# Patient Record
Sex: Female | Born: 1940 | Race: White | Hispanic: No | Marital: Single
Health system: Southern US, Community
[De-identification: ages and names within clinical notes are randomized; demographics above are authoritative.]

## PROBLEM LIST (undated history)

## (undated) DIAGNOSIS — I4891 Unspecified atrial fibrillation: Secondary | ICD-10-CM

## (undated) DIAGNOSIS — I6529 Occlusion and stenosis of unspecified carotid artery: Secondary | ICD-10-CM

## (undated) DIAGNOSIS — F419 Anxiety disorder, unspecified: Secondary | ICD-10-CM

## (undated) DIAGNOSIS — I8393 Asymptomatic varicose veins of bilateral lower extremities: Secondary | ICD-10-CM

## (undated) DIAGNOSIS — Z7901 Long term (current) use of anticoagulants: Secondary | ICD-10-CM

## (undated) DIAGNOSIS — I639 Cerebral infarction, unspecified: Secondary | ICD-10-CM

## (undated) DIAGNOSIS — Z972 Presence of dental prosthetic device (complete) (partial): Secondary | ICD-10-CM

## (undated) DIAGNOSIS — J449 Chronic obstructive pulmonary disease, unspecified: Secondary | ICD-10-CM

## (undated) DIAGNOSIS — M109 Gout, unspecified: Secondary | ICD-10-CM

## (undated) DIAGNOSIS — E119 Type 2 diabetes mellitus without complications: Secondary | ICD-10-CM

## (undated) DIAGNOSIS — I509 Heart failure, unspecified: Secondary | ICD-10-CM

## (undated) DIAGNOSIS — Z974 Presence of external hearing-aid: Secondary | ICD-10-CM

## (undated) DIAGNOSIS — I10 Essential (primary) hypertension: Secondary | ICD-10-CM

## (undated) DIAGNOSIS — N289 Disorder of kidney and ureter, unspecified: Secondary | ICD-10-CM

## (undated) DIAGNOSIS — J40 Bronchitis, not specified as acute or chronic: Secondary | ICD-10-CM

## (undated) DIAGNOSIS — E785 Hyperlipidemia, unspecified: Secondary | ICD-10-CM

## (undated) DIAGNOSIS — Z9889 Other specified postprocedural states: Secondary | ICD-10-CM

## (undated) DIAGNOSIS — K08109 Complete loss of teeth, unspecified cause, unspecified class: Secondary | ICD-10-CM

## (undated) DIAGNOSIS — M1990 Unspecified osteoarthritis, unspecified site: Secondary | ICD-10-CM

## (undated) DIAGNOSIS — R112 Nausea with vomiting, unspecified: Secondary | ICD-10-CM

## (undated) DIAGNOSIS — I679 Cerebrovascular disease, unspecified: Secondary | ICD-10-CM

## (undated) DIAGNOSIS — I2510 Atherosclerotic heart disease of native coronary artery without angina pectoris: Secondary | ICD-10-CM

## (undated) HISTORY — DX: Type 2 diabetes mellitus without complications: E11.9

## (undated) HISTORY — PX: PARTIAL HYSTERECTOMY: SHX80

## (undated) HISTORY — DX: Essential (primary) hypertension: I10

## (undated) HISTORY — PX: CARDIAC CATHETERIZATION: SHX172

## (undated) HISTORY — PX: TOE SURGERY: SHX1073

## (undated) HISTORY — DX: Atherosclerotic heart disease of native coronary artery without angina pectoris: I25.10

---

## 2005-07-20 HISTORY — PX: TOTAL KNEE ARTHROPLASTY: SHX125

## 2006-02-10 ENCOUNTER — Other Ambulatory Visit: Payer: Self-pay

## 2006-03-23 ENCOUNTER — Inpatient Hospital Stay: Payer: Self-pay | Admitting: Unknown Physician Specialty

## 2007-03-15 ENCOUNTER — Ambulatory Visit: Payer: Self-pay | Admitting: Family Medicine

## 2009-07-20 HISTORY — PX: BREAST BIOPSY: SHX20

## 2010-04-11 ENCOUNTER — Ambulatory Visit: Payer: Self-pay | Admitting: Family Medicine

## 2010-06-11 ENCOUNTER — Ambulatory Visit: Payer: Self-pay | Admitting: Family Medicine

## 2011-01-08 ENCOUNTER — Ambulatory Visit: Payer: Self-pay | Admitting: General Surgery

## 2012-02-24 ENCOUNTER — Ambulatory Visit: Payer: Self-pay | Admitting: Family Medicine

## 2012-06-02 ENCOUNTER — Ambulatory Visit: Payer: Self-pay | Admitting: Physical Medicine and Rehabilitation

## 2013-03-01 ENCOUNTER — Ambulatory Visit: Payer: Self-pay | Admitting: Family Medicine

## 2013-11-10 DIAGNOSIS — E119 Type 2 diabetes mellitus without complications: Secondary | ICD-10-CM | POA: Insufficient documentation

## 2013-11-10 DIAGNOSIS — I4891 Unspecified atrial fibrillation: Secondary | ICD-10-CM | POA: Diagnosis present

## 2013-11-10 DIAGNOSIS — I495 Sick sinus syndrome: Secondary | ICD-10-CM | POA: Insufficient documentation

## 2013-11-10 DIAGNOSIS — I10 Essential (primary) hypertension: Secondary | ICD-10-CM | POA: Diagnosis present

## 2013-11-10 HISTORY — DX: Unspecified atrial fibrillation: I48.91

## 2013-11-10 HISTORY — DX: Sick sinus syndrome: I49.5

## 2013-12-01 DIAGNOSIS — M51369 Other intervertebral disc degeneration, lumbar region without mention of lumbar back pain or lower extremity pain: Secondary | ICD-10-CM | POA: Diagnosis present

## 2013-12-01 DIAGNOSIS — M5136 Other intervertebral disc degeneration, lumbar region: Secondary | ICD-10-CM

## 2013-12-01 DIAGNOSIS — M5416 Radiculopathy, lumbar region: Secondary | ICD-10-CM | POA: Insufficient documentation

## 2013-12-01 HISTORY — DX: Other intervertebral disc degeneration, lumbar region without mention of lumbar back pain or lower extremity pain: M51.369

## 2013-12-01 HISTORY — DX: Other intervertebral disc degeneration, lumbar region: M51.36

## 2014-05-01 ENCOUNTER — Ambulatory Visit: Payer: Self-pay | Admitting: Family Medicine

## 2016-10-23 ENCOUNTER — Other Ambulatory Visit: Payer: Self-pay | Admitting: Family Medicine

## 2016-10-23 DIAGNOSIS — Z1231 Encounter for screening mammogram for malignant neoplasm of breast: Secondary | ICD-10-CM

## 2016-11-16 ENCOUNTER — Ambulatory Visit
Admission: RE | Admit: 2016-11-16 | Discharge: 2016-11-16 | Disposition: A | Discharge status: Home / Self Care | Payer: Medicare Other | Source: Ambulatory Visit | Attending: Family Medicine | Admitting: Family Medicine

## 2016-11-16 DIAGNOSIS — Z1231 Encounter for screening mammogram for malignant neoplasm of breast: Secondary | ICD-10-CM | POA: Diagnosis present

## 2017-12-27 ENCOUNTER — Other Ambulatory Visit: Payer: Self-pay | Admitting: Family Medicine

## 2017-12-27 DIAGNOSIS — Z1231 Encounter for screening mammogram for malignant neoplasm of breast: Secondary | ICD-10-CM

## 2018-01-17 ENCOUNTER — Ambulatory Visit
Admission: RE | Admit: 2018-01-17 | Discharge: 2018-01-17 | Disposition: A | Discharge status: Home / Self Care | Payer: Medicare Other | Source: Ambulatory Visit | Attending: Family Medicine | Admitting: Family Medicine

## 2018-01-17 DIAGNOSIS — Z1231 Encounter for screening mammogram for malignant neoplasm of breast: Secondary | ICD-10-CM

## 2019-01-27 ENCOUNTER — Other Ambulatory Visit: Payer: Self-pay | Admitting: Family Medicine

## 2019-01-27 DIAGNOSIS — Z1231 Encounter for screening mammogram for malignant neoplasm of breast: Secondary | ICD-10-CM

## 2019-03-07 ENCOUNTER — Ambulatory Visit
Admission: RE | Admit: 2019-03-07 | Discharge: 2019-03-07 | Disposition: A | Discharge status: Home / Self Care | Payer: Medicare Other | Source: Ambulatory Visit | Attending: Family Medicine | Admitting: Family Medicine

## 2019-03-07 DIAGNOSIS — Z1231 Encounter for screening mammogram for malignant neoplasm of breast: Secondary | ICD-10-CM | POA: Diagnosis not present

## 2019-07-21 DIAGNOSIS — Z9841 Cataract extraction status, right eye: Secondary | ICD-10-CM

## 2019-07-21 HISTORY — DX: Cataract extraction status, right eye: Z98.41

## 2020-02-06 ENCOUNTER — Other Ambulatory Visit: Payer: Self-pay | Admitting: Family Medicine

## 2020-02-06 DIAGNOSIS — Z1231 Encounter for screening mammogram for malignant neoplasm of breast: Secondary | ICD-10-CM

## 2020-03-07 ENCOUNTER — Ambulatory Visit
Admission: RE | Admit: 2020-03-07 | Discharge: 2020-03-07 | Disposition: A | Discharge status: Home / Self Care | Payer: Medicare Other | Source: Ambulatory Visit | Attending: Family Medicine | Admitting: Family Medicine

## 2020-03-07 ENCOUNTER — Other Ambulatory Visit: Payer: Self-pay

## 2020-03-07 DIAGNOSIS — Z1231 Encounter for screening mammogram for malignant neoplasm of breast: Secondary | ICD-10-CM | POA: Diagnosis present

## 2020-03-20 ENCOUNTER — Encounter: Payer: Self-pay | Admitting: Ophthalmology

## 2020-03-20 ENCOUNTER — Other Ambulatory Visit: Payer: Self-pay

## 2020-03-22 ENCOUNTER — Other Ambulatory Visit: Admission: RE | Admit: 2020-03-22 | Payer: Medicare Other | Source: Ambulatory Visit

## 2020-03-26 ENCOUNTER — Other Ambulatory Visit: Payer: Self-pay

## 2020-03-26 ENCOUNTER — Other Ambulatory Visit
Admission: RE | Admit: 2020-03-26 | Discharge: 2020-03-26 | Disposition: A | Discharge status: Home / Self Care | Payer: Medicare Other | Source: Ambulatory Visit | Attending: Ophthalmology | Admitting: Ophthalmology

## 2020-03-26 DIAGNOSIS — Z01812 Encounter for preprocedural laboratory examination: Secondary | ICD-10-CM | POA: Insufficient documentation

## 2020-03-26 DIAGNOSIS — Z20822 Contact with and (suspected) exposure to covid-19: Secondary | ICD-10-CM | POA: Diagnosis not present

## 2020-03-26 NOTE — Discharge Instructions (Signed)

## 2020-03-27 ENCOUNTER — Encounter: Payer: Self-pay | Admitting: Ophthalmology

## 2020-03-27 ENCOUNTER — Other Ambulatory Visit: Payer: Self-pay

## 2020-03-27 ENCOUNTER — Ambulatory Visit: Payer: Medicare Other | Admitting: Anesthesiology

## 2020-03-27 ENCOUNTER — Ambulatory Visit
Admission: RE | Admit: 2020-03-27 | Discharge: 2020-03-27 | Disposition: A | Discharge status: Home / Self Care | Payer: Medicare Other | Attending: Ophthalmology | Admitting: Ophthalmology

## 2020-03-27 ENCOUNTER — Encounter
Admission: RE | Disposition: A | Discharge status: Home / Self Care | Payer: Self-pay | Source: Home / Self Care | Attending: Ophthalmology

## 2020-03-27 DIAGNOSIS — M1990 Unspecified osteoarthritis, unspecified site: Secondary | ICD-10-CM | POA: Insufficient documentation

## 2020-03-27 DIAGNOSIS — Z79899 Other long term (current) drug therapy: Secondary | ICD-10-CM | POA: Insufficient documentation

## 2020-03-27 DIAGNOSIS — Z96659 Presence of unspecified artificial knee joint: Secondary | ICD-10-CM | POA: Insufficient documentation

## 2020-03-27 DIAGNOSIS — Z7982 Long term (current) use of aspirin: Secondary | ICD-10-CM | POA: Insufficient documentation

## 2020-03-27 DIAGNOSIS — I10 Essential (primary) hypertension: Secondary | ICD-10-CM | POA: Insufficient documentation

## 2020-03-27 DIAGNOSIS — H2512 Age-related nuclear cataract, left eye: Secondary | ICD-10-CM | POA: Diagnosis present

## 2020-03-27 DIAGNOSIS — Z87891 Personal history of nicotine dependence: Secondary | ICD-10-CM | POA: Insufficient documentation

## 2020-03-27 HISTORY — DX: Unspecified atrial fibrillation: I48.91

## 2020-03-27 HISTORY — DX: Unspecified osteoarthritis, unspecified site: M19.90

## 2020-03-27 HISTORY — DX: Presence of external hearing-aid: Z97.4

## 2020-03-27 HISTORY — DX: Gout, unspecified: M10.9

## 2020-03-27 HISTORY — DX: Asymptomatic varicose veins of bilateral lower extremities: I83.93

## 2020-03-27 HISTORY — PX: CATARACT EXTRACTION W/PHACO: SHX586

## 2020-03-27 HISTORY — DX: Bronchitis, not specified as acute or chronic: J40

## 2020-03-27 HISTORY — DX: Presence of dental prosthetic device (complete) (partial): Z97.2

## 2020-03-27 LAB — SARS CORONAVIRUS 2 (TAT 6-24 HRS): SARS Coronavirus 2: NEGATIVE

## 2020-03-27 SURGERY — PHACOEMULSIFICATION, CATARACT, WITH IOL INSERTION
Anesthesia: Monitor Anesthesia Care | Site: Eye | Laterality: Left

## 2020-03-27 MED ORDER — BRIMONIDINE TARTRATE-TIMOLOL 0.2-0.5 % OP SOLN
OPHTHALMIC | Status: DC | PRN
  Administered 2020-03-27: 1 [drp] via OPHTHALMIC

## 2020-03-27 MED ORDER — TETRACAINE HCL 0.5 % OP SOLN
1.0000 [drp] | OPHTHALMIC | Status: DC | PRN
  Administered 2020-03-27 (×3): 1 [drp] via OPHTHALMIC

## 2020-03-27 MED ORDER — LIDOCAINE HCL (PF) 2 % IJ SOLN
INTRAOCULAR | Status: DC | PRN
  Administered 2020-03-27: 1 mL

## 2020-03-27 MED ORDER — NA HYALUR & NA CHOND-NA HYALUR 0.4-0.35 ML IO KIT
PACK | INTRAOCULAR | Status: DC | PRN
  Administered 2020-03-27: 1 mL via INTRAOCULAR

## 2020-03-27 MED ORDER — EPINEPHRINE PF 1 MG/ML IJ SOLN
INTRAOCULAR | Status: DC | PRN
  Administered 2020-03-27: 59 mL via OPHTHALMIC

## 2020-03-27 MED ORDER — MOXIFLOXACIN HCL 0.5 % OP SOLN
1.0000 [drp] | OPHTHALMIC | Status: DC | PRN
  Administered 2020-03-27 (×3): 1 [drp] via OPHTHALMIC

## 2020-03-27 MED ORDER — MOXIFLOXACIN HCL 0.5 % OP SOLN
OPHTHALMIC | Status: DC | PRN
  Administered 2020-03-27: 0.2 mL via OPHTHALMIC

## 2020-03-27 MED ORDER — FENTANYL CITRATE (PF) 100 MCG/2ML IJ SOLN
INTRAMUSCULAR | Status: DC | PRN
Start: 2020-03-27 — End: 2020-03-27
  Administered 2020-03-27: 50 ug via INTRAVENOUS

## 2020-03-27 MED ORDER — ARMC OPHTHALMIC DILATING DROPS
1.0000 "application " | OPHTHALMIC | Status: DC | PRN
  Administered 2020-03-27 (×3): 1 via OPHTHALMIC

## 2020-03-27 MED ORDER — LACTATED RINGERS IV SOLN: INTRAVENOUS | Status: DC

## 2020-03-27 MED ORDER — MIDAZOLAM HCL 2 MG/2ML IJ SOLN
INTRAMUSCULAR | Status: DC | PRN
  Administered 2020-03-27: 1.5 mg via INTRAVENOUS

## 2020-03-27 SURGICAL SUPPLY — 23 items
CANNULA ANT/CHMB 27G (MISCELLANEOUS) ×1 IMPLANT
CANNULA ANT/CHMB 27GA (MISCELLANEOUS) ×3 IMPLANT
GLOVE SURG LX 7.5 STRW (GLOVE) ×2
GLOVE SURG LX STRL 7.5 STRW (GLOVE) ×1 IMPLANT
GLOVE SURG TRIUMPH 8.0 PF LTX (GLOVE) ×3 IMPLANT
GOWN STRL REUS W/ TWL LRG LVL3 (GOWN DISPOSABLE) ×2 IMPLANT
GOWN STRL REUS W/TWL LRG LVL3 (GOWN DISPOSABLE) ×6
LENS IOL DIOP 23.5 (Intraocular Lens) ×3 IMPLANT
LENS IOL TECNIS MONO 23.5 (Intraocular Lens) IMPLANT
MARKER SKIN DUAL TIP RULER LAB (MISCELLANEOUS) ×3 IMPLANT
NDL CAPSULORHEX 25GA (NEEDLE) ×1 IMPLANT
NDL FILTER BLUNT 18X1 1/2 (NEEDLE) ×2 IMPLANT
NEEDLE CAPSULORHEX 25GA (NEEDLE) ×3 IMPLANT
NEEDLE FILTER BLUNT 18X 1/2SAF (NEEDLE) ×4
NEEDLE FILTER BLUNT 18X1 1/2 (NEEDLE) ×2 IMPLANT
PACK CATARACT BRASINGTON (MISCELLANEOUS) ×3 IMPLANT
PACK EYE AFTER SURG (MISCELLANEOUS) ×3 IMPLANT
PACK OPTHALMIC (MISCELLANEOUS) ×3 IMPLANT
SOLUTION OPHTHALMIC SALT (MISCELLANEOUS) ×3 IMPLANT
SYR 3ML LL SCALE MARK (SYRINGE) ×6 IMPLANT
SYR TB 1ML LUER SLIP (SYRINGE) ×3 IMPLANT
WATER STERILE IRR 250ML POUR (IV SOLUTION) ×3 IMPLANT
WIPE NON LINTING 3.25X3.25 (MISCELLANEOUS) ×3 IMPLANT

## 2020-03-27 NOTE — Anesthesia Procedure Notes (Signed)
Procedure Name: MAC Performed by: Delman Goshorn, CRNA Pre-anesthesia Checklist: Patient identified, Emergency Drugs available, Suction available, Timeout performed and Patient being monitored Patient Re-evaluated:Patient Re-evaluated prior to induction Oxygen Delivery Method: Nasal cannula Placement Confirmation: positive ETCO2       

## 2020-03-27 NOTE — H&P (Signed)

## 2020-03-27 NOTE — Op Note (Signed)
OPERATIVE NOTE  VELVA MOLINARI [id number] 03/27/2020   PREOPERATIVE DIAGNOSIS:  Nuclear sclerotic cataract left eye. H25.12   POSTOPERATIVE DIAGNOSIS:    Nuclear sclerotic cataract left eye.     PROCEDURE:  Phacoemusification with posterior chamber intraocular lens placement of the left eye  Ultrasound time: Procedure(s): CATARACT EXTRACTION PHACO AND INTRAOCULAR LENS PLACEMENT (IOC) LEFT 7.49  00:56.4  13.3% (Left)  LENS:   Implant Name Type Inv. Item Serial No. Manufacturer Lot No. LRB No. Used Action  LENS IOL DIOP 23.5 - [id number] Intraocular Lens LENS IOL DIOP 23.5 [id number] [employer]  Left 1 Implanted      SURGEON:  Deirdre Evener, MD   ANESTHESIA:  Topical with tetracaine drops and 2% Xylocaine jelly, augmented with 1% preservative-free intracameral lidocaine.    COMPLICATIONS:  None.   DESCRIPTION OF PROCEDURE:  The patient was identified in the holding room and transported to the operating room and placed in the supine position under the operating microscope.  The left eye was identified as the operative eye and it was prepped and draped in the usual sterile ophthalmic fashion.   A 1 millimeter clear-corneal paracentesis was made at the 1:30 position.  0.5 ml of preservative-free 1% lidocaine was injected into the anterior chamber.  The anterior chamber was filled with Viscoat viscoelastic.  A 2.4 millimeter keratome was used to make a near-clear corneal incision at the 10:30 position.  .  A curvilinear capsulorrhexis was made with a cystotome and capsulorrhexis forceps.  Balanced salt solution was used to hydrodissect and hydrodelineate the nucleus.   Phacoemulsification was then used in stop and chop fashion to remove the lens nucleus and epinucleus.  The remaining cortex was then removed using the irrigation and aspiration handpiece. Provisc was then placed into the capsular bag to distend it for lens placement.  A lens was then injected into the  capsular bag.  The remaining viscoelastic was aspirated.   Wounds were hydrated with balanced salt solution.  The anterior chamber was inflated to a physiologic pressure with balanced salt solution.  No wound leaks were noted. Vigamox 0.2 ml of a 1mg  per ml solution was injected into the anterior chamber for a dose of 0.2 mg of intracameral antibiotic at the completion of the case.   Timolol and Brimonidine drops were applied to the eye.  The patient was taken to the recovery room in stable condition without complications of anesthesia or surgery.  Chalet Kerwin 03/27/2020, 8:59 AM

## 2020-03-27 NOTE — Anesthesia Postprocedure Evaluation (Signed)
Anesthesia Post Note  Patient: Brooke Davis  Procedure(s) Performed: CATARACT EXTRACTION PHACO AND INTRAOCULAR LENS PLACEMENT (IOC) LEFT 7.49  00:56.4  13.3% (Left Eye)     Anesthesia Post Evaluation No complications documented.  Naveyah Iacovelli [employer]

## 2020-03-27 NOTE — Anesthesia Preprocedure Evaluation (Signed)
Anesthesia Evaluation  Patient identified by MRN, date of birth, ID band Patient awake    Reviewed: Allergy & Precautions, NPO status , Patient's Chart, lab work & pertinent test results  Airway Mallampati: II  TM Distance: >3 FB Neck ROM: Full    Dental  (+) Upper Dentures, Lower Dentures   Pulmonary former smoker,    Pulmonary exam normal        Cardiovascular hypertension, Normal cardiovascular exam     Neuro/Psych Cataract negative psych ROS   GI/Hepatic negative GI ROS, Neg liver ROS,   Endo/Other  BMI 36  Renal/GU negative Renal ROS     Musculoskeletal  (+) Arthritis ,   Abdominal (+) + obese,   Peds  Hematology negative hematology ROS (+)   Anesthesia Other Findings   Reproductive/Obstetrics negative OB ROS                             Anesthesia Physical Anesthesia Plan  ASA: II  Anesthesia Plan: MAC   Post-op Pain Management:    Induction: Intravenous  PONV Risk Score and Plan: 2 and TIVA, Midazolam and Treatment may vary due to age or medical condition  Airway Management Planned: Natural Airway and Nasal Cannula  Additional Equipment: None  Intra-op Plan:   Post-operative Plan:   Informed Consent: I have reviewed the patients History and Physical, chart, labs and discussed the procedure including the risks, benefits and alternatives for the proposed anesthesia with the patient or authorized representative who has indicated his/her understanding and acceptance.     Dental advisory given  Plan Discussed with: CRNA  Anesthesia Plan Comments:         Anesthesia Quick Evaluation

## 2020-03-27 NOTE — Transfer of Care (Signed)
Immediate Anesthesia Transfer of Care Note  Patient: Brooke Davis  Procedure(s) Performed: CATARACT EXTRACTION PHACO AND INTRAOCULAR LENS PLACEMENT (IOC) LEFT 7.49  00:56.4  13.3% (Left Eye)  Patient Location: PACU  Anesthesia Type: MAC  Level of Consciousness: awake, alert  and patient cooperative  Airway and Oxygen Therapy: Patient Spontanous Breathing and Patient connected to supplemental oxygen  Post-op Assessment: Post-op Vital signs reviewed, Patient's Cardiovascular Status Stable, Respiratory Function Stable, Patent Airway and No signs of Nausea or vomiting  Post-op Vital Signs: Reviewed and stable  Complications: No complications documented.

## 2020-03-28 ENCOUNTER — Encounter: Payer: Self-pay | Admitting: Ophthalmology

## 2020-04-15 ENCOUNTER — Other Ambulatory Visit: Payer: Self-pay

## 2020-04-15 ENCOUNTER — Encounter: Payer: Self-pay | Admitting: Ophthalmology

## 2020-04-15 NOTE — Anesthesia Preprocedure Evaluation (Addendum)
Anesthesia Evaluation  Patient identified by MRN, date of birth, ID band Patient awake    Reviewed: Allergy & Precautions, NPO status , Patient's Chart, lab work & pertinent test results  History of Anesthesia Complications Negative for: history of anesthetic complications (nausea after last cataract)  Airway Mallampati: IV   Neck ROM: Full    Dental  (+) Lower Dentures, Upper Dentures   Pulmonary former smoker (quit 1980),    Pulmonary exam normal breath sounds clear to auscultation       Cardiovascular hypertension, Normal cardiovascular exam+ dysrhythmias (a fib)  Rhythm:Regular Rate:Normal     Neuro/Psych negative neurological ROS     GI/Hepatic negative GI ROS,   Endo/Other  diabetes, Type 2Obesity   Renal/GU negative Renal ROS     Musculoskeletal  (+) Arthritis ,   Abdominal   Peds  Hematology negative hematology ROS (+)   Anesthesia Other Findings Gout   Reproductive/Obstetrics                            Anesthesia Physical Anesthesia Plan  ASA: III  Anesthesia Plan: MAC   Post-op Pain Management:    Induction: Intravenous  PONV Risk Score and Plan: 2 and TIVA, Midazolam and Treatment may vary due to age or medical condition  Airway Management Planned: Nasal Cannula  Additional Equipment:   Intra-op Plan:   Post-operative Plan:   Informed Consent: I have reviewed the patients History and Physical, chart, labs and discussed the procedure including the risks, benefits and alternatives for the proposed anesthesia with the patient or authorized representative who has indicated his/her understanding and acceptance.       Plan Discussed with: CRNA  Anesthesia Plan Comments:        Anesthesia Quick Evaluation

## 2020-04-22 ENCOUNTER — Other Ambulatory Visit
Admission: RE | Admit: 2020-04-22 | Discharge: 2020-04-22 | Disposition: A | Discharge status: Home / Self Care | Payer: Medicare Other | Source: Ambulatory Visit | Attending: Ophthalmology | Admitting: Ophthalmology

## 2020-04-22 ENCOUNTER — Other Ambulatory Visit: Payer: Self-pay

## 2020-04-22 DIAGNOSIS — Z20822 Contact with and (suspected) exposure to covid-19: Secondary | ICD-10-CM | POA: Insufficient documentation

## 2020-04-22 DIAGNOSIS — Z01812 Encounter for preprocedural laboratory examination: Secondary | ICD-10-CM | POA: Insufficient documentation

## 2020-04-22 LAB — SARS CORONAVIRUS 2 (TAT 6-24 HRS): SARS Coronavirus 2: NEGATIVE

## 2020-04-22 NOTE — Discharge Instructions (Signed)

## 2020-04-24 ENCOUNTER — Encounter
Admission: RE | Disposition: A | Discharge status: Home / Self Care | Payer: Self-pay | Source: Home / Self Care | Attending: Ophthalmology

## 2020-04-24 ENCOUNTER — Other Ambulatory Visit: Payer: Self-pay

## 2020-04-24 ENCOUNTER — Encounter: Payer: Self-pay | Admitting: Ophthalmology

## 2020-04-24 ENCOUNTER — Ambulatory Visit: Payer: Medicare Other | Admitting: Anesthesiology

## 2020-04-24 ENCOUNTER — Ambulatory Visit
Admission: RE | Admit: 2020-04-24 | Discharge: 2020-04-24 | Disposition: A | Discharge status: Home / Self Care | Payer: Medicare Other | Attending: Ophthalmology | Admitting: Ophthalmology

## 2020-04-24 DIAGNOSIS — M1611 Unilateral primary osteoarthritis, right hip: Secondary | ICD-10-CM | POA: Diagnosis not present

## 2020-04-24 DIAGNOSIS — E1136 Type 2 diabetes mellitus with diabetic cataract: Secondary | ICD-10-CM | POA: Insufficient documentation

## 2020-04-24 DIAGNOSIS — Z87891 Personal history of nicotine dependence: Secondary | ICD-10-CM | POA: Diagnosis not present

## 2020-04-24 DIAGNOSIS — H2511 Age-related nuclear cataract, right eye: Secondary | ICD-10-CM | POA: Diagnosis not present

## 2020-04-24 DIAGNOSIS — I4891 Unspecified atrial fibrillation: Secondary | ICD-10-CM | POA: Insufficient documentation

## 2020-04-24 DIAGNOSIS — I10 Essential (primary) hypertension: Secondary | ICD-10-CM | POA: Insufficient documentation

## 2020-04-24 DIAGNOSIS — Z791 Long term (current) use of non-steroidal anti-inflammatories (NSAID): Secondary | ICD-10-CM | POA: Insufficient documentation

## 2020-04-24 DIAGNOSIS — M19049 Primary osteoarthritis, unspecified hand: Secondary | ICD-10-CM | POA: Insufficient documentation

## 2020-04-24 DIAGNOSIS — Z880 Allergy status to penicillin: Secondary | ICD-10-CM | POA: Diagnosis not present

## 2020-04-24 HISTORY — PX: CATARACT EXTRACTION W/PHACO: SHX586

## 2020-04-24 SURGERY — PHACOEMULSIFICATION, CATARACT, WITH IOL INSERTION
Anesthesia: Monitor Anesthesia Care | Site: Eye | Laterality: Right

## 2020-04-24 MED ORDER — TETRACAINE HCL 0.5 % OP SOLN
1.0000 [drp] | OPHTHALMIC | Status: DC | PRN
  Administered 2020-04-24 (×3): 1 [drp] via OPHTHALMIC

## 2020-04-24 MED ORDER — MOXIFLOXACIN HCL 0.5 % OP SOLN
OPHTHALMIC | Status: DC | PRN
  Administered 2020-04-24: 0.2 mL via OPHTHALMIC

## 2020-04-24 MED ORDER — LACTATED RINGERS IV SOLN: INTRAVENOUS | Status: DC

## 2020-04-24 MED ORDER — ONDANSETRON HCL 4 MG/2ML IJ SOLN: 4.0000 mg | Freq: Once | INTRAMUSCULAR | Status: DC | PRN

## 2020-04-24 MED ORDER — FENTANYL CITRATE (PF) 100 MCG/2ML IJ SOLN
INTRAMUSCULAR | Status: DC | PRN
  Administered 2020-04-24: 50 ug via INTRAVENOUS

## 2020-04-24 MED ORDER — BRIMONIDINE TARTRATE-TIMOLOL 0.2-0.5 % OP SOLN
OPHTHALMIC | Status: DC | PRN
  Administered 2020-04-24: 1 [drp] via OPHTHALMIC

## 2020-04-24 MED ORDER — EPINEPHRINE PF 1 MG/ML IJ SOLN
INTRAOCULAR | Status: DC | PRN
  Administered 2020-04-24: 57 mL via OPHTHALMIC

## 2020-04-24 MED ORDER — ARMC OPHTHALMIC DILATING DROPS
1.0000 "application " | OPHTHALMIC | Status: DC | PRN
  Administered 2020-04-24 (×3): 1 via OPHTHALMIC

## 2020-04-24 MED ORDER — LIDOCAINE HCL (PF) 2 % IJ SOLN
INTRAOCULAR | Status: DC | PRN
  Administered 2020-04-24: 1 mL

## 2020-04-24 MED ORDER — MIDAZOLAM HCL 2 MG/2ML IJ SOLN
INTRAMUSCULAR | Status: DC | PRN
  Administered 2020-04-24: 1 mg via INTRAVENOUS

## 2020-04-24 MED ORDER — NA HYALUR & NA CHOND-NA HYALUR 0.4-0.35 ML IO KIT
PACK | INTRAOCULAR | Status: DC | PRN
  Administered 2020-04-24: 1 mL via INTRAOCULAR

## 2020-04-24 MED ORDER — ACETAMINOPHEN 160 MG/5ML PO SOLN: 325.0000 mg | ORAL | Status: DC | PRN

## 2020-04-24 MED ORDER — MOXIFLOXACIN HCL 0.5 % OP SOLN
1.0000 [drp] | OPHTHALMIC | Status: DC | PRN
  Administered 2020-04-24 (×3): 1 [drp] via OPHTHALMIC

## 2020-04-24 MED ORDER — ACETAMINOPHEN 325 MG PO TABS: 650.0000 mg | ORAL_TABLET | Freq: Once | ORAL | Status: DC | PRN

## 2020-04-24 SURGICAL SUPPLY — 29 items
CANNULA ANT/CHMB 27G (MISCELLANEOUS) ×1 IMPLANT
CANNULA ANT/CHMB 27GA (MISCELLANEOUS) ×3 IMPLANT
GLOVE SURG LX 7.5 STRW (GLOVE) ×4
GLOVE SURG LX STRL 7.5 STRW (GLOVE) ×1 IMPLANT
GLOVE SURG TRIUMPH 8.0 PF LTX (GLOVE) ×3 IMPLANT
GOWN STRL REUS W/ TWL LRG LVL3 (GOWN DISPOSABLE) ×2 IMPLANT
GOWN STRL REUS W/TWL LRG LVL3 (GOWN DISPOSABLE) ×6
LENS IOL DIOP 23.5 (Intraocular Lens) ×3 IMPLANT
LENS IOL TECNIS MONO 23.5 (Intraocular Lens) IMPLANT
MARKER SKIN DUAL TIP RULER LAB (MISCELLANEOUS) ×3 IMPLANT
NDL CAPSULORHEX 25GA (NEEDLE) ×1 IMPLANT
NDL FILTER BLUNT 18X1 1/2 (NEEDLE) ×2 IMPLANT
NDL RETROBULBAR .5 NSTRL (NEEDLE) IMPLANT
NEEDLE CAPSULORHEX 25GA (NEEDLE) ×3 IMPLANT
NEEDLE FILTER BLUNT 18X 1/2SAF (NEEDLE) ×4
NEEDLE FILTER BLUNT 18X1 1/2 (NEEDLE) ×2 IMPLANT
PACK CATARACT BRASINGTON (MISCELLANEOUS) ×3 IMPLANT
PACK EYE AFTER SURG (MISCELLANEOUS) ×3 IMPLANT
PACK OPTHALMIC (MISCELLANEOUS) ×3 IMPLANT
RING MALYGIN 7.0 (MISCELLANEOUS) IMPLANT
SOLUTION OPHTHALMIC SALT (MISCELLANEOUS) ×3 IMPLANT
SUT ETHILON 10-0 CS-B-6CS-B-6 (SUTURE)
SUT VICRYL  9 0 (SUTURE)
SUT VICRYL 9 0 (SUTURE) IMPLANT
SUTURE EHLN 10-0 CS-B-6CS-B-6 (SUTURE) IMPLANT
SYR 3ML LL SCALE MARK (SYRINGE) ×6 IMPLANT
SYR TB 1ML LUER SLIP (SYRINGE) ×3 IMPLANT
WATER STERILE IRR 250ML POUR (IV SOLUTION) ×3 IMPLANT
WIPE NON LINTING 3.25X3.25 (MISCELLANEOUS) ×3 IMPLANT

## 2020-04-24 NOTE — H&P (Signed)
Brooke Davis   Primary Care Physician:  Brooke Mina, MD Ophthalmologist: Dr. Lockie Davis  Pre-Procedure History & Physical: HPI:  Brooke Davis is a 79 y.o. female here for ophthalmic surgery.   Past Medical History:  Diagnosis Date  . Arthritis    fingers, right hip  . Atrial fibrillation (HCC)   . Bronchitis   . Gout   . Hypertension   . Varicose veins of legs   . Wears dentures    full upper and lower  . Wears hearing aid in both ears     Past Surgical History:  Procedure Laterality Date  . BREAST BIOPSY Left 2011   NEG  . CARDIAC CATHETERIZATION     over 20 yrs ago.  All OK.  Marland Kitchen CATARACT EXTRACTION W/PHACO Left 03/27/2020   Procedure: CATARACT EXTRACTION PHACO AND INTRAOCULAR LENS PLACEMENT (IOC) LEFT 7.49  00:56.4  13.3%;  Surgeon: Brooke Mola, MD;  Location: [facility];  Service: Ophthalmology;  Laterality: Left;  . PARTIAL HYSTERECTOMY      Prior to Admission medications   Medication Sig Start Date End Date Taking? Authorizing Provider  acetaminophen (TYLENOL) 500 MG tablet Take 500 mg by mouth every 6 (six) hours as needed.   Yes [provider]  amLODipine (NORVASC) 5 MG tablet Take 5 mg by mouth daily.   Yes [provider]  aspirin EC 81 MG tablet Take 81 mg by mouth daily. Swallow whole.   Yes [provider]  meloxicam (MOBIC) 15 MG tablet Take 15 mg by mouth daily.   Yes [provider]  probenecid (BENEMID) 500 MG tablet Take 500 mg by mouth 2 (two) times daily.   Yes [provider]  traMADol (ULTRAM) 50 MG tablet Take by mouth every 6 (six) hours as needed.   Yes [provider]  triamterene-hydrochlorothiazide (DYAZIDE) 37.5-25 MG capsule Take 1 capsule by mouth daily.   Yes [provider]    Allergies as of 03/29/2020 - Review Complete 03/27/2020  Allergen Reaction Noted  . Celebrex [celecoxib] Diarrhea 03/20/2020  . Mobic [meloxicam] Diarrhea  03/20/2020  . Penicillins Itching 03/20/2020  . Relafen [nabumetone]  03/27/2020    Family History  Problem Relation Age of Onset  . Breast cancer Neg Hx     Social History   Socioeconomic History  . Marital status: Married    Spouse name: Not on file  . Number of children: Not on file  . Years of education: Not on file  . Highest education level: Not on file  Occupational History  . Not on file  Tobacco Use  . Smoking status: Former Smoker    Types: Cigarettes    Quit date: 1980    Years since quitting: 41.7  . Smokeless tobacco: Never Used  Vaping Use  . Vaping Use: Never used  Substance and Sexual Activity  . Alcohol use: Never  . Drug use: Not on file  . Sexual activity: Not on file  Other Topics Concern  . Not on file  Social History Narrative  . Not on file   Social Determinants of Health   Financial Resource Strain:   . Difficulty of Paying Living Expenses: Not on file  Food Insecurity:   . Worried About Programme researcher, broadcasting/film/video in the Last Year: Not on file  . Ran Out of Food in the Last Year: Not on file  Transportation Needs:   . Lack of Transportation (Medical): Not on file  . Lack of  Transportation (Non-Medical): Not on file  Physical Activity:   . Days of Exercise per Week: Not on file  . Minutes of Exercise per Session: Not on file  Stress:   . Feeling of Stress : Not on file  Social Connections:   . Frequency of Communication with Friends and Family: Not on file  . Frequency of Social Gatherings with Friends and Family: Not on file  . Attends Religious Services: Not on file  . Active Member of Clubs or Organizations: Not on file  . Attends Banker Meetings: Not on file  . Marital Status: Not on file  Intimate Partner Violence:   . Fear of Current or Ex-Partner: Not on file  . Emotionally Abused: Not on file  . Physically Abused: Not on file  . Sexually Abused: Not on file    Review of Systems: See HPI, otherwise negative  ROS  Physical Exam: BP (!) 156/66   Pulse 82   Temp (!) 97 F (36.1 C) (Temporal)   Resp 18   Ht 5\' 3"  (1.6 m)   Wt 94.8 kg   SpO2 100%   BMI 37.02 kg/m  General:   Alert,  pleasant and cooperative in NAD Head:  Normocephalic and atraumatic. Lungs:  Clear to auscultation.    Heart:  Regular rate and rhythm.   Impression/Plan: is here for ophthalmic surgery.  Risks, benefits, limitations, and alternatives regarding ophthalmic surgery have been reviewed with the patient.  Questions have been answered.  All parties agreeable.   Brooke Cunas, MD  04/24/2020, 7:35 AM

## 2020-04-24 NOTE — Anesthesia Procedure Notes (Signed)
Procedure Name: MAC Performed by: Layani Foronda, CRNA Pre-anesthesia Checklist: Patient identified, Emergency Drugs available, Suction available, Timeout performed and Patient being monitored Patient Re-evaluated:Patient Re-evaluated prior to induction Oxygen Delivery Method: Nasal cannula Placement Confirmation: positive ETCO2       

## 2020-04-24 NOTE — Op Note (Signed)
LOCATION:  [facility]   PREOPERATIVE DIAGNOSIS:    Nuclear sclerotic cataract right eye. H25.11   POSTOPERATIVE DIAGNOSIS:  Nuclear sclerotic cataract right eye.     PROCEDURE:  Phacoemusification with posterior chamber intraocular lens placement of the right eye   ULTRASOUND TIME: Procedure(s) with comments: CATARACT EXTRACTION PHACO AND INTRAOCULAR LENS PLACEMENT (IOC) RIGHT (Right) - 8.34 0:50.2 16.6%  LENS:   Implant Name Type Inv. Item Serial No. Manufacturer Lot No. LRB No. Used Action  LENS IOL DIOP 23.5 - [id number] Intraocular Lens LENS IOL DIOP 23.5 [id number]   Right 1 Implanted         SURGEON:  Deirdre Evener, MD   ANESTHESIA:  Topical with tetracaine drops and 2% Xylocaine jelly, augmented with 1% preservative-free intracameral lidocaine.    COMPLICATIONS:  None.   DESCRIPTION OF PROCEDURE:  The patient was identified in the holding room and transported to the operating room and placed in the supine position under the operating microscope.  The right eye was identified as the operative eye and it was prepped and draped in the usual sterile ophthalmic fashion.   A 1 millimeter clear-corneal paracentesis was made at the 12:00 position.  0.5 ml of preservative-free 1% lidocaine was injected into the anterior chamber. The anterior chamber was filled with Viscoat viscoelastic.  A 2.4 millimeter keratome was used to make a near-clear corneal incision at the 9:00 position.  A curvilinear capsulorrhexis was made with a cystotome and capsulorrhexis forceps.  Balanced salt solution was used to hydrodissect and hydrodelineate the nucleus.   Phacoemulsification was then used in stop and chop fashion to remove the lens nucleus and epinucleus.  The remaining cortex was then removed using the irrigation and aspiration handpiece. Provisc was then placed into the capsular bag to distend it for lens placement.  A lens was then injected into the capsular bag.   The remaining viscoelastic was aspirated.   Wounds were hydrated with balanced salt solution.  The anterior chamber was inflated to a physiologic pressure with balanced salt solution.  No wound leaks were noted. Vigamox 0.2 ml of a 1mg  per ml solution was injected into the anterior chamber for a dose of 0.2 mg of intracameral antibiotic at the completion of the case.   Timolol and Brimonidine drops were applied to the eye.  The patient was taken to the recovery room in stable condition without complications of anesthesia or surgery.   Kadien Lineman 04/24/2020, 8:35 AM

## 2020-04-24 NOTE — Anesthesia Postprocedure Evaluation (Signed)
Anesthesia Post Note  Patient: Brooke Davis  Procedure(s) Performed: CATARACT EXTRACTION PHACO AND INTRAOCULAR LENS PLACEMENT (IOC) RIGHT (Right Eye)     Patient location during evaluation: PACU Anesthesia Type: MAC Level of consciousness: awake and alert, oriented and patient cooperative Pain management: pain level controlled Vital Signs Assessment: post-procedure vital signs reviewed and stable Respiratory status: spontaneous breathing, nonlabored ventilation and respiratory function stable Cardiovascular status: blood pressure returned to baseline and stable Postop Assessment: adequate PO intake Anesthetic complications: no   No complications documented.  Darrin Nipper

## 2020-04-24 NOTE — Transfer of Care (Signed)
Immediate Anesthesia Transfer of Care Note  Patient: Brooke Davis  Procedure(s) Performed: CATARACT EXTRACTION PHACO AND INTRAOCULAR LENS PLACEMENT (IOC) RIGHT (Right Eye)  Patient Location: PACU  Anesthesia Type: MAC  Level of Consciousness: awake, alert  and patient cooperative  Airway and Oxygen Therapy: Patient Spontanous Breathing and Patient connected to supplemental oxygen  Post-op Assessment: Post-op Vital signs reviewed, Patient's Cardiovascular Status Stable, Respiratory Function Stable, Patent Airway and No signs of Nausea or vomiting  Post-op Vital Signs: Reviewed and stable  Complications: No complications documented.

## 2021-01-11 ENCOUNTER — Encounter
Admission: EM | Disposition: A | Discharge status: Home / Self Care | Payer: Self-pay | Source: Home / Self Care | Attending: Internal Medicine

## 2021-01-11 ENCOUNTER — Encounter: Payer: Self-pay | Admitting: Cardiology

## 2021-01-11 ENCOUNTER — Inpatient Hospital Stay
Admission: EM | Admit: 2021-01-11 | Discharge: 2021-01-13 | DRG: 247 | Disposition: A | Discharge status: Home / Self Care | Payer: Medicare Other | Attending: Internal Medicine | Admitting: Internal Medicine

## 2021-01-11 ENCOUNTER — Other Ambulatory Visit: Payer: Self-pay

## 2021-01-11 DIAGNOSIS — M109 Gout, unspecified: Secondary | ICD-10-CM | POA: Diagnosis present

## 2021-01-11 DIAGNOSIS — Z79891 Long term (current) use of opiate analgesic: Secondary | ICD-10-CM

## 2021-01-11 DIAGNOSIS — Z888 Allergy status to other drugs, medicaments and biological substances status: Secondary | ICD-10-CM

## 2021-01-11 DIAGNOSIS — I25110 Atherosclerotic heart disease of native coronary artery with unstable angina pectoris: Secondary | ICD-10-CM | POA: Diagnosis present

## 2021-01-11 DIAGNOSIS — Z79899 Other long term (current) drug therapy: Secondary | ICD-10-CM | POA: Diagnosis not present

## 2021-01-11 DIAGNOSIS — I2119 ST elevation (STEMI) myocardial infarction involving other coronary artery of inferior wall: Secondary | ICD-10-CM | POA: Diagnosis present

## 2021-01-11 DIAGNOSIS — Z7982 Long term (current) use of aspirin: Secondary | ICD-10-CM

## 2021-01-11 DIAGNOSIS — R197 Diarrhea, unspecified: Secondary | ICD-10-CM | POA: Diagnosis present

## 2021-01-11 DIAGNOSIS — I213 ST elevation (STEMI) myocardial infarction of unspecified site: Secondary | ICD-10-CM

## 2021-01-11 DIAGNOSIS — I4891 Unspecified atrial fibrillation: Secondary | ICD-10-CM | POA: Diagnosis not present

## 2021-01-11 DIAGNOSIS — Z974 Presence of external hearing-aid: Secondary | ICD-10-CM | POA: Diagnosis not present

## 2021-01-11 DIAGNOSIS — I129 Hypertensive chronic kidney disease with stage 1 through stage 4 chronic kidney disease, or unspecified chronic kidney disease: Secondary | ICD-10-CM | POA: Diagnosis present

## 2021-01-11 DIAGNOSIS — Z833 Family history of diabetes mellitus: Secondary | ICD-10-CM

## 2021-01-11 DIAGNOSIS — I482 Chronic atrial fibrillation, unspecified: Secondary | ICD-10-CM | POA: Diagnosis present

## 2021-01-11 DIAGNOSIS — N1832 Chronic kidney disease, stage 3b: Secondary | ICD-10-CM | POA: Diagnosis present

## 2021-01-11 DIAGNOSIS — Z90711 Acquired absence of uterus with remaining cervical stump: Secondary | ICD-10-CM

## 2021-01-11 DIAGNOSIS — E1165 Type 2 diabetes mellitus with hyperglycemia: Secondary | ICD-10-CM | POA: Diagnosis present

## 2021-01-11 DIAGNOSIS — I495 Sick sinus syndrome: Secondary | ICD-10-CM | POA: Diagnosis present

## 2021-01-11 DIAGNOSIS — M5136 Other intervertebral disc degeneration, lumbar region: Secondary | ICD-10-CM | POA: Diagnosis present

## 2021-01-11 DIAGNOSIS — M25551 Pain in right hip: Secondary | ICD-10-CM | POA: Diagnosis present

## 2021-01-11 DIAGNOSIS — M1990 Unspecified osteoarthritis, unspecified site: Secondary | ICD-10-CM | POA: Diagnosis present

## 2021-01-11 DIAGNOSIS — N179 Acute kidney failure, unspecified: Secondary | ICD-10-CM | POA: Diagnosis present

## 2021-01-11 DIAGNOSIS — M51369 Other intervertebral disc degeneration, lumbar region without mention of lumbar back pain or lower extremity pain: Secondary | ICD-10-CM | POA: Diagnosis present

## 2021-01-11 DIAGNOSIS — Z880 Allergy status to penicillin: Secondary | ICD-10-CM

## 2021-01-11 DIAGNOSIS — E7800 Pure hypercholesterolemia, unspecified: Secondary | ICD-10-CM | POA: Diagnosis present

## 2021-01-11 DIAGNOSIS — E1122 Type 2 diabetes mellitus with diabetic chronic kidney disease: Secondary | ICD-10-CM | POA: Diagnosis present

## 2021-01-11 DIAGNOSIS — Z96651 Presence of right artificial knee joint: Secondary | ICD-10-CM | POA: Diagnosis present

## 2021-01-11 DIAGNOSIS — I2510 Atherosclerotic heart disease of native coronary artery without angina pectoris: Secondary | ICD-10-CM

## 2021-01-11 DIAGNOSIS — Z20822 Contact with and (suspected) exposure to covid-19: Secondary | ICD-10-CM | POA: Diagnosis present

## 2021-01-11 DIAGNOSIS — Z6837 Body mass index (BMI) 37.0-37.9, adult: Secondary | ICD-10-CM

## 2021-01-11 DIAGNOSIS — Z87891 Personal history of nicotine dependence: Secondary | ICD-10-CM

## 2021-01-11 DIAGNOSIS — Z886 Allergy status to analgesic agent status: Secondary | ICD-10-CM

## 2021-01-11 DIAGNOSIS — E669 Obesity, unspecified: Secondary | ICD-10-CM | POA: Diagnosis present

## 2021-01-11 DIAGNOSIS — I10 Essential (primary) hypertension: Secondary | ICD-10-CM | POA: Diagnosis present

## 2021-01-11 HISTORY — PX: LEFT HEART CATH AND CORONARY ANGIOGRAPHY: CATH118249

## 2021-01-11 HISTORY — PX: CORONARY/GRAFT ACUTE MI REVASCULARIZATION: CATH118305

## 2021-01-11 HISTORY — DX: ST elevation (STEMI) myocardial infarction involving other coronary artery of inferior wall: I21.19

## 2021-01-11 HISTORY — DX: Atherosclerotic heart disease of native coronary artery without angina pectoris: I25.10

## 2021-01-11 LAB — PROTIME-INR
INR: 1 (ref 0.8–1.2)
Prothrombin Time: 12.7 seconds (ref 11.4–15.2)

## 2021-01-11 LAB — CBC WITH DIFFERENTIAL/PLATELET
Abs Immature Granulocytes: 0.03 10*3/uL (ref 0.00–0.07)
Basophils Absolute: 0.1 10*3/uL (ref 0.0–0.1)
Basophils Relative: 1 %
Eosinophils Absolute: 0.3 10*3/uL (ref 0.0–0.5)
Eosinophils Relative: 3 %
HCT: 41 % (ref 36.0–46.0)
Hemoglobin: 13.8 g/dL (ref 12.0–15.0)
Immature Granulocytes: 0 %
Lymphocytes Relative: 21 %
Lymphs Abs: 1.9 10*3/uL (ref 0.7–4.0)
MCH: 33.5 pg (ref 26.0–34.0)
MCHC: 33.7 g/dL (ref 30.0–36.0)
MCV: 99.5 fL (ref 80.0–100.0)
Monocytes Absolute: 0.4 10*3/uL (ref 0.1–1.0)
Monocytes Relative: 4 %
Neutro Abs: 6.5 10*3/uL (ref 1.7–7.7)
Neutrophils Relative %: 71 %
Platelets: 294 10*3/uL (ref 150–400)
RBC: 4.12 MIL/uL (ref 3.87–5.11)
RDW: 14 % (ref 11.5–15.5)
WBC: 9.1 10*3/uL (ref 4.0–10.5)
nRBC: 0 % (ref 0.0–0.2)

## 2021-01-11 LAB — RESP PANEL BY RT-PCR (FLU A&B, COVID) ARPGX2
Influenza A by PCR: NEGATIVE
Influenza B by PCR: NEGATIVE
SARS Coronavirus 2 by RT PCR: NEGATIVE

## 2021-01-11 LAB — POCT ACTIVATED CLOTTING TIME
Activated Clotting Time: 410 seconds
Activated Clotting Time: 323 seconds

## 2021-01-11 LAB — COMPREHENSIVE METABOLIC PANEL
ALT: 17 U/L (ref 0–44)
AST: 28 U/L (ref 15–41)
Albumin: 4 g/dL (ref 3.5–5.0)
Alkaline Phosphatase: 86 U/L (ref 38–126)
Anion gap: 12 (ref 5–15)
BUN: 34 mg/dL — ABNORMAL HIGH (ref 8–23)
CO2: 28 mmol/L (ref 22–32)
Calcium: 11.2 mg/dL — ABNORMAL HIGH (ref 8.9–10.3)
Chloride: 98 mmol/L (ref 98–111)
Creatinine, Ser: 1.39 mg/dL — ABNORMAL HIGH (ref 0.44–1.00)
GFR, Estimated: 39 mL/min — ABNORMAL LOW (ref >60)
Glucose, Bld: 203 mg/dL — ABNORMAL HIGH (ref 70–99)
Potassium: 3.5 mmol/L (ref 3.5–5.1)
Sodium: 138 mmol/L (ref 135–145)
Total Bilirubin: 0.7 mg/dL (ref 0.3–1.2)
Total Protein: 8.3 g/dL — ABNORMAL HIGH (ref 6.5–8.1)

## 2021-01-11 LAB — GLUCOSE, CAPILLARY: Glucose-Capillary: 170 mg/dL — ABNORMAL HIGH (ref 70–99)

## 2021-01-11 LAB — TROPONIN I (HIGH SENSITIVITY): Troponin I (High Sensitivity): 680 ng/L (ref <18)

## 2021-01-11 SURGERY — CORONARY/GRAFT ACUTE MI REVASCULARIZATION
Anesthesia: Moderate Sedation

## 2021-01-11 MED ORDER — SODIUM CHLORIDE 0.9 % IV SOLN: 250.0000 mL | INTRAVENOUS | Status: DC | PRN

## 2021-01-11 MED ORDER — HEPARIN SODIUM (PORCINE) 1000 UNIT/ML IJ SOLN
INTRAMUSCULAR | Status: AC
  Filled 2021-01-11: qty 1

## 2021-01-11 MED ORDER — HYDRALAZINE HCL 20 MG/ML IJ SOLN
10.0000 mg | INTRAMUSCULAR | Status: AC | PRN
  Administered 2021-01-11: 10 mg via INTRAVENOUS
  Filled 2021-01-11: qty 1

## 2021-01-11 MED ORDER — SODIUM CHLORIDE 0.9 % WEIGHT BASED INFUSION
1.0000 mL/kg/h | INTRAVENOUS | Status: AC
  Administered 2021-01-11: 1 mL/kg/h via INTRAVENOUS

## 2021-01-11 MED ORDER — ATORVASTATIN CALCIUM 80 MG PO TABS
80.0000 mg | ORAL_TABLET | Freq: Every day | ORAL | Status: DC
  Administered 2021-01-11 – 2021-01-13 (×3): 80 mg via ORAL
  Filled 2021-01-11: qty 4
  Filled 2021-01-11 (×3): qty 1
  Filled 2021-01-11: qty 4

## 2021-01-11 MED ORDER — TICAGRELOR 90 MG PO TABS
ORAL_TABLET | ORAL | Status: AC
  Filled 2021-01-11: qty 2

## 2021-01-11 MED ORDER — TICAGRELOR 90 MG PO TABS
ORAL_TABLET | ORAL | Status: DC | PRN
  Administered 2021-01-11: 180 mg via ORAL

## 2021-01-11 MED ORDER — LABETALOL HCL 5 MG/ML IV SOLN: 10.0000 mg | INTRAVENOUS | Status: AC | PRN

## 2021-01-11 MED ORDER — HEPARIN (PORCINE) IN NACL 1000-0.9 UT/500ML-% IV SOLN
INTRAVENOUS | Status: AC
  Filled 2021-01-11: qty 1000

## 2021-01-11 MED ORDER — FENTANYL CITRATE (PF) 100 MCG/2ML IJ SOLN
INTRAMUSCULAR | Status: DC | PRN
  Administered 2021-01-11: 25 ug via INTRAVENOUS

## 2021-01-11 MED ORDER — MIDAZOLAM HCL 2 MG/2ML IJ SOLN
INTRAMUSCULAR | Status: AC
  Filled 2021-01-11: qty 2

## 2021-01-11 MED ORDER — ASPIRIN 81 MG PO CHEW
81.0000 mg | CHEWABLE_TABLET | Freq: Every day | ORAL | Status: DC
  Administered 2021-01-12 – 2021-01-13 (×2): 81 mg via ORAL
  Filled 2021-01-11 (×2): qty 1

## 2021-01-11 MED ORDER — VERAPAMIL HCL 2.5 MG/ML IV SOLN
INTRAVENOUS | Status: DC | PRN
  Administered 2021-01-11: 2.5 mg via INTRAVENOUS

## 2021-01-11 MED ORDER — ACETAMINOPHEN 325 MG PO TABS
650.0000 mg | ORAL_TABLET | ORAL | Status: DC | PRN
  Administered 2021-01-11: 650 mg via ORAL
  Filled 2021-01-11 (×2): qty 2

## 2021-01-11 MED ORDER — LIDOCAINE HCL (PF) 1 % IJ SOLN
INTRAMUSCULAR | Status: DC | PRN
  Administered 2021-01-11: 2 mL

## 2021-01-11 MED ORDER — HEPARIN SODIUM (PORCINE) 1000 UNIT/ML IJ SOLN
INTRAMUSCULAR | Status: DC | PRN
Start: 2021-01-11 — End: 2021-01-11
  Administered 2021-01-11: 8000 [IU] via INTRAVENOUS
  Administered 2021-01-11: 4000 [IU] via INTRAVENOUS

## 2021-01-11 MED ORDER — TICAGRELOR 90 MG PO TABS
90.0000 mg | ORAL_TABLET | Freq: Two times a day (BID) | ORAL | Status: DC
  Administered 2021-01-11 – 2021-01-13 (×4): 90 mg via ORAL
  Filled 2021-01-11 (×4): qty 1

## 2021-01-11 MED ORDER — VERAPAMIL HCL 2.5 MG/ML IV SOLN
INTRAVENOUS | Status: AC
  Filled 2021-01-11: qty 2

## 2021-01-11 MED ORDER — MIDAZOLAM HCL 2 MG/2ML IJ SOLN
INTRAMUSCULAR | Status: DC | PRN
Start: 2021-01-11 — End: 2021-01-11
  Administered 2021-01-11 (×2): 1 mg via INTRAVENOUS

## 2021-01-11 MED ORDER — METOPROLOL TARTRATE 25 MG PO TABS
25.0000 mg | ORAL_TABLET | Freq: Two times a day (BID) | ORAL | Status: DC
  Administered 2021-01-11 – 2021-01-13 (×5): 25 mg via ORAL
  Filled 2021-01-11 (×5): qty 1

## 2021-01-11 MED ORDER — FENTANYL CITRATE (PF) 100 MCG/2ML IJ SOLN
INTRAMUSCULAR | Status: AC
  Filled 2021-01-11: qty 2

## 2021-01-11 MED ORDER — ONDANSETRON HCL 4 MG/2ML IJ SOLN: 4.0000 mg | Freq: Four times a day (QID) | INTRAMUSCULAR | Status: DC | PRN

## 2021-01-11 MED ORDER — SODIUM CHLORIDE 0.9% FLUSH
3.0000 mL | Freq: Two times a day (BID) | INTRAVENOUS | Status: DC
  Administered 2021-01-12 – 2021-01-13 (×3): 3 mL via INTRAVENOUS

## 2021-01-11 MED ORDER — HEPARIN (PORCINE) IN NACL 2000-0.9 UNIT/L-% IV SOLN
INTRAVENOUS | Status: DC | PRN
  Administered 2021-01-11: 1000 mL

## 2021-01-11 MED ORDER — LIDOCAINE HCL (PF) 1 % IJ SOLN
INTRAMUSCULAR | Status: AC
  Filled 2021-01-11: qty 30

## 2021-01-11 MED ORDER — IOHEXOL 300 MG/ML  SOLN
INTRAMUSCULAR | Status: DC | PRN
  Administered 2021-01-11: 136 mL

## 2021-01-11 MED ORDER — SODIUM CHLORIDE 0.9% FLUSH: 3.0000 mL | INTRAVENOUS | Status: DC | PRN

## 2021-01-11 SURGICAL SUPPLY — 21 items
BALLN TREK RX 2.5X15 (BALLOONS) ×3
BALLN ~~LOC~~ EUPHORA RX 3.0X20 (BALLOONS) ×3
BALLOON TREK RX 2.5X15 (BALLOONS) ×1 IMPLANT
BALLOON ~~LOC~~ EUPHORA RX 3.0X20 (BALLOONS) ×1 IMPLANT
CATH INFINITI 5 FR JL3.5 (CATHETERS) ×3 IMPLANT
CATH INFINITI 5FR ANG PIGTAIL (CATHETERS) ×3 IMPLANT
CATH VISTA GUIDE 6FR JR4 (CATHETERS) ×3 IMPLANT
DEVICE RAD TR BAND REGULAR (VASCULAR PRODUCTS) ×3 IMPLANT
DRAPE BRACHIAL (DRAPES) ×3 IMPLANT
GLIDESHEATH SLEND SS 6F .021 (SHEATH) ×3 IMPLANT
GUIDEWIRE INQWIRE 1.5J.035X260 (WIRE) ×1 IMPLANT
INQWIRE 1.5J .035X260CM (WIRE) ×3
KIT ENCORE 26 ADVANTAGE (KITS) ×3 IMPLANT
KIT SYRINGE INJ CVI SPIKEX1 (MISCELLANEOUS) ×3 IMPLANT
PACK CARDIAC CATH (CUSTOM PROCEDURE TRAY) ×3 IMPLANT
PROTECTION STATION PRESSURIZED (MISCELLANEOUS) ×3
SET ATX SIMPLICITY (MISCELLANEOUS) ×3 IMPLANT
STATION PROTECTION PRESSURIZED (MISCELLANEOUS) ×1 IMPLANT
STENT RESOLUTE ONYX 2.75X30 (Permanent Stent) ×3 IMPLANT
TUBING CIL FLEX 10 FLL-RA (TUBING) ×3 IMPLANT
WIRE ASAHI PROWATER 180CM (WIRE) ×3 IMPLANT

## 2021-01-11 NOTE — ED Triage Notes (Signed)
Pt coming from home with a chief complaint of CP ongoing for aprox 3-days. Pt describes pain as a discomfort that radiates to her neck . EMS gave ASA, 2 L of oxygen . 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   Cardiologist at bedside

## 2021-01-11 NOTE — ED Provider Notes (Signed)
[facility] Emergency Department Provider Note  ____________________________________________   Event Date/Time   First MD Initiated Contact with Patient 01/11/21 1125     (approximate)  I have reviewed the triage vital signs and the nursing notes.   HISTORY  Chief Complaint Chest Pain   HPI Brooke Davis is a 80 y.o. female with past medical history of A. fib although not anticoagulated or on rate control, gout, HTN, arthritis and obesity who presents via EMS for assessment of chest pain.  Patient states she has had on and off chest pain over the last 3 days is felt it was much worse today.  She states she received arrangement 24 mg of ASA with EMS her pain, since resolved on presentation.  He has not had any recent falls, head injuries, fevers, cough, shortness of breath, Donnell pain, back pain, urinary symptoms does Dors little diarrhea last couple days.  She has not had any focal weakness numbness tingling or rashes.  No prior history of coronary artery disease.  No other concerns at this time.         Past Medical History:  Diagnosis Date   Arthritis    fingers, right hip   Atrial fibrillation (HCC)    Bronchitis    Gout    Hypertension    Varicose veins of legs    Wears dentures    full upper and lower   Wears hearing aid in both ears     There are no problems to display for this patient.   Past Surgical History:  Procedure Laterality Date   BREAST BIOPSY Left 2011   NEG   CARDIAC CATHETERIZATION     over 20 yrs ago.  All OK.   CATARACT EXTRACTION W/PHACO Left 03/27/2020   Procedure: CATARACT EXTRACTION PHACO AND INTRAOCULAR LENS PLACEMENT (IOC) LEFT 7.49  00:56.4  13.3%;  Surgeon: Lockie Mola, MD;  Location: [facility];  Service: Ophthalmology;  Laterality: Left;   CATARACT EXTRACTION W/PHACO Right 04/24/2020   Procedure: CATARACT EXTRACTION PHACO AND INTRAOCULAR LENS PLACEMENT (IOC) RIGHT;  Surgeon: Lockie Mola, MD;  Location: [facility];  Service: Ophthalmology;  Laterality: Right;  8.34 0:50.2 16.6%   PARTIAL HYSTERECTOMY      Prior to Admission medications   Medication Sig Start Date End Date Taking? Authorizing Provider  acetaminophen (TYLENOL) 500 MG tablet Take 500 mg by mouth every 6 (six) hours as needed.    [provider]  amLODipine (NORVASC) 5 MG tablet Take 5 mg by mouth daily.    [provider]  aspirin EC 81 MG tablet Take 81 mg by mouth daily. Swallow whole.    [provider]  meloxicam (MOBIC) 15 MG tablet Take 15 mg by mouth daily.    [provider]  probenecid (BENEMID) 500 MG tablet Take 500 mg by mouth 2 (two) times daily.    [provider]  traMADol (ULTRAM) 50 MG tablet Take by mouth every 6 (six) hours as needed.    [provider]  triamterene-hydrochlorothiazide (DYAZIDE) 37.5-25 MG capsule Take 1 capsule by mouth daily.    [provider]    Allergies Celebrex [celecoxib], Mobic [meloxicam], Penicillins, and Relafen [nabumetone]  Family History  Problem Relation Age of Onset   Breast cancer Neg Hx     Social History Social History   Tobacco Use   Smoking status: Former    Pack years: 0.00    Types: Cigarettes    Quit  date: 24    Years since quitting: 42.5   Smokeless tobacco: Never  Vaping Use   Vaping Use: Never used  Substance Use Topics   Alcohol use: Never    Review of Systems  Review of Systems  Constitutional:  Negative for chills and fever.  HENT:  Negative for sore throat.   Eyes:  Negative for pain.  Respiratory:  Negative for cough and stridor.   Cardiovascular:  Positive for chest pain.  Gastrointestinal:  Positive for diarrhea. Negative for vomiting.  Genitourinary:  Negative for dysuria.  Musculoskeletal:  Negative for myalgias.  Skin:  Negative for rash.  Neurological:  Negative for seizures, loss of consciousness and headaches.   Psychiatric/Behavioral:  Negative for suicidal ideas.   All other systems reviewed and are negative.    ____________________________________________   PHYSICAL EXAM:  VITAL SIGNS: ED Triage Vitals  Enc Vitals Group     BP      Pulse      Resp      Temp      Temp src      SpO2      Weight      Height      Head Circumference      Peak Flow      Pain Score      Pain Loc      Pain Edu?      Excl. in GC?    Vitals:   01/11/21 1133  BP: 140/80  Pulse: 80  Resp: (!) 22  Temp: 98 F (36.7 C)  SpO2: 100%   Physical Exam Vitals and nursing note reviewed.  Constitutional:      General: She is not in acute distress.    Appearance: She is well-developed. She is obese.  HENT:     Head: Normocephalic and atraumatic.     Right Ear: External ear normal.     Left Ear: External ear normal.     Nose: Nose normal.  Eyes:     Conjunctiva/sclera: Conjunctivae normal.  Cardiovascular:     Rate and Rhythm: Normal rate and regular rhythm.     Heart sounds: No murmur heard. Pulmonary:     Effort: Pulmonary effort is normal. No respiratory distress.     Breath sounds: Normal breath sounds.  Abdominal:     Palpations: Abdomen is soft.     Tenderness: There is no abdominal tenderness.  Musculoskeletal:     Cervical back: Neck supple.  Skin:    General: Skin is warm and dry.     Capillary Refill: Capillary refill takes less than 2 seconds.  Neurological:     Mental Status: She is alert and oriented to person, place, and time.  Psychiatric:        Mood and Affect: Mood normal.     ____________________________________________   LABS (all labs ordered are listed, but only abnormal results are displayed)  Labs Reviewed  RESP PANEL BY RT-PCR (FLU A&B, COVID) ARPGX2  CBC WITH DIFFERENTIAL/PLATELET  PROTIME-INR  COMPREHENSIVE METABOLIC PANEL  TROPONIN I (HIGH SENSITIVITY)   ____________________________________________  EKG  A. fib with a rate of 89 with some  nonspecific changes in the inferior leads and lateral leads otherwise normal axis unremarkable intervals.  ECG reviewed myself shows inferior ST elevations higher than ECG obtained in the ED. ____________________________________________  RADIOLOGY  ED MD interpretation:    Official radiology report(s): No results found.  ____________________________________________   PROCEDURES  Procedure(s) performed (including Critical Care):  .Critical Care  Date/Time: 01/11/2021 12:01 PM Performed by: Gilles Chiquito, MD Authorized by: Gilles Chiquito, MD   Critical care provider statement:    Critical care time (minutes):  15   Critical care was necessary to treat or prevent imminent or life-threatening deterioration of the following conditions:  Cardiac failure   Critical care was time spent personally by me on the following activities:  Discussions with consultants, evaluation of patient's response to treatment, examination of patient, ordering and performing treatments and interventions, ordering and review of laboratory studies, ordering and review of radiographic studies, pulse oximetry, re-evaluation of patient's condition, obtaining history from patient or surrogate and review of old charts   ____________________________________________   INITIAL IMPRESSION / ASSESSMENT AND PLAN / ED COURSE      Patient presents via EMS from home as a code STEMI after they were called out for evaluation of chest pain.  Patient had ST elevations in inferior leads on EMS EKG consistent with STEMI.  On arrival patient is afebrile and hemodynamically stable.  Arrival ECG shows nonspecific findings in inferior lateral leads and a rhythm of A. fib.  Patient is pain-free on arrival.  He also notes some diarrhea but no other acute sick symptoms.  Additional differential includes possible PE, pneumonia, metabolic derangements, anemia.  Patient seen at bedside arrival by Dr. Darrold Junker who consented patient  catheterization recommended against administering heparin bolus while in the ED.  Also recommended discussing with hospitalist for admission for plan for stepdown after PCI.  No labs resulted at time of patient leaving ED.      ____________________________________________   FINAL CLINICAL IMPRESSION(S) / ED DIAGNOSES  Final diagnoses:  ST elevation myocardial infarction (STEMI), unspecified artery (HCC)  Diarrhea, unspecified type    Medications  fentaNYL (SUBLIMAZE) injection (25 mcg Intravenous Given 01/11/21 1153)  midazolam (VERSED) injection (1 mg Intravenous Given 01/11/21 1153)  Heparin (Porcine) in NaCl 2000-0.9 UNIT/L-% SOLN (1,000 mLs  Given 01/11/21 1153)  verapamil (ISOPTIN) injection (2.5 mg Intravenous Given 01/11/21 1154)  heparin sodium (porcine) injection (4,000 Units Intravenous Given 01/11/21 1156)  lidocaine (PF) (XYLOCAINE) 1 % injection (2 mLs Infiltration Given 01/11/21 1156)     ED Discharge Orders     None        Note:  This document was prepared using Dragon voice recognition software and may include unintentional dictation errors.    Gilles Chiquito, MD 01/11/21 1201

## 2021-01-11 NOTE — Progress Notes (Signed)
  Chaplain On-Call responded to Code STEMI notification for this patient in the Emergency Department, room-12.  Chaplain arrived on the Unit and observed the medical team preparing the patient to be transported to the Cardiac Cath Lab.  The patient was not available for a visit.  Chaplain will be available for support as needed.  Chaplain Evelena Peat M.Div., [facility]

## 2021-01-11 NOTE — H&P (Signed)
History and Physical    Brooke Davis ZOX:[id number] DOB: 1940-08-28 DOA: 01/11/2021  PCP: Jerl MinaHedrick, James, MD Patient coming from: Home Cardiologist: Years ago saw Dr. Welton FlakesKhan in [place] for a cathterization but has not seen him since then.  I have personally briefly reviewed patient's old [facility].  Chief Complaint: chest pain  HPI: Brooke Davis is a 80 y.o. female with medical history significant for hypertension, diet-controlled diabetes, history of diet controlled hyperlipidemia, transient atrial fibrillation in 2015 resolved, gout, arthritis, who presents to the emergency department on 01/11/2021 with chest pain. Onset of patient's symptoms was 2-3 days prior to admission and duration is intermittent. Pain is characterized as a 9/10 pressure that is substernal and radiated to the neck. Symptoms are alleviated by nothing and exacerbated by walking or exertion. Associated symptoms: Shortness of breath with exertion x months; worsening shortness of breath with the chest pain. On the day of admission patient had profuse diaphoresis with the chest pain and reports she broke out in a cold sweat. She felt like she was going to pass out but did not actually have a syncopal episode. Nausea. Mild diarrhea x 3 days. No abdominal pain, vomiting, or bloody stool. Chronic lower extremity weakness bilaterally. Over the last year she has been having intermittent palpitations. Over several months she noticed swelling in her legs usually in the afternoons, right is always larger. Right hip pain is chronic.  Treatment: tried antacids at home and at first it seemed to help but then it did not help any more. At baseline patient takes aspirin 162 mg daily.    History:  Atrial fibrillation history: A fib was discovered before (not after) her knee surgery in 2015. She was on Relafen at the time and had temporary atrial fibrillation; after she stopped the Relafen she had no further  atrial fibrillation.  Card cath years ago was normal.  No history of stents, ablations, or CHF.  No asthma or GERD.   ED Course: Patient had telemetry enroute indicating STEMI; she was taken to the cardiac catheterization lab immediately.    Review of Systems: As per HPI otherwise all other systems reviewed and are negative.  GENERAL: No Fever, chills, or diaphoresis. Positive for fatigue/malaise.  GENITOURINARY: No dysuria or hematuria.   Past Medical History:  Diagnosis Date   Arthritis    fingers, right hip   Atrial fibrillation (HCC)    [facility] before knee surgery; thought to be due to Relafen; Afib stopped after Relafen was discontinued.   Bronchitis    DDD (degenerative disc disease), lumbar 12/01/2013   Gout    Hypertension    Sinoatrial node dysfunction (HCC) 11/10/2013   Varicose veins of legs    Wears dentures    full upper and lower   Wears hearing aid in both ears      Past Surgical History:  Procedure Laterality Date   BREAST BIOPSY Left 2011   NEG   CARDIAC CATHETERIZATION     over 20 yrs ago.  All OK.   CATARACT EXTRACTION W/PHACO Left 03/27/2020   Procedure: CATARACT EXTRACTION PHACO AND INTRAOCULAR LENS PLACEMENT (IOC) LEFT 7.49  00:56.4  13.3%;  Surgeon: Lockie MolaBrasington, Chadwick, MD;  [facility];  Service: Ophthalmology;  Laterality: Left;   CATARACT EXTRACTION W/PHACO Right 04/24/2020   Procedure: CATARACT EXTRACTION PHACO AND INTRAOCULAR LENS PLACEMENT (IOC) RIGHT;  Surgeon: Lockie MolaBrasington, Chadwick, MD;  [facility];  Service: Ophthalmology;  Laterality:  Right;  8.34 0:50.2 16.6%   PARTIAL HYSTERECTOMY     R knee replacement 1997  Social History  reports that she quit smoking about 42 years ago. Her smoking use included cigarettes. She has never used smokeless tobacco. She reports that she does not drink alcohol and does not use drugs.  Previously smoked 2 ppd; was in her 2s when she  started.  Previously drank alcohol sometimes heavily but none in more than 10 years.  No drugs.    Allergies  Allergen Reactions   Celebrex [Celecoxib] Diarrhea   Mobic [Meloxicam] Diarrhea    Mobic brand only.  OK with generic meloxicam.   Penicillins Itching   Relafen [Nabumetone]     Palpitation     Family History  Problem Relation Age of Onset   Heart attack Mother 32       required open heart surgery. Onset age is unknown.   Stroke Father    Diabetes Father    Alcoholism Brother    Breast cancer Neg Hx      Home Medications  Prior to Admission medications   Medication Sig Start Date End Date Taking? Authorizing Provider  acetaminophen (TYLENOL) 500 MG tablet Take 500 mg by mouth every 6 (six) hours as needed.    [provider]  amLODipine (NORVASC) 5 MG tablet Take 5 mg by mouth daily.    [provider]  aspirin EC 81 MG tablet Take 81 mg by mouth daily. Swallow whole.    [provider]  meloxicam (MOBIC) 15 MG tablet Take 15 mg by mouth daily.    [provider]  probenecid (BENEMID) 500 MG tablet Take 500 mg by mouth 2 (two) times daily.    [provider]  traMADol (ULTRAM) 50 MG tablet Take by mouth every 6 (six) hours as needed.    [provider]  triamterene-hydrochlorothiazide (DYAZIDE) 37.5-25 MG capsule Take 1 capsule by mouth daily.    [provider]    Physical Exam: Vitals:   01/11/21 1133 01/11/21 1301  BP: 140/80 (!) 162/108  Pulse: 80   Resp: (!) 22 18  Temp: 98 F (36.7 C) 98.8 F (37.1 C)  TempSrc: Oral Oral  SpO2: 100% 99%  Weight:  93.4 kg    Constitutional: NAD, calm, comfortable, ill-appearing. Vitals:   01/11/21 1133 01/11/21 1301  BP: 140/80 (!) 162/108  Pulse: 80   Resp: (!) 22 18  Temp: 98 F (36.7 C) 98.8 F (37.1 C)  TempSrc: Oral Oral  SpO2: 100% 99%  Weight:  93.4 kg   Eyes: PERRL, lids and conjunctivae without icterus or erythema. ENMT: Mucous  membranes are dry. Posterior pharynx clear of any exudate or lesions. Nares patent without discharge or bleeding.  Normocephalic, atraumatic.  Normal dentition.  Neck: normal, supple, no masses, trachea midline.  Thyroid nontender, no masses appreciated, no thyromegaly. Respiratory: clear to auscultation bilaterally. Chest wall movements are symmetric. No wheezing, no crackles.  No rhonchi.  Normal respiratory effort. No accessory muscle use.  Cardiovascular: Regular rate and rhythm, No rubs / gallops. Murmur 2/6 systolic. Trace lower extremity edema on left and 1+ on right. Pulses: DP pulses 2+ bilaterally. No carotid bruits.  Capillary refill less than 3 seconds. Edema: None bilaterally. GI: soft, non-distended, normal active bowel sounds. No hepatosplenomegaly. No rigidity, rebound, or guarding. Non-tender. No masses palpated.  Musculoskeletal: no clubbing / cyanosis. No joint deformity upper and lower extremities. Good ROM, no contractures. Normal muscle tone.  No tenderness  or deformity in the back bilaterally. Mild tenderness over right hip (chronic). Integument: no rashes, lesions, ulcers. No induration. Clean, dry, intact. Neurologic: CN 2-12 grossly intact. Sensation grossly intact to light touch. DTR 2+ bilaterally.  Babinski: Toes downgoing bilaterally.  Strength 5/5 in all 4.  Intact rapid alternating movements bilaterally.  No pronator drift. Psychiatric: Normal judgment and insight. Alert and oriented x 3. Normal mood.  Normal and appropriate affect. Lymphatic: No cervical lymphadenopathy. No supraclavicular lymphadenopathy.   Labs on Admission: I have personally reviewed the following labs and imaging studies.  CBC: Recent Labs  Lab 01/11/21 1130  WBC 9.1  NEUTROABS 6.5  HGB 13.8  HCT 41.0  MCV 99.5  PLT 294    Basic Metabolic Panel: Recent Labs  Lab 01/11/21 1130  NA 138  K 3.5  CL 98  CO2 28  GLUCOSE 203*  BUN 34*  CREATININE 1.39*  CALCIUM 11.2*    GFR: CrCl  cannot be calculated (Unknown ideal weight.).  Liver Function Tests: Recent Labs  Lab 01/11/21 1130  AST 28  ALT 17  ALKPHOS 86  BILITOT 0.7  PROT 8.3*  ALBUMIN 4.0    Urine analysis: No results found for: COLORURINE, APPEARANCEUR, LABSPEC, PHURINE, GLUCOSEU, HGBUR, BILIRUBINUR, KETONESUR, PROTEINUR, UROBILINOGEN, NITRITE, LEUKOCYTESUR  Radiological Exams on Admission: CARDIAC CATHETERIZATION  Result Date: 01/11/2021  Suezanne Jacquet LM lesion is 40% stenosed.  Mid Cx lesion is 50% stenosed.  Ost Cx lesion is 30% stenosed.  Prox LAD lesion is 30% stenosed.  Mid LAD lesion is 30% stenosed.  Prox RCA lesion is 99% stenosed.  Prox RCA to Mid RCA lesion is 75% stenosed.  Dist RCA lesion is 60% stenosed.  A drug-eluting stent was successfully placed using a STENT RESOLUTE ONYX 2.75X30.  Post intervention, there is a 0% residual stenosis.  Post intervention, there is a 0% residual stenosis.  1.  Inferior STEMI 2.  Subtotal 99% stenosis proximal RCA 3.  Mildly reduced left ventricular function with inferior hypokinesis 4.  Successful primary PCI with 2.75 x 30 mm resolute Onyx drug-eluting stent mid and proximal RCA Recommendations 1.  Dual antiplatelet therapy uninterrupted for 1 year 2.  Metoprolol tartrate 25 mg twice daily 3.  High intensity atorvastatin 80 mg daily 4.  2D echocardiogram    EKG: Independently reviewed. 89 bpm. Atrial fibrillation. ST elevation in leads 3 and aVF.   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 Severe stenosis of RCA; stent placed. Aspirin, Plavix, Brilinta ordered. Start beta blocker. Start ACE/ARB if patient can tolerate. Discussed case with cardiologist, who will manage anticoagulation/antiplatelet orders. Patient is chest pain free after cath.  Active Problems:   Atrial fibrillation, transient [facility]) May be just temporary and related to acute event. Previous brief a fib years ago was due to Relafen.  Plan: Start beta blocker. Cardiologist will monitor to see if atrial fibrillation continues and if it does then will make further anticoagulation recommendations.  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 Plan: Stent placed. See STEMI above.    AKI (acute kidney injury) Likely due to dehydration. Plan: Avoid nephrotoxins when possible. Patient receiving IVF. Recheck Cr in the am. Further treatment depending on course.    Controlled type 2 diabetes mellitus with hyperglycemia, without long-term current use of insulin    Hypercalcemia Patient took antacids that may have been calcium tablets that could have contributed to her hypercalcemia. Plan: Gentle IVFs and recheck in AM. Avoid calcium supplements. Patient advised to follow up outpatient with primary care.    Primary hypertension Plan: Stop home bp meds and change to medications that provide rate control.    DDD (degenerative disc disease), lumbar Plan: Continue outpatient follow up.    Pure hypercholesterolemia Plan: Atorvastatin. Check lipid panel.    DVT prophylaxis: Lovenox.  Code Status:   Full Code Family Communication:    Job Founds Daughter [phone]        Raynelle Fanning was at bedside)  Reeda, [place] Daughter     [phone]    Disposition Plan:   Patient is from:  Home  Anticipated DC  to:  Home  Anticipated DC date:  01/16/21  Anticipated DC barriers: None  Consults called:  Cardiology: Dr. Ranae Plumber  Admission status:  Inpatient   Severity of Illness: The appropriate patient status for this patient is INPATIENT. Inpatient status is judged to be reasonable and necessary in order to provide the required intensity of service to ensure the patient's safety. The patient's presenting symptoms, physical exam findings, and initial radiographic and laboratory data in the context of their chronic comorbidities is felt to place them at high risk for further clinical deterioration. Furthermore, it is not anticipated that the patient will be medically stable for discharge from the hospital within 2 midnights of admission. The following factors support the patient status of inpatient.   " The patient's presenting symptoms include chest pain.. " The worrisome physical exam findings include murmur. " The initial radiographic and laboratory data are worrisome because of EKG with STEMI.. " The chronic co-morbidities include DM, HTN, hyperlipidemia.   * I certify that at the point of admission it is my clinical judgment that the patient will require inpatient hospital care spanning beyond 2 midnights from the point of admission due to high intensity of service, high risk for further deterioration and high frequency of surveillance required.Marlow Baars MD [facility]  How to contact the [facility] Attending or Consulting provider 7A - 7P or covering provider during after hours 7P -7A, for this patient?   Check the care team in [facility] and look for a) attending/consulting TR[facility] provider listed and b) the [facility] team listed Log into [email or web address] and use [place]'s universal password to access. If you do not have the password, please contact the hospital operator. Locate the [facility] provider you are looking for under [facility] and page to a number that you can be directly reached. If  you still have difficulty reaching the provider, please page the [facility] (Director on Call) for the Hospitalists listed on amion for assistance.  01/11/2021, 1:11 PM

## 2021-01-11 NOTE — Consult Note (Signed)
[facility] Cardiology  CARDIOLOGY CONSULT NOTE  Patient ID: Brooke Davis MRN: [id number] DOB/AGE: Feb 13, 1941 80 y.o.  Admit date: 01/11/2021 Referring Physician Katrinka Blazing Primary Physician [facility] Primary Cardiologist Chasitie Passey Reason for Consultation inferior STEMI  HPI: 80 year old female referred for evaluation of inferior ST elevation myocardial infarction.  The patient has a history of essential hypertension, type 2 diabetes, undocumented atrial fibrillation.  She was brought to to [facility] ED via EMS with 3-day history of intermittent chest pain.  In route, ECG revealed ST elevation in leads III and aVF consistent with inferior STEMI.  The patient was brought to cardiac catheterization laboratory where coronary angiography revealed high-grade subtotal 99% stenosis proximal RCA with 75% stenosis in the midsegment.  Patient underwent primary PCI with 2.75 x 30 mm Resolute Onyx drug-eluting stent with excellent angiographic result.  Left ventriculography revealed mild reduced left ventricular function, with LVEF 45 to 50%.  Review of systems complete and found to be negative unless listed above     Past Medical History:  Diagnosis Date   Arthritis    fingers, right hip   Atrial fibrillation (HCC)    Noted at [facility] 11/10/13 but resolved.   Bronchitis    DDD (degenerative disc disease), lumbar 12/01/2013   Gout    Hypertension    Sinoatrial node dysfunction (HCC) 11/10/2013   Varicose veins of legs    Wears dentures    full upper and lower   Wears hearing aid in both ears     Past Surgical History:  Procedure Laterality Date   BREAST BIOPSY Left 2011   NEG   CARDIAC CATHETERIZATION     over 20 yrs ago.  All OK.   CATARACT EXTRACTION W/PHACO Left 03/27/2020   Procedure: CATARACT EXTRACTION PHACO AND INTRAOCULAR LENS PLACEMENT (IOC) LEFT 7.49  00:56.4  13.3%;  Surgeon: Lockie Mola, MD;  Location: [facility];  Service: Ophthalmology;  Laterality: Left;   CATARACT EXTRACTION  W/PHACO Right 04/24/2020   Procedure: CATARACT EXTRACTION PHACO AND INTRAOCULAR LENS PLACEMENT (IOC) RIGHT;  Surgeon: Lockie Mola, MD;  Location: [facility];  Service: Ophthalmology;  Laterality: Right;  8.34 0:50.2 16.6%   PARTIAL HYSTERECTOMY      Medications Prior to Admission  Medication Sig Dispense Refill Last Dose   acetaminophen (TYLENOL) 500 MG tablet Take 500 mg by mouth every 6 (six) hours as needed.      amLODipine (NORVASC) 5 MG tablet Take 5 mg by mouth daily.      aspirin EC 81 MG tablet Take 81 mg by mouth daily. Swallow whole.      meloxicam (MOBIC) 15 MG tablet Take 15 mg by mouth daily.      probenecid (BENEMID) 500 MG tablet Take 500 mg by mouth 2 (two) times daily.      traMADol (ULTRAM) 50 MG tablet Take by mouth every 6 (six) hours as needed.      triamterene-hydrochlorothiazide (DYAZIDE) 37.5-25 MG capsule Take 1 capsule by mouth daily.      Social History   Socioeconomic History   Marital status: Married    Spouse name: Not on file   Number of children: Not on file   Years of education: Not on file   Highest education level: Not on file  Occupational History   Not on file  Tobacco Use   Smoking status: Former    Pack years: 0.00    Types: Cigarettes    Quit date: 1980    Years since quitting: 54.5  Smokeless tobacco: Never  Vaping Use   Vaping Use: Never used  Substance and Sexual Activity   Alcohol use: Never   Drug use: Never   Sexual activity: Not on file  Other Topics Concern   Not on file  Social History Narrative   Not on file   Social Determinants of Health   Financial Resource Strain: Not on file  Food Insecurity: Not on file  Transportation Needs: Not on file  Physical Activity: Not on file  Stress: Not on file  Social Connections: Not on file  Intimate Partner Violence: Not on file    Family History  Problem Relation Age of Onset   Breast cancer Neg Hx       Review of systems complete and found to be  negative unless listed above      PHYSICAL EXAM  General: Well developed, well nourished, in no acute distress HEENT:  Normocephalic and atramatic Neck:  No JVD.  Lungs: Clear bilaterally to auscultation and percussion. Heart: HRRR . Normal S1 and S2 without gallops or murmurs.  Abdomen: Bowel sounds are positive, abdomen soft and non-tender  Msk:  Back normal, normal gait. Normal strength and tone for age. Extremities: No clubbing, cyanosis or edema.   Neuro: Alert and oriented X 3. Psych:  Good affect, responds appropriately  Labs:   Lab Results  Component Value Date   WBC 9.1 01/11/2021   HGB 13.8 01/11/2021   HCT 41.0 01/11/2021   MCV 99.5 01/11/2021   PLT 294 01/11/2021    Recent Labs  Lab 01/11/21 1130  NA 138  K 3.5  CL 98  CO2 28  BUN 34*  CREATININE 1.39*  CALCIUM 11.2*  PROT 8.3*  BILITOT 0.7  ALKPHOS 86  ALT 17  AST 28  GLUCOSE 203*   No results found for: CKTOTAL, CKMB, CKMBINDEX, TROPONINI No results found for: CHOL No results found for: HDL No results found for: LDLCALC No results found for: TRIG No results found for: CHOLHDL No results found for: LDLDIRECT    Radiology: CARDIAC CATHETERIZATION  Result Date: 01/11/2021  Ost LM lesion is 40% stenosed.  Mid Cx lesion is 50% stenosed.  Ost Cx lesion is 30% stenosed.  Prox LAD lesion is 30% stenosed.  Mid LAD lesion is 30% stenosed.  Prox RCA lesion is 99% stenosed.  Prox RCA to Mid RCA lesion is 75% stenosed.  Dist RCA lesion is 60% stenosed.  A drug-eluting stent was successfully placed using a STENT RESOLUTE ONYX 2.75X30.  Post intervention, there is a 0% residual stenosis.  Post intervention, there is a 0% residual stenosis.  1.  Inferior STEMI 2.  Subtotal 99% stenosis proximal RCA 3.  Mildly reduced left ventricular function with inferior hypokinesis 4.  Successful primary PCI with 2.75 x 30 mm resolute Onyx drug-eluting stent mid and proximal RCA Recommendations 1.  Dual antiplatelet  therapy uninterrupted for 1 year 2.  Metoprolol tartrate 25 mg twice daily 3.  High intensity atorvastatin 80 mg daily 4.  2D echocardiogram    EKG: Sinus rhythm with ST elevation in leads III and aVF  ASSESSMENT AND PLAN:   1.  Inferior ST elevation myocardial infarction, with subtotaled proximal RCA, status post successful primary PCI with 2.75 x 30 mm Resolute Onyx stent 2.  Atrial fibrillation, rate controlled, unknown duration, CHA2DS2-VASc score 5, not on chronic anticoagulation 3.  Essential hypertension, on amlodipine and Maxide at home 4.  Type 2 diabetes  Recommendations  1.  Dual  antiplatelet therapy uninterrupted for 1 year 2.  Start metoprolol tartrate 25 mg twice daily 3.  Start atorvastatin 80 mg daily 4.  We will likely start Eliquis 5 mg twice daily during this hospitalization prior to discharge, continue triple therapy for 1 to 2 weeks 5.  2D echocardiogram  Signed: Marcina Millard MD,PhD, [facility] 01/11/2021, 1:09 PM

## 2021-01-12 ENCOUNTER — Encounter: Payer: Self-pay | Admitting: Cardiology

## 2021-01-12 ENCOUNTER — Inpatient Hospital Stay
Admit: 2021-01-12 | Discharge: 2021-01-12 | Disposition: A | Discharge status: Home / Self Care | Payer: Medicare Other | Attending: Internal Medicine | Admitting: Internal Medicine

## 2021-01-12 DIAGNOSIS — I2119 ST elevation (STEMI) myocardial infarction involving other coronary artery of inferior wall: Principal | ICD-10-CM

## 2021-01-12 DIAGNOSIS — I4891 Unspecified atrial fibrillation: Secondary | ICD-10-CM

## 2021-01-12 DIAGNOSIS — I5189 Other ill-defined heart diseases: Secondary | ICD-10-CM

## 2021-01-12 HISTORY — DX: Other ill-defined heart diseases: I51.89

## 2021-01-12 LAB — BASIC METABOLIC PANEL
Anion gap: 8 (ref 5–15)
BUN: 27 mg/dL — ABNORMAL HIGH (ref 8–23)
CO2: 28 mmol/L (ref 22–32)
Calcium: 9.6 mg/dL (ref 8.9–10.3)
Chloride: 101 mmol/L (ref 98–111)
Creatinine, Ser: 1.22 mg/dL — ABNORMAL HIGH (ref 0.44–1.00)
GFR, Estimated: 46 mL/min — ABNORMAL LOW (ref >60)
Glucose, Bld: 151 mg/dL — ABNORMAL HIGH (ref 70–99)
Potassium: 3.2 mmol/L — ABNORMAL LOW (ref 3.5–5.1)
Sodium: 137 mmol/L (ref 135–145)

## 2021-01-12 LAB — GLUCOSE, CAPILLARY
Glucose-Capillary: 150 mg/dL — ABNORMAL HIGH (ref 70–99)
Glucose-Capillary: 152 mg/dL — ABNORMAL HIGH (ref 70–99)
Glucose-Capillary: 174 mg/dL — ABNORMAL HIGH (ref 70–99)
Glucose-Capillary: 184 mg/dL — ABNORMAL HIGH (ref 70–99)
Glucose-Capillary: 163 mg/dL — ABNORMAL HIGH (ref 70–99)

## 2021-01-12 LAB — CBC
HCT: 34.6 % — ABNORMAL LOW (ref 36.0–46.0)
Hemoglobin: 12 g/dL (ref 12.0–15.0)
MCH: 34.3 pg — ABNORMAL HIGH (ref 26.0–34.0)
MCHC: 34.7 g/dL (ref 30.0–36.0)
MCV: 98.9 fL (ref 80.0–100.0)
Platelets: 259 10*3/uL (ref 150–400)
RBC: 3.5 MIL/uL — ABNORMAL LOW (ref 3.87–5.11)
RDW: 14.3 % (ref 11.5–15.5)
WBC: 8.4 10*3/uL (ref 4.0–10.5)
nRBC: 0 % (ref 0.0–0.2)

## 2021-01-12 LAB — TROPONIN I (HIGH SENSITIVITY): Troponin I (High Sensitivity): 5212 ng/L (ref <18)

## 2021-01-12 MED ORDER — SODIUM CHLORIDE 0.9 % IV SOLN: INTRAVENOUS | Status: DC

## 2021-01-12 MED ORDER — CHLORHEXIDINE GLUCONATE CLOTH 2 % EX PADS: 6.0000 | MEDICATED_PAD | Freq: Every day | CUTANEOUS | Status: DC

## 2021-01-12 MED ORDER — MORPHINE SULFATE (PF) 2 MG/ML IV SOLN
2.0000 mg | INTRAVENOUS | Status: DC | PRN
Start: 2021-01-12 — End: 2021-01-13

## 2021-01-12 MED ORDER — PROBENECID 500 MG PO TABS
500.0000 mg | ORAL_TABLET | Freq: Two times a day (BID) | ORAL | Status: DC
  Administered 2021-01-12 – 2021-01-13 (×4): 500 mg via ORAL
  Filled 2021-01-12 (×6): qty 1

## 2021-01-12 MED ORDER — SODIUM CHLORIDE 0.9% FLUSH
3.0000 mL | Freq: Two times a day (BID) | INTRAVENOUS | Status: DC
  Administered 2021-01-12 – 2021-01-13 (×4): 3 mL via INTRAVENOUS

## 2021-01-12 MED ORDER — INSULIN ASPART 100 UNIT/ML IJ SOLN
0.0000 [IU] | Freq: Three times a day (TID) | INTRAMUSCULAR | Status: DC
  Administered 2021-01-12 (×3): 2 [IU] via SUBCUTANEOUS
  Filled 2021-01-12 (×4): qty 1

## 2021-01-12 MED ORDER — MAGNESIUM CITRATE PO SOLN
1.0000 | Freq: Once | ORAL | Status: DC | PRN
  Filled 2021-01-12: qty 296

## 2021-01-12 MED ORDER — LOSARTAN POTASSIUM 25 MG PO TABS
25.0000 mg | ORAL_TABLET | Freq: Every day | ORAL | Status: DC
  Administered 2021-01-12 – 2021-01-13 (×2): 25 mg via ORAL
  Filled 2021-01-12 (×2): qty 1

## 2021-01-12 MED ORDER — SENNOSIDES-DOCUSATE SODIUM 8.6-50 MG PO TABS: 1.0000 | ORAL_TABLET | Freq: Every evening | ORAL | Status: DC | PRN

## 2021-01-12 MED ORDER — TRAMADOL HCL 50 MG PO TABS: 50.0000 mg | ORAL_TABLET | Freq: Four times a day (QID) | ORAL | Status: DC | PRN

## 2021-01-12 MED ORDER — TRAZODONE HCL 50 MG PO TABS
50.0000 mg | ORAL_TABLET | Freq: Every day | ORAL | Status: DC
  Administered 2021-01-12: 50 mg via ORAL
  Filled 2021-01-12: qty 1

## 2021-01-12 MED ORDER — BISACODYL 5 MG PO TBEC: 5.0000 mg | DELAYED_RELEASE_TABLET | Freq: Every day | ORAL | Status: DC | PRN

## 2021-01-12 NOTE — Progress Notes (Signed)
[facility] Cardiology  SUBJECTIVE: Patient laying in bed, denies chest pain or shortness of breath   Vitals:   01/12/21 0500 01/12/21 0600 01/12/21 0800 01/12/21 0840  BP: 128/76 136/84 133/78 133/78  Pulse: 70 (!) 59 67 69  Resp: 11 (!) 21 12   Temp:   97.9 F (36.6 C)   TempSrc:   Oral   SpO2: 95% 94% 100%   Weight: 93.3 kg     Height:         Intake/Output Summary (Last 24 hours) at 01/12/2021 0931 Last data filed at 01/12/2021 4268 Gross per 24 hour  Intake 1133 ml  Output 1000 ml  Net 133 ml      PHYSICAL EXAM  General: Well developed, well nourished, in no acute distress HEENT:  Normocephalic and atramatic Neck:  No JVD.  Lungs: Clear bilaterally to auscultation and percussion. Heart: HRRR . Normal S1 and S2 without gallops or murmurs.  Abdomen: Bowel sounds are positive, abdomen soft and non-tender  Msk:  Back normal, normal gait. Normal strength and tone for age. Extremities: No clubbing, cyanosis or edema.   Neuro: Alert and oriented X 3. Psych:  Good affect, responds appropriately   LABS: Basic Metabolic Panel: Recent Labs    01/11/21 1130 01/12/21 0543  NA 138 137  K 3.5 3.2*  CL 98 101  CO2 28 28  GLUCOSE 203* 151*  BUN 34* 27*  CREATININE 1.39* 1.22*  CALCIUM 11.2* 9.6   Liver Function Tests: Recent Labs    01/11/21 1130  AST 28  ALT 17  ALKPHOS 86  BILITOT 0.7  PROT 8.3*  ALBUMIN 4.0   No results for input(s): LIPASE, AMYLASE in the last 72 hours. CBC: Recent Labs    01/11/21 1130 01/12/21 0543  WBC 9.1 8.4  NEUTROABS 6.5  --   HGB 13.8 12.0  HCT 41.0 34.6*  MCV 99.5 98.9  PLT 294 259   Cardiac Enzymes: No results for input(s): CKTOTAL, CKMB, CKMBINDEX, TROPONINI in the last 72 hours. BNP: Invalid input(s): POCBNP D-Dimer: No results for input(s): DDIMER in the last 72 hours. Hemoglobin A1C: No results for input(s): HGBA1C in the last 72 hours. Fasting Lipid Panel: No results for input(s): CHOL, HDL, LDLCALC, TRIG,  CHOLHDL, LDLDIRECT in the last 72 hours. Thyroid Function Tests: No results for input(s): TSH, T4TOTAL, T3FREE, THYROIDAB in the last 72 hours.  Invalid input(s): FREET3 Anemia Panel: No results for input(s): VITAMINB12, FOLATE, FERRITIN, TIBC, IRON, RETICCTPCT in the last 72 hours.  CARDIAC CATHETERIZATION  Result Date: 01/11/2021  Ost LM lesion is 40% stenosed.  Mid Cx lesion is 50% stenosed.  Ost Cx lesion is 30% stenosed.  Prox LAD lesion is 30% stenosed.  Mid LAD lesion is 30% stenosed.  Prox RCA lesion is 99% stenosed.  Prox RCA to Mid RCA lesion is 75% stenosed.  Dist RCA lesion is 60% stenosed.  A drug-eluting stent was successfully placed using a STENT RESOLUTE ONYX 2.75X30.  Post intervention, there is a 0% residual stenosis.  Post intervention, there is a 0% residual stenosis.  1.  Inferior STEMI 2.  Subtotal 99% stenosis proximal RCA 3.  Mildly reduced left ventricular function with inferior hypokinesis 4.  Successful primary PCI with 2.75 x 30 mm resolute Onyx drug-eluting stent mid and proximal RCA Recommendations 1.  Dual antiplatelet therapy uninterrupted for 1 year 2.  Metoprolol tartrate 25 mg twice daily 3.  High intensity atorvastatin 80 mg daily 4.  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  Inferior ST elevation myocardial infarction, with subtotaled proximal RCA, status post successful primary PCI with 2.75 x 30 mm Resolute  Onyx stent 2.  Chronic atrial fibrillation, rate controlled, duration likely many years per patient, undocumented in chart, CHA2DS2-VASc score 5, not on chronic anticoagulation 3.  Essential hypertension, on amlodipine and Maxide at home, will change to metoprolol tartrate and losartan following STEMI 4.  Type 2 diabetes   Recommendations   1.  Dual antiplatelet therapy uninterrupted for 1 year 2.  Continue metoprolol tartrate 25 mg twice daily 3.  Continue atorvastatin 80 mg daily 4.  Start losartan 25 mg daily 5   Review 2D echocardiogram 6.  We will start Eliquis 5 mg twice daily as outpatient in 1 to 2 weeks 7.  May transfer to PCU/telemetry 8.  Probable DC home in a.m.     Marcina Millard, MD, PhD, [facility] 01/12/2021 9:31 AM

## 2021-01-12 NOTE — Progress Notes (Signed)
Progress Note    Brooke Cunasvelyn H Michalowski  BJY:[id number] DOB: [birth year]  DOA: 01/11/2021 PCP: Jerl MinaHedrick, James, MD      Brief Narrative:    Medical records reviewed and are as summarized below:  Brooke Davis is a 80 y.o. female with medical history significant for hypertension, type II DM, hyperlipidemia, paroxysmal atrial fibrillation, gout, arthritis, morbid obesity, presented to the hospital with chest pain.  Troponins were elevated.  She was found to have acute inferior STEMI.  She underwent emergent PCI with stent placement to proximal RCA.      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  (Morbid obesity)  Acute inferior STEMI: S/p PCI to proximal RCA.  Continue Lipitor, aspirin and Brilinta.  2D echo is pending.  Follow-up with cardiologist for further recommendations.  Paroxysmal atrial fibrillation: Continue metoprolol.  CHA2DS2-VASc score is 5.  Cardiologist plans to start Eliquis as an outpatient in about 1 to 2 weeks.  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  Creatinine is stable.  Discontinue IV fluids.  Type II DM: NovoLog as needed for hyperglycemia.  Hypertension: Triamterene-HCTZ and amlodipine on hold.  She has been started on losartan.   Diet Order             Diet 2 gram sodium Room service appropriate? Yes; Fluid consistency: Thin  Diet effective now                      Consultants: Cardiologist  Procedures: PCI on 01/11/2021    Medications:    aspirin  81 mg Oral Daily   atorvastatin   80 mg Oral Daily   Chlorhexidine Gluconate Cloth  6 each Topical Daily   insulin aspart  0-9 Units Subcutaneous TID WC   losartan  25 mg Oral Daily   metoprolol tartrate  25 mg Oral BID   probenecid  500 mg Oral BID   sodium chloride flush  3 mL Intravenous Q12H   sodium chloride flush  3 mL Intravenous Q12H   ticagrelor  90 mg Oral BID   traZODone  50 mg Oral QHS   Continuous Infusions:  sodium chloride       Anti-infectives (From admission, onward)    None              Family Communication/Anticipated D/C date and plan/Code Status   DVT prophylaxis: SCDs Start: 01/12/21 0013     Code Status: Full Code  Family Communication: Raynelle FanningJulie, daughter Disposition Plan:    Status is: Inpatient  Remains inpatient appropriate because:Inpatient level of care appropriate due to severity of illness  Dispo: The patient is from: Home              Anticipated d/c is to: Home              Patient currently is not medically stable to d/c.   Difficult to place patient No           Subjective:   C/o nausea and difficulty sleeping at night.  She wants  something to help her sleep.  No palpitations, dizziness, chest pain or shortness of breath.  Her daughter was at the bedside.  Objective:    Vitals:   01/12/21 0900 01/12/21 1000 01/12/21 1100 01/12/21 1200  BP: 121/61 132/67 (!) 102/52 (!) 116/58  Pulse: 78 (!) 53 72 77  Resp: 14 (!) 21 15 20   Temp:      TempSrc:      SpO2: 100% 99% 98% 99%  Weight:      Height:       No data found.   Intake/Output Summary (Last 24 hours) at 01/12/2021 1229 Last data filed at 01/12/2021 1209 Gross per 24 hour  Intake 2092.32 ml  Output 1000 ml  Net 1092.32 ml   Filed Weights   01/11/21 1301 01/12/21 0500  Weight: 93.4 kg 93.3 kg    Exam:  GEN: NAD SKIN: No rash EYES: EOMI ENT: MMM CV: RRR PULM: CTA B ABD: soft, obese, NT, +BS CNS: AAO x 3, non focal EXT: No edema or tenderness        Data Reviewed:    I have personally reviewed following labs and imaging studies:  Labs: Labs show the following:   Basic Metabolic Panel: Recent Labs  Lab 01/11/21 1130 01/12/21 0543  NA 138 137  K 3.5 3.2*  CL 98 101  CO2 28 28  GLUCOSE 203* 151*  BUN 34* 27*  CREATININE 1.39* 1.22*  CALCIUM 11.2* 9.6   GFR Estimated Creatinine Clearance: 41.9 mL/min (A) (by C-G formula based on SCr of 1.22 mg/dL (H)). Liver Function Tests: Recent Labs  Lab 01/11/21 1130  AST 28  ALT 17  ALKPHOS 86  BILITOT 0.7  PROT 8.3*  ALBUMIN 4.0   No results for input(s): LIPASE, AMYLASE in the last 168 hours. No results for input(s): AMMONIA in the last 168 hours. Coagulation profile Recent Labs  Lab 01/11/21 1130  INR 1.0    CBC: Recent Labs  Lab 01/11/21 1130 01/12/21 0543  WBC 9.1 8.4  NEUTROABS 6.5  --   HGB 13.8 12.0  HCT 41.0 34.6*  MCV 99.5 98.9  PLT 294 259   Cardiac Enzymes: No results for input(s): CKTOTAL, CKMB, CKMBINDEX, TROPONINI in the last 168 hours. BNP (last 3 results) No results for input(s): PROBNP in the last 8760 hours. CBG: Recent Labs  Lab 01/11/21 1312 01/12/21 0222 01/12/21 0807 01/12/21 1119  GLUCAP 170* 150* 152* 174*   D-Dimer: No results for input(s): DDIMER in the last 72 hours. Hgb A1c: No results for input(s): HGBA1C in the last 72 hours. Lipid Profile: No results for input(s): CHOL, HDL, LDLCALC, TRIG, CHOLHDL, LDLDIRECT in the last 72 hours. Thyroid function studies: No results for input(s): TSH, T4TOTAL, T3FREE, THYROIDAB in the last 72 hours.  Invalid input(s): FREET3 Anemia work up: No results for input(s): VITAMINB12, FOLATE, FERRITIN, TIBC, IRON, RETICCTPCT in the last 72 hours. Sepsis Labs: Recent Labs  Lab 01/11/21 1130 01/12/21 0543  WBC 9.1 8.4    Microbiology Recent Results (from the past 240 hour(s))  Resp Panel by RT-PCR (Flu A&B, Covid) Nasopharyngeal Swab     Status: None   Collection Time: 01/11/21 11:30 AM    Specimen: Nasopharyngeal Swab; Nasopharyngeal(NP) swabs in vial transport medium  Result Value Ref Range Status   SARS Coronavirus 2 by RT PCR NEGATIVE NEGATIVE Final    Comment: (NOTE) SARS-CoV-2 target nucleic acids are NOT DETECTED.  The SARS-CoV-2 RNA is generally detectable in upper respiratory specimens during the  acute phase of infection. The lowest concentration of SARS-CoV-2 viral copies this assay can detect is 138 copies/mL. A negative result does not preclude SARS-Cov-2 infection and should not be used as the sole basis for treatment or other patient management decisions. A negative result may occur with  improper specimen collection/handling, submission of specimen other than nasopharyngeal swab, presence of viral mutation(s) within the areas targeted by this assay, and inadequate number of viral copies(<138 copies/mL). A negative result must be combined with clinical observations, patient history, and epidemiological information. The expected result is Negative.  Fact Sheet for Patients:  BloggerCourse.com  Fact Sheet for Healthcare Providers:  SeriousBroker.it  This test is no t yet approved or cleared by the [place] FDA and  has been authorized for detection and/or diagnosis of SARS-CoV-2 by FDA under an Emergency Use Authorization (EUA). This EUA will remain  in effect (meaning this test can be used) for the duration of the COVID-19 declaration under Section 564(b)(1) of the Act, 21 U.S.C.section 360bbb-3(b)(1), unless the authorization is terminated  or revoked sooner.       Influenza A by PCR NEGATIVE NEGATIVE Final   Influenza B by PCR NEGATIVE NEGATIVE Final    Comment: (NOTE) The Xpert Xpress SARS-CoV-2/FLU/RSV plus assay is intended as an aid in the diagnosis of influenza from Nasopharyngeal swab specimens and should not be used as a sole basis for treatment. Nasal washings and aspirates are  unacceptable for Xpert Xpress SARS-CoV-2/FLU/RSV testing.  Fact Sheet for Patients: BloggerCourse.com  Fact Sheet for Healthcare Providers: SeriousBroker.it  This test is not yet approved or cleared by the [place] FDA and has been authorized for detection and/or diagnosis of SARS-CoV-2 by FDA under an Emergency Use Authorization (EUA). This EUA will remain in effect (meaning this test can be used) for the duration of the COVID-19 declaration under Section 564(b)(1) of the Act, 21 U.S.C. section 360bbb-3(b)(1), unless the authorization is terminated or revoked.  Performed at [facility], [address], [place], [place] [address]   MRSA culture     Status: None (Preliminary result)   Collection Time: 01/11/21  1:16 PM   Specimen: Body Fluid  Result Value Ref Range Status   Specimen Description   Final    NASAL SWAB Performed at [facility], [address], [place], [place] [address]    Special Requests   Final    NONE Performed at [facility], [address], [place], [place] [address]    Culture   Final    TOO YOUNG TO READ Performed at [facility], [address], [place], [place] [address]    Report Status PENDING  Incomplete    Procedures and diagnostic studies:  CARDIAC CATHETERIZATION  Result Date: 01/11/2021  Ost LM lesion is 40% stenosed.  Mid Cx lesion is 50% stenosed.  Ost Cx lesion is 30% stenosed.  Prox LAD lesion is 30% stenosed.  Mid LAD lesion is 30% stenosed.  Prox RCA lesion is 99% stenosed.  Prox RCA to Mid RCA lesion is 75% stenosed.  Dist RCA lesion is 60% stenosed.  A drug-eluting stent was successfully placed using a STENT RESOLUTE ONYX 2.75X30.  Post intervention, there is a 0% residual stenosis.  Post intervention, there is a 0% residual stenosis.  1.  Inferior STEMI 2.  Subtotal 99% stenosis proximal RCA 3.  Mildly reduced left ventricular  function with inferior hypokinesis 4.  Successful primary PCI with 2.75 x 30 mm resolute Onyx  drug-eluting stent mid and proximal RCA Recommendations 1.  Dual antiplatelet therapy uninterrupted for 1 year 2.  Metoprolol tartrate 25 mg twice daily 3.  High intensity atorvastatin 80 mg daily 4.  2D echocardiogram               LOS: 1 day   Chenoa Luddy  [facility]   Pager on [email or web address]. If 7PM-7AM, please contact night-coverage at [email or web address]     01/12/2021, 12:29 PM

## 2021-01-12 NOTE — Progress Notes (Signed)
Patient is s/p stents placed for RCA, TR band has been removed, transparent dressing in place at this time. Minor bruising noted at the site. Extremity kept elevated above the heart. Patient accepted the atorvastatin after extensive education. Patient c/o back pain from lying on the back for to long. Patient requested PRN tylenol and refused any need for morphine. Patient denied chest pain, or other discomfort. Purewick placed. Patient continues in controlled afib. Patient is awake and alert watching TV at this time. Per patient she usually does not sleep well at night. Safety maintained, assisted with turning and repositioning. Call bell and personal items kept within reach. Endorsed to receiving RN.

## 2021-01-12 NOTE — Progress Notes (Signed)
Pt A&OX4. Symmetrical in all extremities. R radial vascular site has bruising but is otherwise unremarkable. Pt denies pain. Nausea resolved with eating her breakfast. HR drops to 48-55 intermittantly but resolves. Per cardiology pt is asymptomatic and continue to monitor. Pt did receive 25 mg metoprolol this AM when HR 66-70. Pt in no distress at this time. Pt wheeled to rm 235. Report given at bedside to [place] RN.

## 2021-01-12 NOTE — Progress Notes (Signed)
*  PRELIMINARY RESULTS* Echocardiogram 2D Echocardiogram has been performed.  Neita Garnet Hermine Feria 01/12/2021, 1:23 PM

## 2021-01-13 ENCOUNTER — Encounter: Payer: Self-pay | Admitting: Cardiology

## 2021-01-13 LAB — BASIC METABOLIC PANEL
Anion gap: 7 (ref 5–15)
BUN: 29 mg/dL — ABNORMAL HIGH (ref 8–23)
CO2: 25 mmol/L (ref 22–32)
Calcium: 8.9 mg/dL (ref 8.9–10.3)
Chloride: 102 mmol/L (ref 98–111)
Creatinine, Ser: 1.27 mg/dL — ABNORMAL HIGH (ref 0.44–1.00)
GFR, Estimated: 44 mL/min — ABNORMAL LOW (ref >60)
Glucose, Bld: 186 mg/dL — ABNORMAL HIGH (ref 70–99)
Potassium: 3.6 mmol/L (ref 3.5–5.1)
Sodium: 134 mmol/L — ABNORMAL LOW (ref 135–145)

## 2021-01-13 LAB — GLUCOSE, CAPILLARY: Glucose-Capillary: 155 mg/dL — ABNORMAL HIGH (ref 70–99)

## 2021-01-13 MED ORDER — TICAGRELOR 90 MG PO TABS: 90.0000 mg | ORAL_TABLET | Freq: Two times a day (BID) | ORAL | 0 refills | Status: DC

## 2021-01-13 MED ORDER — METOPROLOL TARTRATE 25 MG PO TABS: 25.0000 mg | ORAL_TABLET | Freq: Two times a day (BID) | ORAL | 0 refills | Status: DC

## 2021-01-13 MED ORDER — ATORVASTATIN CALCIUM 80 MG PO TABS: 80.0000 mg | ORAL_TABLET | Freq: Every day | ORAL | 0 refills | Status: DC

## 2021-01-13 MED ORDER — POTASSIUM CHLORIDE CRYS ER 20 MEQ PO TBCR
40.0000 meq | EXTENDED_RELEASE_TABLET | Freq: Once | ORAL | Status: AC
  Administered 2021-01-13: 40 meq via ORAL
  Filled 2021-01-13: qty 2

## 2021-01-13 MED ORDER — LOSARTAN POTASSIUM 25 MG PO TABS: 25.0000 mg | ORAL_TABLET | Freq: Every day | ORAL | 0 refills | Status: DC

## 2021-01-13 NOTE — Progress Notes (Signed)
[facility]    SUBJECTIVE: The patient reports feeling well this morning and is eager to go home. She denies chest pain or shortness of breath.   Vitals:   01/13/21 0454 01/13/21 0500 01/13/21 0737 01/13/21 0744  BP: (!) 149/78  138/75 (!) 116/55  Pulse: 75  74 78  Resp: 15  18 16   Temp: 98.1 F (36.7 C)  98.1 F (36.7 C) 98.3 F (36.8 C)  TempSrc:    Oral  SpO2: 98%  100% 99%  Weight:  96.8 kg    Height:         Intake/Output Summary (Last 24 hours) at 01/13/2021 01/15/2021 Last data filed at 01/13/2021 01/15/2021 Gross per 24 hour  Intake 402.76 ml  Output 0 ml  Net 402.76 ml      PHYSICAL EXAM  General: Well developed, well nourished, sitting up in bed in no acute distress HEENT:  Normocephalic and atramatic Neck:  No JVD.  Lungs: normal effort of breathing on room air Heart: irregularly irregular, without gallops or murmurs.  Abdomen: nondistended Extremities: No clubbing, cyanosis or edema.   Neuro: Alert and oriented X 3. Psych:  Good affect, responds appropriately   LABS: Basic Metabolic Panel: Recent Labs    01/11/21 1130 01/12/21 0543  NA 138 137  K 3.5 3.2*  CL 98 101  CO2 28 28  GLUCOSE 203* 151*  BUN 34* 27*  CREATININE 1.39* 1.22*  CALCIUM 11.2* 9.6   Liver Function Tests: Recent Labs    01/11/21 1130  AST 28  ALT 17  ALKPHOS 86  BILITOT 0.7  PROT 8.3*  ALBUMIN 4.0   No results for input(s): LIPASE, AMYLASE in the last 72 hours. CBC: Recent Labs    01/11/21 1130 01/12/21 0543  WBC 9.1 8.4  NEUTROABS 6.5  --   HGB 13.8 12.0  HCT 41.0 34.6*  MCV 99.5 98.9  PLT 294 259   Cardiac Enzymes: No results for input(s): CKTOTAL, CKMB, CKMBINDEX, TROPONINI in the last 72 hours. BNP: Invalid input(s): POCBNP D-Dimer: No results for input(s): DDIMER in the last 72 hours. Hemoglobin A1C: No results for input(s): HGBA1C in the last 72 hours. Fasting Lipid Panel: No results for input(s): CHOL, HDL, LDLCALC, TRIG, CHOLHDL, LDLDIRECT in the  last 72 hours. Thyroid Function Tests: No results for input(s): TSH, T4TOTAL, T3FREE, THYROIDAB in the last 72 hours.  Invalid input(s): FREET3 Anemia Panel: No results for input(s): VITAMINB12, FOLATE, FERRITIN, TIBC, IRON, RETICCTPCT in the last 72 hours.  CARDIAC CATHETERIZATION  Result Date: 01/11/2021  Ost LM lesion is 40% stenosed.  Mid Cx lesion is 50% stenosed.  Ost Cx lesion is 30% stenosed.  Prox LAD lesion is 30% stenosed.  Mid LAD lesion is 30% stenosed.  Prox RCA lesion is 99% stenosed.  Prox RCA to Mid RCA lesion is 75% stenosed.  Dist RCA lesion is 60% stenosed.  A drug-eluting stent was successfully placed using a STENT RESOLUTE ONYX 2.75X30.  Post intervention, there is a 0% residual stenosis.  Post intervention, there is a 0% residual stenosis.  1.  Inferior STEMI 2.  Subtotal 99% stenosis proximal RCA 3.  Mildly reduced left ventricular function with inferior hypokinesis 4.  Successful primary PCI with 2.75 x 30 mm resolute Onyx drug-eluting stent mid and proximal RCA Recommendations 1.  Dual antiplatelet therapy uninterrupted for 1 year 2.  Metoprolol tartrate 25 mg twice daily 3.  High intensity atorvastatin 80 mg daily 4.  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 Inferior ST elevation myocardial infarction, with subtotaled proximal RCA, status post successful primary PCI with 2.75 x 30 mm Resolute Onyx stent 2.  Chronic atrial fibrillation, rate  controlled, duration likely many years per patient, undocumented in chart, CHA2DS2-VASc score 5, not on chronic anticoagulation 3.  Essential hypertension, on amlodipine and Maxide at home, changed to metoprolol tartrate and losartan following STEMI 4.  Type 2 diabetes  Recommendations: Continue DAPT (aspirin and Brilinta) uninterrupted x 1 year Continue high intensity statin Continue metoprolol tartrate and losartan Follow-up with Dr. Darrold Junker in 1 week at which time chronic anticoagulation will likely be initiated. Recommend discharge today.   Brooke Ivanoff, PA-C 01/13/2021 8:19 AM

## 2021-01-13 NOTE — Progress Notes (Signed)
Patient alert and oriented, vss, no complaints of pain.  D/c home with grandson.    D/c telemetry and piv.  Escorted out of hospital via wheelchair by volunteers.    Given vascular discharge instructions and brillinta coupon

## 2021-01-13 NOTE — Discharge Summary (Signed)
Physician Discharge Summary  Brooke Davis PPI:[id number] DOB: April 10, 1941 DOA: 01/11/2021  PCP: [name], MD  Admit date: 01/11/2021 Discharge date: 01/13/2021  Discharge disposition: Home   Recommendations for Outpatient Follow-Up:   Follow-up with Dr. 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  Diet recommendation:  Diet Order             Diet - low sodium heart healthy           Diet 2 gram sodium Room service appropriate? Yes; Fluid consistency: Thin  Diet effective now                     Code Status: Full Code     Hospital Course:   Brooke Davis is a 80 y.o. female with medical history significant for hypertension, type II DM, hyperlipidemia, paroxysmal atrial fibrillation, gout, arthritis, morbid obesity, presented to the hospital with chest pain.  Troponins were elevated.  She was found to have acute inferior STEMI.  She underwent emergent PCI with stent placement to proximal RCA.  Her condition has improved and she is deemed stable for discharge to home today.  Atorvastatin, metoprolol, losartan and dual antiplatelet therapy with aspirin and Brilinta were recommended at discharge by cardiologist.  Discharge plan and no new medications were discussed with the patient.        Medical Consultants:   Cardiologist   Discharge Exam:    Vitals:   01/13/21 0454 01/13/21 0500 01/13/21 0737 01/13/21 0744  BP: (!) 149/78  138/75 (!) 116/55   Pulse: 75  74 78  Resp: 15  18 16   Temp: 98.1 F (36.7 C)  98.1 F (36.7 C) 98.3 F (36.8 C)  TempSrc:    Oral  SpO2: 98%  100% 99%  Weight:  96.8 kg    Height:         GEN: NAD SKIN: Warm and dry EYES: EOMI ENT: MMM CV: RRR PULM: CTA B ABD: soft, ND, NT, +BS CNS: AAO x 3, non focal EXT: No edema or tenderness   The results of significant diagnostics from this hospitalization (including imaging, microbiology, ancillary and laboratory) are listed below for reference.     Procedures and Diagnostic Studies:   No results found.   Labs:   Basic Metabolic Panel: Recent Labs  Lab 01/11/21 1130 01/12/21 0543 01/13/21 1033  NA 138 137 134*  K 3.5 3.2* 3.6  CL 98 101 102  CO2 28 28 25   GLUCOSE 203* 151* 186*  BUN 34* 27* 29*  CREATININE 1.39* 1.22* 1.27*  CALCIUM 11.2* 9.6 8.9   GFR Estimated Creatinine Clearance: 41.1 mL/min (A) (by C-G formula based on SCr of 1.27 mg/dL (H)). Liver Function Tests: Recent Labs  Lab 01/11/21 1130  AST 28  ALT 17  ALKPHOS 86  BILITOT 0.7  PROT 8.3*  ALBUMIN 4.0   No results for input(s): LIPASE, AMYLASE in the last 168 hours. No results for input(s): AMMONIA in the last 168 hours. Coagulation profile Recent  Labs  Lab 01/11/21 1130  INR 1.0    CBC: Recent Labs  Lab 01/11/21 1130 01/12/21 0543  WBC 9.1 8.4  NEUTROABS 6.5  --   HGB 13.8 12.0  HCT 41.0 34.6*  MCV 99.5 98.9  PLT 294 259   Cardiac Enzymes: No results for input(s): CKTOTAL, CKMB, CKMBINDEX, TROPONINI in the last 168 hours. BNP: Invalid input(s): POCBNP CBG: Recent Labs  Lab 01/12/21 0807 01/12/21 1119 01/12/21 1655 01/12/21 2050 01/13/21 0735  GLUCAP 152* 174* 184* 163* 155*   D-Dimer No results for input(s): DDIMER in the last 72 hours. Hgb A1c No results for input(s): HGBA1C in the last 72 hours. Lipid Profile No results for input(s): CHOL, HDL, LDLCALC, TRIG, CHOLHDL, LDLDIRECT in the last 72 hours. Thyroid function studies No  results for input(s): TSH, T4TOTAL, T3FREE, THYROIDAB in the last 72 hours.  Invalid input(s): FREET3 Anemia work up No results for input(s): VITAMINB12, FOLATE, FERRITIN, TIBC, IRON, RETICCTPCT in the last 72 hours. Microbiology Recent Results (from the past 240 hour(s))  Resp Panel by RT-PCR (Flu A&B, Covid) Nasopharyngeal Swab     Status: None   Collection Time: 01/11/21 11:30 AM   Specimen: Nasopharyngeal Swab; Nasopharyngeal(NP) swabs in vial transport medium  Result Value Ref Range Status   SARS Coronavirus 2 by RT PCR NEGATIVE NEGATIVE Final    Comment: (NOTE) SARS-CoV-2 target nucleic acids are NOT DETECTED.  The SARS-CoV-2 RNA is generally detectable in upper respiratory specimens during the acute phase of infection. The lowest concentration of SARS-CoV-2 viral copies this assay can detect is 138 copies/mL. A negative result does not preclude SARS-Cov-2 infection and should not be used as the sole basis for treatment or other patient management decisions. A negative result may occur with  improper specimen collection/handling, submission of specimen other than nasopharyngeal swab, presence of viral mutation(s) within the areas targeted by this assay, and inadequate number of viral copies(<138 copies/mL). A negative result must be combined with clinical observations, patient history, and epidemiological information. The expected result is Negative.  Fact Sheet for Patients:  BloggerCourse.com  Fact Sheet for Healthcare Providers:  SeriousBroker.it  This test is no t yet approved or cleared by the [place] FDA and  has been authorized for detection and/or diagnosis of SARS-CoV-2 by FDA under an Emergency Use Authorization (EUA). This EUA will remain  in effect (meaning this test can be used) for the duration of the COVID-19 declaration under Section 564(b)(1) of the Act, 21 U.S.C.section 360bbb-3(b)(1), unless the  authorization is terminated  or revoked sooner.       Influenza A by PCR NEGATIVE NEGATIVE Final   Influenza B by PCR NEGATIVE NEGATIVE Final    Comment: (NOTE) The Xpert Xpress SARS-CoV-2/FLU/RSV plus assay is intended as an aid in the diagnosis of influenza from Nasopharyngeal swab specimens and should not be used as a sole basis for treatment. Nasal washings and aspirates are unacceptable for Xpert Xpress SARS-CoV-2/FLU/RSV testing.  Fact Sheet for Patients: BloggerCourse.com  Fact Sheet for Healthcare Providers: SeriousBroker.it  This test is not yet approved or cleared by the [place] FDA and has been authorized for detection and/or diagnosis of SARS-CoV-2 by FDA under an Emergency Use Authorization (EUA). This EUA will remain in effect (meaning this test can be used) for the duration of the COVID-19 declaration under Section 564(b)(1) of the Act, 21 U.S.C. section 360bbb-3(b)(1), unless the authorization is terminated or revoked.  Performed at [facility], [address], [place], [place]  [address]   MRSA culture     Status: None (Preliminary result)   Collection Time: 01/11/21  1:16 PM   Specimen: Body Fluid  Result Value Ref Range Status   Specimen Description   Final    NASAL SWAB Performed at [facility], [address] [place], [place] [address]    Special Requests   Final    NONE Performed at [facility], [address] [place], [place] [address]    Culture   Final    FEW STAPHYLOCOCCUS AUREUS SUSCEPTIBILITIES TO FOLLOW Performed at [facility], [address] [place], [place] [address]    Report Status PENDING  Incomplete     Discharge Instructions:   Discharge Instructions     AMB Referral to Cardiac Rehabilitation - Phase II   Complete by: As directed    Diagnosis:  STEMI Coronary Stents     After initial evaluation and assessments completed:  Virtual Based Care may be provided alone or in conjunction with Phase 2 Cardiac Rehab based on patient barriers.: Yes   Diet - low sodium heart healthy   Complete by: As directed    Increase activity slowly   Complete by: As directed       Allergies as of 01/13/2021       Reactions   Celebrex [celecoxib] Diarrhea   Mobic [meloxicam] Diarrhea   Mobic brand only.  OK with generic meloxicam.   Penicillins Itching   Relafen [nabumetone]    Palpitation        Medication List     STOP taking these medications    amLODipine 5 MG tablet Commonly known as: NORVASC   meloxicam 15 MG tablet Commonly known as: MOBIC   triamterene-hydrochlorothiazide 37.5-25 MG capsule Commonly known as: DYAZIDE       TAKE these medications    acetaminophen 500 MG tablet Commonly known as: TYLENOL Take 500 mg by mouth every 6 (six) hours as needed.   aspirin EC 81 MG tablet Take 81 mg by mouth daily. Swallow whole.   atorvastatin 80 MG tablet Commonly known as: LIPITOR Take 1 tablet (80 mg total) by mouth daily. Start taking on: January 14, 2021   losartan 25 MG tablet Commonly known as: COZAAR Take 1 tablet (25 mg total) by mouth daily. Start taking on: January 14, 2021   metoprolol tartrate 25 MG tablet Commonly known as: LOPRESSOR Take 1 tablet (25 mg total) by mouth 2 (two) times daily.   probenecid 500 MG tablet Commonly known as: BENEMID Take 500 mg by mouth 2 (two) times daily.   ticagrelor 90 MG Tabs tablet Commonly known as: BRILINTA Take 1 tablet (90 mg total) by mouth 2 (two) times daily.   traMADol 50 MG tablet Commonly known as: ULTRAM Take by mouth every 6 (six) hours as needed.        Follow-up Information     Paraschos, Alexander, MD. Go in 1 week(s).   Specialty: Cardiology Contact information: [address] [facility] [place] [address] [phone]                  Time coordinating discharge: 32  minutes  Signed:  Lurene Shadow  [facility] 01/13/2021, 11:09 AM   Pager on [email or web address]. If 7PM-7AM, please contact night-coverage at [email or web address]

## 2021-01-14 LAB — ECHOCARDIOGRAM COMPLETE
AR max vel: 1.17 cm2
AV Area VTI: 1.19 cm2
AV Area mean vel: 1.25 cm2
AV Mean grad: 8.7 mmHg
AV Peak grad: 17.3 mmHg
Ao pk vel: 2.08 m/s
Area-P 1/2: 3.54 cm2
Height: 63 in
S' Lateral: 3 cm
Weight: 3291.03 oz

## 2021-01-14 LAB — MRSA CULTURE

## 2021-01-14 LAB — CALCIUM, IONIZED
Calcium, Ionized, Serum: 5.6 mg/dL (ref 4.5–5.6)
Calcium, Ionized, Serum: 5.6 mg/dL (ref 4.5–5.6)

## 2021-01-27 ENCOUNTER — Other Ambulatory Visit: Payer: Self-pay | Admitting: Family Medicine

## 2021-01-27 DIAGNOSIS — Z1231 Encounter for screening mammogram for malignant neoplasm of breast: Secondary | ICD-10-CM

## 2021-02-18 ENCOUNTER — Encounter (HOSPITAL_COMMUNITY): Payer: Self-pay

## 2021-02-18 ENCOUNTER — Other Ambulatory Visit: Payer: Self-pay

## 2021-02-18 ENCOUNTER — Emergency Department (HOSPITAL_COMMUNITY): Payer: Medicare Other

## 2021-02-18 ENCOUNTER — Emergency Department (HOSPITAL_COMMUNITY)
Admission: EM | Admit: 2021-02-18 | Discharge: 2021-02-18 | Disposition: A | Discharge status: Home / Self Care | Payer: Medicare Other | Attending: Emergency Medicine | Admitting: Emergency Medicine

## 2021-02-18 DIAGNOSIS — R0602 Shortness of breath: Secondary | ICD-10-CM | POA: Diagnosis present

## 2021-02-18 DIAGNOSIS — I25110 Atherosclerotic heart disease of native coronary artery with unstable angina pectoris: Secondary | ICD-10-CM | POA: Diagnosis not present

## 2021-02-18 DIAGNOSIS — Z7901 Long term (current) use of anticoagulants: Secondary | ICD-10-CM | POA: Diagnosis not present

## 2021-02-18 DIAGNOSIS — R0609 Other forms of dyspnea: Secondary | ICD-10-CM | POA: Diagnosis not present

## 2021-02-18 DIAGNOSIS — I4891 Unspecified atrial fibrillation: Secondary | ICD-10-CM | POA: Insufficient documentation

## 2021-02-18 DIAGNOSIS — R102 Pelvic and perineal pain: Secondary | ICD-10-CM | POA: Diagnosis not present

## 2021-02-18 DIAGNOSIS — Z955 Presence of coronary angioplasty implant and graft: Secondary | ICD-10-CM | POA: Insufficient documentation

## 2021-02-18 DIAGNOSIS — E119 Type 2 diabetes mellitus without complications: Secondary | ICD-10-CM | POA: Diagnosis not present

## 2021-02-18 DIAGNOSIS — M7989 Other specified soft tissue disorders: Secondary | ICD-10-CM | POA: Diagnosis not present

## 2021-02-18 DIAGNOSIS — R600 Localized edema: Secondary | ICD-10-CM | POA: Diagnosis not present

## 2021-02-18 DIAGNOSIS — Z87891 Personal history of nicotine dependence: Secondary | ICD-10-CM | POA: Insufficient documentation

## 2021-02-18 DIAGNOSIS — I10 Essential (primary) hypertension: Secondary | ICD-10-CM | POA: Insufficient documentation

## 2021-02-18 DIAGNOSIS — R42 Dizziness and giddiness: Secondary | ICD-10-CM | POA: Insufficient documentation

## 2021-02-18 DIAGNOSIS — Z79899 Other long term (current) drug therapy: Secondary | ICD-10-CM | POA: Insufficient documentation

## 2021-02-18 DIAGNOSIS — Z7982 Long term (current) use of aspirin: Secondary | ICD-10-CM | POA: Insufficient documentation

## 2021-02-18 DIAGNOSIS — R0600 Dyspnea, unspecified: Secondary | ICD-10-CM

## 2021-02-18 LAB — COMPREHENSIVE METABOLIC PANEL
ALT: 22 U/L (ref 0–44)
AST: 26 U/L (ref 15–41)
Albumin: 4.1 g/dL (ref 3.5–5.0)
Alkaline Phosphatase: 118 U/L (ref 38–126)
Anion gap: 11 (ref 5–15)
BUN: 16 mg/dL (ref 8–23)
CO2: 24 mmol/L (ref 22–32)
Calcium: 9.8 mg/dL (ref 8.9–10.3)
Chloride: 104 mmol/L (ref 98–111)
Creatinine, Ser: 1.11 mg/dL — ABNORMAL HIGH (ref 0.44–1.00)
GFR, Estimated: 51 mL/min — ABNORMAL LOW (ref >60)
Glucose, Bld: 192 mg/dL — ABNORMAL HIGH (ref 70–99)
Potassium: 3.6 mmol/L (ref 3.5–5.1)
Sodium: 139 mmol/L (ref 135–145)
Total Bilirubin: 0.8 mg/dL (ref 0.3–1.2)
Total Protein: 8.1 g/dL (ref 6.5–8.1)

## 2021-02-18 LAB — CBC WITH DIFFERENTIAL/PLATELET
Abs Immature Granulocytes: 0.03 10*3/uL (ref 0.00–0.07)
Basophils Absolute: 0.1 10*3/uL (ref 0.0–0.1)
Basophils Relative: 1 %
Eosinophils Absolute: 0.1 10*3/uL (ref 0.0–0.5)
Eosinophils Relative: 2 %
HCT: 35.8 % — ABNORMAL LOW (ref 36.0–46.0)
Hemoglobin: 11.4 g/dL — ABNORMAL LOW (ref 12.0–15.0)
Immature Granulocytes: 0 %
Lymphocytes Relative: 14 %
Lymphs Abs: 1 10*3/uL (ref 0.7–4.0)
MCH: 33.9 pg (ref 26.0–34.0)
MCHC: 31.8 g/dL (ref 30.0–36.0)
MCV: 106.5 fL — ABNORMAL HIGH (ref 80.0–100.0)
Monocytes Absolute: 0.3 10*3/uL (ref 0.1–1.0)
Monocytes Relative: 4 %
Neutro Abs: 5.6 10*3/uL (ref 1.7–7.7)
Neutrophils Relative %: 79 %
Platelets: 276 10*3/uL (ref 150–400)
RBC: 3.36 MIL/uL — ABNORMAL LOW (ref 3.87–5.11)
RDW: 15.9 % — ABNORMAL HIGH (ref 11.5–15.5)
WBC: 7 10*3/uL (ref 4.0–10.5)
nRBC: 0 % (ref 0.0–0.2)

## 2021-02-18 LAB — I-STAT CHEM 8, ED
BUN: 16 mg/dL (ref 8–23)
Calcium, Ion: 1.23 mmol/L (ref 1.15–1.40)
Chloride: 106 mmol/L (ref 98–111)
Creatinine, Ser: 0.9 mg/dL (ref 0.44–1.00)
Glucose, Bld: 170 mg/dL — ABNORMAL HIGH (ref 70–99)
HCT: 34 % — ABNORMAL LOW (ref 36.0–46.0)
Hemoglobin: 11.6 g/dL — ABNORMAL LOW (ref 12.0–15.0)
Potassium: 3.8 mmol/L (ref 3.5–5.1)
Sodium: 143 mmol/L (ref 135–145)
TCO2: 27 mmol/L (ref 22–32)

## 2021-02-18 LAB — BRAIN NATRIURETIC PEPTIDE: B Natriuretic Peptide: 275.5 pg/mL — ABNORMAL HIGH (ref 0.0–100.0)

## 2021-02-18 MED ORDER — ALBUTEROL SULFATE HFA 108 (90 BASE) MCG/ACT IN AERS
2.0000 | INHALATION_SPRAY | RESPIRATORY_TRACT | Status: DC | PRN
  Administered 2021-02-18: 2 via RESPIRATORY_TRACT
  Filled 2021-02-18: qty 6.7

## 2021-02-18 MED ORDER — FUROSEMIDE 20 MG PO TABS: 20.0000 mg | ORAL_TABLET | Freq: Every day | ORAL | 0 refills | Status: DC

## 2021-02-18 MED ORDER — METOPROLOL TARTRATE 25 MG PO TABS
25.0000 mg | ORAL_TABLET | Freq: Once | ORAL | Status: AC
  Administered 2021-02-18: 25 mg via ORAL
  Filled 2021-02-18: qty 1

## 2021-02-18 MED ORDER — FUROSEMIDE 10 MG/ML IJ SOLN
20.0000 mg | Freq: Once | INTRAMUSCULAR | Status: AC
  Administered 2021-02-18: 20 mg via INTRAVENOUS
  Filled 2021-02-18: qty 4

## 2021-02-18 MED ORDER — LOSARTAN POTASSIUM 25 MG PO TABS
25.0000 mg | ORAL_TABLET | Freq: Once | ORAL | Status: AC
  Administered 2021-02-18: 25 mg via ORAL
  Filled 2021-02-18: qty 1

## 2021-02-18 NOTE — ED Provider Notes (Signed)
[facility]-EMERGENCY DEPT Provider Note   CSN: [id number] Arrival date & time: 02/18/21  1126     History Chief Complaint  Patient presents with   Shortness of Breath   Near Syncope    Brooke Davis is a 80 y.o. female.   Shortness of Breath Associated symptoms: no abdominal pain, no chest pain, no cough, no ear pain, no fever, no neck pain, no rash, no sore throat and no vomiting   Near Syncope Associated symptoms include shortness of breath (Exertional). Pertinent negatives include no chest pain and no abdominal pain.  Patient has had dyspnea with exertion over the past month.  She also endorses lower extremity swelling and dizziness and lightheadedness with exertion.  She had an MRI approximately 1 month ago.  Since that time, she has been on DAPT and antihypertensive medications.  Her last follow-up with cardiology was on 7/5.  Her next scheduled follow-up appointment is on 8/16.  She has been compliant with her medications.  Symptoms have persisted but not necessarily worsened over the past month.  She lives at home with her husband.  Her grandson is often there to assist her as well.  She has been walking with a walker or cane.  She is not currently on any diuretic medications.  She did have a post MI echocardiogram.  Per chart review, at this time, EF was 55 to 60%.  She did have mild LV dilation and grade 1 diastolic dysfunction (impaired relaxation).  She denies any areas of pain, including any chest discomfort.    Past Medical History:  Diagnosis Date   Arthritis    fingers, right hip   Atrial fibrillation (HCC)    Noted at [facility] 11/10/13 before knee surgery; thought to be due to Relafen; Afib stopped after Relafen was discontinued.   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  All OK.   CATARACT EXTRACTION W/PHACO Left 03/27/2020   Procedure: CATARACT EXTRACTION PHACO AND INTRAOCULAR LENS PLACEMENT (IOC) LEFT 7.49  00:56.4  13.3%;  Surgeon: Lockie Mola, MD;  Location: [facility];  Service: Ophthalmology;  Laterality: Left;   CATARACT EXTRACTION W/PHACO Right 04/24/2020   Procedure: CATARACT EXTRACTION PHACO AND INTRAOCULAR LENS PLACEMENT (IOC) RIGHT;  Surgeon: Lockie Mola, MD;  Location: [facility];  Service: Ophthalmology;  Laterality: Right;  8.34 0:50.2 16.6%   CORONARY/GRAFT ACUTE MI REVASCULARIZATION N/A 01/11/2021   Procedure: Coronary/Graft Acute MI Revascularization;  Surgeon: Marcina Millard, MD;  Location:  [facility];  Service: Cardiovascular;  Laterality: N/A;   LEFT HEART CATH AND CORONARY ANGIOGRAPHY N/A 01/11/2021   Procedure: LEFT HEART CATH AND CORONARY ANGIOGRAPHY;  Surgeon: Marcina MillardParaschos, Alexander, MD;  Location: [facility];  Service: Cardiovascular;  Laterality: N/A;   PARTIAL HYSTERECTOMY       OB History   No obstetric history on file.     Family History  Problem Relation Age of Onset   Heart attack  Mother [id number]       required open heart surgery. Onset age is unknown.   Stroke Father    Diabetes Father    Alcoholism Brother    Breast cancer Neg Hx     Social History   Tobacco Use   Smoking status: Former    Types: Cigarettes    Quit date: 1980    Years since quitting: 42.6   Smokeless tobacco: Never  Vaping Use   Vaping Use: Never used  Substance Use Topics   Alcohol use: Not Currently   Drug use: Never    Home Medications Prior to Admission medications   Medication Sig Start Date End Date Taking? Authorizing Provider  acetaminophen (TYLENOL) 500 MG tablet Take 1,000 mg by mouth every 6 (six) hours as needed for mild pain.   Yes [provider]  allopurinol (ZYLOPRIM) 300 MG tablet Take 300 mg by mouth daily.   Yes [provider]  apixaban (ELIQUIS) 5 MG TABS tablet Take 5 mg by mouth 2 (two) times daily.   Yes [provider]  aspirin EC 81 MG tablet Take 81 mg by mouth daily. Swallow whole.   Yes [provider]  atorvastatin (LIPITOR) 80 MG tablet Take 1 tablet (80 mg total) by mouth daily. 01/14/21  Yes Lurene ShadowAyiku, Bernard, MD  clopidogrel (PLAVIX) 75 MG tablet Take 75 mg by mouth daily. 01/21/21  Yes [provider]  furosemide (LASIX) 20 MG tablet Take 1 tablet (20 mg total) by mouth daily for 10 days. 02/18/21 02/28/21 Yes Gloris Manchesterixon, Apollo Timothy, MD  losartan (COZAAR) 25 MG tablet Take 1 tablet (25 mg total) by mouth daily. 01/14/21  Yes Lurene ShadowAyiku, Bernard, MD  meloxicam (MOBIC) 15 MG tablet Take 15 mg by mouth daily.   Yes [provider]  metoprolol tartrate (LOPRESSOR) 25 MG tablet Take 1 tablet (25 mg total) by mouth 2 (two) times daily. 01/13/21  Yes Lurene ShadowAyiku, Bernard, MD  pantoprazole (PROTONIX) 20 MG tablet Take 20 mg by mouth daily. 01/21/21  Yes [provider]  traMADol (ULTRAM) 50 MG tablet Take 50 mg by mouth every 6 (six) hours as needed for moderate pain.   Yes [provider]    Allergies    Celebrex [celecoxib],  Mobic [meloxicam], Penicillins, and Relafen [nabumetone]  Review of Systems   Review of Systems  Constitutional:  Positive for appetite change and fatigue. Negative for chills and fever.  HENT:  Negative for ear pain and sore throat.   Eyes:  Negative for pain and visual disturbance.  Respiratory:  Positive for shortness of breath (Exertional). Negative for cough and chest tightness.   Cardiovascular:  Positive for leg swelling and near-syncope. Negative for chest pain and palpitations.  Gastrointestinal:  Negative for abdominal pain, diarrhea and vomiting.  Genitourinary:  Negative for dysuria, flank pain, hematuria and pelvic pain.  Musculoskeletal:  Negative for arthralgias, back pain, joint swelling, myalgias and neck pain.  Skin:  Negative for color change and rash.  Neurological:  Negative for dizziness, seizures,  syncope, weakness, light-headedness and numbness.  Hematological:  Bruises/bleeds easily (On Eliquis and DAPT).  Psychiatric/Behavioral:  Negative for confusion and decreased concentration.   All other systems reviewed and are negative.  Physical Exam Updated Vital Signs BP (!) 152/56   Pulse (!) 58   Temp 98.3 F (36.8 C) (Oral)   Resp 20   Ht  (1.626 m)   Wt 95.3 kg   SpO2 96%   BMI 36.05 kg/m   Physical Exam Vitals and nursing note reviewed.  Constitutional:      General: She is not in acute distress.    Appearance: She is well-developed. She is not ill-appearing, toxic-appearing or diaphoretic.  HENT:     Head: Normocephalic and atraumatic.     Mouth/Throat:     Mouth: Mucous membranes are moist.     Pharynx: Oropharynx is clear.  Eyes:     Conjunctiva/sclera: Conjunctivae normal.  Cardiovascular:     Rate and Rhythm: Normal rate. Rhythm irregular.     Heart sounds: No murmur heard. Pulmonary:     Effort: Pulmonary effort is normal. No tachypnea, accessory muscle usage or respiratory distress.     Breath sounds: Examination of the right-lower  field reveals rales. Examination of the left-lower field reveals rales. Rales present. No decreased breath sounds, wheezing or rhonchi.  Chest:     Chest wall: No tenderness.  Abdominal:     Palpations: Abdomen is soft.     Tenderness: There is no abdominal tenderness.  Musculoskeletal:     Cervical back: Neck supple.     Right lower leg: Edema present.     Left lower leg: Edema present.  Skin:    General: Skin is warm and dry.  Neurological:     General: No focal deficit present.     Mental Status: She is alert and oriented to person, place, and time.     Cranial Nerves: No cranial nerve deficit.     Motor: No weakness.  Psychiatric:        Mood and Affect: Mood normal.        Behavior: Behavior normal.    ED Results / Procedures / Treatments   Labs (all labs ordered are listed, but only abnormal results are displayed) Labs Reviewed  CBC WITH DIFFERENTIAL/PLATELET - Abnormal; Notable for the following components:      Result Value   RBC 3.36 (*)    Hemoglobin 11.4 (*)    HCT 35.8 (*)    MCV 106.5 (*)    RDW 15.9 (*)    All other components within normal limits  BRAIN NATRIURETIC PEPTIDE - Abnormal; Notable for the following components:   B Natriuretic Peptide 275.5 (*)    All other components within normal limits  COMPREHENSIVE METABOLIC PANEL - Abnormal; Notable for the following components:   Glucose, Bld 192 (*)    Creatinine, Ser 1.11 (*)    GFR, Estimated 51 (*)    All other components within normal limits  I-STAT CHEM 8, ED - Abnormal; Notable for the following components:   Glucose, Bld 170 (*)    Hemoglobin 11.6 (*)    HCT 34.0 (*)    All other components within normal limits    EKG EKG Interpretation  Date/Time:  Tuesday February 18 2021 11:36:27 EDT Ventricular Rate:  91 PR Interval:    QRS Duration: 90 QT Interval:  323 QTC Calculation: 398 R Axis:   61 Text Interpretation: Atrial fibrillation Low voltage, precordial leads Anteroseptal infarct,  old  Nonspecific T abnormalities, inferior leads 12 Lead; Mason-Likar Confirmed by Gloris Manchester [phone]) on 02/18/2021 11:49:36 AM  Radiology DG Chest Port 1 View  Result Date: 02/18/2021 CLINICAL DATA:  Shortness of breath and tachycardia EXAM: PORTABLE CHEST 1 VIEW COMPARISON:  Chest x-ray dated February 10, 2006 FINDINGS: Cardiac and mediastinal contours within normal limits. Bilateral interstitial opacities. Small left pleural effusion. No evidence of pneumothorax. IMPRESSION: Mild pulmonary edema and small left pleural effusion. Electronically Signed   By: Allegra Lai MD   On: 02/18/2021 12:15    Procedures Procedures   Medications Ordered in ED Medications  albuterol (VENTOLIN HFA) 108 (90 Base) MCG/ACT inhaler 2 puff (2 puffs Inhalation Given 02/18/21 1344)  furosemide (LASIX) injection 20 mg (20 mg Intravenous Given 02/18/21 1344)  losartan (COZAAR) tablet 25 mg (25 mg Oral Given 02/18/21 1419)  metoprolol tartrate (LOPRESSOR) tablet 25 mg (25 mg Oral Given 02/18/21 1419)    ED Course  I have reviewed the triage vital signs and the nursing notes.  Pertinent labs & imaging results that were available during my care of the patient were reviewed by me and considered in my medical decision making (see chart for details).    MDM Rules/Calculators/A&P                           Patient is a 80 year old female with recent history of MI, s/p single stent placed in late June, who presents for 4 weeks of persistent dyspnea on exertion, fatigue, and intermittent lightheadedness.  Patient's breathing difficulty progressed today to where her grandson became worried about her.  She states that, at home, she had notable wheezing.  She denies any history of chronic lung disease.  On arrival in the ED, vital signs are notable for hypertension.  Patient takes metoprolol and losartan at baseline.  She missed her doses this morning.  Other than this morning, she has been adherent to her medications, including her DAPT.   EKG showed no evidence of ischemia.  On initial assessment, patient is well-appearing.  Breathing is not significantly labored.  On lung auscultation, patient does have crackles in bilateral lung bases.  Lower extremities do not appear markedly swollen, however, patient does state that they are swollen greater than normal.  This is new for her.  Presentation is consistent with fluid overload.  Patient did undergo a echocardiogram following her MI which showed an LVEF of 55 to 60% with mild impaired relaxation of her LV.  She states that she has never been on any diuretic medications in the past.  Laboratory work-up was initiated.  Chest x-ray showed further evidence of fluid overload with pulmonary edema with a small left-sided pleural effusion.  Lab work showed normal electrolytes and creatinine that is slightly lower than baseline at 1.11.  20 mg dose of IV Lasix was given.  Following this dose, patient had approximately 1.5 L of urine output.  Over this time, she reported improved symptoms with her breathing.  Patient stated that her preference would be to be able to go home today.  Given her improvement in breathing, I feel that this is appropriate.  I did reach out to her cardiology team to inform them of her ED course.  They stated that they would schedule her for a follow-up appointment early next week.  They also stated that patient can be started on 20 mg of p.o. Lasix at home.  This was prescribed.  Patient was  encouraged to return the ED if any of her symptoms worsen.  She was discharged in stable condition.  Final Clinical Impression(s) / ED Diagnoses Final diagnoses:  Shortness of breath  Exertional dyspnea    Rx / DC Orders ED Discharge Orders          Ordered    furosemide (LASIX) 20 MG tablet  Daily        02/18/21 1628             Gloris Manchester, MD 02/18/21 2107

## 2021-02-18 NOTE — ED Notes (Signed)
Reviewed discharge papers and rx w patient, assisted pt to get dressed, grandson taking pt home, Rene Kocher brought wheelchair for pt and assisted pt to car.

## 2021-02-18 NOTE — ED Notes (Signed)
EKG obtained and provided to Dr. Durwin Nora.

## 2021-02-18 NOTE — Discharge Instructions (Addendum)
Your cardiology team will contact you to set up close follow-up.  Please return to the ED for any worsening of your symptoms.

## 2021-02-18 NOTE — ED Notes (Signed)
X-ray at bedside

## 2021-02-18 NOTE — ED Triage Notes (Signed)
Patient reports that she had a MI on 01/21/21. Patient has increased SOB with activity. Patient reports that she has near syncopal feelings daily since having MI.

## 2021-02-24 ENCOUNTER — Other Ambulatory Visit
Admission: RE | Admit: 2021-02-24 | Discharge: 2021-02-24 | Disposition: A | Discharge status: Home / Self Care | Payer: Medicare Other | Source: Ambulatory Visit | Attending: Physician Assistant | Admitting: Physician Assistant

## 2021-02-24 DIAGNOSIS — I5032 Chronic diastolic (congestive) heart failure: Secondary | ICD-10-CM | POA: Insufficient documentation

## 2021-02-24 DIAGNOSIS — Z79899 Other long term (current) drug therapy: Secondary | ICD-10-CM | POA: Diagnosis present

## 2021-02-24 LAB — BRAIN NATRIURETIC PEPTIDE: B Natriuretic Peptide: 294.9 pg/mL — ABNORMAL HIGH (ref 0.0–100.0)

## 2021-03-10 ENCOUNTER — Ambulatory Visit
Admission: RE | Admit: 2021-03-10 | Discharge: 2021-03-10 | Disposition: A | Discharge status: Home / Self Care | Payer: Medicare Other | Source: Ambulatory Visit | Attending: Family Medicine | Admitting: Family Medicine

## 2021-03-10 ENCOUNTER — Other Ambulatory Visit: Payer: Self-pay

## 2021-03-10 DIAGNOSIS — Z1231 Encounter for screening mammogram for malignant neoplasm of breast: Secondary | ICD-10-CM | POA: Insufficient documentation

## 2021-07-14 ENCOUNTER — Encounter: Payer: Self-pay | Admitting: Emergency Medicine

## 2021-07-14 ENCOUNTER — Emergency Department: Payer: Medicare Other

## 2021-07-14 ENCOUNTER — Other Ambulatory Visit: Payer: Self-pay

## 2021-07-14 DIAGNOSIS — S0990XA Unspecified injury of head, initial encounter: Secondary | ICD-10-CM | POA: Insufficient documentation

## 2021-07-14 DIAGNOSIS — Z7982 Long term (current) use of aspirin: Secondary | ICD-10-CM | POA: Diagnosis not present

## 2021-07-14 DIAGNOSIS — I10 Essential (primary) hypertension: Secondary | ICD-10-CM | POA: Insufficient documentation

## 2021-07-14 DIAGNOSIS — Z7901 Long term (current) use of anticoagulants: Secondary | ICD-10-CM | POA: Insufficient documentation

## 2021-07-14 DIAGNOSIS — E119 Type 2 diabetes mellitus without complications: Secondary | ICD-10-CM | POA: Diagnosis not present

## 2021-07-14 DIAGNOSIS — Z87891 Personal history of nicotine dependence: Secondary | ICD-10-CM | POA: Insufficient documentation

## 2021-07-14 DIAGNOSIS — Z951 Presence of aortocoronary bypass graft: Secondary | ICD-10-CM | POA: Diagnosis not present

## 2021-07-14 DIAGNOSIS — X58XXXA Exposure to other specified factors, initial encounter: Secondary | ICD-10-CM | POA: Insufficient documentation

## 2021-07-14 DIAGNOSIS — Z7902 Long term (current) use of antithrombotics/antiplatelets: Secondary | ICD-10-CM | POA: Insufficient documentation

## 2021-07-14 DIAGNOSIS — Z79899 Other long term (current) drug therapy: Secondary | ICD-10-CM | POA: Diagnosis not present

## 2021-07-14 DIAGNOSIS — H1132 Conjunctival hemorrhage, left eye: Secondary | ICD-10-CM | POA: Diagnosis not present

## 2021-07-14 DIAGNOSIS — I4891 Unspecified atrial fibrillation: Secondary | ICD-10-CM | POA: Insufficient documentation

## 2021-07-14 LAB — PROTIME-INR
INR: 1.4 — ABNORMAL HIGH (ref 0.8–1.2)
Prothrombin Time: 17.5 seconds — ABNORMAL HIGH (ref 11.4–15.2)

## 2021-07-14 LAB — COMPREHENSIVE METABOLIC PANEL
ALT: 17 U/L (ref 0–44)
AST: 19 U/L (ref 15–41)
Albumin: 3.7 g/dL (ref 3.5–5.0)
Alkaline Phosphatase: 107 U/L (ref 38–126)
Anion gap: 8 (ref 5–15)
BUN: 21 mg/dL (ref 8–23)
CO2: 25 mmol/L (ref 22–32)
Calcium: 9.2 mg/dL (ref 8.9–10.3)
Chloride: 105 mmol/L (ref 98–111)
Creatinine, Ser: 1.31 mg/dL — ABNORMAL HIGH (ref 0.44–1.00)
GFR, Estimated: 42 mL/min — ABNORMAL LOW (ref >60)
Glucose, Bld: 172 mg/dL — ABNORMAL HIGH (ref 70–99)
Potassium: 3.4 mmol/L — ABNORMAL LOW (ref 3.5–5.1)
Sodium: 138 mmol/L (ref 135–145)
Total Bilirubin: 0.8 mg/dL (ref 0.3–1.2)
Total Protein: 7.1 g/dL (ref 6.5–8.1)

## 2021-07-14 LAB — CBC WITH DIFFERENTIAL/PLATELET
Abs Immature Granulocytes: 0.01 10*3/uL (ref 0.00–0.07)
Basophils Absolute: 0 10*3/uL (ref 0.0–0.1)
Basophils Relative: 1 %
Eosinophils Absolute: 0.2 10*3/uL (ref 0.0–0.5)
Eosinophils Relative: 4 %
HCT: 33.5 % — ABNORMAL LOW (ref 36.0–46.0)
Hemoglobin: 11 g/dL — ABNORMAL LOW (ref 12.0–15.0)
Immature Granulocytes: 0 %
Lymphocytes Relative: 17 %
Lymphs Abs: 1.1 10*3/uL (ref 0.7–4.0)
MCH: 34.6 pg — ABNORMAL HIGH (ref 26.0–34.0)
MCHC: 32.8 g/dL (ref 30.0–36.0)
MCV: 105.3 fL — ABNORMAL HIGH (ref 80.0–100.0)
Monocytes Absolute: 0.2 10*3/uL (ref 0.1–1.0)
Monocytes Relative: 4 %
Neutro Abs: 4.9 10*3/uL (ref 1.7–7.7)
Neutrophils Relative %: 74 %
Platelets: 236 10*3/uL (ref 150–400)
RBC: 3.18 MIL/uL — ABNORMAL LOW (ref 3.87–5.11)
RDW: 15.5 % (ref 11.5–15.5)
WBC: 6.6 10*3/uL (ref 4.0–10.5)
nRBC: 0 % (ref 0.0–0.2)

## 2021-07-14 NOTE — ED Provider Notes (Signed)
I was asked to evaluate pt in waiting room. On exam, pt has severe, bullous, circumferential subconjunctival hemorrhage but the globe itself appears intact, visual acuity is not affected, and there appears to be no hyphema at this time. EOM intact. PERRL without signs of asymmetry. Discussed with Dr. Brooke Dare via telephone, who recommends reassurance in addition to eye lubrication in the event that pt cannot fully close her eye due to the swelling. Beyond that, this is unlikely to cause any additional issues as long as the globe is intact. Will order CT and basic labs, as well as recommend staining/evaluation for globe rupture in ED. Pt updated and in agreement.   Shaune Pollack, MD 07/14/21 [phone]

## 2021-07-14 NOTE — ED Triage Notes (Signed)
Pt to ED via POV with c/o left eye trauma. Pt states that her hair was in her eye and she took her hand to get it out of it and cause her eye bruised and eye has what appears to be a bubble of blood in it. She does take blood thinner.

## 2021-07-15 ENCOUNTER — Emergency Department
Admission: EM | Admit: 2021-07-15 | Discharge: 2021-07-15 | Disposition: A | Discharge status: Home / Self Care | Payer: Medicare Other | Attending: Emergency Medicine | Admitting: Emergency Medicine

## 2021-07-15 DIAGNOSIS — H1132 Conjunctival hemorrhage, left eye: Secondary | ICD-10-CM

## 2021-07-15 NOTE — ED Provider Notes (Signed)
[facility] Emergency Department Provider Note  ____________________________________________  Time seen: Approximately 3:59 AM  I have reviewed the triage vital signs and the nursing notes.   HISTORY  Chief Complaint Eye Injury   HPI Brooke Davis is a 80 y.o. female with a history of A. fib on Eliquis who presents for left eye injury.  Patient reports that she felt that her hair was inside her left eye.  She used her finger to remove the hair and accidentally touched the inside of her eye.  She then noticed the eye started to get bruise and swollen which made her concerned.  No changes in vision or blurry vision, no headache.  Past Medical History:  Diagnosis Date   Arthritis    fingers, right hip   Atrial fibrillation (HCC)    Noted at [facility] 11/10/13 before knee surgery; thought to be due to Relafen; Afib stopped after Relafen was discontinued.   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  All  OK.   CATARACT EXTRACTION W/PHACO Left 03/27/2020   Procedure: CATARACT EXTRACTION PHACO AND INTRAOCULAR LENS PLACEMENT (IOC) LEFT 7.49  00:56.4  13.3%;  Surgeon: Lockie Mola, MD;  Location: [facility];  Service: Ophthalmology;  Laterality: Left;   CATARACT EXTRACTION W/PHACO Right 04/24/2020   Procedure: CATARACT EXTRACTION PHACO AND INTRAOCULAR LENS PLACEMENT (IOC) RIGHT;  Surgeon: Lockie Mola, MD;  Location: [facility];  Service: Ophthalmology;  Laterality: Right;  8.34 0:50.2 16.6%   CORONARY/GRAFT ACUTE MI REVASCULARIZATION N/A 01/11/2021   Procedure: Coronary/Graft Acute MI Revascularization;  Surgeon: Marcina Millard, MD;  Location: [facility] INVASIVE CV LAB;  Service: Cardiovascular;  Laterality: N/A;   LEFT HEART CATH AND CORONARY ANGIOGRAPHY N/A 01/11/2021   Procedure: LEFT HEART CATH AND CORONARY ANGIOGRAPHY;  Surgeon: Marcina Millard, MD;  Location: [facility] INVASIVE CV LAB;  Service: Cardiovascular;  Laterality: N/A;   PARTIAL HYSTERECTOMY      Prior to Admission medications   Medication Sig Start Date End Date Taking? Authorizing Provider  acetaminophen (TYLENOL) 500 MG tablet Take 1,000 mg by mouth every 6 (six) hours as needed for mild pain.    [provider]  allopurinol (ZYLOPRIM) 300 MG tablet Take 300 mg by mouth daily.    [provider]  apixaban (ELIQUIS) 5 MG TABS tablet Take 5 mg by mouth 2 (two) times daily.    [provider]  aspirin EC 81 MG tablet Take 81 mg by mouth daily. Swallow whole.  [provider]  atorvastatin (LIPITOR) 80 MG tablet Take 1 tablet (80 mg total) by mouth daily. 01/14/21   Lurene Shadow, MD  clopidogrel (PLAVIX) 75 MG tablet Take 75 mg by mouth daily. 01/21/21   [provider]  furosemide (LASIX) 20 MG tablet Take 1 tablet (20 mg total) by mouth daily for 10 days. 02/18/21 02/28/21  Gloris [place], MD  losartan (COZAAR) 25 MG tablet Take 1 tablet (25 mg total) by mouth  daily. 01/14/21   Lurene Shadow, MD  meloxicam (MOBIC) 15 MG tablet Take 15 mg by mouth daily.    [provider]  metoprolol tartrate (LOPRESSOR) 25 MG tablet Take 1 tablet (25 mg total) by mouth 2 (two) times daily. 01/13/21   Lurene Shadow, MD  pantoprazole (PROTONIX) 20 MG tablet Take 20 mg by mouth daily. 01/21/21   [provider]  traMADol (ULTRAM) 50 MG tablet Take 50 mg by mouth every 6 (six) hours as needed for moderate pain.    [provider]    Allergies Celebrex [celecoxib], Mobic [meloxicam], Penicillins, and Relafen [nabumetone]  Family History  Problem Relation Age of Onset   Heart attack Mother 9       required open heart surgery. Onset age is unknown.   Stroke Father    Diabetes Father    Alcoholism Brother    Breast cancer Neg Hx     Social History Social History   Tobacco Use   Smoking status: Former    Types: Cigarettes    Quit date: 1980    Years since quitting: 43.0   Smokeless tobacco: Never  Vaping Use   Vaping Use: Never used  Substance Use Topics   Alcohol use: Not Currently   Drug use: Never    Review of Systems  Constitutional: Negative for fever. Eyes: Negative for visual changes. + eye swelling and bruising ENT: Negative for sore throat. Neck: No neck pain  Cardiovascular: Negative for chest pain. Respiratory: Negative for shortness of breath. Gastrointestinal: Negative for abdominal pain, vomiting or diarrhea. Genitourinary: Negative for dysuria. Musculoskeletal: Negative for back pain. Skin: Negative for rash. Neurological: Negative for headaches, weakness or numbness. Psych: No SI or HI  ____________________________________________   PHYSICAL EXAM:  VITAL SIGNS: ED Triage Vitals  Enc Vitals Group     BP 07/15/21 0259 (!) 168/85     Pulse Rate 07/14/21 1911 86     Resp 07/14/21 1911 20     Temp 07/14/21 1911 98.8 F (37.1 C)     Temp Source 07/14/21 1911 Oral     SpO2 07/14/21 1911 97 %      Weight 07/14/21 1911 197 lb (89.4 kg)     Height 07/14/21 1911 5\' 4"  (1.626 m)     Head Circumference --      Peak Flow --      Pain Score 07/14/21 1911 6     Pain Loc --      Pain Edu? --      Excl. in GC? --     Constitutional: Alert and oriented. Well appearing and in no apparent distress. HEENT:      Head: Normocephalic and atraumatic.         Eyes: Large some conjunctival hemorrhage involving the entire conjunctiva on the left, pupil is round and symmetric, reactive to light, normal visual acuity, no active bleeding.  There is small amount of bruising to the left cheek right under the eye as well.  Patient is able to  fully close her eye.  Extraocular movements are intact and painless.      Mouth/Throat: Mucous membranes are moist.       Neck: Supple with no signs of meningismus. Cardiovascular: Regular rate and rhythm.  Respiratory: Normal respiratory effort.  Musculoskeletal:  No edema, cyanosis, or erythema of extremities. Neurologic: Normal speech and language. Face is symmetric. Moving all extremities. No gross focal neurologic deficits are appreciated. Skin: Skin is warm, dry and intact. No rash noted. Psychiatric: Mood and affect are normal. Speech and behavior are normal.  ____________________________________________   LABS (all labs ordered are listed, but only abnormal results are displayed)  Labs Reviewed  CBC WITH DIFFERENTIAL/PLATELET - Abnormal; Notable for the following components:      Result Value   RBC 3.18 (*)    Hemoglobin 11.0 (*)    HCT 33.5 (*)    MCV 105.3 (*)    MCH 34.6 (*)    All other components within normal limits  COMPREHENSIVE METABOLIC PANEL - Abnormal; Notable for the following components:   Potassium 3.4 (*)    Glucose, Bld 172 (*)    Creatinine, Ser 1.31 (*)    GFR, Estimated 42 (*)    All other components within normal limits  PROTIME-INR - Abnormal; Notable for the following components:   Prothrombin Time 17.5 (*)    INR 1.4 (*)     All other components within normal limits   ____________________________________________  EKG  none  ____________________________________________  RADIOLOGY  I have personally reviewed the images performed during this visit and I agree with the Radiologist's read.   Interpretation by Radiologist:  CT Head Wo Contrast  Result Date: 07/14/2021 CLINICAL DATA:  Recent facial trauma, initial encounter EXAM: CT HEAD WITHOUT CONTRAST CT MAXILLOFACIAL WITHOUT CONTRAST TECHNIQUE: Multidetector CT imaging of the head and maxillofacial structures were performed using the standard protocol without intravenous contrast. Multiplanar CT image reconstructions of the maxillofacial structures were also generated. COMPARISON:  None. FINDINGS: CT HEAD FINDINGS Brain: Mild atrophic changes and chronic white matter ischemic changes are seen. No findings to suggest acute hemorrhage, acute infarction or space-occupying mass lesion are seen. Vascular: No hyperdense vessel or unexpected calcification. Skull: Normal. Negative for fracture or focal lesion. Other: None CT MAXILLOFACIAL FINDINGS Osseous: No fracture or mandibular dislocation. No destructive process. Orbits: Orbits and their contents are within normal limits. Sinuses: Paranasal sinuses are well aerated without focal abnormality. Soft tissues: Negative. IMPRESSION: CT of the head: Chronic atrophic and ischemic changes without acute abnormality. CT of the maxillofacial bones: No acute abnormality is seen. Electronically Signed   By: Alcide Clever M.D.   On: 07/14/2021 20:08   CT MAXILLOFACIAL WO CONTRAST  Result Date: 07/14/2021 CLINICAL DATA:  Recent facial trauma, initial encounter EXAM: CT HEAD WITHOUT CONTRAST CT MAXILLOFACIAL WITHOUT CONTRAST TECHNIQUE: Multidetector CT imaging of the head and maxillofacial structures were performed using the standard protocol without intravenous contrast. Multiplanar CT image reconstructions of the maxillofacial  structures were also generated. COMPARISON:  None. FINDINGS: CT HEAD FINDINGS Brain: Mild atrophic changes and chronic white matter ischemic changes are seen. No findings to suggest acute hemorrhage, acute infarction or space-occupying mass lesion are seen. Vascular: No hyperdense vessel or unexpected calcification. Skull: Normal. Negative for fracture or focal lesion. Other: None CT MAXILLOFACIAL FINDINGS Osseous: No fracture or mandibular dislocation. No destructive process. Orbits: Orbits and their contents are within normal limits. Sinuses: Paranasal sinuses are well aerated without focal abnormality. Soft tissues: Negative. IMPRESSION: CT of the  head: Chronic atrophic and ischemic changes without acute abnormality. CT of the maxillofacial bones: No acute abnormality is seen. Electronically Signed   By: Alcide Clever M.D.   On: 07/14/2021 20:08     ____________________________________________   PROCEDURES  Procedure(s) performed: None Procedures   Critical Care performed:  None ____________________________________________   INITIAL IMPRESSION / ASSESSMENT AND PLAN / ED COURSE  80 y.o. female with a history of A. fib on Eliquis who presents for left eye injury.  Patient presents with a large subconjunctival hemorrhage of the left eye after accidentally hitting her finger in her eye.  She was seen by physician in triage Dr. Erma Heritage as soon as she arrived at 7:30 PM last night.  At that time a discussion was done with Dr. Brooke Dare from ophthalmology who recommended reassurance and outpatient follow-up with no immediate interventions.  He did recommend imaging studies which were done showing an intact globe.  Patient has round reactive pupil with painless extraocular movements, no proptosis, no drainage from the eye, no active bleeding, no changes in visual acuity.  She is fully able to close her eye.  Recommended sleeping with the head of the bed slightly elevated and close follow-up with ophthalmology.   Did discussed my standard return precautions for pain with extraocular movements, active bleeding, or changes in vision or visual loss.      _____________________________________________ Please note:  Patient was evaluated in Emergency Department today for the symptoms described in the history of present illness. Patient was evaluated in the context of the global COVID-19 pandemic, which necessitated consideration that the patient might be at risk for infection with the SARS-CoV-2 virus that causes COVID-19. Institutional protocols and algorithms that pertain to the evaluation of patients at risk for COVID-19 are in a state of rapid change based on information released by regulatory bodies including the CDC and federal and state organizations. These policies and algorithms were followed during the patient's care in the ED.  Some ED evaluations and interventions may be delayed as a result of limited staffing during the pandemic.   [place] Controlled Substance Database was reviewed by me. ____________________________________________   FINAL CLINICAL IMPRESSION(S) / ED DIAGNOSES   Final diagnoses:  Subconjunctival hemorrhage of left eye      NEW MEDICATIONS STARTED DURING THIS VISIT:  ED Discharge Orders     None        Note:  This document was prepared using Dragon voice recognition software and may include unintentional dictation errors.    Nita Sickle, MD 07/15/21 [phone]

## 2021-07-15 NOTE — Discharge Instructions (Signed)
Return to the ER for severe eye pain, changes in vision or vision loss, severe headache.  Otherwise make sure to follow-up with ophthalmology in 24 to 48 hours.

## 2021-07-15 NOTE — ED Notes (Signed)
Patient with bruising to left eye. Left conjunctiva red and swollen.

## 2021-09-23 DIAGNOSIS — R4781 Slurred speech: Secondary | ICD-10-CM | POA: Insufficient documentation

## 2021-10-03 ENCOUNTER — Other Ambulatory Visit: Payer: Self-pay

## 2021-10-03 ENCOUNTER — Emergency Department
Admission: EM | Admit: 2021-10-03 | Discharge: 2021-10-03 | Disposition: A | Discharge status: Home / Self Care | Payer: Medicare Other | Attending: Emergency Medicine | Admitting: Emergency Medicine

## 2021-10-03 ENCOUNTER — Emergency Department: Payer: Medicare Other

## 2021-10-03 DIAGNOSIS — E119 Type 2 diabetes mellitus without complications: Secondary | ICD-10-CM | POA: Diagnosis not present

## 2021-10-03 DIAGNOSIS — I4891 Unspecified atrial fibrillation: Secondary | ICD-10-CM | POA: Insufficient documentation

## 2021-10-03 DIAGNOSIS — I10 Essential (primary) hypertension: Secondary | ICD-10-CM | POA: Insufficient documentation

## 2021-10-03 DIAGNOSIS — R471 Dysarthria and anarthria: Secondary | ICD-10-CM | POA: Diagnosis present

## 2021-10-03 DIAGNOSIS — R531 Weakness: Secondary | ICD-10-CM | POA: Insufficient documentation

## 2021-10-03 LAB — COMPREHENSIVE METABOLIC PANEL
ALT: 12 U/L (ref 0–44)
AST: 20 U/L (ref 15–41)
Albumin: 4.1 g/dL (ref 3.5–5.0)
Alkaline Phosphatase: 97 U/L (ref 38–126)
Anion gap: 10 (ref 5–15)
BUN: 19 mg/dL (ref 8–23)
CO2: 27 mmol/L (ref 22–32)
Calcium: 9.6 mg/dL (ref 8.9–10.3)
Chloride: 102 mmol/L (ref 98–111)
Creatinine, Ser: 1.2 mg/dL — ABNORMAL HIGH (ref 0.44–1.00)
GFR, Estimated: 46 mL/min — ABNORMAL LOW (ref >60)
Glucose, Bld: 138 mg/dL — ABNORMAL HIGH (ref 70–99)
Potassium: 3.5 mmol/L (ref 3.5–5.1)
Sodium: 139 mmol/L (ref 135–145)
Total Bilirubin: 1 mg/dL (ref 0.3–1.2)
Total Protein: 8 g/dL (ref 6.5–8.1)

## 2021-10-03 LAB — CBC
HCT: 35.2 % — ABNORMAL LOW (ref 36.0–46.0)
Hemoglobin: 11.3 g/dL — ABNORMAL LOW (ref 12.0–15.0)
MCH: 34.1 pg — ABNORMAL HIGH (ref 26.0–34.0)
MCHC: 32.1 g/dL (ref 30.0–36.0)
MCV: 106.3 fL — ABNORMAL HIGH (ref 80.0–100.0)
Platelets: 238 10*3/uL (ref 150–400)
RBC: 3.31 MIL/uL — ABNORMAL LOW (ref 3.87–5.11)
RDW: 14.4 % (ref 11.5–15.5)
WBC: 6.7 10*3/uL (ref 4.0–10.5)
nRBC: 0 % (ref 0.0–0.2)

## 2021-10-03 MED ORDER — LABETALOL HCL 5 MG/ML IV SOLN
10.0000 mg | Freq: Once | INTRAVENOUS | Status: AC
  Administered 2021-10-03: 10 mg via INTRAVENOUS
  Filled 2021-10-03: qty 4

## 2021-10-03 MED ORDER — LOSARTAN POTASSIUM 25 MG PO TABS: 50.0000 mg | ORAL_TABLET | Freq: Every day | ORAL | 0 refills | Status: DC

## 2021-10-03 NOTE — ED Triage Notes (Signed)
Pt here from [facility] with hypertension. Pt bp today was 208/110 at [facility]. Pt takes bp medication, took at 1000 this morning. Pt has also been having slurred speech for the past months but has become more frequent.  Pt denies weakness. Pt states only pain is her left chronic hip pain. Pt stable on arrival to ED. ?

## 2021-10-03 NOTE — ED Provider Notes (Addendum)
? ?[facility] ?Provider Note ? ? ? Event Date/Time  ? First MD Initiated Contact with Patient 10/03/21 [phone]   ?  (approximate) ? ? ?History  ? ?Hypertension ? ? ?HPI ? ?Brooke Davis is a 81 y.o. female with a history of atrial fibrillation, hypertension, diabetes who presents with elevated blood pressure.  Reportedly blood pressure has been elevated for over a month she had been well controlled on blood pressure medications before that.  Family also reports that she had an episode of right-sided weakness as well as difficulty speaking which have only lasted about 5 minutes.  This is not occurred in the last week reportedly.  Had neurology appointment today was referred to the ED for evaluation given elevated blood pressure.  Patient feels her blood pressure was elevated because of stress related to husband's cancer diagnosis ?  ? ? ?Physical Exam  ? ?Triage Vital Signs: ?ED Triage Vitals  ?Enc Vitals Group  ?   BP 10/03/21 0926 (!) 194/112  ?   Pulse Rate 10/03/21 0926 73  ?   Resp 10/03/21 0926 16  ?   Temp 10/03/21 0930 98.2 ?F (36.8 ?C)  ?   Temp Source 10/03/21 0930 Oral  ?   SpO2 10/03/21 0926 99 %  ?   Weight 10/03/21 0925 89.4 kg (197 lb 1.5 oz)  ?   Height 10/03/21 0925 1.626 m (5\' 4" )  ?   Head Circumference --   ?   Peak Flow --   ?   Pain Score 10/03/21 0924 7  ?   Pain Loc --   ?   Pain Edu? --   ?   Excl. in GC? --   ? ? ?Most recent vital signs: ?Vitals:  ? 10/03/21 0930 10/03/21 0934  ?BP:  (!) 173/109  ?Pulse:  65  ?Resp:  15  ?Temp: 98.2 ?F (36.8 ?C)   ?SpO2:  96%  ? ? ? ?General: Awake, no distress.  ?CV:  Good peripheral perfusion.  ?Resp:  Normal effort.  ?Abd:  No distention.  ?Other:  Cranial nerves II to XII are normal, normal neuro exam at this time ? ? ?ED Results / Procedures / Treatments  ? ?Labs ?(all labs ordered are listed, but only abnormal results are displayed) ?Labs Reviewed  ?CBC - Abnormal; Notable for the following components:  ?    Result Value  ? RBC  3.31 (*)   ? Hemoglobin 11.3 (*)   ? HCT 35.2 (*)   ? MCV 106.3 (*)   ? MCH 34.1 (*)   ? All other components within normal limits  ?COMPREHENSIVE METABOLIC PANEL - Abnormal; Notable for the following components:  ? Glucose, Bld 138 (*)   ? Creatinine, Ser 1.20 (*)   ? GFR, Estimated 46 (*)   ? All other components within normal limits  ? ? ? ?EKG ? ?ED ECG REPORT ?I, 10/05/21, the attending physician, personally viewed and interpreted this ECG. ? ?Date: 10/03/2021 ? ?Rhythm: Atrial fibrillation ?QRS Axis: normal ?Intervals: Abnormal ?ST/T Wave abnormalities: normal ?Narrative Interpretation: no evidence of acute ischemia ? ? ? ?RADIOLOGY ?CT head viewed interpret by me, no acute abnormality ? ? ? ?PROCEDURES: ? ?Critical Care performed:  ? ?Procedures ? ? ?MEDICATIONS ORDERED IN ED: ?Medications  ?labetalol (NORMODYNE) injection 10 mg (10 mg Intravenous Given 10/03/21 1015)  ? ? ? ?IMPRESSION / MDM / ASSESSMENT AND PLAN / ED COURSE  ?I reviewed the triage vital signs and  the nursing notes. ? ? ? ?Patient presents with elevated blood pressure, concerning neuro symptoms intermittently over the last several weeks.  She had an episode of right hand weakness, she had an episode of difficulty speaking.  No neurodeficits in the last several weeks.  She reports in the past she was well controlled on her blood pressure medication. ? ?Will obtain labs, give IV labetalol, obtain CT head ?----------------------------------------- ?11:20 AM on 10/03/2021 ?----------------------------------------- ? ?Lab work is reassuring, CT head is unremarkable.  Patient's blood pressure has mildly improved.  She remains asymptomatic.  No neurodeficits.  We discussed MRI however she does not want to do one because she is very claustrophobic. ? ?We discussed admission however she wants to be home because her husband is dealing with a recent diagnosis of cancer.  She feels this is likely why her blood pressure is elevated.  She has a  follow-up with her PCP in 3 days to recheck her blood pressure.  In the meantime we will double her losartan, she knows to call EMS if any neurodeficits and return to the ED immediately ? ? ? ?  ? ? ?FINAL CLINICAL IMPRESSION(S) / ED DIAGNOSES  ? ?Final diagnoses:  ?Primary hypertension  ? ? ? ?Rx / DC Orders  ? ?ED Discharge Orders   ? ?      Ordered  ?  losartan (COZAAR) 25 MG tablet  Daily       ? 10/03/21 1119  ? ?  ?  ? ?  ? ? ? ?Note:  This document was prepared using Dragon voice recognition software and may include unintentional dictation errors. ?  ?Jene Every, MD ?10/03/21 1121 ? ?  ?Jene Every, MD ?10/03/21 1123 ? ?

## 2021-10-03 NOTE — Discharge Instructions (Addendum)
I have doubled your losartan.  Please take 50 mg a day and track your blood pressure for Dr. Burnett Sheng ?

## 2021-10-03 NOTE — ED Notes (Signed)
Patient arrived with high blood pressure. Patient went to [facility] this AM to see neurology about her intermittent slurred speech and was sent here for hypertension ?

## 2022-02-09 IMAGING — MG DIGITAL SCREENING BILAT W/ TOMO W/ CAD
8 series · 8 of 24 positions shown · non-contrast
Comparison: Previous exam(s).

CLINICAL DATA: Screening.

EXAM:
DIGITAL SCREENING BILATERAL MAMMOGRAM WITH TOMO AND CAD

[R MLO synth-2D]
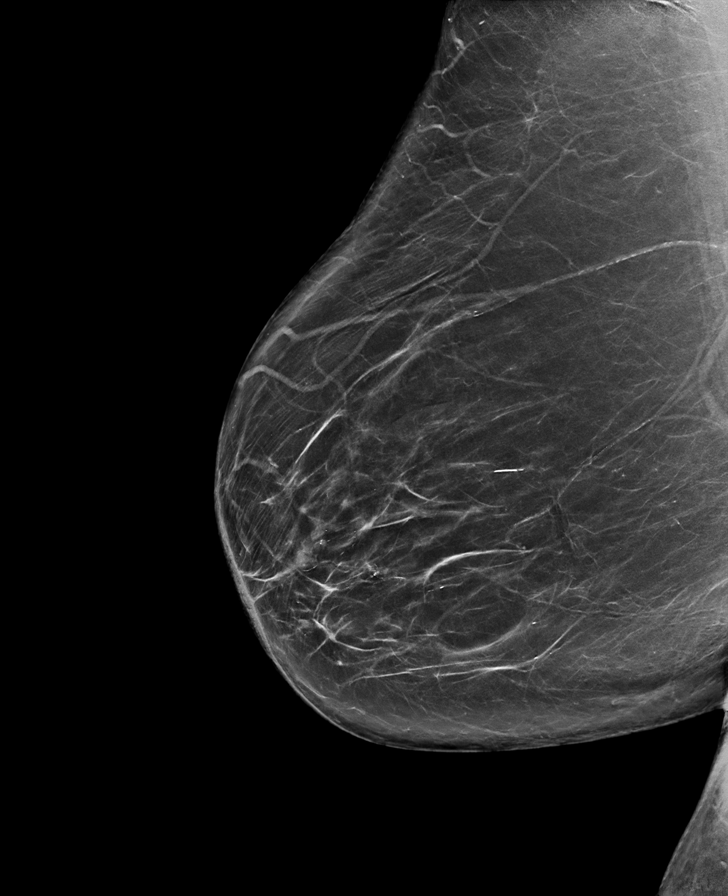

[L CC synth-2D]
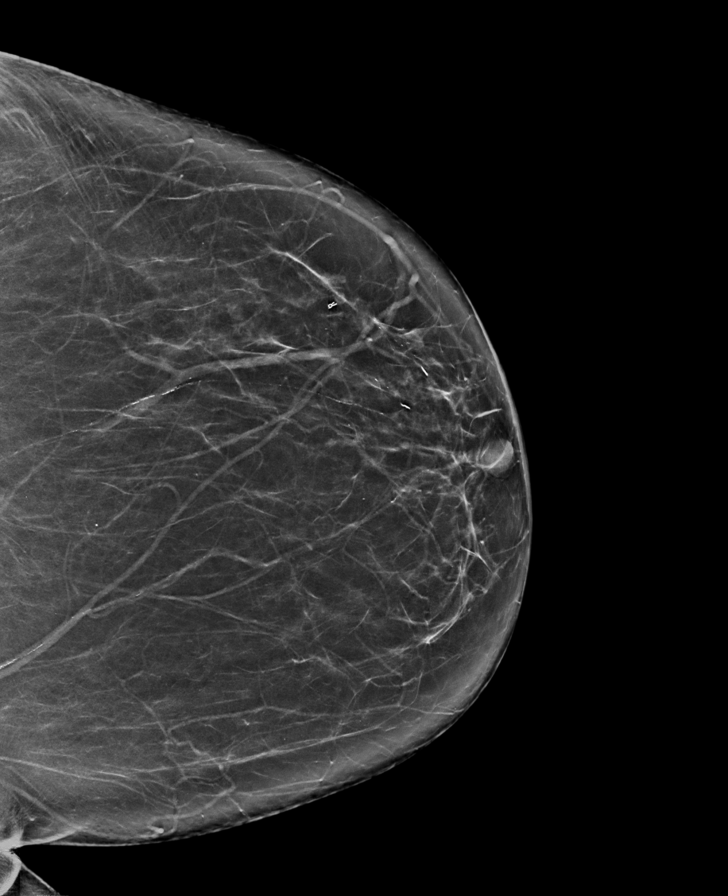

[L MLO synth-2D]
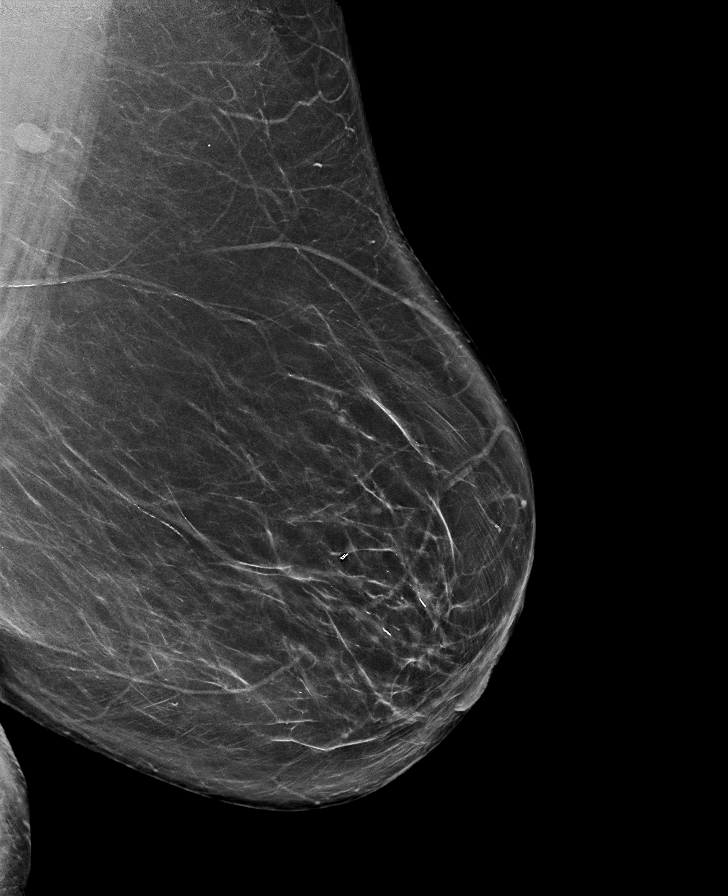

[R CC synth-2D]
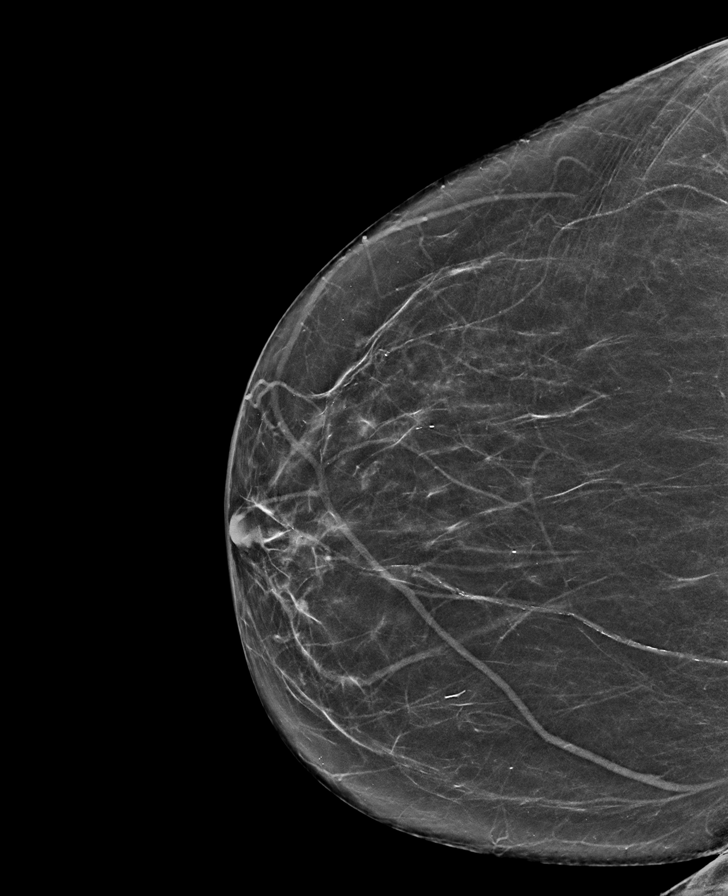

[R CC tomo · tomo slice 33/65.0]
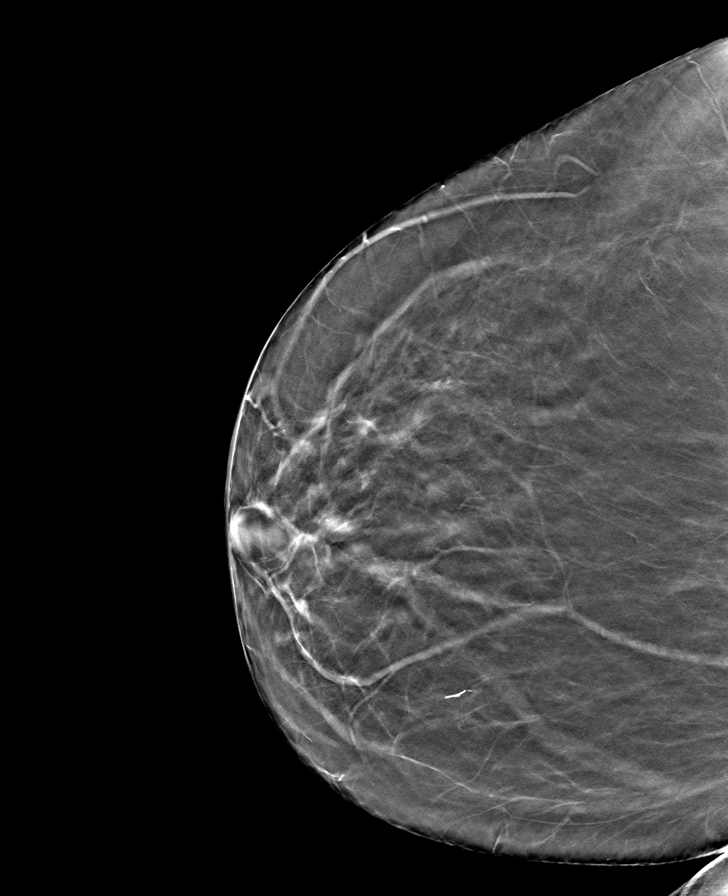

[L MLO tomo · tomo slice 45/88.0]
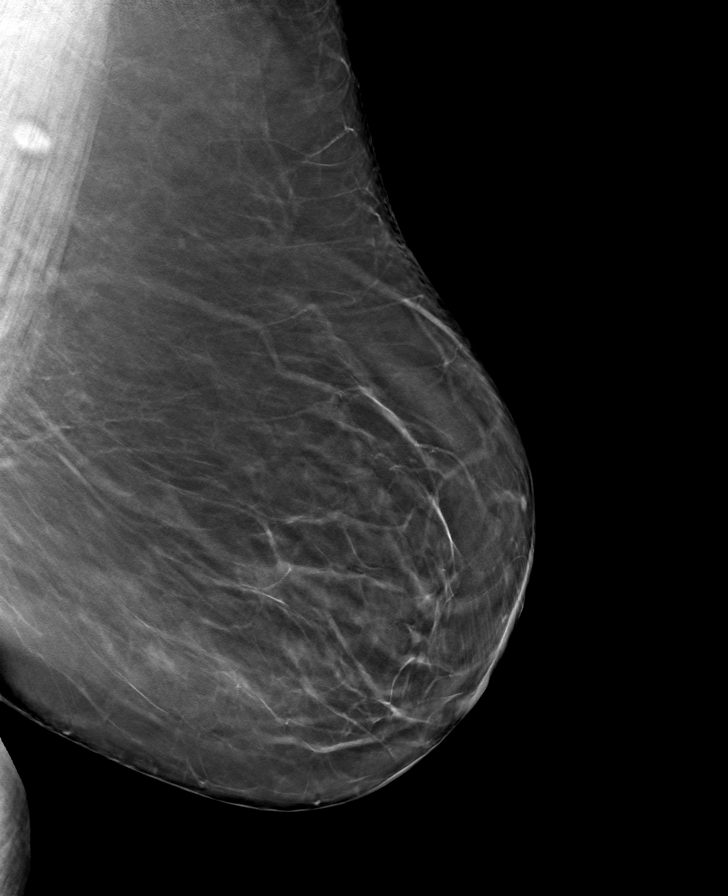

[R MLO tomo · tomo slice 43/86.0]
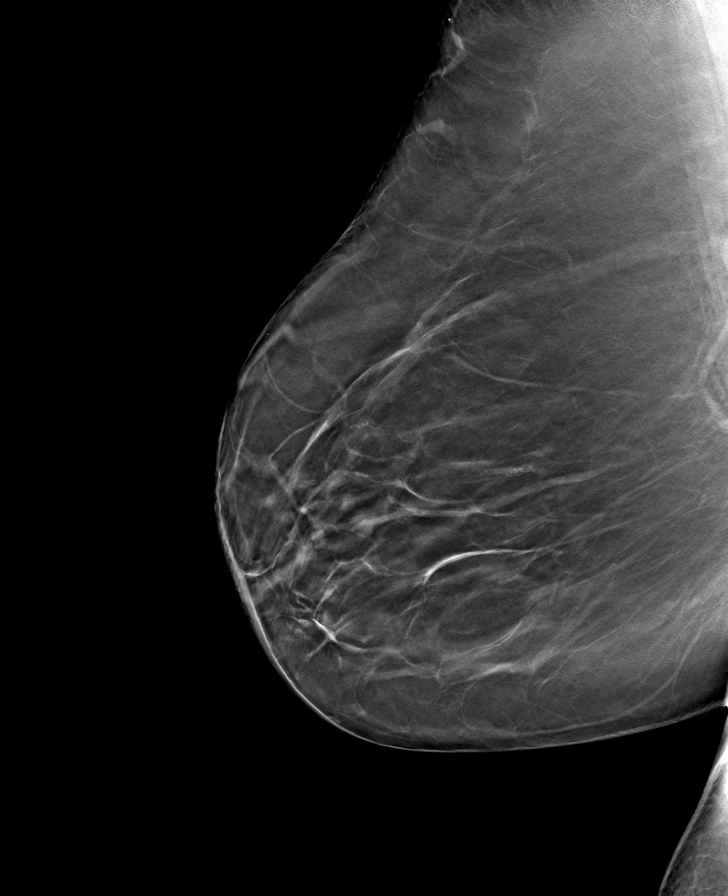

[L CC tomo · tomo slice 38/75.0]
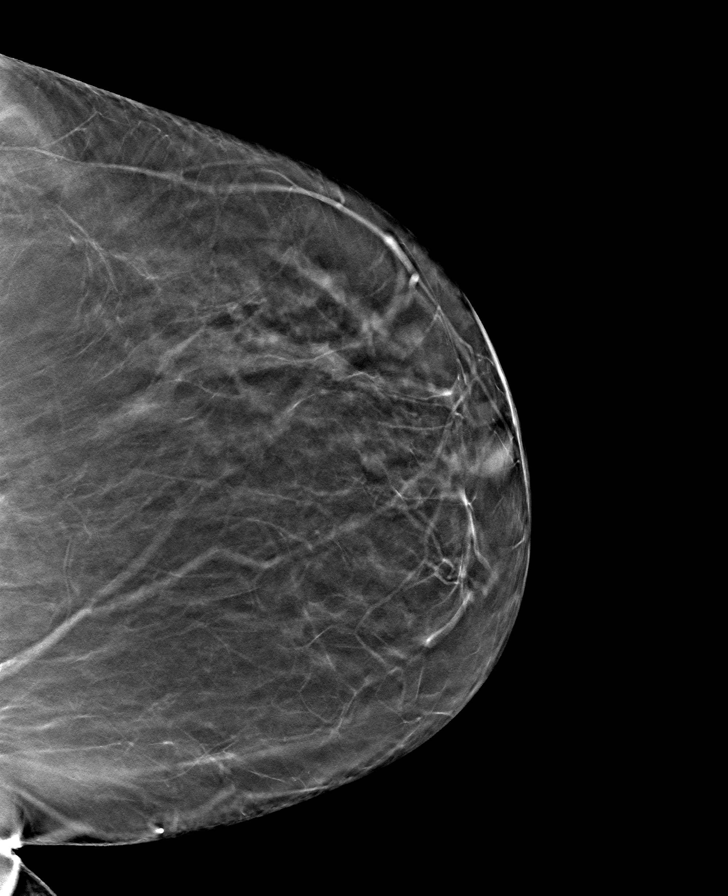

[8 of 24 positions shown; findings below may reference images not displayed]

ACR Breast Density Category b: There are scattered areas of
fibroglandular density.
FINDINGS: There are no findings suspicious for malignancy. Images were
processed with CAD.
IMPRESSION: No mammographic evidence of malignancy. A result letter of this
screening mammogram will be mailed directly to the patient.

RECOMMENDATION:
Screening mammogram in one year. (Code:[id number])

BI-RADS CATEGORY  1: Negative.

## 2022-06-25 ENCOUNTER — Other Ambulatory Visit
Admission: RE | Admit: 2022-06-25 | Discharge: 2022-06-25 | Disposition: A | Discharge status: Home / Self Care | Payer: Medicare Other | Source: Ambulatory Visit | Attending: Physician Assistant | Admitting: Physician Assistant

## 2022-06-25 DIAGNOSIS — R600 Localized edema: Secondary | ICD-10-CM | POA: Insufficient documentation

## 2022-06-25 DIAGNOSIS — R0602 Shortness of breath: Secondary | ICD-10-CM | POA: Insufficient documentation

## 2022-06-25 LAB — BRAIN NATRIURETIC PEPTIDE: B Natriuretic Peptide: 433.9 pg/mL — ABNORMAL HIGH (ref 0.0–100.0)

## 2022-09-07 ENCOUNTER — Other Ambulatory Visit: Payer: Self-pay | Admitting: Family Medicine

## 2022-09-07 DIAGNOSIS — Z1231 Encounter for screening mammogram for malignant neoplasm of breast: Secondary | ICD-10-CM

## 2022-09-14 ENCOUNTER — Ambulatory Visit
Admission: RE | Admit: 2022-09-14 | Discharge: 2022-09-14 | Disposition: A | Discharge status: Home / Self Care | Payer: Medicare Other | Source: Ambulatory Visit | Attending: Family Medicine | Admitting: Family Medicine

## 2022-09-14 DIAGNOSIS — Z1231 Encounter for screening mammogram for malignant neoplasm of breast: Secondary | ICD-10-CM | POA: Diagnosis present

## 2022-11-30 DIAGNOSIS — M1611 Unilateral primary osteoarthritis, right hip: Secondary | ICD-10-CM | POA: Insufficient documentation

## 2022-12-27 NOTE — Discharge Instructions (Addendum)
Instructions after Total Hip Replacement   James P. Angie Fava., M.D.    Dept. of Orthopaedics & Sports Medicine [facility] [address] [place], [place]  [address]  Phone: [phone]   Fax: [phone]        [email or web address]        DIET: Drink plenty of non-alcoholic fluids. Resume your normal diet. Include foods high in fiber.  ACTIVITY:  You may use crutches or a walker with weight-bearing as tolerated, unless instructed otherwise. You may be weaned off of the walker or crutches by your Physical Therapist.  Do NOT reach below the level of your knees or cross your legs until allowed.    Continue doing gentle exercises. Exercising will reduce the pain and swelling, increase motion, and prevent muscle weakness.   Please continue to use the TED compression stockings for 6 weeks. You may remove the stockings at night, but should reapply them in the morning. Do not drive or operate any equipment until instructed.  WOUND CARE:  Continue to use ice packs periodically to reduce pain and swelling. Keep the incision clean and dry. You may bathe or shower after the staples are removed at the first office visit following surgery. Aquacel bandage remains in place for 7 days postoperatively.  This can be changed out to a honeycomb bandage that you will be sent home with.  At home PT can help with this.  MEDICATIONS: You may resume your regular medications. Please take the pain medication as prescribed on the medication. Do not take pain medication on an empty stomach. You have been given a prescription for a blood thinner -continue with at home Eliquis prescription Pain medications and iron supplements can cause constipation. Use a stool softener (Senokot or Colace) on a daily basis and a laxative (dulcolax or miralax) as needed. Do not drive or drink alcoholic beverages when taking pain medications.  CALL THE OFFICE FOR: Temperature above 101 degrees Excessive  bleeding or drainage on the dressing. Excessive swelling, coldness, or paleness of the toes. Persistent nausea and vomiting.  FOLLOW-UP:  You should have an appointment to return to the office in 6 weeks after surgery. Arrangements have been made for continuation of Physical Therapy (either home therapy or outpatient therapy).     [facility] Department Directory         [email or web address]       [email or web address]          Cardiology  Appointments: [place] - [phone]  Endocrinology  Appointments: [place] [phone] [place] - [phone]  Gastroenterology  Appointments: [place] [phone] [place] - [phone]        General Surgery   Appointments: [facility]  Internal Medicine/Family Medicine  Appointments: [facility] - [phone] [place] - [phone]  Metabolic and Weigh Loss Surgery  Appointments: [facility]        Neurology  Appointments: [place] [phone] [place] - [phone]  Neurosurgery  Appointments: [place]  Obstetrics & Gynecology  Appointments: [place] [phone] [place] - [phone]        Pediatrics  Appointments: [place] [phone] [place] - [phone]  Physiatry  Appointments: [place] [phone]  Physical Therapy  Appointments: [place] - [phone]        Podiatry  Appointments: [place] [phone] [place] - [phone]  Pulmonology  Appointments: [place]  Rheumatology  Appointments: [place] [phone]        [place] Location: [facility]  [phone]  [address] [place], [place]  [address]  [place] Location: [facility]. [address] [place], [place]  [address]  [place] Location: [facility] [address] [place], [place]  [address]

## 2023-01-05 ENCOUNTER — Encounter
Admission: RE | Admit: 2023-01-05 | Discharge: 2023-01-05 | Disposition: A | Discharge status: Home / Self Care | Payer: Medicare Other | Source: Ambulatory Visit | Attending: Orthopedic Surgery | Admitting: Orthopedic Surgery

## 2023-01-05 DIAGNOSIS — I25110 Atherosclerotic heart disease of native coronary artery with unstable angina pectoris: Secondary | ICD-10-CM

## 2023-01-05 DIAGNOSIS — E1165 Type 2 diabetes mellitus with hyperglycemia: Secondary | ICD-10-CM | POA: Diagnosis not present

## 2023-01-05 DIAGNOSIS — Z01818 Encounter for other preprocedural examination: Secondary | ICD-10-CM | POA: Insufficient documentation

## 2023-01-05 DIAGNOSIS — I495 Sick sinus syndrome: Secondary | ICD-10-CM

## 2023-01-05 DIAGNOSIS — I4891 Unspecified atrial fibrillation: Secondary | ICD-10-CM | POA: Diagnosis not present

## 2023-01-05 DIAGNOSIS — Z01812 Encounter for preprocedural laboratory examination: Secondary | ICD-10-CM

## 2023-01-05 DIAGNOSIS — Z880 Allergy status to penicillin: Secondary | ICD-10-CM

## 2023-01-05 DIAGNOSIS — I2119 ST elevation (STEMI) myocardial infarction involving other coronary artery of inferior wall: Secondary | ICD-10-CM | POA: Diagnosis not present

## 2023-01-05 DIAGNOSIS — M1611 Unilateral primary osteoarthritis, right hip: Secondary | ICD-10-CM

## 2023-01-05 DIAGNOSIS — Z01810 Encounter for preprocedural cardiovascular examination: Secondary | ICD-10-CM | POA: Diagnosis not present

## 2023-01-05 HISTORY — DX: Essential (primary) hypertension: I10

## 2023-01-05 HISTORY — DX: Nausea with vomiting, unspecified: R11.2

## 2023-01-05 HISTORY — DX: Hyperlipidemia, unspecified: E78.5

## 2023-01-05 HISTORY — DX: Other specified postprocedural states: Z98.890

## 2023-01-05 LAB — CBC
HCT: 31 % — ABNORMAL LOW (ref 36.0–46.0)
Hemoglobin: 9.9 g/dL — ABNORMAL LOW (ref 12.0–15.0)
MCH: 32.8 pg (ref 26.0–34.0)
MCHC: 31.9 g/dL (ref 30.0–36.0)
MCV: 102.6 fL — ABNORMAL HIGH (ref 80.0–100.0)
Platelets: 220 10*3/uL (ref 150–400)
RBC: 3.02 MIL/uL — ABNORMAL LOW (ref 3.87–5.11)
RDW: 14.1 % (ref 11.5–15.5)
WBC: 6.8 10*3/uL (ref 4.0–10.5)
nRBC: 0 % (ref 0.0–0.2)

## 2023-01-05 LAB — SURGICAL PCR SCREEN
MRSA, PCR: NEGATIVE
Staphylococcus aureus: POSITIVE — AB

## 2023-01-05 LAB — COMPREHENSIVE METABOLIC PANEL
ALT: 15 U/L (ref 0–44)
AST: 19 U/L (ref 15–41)
Albumin: 3.5 g/dL (ref 3.5–5.0)
Alkaline Phosphatase: 118 U/L (ref 38–126)
Anion gap: 8 (ref 5–15)
BUN: 29 mg/dL — ABNORMAL HIGH (ref 8–23)
CO2: 30 mmol/L (ref 22–32)
Calcium: 9.2 mg/dL (ref 8.9–10.3)
Chloride: 100 mmol/L (ref 98–111)
Creatinine, Ser: 1.4 mg/dL — ABNORMAL HIGH (ref 0.44–1.00)
GFR, Estimated: 38 mL/min — ABNORMAL LOW (ref >60)
Glucose, Bld: 134 mg/dL — ABNORMAL HIGH (ref 70–99)
Potassium: 4.2 mmol/L (ref 3.5–5.1)
Sodium: 138 mmol/L (ref 135–145)
Total Bilirubin: 0.6 mg/dL (ref 0.3–1.2)
Total Protein: 6.9 g/dL (ref 6.5–8.1)

## 2023-01-05 LAB — TYPE AND SCREEN

## 2023-01-05 LAB — HEMOGLOBIN A1C
Hgb A1c MFr Bld: 6.1 % — ABNORMAL HIGH (ref 4.8–5.6)
Mean Plasma Glucose: 128.37 mg/dL

## 2023-01-05 LAB — URINALYSIS, ROUTINE W REFLEX MICROSCOPIC
Bilirubin Urine: NEGATIVE
Glucose, UA: NEGATIVE mg/dL
Hgb urine dipstick: NEGATIVE
Ketones, ur: NEGATIVE mg/dL
Nitrite: NEGATIVE
Protein, ur: NEGATIVE mg/dL
Specific Gravity, Urine: 1.013 (ref 1.005–1.030)
pH: 7 (ref 5.0–8.0)

## 2023-01-05 LAB — C-REACTIVE PROTEIN: CRP: 0.6 mg/dL (ref <1.0)

## 2023-01-05 LAB — SEDIMENTATION RATE: Sed Rate: 52 mm/hr — ABNORMAL HIGH (ref 0–30)

## 2023-01-05 NOTE — Patient Instructions (Addendum)
Your procedure is scheduled on: Monday, July 1 Report to the Registration Desk on the 1st floor of the [facility] To find out your arrival time, please call [phone] between 1PM - 3PM on: Friday, June 28 If your arrival time is 6:00 am, do not arrive before that time as the [facility] entrance doors do not open until 6:00 am.  REMEMBER: Instructions that are not followed completely may result in serious medical risk, up to and including death; or upon the discretion of your surgeon and anesthesiologist your surgery may need to be rescheduled.  Do not eat food after midnight the night before surgery.  No gum chewing or hard candies.  You may however, drink water up to 2 hours before you are scheduled to arrive for your surgery. Do not drink anything within 2 hours of your scheduled arrival time.  In addition, your doctor has ordered for you to drink the provided:  Gatorade G2 Drinking this carbohydrate drink up to two hours before surgery helps to reduce insulin resistance and improve patient outcomes. Please complete drinking 2 hours before scheduled arrival time.  One week prior to surgery: starting June 24 Stop meloxicam and Anti-inflammatories (NSAIDS) such as Advil, Aleve, Ibuprofen, Motrin, Naproxen, Naprosyn and Aspirin based products such as Excedrin, Goody's Powder, BC Powder. Stop ANY OVER THE COUNTER supplements until after surgery. You may however, continue to take Tylenol if needed for pain up until the day of surgery.  Continue taking all prescribed medications with the exception of the following:  Apixaban (Eliquis) - hold 3 days before surgery. Last day to take is Thursday, June 27. Resume AFTER surgery per surgeon instruction.  TAKE ONLY THESE MEDICATIONS THE MORNING OF SURGERY WITH A SIP OF WATER:  Atorvastatin (Lipitor) Citalopram (Celexa) Metoprolol  No Alcohol for 24 hours before or after surgery.  No Smoking including e-cigarettes for 24 hours before  surgery.  No chewable tobacco products for at least 6 hours before surgery.  No nicotine patches on the day of surgery.  Do not use any "recreational" drugs for at least a week (preferably 2 weeks) before your surgery.  Please be advised that the combination of cocaine and anesthesia may have negative outcomes, up to and including death. If you test positive for cocaine, your surgery will be cancelled.  On the morning of surgery brush your teeth with toothpaste and water, you may rinse your mouth with mouthwash if you wish. Do not swallow any toothpaste or mouthwash.  Use CHG Soap as directed on instruction sheet.  Do not wear jewelry, make-up, hairpins, clips or nail polish.  Do not wear lotions, powders, or perfumes.   Do not shave body hair from the neck down 48 hours before surgery.  Contact lenses, hearing aids and dentures may not be worn into surgery.  Do not bring valuables to the hospital. [facility] is not responsible for any missing/lost belongings or valuables.   Notify your doctor if there is any change in your medical condition (cold, fever, infection).  Wear comfortable clothing (specific to your surgery type) to the hospital.  After surgery, you can help prevent lung complications by doing breathing exercises.  Take deep breaths and cough every 1-2 hours. Your doctor may order a device called an Incentive Spirometer to help you take deep breaths.  If you are being admitted to the hospital overnight, leave your suitcase in the car. After surgery it may be brought to your room.  In case of increased patient  census, it may be necessary for you, the patient, to continue your postoperative care in the Same Day Surgery department.  If you are being discharged the day of surgery, you will not be allowed to drive home. You will need a responsible individual to drive you home and stay with you for 24 hours after surgery.   If you are taking public transportation, you will  need to have a responsible individual with you.  Please call the Pre-admissions Testing Dept. at [phone] if you have any questions about these instructions.  Surgery Visitation Policy:  Patients having surgery or a procedure may have two visitors.  Children under the age of 22 must have an adult with them who is not the patient.  Inpatient Visitation:    Visiting hours are 7 a.m. to 8 p.m. Up to four visitors are allowed at one time in a patient room. The visitors may rotate out with other people during the day.  One visitor age 6 or older may stay with the patient overnight and must be in the room by 8 p.m.    Pre-operative 5 CHG Bath Instructions   You can play a key role in reducing the risk of infection after surgery. Your skin needs to be as free of germs as possible. You can reduce the number of germs on your skin by washing with CHG (chlorhexidine gluconate) soap before surgery. CHG is an antiseptic soap that kills germs and continues to kill germs even after washing.   DO NOT use if you have an allergy to chlorhexidine/CHG or antibacterial soaps. If your skin becomes reddened or irritated, stop using the CHG and notify one of our RNs at [phone].   Please shower with the CHG soap starting 4 days before surgery using the following schedule:     Please keep in mind the following:  DO NOT shave, including legs and underarms, starting the day of your first shower.   You may shave your face at any point before/day of surgery.  Place clean sheets on your bed the day you start using CHG soap. Use a clean washcloth (not used since being washed) for each shower. DO NOT sleep with pets once you start using the CHG.   CHG Shower Instructions:  If you choose to wash your hair and private area, wash first with your normal shampoo/soap.  After you use shampoo/soap, rinse your hair and body thoroughly to remove shampoo/soap residue.  Turn the water OFF and apply about 3  tablespoons (45 ml) of CHG soap to a CLEAN washcloth.  Apply CHG soap ONLY FROM YOUR NECK DOWN TO YOUR TOES (washing for 3-5 minutes)  DO NOT use CHG soap on face, private areas, open wounds, or sores.  Pay special attention to the area where your surgery is being performed.  If you are having back surgery, having someone wash your back for you may be helpful. Wait 2 minutes after CHG soap is applied, then you may rinse off the CHG soap.  Pat dry with a clean towel  Put on clean clothes/pajamas   If you choose to wear lotion, please use ONLY the CHG-compatible lotions on the back of this paper.     Additional instructions for the day of surgery: DO NOT APPLY any lotions, deodorants, cologne, or perfumes.   Put on clean/comfortable clothes.  Brush your teeth.  Ask your nurse before applying any prescription medications to the skin.      CHG Compatible Lotions  Aveeno Moisturizing lotion  Cetaphil Moisturizing Cream  Cetaphil Moisturizing Lotion  Clairol Herbal Essence Moisturizing Lotion, Dry Skin  Clairol Herbal Essence Moisturizing Lotion, Extra Dry Skin  Clairol Herbal Essence Moisturizing Lotion, Normal Skin  Curel Age Defying Therapeutic Moisturizing Lotion with Alpha Hydroxy  Curel Extreme Care Body Lotion  Curel Soothing Hands Moisturizing Hand Lotion  Curel Therapeutic Moisturizing Cream, Fragrance-Free  Curel Therapeutic Moisturizing Lotion, Fragrance-Free  Curel Therapeutic Moisturizing Lotion, Original Formula  Eucerin Daily Replenishing Lotion  Eucerin Dry Skin Therapy Plus Alpha Hydroxy Crme  Eucerin Dry Skin Therapy Plus Alpha Hydroxy Lotion  Eucerin Original Crme  Eucerin Original Lotion  Eucerin Plus Crme Eucerin Plus Lotion  Eucerin TriLipid Replenishing Lotion  Keri Anti-Bacterial Hand Lotion  Keri Deep Conditioning Original Lotion Dry Skin Formula Softly Scented  Keri Deep Conditioning Original Lotion, Fragrance Free Sensitive Skin Formula  Keri  Lotion Fast Absorbing Fragrance Free Sensitive Skin Formula  Keri Lotion Fast Absorbing Softly Scented Dry Skin Formula  Keri Original Lotion  Keri Skin Renewal Lotion Keri Silky Smooth Lotion  Keri Silky Smooth Sensitive Skin Lotion  Nivea Body Creamy Conditioning Oil  Nivea Body Extra Enriched Lotion  Nivea Body Original Lotion  Nivea Body Sheer Moisturizing Lotion Nivea Crme  Nivea Skin Firming Lotion  NutraDerm 30 Skin Lotion  NutraDerm Skin Lotion  NutraDerm Therapeutic Skin Cream  NutraDerm Therapeutic Skin Lotion  ProShield Protective Hand Cream  Provon moisturizing lotion  Preoperative Educational Videos for Total Hip, Knee and Shoulder Replacements  To better prepare for surgery, please view our videos that explain the physical activity and discharge planning required to have the best surgical recovery at [facility].  [email or web address]  Questions? Call [phone] or email [email or web address]

## 2023-01-06 LAB — TYPE AND SCREEN
ABO/RH(D): A NEG
Antibody Screen: NEGATIVE

## 2023-01-08 LAB — IGE: IgE (Immunoglobulin E), Serum: 154 IU/mL (ref 6–495)

## 2023-01-11 ENCOUNTER — Encounter: Payer: Self-pay | Admitting: Orthopedic Surgery

## 2023-01-13 ENCOUNTER — Encounter: Payer: Self-pay | Admitting: Orthopedic Surgery

## 2023-01-13 NOTE — Progress Notes (Signed)
Perioperative / Anesthesia Services  Pre-Admission Testing Clinical Review / Preoperative Anesthesia Consult  Date: 01/14/23  Patient Demographics:  Name: [name] DOB:   05-24-1941 MRN:   [id number]  Planned Surgical Procedure(s):    Case: [id number] Date/Time: 01/18/23 0700   Procedure: TOTAL HIP ARTHROPLASTY (Right: Hip)   Anesthesia type: Choice   Pre-op diagnosis: Primary osteoarthritis of right hip M16.11   Location: [facility] OR ROOM 01 / [facility]   Surgeons: Donato Heinz, MD     NOTE: Available PAT nursing documentation and vital signs have been reviewed. Clinical nursing staff has updated patient's PMH/PSHx, current medication list, and drug allergies/intolerances to ensure comprehensive history available to assist in medical decision making as it pertains to the aforementioned surgical procedure and anticipated anesthetic course. Extensive review of available clinical information personally performed. [place] [facility] and PSHx updated with any diagnoses/procedures that  may have been inadvertently omitted during her intake with the pre-admission testing department's nursing staff.  Clinical Discussion:  Brooke Davis is a 82 y.o. female who is submitted for pre-surgical anesthesia review and clearance prior to her undergoing the above procedure. Patient is a Former Smoker (quit 07/1978). Pertinent PMH includes: CAD, inferior STEMI, atrial fibrillation, diastolic dysfunction, chronic small vessel cerebrovascular disease, HTN, HLD, T2DM, OA, lumbar DDD, anxiety (on BZO).   Patient is followed by cardiology Darrold Junker, MD). She was last seen in the cardiology clinic on 11/10/2022; notes reviewed. At the time of her clinic visit, patient reporting episodes of lower extremity edema, however noted that this symptom was stable and at baseline.  Respiratory status had improved overall. Patient denied any chest pain, shortness of breath, PND, orthopnea,  palpitations, significant peripheral edema, weakness, fatigue, vertiginous symptoms, or presyncope/syncope. Patient with a past medical history significant for cardiovascular diagnoses. Documented physical exam was grossly benign, providing no evidence of acute exacerbation and/or decompensation of the patient's known cardiovascular conditions.  Patient suffered an inferior wall STEMI on 01/11/2021.  Diagnostic LEFT heart catheterization was performed revealing multivessel CAD; 40% ostial LM, 50% mid LCx, 30% ostial LCx, 30% proximal LAD, 30% mid LAD, 99% proximal RCA, 75% proximal to mid RCA, and 60% distal RCA.  Culprit lesion identified as being the 99% lesion within the proximal RCA.  PCI was subsequently performed placing a 2.75 x 30 mm resolute Onyx DES x 1 to the proximal RCA. Procedure yielded excellent angiographic result and TIMI-3 flow.  TTE performed on 01/12/2021 revealed a normal left ventricular systolic function with an EF of 55-60%.  Left ventricle mildly dilated. Left ventricular diastolic Doppler parameters consistent with abnormal relaxation (G1DD).  Right ventricle was noted to be moderately enlarged with low normal systolic function.  Left atrium was mildly to moderately dilated.  Right atrium was mildly dilated.  There was mild mitral and aortic valve, in addition to moderate tricuspid valve regurgitation.  All transvalvular gradients were noted to be normal providing no evidence suggestive of valvular stenosis.  Aorta normal in size with no evidence of aneurysmal dilatation.  Patient with an atrial fibrillation diagnosis; CHA2DS2-VASc Score = 5 (age x 2, sex, HTN, T2DM). Her rate and rhythm are currently being maintained on oral metoprolol tartrate. She is chronically anticoagulated using apixaban; reported to be compliant with therapy with no evidence or reports of GI bleeding.  Blood pressure reasonably controlled at 130/82 mmHg on currently prescribed beta-blocker (metoprolol  tartrate), diuretic (torsemide), and ARB/ARNI (Entresto) therapies. She is on atorvastatin for her HLD diagnosis  and further ASCVD prevention. T2DM well controlled on currently prescribed regimen; last HgbA1c was 6.8% when checked on 10/29/2022.  Functional capacity limited by patient's age, arthritides, and multiple medical comorbidities.  With that being said, patient felt to be able to achieve at least 4 METS of physical activity without experiencing any significant degrees of angina/anginal equivalent symptoms.  No changes were made to her medication regimen.  Patient to follow-up with outpatient cardiology in 4 months or sooner if needed.  Brooke Davis is scheduled for an elective TOTAL HIP ARTHROPLASTY (Right: Hip) on 01/18/2023 with Dr. Francesco Sor, MD.  Given patient's past medical history significant for cardiovascular diagnoses, presurgical cardiac clearance was sought by the PAT team. Per cardiology, "this patient is optimized for surgery and may proceed with the planned procedural course with a ACCEPTABLE risk of significant perioperative cardiovascular complications".  Again, this patient is on daily oral anticoagulation therapy using a DOAC.  She has been instructed on recommendations for holding her apixaban for 3 days prior to her procedure with plans to restart as soon as postoperative bleeding risk felt to be minimized by her attending surgeon. The patient has been instructed that her last dose of her apixaban should be on 01/14/2023.  Patient reports previous perioperative complications with anesthesia in the past. Patient has a PMH (+) for PONV. Symptoms and history of PONV will be discussed with patient by anesthesia team on the day of her procedure. Interventions will be ordered as deemed necessary based on patient's individual care needs as determined by anesthesiologist. In review of the available records, it is noted that patient underwent a MAC anesthetic course at [facility] (ASA III) in 04/2020 without documented complications.      01/05/2023    1:07 PM 10/03/2021   11:00 AM 10/03/2021   10:30 AM  Vitals with BMI  Height 5\' 2"     Weight 184 lbs 10 oz    BMI 33.76    Systolic 174 214 829  Diastolic 70 71 65  Pulse 56 72 66    Providers/Specialists:   NOTE: Primary physician provider listed below. Patient may have been seen by APP or partner within same practice.   PROVIDER ROLE / SPECIALTY LAST OV  Hooten, Illene Labrador, MD Orthopedics (Surgeon) 01/05/2023  [name], MD Primary Care Provider 11/05/2022  Marcina Millard, MD Cardiology 11/10/2022   Allergies:  Celebrex [celecoxib], Penicillins, and Relafen [nabumetone]  Current Home Medications:   No current facility-administered medications for this encounter.    allopurinol (ZYLOPRIM) 300 MG tablet   ALPRAZolam (XANAX) 0.25 MG tablet   apixaban (ELIQUIS) 5 MG TABS tablet   atorvastatin (LIPITOR) 80 MG tablet   citalopram (CELEXA) 10 MG tablet   meloxicam (MOBIC) 15 MG tablet   metoprolol tartrate (LOPRESSOR) 25 MG tablet   potassium chloride (KLOR-CON) 10 MEQ tablet   sacubitril-valsartan (ENTRESTO) 24-26 MG   torsemide (DEMADEX) 20 MG tablet   traMADol (ULTRAM) 50 MG tablet   History:   Past Medical History:  Diagnosis Date   Anxiety    a.) on BZO (alprazolam) PRN   Arthritis    fingers, right hip   Atrial fibrillation (HCC) 11/10/2013   a.) CHA2DS2VASc = 5 (age x2, sex, HTN, T2DM);  b.) rate/rhythm maintained on oral metoprolol tartrate; chronically anticoagulated with apixaban   Bronchitis    Cerebrovascular small vessel disease (chronic)    Coronary artery disease involving native coronary artery of native heart 01/11/2021   a.) LHC 01/11/2021:  40% oLM, 50% mLCx, 30% oLCx, 30% pLAD, 30% mLAD, 99% pRCA (2.75 x 30 mm Resolute Onyx DES), 75% p-mRCA, 60% dRCA   DDD (degenerative disc disease), lumbar 12/01/2013   Diastolic dysfunction 01/12/2021   a.) TTE 01/12/2021  (s/p inf STEMI): EF 55-60%, mild-mod LAE, mild RAE, mod RVE, mild AR/MR, mod TR, G1DD   Diet-controlled type 2 diabetes mellitus (HCC)    Full dentures    Gout    History of bilateral cataract extraction 2021   Hyperlipidemia    Hypertension, essential    Long term current use of anticoagulant    a.) apixaban   PONV (postoperative nausea and vomiting)    Sinoatrial node dysfunction (HCC) 11/10/2013   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  All OK.   CATARACT EXTRACTION W/PHACO Left 03/27/2020   Procedure: CATARACT EXTRACTION PHACO AND INTRAOCULAR LENS PLACEMENT (IOC) LEFT 7.49  00:56.4  13.3%;  Surgeon: Lockie Mola, MD;  Location: [facility];  Service: Ophthalmology;  Laterality: Left;   CATARACT EXTRACTION W/PHACO Right 04/24/2020   Procedure: CATARACT EXTRACTION PHACO AND INTRAOCULAR LENS PLACEMENT (IOC) RIGHT;  Surgeon: Lockie Mola, MD;  Location: [facility];  Service: Ophthalmology;  Laterality: Right;  8.34 0:50.2 16.6%   CORONARY/GRAFT ACUTE MI REVASCULARIZATION N/A 01/11/2021   Procedure: Coronary/Graft Acute MI Revascularization;  Surgeon: Marcina Millard, MD;  Location: [facility] INV[facility];  Service: Cardiovascular;  Laterality: N/A;   LEFT HEART CATH AND CORONARY ANGIOGRAPHY N/A 01/11/2021   Procedure: LEFT HEART CATH AND CORONARY ANGIOGRAPHY;  Surgeon: Marcina Millard, MD;  Location: [facility] INV[facility] CV [facility];  Service: Cardiovascular;  Laterality: N/A;   PARTIAL HYSTERECTOMY     TOE SURGERY Left    great toe; rod in place   TOTAL KNEE ARTHROPLASTY Right 2007   Family History  Problem Relation Age of Onset    Heart attack Mother 41       required open heart surgery. Onset age is unknown.   Stroke Father    Diabetes Father    Alcoholism Brother    Breast cancer Neg Hx    Social History   Tobacco Use   Smoking status: Former    Types: Cigarettes    Quit date: 1980    Years since quitting: 44.5   Smokeless tobacco: Never  Vaping Use   Vaping Use: Never used  Substance Use Topics   Alcohol use: Not Currently   Drug use: Never    Pertinent Clinical Results:  LABS:   No visits with results within 3 Day(s) from this visit.  Latest known visit with results is:  Hospital Outpatient Visit on 01/05/2023  Component Date Value Ref Range Status   WBC 01/05/2023 6.8  4.0 - 10.5 K/uL Final   RBC 01/05/2023 3.02 (L)  3.87 - 5.11 MIL/uL Final   Hemoglobin 01/05/2023 9.9 (L)  12.0 - 15.0 g/dL Final   HCT 16/04/9603 31.0 (L)  36.0 - 46.0 % Final   MCV 01/05/2023 102.6 (H)  80.0 - 100.0 fL Final   MCH 01/05/2023 32.8  26.0 - 34.0 pg Final  MCHC 01/05/2023 31.9  30.0 - 36.0 g/dL Final   RDW 40/98/1191 14.1  11.5 - 15.5 % Final   Platelets 01/05/2023 220  150 - 400 K/uL Final   nRBC 01/05/2023 0.0  0.0 - 0.2 % Final   Performed at [facility], [address] [place], [place] [address]   Sodium 01/05/2023 138  135 - 145 mmol/L Final   Potassium 01/05/2023 4.2  3.5 - 5.1 mmol/L Final   Chloride 01/05/2023 100  98 - 111 mmol/L Final   CO2 01/05/2023 30  22 - 32 mmol/L Final   Glucose, Bld 01/05/2023 134 (H)  70 - 99 mg/dL Final   Glucose reference range applies only to samples taken after fasting for at least 8 hours.   BUN 01/05/2023 29 (H)  8 - 23 mg/dL Final   Creatinine, Ser 01/05/2023 1.40 (H)  0.44 - 1.00 mg/dL Final   Calcium 56/21/3086 9.2  8.9 - 10.3 mg/dL Final   Total Protein 57/84/6962 6.9  6.5 - 8.1 g/dL Final   Albumin 95/28/4132 3.5  3.5 - 5.0 g/dL Final   AST 44/07/270 19  15 - 41 U/L Final   ALT 01/05/2023 15  0 - 44 U/L Final   Alkaline Phosphatase 01/05/2023 118   38 - 126 U/L Final   Total Bilirubin 01/05/2023 0.6  0.3 - 1.2 mg/dL Final   GFR, Estimated 01/05/2023 38 (L)  >60 mL/min Final   Comment: (NOTE) Calculated using the CKD-EPI Creatinine Equation (2021)    Anion gap 01/05/2023 8  5 - 15 Final   Performed at [facility], [address] [place], [place] [address]   Color, Urine 01/05/2023 YELLOW (A)  YELLOW Final   APPearance 01/05/2023 HAZY (A)  CLEAR Final   Specific Gravity, Urine 01/05/2023 1.013  1.005 - 1.030 Final   pH 01/05/2023 7.0  5.0 - 8.0 Final   Glucose, UA 01/05/2023 NEGATIVE  NEGATIVE mg/dL Final   Hgb urine dipstick 01/05/2023 NEGATIVE  NEGATIVE Final   Bilirubin Urine 01/05/2023 NEGATIVE  NEGATIVE Final   Ketones, ur 01/05/2023 NEGATIVE  NEGATIVE mg/dL Final   Protein, ur 40/34/7425 NEGATIVE  NEGATIVE mg/dL Final   Nitrite 95/63/8756 NEGATIVE  NEGATIVE Final   Leukocytes,Ua 01/05/2023 TRACE (A)  NEGATIVE Final   RBC / HPF 01/05/2023 0-5  0 - 5 RBC/hpf Final   WBC, UA 01/05/2023 0-5  0 - 5 WBC/hpf Final   Bacteria, UA 01/05/2023 RARE (A)  NONE SEEN Final   Squamous Epithelial / HPF 01/05/2023 6-10  0 - 5 /HPF Final   Performed at [facility], [address] [place], [place] [address]   CRP 01/05/2023 0.6  <1.0 mg/dL Final   Performed at [facility], [address], [place], [place] [address]   Sed Rate 01/05/2023 52 (H)  0 - 30 mm/hr Final   Performed at [facility], [address] [place], [place] [address]   Hgb A1c MFr Bld 01/05/2023 6.1 (H)  4.8 - 5.6 % Final   Comment: (NOTE) Pre diabetes:          5.7%-6.4%  Diabetes:              >6.4%  Glycemic control for   <7.0% adults with diabetes    Mean Plasma Glucose 01/05/2023 128.37  mg/dL Final   Performed at [facility], [address], [place], [place] [address]   IgE (Immunoglobulin E), Serum 01/05/2023 154  6 -  495 IU/mL Final   Comment: (NOTE) Performed At: [facility] [address]  [place], [place] [id number] Jolene Schimke MD CZ:[id number]    ABO/RH(D) 01/05/2023 A NEG   Final   Antibody Screen 01/05/2023 NEG   Final   Sample Expiration 01/05/2023 01/19/2023,2359   Final   Extend sample reason 01/05/2023    Final                   Value:NO TRANSFUSIONS OR PREGNANCY IN THE PAST 3 MONTHS Performed at [facility], [address] [place], [place] [address]    MRSA, PCR 01/05/2023 NEGATIVE  NEGATIVE Final   Staphylococcus aureus 01/05/2023 POSITIVE (A)  NEGATIVE Final   Comment: (NOTE) The Xpert SA Assay (FDA approved for NASAL specimens in patients 39 years of age and older), is one component of a comprehensive surveillance program. It is not intended to diagnose infection nor to guide or monitor treatment. Performed at [facility], [address] [place], [place] [address]     ECG: Date: 01/05/2023 Time ECG obtained: 1104 AM Rate: 64 bpm Rhythm: atrial fibrillation Axis (leads I and aVF): Normal Intervals: QRS 90 ms. QTc 466 ms. ST segment and T wave changes: No evidence of acute ST segment elevation or depression.  Evidence of an age undetermined septal infarct present Comparison: Similar to previous tracing obtained on 10/03/2021    IMAGING / PROCEDURES: DIAGNOSTIC RADIOGRAPHS OF RIGHT HIP 2 OR 3 VIEWS WITH OR WITHOUT PELVIS performed on 11/26/2022 Significant narrowing of the cartilage space with bone-on-bone articulation and flattening of the superior aspect of the femoral head. Subchondral sclerosis is noted. Osteophyte formation is present  TRANSTHORACIC ECHOCARDIOGRAM performed on 01/12/2021 Left ventricular ejection fraction, by estimation, is 55 to 60%. The left ventricle has normal function. The left ventricle demonstrates regional wall motion abnormalities. The left ventricular internal cavity size was mildly dilated. Left ventricular diastolic parameters are consistent with Grade I diastolic dysfunction (impaired  relaxation).  Right ventricular systolic function is low normal. The right ventricular size is moderately enlarged.  Left atrial size was mild to moderately dilated.  Right atrial size was mildly dilated.  The mitral valve is abnormal. Mild mitral valve regurgitation.  Tricuspid valve regurgitation is moderate.  The aortic valve is normal in structure. Aortic valve regurgitation is mild.   LEFT HEART CATHETERIZATION AND CORONARY ANGIOGRAPHY performed on 01/11/2021 Mildly reduced left ventricular systolic function with inferior hypokinesis; EF 45-50 Multivessel CAD 40% ostial LM 50% mid LCx 30% ostial LCx 30% proximal LAD 30% mid LAD 99% proximal RCA 75% proximal to mid RCA 60% distal RCA Successful PCI 2.75 x 30 mm Resolute Onyx DES x 1 to the mid and proximal RCA yielding excellent angiographic result and TIMI-3 flow Recommendations Dual antiplatelet therapy uninterrupted for 1 year Metoprolol tartrate 25 mg twice daily High intensity atorvastatin 80 mg daily 2D echocardiogram    Impression and Plan:  Brooke Davis has been referred for pre-anesthesia review and clearance prior to her undergoing the planned anesthetic and procedural courses. Available labs, pertinent testing, and imaging results were personally reviewed by me in preparation for upcoming operative/procedural course. [facility] medical record has been updated following extensive record review and patient interview with PAT staff.   This patient has been appropriately cleared by cardiology with an overall ACCEPTABLE risk of experiencing significant perioperative cardiovascular complications. Based on clinical review performed today (01/14/23), barring any significant acute changes in the patient's overall condition, it is anticipated that  she will be able to proceed with the planned surgical intervention. Any acute changes in clinical condition may necessitate her procedure being postponed and/or cancelled. Patient  will meet with anesthesia team (MD and/or CRNA) on the day of her procedure for preoperative evaluation/assessment. Questions regarding anesthetic course will be fielded at that time.   Pre-surgical instructions were reviewed with the patient during her PAT appointment, and questions were fielded to satisfaction by PAT clinical staff. She has been instructed on which medications that she will need to hold prior to surgery, as well as the ones that have been deemed safe/appropriate to take on the day of her procedure. As part of the general education provided by PAT, patient made aware both verbally and in writing, that she would need to abstain from the use of any illegal substances during her perioperative course.  She was advised that failure to follow the provided instructions could necessitate case cancellation or result in serious perioperative complications up to and including death. Patient encouraged to contact PAT and/or her surgeon's office to discuss any questions or concerns that may arise prior to surgery; verbalized understanding.   Quentin Mulling, MSN, APRN, FNP-C, CEN [facility]  Peri-operative Services Nurse Practitioner Phone: [phone] Fax: [phone] 01/14/23 11:39 AM  NOTE: This note has been prepared using Dragon dictation software. Despite my best ability to proofread, there is always the potential that unintentional transcriptional errors may still occur from this process.

## 2023-01-17 ENCOUNTER — Encounter: Payer: Self-pay | Admitting: Orthopedic Surgery

## 2023-01-17 NOTE — H&P (Signed)
ORTHOPAEDIC HISTORY & PHYSICAL Latanya Maudlin, PA - 01/05/2023 10:00 AM EDT Formatting of this note is different from the original. Images from the original note were not included. Chief Complaint Chief Complaint Patient presents with Hip Pain H & P RIGHT HIP  Reason for Visit Brooke Davis is a 82 y.o. who presents today for a history and physical. She is to undergo a right total hip arthroplasty on 01/18/2023. Since her last visit to clinic there have been no change in her condition. Patient first surgery was later to proceed with surgery.  She reports a 3-year history of right hip, buttocks, and groin pain. The pain is worse with weight bearing. She has appreciated progressive decrease in her left hip range of motion. The hip pain limits the patient's ability to ambulate long distances. The patient has not appreciated any significant improvement despite NSAIDs, activity modification, ambulatory aids, and tramadol. She is using a walker for ambulation. The patient states that the hip pain has progressed to the point that it is significantly interfering with her activities of daily living.  Past Medical History Past Medical History: Diagnosis Date Arthritis Diabetes mellitus type 2, uncomplicated (CMS/HHS-HCC) DIET CONTROLLED Gout, joint Hypertension  Past Surgical History Past Surgical History: Procedure Laterality Date HYSTERECTOMY 1984 INTRO CATH TO MAIN PULMONARY ARTERY/RIGHT HEART 5/01  Past Family History Family History Problem Relation Age of Onset Diabetes type II Other Rheum arthritis Maternal Grandmother  Medications Current Outpatient Medications Medication Sig Dispense Refill allopurinoL (ZYLOPRIM) 300 MG tablet TAKE 1 TABLET(300 MG) BY MOUTH EVERY DAY 90 tablet 3 ALPRAZolam (XANAX) 0.25 MG tablet TAKE 1 TABLET(0.25 MG) BY MOUTH TWICE DAILY AS NEEDED FOR SLEEP, STRESS, ANXIETY 180 tablet 1 apixaban (ELIQUIS) 5 mg tablet Take 1 tablet (5 mg total) by mouth  every 12 (twelve) hours 60 tablet 11 atorvastatin (LIPITOR) 80 MG tablet Take 1 tablet (80 mg total) by mouth once daily 90 tablet 2 citalopram (CELEXA) 10 MG tablet Take 1 tablet (10 mg total) by mouth once daily 30 tablet 11 meloxicam (MOBIC) 15 MG tablet TAKE 1 TABLET(15 MG) BY MOUTH EVERY DAY 90 tablet 3 metoprolol tartrate (LOPRESSOR) 25 MG tablet Take 1 tablet (25 mg total) by mouth 2 (two) times daily 60 tablet 11 pantoprazole (PROTONIX) 20 MG DR tablet Take 1 tablet (20 mg total) by mouth once daily 30 tablet 3 potassium chloride (KLOR-CON) 10 MEQ ER tablet Take 1 tablet (10 mEq total) by mouth once daily 30 tablet 11 sacubitriL-valsartan (ENTRESTO) 24-26 mg tablet Take 1 tablet by mouth every 12 (twelve) hours 60 tablet 11 TORsemide (DEMADEX) 20 MG tablet Take 1 tablet (20 mg total) by mouth once daily 30 tablet 11 traMADoL (ULTRAM) 50 mg tablet Take 2 tablets (100 mg total) by mouth every 8 (eight) hours as needed for Pain 90 tablet 0  No current facility-administered medications for this visit.  Allergies Allergies Allergen Reactions Nabumetone Palpitations Celebrex [Celecoxib] Diarrhea SEVERE  Lovastatin Diarrhea Penicillins Itching   Review of Systems A comprehensive 14 point ROS was performed, reviewed, and the pertinent orthopaedic findings are documented in the HPI.  Exam BP 130/82 (BP Location: Left upper arm, Patient Position: Sitting, BP Cuff Size: Large Adult)  Ht 157.5 cm (5\' 2" )  Wt 83.6 kg (184 lb 3.2 oz)  BMI 33.69 kg/m  General: Well-developed well-nourished female seen in no acute distress.  HEENT: Atraumatic,normocephalic. Pupils are equal and reactive to light. Oropharynx is clear with moist mucosa  Lungs: Clear to auscultation bilaterally  Cardiovascular: Irregular rate and rhythm. Normal S1, S2. 2/6 systolic murmur. Patient aware of this. No appreciable gallops or rubs. Peripheral pulses are palpable.  Abdomen: Soft, non-tender,  nondistended. Bowel sounds present  Extremity: Right Hip: Pelvic tilt: Negative Limb lengths: Equal with the patient standing Soft tissue swelling:Negative Erythema: Negative Crepitance: Positive Tenderness: Greater trochanter is nontender to palpation. Moderate pain is elicited by axial compression or extremes of rotation. Atrophy: No atrophy. Fair to good hip flexor and abductor strength. Range of Motion:EXT/FLEX: 0/0/90 ADD/ABD: 20/0/20 IR/ER: 10/0/10  Neurological:  The patient is alert and oriented Sensation to light touch appears to be intact and within normal limits Gross motor strength appeared to be equal to 5/5  Vascular :  Peripheral pulses felt to be palpable. Capillary refill appears to be intact and within normal limits   X-ray  X-rays taken in clinic on 11/26/2022 of the right hip shows significant narrowing of the cartilage space with bone-on-bone articulation and flattening of the superior aspect of the femoral head. Subchondral sclerosis is noted along with osteophyte formation. No acute bony abnormalities noted.  Impression  1. Degenerative arthrosis right hip  Plan  1. Patient is to discontinue her meloxicam 1 week prior to surgery and Eliquis 3 days prior to surgery 2. Past medical history reviewed 3. Postop rehab course there was discussed 4. Return to clinic 6 weeks postop. Sooner if any problems  This note was generated in part with voice recognition software and I apologize for any typographical errors that were not detected and corrected   Tera Partridge PA Electronically signed by Latanya Maudlin, PA at 01/05/2023 12:08 PM EDT

## 2023-01-18 ENCOUNTER — Observation Stay: Payer: Medicare Other

## 2023-01-18 ENCOUNTER — Observation Stay
Admission: RE | Admit: 2023-01-18 | Discharge: 2023-01-19 | Disposition: A | Discharge status: Home Health | Payer: Medicare Other | Source: Ambulatory Visit | Attending: Orthopedic Surgery | Admitting: Orthopedic Surgery

## 2023-01-18 ENCOUNTER — Encounter: Payer: Self-pay | Admitting: Orthopedic Surgery

## 2023-01-18 ENCOUNTER — Encounter
Admission: RE | Disposition: A | Discharge status: Home Health | Payer: Self-pay | Source: Ambulatory Visit | Attending: Orthopedic Surgery

## 2023-01-18 ENCOUNTER — Other Ambulatory Visit: Payer: Self-pay

## 2023-01-18 ENCOUNTER — Ambulatory Visit: Payer: Medicare Other | Admitting: Urgent Care

## 2023-01-18 DIAGNOSIS — I10 Essential (primary) hypertension: Secondary | ICD-10-CM | POA: Insufficient documentation

## 2023-01-18 DIAGNOSIS — E119 Type 2 diabetes mellitus without complications: Secondary | ICD-10-CM | POA: Diagnosis not present

## 2023-01-18 DIAGNOSIS — I4891 Unspecified atrial fibrillation: Secondary | ICD-10-CM | POA: Diagnosis not present

## 2023-01-18 DIAGNOSIS — I2510 Atherosclerotic heart disease of native coronary artery without angina pectoris: Secondary | ICD-10-CM | POA: Insufficient documentation

## 2023-01-18 DIAGNOSIS — Z01812 Encounter for preprocedural laboratory examination: Secondary | ICD-10-CM

## 2023-01-18 DIAGNOSIS — M1611 Unilateral primary osteoarthritis, right hip: Principal | ICD-10-CM | POA: Insufficient documentation

## 2023-01-18 DIAGNOSIS — I495 Sick sinus syndrome: Secondary | ICD-10-CM

## 2023-01-18 DIAGNOSIS — Z96641 Presence of right artificial hip joint: Secondary | ICD-10-CM

## 2023-01-18 DIAGNOSIS — Z7901 Long term (current) use of anticoagulants: Secondary | ICD-10-CM | POA: Diagnosis not present

## 2023-01-18 DIAGNOSIS — I25110 Atherosclerotic heart disease of native coronary artery with unstable angina pectoris: Secondary | ICD-10-CM

## 2023-01-18 DIAGNOSIS — I2119 ST elevation (STEMI) myocardial infarction involving other coronary artery of inferior wall: Secondary | ICD-10-CM

## 2023-01-18 DIAGNOSIS — Z880 Allergy status to penicillin: Secondary | ICD-10-CM

## 2023-01-18 DIAGNOSIS — Z79899 Other long term (current) drug therapy: Secondary | ICD-10-CM | POA: Diagnosis not present

## 2023-01-18 DIAGNOSIS — E1165 Type 2 diabetes mellitus with hyperglycemia: Secondary | ICD-10-CM

## 2023-01-18 HISTORY — DX: Cerebrovascular disease, unspecified: I67.9

## 2023-01-18 HISTORY — DX: Type 2 diabetes mellitus without complications: E11.9

## 2023-01-18 HISTORY — DX: Long term (current) use of anticoagulants: Z79.01

## 2023-01-18 HISTORY — DX: Anxiety disorder, unspecified: F41.9

## 2023-01-18 HISTORY — DX: Complete loss of teeth, unspecified cause, unspecified class: K08.109

## 2023-01-18 HISTORY — PX: TOTAL HIP ARTHROPLASTY: SHX124

## 2023-01-18 HISTORY — DX: Complete loss of teeth, unspecified cause, unspecified class: Z97.2

## 2023-01-18 LAB — GLUCOSE, CAPILLARY
Glucose-Capillary: 113 mg/dL — ABNORMAL HIGH (ref 70–99)
Glucose-Capillary: 166 mg/dL — ABNORMAL HIGH (ref 70–99)

## 2023-01-18 LAB — ABO/RH: ABO/RH(D): A NEG

## 2023-01-18 SURGERY — ARTHROPLASTY, HIP, TOTAL,POSTERIOR APPROACH
Anesthesia: Spinal | Site: Hip | Laterality: Right

## 2023-01-18 MED ORDER — TRAMADOL HCL 50 MG PO TABS: 50.0000 mg | ORAL_TABLET | ORAL | Status: DC | PRN

## 2023-01-18 MED ORDER — TORSEMIDE 20 MG PO TABS
20.0000 mg | ORAL_TABLET | Freq: Every day | ORAL | Status: DC
  Administered 2023-01-19: 20 mg via ORAL
  Filled 2023-01-18 (×2): qty 1

## 2023-01-18 MED ORDER — APIXABAN 2.5 MG PO TABS
5.0000 mg | ORAL_TABLET | Freq: Two times a day (BID) | ORAL | Status: DC
  Administered 2023-01-19: 5 mg via ORAL

## 2023-01-18 MED ORDER — OXYCODONE HCL 5 MG PO TABS: 5.0000 mg | ORAL_TABLET | Freq: Once | ORAL | Status: DC | PRN

## 2023-01-18 MED ORDER — MIDAZOLAM HCL 2 MG/2ML IJ SOLN
INTRAMUSCULAR | Status: AC
  Filled 2023-01-18: qty 2

## 2023-01-18 MED ORDER — ONDANSETRON HCL 4 MG/2ML IJ SOLN
4.0000 mg | Freq: Once | INTRAMUSCULAR | Status: AC | PRN
  Administered 2023-01-18: 4 mg via INTRAVENOUS

## 2023-01-18 MED ORDER — 0.9 % SODIUM CHLORIDE (POUR BTL) OPTIME
TOPICAL | Status: DC | PRN
  Administered 2023-01-18: 500 mL

## 2023-01-18 MED ORDER — PANTOPRAZOLE SODIUM 40 MG PO TBEC
DELAYED_RELEASE_TABLET | ORAL | Status: AC
  Filled 2023-01-18: qty 1

## 2023-01-18 MED ORDER — BISACODYL 10 MG RE SUPP: 10.0000 mg | Freq: Every day | RECTAL | Status: DC | PRN

## 2023-01-18 MED ORDER — ALPRAZOLAM 0.5 MG PO TABS: 0.2500 mg | ORAL_TABLET | Freq: Two times a day (BID) | ORAL | Status: DC | PRN

## 2023-01-18 MED ORDER — METOPROLOL TARTRATE 25 MG PO TABS
ORAL_TABLET | ORAL | Status: AC
  Filled 2023-01-18: qty 1

## 2023-01-18 MED ORDER — ORAL CARE MOUTH RINSE: 15.0000 mL | OROMUCOSAL | Status: DC | PRN

## 2023-01-18 MED ORDER — ATORVASTATIN CALCIUM 20 MG PO TABS
80.0000 mg | ORAL_TABLET | Freq: Every day | ORAL | Status: DC
  Administered 2023-01-19: 80 mg via ORAL

## 2023-01-18 MED ORDER — SACUBITRIL-VALSARTAN 24-26 MG PO TABS
1.0000 | ORAL_TABLET | Freq: Two times a day (BID) | ORAL | Status: DC
  Administered 2023-01-18 – 2023-01-19 (×3): 1 via ORAL
  Filled 2023-01-18 (×3): qty 1

## 2023-01-18 MED ORDER — HYDROMORPHONE HCL 1 MG/ML IJ SOLN: 0.5000 mg | INTRAMUSCULAR | Status: DC | PRN

## 2023-01-18 MED ORDER — PROPOFOL 1000 MG/100ML IV EMUL
INTRAVENOUS | Status: AC
  Filled 2023-01-18: qty 100

## 2023-01-18 MED ORDER — POTASSIUM CHLORIDE CRYS ER 20 MEQ PO TBCR
EXTENDED_RELEASE_TABLET | ORAL | Status: AC
  Filled 2023-01-18: qty 1

## 2023-01-18 MED ORDER — ALUM & MAG HYDROXIDE-SIMETH 200-200-20 MG/5ML PO SUSP: 30.0000 mL | ORAL | Status: DC | PRN

## 2023-01-18 MED ORDER — ACETAMINOPHEN 325 MG PO TABS: 325.0000 mg | ORAL_TABLET | Freq: Four times a day (QID) | ORAL | Status: DC | PRN

## 2023-01-18 MED ORDER — CHLORHEXIDINE GLUCONATE 0.12 % MT SOLN
15.0000 mL | Freq: Once | OROMUCOSAL | Status: AC
  Administered 2023-01-18: 15 mL via OROMUCOSAL

## 2023-01-18 MED ORDER — SENNOSIDES-DOCUSATE SODIUM 8.6-50 MG PO TABS
ORAL_TABLET | ORAL | Status: AC
  Filled 2023-01-18: qty 1

## 2023-01-18 MED ORDER — FLEET ENEMA 7-19 GM/118ML RE ENEM: 1.0000 | ENEMA | Freq: Once | RECTAL | Status: DC | PRN

## 2023-01-18 MED ORDER — MENTHOL 3 MG MT LOZG: 1.0000 | LOZENGE | OROMUCOSAL | Status: DC | PRN

## 2023-01-18 MED ORDER — FENTANYL CITRATE (PF) 100 MCG/2ML IJ SOLN
INTRAMUSCULAR | Status: AC
  Filled 2023-01-18: qty 2

## 2023-01-18 MED ORDER — POTASSIUM CHLORIDE CRYS ER 20 MEQ PO TBCR
10.0000 meq | EXTENDED_RELEASE_TABLET | Freq: Every day | ORAL | Status: DC
  Administered 2023-01-19: 10 meq via ORAL

## 2023-01-18 MED ORDER — METOCLOPRAMIDE HCL 10 MG PO TABS
ORAL_TABLET | ORAL | Status: AC
  Filled 2023-01-18: qty 1

## 2023-01-18 MED ORDER — CHLORHEXIDINE GLUCONATE 0.12 % MT SOLN
OROMUCOSAL | Status: AC
  Filled 2023-01-18: qty 15

## 2023-01-18 MED ORDER — FERROUS SULFATE 325 (65 FE) MG PO TABS
ORAL_TABLET | ORAL | Status: AC
  Filled 2023-01-18: qty 1

## 2023-01-18 MED ORDER — CEFAZOLIN SODIUM-DEXTROSE 2-4 GM/100ML-% IV SOLN
INTRAVENOUS | Status: AC
  Filled 2023-01-18: qty 100

## 2023-01-18 MED ORDER — DEXAMETHASONE SODIUM PHOSPHATE 10 MG/ML IJ SOLN
INTRAMUSCULAR | Status: AC
  Filled 2023-01-18: qty 1

## 2023-01-18 MED ORDER — ACETAMINOPHEN 10 MG/ML IV SOLN
INTRAVENOUS | Status: AC
  Filled 2023-01-18: qty 100

## 2023-01-18 MED ORDER — ONDANSETRON HCL 4 MG/2ML IJ SOLN: 4.0000 mg | Freq: Four times a day (QID) | INTRAMUSCULAR | Status: DC | PRN

## 2023-01-18 MED ORDER — EPHEDRINE SULFATE (PRESSORS) 50 MG/ML IJ SOLN
INTRAMUSCULAR | Status: DC | PRN
  Administered 2023-01-18 (×5): 5 mg via INTRAVENOUS

## 2023-01-18 MED ORDER — CEFAZOLIN SODIUM-DEXTROSE 2-4 GM/100ML-% IV SOLN
2.0000 g | INTRAVENOUS | Status: AC
  Administered 2023-01-18: 2 g via INTRAVENOUS

## 2023-01-18 MED ORDER — ORAL CARE MOUTH RINSE: 15.0000 mL | Freq: Once | OROMUCOSAL | Status: AC

## 2023-01-18 MED ORDER — METOCLOPRAMIDE HCL 10 MG PO TABS
10.0000 mg | ORAL_TABLET | Freq: Three times a day (TID) | ORAL | Status: DC
  Administered 2023-01-18 – 2023-01-19 (×3): 10 mg via ORAL

## 2023-01-18 MED ORDER — PHENYLEPHRINE HCL-NACL 20-0.9 MG/250ML-% IV SOLN
INTRAVENOUS | Status: AC
  Filled 2023-01-18: qty 500

## 2023-01-18 MED ORDER — PHENOL 1.4 % MT LIQD: 1.0000 | OROMUCOSAL | Status: DC | PRN

## 2023-01-18 MED ORDER — TRANEXAMIC ACID-NACL 1000-0.7 MG/100ML-% IV SOLN
INTRAVENOUS | Status: AC
  Filled 2023-01-18: qty 100

## 2023-01-18 MED ORDER — GABAPENTIN 300 MG PO CAPS
300.0000 mg | ORAL_CAPSULE | Freq: Once | ORAL | Status: AC
  Administered 2023-01-18: 300 mg via ORAL

## 2023-01-18 MED ORDER — ALLOPURINOL 300 MG PO TABS
300.0000 mg | ORAL_TABLET | Freq: Every day | ORAL | Status: DC
  Administered 2023-01-18 – 2023-01-19 (×2): 300 mg via ORAL
  Filled 2023-01-18 (×2): qty 1

## 2023-01-18 MED ORDER — MIDAZOLAM HCL 2 MG/2ML IJ SOLN
INTRAMUSCULAR | Status: DC | PRN
  Administered 2023-01-18: .5 mg via INTRAVENOUS

## 2023-01-18 MED ORDER — ONDANSETRON HCL 4 MG PO TABS: 4.0000 mg | ORAL_TABLET | Freq: Four times a day (QID) | ORAL | Status: DC | PRN

## 2023-01-18 MED ORDER — PHENYLEPHRINE HCL-NACL 20-0.9 MG/250ML-% IV SOLN
INTRAVENOUS | Status: DC | PRN
  Administered 2023-01-18: 50 ug/min via INTRAVENOUS

## 2023-01-18 MED ORDER — FAMOTIDINE 20 MG PO TABS
ORAL_TABLET | ORAL | Status: AC
  Filled 2023-01-18: qty 1

## 2023-01-18 MED ORDER — FERROUS SULFATE 325 (65 FE) MG PO TABS
325.0000 mg | ORAL_TABLET | Freq: Two times a day (BID) | ORAL | Status: DC
  Administered 2023-01-18 – 2023-01-19 (×2): 325 mg via ORAL

## 2023-01-18 MED ORDER — ACETAMINOPHEN 10 MG/ML IV SOLN
1000.0000 mg | Freq: Four times a day (QID) | INTRAVENOUS | Status: DC
  Administered 2023-01-18 – 2023-01-19 (×3): 1000 mg via INTRAVENOUS

## 2023-01-18 MED ORDER — MAGNESIUM HYDROXIDE 400 MG/5ML PO SUSP
ORAL | Status: AC
  Filled 2023-01-18: qty 30

## 2023-01-18 MED ORDER — PROPOFOL 500 MG/50ML IV EMUL
INTRAVENOUS | Status: DC | PRN
  Administered 2023-01-18: 75 ug/kg/min via INTRAVENOUS

## 2023-01-18 MED ORDER — OXYCODONE HCL 5 MG PO TABS
5.0000 mg | ORAL_TABLET | ORAL | Status: DC | PRN
  Administered 2023-01-19: 5 mg via ORAL

## 2023-01-18 MED ORDER — FAMOTIDINE 20 MG PO TABS
20.0000 mg | ORAL_TABLET | Freq: Once | ORAL | Status: AC
  Administered 2023-01-18: 20 mg via ORAL

## 2023-01-18 MED ORDER — CHLORHEXIDINE GLUCONATE 4 % EX SOLN
60.0000 mL | Freq: Once | CUTANEOUS | Status: AC
  Administered 2023-01-18: 4 via TOPICAL

## 2023-01-18 MED ORDER — PANTOPRAZOLE SODIUM 40 MG PO TBEC
40.0000 mg | DELAYED_RELEASE_TABLET | Freq: Two times a day (BID) | ORAL | Status: DC
  Administered 2023-01-18 – 2023-01-19 (×3): 40 mg via ORAL

## 2023-01-18 MED ORDER — SURGIPHOR WOUND IRRIGATION SYSTEM - OPTIME
TOPICAL | Status: DC | PRN
  Administered 2023-01-18: 450 mL via TOPICAL

## 2023-01-18 MED ORDER — OXYCODONE HCL 5 MG PO TABS
10.0000 mg | ORAL_TABLET | ORAL | Status: DC | PRN
  Administered 2023-01-18 (×2): 10 mg via ORAL

## 2023-01-18 MED ORDER — PROPOFOL 10 MG/ML IV BOLUS
INTRAVENOUS | Status: DC | PRN
  Administered 2023-01-18: 30 mg via INTRAVENOUS

## 2023-01-18 MED ORDER — MAGNESIUM HYDROXIDE 400 MG/5ML PO SUSP
30.0000 mL | Freq: Every day | ORAL | Status: DC
  Administered 2023-01-19: 30 mL via ORAL

## 2023-01-18 MED ORDER — TRANEXAMIC ACID-NACL 1000-0.7 MG/100ML-% IV SOLN
1000.0000 mg | Freq: Once | INTRAVENOUS | Status: AC
  Administered 2023-01-18: 1000 mg via INTRAVENOUS

## 2023-01-18 MED ORDER — OXYCODONE HCL 5 MG PO TABS
ORAL_TABLET | ORAL | Status: AC
  Filled 2023-01-18: qty 2

## 2023-01-18 MED ORDER — OXYCODONE HCL 5 MG/5ML PO SOLN: 5.0000 mg | Freq: Once | ORAL | Status: DC | PRN

## 2023-01-18 MED ORDER — CEFAZOLIN SODIUM-DEXTROSE 2-4 GM/100ML-% IV SOLN
2.0000 g | Freq: Four times a day (QID) | INTRAVENOUS | Status: AC
  Administered 2023-01-18 (×2): 2 g via INTRAVENOUS

## 2023-01-18 MED ORDER — FENTANYL CITRATE (PF) 100 MCG/2ML IJ SOLN
INTRAMUSCULAR | Status: DC | PRN
  Administered 2023-01-18 (×3): 25 ug via INTRAVENOUS

## 2023-01-18 MED ORDER — DIPHENHYDRAMINE HCL 12.5 MG/5ML PO ELIX: 12.5000 mg | ORAL_SOLUTION | ORAL | Status: DC | PRN

## 2023-01-18 MED ORDER — FENTANYL CITRATE (PF) 100 MCG/2ML IJ SOLN
25.0000 ug | INTRAMUSCULAR | Status: DC | PRN
  Administered 2023-01-18 (×2): 50 ug via INTRAVENOUS

## 2023-01-18 MED ORDER — TRANEXAMIC ACID-NACL 1000-0.7 MG/100ML-% IV SOLN
1000.0000 mg | INTRAVENOUS | Status: AC
  Administered 2023-01-18: 1000 mg via INTRAVENOUS

## 2023-01-18 MED ORDER — GABAPENTIN 300 MG PO CAPS
ORAL_CAPSULE | ORAL | Status: AC
  Filled 2023-01-18: qty 1

## 2023-01-18 MED ORDER — DEXAMETHASONE SODIUM PHOSPHATE 10 MG/ML IJ SOLN
8.0000 mg | Freq: Once | INTRAMUSCULAR | Status: AC
  Administered 2023-01-18: 8 mg via INTRAVENOUS

## 2023-01-18 MED ORDER — SODIUM CHLORIDE 0.9 % IR SOLN
Status: DC | PRN
  Administered 2023-01-18: 3000 mL

## 2023-01-18 MED ORDER — ACETAMINOPHEN 10 MG/ML IV SOLN: 1000.0000 mg | Freq: Once | INTRAVENOUS | Status: DC | PRN

## 2023-01-18 MED ORDER — SENNOSIDES-DOCUSATE SODIUM 8.6-50 MG PO TABS
1.0000 | ORAL_TABLET | Freq: Two times a day (BID) | ORAL | Status: DC
  Administered 2023-01-18 – 2023-01-19 (×3): 1 via ORAL

## 2023-01-18 MED ORDER — SODIUM CHLORIDE 0.9 % IV SOLN: INTRAVENOUS | Status: DC

## 2023-01-18 MED ORDER — PHENYLEPHRINE 80 MCG/ML (10ML) SYRINGE FOR IV PUSH (FOR BLOOD PRESSURE SUPPORT)
PREFILLED_SYRINGE | INTRAVENOUS | Status: DC | PRN
  Administered 2023-01-18: 80 ug via INTRAVENOUS
  Administered 2023-01-18 (×2): 160 ug via INTRAVENOUS
  Administered 2023-01-18 (×5): 80 ug via INTRAVENOUS

## 2023-01-18 MED ORDER — ACETAMINOPHEN 10 MG/ML IV SOLN
INTRAVENOUS | Status: DC | PRN
  Administered 2023-01-18: 1000 mg via INTRAVENOUS

## 2023-01-18 MED ORDER — CITALOPRAM HYDROBROMIDE 10 MG PO TABS
10.0000 mg | ORAL_TABLET | Freq: Every day | ORAL | Status: DC
  Administered 2023-01-19: 10 mg via ORAL
  Filled 2023-01-18: qty 1

## 2023-01-18 MED ORDER — METOPROLOL TARTRATE 25 MG PO TABS
25.0000 mg | ORAL_TABLET | Freq: Two times a day (BID) | ORAL | Status: DC
  Administered 2023-01-18 – 2023-01-19 (×2): 25 mg via ORAL
  Filled 2023-01-18: qty 1

## 2023-01-18 MED ORDER — BUPIVACAINE HCL (PF) 0.5 % IJ SOLN
INTRAMUSCULAR | Status: DC | PRN
  Administered 2023-01-18: 3 mL

## 2023-01-18 SURGICAL SUPPLY — 55 items
ACETAB CUP W/GRIPTION 54 (Plate) ×1 IMPLANT
BLADE SAW 90X25X1.19 OSCILLAT (BLADE) ×1 IMPLANT
BRUSH SCRUB EZ PLAIN DRY (MISCELLANEOUS) ×1 IMPLANT
CUP ACETAB W/GRIPTION 54 (Plate) IMPLANT
DRAPE 3/4 80X56 (DRAPES) ×1 IMPLANT
DRAPE INCISE IOBAN 66X60 STRL (DRAPES) ×1 IMPLANT
DRSG AQUACEL AG ADV 3.5X14 (GAUZE/BANDAGES/DRESSINGS) ×1 IMPLANT
DRSG MEPILEX SACRM 8.7X9.8 (GAUZE/BANDAGES/DRESSINGS) ×1 IMPLANT
DRSG NON-ADHERENT DERMACEA 3X4 (GAUZE/BANDAGES/DRESSINGS) ×1 IMPLANT
DRSG TEGADERM 4X4.75 (GAUZE/BANDAGES/DRESSINGS) ×1 IMPLANT
DURAPREP 26ML APPLICATOR (WOUND CARE) ×2 IMPLANT
ELECT CAUTERY BLADE 6.4 (BLADE) ×1 IMPLANT
ELECT REM PT RETURN 9FT ADLT (ELECTROSURGICAL) ×1
ELECTRODE REM PT RTRN 9FT ADLT (ELECTROSURGICAL) ×1 IMPLANT
GLOVE BIOGEL M STRL SZ7.5 (GLOVE) ×4 IMPLANT
GLOVE SRG 8 PF TXTR STRL LF DI (GLOVE) ×2 IMPLANT
GLOVE SURG UNDER POLY LF SZ8 (GLOVE) ×2
GOWN STRL REUS W/ TWL LRG LVL3 (GOWN DISPOSABLE) ×2 IMPLANT
GOWN STRL REUS W/ TWL XL LVL3 (GOWN DISPOSABLE) ×1 IMPLANT
GOWN STRL REUS W/TWL LRG LVL3 (GOWN DISPOSABLE) ×2
GOWN STRL REUS W/TWL XL LVL3 (GOWN DISPOSABLE) ×1
GOWN TOGA ZIPPER T7+ PEEL AWAY (MISCELLANEOUS) ×1 IMPLANT
HANDLE YANKAUER SUCT OPEN TIP (MISCELLANEOUS) ×1 IMPLANT
HEAD M SROM 36MM PLUS 1.5 (Hips) IMPLANT
HEMOVAC 400CC 10FR (MISCELLANEOUS) ×1 IMPLANT
HOLDER FOLEY CATH W/STRAP (MISCELLANEOUS) ×1 IMPLANT
HOOD PEEL AWAY T7 (MISCELLANEOUS) ×1 IMPLANT
IV NS IRRIG 3000ML ARTHROMATIC (IV SOLUTION) ×1 IMPLANT
KIT PEG BOARD PINK (KITS) ×1 IMPLANT
KIT TURNOVER KIT A (KITS) ×1 IMPLANT
LINER MARATHON 10D 36X54 (Hips) IMPLANT
LINER MARATHON 10DEG 36X54 (Hips) ×1 IMPLANT
MANIFOLD NEPTUNE II (INSTRUMENTS) ×2 IMPLANT
NS IRRIG 500ML POUR BTL (IV SOLUTION) ×1 IMPLANT
PACK HIP PROSTHESIS (MISCELLANEOUS) ×1 IMPLANT
PULSAVAC PLUS IRRIG FAN TIP (DISPOSABLE) ×1
SOL PREP PVP 2OZ (MISCELLANEOUS) ×1
SOLUTION IRRIG SURGIPHOR (IV SOLUTION) ×1 IMPLANT
SOLUTION PREP PVP 2OZ (MISCELLANEOUS) ×1 IMPLANT
SPONGE DRAIN TRACH 4X4 STRL 2S (GAUZE/BANDAGES/DRESSINGS) ×1 IMPLANT
SROM M HEAD 36MM PLUS 1.5 (Hips) ×1 IMPLANT
STAPLER SKIN PROX 35W (STAPLE) ×1 IMPLANT
STEM FEM ACTIS HIGH SZ2 (Stem) IMPLANT
SUT ETHIBOND #5 BRAIDED 30INL (SUTURE) ×1 IMPLANT
SUT VIC AB 0 CT1 36 (SUTURE) ×2 IMPLANT
SUT VIC AB 1 CT1 36 (SUTURE) ×2 IMPLANT
SUT VIC AB 2-0 CT1 27 (SUTURE) ×1
SUT VIC AB 2-0 CT1 TAPERPNT 27 (SUTURE) ×1 IMPLANT
TAPE CLOTH 3X10 WHT NS LF (GAUZE/BANDAGES/DRESSINGS) ×1 IMPLANT
TIP FAN IRRIG PULSAVAC PLUS (DISPOSABLE) ×1 IMPLANT
TOWEL OR 17X26 4PK STRL BLUE (TOWEL DISPOSABLE) IMPLANT
TRAP FLUID SMOKE EVACUATOR (MISCELLANEOUS) ×1 IMPLANT
TRAY FOLEY MTR SLVR 16FR STAT (SET/KITS/TRAYS/PACK) ×1 IMPLANT
TUBING CONNECTING 10 (TUBING) ×2 IMPLANT
WATER STERILE IRR 1000ML POUR (IV SOLUTION) ×1 IMPLANT

## 2023-01-18 NOTE — Transfer of Care (Signed)
Immediate Anesthesia Transfer of Care Note  Patient: Ladaysha Masloski Swaminathan  Procedure(s) Performed: TOTAL HIP ARTHROPLASTY (Right: Hip)  Patient Location: PACU  Anesthesia Type:Spinal  Level of Consciousness: awake and alert   Airway & Oxygen Therapy: Patient Spontanous Breathing and Patient connected to face mask oxygen  Post-op Assessment: Report given to RN and Post -op Vital signs reviewed and stable  Post vital signs: Reviewed and stable  Last Vitals:  Vitals Value Taken Time  BP    Temp    Pulse 72 01/18/23 1130  Resp 16 01/18/23 1130  SpO2 99 % 01/18/23 1130  Vitals shown include unvalidated device data.  Last Pain:  Vitals:   01/18/23 0619  TempSrc: Temporal         Complications: No notable events documented.

## 2023-01-18 NOTE — Evaluation (Addendum)
Physical Therapy Evaluation Patient Details Name: Brooke Davis MRN: [id number] DOB: Mar 19, 1941 Today's Date: 01/18/2023  History of Present Illness  Pt is a 82 y/o F who presents s/p R total hip arthroplasty. PMH significant for DM, HTN, anxiety, gout, CAD, HLD, STEMI 12/2020, a-fib  Clinical Impression  Pt is in bed, family present, pt is A&Ox4. Pt reports 3/10 hip pain at rest, worsens to 4/10 with mobility. PTA pt was modI c/ mobility using rollator, able to complete ADL's by self. Pt was able to perform bed mobility CGA, transfers minA c/ RW, ambulation CGA c/ RW. Pt required minA for initial lift and correcting excessive hip flexion. PT educated pt on hip precautions at beginning of session, 2/3 instant recall, 2/3 recall at end of session, nursing notified. Pt left in chair, needs within reach, family in room. Pt would benefit from acute therapy services to improve mobility and functional activity tolerance.       Assistance Recommended at Discharge Intermittent Supervision/Assistance  If plan is discharge home, recommend the following:  Can travel by private vehicle  A little help with walking and/or transfers;A little help with bathing/dressing/bathroom;Assistance with cooking/housework;Assist for transportation;Help with stairs or ramp for entrance;Direct supervision/assist for medications management        Equipment Recommendations Rolling walker (2 wheels)  Recommendations for Other Services       Functional Status Assessment Patient has had a recent decline in their functional status and demonstrates the ability to make significant improvements in function in a reasonable and predictable amount of time.     Precautions / Restrictions Precautions Precautions: Posterior Hip;Fall Precaution Booklet Issued: Yes (comment) Precaution Comments: THA booklet Restrictions Weight Bearing Restrictions: Yes RLE Weight Bearing: Weight bearing as tolerated      Mobility  Bed  Mobility Overal bed mobility: Needs Assistance Bed Mobility: Supine to Sit     Supine to sit: Min guard, HOB elevated     General bed mobility comments: cueing for sequencing and maintaining precautions    Transfers Overall transfer level: Needs assistance Equipment used: Rolling walker (2 wheels) Transfers: Sit to/from Stand Sit to Stand: Min assist           General transfer comment: minA for initial lift and to prevent excessive hip flexion moment    Ambulation/Gait Ambulation/Gait assistance: Min guard Gait Distance (Feet): 50 Feet Assistive device: Rolling walker (2 wheels) Gait Pattern/deviations: Decreased stride length, Decreased step length - right, Decreased step length - left, Step-through pattern          Stairs            Wheelchair Mobility     Tilt Bed    Modified Rankin (Stroke Patients Only)       Balance Overall balance assessment: Needs assistance Sitting-balance support: Feet supported, Single extremity supported Sitting balance-Leahy Scale: Fair     Standing balance support: Bilateral upper extremity supported Standing balance-Leahy Scale: Fair                               Pertinent Vitals/Pain Pain Assessment Pain Assessment: 0-10 Pain Score: 3  Pain Location: R hip Pain Descriptors / Indicators: Aching, Dull, Discomfort Pain Intervention(s): Limited activity within patient's tolerance, Monitored during session, Repositioned    Home Living Family/patient expects to be discharged to:: Private residence Living Arrangements: Other relatives (nephew and his 3 kids live in her house) Available Help at Discharge: Family;Available 24 hours/day Type of  Home: House Home Access: Ramped entrance       Home Layout: One level Home Equipment: Pensions consultant (4 wheels);Wheelchair - Forensic psychologist (2 wheels);Grab bars - toilet;Grab bars - tub/shower;Shower seat      Prior Function Prior Level  of Function : Needs assist             Mobility Comments: modI, uses rollator at baseline ADLs Comments: able to perform ADL's by self, but occassionally requires some assistance     Hand Dominance        Extremity/Trunk Assessment   Upper Extremity Assessment Upper Extremity Assessment: Overall WFL for tasks assessed    Lower Extremity Assessment Lower Extremity Assessment: Overall WFL for tasks assessed;RLE deficits/detail RLE Deficits / Details: s/p R THA       Communication   Communication: No difficulties  Cognition Arousal/Alertness: Awake/alert Behavior During Therapy: WFL for tasks assessed/performed Overall Cognitive Status: Within Functional Limits for tasks assessed                                          General Comments      Exercises Total Joint Exercises Heel Slides: AROM, Both, 5 reps, Supine Long Arc Quad: AROM, Both, 10 reps, Seated   Assessment/Plan    PT Assessment Patient needs continued PT services  PT Problem List Decreased strength;Decreased knowledge of use of DME;Decreased range of motion;Decreased activity tolerance;Decreased safety awareness;Decreased knowledge of precautions;Decreased balance;Pain;Decreased mobility;Decreased coordination       PT Treatment Interventions DME instruction;Neuromuscular re-education;Gait training;Balance training;Stair training;Functional mobility training;Patient/family education;Therapeutic activities;Therapeutic exercise    PT Goals (Current goals can be found in the Care Plan section)  Acute Rehab PT Goals Patient Stated Goal: to return to home PT Goal Formulation: With patient Time For Goal Achievement: 02/01/23 Potential to Achieve Goals: Good    Frequency BID     Co-evaluation               AM-PAC PT "6 Clicks" Mobility  Outcome Measure Help needed turning from your back to your side while in a flat bed without using bedrails?: A Little Help needed moving from  lying on your back to sitting on the side of a flat bed without using bedrails?: A Little Help needed moving to and from a bed to a chair (including a wheelchair)?: A Little Help needed standing up from a chair using your arms (e.g., wheelchair or bedside chair)?: A Little Help needed to walk in hospital room?: A Little Help needed climbing 3-5 steps with a railing? : A Lot 6 Click Score: 17    End of Session Equipment Utilized During Treatment: Gait belt Activity Tolerance: Patient tolerated treatment well Patient left: in chair;with call bell/phone within reach;with chair alarm set;with family/visitor present;with SCD's reapplied Nurse Communication: Mobility status PT Visit Diagnosis: Other abnormalities of gait and mobility (R26.89);Muscle weakness (generalized) (M62.81);Pain Pain - Right/Left: Right Pain - part of body: Hip    Time: 1610-9604 PT Time Calculation (min) (ACUTE ONLY): 28 min   Charges:   PT Evaluation $PT Eval Low Complexity: 1 Low PT Treatments $Therapeutic Activity: 8-22 mins PT General Charges $$ ACUTE PT VISIT: 1 Visit         Lala Lund, PT, SPT  3:56 PM,01/18/23

## 2023-01-18 NOTE — TOC Progression Note (Signed)
Transition of Care [facility]) - Progression Note    Patient Details  Name: [name] MRN: [id number] Date of Birth: 1940/11/11  [facility]) CM/SW Contact  Marlowe Sax, RN Phone Number: 01/18/2023, 3:08 PM  Clinical Narrative:     The patient is set up with [facility] for [facility] prior to surgery  Expected Discharge Plan: Home w Home Health Services Barriers to Discharge: Continued Medical Work up  Expected Discharge Plan and Services                                               Social Determinants of Health (SDOH) Interventions SDOH Screenings   Food Insecurity: No Food Insecurity (01/18/2023)  Housing: Low Risk  (01/18/2023)  Transportation Needs: No Transportation Needs (01/18/2023)  Utilities: Not At Risk (01/18/2023)  Tobacco Use: Medium Risk (01/18/2023)    Readmission Risk Interventions     No data to display

## 2023-01-18 NOTE — Progress Notes (Signed)
Patient awake/alert x4.  Able to move bil lower ext without event. Pulses intact, x3 pillows in place. Spinal worn off, sensation intact, able to push bil lower ext. Medicated for pain as ordered. Family updated. Right hip dressing c/d/I with 91fr hemovac.

## 2023-01-18 NOTE — Op Note (Signed)
OPERATIVE NOTE  DATE OF SURGERY:  01/18/2023  PATIENT NAME:  Brooke Davis   DOB: Nov 27, 1940  MRN: [id number]  PRE-OPERATIVE DIAGNOSIS: Degenerative arthrosis of the right hip, primary  POST-OPERATIVE DIAGNOSIS:  Same  PROCEDURE:  Right total hip arthroplasty  SURGEON:  Jena Gauss. M.D.  ASSISTANT:  Gean Birchwood, PA-C (present and scrubbed throughout the case, critical for assistance with exposure, retraction, instrumentation, and closure)  ANESTHESIA: spinal  ESTIMATED BLOOD LOSS: 75 mL  FLUIDS REPLACED: 1800 mL of crystalloid  DRAINS: 2 medium Hemovac drains  IMPLANTS UTILIZED: DePuy size 2 high offset Actis femoral stem, 54 mm OD Pinnacle GRIPTION Sector acetabular component, +4 mm 10 degree Pinnacle Marathon polyethylene insert, and a 36 mm M-SPEC +1.5 mm hip ball  INDICATIONS FOR SURGERY: Brooke Davis is a 82 y.o. year old female with a long history of progressive hip and groin  pain. X-rays demonstrated severe degenerative changes. The patient had not seen any significant improvement despite conservative nonsurgical intervention. After discussion of the risks and benefits of surgical intervention, the patient expressed understanding of the risks benefits and agree with plans for total hip arthroplasty.   The risks, benefits, and alternatives were discussed at length including but not limited to the risks of infection, bleeding, nerve injury, stiffness, blood clots, the need for revision surgery, limb length inequality, dislocation, cardiopulmonary complications, among others, and they were willing to proceed.  PROCEDURE IN DETAIL: The patient was brought into the operating room and, after adequate spinal anesthesia was achieved, the patient was placed in a left lateral decubitus position. Axillary roll was placed and all bony prominences were well-padded. The patient's right hip was cleaned and prepped with alcohol and DuraPrep and draped in the usual sterile  fashion. A "timeout" was performed as per usual protocol. A lateral curvilinear incision was made gently curving towards the posterior superior iliac spine. The IT band was incised in line with the skin incision and the fibers of the gluteus maximus were split in line. The piriformis tendon was identified, skeletonized, and incised at its insertion to the proximal femur and reflected posteriorly. A T type posterior capsulotomy was performed. Prior to dislocation of the femoral head, a threaded Steinmann pin was inserted through a separate stab incision into the pelvis superior to the acetabulum and bent in the form of a stylus so as to assess limb length and hip offset throughout the procedure. The femoral head was then dislocated posteriorly. Inspection of the femoral head demonstrated severe degenerative changes with full-thickness loss of articular cartilage. The femoral neck cut was performed using an oscillating saw. The anterior capsule was elevated off of the femoral neck using a periosteal elevator. Attention was then directed to the acetabulum. The remnant of the labrum was excised using electrocautery. Inspection of the acetabulum also demonstrated significant degenerative changes. The acetabulum was reamed in sequential fashion up to a 53 mm diameter. Good punctate bleeding bone was encountered. A 54 mm Pinnacle GRIPTION Sector acetabular component was positioned and impacted into place. Good scratch fit was appreciated. A +4 mm neutral polyethylene trial was inserted.  Attention was then directed to the proximal femur.  Femoral broaches were inserted in a sequential fashion up to a size 2 broach. Calcar region was planed and a trial reduction was performed using a standard offset neck and a 36 mm hip ball with a 1.5 mm neck length.  Fair stability was noted.  It was elected to trial with a +4 mm  10 degree offset with the high side positioned at the 8 o'clock position and exchanged the standard offset  neck for a high offset neck.  Good equalization of limb lengths and hip offset was appreciated and excellent stability was noted both anteriorly and posteriorly. Trial components were removed. The acetabular shell was irrigated with copious amounts of normal saline with antibiotic solution and suctioned dry. A +4 mm 10 degree Pinnacle Marathon polyethylene insert was positioned with the high side at the 8 o'clock position and impacted into place. Next, a size 2 high offset Actis femoral stem was positioned and impacted into place. Excellent scratch fit was appreciated. A trial reduction was again performed with a 36 mm hip ball with a +1.5 mm neck length. Again, good equalization of limb lengths was appreciated and excellent stability appreciated both anteriorly and posteriorly. The hip was then dislocated and the trial hip ball was removed. The Morse taper was cleaned and dried. A 36 mm M-SPEC hip ball with a +1.5 mm neck length was placed on the trunnion and impacted into place. The hip was then reduced and placed through range of motion. Excellent stability was appreciated both anteriorly and posteriorly.  The wound was irrigated with copious amounts of normal saline followed by 450 ml of Surgiphor and suctioned dry. Good hemostasis was appreciated. The posterior capsulotomy was repaired using #5 Ethibond. Piriformis tendon was reapproximated to the undersurface of the gluteus medius tendon using #5 Ethibond. The IT band was reapproximated using interrupted sutures of #1 Vicryl. Subcutaneous tissue was approximated using first #0 Vicryl followed by #2-0 Vicryl. The skin was closed with skin staples.  The patient tolerated the procedure well and was transported to the recovery room in stable condition.   Jena Gauss., M.D.

## 2023-01-18 NOTE — Anesthesia Preprocedure Evaluation (Addendum)
Anesthesia Evaluation  Patient identified by MRN, date of birth, ID band Patient awake  General Assessment Comment:  Had some nausea this morning after taking her pre-surgery electrolyte drink, but has since resolved. Never vomited.  Reviewed: Allergy & Precautions, NPO status , Patient's Chart, lab work & pertinent test results  History of Anesthesia Complications (+) PONV and history of anesthetic complications  Airway Mallampati: II  TM Distance: >3 FB Neck ROM: Full    Dental  (+) Teeth Intact   Pulmonary neg pulmonary ROS, neg sleep apnea, neg COPD, Patient abstained from smoking.Not current smoker, former smoker   Pulmonary exam normal breath sounds clear to auscultation       Cardiovascular Exercise Tolerance: Good METShypertension, Pt. on medications + CAD, + Past MI and + Cardiac Stents  (-) dysrhythmias  Rhythm:Irregular Rate:Normal - Systolic murmurs  Patient suffered an inferior wall STEMI on 01/11/2021.  Diagnostic LEFT heart catheterization was performed revealing multivessel CAD; 40% ostial LM, 50% mid LCx, 30% ostial LCx, 30% proximal LAD, 30% mid LAD, 99% proximal RCA, 75% proximal to mid RCA, and 60% distal RCA.  Culprit lesion identified as being the 99% lesion within the proximal RCA.  PCI was subsequently performed placing a 2.75 x 30 mm resolute Onyx DES x 1 to the proximal RCA. Procedure yielded excellent angiographic result and TIMI-3 flow.    TTE performed on 01/12/2021 revealed a normal left ventricular systolic function with an EF of 55-60%.  Left ventricle mildly dilated. Left ventricular diastolic Doppler parameters consistent with abnormal relaxation (G1DD).  Right ventricle was noted to be moderately enlarged with low normal systolic function.  Left atrium was mildly to moderately dilated.  Right atrium was mildly dilated.  There was mild mitral and aortic valve, in addition to moderate tricuspid valve  regurgitation.  All transvalvular gradients were noted to be normal providing no evidence suggestive of valvular stenosis.  Aorta normal in size with no evidence of aneurysmal dilatation.   Deemed by cardiology as "acceptable" risk for this procedure   Neuro/Psych  PSYCHIATRIC DISORDERS Anxiety     negative neurological ROS     GI/Hepatic ,neg GERD  ,,(+)     (-) substance abuse    Endo/Other  diabetes    Renal/GU negative Renal ROS     Musculoskeletal  (+) Arthritis ,    Abdominal   Peds  Hematology   Anesthesia Other Findings Past Medical History: No date: Anxiety     Comment:  a.) on BZO (alprazolam) PRN No date: Arthritis     Comment:  fingers, right hip 11/10/2013: Atrial fibrillation (HCC)     Comment:  a.) CHA2DS2VASc = 5 (age x2, sex, HTN, T2DM);  b.)               rate/rhythm maintained on oral metoprolol tartrate;               chronically anticoagulated with apixaban No date: Bronchitis No date: Cerebrovascular small vessel disease (chronic) 01/11/2021: Coronary artery disease involving native coronary artery  of native heart     Comment:  a.) LHC 01/11/2021: 40% oLM, 50% mLCx, 30% oLCx, 30%               pLAD, 30% mLAD, 99% pRCA (2.75 x 30 mm Resolute Onyx               DES), 75% p-mRCA, 60% dRCA 12/01/2013: DDD (degenerative disc disease), lumbar 01/12/2021: Diastolic dysfunction     Comment:  a.)  TTE 01/12/2021 (s/p inf STEMI): EF 55-60%, mild-mod               LAE, mild RAE, mod RVE, mild AR/MR, mod TR, G1DD No date: Diet-controlled type 2 diabetes mellitus (HCC) No date: Full dentures No date: Gout 2021: History of bilateral cataract extraction No date: Hyperlipidemia No date: Hypertension, essential No date: Long term current use of anticoagulant     Comment:  a.) apixaban No date: PONV (postoperative nausea and vomiting) 11/10/2013: Sinoatrial node dysfunction (HCC) 01/11/2021: ST elevation myocardial infarction (STEMI) of inferior  wall  (HCC)     Comment:  a.) LHC/PCI 01/11/2021 --> culprit lesion 99% pRCA -->               2.75 x 30 mm Resolute Onyx DES x 1 No date: Varicose veins of legs No date: Wears hearing aid in both ears  Reproductive/Obstetrics                             Anesthesia Physical Anesthesia Plan  ASA: 3  Anesthesia Plan: Spinal   Post-op Pain Management: Ofirmev IV (intra-op)* and Gabapentin PO (pre-op)*   Induction: Intravenous  PONV Risk Score and Plan: 3 and Ondansetron, Dexamethasone, Propofol infusion, TIVA and Treatment may vary due to age or medical condition  Airway Management Planned: Natural Airway  Additional Equipment: None  Intra-op Plan:   Post-operative Plan:   Informed Consent: I have reviewed the patients History and Physical, chart, labs and discussed the procedure including the risks, benefits and alternatives for the proposed anesthesia with the patient or authorized representative who has indicated his/her understanding and acceptance.       Plan Discussed with: CRNA and Surgeon  Anesthesia Plan Comments: (Patient's last dose of eliquis was Thursday (4 days ago). BP 180s/90s now in preop. Asymptomatic.  Discussed R/B/A of neuraxial anesthesia technique with patient: - rare risks of spinal/epidural hematoma, nerve damage, infection - Risk of PDPH - Risk of nausea and vomiting - Risk of conversion to general anesthesia and its associated risks, including sore throat, damage to lips/eyes/teeth/oropharynx, and rare risks such as cardiac and respiratory events. - Risk of allergic reactions  Discussed the role of CRNA in patient's perioperative care.  Patient voiced understanding.)       Anesthesia Quick Evaluation

## 2023-01-18 NOTE — Interval H&P Note (Signed)
History and Physical Interval Note:  01/18/2023 6:12 AM  Brooke Davis  has presented today for surgery, with the diagnosis of Primary osteoarthritis of right hip M16.11.  The various methods of treatment have been discussed with the patient and family. After consideration of risks, benefits and other options for treatment, the patient has consented to  Procedure(s): TOTAL HIP ARTHROPLASTY (Right) as a surgical intervention.  The patient's history has been reviewed, patient examined, no change in status, stable for surgery.  I have reviewed the patient's chart and labs.  Questions were answered to the patient's satisfaction.     Adelfo Diebel P Kariya Lavergne

## 2023-01-18 NOTE — Anesthesia Procedure Notes (Signed)
Spinal  Patient location during procedure: OR Reason for block: surgical anesthesia Staffing Performed: anesthesiologist  Anesthesiologist: Corinda Gubler, MD Performed by: Corinda Gubler, MD Authorized by: Corinda Gubler, MD   Preanesthetic Checklist Completed: patient identified, IV checked, site marked, risks and benefits discussed, surgical consent, monitors and equipment checked, pre-op evaluation and timeout performed Spinal Block Patient position: sitting Prep: ChloraPrep and site prepped and draped Patient monitoring: heart rate, continuous pulse ox, blood pressure and cardiac monitor Approach: midline Location: L3-4 Injection technique: single-shot Needle Needle type: Quincke  Needle gauge: 22 G Needle length: 9 cm Assessment Sensory level: T10 Events: CSF return and second provider Additional Notes Unsuccessful attempts by CRNA, followed by successful attempt by MD. Meticulous sterile technique used throughout (CHG prep, sterile gloves, sterile drape). Negative paresthesia. Negative blood return. Positive free-flowing CSF. Expiration date of kit checked and confirmed. Patient tolerated procedure well, without complications.

## 2023-01-19 ENCOUNTER — Encounter: Payer: Self-pay | Admitting: Orthopedic Surgery

## 2023-01-19 DIAGNOSIS — M1611 Unilateral primary osteoarthritis, right hip: Secondary | ICD-10-CM | POA: Diagnosis not present

## 2023-01-19 MED ORDER — MAGNESIUM HYDROXIDE 400 MG/5ML PO SUSP
ORAL | Status: AC
  Filled 2023-01-19: qty 30

## 2023-01-19 MED ORDER — OXYCODONE HCL 5 MG PO TABS: 5.0000 mg | ORAL_TABLET | ORAL | 0 refills | Status: DC | PRN

## 2023-01-19 MED ORDER — SENNOSIDES-DOCUSATE SODIUM 8.6-50 MG PO TABS
ORAL_TABLET | ORAL | Status: AC
  Filled 2023-01-19: qty 1

## 2023-01-19 MED ORDER — METOCLOPRAMIDE HCL 10 MG PO TABS
ORAL_TABLET | ORAL | Status: AC
  Filled 2023-01-19: qty 1

## 2023-01-19 MED ORDER — TRAMADOL HCL 50 MG PO TABS: 50.0000 mg | ORAL_TABLET | ORAL | 0 refills | Status: DC | PRN

## 2023-01-19 MED ORDER — OXYCODONE HCL 5 MG PO TABS
ORAL_TABLET | ORAL | Status: AC
  Filled 2023-01-19: qty 1

## 2023-01-19 MED ORDER — FERROUS SULFATE 325 (65 FE) MG PO TABS
ORAL_TABLET | ORAL | Status: AC
  Filled 2023-01-19: qty 1

## 2023-01-19 MED ORDER — POTASSIUM CHLORIDE CRYS ER 20 MEQ PO TBCR
EXTENDED_RELEASE_TABLET | ORAL | Status: AC
  Filled 2023-01-19: qty 1

## 2023-01-19 MED ORDER — ACETAMINOPHEN 10 MG/ML IV SOLN
INTRAVENOUS | Status: AC
  Filled 2023-01-19: qty 100

## 2023-01-19 MED ORDER — APIXABAN 2.5 MG PO TABS
ORAL_TABLET | ORAL | Status: AC
  Filled 2023-01-19: qty 2

## 2023-01-19 MED ORDER — PANTOPRAZOLE SODIUM 40 MG PO TBEC
DELAYED_RELEASE_TABLET | ORAL | Status: AC
  Filled 2023-01-19: qty 1

## 2023-01-19 MED ORDER — ATORVASTATIN CALCIUM 20 MG PO TABS
ORAL_TABLET | ORAL | Status: AC
  Filled 2023-01-19: qty 4

## 2023-01-19 NOTE — Plan of Care (Signed)
  Problem: Activity: Goal: Ability to avoid complications of mobility impairment will improve Outcome: Progressing   Problem: Pain Management: Goal: Pain level will decrease with appropriate interventions Outcome: Progressing   Problem: Skin Integrity: Goal: Will show signs of wound healing Outcome: Progressing   

## 2023-01-19 NOTE — Discharge Summary (Signed)
Physician Discharge Summary  Subjective: 1 Day Post-Op Procedure(s) (LRB): TOTAL HIP ARTHROPLASTY (Right) Patient reports pain as mild.   Patient seen in rounds with Dr. Ernest Pine. Patient is well, and has had no acute complaints or problems Patient is ready to go home  Physician Discharge Summary  Patient ID: Brooke Davis MRN: [id number] DOB/AGE: 1940/07/26 82 y.o.  Admit date: 01/18/2023 Discharge date: 01/19/2023  Admission Diagnoses:  Discharge Diagnoses:  Principal Problem:   Hx of total hip arthroplasty, right   Discharged Condition: good  Hospital Course: Patient presented to the hospital on 01/18/2023 for an elective right total hip arthroplasty performed by Dr. Ernest Pine.  Patient was given 1 g of TXA and 2 g of Ancef perioperatively.  The patient tolerated surgery well.  See operative details below.  Postoperatively patient did very well.  She was able to pass her PT protocols without difficulty.  She had mild drainage out of her JP drains, was pulled on day 1 postop.  Patient's vital signs are stable and she has passed her PT protocols.  Patient is stable for discharge.  PROCEDURE:  Right total hip arthroplasty   SURGEON:  Jena Gauss. M.D.   ASSISTANT:  Gean Birchwood, PA-C (present and scrubbed throughout the case, critical for assistance with exposure, retraction, instrumentation, and closure)   ANESTHESIA: spinal   ESTIMATED BLOOD LOSS: 75 mL   FLUIDS REPLACED: 1800 mL of crystalloid   DRAINS: 2 medium Hemovac drains   IMPLANTS UTILIZED: DePuy size 2 high offset Actis femoral stem, 54 mm OD Pinnacle GRIPTION Sector acetabular component, +4 mm 10 degree Pinnacle Marathon polyethylene insert, and a 36 mm M-SPEC +1.5 mm hip ball   Treatments: none  Discharge Exam: Blood pressure (!) 148/59, pulse 60, temperature (!) 97.3 F (36.3 C), resp. rate 18, height 5\' 2"  (1.575 m), weight 83.7 kg, SpO2 96 %.   Disposition: Home   Allergies as of 01/19/2023        Reactions   Celebrex [celecoxib] Diarrhea   Penicillins Itching   IgE = 154 (WNL) on 01/05/2023   Relafen [nabumetone]    Palpitation   Lovastatin Diarrhea        Medication List     STOP taking these medications    meloxicam 15 MG tablet Commonly known as: MOBIC       TAKE these medications    allopurinol 300 MG tablet Commonly known as: ZYLOPRIM Take 300 mg by mouth daily.   ALPRAZolam 0.25 MG tablet Commonly known as: XANAX Take 0.25 mg by mouth 2 (two) times daily as needed for anxiety.   apixaban 5 MG Tabs tablet Commonly known as: ELIQUIS Take 5 mg by mouth 2 (two) times daily.   atorvastatin 80 MG tablet Commonly known as: LIPITOR Take 1 tablet (80 mg total) by mouth daily.   citalopram 10 MG tablet Commonly known as: CELEXA Take 10 mg by mouth daily.   Entresto 24-26 MG Generic drug: sacubitril-valsartan Take 1 tablet by mouth 2 (two) times daily.   metoprolol tartrate 25 MG tablet Commonly known as: LOPRESSOR Take 1 tablet (25 mg total) by mouth 2 (two) times daily.   oxyCODONE 5 MG immediate release tablet Commonly known as: Oxy IR/ROXICODONE Take 1 tablet (5 mg total) by mouth every 4 (four) hours as needed for moderate pain (pain score 4-6).   potassium chloride 10 MEQ tablet Commonly known as: KLOR-CON Take 10 mEq by mouth daily.   torsemide 20 MG tablet Commonly known as:  DEMADEX Take 20 mg by mouth daily.   traMADol 50 MG tablet Commonly known as: ULTRAM Take 1-2 tablets (50-100 mg total) by mouth every 4 (four) hours as needed for moderate pain. What changed:  how much to take when to take this               Durable Medical Equipment  (From admission, onward)           Start     Ordered   01/18/23 1255  DME Walker rolling  Once       Question:  Patient needs a walker to treat with the following condition  Answer:  S/P total hip arthroplasty   01/18/23 1255   01/18/23 1255  DME Bedside commode  Once        Comments: Patient is not able to walk the distance required to go the bathroom, or he/she is unable to safely negotiate stairs required to access the bathroom.  A 3in1 BSC will alleviate this problem  Question:  Patient needs a bedside commode to treat with the following condition  Answer:  S/P total hip arthroplasty   01/18/23 1255            Follow-up Information     Hooten, Illene Labrador, MD Follow up on 03/02/2023.   Specialty: Orthopedic Surgery Why: at 2:30pm Contact information: [address] [facility] [place] [address] [phone]                 Signed: Gean Birchwood 01/19/2023, 8:02 AM   Objective: Vital signs in last 24 hours: Temp:  [97.3 F (36.3 C)-99.5 F (37.5 C)] 97.3 F (36.3 C) (07/02 0344) Pulse Rate:  [60-79] 60 (07/02 0345) Resp:  [12-18] 18 (07/02 0345) BP: (100-149)/(51-95) 148/59 (07/02 0345) SpO2:  [93 %-100 %] 96 % (07/02 0345)  Intake/Output from previous day:  Intake/Output Summary (Last 24 hours) at 01/19/2023 0802 Last data filed at 01/19/2023 0344 Gross per 24 hour  Intake 3090.72 ml  Output 885 ml  Net 2205.72 ml    Intake/Output this shift: No intake/output data recorded.  Labs: No results for input(s): "HGB" in the last 72 hours. No results for input(s): "WBC", "RBC", "HCT", "PLT" in the last 72 hours. No results for input(s): "NA", "K", "CL", "CO2", "BUN", "CREATININE", "GLUCOSE", "CALCIUM" in the last 72 hours. No results for input(s): "LABPT", "INR" in the last 72 hours.  EXAM: General - Patient is Alert, Appropriate, and Oriented Extremity - Neurologically intact ABD soft Neurovascular intact Sensation intact distally Intact pulses distally Dorsiflexion/Plantar flexion intact No cellulitis present Compartment soft Dressing - dressing C/D/I and no drainage Motor Function - intact, moving foot and toes well on exam. Patient is able to plantar and dorsi flex with good ROM and strength. Patient has  some difficulty performing SLR. Neurovascularly intact, posterior tibial pulses 2+ JP drain pulled without difficulty. Intact  Assessment/Plan: 1 Day Post-Op Procedure(s) (LRB): TOTAL HIP ARTHROPLASTY (Right) Procedure(s) (LRB): TOTAL HIP ARTHROPLASTY (Right) Past Medical History:  Diagnosis Date   Anxiety    a.) on BZO (alprazolam) PRN   Arthritis    fingers, right hip   Atrial fibrillation (HCC) 11/10/2013   a.) CHA2DS2VASc = 5 (age x2, sex, HTN, T2DM);  b.) rate/rhythm maintained on oral metoprolol tartrate; chronically anticoagulated with apixaban   Bronchitis    Cerebrovascular small vessel disease (chronic)    Coronary artery disease involving native coronary artery of native heart 01/11/2021   a.) LHC 01/11/2021:  40% oLM, 50% mLCx, 30% oLCx, 30% pLAD, 30% mLAD, 99% pRCA (2.75 x 30 mm Resolute Onyx DES), 75% p-mRCA, 60% dRCA   DDD (degenerative disc disease), lumbar 12/01/2013   Diastolic dysfunction 01/12/2021   a.) TTE 01/12/2021 (s/p inf STEMI): EF 55-60%, mild-mod LAE, mild RAE, mod RVE, mild AR/MR, mod TR, G1DD   Diet-controlled type 2 diabetes mellitus (HCC)    Full dentures    Gout    History of bilateral cataract extraction 2021   Hyperlipidemia    Hypertension, essential    Long term current use of anticoagulant    a.) apixaban   PONV (postoperative nausea and vomiting)    Sinoatrial node dysfunction (HCC) 11/10/2013   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) 01/11/2021   a.) LHC/PCI 01/11/2021 --> culprit lesion 99% pRCA --> 2.75 x 30 mm Resolute Onyx DES x 1   Varicose veins of legs    Wears hearing aid in both ears    Principal Problem:   Hx of total hip arthroplasty, right  Estimated body mass index is 33.76 kg/m as calculated from the following:   Height as of this encounter: 5\' 2"  (1.575 m).   Weight as of this encounter: 83.7 kg. Advance diet Up with therapy -patient will continue to work with physical therapy at home.   Discussed hip precautions. Aquacel bandage remains in place for 7 days postoperatively.  Can be changed out to a honeycomb dressing with physical therapy  Patient has allergy to Celebrex, will not be sent home with any Celebrex to take.  However will be sent home with tramadol and oxycodone for as needed pain management.  Patient is taking a daily regimen of Eliquis, which she will continue for DVT prophylaxis  Continue to wear his TED hose on both legs  Urged to ice around the incision site  Patient will have staples removed at the 2-week mark with physical therapy.  Will be cleared to shower after this point.  Will follow-up with [facility]opedics in 6 weeks. Diet - Regular diet Follow up - in 6 weeks Activity - WBAT Disposition - Home Condition Upon Discharge - Good   Danise Edge, PA-C Orthopaedic Surgery 01/19/2023, 8:02 AM

## 2023-01-19 NOTE — Evaluation (Signed)
Occupational Therapy Evaluation Patient Details Name: Brooke Davis MRN: [id number] DOB: 1940/09/03 Today's Date: 01/19/2023   History of Present Illness Pt is a 82 y/o F who presents s/p R total hip arthroplasty. PMH significant for DM, HTN, anxiety, gout, CAD, HLD, STEMI 12/2020, a-fib   Clinical Impression   Patient received for OT evaluation. See flowsheet below for details of function. Generally, patient requiring MOD (I) for bed mobility, deferred functional mobility 2/2 pt having just worked with PT, and set up-MIN A for ADLs (will need to use reacher for LB dressing, and assist for socks/TED hose; family can help). Patient with no further need for OT in acute care; discharge OT services.       Recommendations for follow up therapy are one component of a multi-disciplinary discharge planning process, led by the attending physician.  Recommendations may be updated based on patient status, additional functional criteria and insurance authorization.   Assistance Recommended at Discharge PRN  Patient can return home with the following A little help with bathing/dressing/bathroom;Assistance with cooking/housework;Direct supervision/assist for medications management;Assist for transportation;Help with stairs or ramp for entrance    Functional Status Assessment  Patient has had a recent decline in their functional status and demonstrates the ability to make significant improvements in function in a reasonable and predictable amount of time.  Equipment Recommendations  None recommended by OT    Recommendations for Other Services       Precautions / Restrictions Precautions Precautions: Posterior Hip;Fall Restrictions Weight Bearing Restrictions: No RLE Weight Bearing: Weight bearing as tolerated      Mobility Bed Mobility Overal bed mobility: Modified Independent Bed Mobility: Supine to Sit     Supine to sit: Modified independent (Device/Increase time) (with extra time)           Transfers Overall transfer level: Modified independent Equipment used: Rolling walker (2 wheels) Transfers: Sit to/from Stand Sit to Stand: Modified independent (Device/Increase time)                  Balance Overall balance assessment: Mild deficits observed, not formally tested (Pt using RW if needed when standing. Did not further test mobility, as just worked with PT without issues while using RW.)                                         ADL either performed or assessed with clinical judgement   ADL Overall ADL's : Needs assistance/impaired                                       General ADL Comments: Patient demonstrated ability to don new gown (from home); pt is wearing incontinence underwear currently (per baseline); describes how she uses reacher at home and OT education provided on use of reacher for LB ADLs. Daughter able to assist if needed; pt will need assist with TED hose. Discussed showering and transfer to bed with stool. Pt states she has been transferring to toilet since surgery without issue; discussed use of BSC next to bed at night for increased safety; daughter acknowledges. Pt with good standing balance once up on feet and able to stand statically without use of RW for balance; anticipate set up standing grooming at sink.     Vision Baseline Vision/History: 1 Wears glasses Patient Visual Report:  No change from baseline       Perception     Praxis      Pertinent Vitals/Pain Pain Assessment Pain Assessment: 0-10 Pain Score:  (unrated; low; pt states that her current pain is much improved from her pre-surgical pain) Pain Location: R hip Pain Descriptors / Indicators: Dull, Discomfort, Sore Pain Intervention(s): Limited activity within patient's tolerance     Hand Dominance     Extremity/Trunk Assessment Upper Extremity Assessment Upper Extremity Assessment: Overall WFL for tasks assessed   Lower Extremity  Assessment Lower Extremity Assessment: Defer to PT evaluation;RLE deficits/detail RLE Deficits / Details: s/p R THA   Cervical / Trunk Assessment Cervical / Trunk Assessment: Normal   Communication Communication Communication: No difficulties   Cognition Arousal/Alertness: Awake/alert Behavior During Therapy: WFL for tasks assessed/performed Overall Cognitive Status: Within Functional Limits for tasks assessed                                       General Comments  On room air. Able to name 3/3 movement precautions at R hip; daughter present for second half of session while OT teaching occurring.    Exercises     Shoulder Instructions      Home Living Family/patient expects to be discharged to:: Private residence Living Arrangements: Other relatives (states grandson and his 3 kids) Available Help at Discharge: Family;Available 24 hours/day (also has several daughters that will assist) Type of Home: House Home Access: Ramped entrance     Home Layout: One level     Bathroom Shower/Tub: Chief Strategy Officer: Standard Bathroom Accessibility: Yes   Home Equipment: Pensions consultant (4 wheels);Wheelchair - Forensic psychologist (2 wheels);Grab bars - toilet;Grab bars - tub/shower;Shower seat;Other (comment) (reacher)          Prior Functioning/Environment Prior Level of Function : Needs assist             Mobility Comments: MOD (I) with rollator ADLs Comments: Usually (I) ADLs; intermittent assist such as t/f into bed (high bed with stool) due to pain. Pt states that she was using reacher for LB dressing at baseline 2/2 pain.        OT Problem List:        OT Treatment/Interventions:      OT Goals(Current goals can be found in the care plan section) Acute Rehab OT Goals Patient Stated Goal: Go home OT Goal Formulation: All assessment and education complete, DC therapy  OT Frequency:      Co-evaluation               AM-PAC OT "6 Clicks" Daily Activity     Outcome Measure Help from another person eating meals?: None Help from another person taking care of personal grooming?: None Help from another person toileting, which includes using toliet, bedpan, or urinal?: None Help from another person bathing (including washing, rinsing, drying)?: A Little Help from another person to put on and taking off regular upper body clothing?: None Help from another person to put on and taking off regular lower body clothing?: None 6 Click Score: 23   End of Session Equipment Utilized During Treatment: Rolling walker (2 wheels) Nurse Communication: Mobility status;Other (comment) (pt asking where prescriptions will be sent and RW will be sent)  Activity Tolerance: Patient tolerated treatment well Patient left: with nursing/sitter in room;with family/visitor present (seated EOB; RN about to enter room with  meds)                   Time: 1610-9604 OT Time Calculation (min): 16 min Charges:  OT General Charges $OT Visit: 1 Visit OT Evaluation $OT Eval Low Complexity: 1 Low  Linward Foster, MS, OTR/L  Alvester Morin 01/19/2023, 10:01 AM

## 2023-01-19 NOTE — Anesthesia Postprocedure Evaluation (Signed)
Anesthesia Post Note  Patient: Brooke Davis  Procedure(s) Performed: TOTAL HIP ARTHROPLASTY (Right: Hip)  Patient location during evaluation: PACU Anesthesia Type: Spinal Level of consciousness: oriented and awake and alert Pain management: pain level controlled Vital Signs Assessment: post-procedure vital signs reviewed and stable Respiratory status: spontaneous breathing, respiratory function stable and patient connected to nasal cannula oxygen Cardiovascular status: blood pressure returned to baseline and stable Postop Assessment: no headache, no backache and no apparent nausea or vomiting Anesthetic complications: no   No notable events documented.   Last Vitals:  Vitals:   01/19/23 0344 01/19/23 0345  BP:  (!) 148/59  Pulse:  60  Resp:  18  Temp: (!) 36.3 C   SpO2:  96%    Last Pain:  Vitals:   01/19/23 0702  TempSrc:   PainSc: Asleep                 Karoline Caldwell

## 2023-01-19 NOTE — TOC Progression Note (Signed)
Transition of Care [facility]) - Progression Note    Patient Details  Name: Brooke Davis MRN: [id number] Date of Birth: May 03, 1941  [facility] CM/SW Contact  Marlowe Sax, RN Phone Number: 01/19/2023, 9:18 AM  Clinical Narrative:    Requested a RW to be delivered to room by [employer]   Expected Discharge Plan: Home w Home Health Services Barriers to Discharge: Continued Medical Work up  Expected Discharge Plan and Services         Expected Discharge Date: 01/19/23                                     Social Determinants of Health (SDOH) Interventions SDOH Screenings   Food Insecurity: No Food Insecurity (01/18/2023)  Housing: Low Risk  (01/18/2023)  Transportation Needs: No Transportation Needs (01/18/2023)  Utilities: Not At Risk (01/18/2023)  Tobacco Use: Medium Risk (01/18/2023)    Readmission Risk Interventions     No data to display

## 2023-01-19 NOTE — Progress Notes (Addendum)
Subjective: 1 Day Post-Op Procedure(s) (LRB): TOTAL HIP ARTHROPLASTY (Right) Patient reports pain as mild.   Patient seen in rounds with Dr. Ernest Pine. Patient is well, and has had no acute complaints or problems We will start therapy today.  Plan is to go Home after hospital stay.  Objective: Vital signs in last 24 hours: Temp:  [97.3 F (36.3 C)-99.5 F (37.5 C)] 97.3 F (36.3 C) (07/02 0344) Pulse Rate:  [60-79] 60 (07/02 0345) Resp:  [12-18] 18 (07/02 0345) BP: (100-149)/(51-95) 148/59 (07/02 0345) SpO2:  [93 %-100 %] 96 % (07/02 0345)  Intake/Output from previous day:  Intake/Output Summary (Last 24 hours) at 01/19/2023 0755 Last data filed at 01/19/2023 0344 Gross per 24 hour  Intake 3090.72 ml  Output 885 ml  Net 2205.72 ml    Intake/Output this shift: No intake/output data recorded.  Labs: No results for input(s): "HGB" in the last 72 hours. No results for input(s): "WBC", "RBC", "HCT", "PLT" in the last 72 hours. No results for input(s): "NA", "K", "CL", "CO2", "BUN", "CREATININE", "GLUCOSE", "CALCIUM" in the last 72 hours. No results for input(s): "LABPT", "INR" in the last 72 hours.  EXAM General - Patient is Alert, Appropriate, and Oriented Extremity - Neurologically intact ABD soft Neurovascular intact Sensation intact distally Intact pulses distally Dorsiflexion/Plantar flexion intact No cellulitis present Compartment soft Dressing - dressing C/D/I and no drainage Motor Function - intact, moving foot and toes well on exam. Patient is able to plantar and dorsi flex with good ROM and strength. Patient has some difficulty performing SLR. Neurovascularly intact, posterior tibial pulses 2+ JP drain pulled without difficulty. Intact  Past Medical History:  Diagnosis Date   Anxiety    a.) on BZO (alprazolam) PRN   Arthritis    fingers, right hip   Atrial fibrillation (HCC) 11/10/2013   a.) CHA2DS2VASc = 5 (age x2, sex, HTN, T2DM);  b.) rate/rhythm maintained  on oral metoprolol tartrate; chronically anticoagulated with apixaban   Bronchitis    Cerebrovascular small vessel disease (chronic)    Coronary artery disease involving native coronary artery of native heart 01/11/2021   a.) LHC 01/11/2021: 40% oLM, 50% mLCx, 30% oLCx, 30% pLAD, 30% mLAD, 99% pRCA (2.75 x 30 mm Resolute Onyx DES), 75% p-mRCA, 60% dRCA   DDD (degenerative disc disease), lumbar 12/01/2013   Diastolic dysfunction 01/12/2021   a.) TTE 01/12/2021 (s/p inf STEMI): EF 55-60%, mild-mod LAE, mild RAE, mod RVE, mild AR/MR, mod TR, G1DD   Diet-controlled type 2 diabetes mellitus (HCC)    Full dentures    Gout    History of bilateral cataract extraction 2021   Hyperlipidemia    Hypertension, essential    Long term current use of anticoagulant    a.) apixaban   PONV (postoperative nausea and vomiting)    Sinoatrial node dysfunction (HCC) 11/10/2013   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) 01/11/2021   a.) LHC/PCI 01/11/2021 --> culprit lesion 99% pRCA --> 2.75 x 30 mm Resolute Onyx DES x 1   Varicose veins of legs    Wears hearing aid in both ears     Assessment/Plan: 1 Day Post-Op Procedure(s) (LRB): TOTAL HIP ARTHROPLASTY (Right) Principal Problem:   Hx of total hip arthroplasty, right  Estimated body mass index is 33.76 kg/m as calculated from the following:   Height as of this encounter: 5\' 2"  (1.575 m).   Weight as of this encounter: 83.7 kg. Advance diet Up with therapy -discussed working with physical therapy  and needing to pass her PT protocols before being discharged for home.  Patient takes daily apixaban, continue for DVT prophylaxis.  Patient will also have tramadol and oxycodone for as needed pain control.  Patient has a celebrex allergy.  Continue to utilize ice  DVT Prophylaxis -  apixaban Weight Bearing As Tolerated right leg JP drain pulled Begin Therapy Hip Preacutions discussed  Rayburn Go, PA-C [facility]  Orthopaedics 01/19/2023, 7:55 AM

## 2023-01-19 NOTE — Progress Notes (Addendum)
Physical Therapy Treatment Patient Details Name: Brooke Davis MRN: [id number] DOB: Jan 10, 1941 Today's Date: 01/19/2023   History of Present Illness Pt is a 82 y/o F who presents s/p R total hip arthroplasty. PMH significant for DM, HTN, anxiety, gout, CAD, HLD, STEMI 12/2020, a-fib    PT Comments  Pt in bed, A&Ox4, reports 2/10 pain, worsened to 6/10 with ambulation. Pt performed bed mobility and transfers c/ supervision and RW, ambulating 240ft c/ RW and CGA progressing to supervision. Pt performed 4 steps up/down c/ CGA. Pt left in bed, needs within reach, nursing notified pt requests pain med. Pt would benefit from skilled PT interventions to improve mobility and functional activity tolerance.      Assistance Recommended at Discharge Intermittent Supervision/Assistance  If plan is discharge home, recommend the following:  Can travel by private vehicle    A little help with walking and/or transfers;A little help with bathing/dressing/bathroom;Assistance with cooking/housework;Assist for transportation;Help with stairs or ramp for entrance;Direct supervision/assist for medications management      Equipment Recommendations  Rolling walker (2 wheels)    Recommendations for Other Services       Precautions / Restrictions Precautions Precautions: Posterior Hip;Fall Restrictions Weight Bearing Restrictions: Yes RLE Weight Bearing: Weight bearing as tolerated     Mobility  Bed Mobility Overal bed mobility: Needs Assistance Bed Mobility: Supine to Sit     Supine to sit: Supervision          Transfers Overall transfer level: Needs assistance Equipment used: Rolling walker (2 wheels) Transfers: Sit to/from Stand Sit to Stand: Supervision                Ambulation/Gait Ambulation/Gait assistance: Min guard, Supervision Gait Distance (Feet): 220 Feet Assistive device: Rolling walker (2 wheels) Gait Pattern/deviations: Decreased stride length, Decreased step  length - right, Decreased step length - left, Step-through pattern           Stairs Stairs: Yes Stairs assistance: Min guard Stair Management: Two rails Number of Stairs: 4 General stair comments: CGA and cueing for sequencing   Wheelchair Mobility     Tilt Bed    Modified Rankin (Stroke Patients Only)       Balance Overall balance assessment: Needs assistance Sitting-balance support: Feet supported, No upper extremity supported Sitting balance-Leahy Scale: Good     Standing balance support: Bilateral upper extremity supported Standing balance-Leahy Scale: Fair                              Cognition Arousal/Alertness: Awake/alert Behavior During Therapy: WFL for tasks assessed/performed Overall Cognitive Status: Within Functional Limits for tasks assessed                                          Exercises      General Comments        Pertinent Vitals/Pain Pain Assessment Pain Assessment: 0-10 Pain Score: 2  Pain Location: R hip Pain Descriptors / Indicators: Dull, Discomfort, Sore Pain Intervention(s): Limited activity within patient's tolerance, Monitored during session, Repositioned, Patient requesting pain meds-RN notified    Home Living                          Prior Function            PT Goals (current goals  can now be found in the care plan section) Progress towards PT goals: Progressing toward goals    Frequency    BID      PT Plan Current plan remains appropriate    Co-evaluation              AM-PAC PT "6 Clicks" Mobility   Outcome Measure  Help needed turning from your back to your side while in a flat bed without using bedrails?: A Little Help needed moving from lying on your back to sitting on the side of a flat bed without using bedrails?: A Little Help needed moving to and from a bed to a chair (including a wheelchair)?: A Little Help needed standing up from a chair using  your arms (e.g., wheelchair or bedside chair)?: A Little Help needed to walk in hospital room?: A Little Help needed climbing 3-5 steps with a railing? : A Lot 6 Click Score: 17    End of Session Equipment Utilized During Treatment: Gait belt Activity Tolerance: Other (comment) (was able to complete session, was not limited d/t increased pain) Patient left: in bed;with SCD's reapplied;with call bell/phone within reach Nurse Communication: Mobility status;Patient requests pain meds PT Visit Diagnosis: Other abnormalities of gait and mobility (R26.89);Muscle weakness (generalized) (M62.81);Pain Pain - Right/Left: Right Pain - part of body: Hip     Time: 8469-6295 PT Time Calculation (min) (ACUTE ONLY): 15 min  Charges:    $Therapeutic Activity: 8-22 mins PT General Charges $$ ACUTE PT VISIT: 1 Visit          Lala Lund, PT, SPT  9:23 AM,01/19/23

## 2023-01-19 NOTE — Progress Notes (Signed)
Patient is not able to walk the distance required to go the bathroom, or he/she is unable to safely negotiate stairs required to access the bathroom.  A 3in1 BSC will alleviate this problem   Brooke Davis, Jr. M.D.  

## 2023-01-19 NOTE — Progress Notes (Signed)
DISCHARGE NOTE:  Pt given discharge instructions and scripts. Walker and 2 honeycomb dressings sent with pt. TED hose on both legs. Pt wheeled to car by staff, daughter providing transportation.

## 2023-01-27 ENCOUNTER — Emergency Department: Payer: Medicare Other

## 2023-01-27 ENCOUNTER — Observation Stay: Payer: Medicare Other

## 2023-01-27 ENCOUNTER — Other Ambulatory Visit: Payer: Self-pay

## 2023-01-27 ENCOUNTER — Inpatient Hospital Stay
Admission: EM | Admit: 2023-01-27 | Discharge: 2023-02-02 | DRG: 065 | Disposition: A | Discharge status: Rehabilitation Facility | Payer: Medicare Other | Attending: Internal Medicine | Admitting: Internal Medicine

## 2023-01-27 DIAGNOSIS — K5900 Constipation, unspecified: Secondary | ICD-10-CM | POA: Diagnosis present

## 2023-01-27 DIAGNOSIS — Z96651 Presence of right artificial knee joint: Secondary | ICD-10-CM | POA: Diagnosis present

## 2023-01-27 DIAGNOSIS — Z811 Family history of alcohol abuse and dependence: Secondary | ICD-10-CM

## 2023-01-27 DIAGNOSIS — Z96641 Presence of right artificial hip joint: Secondary | ICD-10-CM | POA: Diagnosis present

## 2023-01-27 DIAGNOSIS — R29898 Other symptoms and signs involving the musculoskeletal system: Secondary | ICD-10-CM | POA: Diagnosis not present

## 2023-01-27 DIAGNOSIS — R29704 NIHSS score 4: Secondary | ICD-10-CM | POA: Diagnosis present

## 2023-01-27 DIAGNOSIS — Z9842 Cataract extraction status, left eye: Secondary | ICD-10-CM

## 2023-01-27 DIAGNOSIS — I5032 Chronic diastolic (congestive) heart failure: Secondary | ICD-10-CM | POA: Diagnosis not present

## 2023-01-27 DIAGNOSIS — R1310 Dysphagia, unspecified: Secondary | ICD-10-CM | POA: Diagnosis present

## 2023-01-27 DIAGNOSIS — Z823 Family history of stroke: Secondary | ICD-10-CM

## 2023-01-27 DIAGNOSIS — Z8249 Family history of ischemic heart disease and other diseases of the circulatory system: Secondary | ICD-10-CM

## 2023-01-27 DIAGNOSIS — I252 Old myocardial infarction: Secondary | ICD-10-CM

## 2023-01-27 DIAGNOSIS — I6349 Cerebral infarction due to embolism of other cerebral artery: Secondary | ICD-10-CM

## 2023-01-27 DIAGNOSIS — F419 Anxiety disorder, unspecified: Secondary | ICD-10-CM | POA: Insufficient documentation

## 2023-01-27 DIAGNOSIS — Z888 Allergy status to other drugs, medicaments and biological substances status: Secondary | ICD-10-CM

## 2023-01-27 DIAGNOSIS — Z880 Allergy status to penicillin: Secondary | ICD-10-CM

## 2023-01-27 DIAGNOSIS — M19042 Primary osteoarthritis, left hand: Secondary | ICD-10-CM | POA: Diagnosis present

## 2023-01-27 DIAGNOSIS — Z974 Presence of external hearing-aid: Secondary | ICD-10-CM

## 2023-01-27 DIAGNOSIS — Z7901 Long term (current) use of anticoagulants: Secondary | ICD-10-CM

## 2023-01-27 DIAGNOSIS — M19041 Primary osteoarthritis, right hand: Secondary | ICD-10-CM | POA: Diagnosis present

## 2023-01-27 DIAGNOSIS — Z87891 Personal history of nicotine dependence: Secondary | ICD-10-CM

## 2023-01-27 DIAGNOSIS — Z6372 Alcoholism and drug addiction in family: Secondary | ICD-10-CM

## 2023-01-27 DIAGNOSIS — G459 Transient cerebral ischemic attack, unspecified: Secondary | ICD-10-CM | POA: Diagnosis not present

## 2023-01-27 DIAGNOSIS — E119 Type 2 diabetes mellitus without complications: Secondary | ICD-10-CM | POA: Diagnosis present

## 2023-01-27 DIAGNOSIS — I6523 Occlusion and stenosis of bilateral carotid arteries: Secondary | ICD-10-CM

## 2023-01-27 DIAGNOSIS — Z833 Family history of diabetes mellitus: Secondary | ICD-10-CM

## 2023-01-27 DIAGNOSIS — R29810 Facial weakness: Secondary | ICD-10-CM | POA: Diagnosis present

## 2023-01-27 DIAGNOSIS — I63412 Cerebral infarction due to embolism of left middle cerebral artery: Principal | ICD-10-CM | POA: Diagnosis present

## 2023-01-27 DIAGNOSIS — Z79899 Other long term (current) drug therapy: Secondary | ICD-10-CM

## 2023-01-27 DIAGNOSIS — Z6835 Body mass index (BMI) 35.0-35.9, adult: Secondary | ICD-10-CM

## 2023-01-27 DIAGNOSIS — Z90711 Acquired absence of uterus with remaining cervical stump: Secondary | ICD-10-CM

## 2023-01-27 DIAGNOSIS — E785 Hyperlipidemia, unspecified: Secondary | ICD-10-CM | POA: Insufficient documentation

## 2023-01-27 DIAGNOSIS — G8321 Monoplegia of upper limb affecting right dominant side: Secondary | ICD-10-CM | POA: Diagnosis present

## 2023-01-27 DIAGNOSIS — I110 Hypertensive heart disease with heart failure: Secondary | ICD-10-CM | POA: Diagnosis present

## 2023-01-27 DIAGNOSIS — I2510 Atherosclerotic heart disease of native coronary artery without angina pectoris: Secondary | ICD-10-CM | POA: Diagnosis present

## 2023-01-27 DIAGNOSIS — M109 Gout, unspecified: Secondary | ICD-10-CM | POA: Insufficient documentation

## 2023-01-27 DIAGNOSIS — I482 Chronic atrial fibrillation, unspecified: Secondary | ICD-10-CM | POA: Diagnosis not present

## 2023-01-27 DIAGNOSIS — M5136 Other intervertebral disc degeneration, lumbar region: Secondary | ICD-10-CM | POA: Diagnosis present

## 2023-01-27 DIAGNOSIS — Z955 Presence of coronary angioplasty implant and graft: Secondary | ICD-10-CM

## 2023-01-27 DIAGNOSIS — Z7982 Long term (current) use of aspirin: Secondary | ICD-10-CM

## 2023-01-27 DIAGNOSIS — Z9841 Cataract extraction status, right eye: Secondary | ICD-10-CM

## 2023-01-27 DIAGNOSIS — F32A Depression, unspecified: Secondary | ICD-10-CM | POA: Diagnosis present

## 2023-01-27 DIAGNOSIS — R4701 Aphasia: Secondary | ICD-10-CM | POA: Diagnosis present

## 2023-01-27 DIAGNOSIS — I639 Cerebral infarction, unspecified: Secondary | ICD-10-CM

## 2023-01-27 DIAGNOSIS — R471 Dysarthria and anarthria: Secondary | ICD-10-CM | POA: Diagnosis present

## 2023-01-27 LAB — CBC
HCT: 28.1 % — ABNORMAL LOW (ref 36.0–46.0)
Hemoglobin: 8.8 g/dL — ABNORMAL LOW (ref 12.0–15.0)
MCH: 31.7 pg (ref 26.0–34.0)
MCHC: 31.3 g/dL (ref 30.0–36.0)
MCV: 101.1 fL — ABNORMAL HIGH (ref 80.0–100.0)
Platelets: 311 10*3/uL (ref 150–400)
RBC: 2.78 MIL/uL — ABNORMAL LOW (ref 3.87–5.11)
RDW: 14.1 % (ref 11.5–15.5)
WBC: 8.3 10*3/uL (ref 4.0–10.5)
nRBC: 0 % (ref 0.0–0.2)

## 2023-01-27 LAB — COMPREHENSIVE METABOLIC PANEL
ALT: 18 U/L (ref 0–44)
AST: 23 U/L (ref 15–41)
Albumin: 2.7 g/dL — ABNORMAL LOW (ref 3.5–5.0)
Alkaline Phosphatase: 98 U/L (ref 38–126)
Anion gap: 9 (ref 5–15)
BUN: 15 mg/dL (ref 8–23)
CO2: 25 mmol/L (ref 22–32)
Calcium: 8.8 mg/dL — ABNORMAL LOW (ref 8.9–10.3)
Chloride: 100 mmol/L (ref 98–111)
Creatinine, Ser: 0.99 mg/dL (ref 0.44–1.00)
GFR, Estimated: 58 mL/min — ABNORMAL LOW (ref >60)
Glucose, Bld: 123 mg/dL — ABNORMAL HIGH (ref 70–99)
Potassium: 4.5 mmol/L (ref 3.5–5.1)
Sodium: 134 mmol/L — ABNORMAL LOW (ref 135–145)
Total Bilirubin: 0.7 mg/dL (ref 0.3–1.2)
Total Protein: 6.3 g/dL — ABNORMAL LOW (ref 6.5–8.1)

## 2023-01-27 LAB — APTT: aPTT: 48 seconds — ABNORMAL HIGH (ref 24–36)

## 2023-01-27 LAB — LIPASE, BLOOD: Lipase: 34 U/L (ref 11–51)

## 2023-01-27 MED ORDER — CITALOPRAM HYDROBROMIDE 20 MG PO TABS
10.0000 mg | ORAL_TABLET | Freq: Every day | ORAL | Status: DC
  Administered 2023-01-28 – 2023-01-31 (×4): 10 mg via ORAL
  Filled 2023-01-27 (×4): qty 1

## 2023-01-27 MED ORDER — LORAZEPAM 2 MG/ML IJ SOLN
0.5000 mg | Freq: Once | INTRAMUSCULAR | Status: AC
  Administered 2023-01-27: 0.5 mg via INTRAVENOUS
  Filled 2023-01-27: qty 1

## 2023-01-27 MED ORDER — METOPROLOL TARTRATE 25 MG PO TABS
25.0000 mg | ORAL_TABLET | Freq: Two times a day (BID) | ORAL | Status: DC
  Administered 2023-01-27 – 2023-02-02 (×12): 25 mg via ORAL
  Filled 2023-01-27 (×12): qty 1

## 2023-01-27 MED ORDER — ONDANSETRON HCL 4 MG/2ML IJ SOLN
4.0000 mg | Freq: Four times a day (QID) | INTRAMUSCULAR | Status: DC | PRN
  Administered 2023-01-29: 4 mg via INTRAVENOUS
  Filled 2023-01-27: qty 2

## 2023-01-27 MED ORDER — OXYCODONE HCL 5 MG PO TABS
5.0000 mg | ORAL_TABLET | ORAL | Status: DC | PRN
  Administered 2023-01-27: 5 mg via ORAL
  Filled 2023-01-27: qty 1

## 2023-01-27 MED ORDER — ACETAMINOPHEN 325 MG PO TABS: 650.0000 mg | ORAL_TABLET | Freq: Four times a day (QID) | ORAL | Status: DC | PRN

## 2023-01-27 MED ORDER — STROKE: EARLY STAGES OF RECOVERY BOOK: Freq: Once | Status: AC

## 2023-01-27 MED ORDER — ALPRAZOLAM 0.25 MG PO TABS
0.2500 mg | ORAL_TABLET | Freq: Two times a day (BID) | ORAL | Status: DC | PRN
  Filled 2023-01-27 (×2): qty 1

## 2023-01-27 MED ORDER — ALLOPURINOL 100 MG PO TABS
300.0000 mg | ORAL_TABLET | Freq: Every day | ORAL | Status: DC
  Administered 2023-01-28 – 2023-02-02 (×6): 300 mg via ORAL
  Filled 2023-01-27 (×6): qty 3

## 2023-01-27 MED ORDER — APIXABAN 5 MG PO TABS
5.0000 mg | ORAL_TABLET | Freq: Two times a day (BID) | ORAL | Status: DC
  Administered 2023-01-27 – 2023-02-02 (×12): 5 mg via ORAL
  Filled 2023-01-27 (×12): qty 1

## 2023-01-27 MED ORDER — SACUBITRIL-VALSARTAN 24-26 MG PO TABS
1.0000 | ORAL_TABLET | Freq: Two times a day (BID) | ORAL | Status: DC
  Administered 2023-01-27 – 2023-01-28 (×2): 1 via ORAL
  Filled 2023-01-27 (×3): qty 1

## 2023-01-27 MED ORDER — TRAZODONE HCL 50 MG PO TABS
25.0000 mg | ORAL_TABLET | Freq: Every evening | ORAL | Status: DC | PRN
  Administered 2023-01-27 – 2023-02-01 (×4): 25 mg via ORAL
  Filled 2023-01-27 (×4): qty 1

## 2023-01-27 MED ORDER — TRAMADOL HCL 50 MG PO TABS
50.0000 mg | ORAL_TABLET | ORAL | Status: DC | PRN
  Administered 2023-01-28: 100 mg via ORAL
  Administered 2023-01-28: 50 mg via ORAL
  Administered 2023-01-31: 100 mg via ORAL
  Administered 2023-02-01 (×2): 50 mg via ORAL
  Filled 2023-01-27 (×2): qty 1
  Filled 2023-01-27: qty 2
  Filled 2023-01-27: qty 1
  Filled 2023-01-27: qty 2

## 2023-01-27 MED ORDER — IOHEXOL 350 MG/ML SOLN
75.0000 mL | Freq: Once | INTRAVENOUS | Status: AC | PRN
  Administered 2023-01-27: 75 mL via INTRAVENOUS

## 2023-01-27 MED ORDER — SODIUM CHLORIDE 0.9 % IV SOLN
INTRAVENOUS | Status: DC
  Administered 2023-01-28: 1000 mL via INTRAVENOUS

## 2023-01-27 MED ORDER — MAGNESIUM HYDROXIDE 400 MG/5ML PO SUSP: 30.0000 mL | Freq: Every day | ORAL | Status: DC | PRN

## 2023-01-27 MED ORDER — ACETAMINOPHEN 650 MG RE SUPP: 650.0000 mg | Freq: Four times a day (QID) | RECTAL | Status: DC | PRN

## 2023-01-27 MED ORDER — ATORVASTATIN CALCIUM 20 MG PO TABS
80.0000 mg | ORAL_TABLET | Freq: Every day | ORAL | Status: DC
  Administered 2023-01-28 – 2023-02-02 (×6): 80 mg via ORAL
  Filled 2023-01-27 (×6): qty 4

## 2023-01-27 MED ORDER — ONDANSETRON HCL 4 MG PO TABS: 4.0000 mg | ORAL_TABLET | Freq: Four times a day (QID) | ORAL | Status: DC | PRN

## 2023-01-27 MED ORDER — FENTANYL CITRATE PF 50 MCG/ML IJ SOSY
50.0000 ug | PREFILLED_SYRINGE | Freq: Once | INTRAMUSCULAR | Status: AC
  Administered 2023-01-27: 50 ug via INTRAVENOUS
  Filled 2023-01-27: qty 1

## 2023-01-27 MED ORDER — TORSEMIDE 20 MG PO TABS
20.0000 mg | ORAL_TABLET | Freq: Every day | ORAL | Status: DC
  Administered 2023-01-28: 20 mg via ORAL
  Filled 2023-01-27: qty 1

## 2023-01-27 MED ORDER — POTASSIUM CHLORIDE CRYS ER 10 MEQ PO TBCR
10.0000 meq | EXTENDED_RELEASE_TABLET | Freq: Every day | ORAL | Status: DC
  Administered 2023-01-28 – 2023-02-02 (×6): 10 meq via ORAL
  Filled 2023-01-27 (×6): qty 1

## 2023-01-27 NOTE — H&P (Addendum)
Plan     [place]   PATIENT NAME: Brooke Davis    MR#:  [id number]  DATE OF BIRTH:  January 17, 1941  DATE OF ADMISSION:  01/27/2023  PRIMARY CARE PHYSICIAN: [name], MD   Patient is coming from: Home  REQUESTING/REFERRING PHYSICIAN: Phineas Semen, MD  CHIEF COMPLAINT:   Chief Complaint  Patient presents with  . right side neglect    HISTORY OF PRESENT ILLNESS:  Brooke Davis is a 82 y.o. female with medical history significant for type 2 diabetes mellitus, hypertension, dyslipidemia, coronary artery disease status post PCI and stent, diastolic dysfunction, and anxiety, who presented to the emergency room with acute onset of altered mental status with confusion after waking up from a nap at 3 PM.  She started her nap at her baseline around 1 PM.  She was having slurred speech and expressive aphasia as well as right arm weakness and neglect.  She just had a right hip surgery on 7/1.  She denies any paresthesias.  No weakness seizures.  No tinnitus or vertigo.  She has been having constipation and low back pain.  She denies any fever or chills.  No chest pain or palpitations.  No cough or wheezing.  No dysuria, oliguria or hematuria or flank pain.  Her symptoms have remarkably improved in the emergency room per her family to close to her baseline.  ED Course: When she came to the ER, BP was 148/72 with otherwise normal vital signs.  Labs revealed sodium 134 and albumin 2.7 with total protein 6.3 and otherwise unremarkable CMP.  CBC showed anemia with hemoglobin 8.8 and hematocrit 28.1 slightly worse than previous levels with macrocytosis EKG as reviewed by me : EKG showed atrial fibrillation with controlled ventricular response of 91 with old anterior infarction. Imaging: Noncontrasted head CT scan showed mild age-related atrophy and moderate chronic microvascular ischemic changes with no acute intracranial pathology.  CTA of the head and neck revealed the following: 1.  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 This is of indeterminate acuity. 2. 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 3. Mild stenosis in the bilateral cavernous and supraclinoid ICAs. 4. Mild stenosis in the proximal right P2 and mid left P2. 5. Aortic atherosclerosis. 6. Emphysema.  Brain MRI without contrast revealed the following: Evaluation is significantly limited by motion, with the exception of the diffusion-weighted sequence. 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  The patient was given 0.5 mg of IV Ativan and 50 mcg of IV fentanyl.  She will be continued on Eliquis and added aspirin.  She will be admitted to a medical telemetry observation bed for further evaluation and management.  PAST MEDICAL HISTORY:   Past Medical History:  Diagnosis Date  . Anxiety    a.) on BZO (alprazolam) PRN  . Arthritis    fingers, right hip  . Atrial fibrillation (HCC) 11/10/2013   a.) CHA2DS2VASc = 5 (age x2, sex, HTN, T2DM);  b.) rate/rhythm maintained on oral metoprolol tartrate; chronically anticoagulated with apixaban  . Bronchitis   . Cerebrovascular small vessel disease (chronic)   . Coronary artery disease involving native coronary artery of native heart 01/11/2021   a.) LHC 01/11/2021: 40% oLM, 50% mLCx, 30% oLCx, 30% pLAD, 30% mLAD, 99% pRCA (2.75 x 30 mm Resolute Onyx DES), 75% p-mRCA, 60% dRCA  .  DDD (degenerative disc disease), lumbar 12/01/2013  . Diastolic dysfunction 01/12/2021   a.) TTE 01/12/2021 (s/p inf STEMI): EF 55-60%, mild-mod LAE, mild RAE, mod RVE, mild AR/MR, mod TR, G1DD  . Diet-controlled type 2 diabetes mellitus (HCC)   . Full dentures   . Gout   . History of bilateral cataract extraction 2021  . Hyperlipidemia   . Hypertension, essential   . Long term current use of  anticoagulant    a.) apixaban  . PONV (postoperative nausea and vomiting)   . Sinoatrial node dysfunction (HCC) 11/10/2013  . 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) 01/11/2021   a.) LHC/PCI 01/11/2021 --> culprit lesion 99% pRCA --> 2.75 x 30 mm Resolute Onyx DES x 1  . Varicose veins of legs   . Wears hearing aid in both ears     PAST SURGICAL HISTORY:   Past Surgical History:  Procedure Laterality Date  . BREAST BIOPSY Left 2011   NEG  . CARDIAC CATHETERIZATION     over 20 yrs ago.  All OK.  Marland Kitchen CATARACT EXTRACTION W/PHACO Left 03/27/2020   Procedure: CATARACT EXTRACTION PHACO AND INTRAOCULAR LENS PLACEMENT (IOC) LEFT 7.49  00:56.4  13.3%;  Surgeon: Lockie Mola, MD;  Location: [facility];  Service: Ophthalmology;  Laterality: Left;  . CATARACT EXTRACTION W/PHACO Right 04/24/2020   Procedure: CATARACT EXTRACTION PHACO AND INTRAOCULAR LENS PLACEMENT (IOC) RIGHT;  Surgeon: Lockie Mola, MD;  Location: [facility];  Service: Ophthalmology;  Laterality: Right;  8.34 0:50.2 16.6%  . CORONARY/GRAFT ACUTE MI REVASCULARIZATION N/A 01/11/2021   Procedure: Coronary/Graft Acute MI Revascularization;  Surgeon: Marcina Millard, MD;  Location: [facility];  Service: Cardiovascular;  Laterality: N/A;  . LEFT HEART CATH AND CORONARY ANGIOGRAPHY N/A 01/11/2021   Procedure: LEFT HEART CATH AND CORONARY ANGIOGRAPHY;  Surgeon: Marcina Millard, MD;  Location: [facility];  Service: Cardiovascular;  Laterality: N/A;  . PARTIAL HYSTERECTOMY    . TOE SURGERY Left    great toe; rod in place  . TOTAL HIP ARTHROPLASTY Right 01/18/2023   Procedure: TOTAL HIP ARTHROPLASTY;  Surgeon: Donato Heinz, MD;  Location: [facility];  Service: Orthopedics;  Laterality: Right;  . TOTAL KNEE ARTHROPLASTY Right 2007    SOCIAL HISTORY:   Social History   Tobacco Use  . Smoking status: Former    Current packs/day: 0.00    Types:  Cigarettes    Quit date: 1980    Years since quitting: 44.5  . Smokeless tobacco: Never  Substance Use Topics  . Alcohol use: Not Currently    FAMILY HISTORY:   Family History  Problem Relation Age of Onset  . Heart attack Mother 41       required open heart surgery. Onset age is unknown.  . Stroke Father   . Diabetes Father   . Alcoholism Brother   . Breast cancer Neg Hx     DRUG ALLERGIES:   Allergies  Allergen Reactions  . Celebrex [Celecoxib] Diarrhea  . Penicillins Itching    IgE = 154 (WNL) on 01/05/2023  . Relafen [Nabumetone]     Palpitation   . Lovastatin Diarrhea    REVIEW OF SYSTEMS:   ROS As per history of present illness. All pertinent systems were reviewed above. Constitutional, HEENT, cardiovascular, respiratory, GI, GU, musculoskeletal, neuro, psychiatric, endocrine, integumentary and hematologic systems were reviewed and are otherwise negative/unremarkable except for positive findings mentioned above in the HPI.   MEDICATIONS AT HOME:  Prior to Admission medications   Medication Sig Start Date End Date Taking? Authorizing Provider  allopurinol (ZYLOPRIM) 300 MG tablet Take 300 mg by mouth daily.    [provider]  ALPRAZolam Prudy Feeler) 0.25 MG tablet Take 0.25 mg by mouth 2 (two) times daily as needed for anxiety.    [provider]  apixaban (ELIQUIS) 5 MG TABS tablet Take 5 mg by mouth 2 (two) times daily.    [provider]  atorvastatin (LIPITOR) 80 MG tablet Take 1 tablet (80 mg total) by mouth daily. 01/14/21   Lurene Shadow, MD  citalopram (CELEXA) 10 MG tablet Take 10 mg by mouth daily.    [provider]  metoprolol tartrate (LOPRESSOR) 25 MG tablet Take 1 tablet (25 mg total) by mouth 2 (two) times daily. 01/13/21   Lurene Shadow, MD  oxyCODONE (OXY IR/ROXICODONE) 5 MG immediate release tablet Take 1 tablet (5 mg total) by mouth every 4 (four) hours as needed for moderate pain (pain score 4-6). 01/19/23    Rayburn Go, PA-C  potassium chloride (KLOR-CON) 10 MEQ tablet Take 10 mEq by mouth daily.    [provider]  sacubitril-valsartan (ENTRESTO) 24-26 MG Take 1 tablet by mouth 2 (two) times daily.    [provider]  torsemide (DEMADEX) 20 MG tablet Take 20 mg by mouth daily.    [provider]  traMADol (ULTRAM) 50 MG tablet Take 1-2 tablets (50-100 mg total) by mouth every 4 (four) hours as needed for moderate pain. 01/19/23   Rayburn Go, PA-C      VITAL SIGNS:  Blood pressure (!) 147/56, pulse 81, temperature 98.6 F (37 C), temperature source Oral, resp. rate 14, height 5\' 2"  (1.575 m), weight 87.6 kg, SpO2 100%.  PHYSICAL EXAMINATION:  Physical Exam  GENERAL:  82 y.o.-year-old Caucasian female patient lying in the bed with no acute distress.  EYES: Pupils equal, round, reactive to light and accommodation. No scleral icterus. Extraocular muscles intact.  HEENT: Head atraumatic, normocephalic. Oropharynx and nasopharynx clear.  NECK:  Supple, no jugular venous distention. No thyroid enlargement, no tenderness.  LUNGS: Normal breath sounds bilaterally, no wheezing, rales,rhonchi or crepitation. No use of accessory muscles of respiration.  CARDIOVASCULAR: Irregularly irregular rhythm, S1, S2 normal. No murmurs, rubs, or gallops.  ABDOMEN: Soft, nondistended, nontender. Bowel sounds present. No organomegaly or mass.  EXTREMITIES: No pedal edema, cyanosis, or clubbing.  NEUROLOGIC: Cranial nerves II through XII are intact. Muscle strength 5/5 in all extremities except for 4/5 in the right upper extremity. Sensation intact. Gait not checked.  PSYCHIATRIC: The patient is alert and oriented x 3.  Normal affect and good eye contact. SKIN: No obvious rash, lesion, or ulcer.   LABORATORY PANEL:   CBC Recent Labs  Lab 01/27/23 1827  WBC 8.3  HGB 8.8*  HCT 28.1*  PLT 311    ------------------------------------------------------------------------------------------------------------------  Chemistries  Recent Labs  Lab 01/27/23 1827  NA 134*  K 4.5  CL 100  CO2 25  GLUCOSE 123*  BUN 15  CREATININE 0.99  CALCIUM 8.8*  AST 23  ALT 18  ALKPHOS 98  BILITOT 0.7   ------------------------------------------------------------------------------------------------------------------  Cardiac Enzymes No results for input(s): "TROPONINI" in the last 168 hours. ------------------------------------------------------------------------------------------------------------------  RADIOLOGY:  MR BRAIN WO CONTRAST  Result Date: 01/28/2023 CLINICAL DATA: Confusion, right upper extremity weakness EXAM: MRI HEAD WITHOUT CONTRAST TECHNIQUE: Multiplanar, multiecho pulse sequences of the brain and surrounding structures were obtained without intravenous contrast. COMPARISON:  No prior MRI available,  correlation is made with 01/27/2023 CT head and CTA head and neck FINDINGS: Evaluation is significantly limited by motion, with the exception of the diffusion-weighted sequence. Brain: Scattered foci of cortical and white matter restricted diffusion with ADC correlates in the left cerebral hemisphere and left caudate (series 5, images 20-38). Evaluation for T2 hyperintense correlate is limited by motion artifact No definite acute hemorrhage, mass, mass effect, or midline shift. No hydrocephalus or extra-axial collection. Vascular: As seen on the prior CTA, the right V4 flow void is not well seen. Otherwise normal arterial flow voids. Skull and upper cervical spine: Grossly normal marrow signal. Sinuses/Orbits: Clear paranasal sinuses. Status post bilateral lens replacements. Other: The mastoid air cells are well aerated. IMPRESSION: Evaluation is significantly limited by motion, with the exception of the diffusion-weighted sequence. Within this limitation, there are scattered foci of  acute infarct in the left cerebral hemisphere and the left caudate. These results were called by telephone at the time of interpretation on 01/28/2023 at 1:05 am to provider [facility] , who verbally acknowledged these results. Electronically Signed   By: Wiliam Ke M.D.   On: 01/28/2023 01:05   CT ANGIO HEAD NECK W WO CM  Result Date: 01/27/2023 CLINICAL DATA:  Confusion and right upper extremity weakness EXAM: CT ANGIOGRAPHY HEAD AND NECK WITH AND WITHOUT CONTRAST TECHNIQUE: Multidetector CT imaging of the head and neck was performed using the standard protocol during bolus administration of intravenous contrast. Multiplanar CT image reconstructions and MIPs were obtained to evaluate the vascular anatomy. Carotid stenosis measurements (when applicable) are obtained utilizing NASCET criteria, using the distal internal carotid diameter as the denominator. RADIATION DOSE REDUCTION: This exam was performed according to the departmental dose-optimization program which includes automated exposure control, adjustment of the mA and/or kV according to patient size and/or use of iterative reconstruction technique. CONTRAST:  75mL OMNIPAQUE IOHEXOL 350 MG/ML SOLN COMPARISON:  No prior CTA available, correlation is made with CT head 01/27/2023 FINDINGS: CT HEAD FINDINGS For noncontrast findings, please see same day CT head. CTA NECK FINDINGS Aortic arch: Evaluation of the arch is somewhat limited by motion. Within this limitation, Two-vessel arch with a common origin of the brachiocephalic and left common carotid arteries. Imaged portion shows no evidence of aneurysm or dissection. No significant stenosis of the major arch vessel origins. Mild aortic atherosclerosis. Right carotid system: 60% stenosis in the proximal right ICA (series 6, image 194). Additional atherosclerotic in the right common carotid artery is not hemodynamically significant. Left carotid system: 70% stenosis in the proximal left ICA (series 6, image  199). Additional atherosclerotic disease in the common carotid artery is not hemodynamically significant. Vertebral arteries: Left dominant system. The left vertebral artery is patent from its origin to the skull base without significant stenosis or evidence of dissection. The right vertebral artery is quite diminutive, and poorly perfused in the distal V3 segment (series 6, image 159), likely reflecting moderate stenosis. Skeleton: No acute osseous abnormality. Degenerative changes in the cervical spine. Other neck: Hypoenhancing and calcified nodules in the thyroid, the largest of which measures up to 6 mm, for which no follow-up is currently indicated. (Reference: J Am Coll Radiol. 2015 Feb;12(2): 143-50) Upper chest: No focal pulmonary opacity or pleural effusion. Mild centrilobular emphysema. Review of the MIP images confirms the above findings CTA HEAD FINDINGS Anterior circulation: Both internal carotid arteries are patent to the termini, with mild stenosis in the bilateral cavernous and supraclinoid segments. A1 segments patent. Normal anterior communicating artery. Anterior  cerebral arteries are patent to their distal aspects without significant stenosis. No M1 stenosis or occlusion. MCA branches are irregular but perfused to their distal aspects without significant stenosis. Posterior circulation: Nonopacification of the proximal right V4 (series 6, image 146), consistent with occlusion. The more distal right V4 is opacified, likely retrograde. The left vertebral artery is patent to the vertebrobasilar junction with mild stenosis in the proximal V4 (series 6, image 141). Posteroinferior cerebellar arteries are not definitively visualized. Basilar patent to its distal aspect without significant stenosis. Superior cerebellar arteries patent proximally. Patent P1 segments. Mild stenosis in the proximal right P2 (series 6, image 99), and mid left P 2 (series 6, image 105). The remainder of the PCAs are  irregular but do not demonstrate significant stenosis. The right posterior communicating artery is patent. The left is not definitively seen. Venous sinuses: As permitted by contrast timing, patent. Anatomic variants: None significant. Review of the MIP images confirms the above findings IMPRESSION: 1. 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 This is of indeterminate acuity. 2. 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 3. Mild stenosis in the bilateral cavernous and supraclinoid ICAs. 4. Mild stenosis in the proximal right P2 and mid left P2. 5. Aortic atherosclerosis. 6. Emphysema. Aortic Atherosclerosis (ICD10-I70.0) and Emphysema (ICD10-J43.9). These results were called by telephone at the time of interpretation on 01/27/2023 at 10:07 pm to provider [facility] , who verbally acknowledged these results. Electronically Signed   By: Wiliam Ke M.D.   On: 01/27/2023 22:07   CT Head Wo Contrast  Result Date: 01/27/2023 CLINICAL DATA:  Confusion. EXAM: CT HEAD WITHOUT CONTRAST TECHNIQUE: Contiguous axial images were obtained from the base of the skull through the vertex without intravenous contrast. RADIATION DOSE REDUCTION: This exam was performed according to the departmental dose-optimization program which includes automated exposure control, adjustment of the mA and/or kV according to patient size and/or use of iterative reconstruction technique. COMPARISON:  Head CT dated 10/03/2021. FINDINGS: Brain: Mild age-related atrophy and moderate chronic microvascular ischemic changes. There is no acute intracranial hemorrhage. No mass effect or midline shift. No extra-axial fluid collection. Vascular: No hyperdense vessel or unexpected calcification. Skull: Normal. Negative for fracture or focal lesion. Sinuses/Orbits: No acute finding. Other: None IMPRESSION: 1. No acute intracranial pathology. 2. Mild age-related  atrophy and moderate chronic microvascular ischemic changes. Electronically Signed   By: Elgie Collard M.D.   On: 01/27/2023 19:14      IMPRESSION AND PLAN:  Assessment and Plan: * Embolic stroke (HCC) - This is likely embolic due to scattered left cerebral hemispheric and left caudate infarctions in the setting of chronic atrial fibrillation. - The patient has subsequent right upper extremity weakness and expressive dysphagia, improving. - The patient will be admitted to an observation medical telemetry bed. - Will place on enteric-coated baby aspirin in addition to Eliquis. - We will continue high-dose statin therapy. - We will check fasting lipids. - We will obtain a 2D echo with bubble study for further assessment for an embolic source. - Neurology consult to be obtained. - I notified Dr. Amada Jupiter about the patient. - PT/OT and ST consults will be obtained.  Chronic atrial fibrillation with RVR (HCC) - We will continue Eliquis and Lopressor.  Chronic diastolic CHF (congestive heart failure) (HCC) - We will continue Demadex, Entresto and Lopressor.  Anxiety and depression - We will continue Xanax and  Celexa.  Gout - We will continue allopurinol  Dyslipidemia - We will continue statin therapy.  Fasting lipids will be checked.       DVT prophylaxis: Eliquis was Advanced Care Planning:  Code Status: full code. Family Communication:  The plan of care was discussed in details with the patient (and family). I answered all questions. The patient agreed to proceed with the above mentioned plan. Further management will depend upon hospital course. Disposition Plan: Back to previous home environment Consults called: Neurology All the records are reviewed and case discussed with ED provider.  Status is: Observation  I certify that at the time of admission, it is my clinical judgment that the patient will require hospital care extending less than 2 midnights.                             Dispo: The patient is from: Home              Anticipated d/c is to: Home              Patient currently is not medically stable to d/c.              Difficult to place patient: No  Hannah Beat M.D on 01/28/2023 at 4:38 AM  [facility]   From 7 PM-7 AM, contact night-coverage [email or web address]  CC: Primary care physician; [name], MD

## 2023-01-27 NOTE — ED Notes (Signed)
Pt slow to answer questions, seems as though searching for words.

## 2023-01-27 NOTE — ED Triage Notes (Signed)
Pt brought to ED via [facility] from Home. Per EMS pt took a nap and on waking had Right sided neglect. EMS reports LVO of 3 on arrival and LVO of 0 on arrival to the ED. Family telling EMS of poss UTI and has had some confusion since hip surgery on 01/18/23.

## 2023-01-27 NOTE — ED Notes (Signed)
Patient transported to CT 

## 2023-01-27 NOTE — Progress Notes (Signed)
Sent a message via EPIC secure chat that patient was in need of baseline NIH assessment & swallow screen.

## 2023-01-27 NOTE — ED Provider Notes (Signed)
[facility] Provider Note    Event Date/Time   First MD Initiated Contact with Patient 01/27/23 1826     (approximate)   History   right side neglect   HPI  Brooke Davis is a 82 y.o. female who presents to the emergency department today after waking up from a nap with confusion and right upper extremity weakness.  Patient went down for her nap at around 1 PM.  Woke up may be around 3 PM.  One of her daughters was there noticed the confusion of the right arm weakness.  The patient herself has a hard time describing what her confusion was like.  Symptoms did start improving quickly and at the time my exam patient is back to baseline. she does not think she ever had anything like this before. Had a normal morning for her before her nap.    Physical Exam   Triage Vital Signs: ED Triage Vitals  Enc Vitals Group     BP 01/27/23 1818 (!) 140/67     Pulse Rate 01/27/23 1818 68     Resp 01/27/23 1818 18     Temp 01/27/23 1818 98.3 F (36.8 C)     Temp Source 01/27/23 1818 Oral     SpO2 01/27/23 1818 100 %     Weight 01/27/23 1819 175 lb (79.4 kg)     Height 01/27/23 1819 5\' 2"  (1.575 m)     Head Circumference --      Peak Flow --      Pain Score 01/27/23 1819 0     Pain Loc --      Pain Edu? --      Excl. in GC? --     Most recent vital signs: Vitals:   01/27/23 1818  BP: (!) 140/67  Pulse: 68  Resp: 18  Temp: 98.3 F (36.8 C)  SpO2: 100%   General: Awake, alert, oriented. CV:  Good peripheral perfusion. Regular rate and rhythm. Resp:  Normal effort. Lungs clear. Abd:  No distention.  Neuro:  Face symmetric. PERRL. EOMI. Strength 5/5 in upper and lower extremities. Sensation grossly intact.    ED Results / Procedures / Treatments   Labs (all labs ordered are listed, but only abnormal results are displayed) Labs Reviewed  COMPREHENSIVE METABOLIC PANEL - Abnormal; Notable for the following components:      Result Value   Sodium 134  (*)    Glucose, Bld 123 (*)    Calcium 8.8 (*)    Total Protein 6.3 (*)    Albumin 2.7 (*)    GFR, Estimated 58 (*)    All other components within normal limits  CBC - Abnormal; Notable for the following components:   RBC 2.78 (*)    Hemoglobin 8.8 (*)    HCT 28.1 (*)    MCV 101.1 (*)    All other components within normal limits  APTT - Abnormal; Notable for the following components:   aPTT 48 (*)    All other components within normal limits  LIPASE, BLOOD     EKG  I, Phineas Semen, attending physician, personally viewed and interpreted this EKG  EKG Time: 1816 Rate: 67 Rhythm: atrial fibrillation Axis: normal Intervals: qtc 436 QRS: narroq, q waves v1, v2, v3 ST changes: no st elevation Impression: abnormal ekg   RADIOLOGY I independently interpreted and visualized the CT head. My interpretation: No bleed. No mass. Radiology interpretation:  IMPRESSION:  1. No acute intracranial pathology.  2. Mild age-related atrophy and moderate chronic microvascular  ischemic changes.      PROCEDURES:  Critical Care performed:  No   MEDICATIONS ORDERED IN ED: Medications - No data to display   IMPRESSION / MDM / ASSESSMENT AND PLAN / ED COURSE  I reviewed the triage vital signs and the nursing notes.                              Differential diagnosis includes, but is not limited to, TIA, electrolyte abnormality  Patient's presentation is most consistent with acute presentation with potential threat to life or bodily function.   The patient is on the cardiac monitor to evaluate for evidence of arrhythmia and/or significant heart rate changes.  Patient presented to the emergency department today after an episode of confusion and right arm weakness.  At the time my exam patient is back to baseline.  CT scan of the head without any acute abnormality.  Blood work without any obvious etiology.  At this time do have concerns for TIA.  Discussed with Dr. Arville Care with  the hospitalist service who will evaluate patient for admission.      FINAL CLINICAL IMPRESSION(S) / ED DIAGNOSES   Final diagnoses:  Right arm weakness    Note:  This document was prepared using Dragon voice recognition software and may include unintentional dictation errors.    Phineas Semen, MD 01/27/23 [phone]

## 2023-01-28 ENCOUNTER — Encounter: Payer: Self-pay | Admitting: Family Medicine

## 2023-01-28 ENCOUNTER — Observation Stay (HOSPITAL_BASED_OUTPATIENT_CLINIC_OR_DEPARTMENT_OTHER)
Admit: 2023-01-28 | Discharge: 2023-01-28 | Disposition: A | Discharge status: Home / Self Care | Payer: Medicare Other | Attending: Family Medicine | Admitting: Family Medicine

## 2023-01-28 ENCOUNTER — Observation Stay: Payer: Medicare Other

## 2023-01-28 DIAGNOSIS — E785 Hyperlipidemia, unspecified: Secondary | ICD-10-CM | POA: Diagnosis present

## 2023-01-28 DIAGNOSIS — M109 Gout, unspecified: Secondary | ICD-10-CM | POA: Diagnosis present

## 2023-01-28 DIAGNOSIS — I6310 Cerebral infarction due to embolism of unspecified precerebral artery: Secondary | ICD-10-CM

## 2023-01-28 DIAGNOSIS — R4701 Aphasia: Secondary | ICD-10-CM | POA: Diagnosis present

## 2023-01-28 DIAGNOSIS — Z823 Family history of stroke: Secondary | ICD-10-CM | POA: Diagnosis not present

## 2023-01-28 DIAGNOSIS — I5032 Chronic diastolic (congestive) heart failure: Secondary | ICD-10-CM

## 2023-01-28 DIAGNOSIS — I6523 Occlusion and stenosis of bilateral carotid arteries: Secondary | ICD-10-CM | POA: Diagnosis not present

## 2023-01-28 DIAGNOSIS — M5136 Other intervertebral disc degeneration, lumbar region: Secondary | ICD-10-CM | POA: Diagnosis present

## 2023-01-28 DIAGNOSIS — I63412 Cerebral infarction due to embolism of left middle cerebral artery: Secondary | ICD-10-CM | POA: Diagnosis present

## 2023-01-28 DIAGNOSIS — K5900 Constipation, unspecified: Secondary | ICD-10-CM | POA: Diagnosis present

## 2023-01-28 DIAGNOSIS — I639 Cerebral infarction, unspecified: Secondary | ICD-10-CM

## 2023-01-28 DIAGNOSIS — Z7189 Other specified counseling: Secondary | ICD-10-CM | POA: Diagnosis not present

## 2023-01-28 DIAGNOSIS — R1310 Dysphagia, unspecified: Secondary | ICD-10-CM | POA: Diagnosis present

## 2023-01-28 DIAGNOSIS — M19042 Primary osteoarthritis, left hand: Secondary | ICD-10-CM | POA: Diagnosis present

## 2023-01-28 DIAGNOSIS — Z79891 Long term (current) use of opiate analgesic: Secondary | ICD-10-CM

## 2023-01-28 DIAGNOSIS — Z96641 Presence of right artificial hip joint: Secondary | ICD-10-CM | POA: Diagnosis present

## 2023-01-28 DIAGNOSIS — F32A Depression, unspecified: Secondary | ICD-10-CM | POA: Insufficient documentation

## 2023-01-28 DIAGNOSIS — I6389 Other cerebral infarction: Secondary | ICD-10-CM | POA: Diagnosis not present

## 2023-01-28 DIAGNOSIS — F419 Anxiety disorder, unspecified: Secondary | ICD-10-CM | POA: Diagnosis present

## 2023-01-28 DIAGNOSIS — Z01810 Encounter for preprocedural cardiovascular examination: Secondary | ICD-10-CM | POA: Diagnosis not present

## 2023-01-28 DIAGNOSIS — G459 Transient cerebral ischemic attack, unspecified: Secondary | ICD-10-CM | POA: Diagnosis present

## 2023-01-28 DIAGNOSIS — I482 Chronic atrial fibrillation, unspecified: Secondary | ICD-10-CM

## 2023-01-28 DIAGNOSIS — Z7901 Long term (current) use of anticoagulants: Secondary | ICD-10-CM | POA: Diagnosis not present

## 2023-01-28 DIAGNOSIS — I4891 Unspecified atrial fibrillation: Secondary | ICD-10-CM | POA: Diagnosis not present

## 2023-01-28 DIAGNOSIS — R29704 NIHSS score 4: Secondary | ICD-10-CM | POA: Diagnosis present

## 2023-01-28 DIAGNOSIS — Z87891 Personal history of nicotine dependence: Secondary | ICD-10-CM

## 2023-01-28 DIAGNOSIS — I110 Hypertensive heart disease with heart failure: Secondary | ICD-10-CM | POA: Diagnosis present

## 2023-01-28 DIAGNOSIS — E119 Type 2 diabetes mellitus without complications: Secondary | ICD-10-CM | POA: Diagnosis present

## 2023-01-28 DIAGNOSIS — R29810 Facial weakness: Secondary | ICD-10-CM | POA: Diagnosis present

## 2023-01-28 DIAGNOSIS — I2510 Atherosclerotic heart disease of native coronary artery without angina pectoris: Secondary | ICD-10-CM | POA: Diagnosis present

## 2023-01-28 DIAGNOSIS — Z888 Allergy status to other drugs, medicaments and biological substances status: Secondary | ICD-10-CM | POA: Diagnosis not present

## 2023-01-28 DIAGNOSIS — R29898 Other symptoms and signs involving the musculoskeletal system: Principal | ICD-10-CM

## 2023-01-28 DIAGNOSIS — Z6835 Body mass index (BMI) 35.0-35.9, adult: Secondary | ICD-10-CM | POA: Diagnosis not present

## 2023-01-28 DIAGNOSIS — G8321 Monoplegia of upper limb affecting right dominant side: Secondary | ICD-10-CM | POA: Diagnosis present

## 2023-01-28 DIAGNOSIS — Z9889 Other specified postprocedural states: Secondary | ICD-10-CM

## 2023-01-28 DIAGNOSIS — R471 Dysarthria and anarthria: Secondary | ICD-10-CM | POA: Diagnosis present

## 2023-01-28 DIAGNOSIS — M19041 Primary osteoarthritis, right hand: Secondary | ICD-10-CM | POA: Diagnosis present

## 2023-01-28 LAB — LIPID PANEL
Cholesterol: 86 mg/dL (ref 0–200)
HDL: 29 mg/dL — ABNORMAL LOW (ref >40)
LDL Cholesterol: 46 mg/dL (ref 0–99)
Total CHOL/HDL Ratio: 3 RATIO
Triglycerides: 55 mg/dL (ref <150)
VLDL: 11 mg/dL (ref 0–40)

## 2023-01-28 LAB — ECHOCARDIOGRAM COMPLETE BUBBLE STUDY
AR max vel: 1.49 cm2
AV Area VTI: 1.66 cm2
AV Area mean vel: 1.57 cm2
AV Mean grad: 8.3 mmHg
AV Peak grad: 15.6 mmHg
Ao pk vel: 1.97 m/s
Area-P 1/2: 4.26 cm2
S' Lateral: 2.5 cm

## 2023-01-28 LAB — BASIC METABOLIC PANEL
Anion gap: 5 (ref 5–15)
BUN: 14 mg/dL (ref 8–23)
CO2: 27 mmol/L (ref 22–32)
Calcium: 8.7 mg/dL — ABNORMAL LOW (ref 8.9–10.3)
Chloride: 104 mmol/L (ref 98–111)
Creatinine, Ser: 1.03 mg/dL — ABNORMAL HIGH (ref 0.44–1.00)
GFR, Estimated: 55 mL/min — ABNORMAL LOW (ref >60)
Glucose, Bld: 124 mg/dL — ABNORMAL HIGH (ref 70–99)
Potassium: 4.5 mmol/L (ref 3.5–5.1)
Sodium: 136 mmol/L (ref 135–145)

## 2023-01-28 LAB — GLUCOSE, CAPILLARY
Glucose-Capillary: 134 mg/dL — ABNORMAL HIGH (ref 70–99)
Glucose-Capillary: 108 mg/dL — ABNORMAL HIGH (ref 70–99)
Glucose-Capillary: 97 mg/dL (ref 70–99)
Glucose-Capillary: 96 mg/dL (ref 70–99)
Glucose-Capillary: 122 mg/dL — ABNORMAL HIGH (ref 70–99)

## 2023-01-28 LAB — CBC
HCT: 27 % — ABNORMAL LOW (ref 36.0–46.0)
Hemoglobin: 8.6 g/dL — ABNORMAL LOW (ref 12.0–15.0)
MCH: 32.2 pg (ref 26.0–34.0)
MCHC: 31.9 g/dL (ref 30.0–36.0)
MCV: 101.1 fL — ABNORMAL HIGH (ref 80.0–100.0)
Platelets: 295 10*3/uL (ref 150–400)
RBC: 2.67 MIL/uL — ABNORMAL LOW (ref 3.87–5.11)
RDW: 14.1 % (ref 11.5–15.5)
WBC: 7.7 10*3/uL (ref 4.0–10.5)
nRBC: 0 % (ref 0.0–0.2)

## 2023-01-28 MED ORDER — ASPIRIN 81 MG PO TBEC
81.0000 mg | DELAYED_RELEASE_TABLET | Freq: Every day | ORAL | Status: DC
  Administered 2023-01-28 – 2023-01-29 (×2): 81 mg via ORAL
  Filled 2023-01-28 (×2): qty 1

## 2023-01-28 MED ORDER — INSULIN ASPART 100 UNIT/ML IJ SOLN: 0.0000 [IU] | Freq: Three times a day (TID) | INTRAMUSCULAR | Status: DC

## 2023-01-28 MED ORDER — CLOPIDOGREL BISULFATE 75 MG PO TABS
75.0000 mg | ORAL_TABLET | Freq: Every day | ORAL | Status: DC
  Administered 2023-01-28 – 2023-01-29 (×2): 75 mg via ORAL
  Filled 2023-01-28 (×2): qty 1

## 2023-01-28 MED ORDER — INSULIN ASPART 100 UNIT/ML IJ SOLN: 0.0000 [IU] | Freq: Every day | INTRAMUSCULAR | Status: DC

## 2023-01-28 MED ORDER — ENSURE ENLIVE PO LIQD
237.0000 mL | Freq: Two times a day (BID) | ORAL | Status: DC
  Administered 2023-01-28 – 2023-02-02 (×7): 237 mL via ORAL

## 2023-01-28 NOTE — Plan of Care (Signed)
  Problem: Education: Goal: Knowledge of the prescribed therapeutic regimen will improve Outcome: Progressing Goal: Understanding of discharge needs will improve Outcome: Progressing Goal: Individualized Educational Video(s) Outcome: Progressing   Problem: Activity: Goal: Ability to avoid complications of mobility impairment will improve Outcome: Progressing Goal: Ability to tolerate increased activity will improve Outcome: Progressing   Problem: Clinical Measurements: Goal: Postoperative complications will be avoided or minimized Outcome: Progressing   Problem: Pain Management: Goal: Pain level will decrease with appropriate interventions Outcome: Progressing   Problem: Skin Integrity: Goal: Will show signs of wound healing Outcome: Progressing   Problem: Education: Goal: Knowledge of disease or condition will improve Outcome: Progressing Goal: Knowledge of secondary prevention will improve (MUST DOCUMENT ALL) Outcome: Progressing Goal: Knowledge of patient specific risk factors will improve [name] N/A or DELETE if not current risk factor) Outcome: Progressing   Problem: Ischemic Stroke/TIA Tissue Perfusion: Goal: Complications of ischemic stroke/TIA will be minimized Outcome: Progressing   Problem: Coping: Goal: Will verbalize positive feelings about self Outcome: Progressing Goal: Will identify appropriate support needs Outcome: Progressing   Problem: Health Behavior/Discharge Planning: Goal: Ability to manage health-related needs will improve Outcome: Progressing Goal: Goals will be collaboratively established with patient/family Outcome: Progressing   Problem: Self-Care: Goal: Ability to participate in self-care as condition permits will improve Outcome: Progressing Goal: Verbalization of feelings and concerns over difficulty with self-care will improve Outcome: Progressing Goal: Ability to communicate needs accurately will improve Outcome: Progressing    Problem: Nutrition: Goal: Risk of aspiration will decrease Outcome: Progressing Goal: Dietary intake will improve Outcome: Progressing   Problem: Education: Goal: Knowledge of General Education information will improve Description: Including pain rating scale, medication(s)/side effects and non-pharmacologic comfort measures Outcome: Progressing   Problem: Health Behavior/Discharge Planning: Goal: Ability to manage health-related needs will improve Outcome: Progressing   Problem: Clinical Measurements: Goal: Ability to maintain clinical measurements within normal limits will improve Outcome: Progressing Goal: Will remain free from infection Outcome: Progressing Goal: Diagnostic test results will improve Outcome: Progressing Goal: Respiratory complications will improve Outcome: Progressing Goal: Cardiovascular complication will be avoided Outcome: Progressing   Problem: Activity: Goal: Risk for activity intolerance will decrease Outcome: Progressing   Problem: Nutrition: Goal: Adequate nutrition will be maintained Outcome: Progressing   Problem: Coping: Goal: Level of anxiety will decrease Outcome: Progressing   Problem: Elimination: Goal: Will not experience complications related to bowel motility Outcome: Progressing Goal: Will not experience complications related to urinary retention Outcome: Progressing   Problem: Pain Managment: Goal: General experience of comfort will improve Outcome: Progressing   Problem: Safety: Goal: Ability to remain free from injury will improve Outcome: Progressing   Problem: Skin Integrity: Goal: Risk for impaired skin integrity will decrease Outcome: Progressing

## 2023-01-28 NOTE — Progress Notes (Signed)
*  PRELIMINARY RESULTS* Echocardiogram 2D Echocardiogram has been performed.  Cristela Blue 01/28/2023, 8:42 AM

## 2023-01-28 NOTE — Assessment & Plan Note (Addendum)
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 - The patient has subsequent right upper extremity weakness and expressive dysphagia, improving. - The patient will be admitted to an observation medical telemetry bed. - Will place on enteric-coated baby aspirin in addition to Eliquis. - We will continue high-dose statin therapy. - We will check fasting lipids. - We will obtain a 2D echo with bubble study for further assessment for an embolic source. - Neurology consult to be obtained. - I notified Dr. Amada Jupiter about the patient. - PT/OT and ST consults will be obtained.

## 2023-01-28 NOTE — Consult Note (Signed)
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  Requesting Physician:  Dr Lolita Patella MD MRN #:  [id number]  History of Present Illness: This is a 82 y.o. female Brooke Davis is a 82 y.o. female with medical history significant for type 2 diabetes mellitus, hypertension, dyslipidemia, coronary artery disease status post PCI and stent, diastolic dysfunction, and anxiety, who presented to the emergency room with acute onset of altered mental status with confusion after waking up from a nap at 3 PM.  She started her nap at her baseline around 1 PM.  She was having slurred speech and expressive aphasia as well as right arm weakness and neglect.  She just had a right hip surgery on 7/1.   Upon workup patient was noted to have on CTA of the head and neck bilateral carotid stenosis. Left side greater than right. Left with 70% or greater occlusion and right 60% or greater occlusion. Also noted to have chronic microvascular ischemic changes. On MRI of the brain there were scattered Foci of Acute Infarct in the left cerebral hemisphere and left caudate.   On exam patient noted to have right sided facial droop with slurred speech. She remains alert and oriented and attempts to follow commands. Noted right sided weakness with +2 strength of the upper extremity and +3 strength of the lower extremity.     Past Medical History:  Diagnosis Date   Anxiety    a.) on BZO (alprazolam) PRN   Arthritis    fingers, right hip   Atrial fibrillation (HCC) 11/10/2013   a.) CHA2DS2VASc = 5 (age x2, sex, HTN, T2DM);  b.) rate/rhythm maintained on oral metoprolol tartrate; chronically anticoagulated with apixaban   Bronchitis    Cerebrovascular small vessel disease (chronic)    Coronary artery disease involving native coronary artery of native heart 01/11/2021   a.) LHC 01/11/2021: 40% oLM, 50% mLCx, 30% oLCx, 30% pLAD, 30% mLAD, 99% pRCA (2.75 x 30 mm  Resolute Onyx DES), 75% p-mRCA, 60% dRCA   DDD (degenerative disc disease), lumbar 12/01/2013   Diastolic dysfunction 01/12/2021   a.) TTE 01/12/2021 (s/p inf STEMI): EF 55-60%, mild-mod LAE, mild RAE, mod RVE, mild AR/MR, mod TR, G1DD   Diet-controlled type 2 diabetes mellitus (HCC)    Full dentures    Gout    History of bilateral cataract extraction 2021   Hyperlipidemia    Hypertension, essential    Long term current use of anticoagulant    a.) apixaban   PONV (postoperative nausea and vomiting)    Sinoatrial node dysfunction (HCC) 11/10/2013   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) 01/11/2021   a.) LHC/PCI 01/11/2021 --> culprit lesion 99% pRCA --> 2.75 x 30 mm Resolute Onyx DES x 1   Varicose veins of legs    Wears hearing aid in both ears     Past Surgical History:  Procedure Laterality Date   BREAST BIOPSY Left 2011   NEG   CARDIAC CATHETERIZATION     over 20 yrs ago.  All OK.   CATARACT EXTRACTION W/PHACO Left 03/27/2020   Procedure: CATARACT EXTRACTION PHACO AND INTRAOCULAR LENS PLACEMENT (IOC) LEFT 7.49  00:56.4  13.3%;  Surgeon: Lockie Mola, MD;  Location: [facility];  Service: Ophthalmology;  Laterality: Left;   CATARACT EXTRACTION W/PHACO Right 04/24/2020   Procedure: CATARACT EXTRACTION PHACO AND INTRAOCULAR LENS PLACEMENT (IOC) RIGHT;  Surgeon: Lockie Mola, MD;  Location: [facility];  Service: Ophthalmology;  Laterality: Right;  8.34 0:50.2 16.6%   CORONARY/GRAFT ACUTE MI REVASCULARIZATION N/A 01/11/2021   Procedure: Coronary/Graft Acute MI Revascularization;  Surgeon: Marcina Millard, MD;  Location: [facility] IN[facility];  Service: Cardiovascular;  Laterality: N/A;   LEFT HEART CATH AND CORONARY ANGIOGRAPHY N/A 01/11/2021   Procedure: LEFT HEART CATH AND CORONARY ANGIOGRAPHY;  Surgeon: Marcina Millard, MD;  Location: [facility];  Service: Cardiovascular;  Laterality: N/A;   PARTIAL  HYSTERECTOMY     TOE SURGERY Left    great toe; rod in place   TOTAL HIP ARTHROPLASTY Right 01/18/2023   Procedure: TOTAL HIP ARTHROPLASTY;  Surgeon: Donato Heinz, MD;  Location: [facility] OR[facility];  Service: Orthopedics;  Laterality: Right;   TOTAL KNEE ARTHROPLASTY Right 2007    Allergies  Allergen Reactions   Celebrex [Celecoxib] Diarrhea   Penicillins Itching    IgE = 154 (WNL) on 01/05/2023   Relafen [Nabumetone]     Palpitation    Lovastatin Diarrhea    Prior to Admission medications   Medication Sig Start Date End Date Taking? Authorizing Provider  allopurinol (ZYLOPRIM) 300 MG tablet Take 300 mg by mouth daily.   Yes [provider]  ALPRAZolam (XANAX) 0.25 MG tablet Take 0.25 mg by mouth 2 (two) times daily as needed for anxiety.   Yes [provider]  apixaban (ELIQUIS) 5 MG TABS tablet Take 5 mg by mouth 2 (two) times daily.   Yes [provider]  atorvastatin (LIPITOR) 80 MG tablet Take 1 tablet (80 mg total) by mouth daily. 01/14/21  Yes Lurene Shadow, MD  citalopram (CELEXA) 10 MG tablet Take 10 mg by mouth daily.   Yes [provider]  metoprolol tartrate (LOPRESSOR) 25 MG tablet Take 1 tablet (25 mg total) by mouth 2 (two) times daily. 01/13/21  Yes Lurene Shadow, MD  ondansetron (ZOFRAN-ODT) 4 MG disintegrating tablet Take 4 mg by mouth every 8 (eight) hours as needed. 01/20/23  Yes [provider]  oxyCODONE (OXY IR/ROXICODONE) 5 MG immediate release tablet Take 1 tablet (5 mg total) by mouth every 4 (four) hours as needed for moderate pain (pain score 4-6). 01/19/23  Yes Rayburn Go, PA-C  potassium chloride (KLOR-CON) 10 MEQ tablet Take 10 mEq by mouth daily.   Yes [provider]  sacubitril-valsartan (ENTRESTO) 24-26 MG Take 1 tablet by mouth 2 (two) times daily.   Yes [provider]  torsemide (DEMADEX) 20 MG tablet Take 20 mg by mouth daily.   Yes [provider]  traMADol (ULTRAM) 50 MG tablet  Take 1-2 tablets (50-100 mg total) by mouth every 4 (four) hours as needed for moderate pain. 01/19/23  Yes Rayburn Go, PA-C    Social History   Socioeconomic History   Marital status: Widowed    Spouse name: Not on file   Number of children: 4   Years of education: Not on file   Highest education level: Not on file  Occupational History   Not on file  Tobacco Use   Smoking status: Former    Current packs/day: 0.00    Types: Cigarettes    Quit date: 1980    Years since quitting: 44.5   Smokeless tobacco: Never  Vaping Use   Vaping status: Never Used  Substance and Sexual Activity   Alcohol use: Not Currently   Drug use: Never   Sexual activity: Not on file  Other Topics Concern   Not on file  Social  History Narrative   Not on file   Social Determinants of Health   Financial Resource Strain: Patient Declined (11/05/2022)   Received from [facility]   Overall Financial Resource Strain (CARDIA)    Difficulty of Paying Living Expenses: Patient declined  Food Insecurity: No Food Insecurity (01/28/2023)   Hunger Vital Sign    Worried About Running Out of Food in the Last Year: Never true    Ran Out of Food in the Last Year: Never true  Transportation Needs: No Transportation Needs (01/28/2023)   PRAPARE - Administrator, Civil Service (Medical): No    Lack of Transportation (Non-Medical): No  Physical Activity: Not on file  Stress: Not on file  Social Connections: Not on file  Intimate Partner Violence: Not At Risk (01/28/2023)   Humiliation, Afraid, Rape, and Kick questionnaire    Fear of Current or Ex-Partner: No    Emotionally Abused: No    Physically Abused: No    Sexually Abused: No     Family History  Problem Relation Age of Onset   Heart attack Mother 64       required open heart surgery. Onset age is unknown.   Stroke Father    Diabetes Father    Alcoholism Brother    Breast cancer Neg Hx     ROS: Otherwise negative  unless mentioned in HPI  Physical Examination  Vitals:   01/28/23 0855 01/28/23 1113  BP: 122/70 129/67  Pulse: 69 70  Resp:  20  Temp:  98.4 F (36.9 C)  SpO2:  100%   Body mass index is 35.32 kg/m.  General:  WDWN in NAD Gait: Not observed HENT: WNL, normocephalic Pulmonary: normal non-labored breathing, without Rales, rhonchi,  wheezing Cardiac: irregular, Hx of Atrial Fibrillation without  Murmurs, rubs or gallops; with carotid bruits Abdomen: Positive Bowel Sounds, soft, NT/ND, no masses Skin: without rashes Vascular Exam/Pulses: Palpable pulses throughout. Extremities: without ischemic changes, without Gangrene , without cellulitis; without open wounds;  Musculoskeletal: no muscle wasting or atrophy  Neurologic: A&O X 3;  No focal weakness or paresthesias are detected; speech is fluent/normal Psychiatric:  The pt has Normal affect. Lymph:  Unremarkable  CBC    Component Value Date/Time   WBC 7.7 01/28/2023 0400   RBC 2.67 (L) 01/28/2023 0400   HGB 8.6 (L) 01/28/2023 0400   HCT 27.0 (L) 01/28/2023 0400   PLT 295 01/28/2023 0400   MCV 101.1 (H) 01/28/2023 0400   MCH 32.2 01/28/2023 0400   MCHC 31.9 01/28/2023 0400   RDW 14.1 01/28/2023 0400   LYMPHSABS 1.1 07/14/2021 1917   MONOABS 0.2 07/14/2021 1917   EOSABS 0.2 07/14/2021 1917   BASOSABS 0.0 07/14/2021 1917    BMET    Component Value Date/Time   NA 136 01/28/2023 0400   K 4.5 01/28/2023 0400   CL 104 01/28/2023 0400   CO2 27 01/28/2023 0400   GLUCOSE 124 (H) 01/28/2023 0400   BUN 14 01/28/2023 0400   CREATININE 1.03 (H) 01/28/2023 0400   CALCIUM 8.7 (L) 01/28/2023 0400   GFRNONAA 55 (L) 01/28/2023 0400    COAGS: Lab Results  Component Value Date   INR 1.4 (H) 07/14/2021   INR 1.0 01/11/2021     Non-Invasive Vascular Imaging:   EXAM:01/27/23 CT ANGIOGRAPHY HEAD AND NECK WITH AND WITHOUT CONTRAST   TECHNIQUE: Multidetector CT imaging of the head and neck was performed using the standard  protocol during bolus administration of intravenous contrast.  Multiplanar CT image reconstructions and MIPs were obtained to evaluate the vascular anatomy. Carotid stenosis measurements (when applicable) are obtained utilizing NASCET criteria, using the distal internal carotid diameter as the denominator.   RADIATION DOSE REDUCTION: This exam was performed according to the departmental dose-optimization program which includes automated exposure control, adjustment of the mA and/or kV according to patient size and/or use of iterative reconstruction technique.   CONTRAST:  75mL OMNIPAQUE IOHEXOL 350 MG/ML SOLN   COMPARISON:  No prior CTA available, correlation is made with CT head 01/27/2023   FINDINGS: CT HEAD FINDINGS   For noncontrast findings, please see same day CT head.   CTA NECK FINDINGS   Aortic arch: Evaluation of the arch is somewhat limited by motion. Within this limitation, Two-vessel arch with a common origin of the brachiocephalic and left common carotid arteries. Imaged portion shows no evidence of aneurysm or dissection. No significant stenosis of the major arch vessel origins. Mild aortic atherosclerosis.   Right carotid system: 60% stenosis in the proximal right ICA (series 6, image 194). Additional atherosclerotic in the right common carotid artery is not hemodynamically significant.   Left carotid system: 70% stenosis in the proximal left ICA (series 6, image 199). Additional atherosclerotic disease in the common carotid artery is not hemodynamically significant.   Vertebral arteries: Left dominant system. The left vertebral artery is patent from its origin to the skull base without significant stenosis or evidence of dissection. The right vertebral artery is quite diminutive, and poorly perfused in the distal V3 segment (series 6, image 159), likely reflecting moderate stenosis.   Skeleton: No acute osseous abnormality. Degenerative changes in  the cervical spine.   Other neck: Hypoenhancing and calcified nodules in the thyroid, the largest of which measures up to 6 mm, for which no follow-up is currently indicated. (Reference: J Am Coll Radiol. 2015 Feb;12(2): 143-50)   Upper chest: No focal pulmonary opacity or pleural effusion. Mild centrilobular emphysema.   Review of the MIP images confirms the above findings   CTA HEAD FINDINGS   Anterior circulation: Both internal carotid arteries are patent to the termini, with mild stenosis in the bilateral cavernous and supraclinoid segments.   A1 segments patent. Normal anterior communicating artery. Anterior cerebral arteries are patent to their distal aspects without significant stenosis.   No M1 stenosis or occlusion. MCA branches are irregular but perfused to their distal aspects without significant stenosis.   Posterior circulation: Nonopacification of the proximal right V4 (series 6, image 146), consistent with occlusion. The more distal right V4 is opacified, likely retrograde. The left vertebral artery is patent to the vertebrobasilar junction with mild stenosis in the proximal V4 (series 6, image 141). Posteroinferior cerebellar arteries are not definitively visualized.   Basilar patent to its distal aspect without significant stenosis. Superior cerebellar arteries patent proximally.   Patent P1 segments. Mild stenosis in the proximal right P2 (series 6, image 99), and mid left P 2 (series 6, image 105). The remainder of the PCAs are irregular but do not demonstrate significant stenosis. The right posterior communicating artery is patent. The left is not definitively seen.   Venous sinuses: As permitted by contrast timing, patent.   Anatomic variants: None significant.   Review of the MIP images confirms the above findings   IMPRESSION: 1. 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 This is of  indeterminate acuity. 2. 70% stenosis in the proximal left ICA and 60% stenosis  in the proximal right ICA. 3. Mild stenosis in the bilateral cavernous and supraclinoid ICAs. 4. Mild stenosis in the proximal right P2 and mid left P2. 5. Aortic atherosclerosis. 6. Emphysema.  EXAM: CT HEAD WITHOUT CONTRAST   TECHNIQUE: Contiguous axial images were obtained from the base of the skull through the vertex without intravenous contrast.   RADIATION DOSE REDUCTION: This exam was performed according to the departmental dose-optimization program which includes automated exposure control, adjustment of the mA and/or kV according to patient size and/or use of iterative reconstruction technique.   COMPARISON:  Head CT dated 10/03/2021.   FINDINGS: Brain: Mild age-related atrophy and moderate chronic microvascular ischemic changes. There is no acute intracranial hemorrhage. No mass effect or midline shift. No extra-axial fluid collection.   Vascular: No hyperdense vessel or unexpected calcification.   Skull: Normal. Negative for fracture or focal lesion.   Sinuses/Orbits: No acute finding.   Other: None   IMPRESSION: 1. No acute intracranial pathology. 2. Mild age-related atrophy and moderate chronic microvascular ischemic changes.    Statin:  Yes.   Beta Blocker:  Yes.   Aspirin:  No. ACEI:  No. ARB:  Yes.   CCB use:  No Other antiplatelets/anticoagulants:  Yes.   Eliquis 5 mg twice daily   ASSESSMENT/PLAN: This is a 82 y.o. female who presents to [facility] ER with altered mental status and CVA with right sided weakness and speech difficulties. Upon work up  she is noted to have bilateral carotid stenosis left greater than right with evolving CVA and acute symptoms. MRI confirms acute infarct.   PLAN: Vascular Surgery plans on bringing the patient to clinic in 2 weeks to evaluate and discuss surgical/procedural options to fix the patients left carotid stenosis. I did discuss the  options with the patient and family members at the bedside today. The patient and family did not wish to make a decision today regarding a plan. I did inform them I would schedule a follow up in clinic in 2 weeks in clinic. They verbalized their understanding. I did ask Dr Georgeann Oppenheim to proceed with obtaining Cardiac Clearance for a future surgery/procedure. She has been seen by Dr Darrold Junker in the past for prior surgical clearance and new onset Afib. I made the family aware.     -I discussed the plan in detail with Dr Festus Barren MD and he is in agreement with the plan.    Marcie Bal [facility] 01/28/2023 12:16 PM

## 2023-01-28 NOTE — Evaluation (Addendum)
Speech Language Pathology Evaluation Patient Details Name: Brooke Davis MRN: [id number] DOB: 02-11-1941 Today's Date: 01/28/2023 Time: 4098-1191 SLP Time Calculation (min) (ACUTE ONLY): 25 min  Problem List:  Patient Active Problem List   Diagnosis Date Noted   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   Chronic atrial fibrillation with RVR (HCC) 01/28/2023   Dyslipidemia 01/28/2023   Chronic diastolic CHF (congestive heart failure) (HCC) 01/28/2023   Gout 01/28/2023   Anxiety and depression 01/28/2023   Right arm weakness 01/28/2023   Hx of total hip arthroplasty, right 01/18/2023   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   Slurred speech 09/23/2021   STEMI involving oth coronary artery of inferior wall (HCC) 01/11/2021   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) 01/11/2021   Controlled type 2 diabetes mellitus with hyperglycemia, without long-term current use of insulin (HCC) 01/11/2021   Pure hypercholesterolemia 01/11/2021   Hypercalcemia 01/11/2021   DDD (degenerative disc disease), lumbar 12/01/2013   Lumbar radiculitis 12/01/2013   Atrial fibrillation, transient (HCC) 11/10/2013   Sinoatrial node dysfunction (HCC) 11/10/2013   Primary hypertension 11/10/2013   Poorly-controlled hypertension 11/10/2013   Past Medical History:  Past Medical History:  Diagnosis Date   Anxiety    a.) on BZO (alprazolam) PRN   Arthritis    fingers, right hip   Atrial fibrillation (HCC) 11/10/2013   a.) CHA2DS2VASc = 5 (age x2, sex, HTN, T2DM);  b.) rate/rhythm maintained on oral metoprolol tartrate; chronically anticoagulated with apixaban   Bronchitis    Cerebrovascular small vessel disease (chronic)    Coronary artery disease involving native coronary artery of native heart 01/11/2021   a.) LHC 01/11/2021: 40% oLM, 50% mLCx, 30% oLCx, 30% pLAD, 30% mLAD, 99% pRCA (2.75 x 30 mm Resolute Onyx DES), 75% p-mRCA, 60% dRCA   DDD  (degenerative disc disease), lumbar 12/01/2013   Diastolic dysfunction 01/12/2021   a.) TTE 01/12/2021 (s/p inf STEMI): EF 55-60%, mild-mod LAE, mild RAE, mod RVE, mild AR/MR, mod TR, G1DD   Diet-controlled type 2 diabetes mellitus (HCC)    Full dentures    Gout    History of bilateral cataract extraction 2021   Hyperlipidemia    Hypertension, essential    Long term current use of anticoagulant    a.) apixaban   PONV (postoperative nausea and vomiting)    Sinoatrial node dysfunction (HCC) 11/10/2013   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) 01/11/2021   a.) LHC/PCI 01/11/2021 --> culprit lesion 99% pRCA --> 2.75 x 30 mm Resolute Onyx DES x 1   Varicose veins of legs    Wears hearing aid in both ears    Past Surgical History:  Past Surgical History:  Procedure Laterality Date   BREAST BIOPSY Left 2011   NEG   CARDIAC CATHETERIZATION     over 20 yrs ago.  All OK.   CATARACT EXTRACTION W/PHACO Left 03/27/2020   Procedure: CATARACT EXTRACTION PHACO AND INTRAOCULAR LENS PLACEMENT (IOC) LEFT 7.49  00:56.4  13.3%;  Surgeon: Lockie Mola, MD;  Location: [facility];  Service: Ophthalmology;  Laterality: Left;   CATARACT EXTRACTION W/PHACO Right 04/24/2020   Procedure: CATARACT EXTRACTION PHACO AND INTRAOCULAR LENS PLACEMENT (IOC) RIGHT;  Surgeon: Lockie Mola, MD;  Location: [facility];  Service: Ophthalmology;  Laterality: Right;  8.34 0:50.2 16.6%   CORONARY/GRAFT ACUTE MI REVASCULARIZATION N/A 01/11/2021   Procedure: Coronary/Graft Acute MI Revascularization;  Surgeon: Marcina Millard, MD;  Location: [facility];  Service: Cardiovascular;  Laterality: N/A;   LEFT HEART CATH AND CORONARY ANGIOGRAPHY N/A 01/11/2021   Procedure: LEFT HEART CATH AND CORONARY ANGIOGRAPHY;  Surgeon: Marcina Millard, MD;  Location: [facility];  Service: Cardiovascular;  Laterality: N/A;   PARTIAL HYSTERECTOMY     TOE  SURGERY Left    great toe; rod in place   TOTAL HIP ARTHROPLASTY Right 01/18/2023   Procedure: TOTAL HIP ARTHROPLASTY;  Surgeon: Donato Heinz, MD;  Location: [facility];  Service: Orthopedics;  Laterality: Right;   TOTAL KNEE ARTHROPLASTY Right 2007   HPI:  Per MD progress note, "82 y.o. female with medical history significant for type 2 diabetes mellitus, hypertension, dyslipidemia, coronary artery disease status post PCI and stent, diastolic dysfunction, and anxiety, who presented to the emergency room with acute onset of altered mental status with confusion after waking up from a nap at 3 PM.  She started her nap at her baseline around 1 PM.  She was having slurred speech and expressive aphasia as well as right arm weakness and neglect.  She just had a right hip surgery on 7/1... Symptoms had initially improved the emergency room however patient had a deterioration in neurologic function early a.m. on 7/11. Repeat head CT without significant interval change." MRI 01/27/23: "Evaluation is significantly limited by motion, with the exception of the diffusion-weighted sequence. Within this limitation, there are scattered foci of acute infarct in the left cerebral hemisphere and the left caudate." Head CT 01/28/23: "Stable noncontrast CT appearance of the brain since yesterday, the  multiple small infarcts by MRI are largely occult by CT. No new intracranial abnormality."   Assessment / Plan / Recommendation Clinical Impression  Cognitive linguistic assessment completion in the setting of acute CVA. Assessment consisted of dynamic assessment and pt/family interview. Pt presents with expressive and receptive language deficits. Expressive language notable for delayed response and intermittent semantic paraphasia vs neologism. Verbal expression primarily limited to short phrase level, suspect impacted by current level of fatigue vs verbal fluency. 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 Receptive language with relative strength for one step and simple prompts, with increased challenge noted for semi-complex prompts and 2 step commands. Suspect baseline hearing status negatively impacts auditory comprehension. Repetition, visual cues, and increased volume noted to aid comprehension. Cognitive assessment limited, secondary to focus on language and current level of fatigue. Pt oriented to person and place. Visual processing for use of orientation aid bolstered with use visual cues and glasses. Sustained attention grossly intact t/o session. Family present for assessment and reported noted challenge with "getting words out." Further assessment is warranted for cognitive linguistic ability.    SLP Assessment  SLP Recommendation/Assessment: Patient needs continued Speech Lanaguage Pathology Services SLP Visit Diagnosis: Aphasia (R47.01);Cognitive communication deficit (R41.841);Apraxia (R48.2)    Recommendations for follow up therapy are one component of a multi-disciplinary discharge planning process, led by the attending physician.  Recommendations may be updated based on patient status, additional functional criteria and insurance authorization.    Follow Up Recommendations  Follow physician's recommendations for discharge plan and follow up therapies    Assistance Recommended at Discharge  Intermittent Supervision/Assistance  Functional Status Assessment Patient has had a recent decline in their functional status and demonstrates the ability to make significant improvements in function in a reasonable and predictable amount of time.  Frequency and Duration min 2x/week  2 weeks      SLP Evaluation Cognition  Overall Cognitive Status: Impaired/Different  from baseline Arousal/Alertness: Awake/alert Orientation Level: Oriented to person;Oriented to place;Disoriented to time;Disoriented to situation Year: 2024 Month: July Day of Week: Incorrect Attention:  Focused Focused Attention: Appears intact Memory:  (will be further assessed) Awareness: Impaired Awareness Impairment: Intellectual impairment Problem Solving:  (will be further assessed) Executive Function:  (will be further assessed) Behaviors:  (none) Safety/Judgment:  (will be further assessed)       Comprehension  Auditory Comprehension Overall Auditory Comprehension: Impaired Yes/No Questions: Within Functional Limits (yes no (basic, contextual) 7/7) Commands: Impaired (100% acc for one step commands) Two Step Basic Commands: 50-74% accurate Conversation: Simple Other Conversation Comments: suspect increased challenge for complex Interfering Components: Processing speed;Hearing (lethargy) EffectiveTechniques: Extra processing time;Pausing;Repetition;Slowed speech;Stressing words;Increased volume Visual Recognition/Discrimination Discrimination:  (will be further assessed- wears glasses at baseline.) Reading Comprehension Reading Status: Not tested    Expression Expression Primary Mode of Expression: Verbal Verbal Expression Overall Verbal Expression: Impaired Initiation: No impairment Automatic Speech: Counting Level of Generative/Spontaneous Verbalization: Conversation Repetition:  (will be further assessed) Naming: Impairment Responsive: 76-100% accurate Confrontation: Impaired Convergent: 50-74% accurate (Items in room 3/5; family in room 2/4) Divergent: Not tested Verbal Errors: Neologisms;Phonemic paraphasias Pragmatics: Impairment Impairments: Abnormal affect Effective Techniques: Open ended questions;Other (Comment) (binary options) Non-Verbal Means of Communication:  (will be further assessed) Written Expression Dominant Hand: Left Written Expression: Not tested   Oral / Motor  Oral Motor/Sensory Function Overall Oral Motor/Sensory Function: Mild impairment Facial ROM: Within Functional Limits Facial Symmetry: Abnormal symmetry right Lingual ROM:  Within Functional Limits Lingual Symmetry: Within Functional Limits Velum: Within Functional Limits Mandible: Within Functional Limits Motor Speech Overall Motor Speech: Impaired Respiration: Within functional limits Phonation: Normal Resonance: Within functional limits Articulation: Within functional limitis Intelligibility: Intelligible Motor Planning: Impaired Level of Impairment: Phrase Motor Speech Errors: Inconsistent Interfering Components: Hearing loss Effective Techniques: Slow rate;Pause           [place] Devinne Epstein Clapp  MS [facility] SLP   [place] J Clapp 01/28/2023, 2:20 PM

## 2023-01-28 NOTE — TOC Progression Note (Signed)
Transition of Care [facility]) - Progression Note    Patient Details  Name: Brooke Davis MRN: [id number] Date of Birth: [birth year]  Transition of Care [facility]) CM/SW Contact  Allena Katz, LCSW Phone Number: 01/28/2023, 4:12 PM  Clinical Narrative:   Pt triggered level 2 passr. CSW to upload 30 day note and fl2.         Expected Discharge Plan and Services                                               Social Determinants of Health (SDOH) Interventions SDOH Screenings   Food Insecurity: No Food Insecurity (01/28/2023)  Housing: Low Risk  (01/28/2023)  Transportation Needs: No Transportation Needs (01/28/2023)  Utilities: Not At Risk (01/28/2023)  Financial Resource Strain: Patient Declined (11/05/2022)   Received from [facility]  Tobacco Use: Medium Risk (01/28/2023)    Readmission Risk Interventions     No data to display

## 2023-01-28 NOTE — Evaluation (Addendum)
Occupational Therapy Evaluation Patient Details Name: Brooke Davis MRN: [id number] DOB: [birth year] Today's Date: 01/28/2023   History of Present Illness Pt is a 82 year old female presenting to the ED with AMS and confusion, admitted with left cerebral hemispheric and left caudate infarctions in the setting of chronic atrial fibrillation; PMH significant for type 2 diabetes mellitus, hypertension, dyslipidemia, coronary artery disease status post PCI and stent, diastolic dysfunction, and anxiety. Of note, pt had R THA 01/18/23   Clinical Impression   Chart reviewed, pt greeted in bed with daughters present. Pt is lethargic, oriented to self and month/year. She states she is at church when provided 3 options. ?R sided inattention noted throughout evaluation. Pt has no recall of hip precautions. PTA pt was recovering from hip surgey but prior to surgery was generally MOD I in ADL, assit for LB dressing, assist for IADLs. Pt presents with deficits in strength, endurance, activity tolerance, RUE function, balance, cognition all affecting safe and optimal ADL completion. Recommend post acute OT to address deficits and to facilitate return to PLOF.      Recommendations for follow up therapy are one component of a multi-disciplinary discharge planning process, led by the attending physician.  Recommendations may be updated based on patient status, additional functional criteria and insurance authorization.   Assistance Recommended at Discharge Frequent or constant Supervision/Assistance  Patient can return home with the following A lot of help with walking and/or transfers;A lot of help with bathing/dressing/bathroom;Assistance with cooking/housework;Direct supervision/assist for medications management;Direct supervision/assist for financial management;Help with stairs or ramp for entrance    Functional Status Assessment  Patient has had a recent decline in their functional status and demonstrates the  ability to make significant improvements in function in a reasonable and predictable amount of time.  Equipment Recommendations  Other (comment) (per next venue of care)    Recommendations for Other Services       Precautions / Restrictions Precautions Precautions: Posterior Hip;Fall Restrictions Weight Bearing Restrictions: Yes RLE Weight Bearing: Weight bearing as tolerated      Mobility Bed Mobility Overal bed mobility: Needs Assistance Bed Mobility: Supine to Sit, Sit to Supine     Supine to sit: Mod assist, HOB elevated Sit to supine: Mod assist, HOB elevated   General bed mobility comments: step by step vcs    Transfers Overall transfer level: Needs assistance Equipment used: Rolling walker (2 wheels) Transfers: Sit to/from Stand Sit to Stand: Mod assist                  Balance Overall balance assessment: Needs assistance Sitting-balance support: Feet supported Sitting balance-Leahy Scale: Fair     Standing balance support: Bilateral upper extremity supported Standing balance-Leahy Scale: Fair                             ADL either performed or assessed with clinical judgement   ADL Overall ADL's : Needs assistance/impaired     Grooming: Wash/dry face;Sitting;Moderate assistance Grooming Details (indicate cue type and reason): anticipated     Lower Body Bathing: Maximal assistance Lower Body Bathing Details (indicate cue type and reason): anticipated     Lower Body Dressing: Maximal assistance;Cueing for sequencing       Toileting- Clothing Manipulation and Hygiene: Maximal assistance;Bed level               Vision Baseline Vision/History: 1 Wears glasses Patient Visual Report: Other (comment) Vision Assessment?: Yes;Vision  impaired- to be further tested in functional context (pt with poor attention to task for vision assessment despite multiple attempts; Does not track in any plane but will functionally look around the  room to objects; will continue to assess) Additional Comments: impared FNF R hand     Perception     Praxis      Pertinent Vitals/Pain Pain Assessment Pain Assessment: No/denies pain     Hand Dominance Left   Extremity/Trunk Assessment Upper Extremity Assessment Upper Extremity Assessment: RUE deficits/detail (LUE generally weak, grossly 3+-4-/5 throughout) RUE Deficits / Details: AROM shoulder flexion: 1/4 full AROM, elbow: 1/2 full AROM, wrist: 1/2 full AROM; PROM appears WFL RUE Sensation: decreased proprioception RUE Coordination: decreased fine motor;decreased gross motor   Lower Extremity Assessment Lower Extremity Assessment: LLE deficits/detail;RLE deficits/detail;Generalized weakness RLE Deficits / Details: 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   Cervical / Trunk Assessment Cervical / Trunk Assessment: Normal   Communication Communication Communication: Expressive difficulties   Cognition Arousal/Alertness: Lethargic Behavior During Therapy: Flat affect Overall Cognitive Status: Impaired/Different from baseline Area of Impairment: Orientation, Attention, Memory, Following commands, Safety/judgement, Awareness, Problem solving                 Orientation Level: Disoriented to, Place, Situation Current Attention Level: Focused Memory: Decreased short-term memory, Decreased recall of precautions Following Commands: Follows one step commands inconsistently Safety/Judgement: Decreased awareness of safety, Decreased awareness of deficits Awareness: Intellectual Problem Solving: Slow processing, Decreased initiation, Difficulty sequencing, Requires verbal cues, Requires tactile cues       General Comments  spo2 >90% on 2 L via Lynd at completion of evaluation, HR 110    Exercises Other Exercises Other Exercises: edu pt and daugthers re: role of OT, role of rehab, recommendations, R sided attention task/use   Shoulder Instructions      Home Living Family/patient expects to be  discharged to:: Private residence Living Arrangements: Other relatives Available Help at Discharge: Available 24 hours/day Type of Home: House Home Access: Ramped entrance     Home Layout: One level     Bathroom Shower/Tub: Chief Strategy Officer: Standard Bathroom Accessibility: Yes   Home Equipment: Pensions consultant (4 wheels);Wheelchair - Forensic psychologist (2 wheels);Grab bars - toilet;Grab bars - tub/shower;Shower seat;Other (comment)      Lives With: Other (Comment) (relatives)    Prior Functioning/Environment Prior Level of Function : Needs assist             Mobility Comments: MOD I with rollator ADLs Comments: assist for IADLs from family due to hip pain prior to hip surgery; generally MOD I for ADLs, assist for LB dressing after hip surgery        OT Problem List: Decreased strength;Decreased range of motion;Decreased activity tolerance;Decreased coordination;Impaired vision/perception;Impaired balance (sitting and/or standing);Decreased cognition;Decreased knowledge of precautions;Decreased knowledge of use of DME or AE;Impaired UE functional use      OT Treatment/Interventions: Self-care/ADL training;DME and/or AE instruction;Therapeutic activities;Balance training;Therapeutic exercise;Energy conservation;Patient/family education    OT Goals(Current goals can be found in the care plan section) Acute Rehab OT Goals Patient Stated Goal: get stronger OT Goal Formulation: With patient Time For Goal Achievement: 02/11/23 Potential to Achieve Goals: Good ADL Goals Pt Will Perform Grooming: with min assist;sitting Pt Will Perform Lower Body Dressing: with mod assist;with adaptive equipment Pt Will Transfer to Toilet: with min assist Pt Will Perform Toileting - Clothing Manipulation and hygiene: with min assist;sitting/lateral leans  OT Frequency: Min 1X/week    Co-evaluation  AM-PAC OT "6 Clicks" Daily Activity      Outcome Measure Help from another person eating meals?: A Little Help from another person taking care of personal grooming?: A Lot Help from another person toileting, which includes using toliet, bedpan, or urinal?: A Lot Help from another person bathing (including washing, rinsing, drying)?: A Lot Help from another person to put on and taking off regular upper body clothing?: A Lot Help from another person to put on and taking off regular lower body clothing?: A Lot 6 Click Score: 13   End of Session Equipment Utilized During Treatment: Rolling walker (2 wheels);Oxygen Nurse Communication: Mobility status  Activity Tolerance: Patient tolerated treatment well Patient left: in bed;with call bell/phone within reach;with bed alarm set;with family/visitor present  OT Visit Diagnosis: Other abnormalities of gait and mobility (R26.89);Other symptoms and signs involving cognitive function;Other symptoms and signs involving the nervous system (R29.898);Hemiplegia and hemiparesis Hemiplegia - Right/Left: Right Hemiplegia - dominant/non-dominant: Non-Dominant Hemiplegia - caused by: Cerebral infarction                Time: 1422-1450 OT Time Calculation (min): 28 min Charges:  OT General Charges $OT Visit: 1 Visit OT Evaluation $OT Eval High Complexity: 1 High Oleta Mouse, OTD OTR/L  01/28/23, 4:00 PM

## 2023-01-28 NOTE — Assessment & Plan Note (Signed)
-   We will continue statin therapy.  Fasting lipids will be checked.

## 2023-01-28 NOTE — Progress Notes (Signed)
The patient was less responsive this morning and not following commands.  She was noted to be weaker on the right upper and lower extremities with facial droop.  Stat head CT scan was repeated and revealed no significant changes from last night.  I saw the patient and discussed the findings and the plan of care again with the family.  Will defer to the neurologist next management steps regarding her carotid stenosis and potential Eliquis failure.  Will add Plavix for now.  Will continue to monitor her closely.

## 2023-01-28 NOTE — TOC Initial Note (Signed)
Transition of Care [facility]) - Initial/Assessment Note    Patient Details  Name: Brooke Davis MRN: [id number] Date of Birth: [birth year]  Transition of Care [facility]) CM/SW Contact:    Allena Katz, LCSW Phone Number: 01/28/2023, 11:18 AM  Clinical Narrative:  Pt admitted with embolic stroke. Pt not fully oriented. PT/OT has been ordered for patient and the recommendation is for SNF. CSW will reach out to family to discuss.                      Patient Goals and CMS Choice            Expected Discharge Plan and Services                                              Prior Living Arrangements/Services                       Activities of Daily Living Home Assistive Devices/Equipment: Environmental consultant (specify type), Wheelchair, Blood pressure cuff, Hearing aid, Eyeglasses, Bedside commode/3-in-1, Shower chair without back, Dentures (specify type), Other (Comment) (pulse ox) ADL Screening (condition at time of admission) Patient's cognitive ability adequate to safely complete daily activities?: Yes Is the patient deaf or have difficulty hearing?: Yes Does the patient have difficulty seeing, even when wearing glasses/contacts?: No Does the patient have difficulty concentrating, remembering, or making decisions?: No Patient able to express need for assistance with ADLs?: Yes Does the patient have difficulty dressing or bathing?: Yes Independently performs ADLs?: Yes (appropriate for developmental age) Does the patient have difficulty walking or climbing stairs?: Yes Weakness of Legs: Both Weakness of Arms/Hands: None  Permission Sought/Granted                  Emotional Assessment              Admission diagnosis:  TIA (transient ischemic attack) [G45.9] Right arm weakness [R29.898] Patient Active Problem List   Diagnosis Date Noted   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   Chronic atrial fibrillation with RVR (HCC) 01/28/2023   Dyslipidemia 01/28/2023    Chronic diastolic CHF (congestive heart failure) (HCC) 01/28/2023   Gout 01/28/2023   Anxiety and depression 01/28/2023   Right arm weakness 01/28/2023   Hx of total hip arthroplasty, right 01/18/2023   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   Slurred speech 09/23/2021   STEMI involving oth coronary artery of inferior wall (HCC) 01/11/2021   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) 01/11/2021   Controlled type 2 diabetes mellitus with hyperglycemia, without long-term current use of insulin (HCC) 01/11/2021   Pure hypercholesterolemia 01/11/2021   Hypercalcemia 01/11/2021   DDD (degenerative disc disease), lumbar 12/01/2013   Lumbar radiculitis 12/01/2013   Atrial fibrillation, transient (HCC) 11/10/2013   Sinoatrial node dysfunction (HCC) 11/10/2013   Primary hypertension 11/10/2013   Poorly-controlled hypertension 11/10/2013   PCP:  [name], MD Pharmacy:   [id number] [place] Rough, [place] - [address] AT [facility] OF [address] [place] [address] Phone: [phone] Fax: [phone]     Social Determinants of Health (SDOH) Social History: SDOH Screenings   Food Insecurity: No Food Insecurity (01/28/2023)  Housing: Low Risk  (01/28/2023)  Transportation Needs: No Transportation Needs (01/28/2023)  Utilities: Not At Risk (01/28/2023)  Financial Resource Strain: Patient Declined (11/05/2022)   Received from [facility]  Tobacco Use: Medium Risk (01/28/2023)   SDOH Interventions:     Readmission Risk Interventions     No data to display

## 2023-01-28 NOTE — Assessment & Plan Note (Signed)
-   We will continue Demadex, Entresto and Lopressor.

## 2023-01-28 NOTE — Significant Event (Signed)
Rapid Response Event Note   Reason for Call : "code stroke" change in mentation/new deficits since 0345  Initial Focused Assessment:  Upon entering the patient's room the patient was laying in bed in no obvious distress. The bedside nurse described the last known well was at 0345. She was able to move all extremities though weak on the right. Now she presents with increased lethargy, new/increased facial droop, and new weakness throughout.   With this RN's NIH assessment, the patient shook head no to orientation questioning. She had no grip on the right side and very weak grip on the left. She was able to wiggle both feet but when asked to raise extremities or when lifted she was unable to break gravity with all extremities. Visual acuity could not be completed due to patient not opening eyes. Pupils were equal, round and reactive when assessed by opening eyelids for the patient. When requested to identify images on cards patient shook head no. This RN asked the patient, "Are you tired and unwilling to participate?" The patient then nodded head yes in response. This RN educated patient on the importance of her participation in the assessment and that the purpose of the assessment was to help evaluate for a new stroke occurring. The patient opened both eyes, directed both eyes to the right corners (side eyed), looked at this RN, and then closed her eyes. The patient refused to participate further.   Meanwhile, the bedside RN spoke with Dr. Arville Care. Per the bedside RN, he stated he did not want a full code stroke activation. He requested that the bedside RN order a CT head to evaluate for changes. The bedside RN stated that the patient has been having these events and if it is that it would not change the course of treatment. Following the CT, he would speak to the radiologist and decide if a change in course or transfer was needed.   Interventions: CT head, CBG  Plan of Care: CT head was complete, results  showed no new changes, please refer to Dr. Chrys Racer note regarding event.   Event Summary:   MD Notified: Dr. Arville Care Call Time: [phone] Arrival Time: 0558 End Time: 9604  Astrid Drafts, RN

## 2023-01-28 NOTE — Progress Notes (Signed)
Chap responded to code stroke. Pt being reviewed by interdisciplinary team. No spiritual or emotional care need at this time. Page the [place] as need arise.   01/28/23 0800  Spiritual Encounters  Type of Visit Initial  Care provided to: Patient  Referral source Code page  Reason for visit Code  OnCall Visit Yes

## 2023-01-28 NOTE — TOC Progression Note (Signed)
Transition of Care [facility]) - Progression Note    Patient Details  Name: MAEGEN WIGLE MRN: [id number] Date of Birth: 1940/09/06  Transition [facility] Care [facility] CM/SW Contact  Allena Katz, LCSW Phone Number: 01/28/2023, 2:59 PM  Clinical Narrative:   CSW spoke with patients daughters regarding SNF. They report she has never been so they are unsure where they would like her to go. They did say to not send to [facility] and would like the referral sent out to other places in [place].          Expected Discharge Plan and Services                                               Social Determinants of Health (SDOH) Interventions SDOH Screenings   Food Insecurity: No Food Insecurity (01/28/2023)  Housing: Low Risk  (01/28/2023)  Transportation Needs: No Transportation Needs (01/28/2023)  Utilities: Not At Risk (01/28/2023)  Financial Resource Strain: Patient Declined (11/05/2022)   Received from [facility]  Tobacco Use: Medium Risk (01/28/2023)    Readmission Risk Interventions     No data to display

## 2023-01-28 NOTE — Progress Notes (Signed)
PROGRESS NOTE    Brooke Davis  ZOX:[id number] DOB: [birth year] DOA: 01/27/2023 PCP: [name], MD    Brief Narrative:  82 y.o. female with medical history significant for type 2 diabetes mellitus, hypertension, dyslipidemia, coronary artery disease status post PCI and stent, diastolic dysfunction, and anxiety, who presented to the emergency room with acute onset of altered mental status with confusion after waking up from a nap at 3 PM.  She started her nap at her baseline around 1 PM.  She was having slurred speech and expressive aphasia as well as right arm weakness and neglect.  She just had a right hip surgery on 7/1.  She denies any paresthesias.  No weakness seizures.  No tinnitus or vertigo.  She has been having constipation and low back pain.  She denies any fever or chills.  No chest pain or palpitations.  No cough or wheezing.  No dysuria, oliguria or hematuria or flank pain.   Symptoms had initially improved the emergency room however patient had a deterioration in neurologic function early a.m. on 7/11.  Repeat head CT without significant interval change.  Case discussed with neurology.   Assessment & Plan:   Principal Problem:   Embolic stroke [facility]) Active Problems:   Chronic atrial fibrillation with RVR (HCC)   Chronic diastolic CHF (congestive heart failure) (HCC)   Dyslipidemia   Gout   Anxiety and depression   Right arm weakness  Acute CVA Bilateral carotid artery stenosis left greater than right Embolic appearing however distribution of infarct is with in the left carotid artery territory.  Raises suspicion for carotid artery stenosis as primary driving factor.  This is not necessarily being considered an Eliquis treatment failure. Plan: DAPT aspirin Plavix High intensity statin 2D echocardiogram with bubble Check fasting lipids PT OT ST Permissive hypertension Neurology follow-up   Chronic atrial fibrillation with RVR (HCC) PTA Eliquis and Lopressor    Chronic diastolic CHF (congestive heart failure) (HCC) Hold Demadex and Entresto given need for permissive hypertension.  Can continue Lopressor for rate control   Anxiety and depression PTA Xanax and Celexa   Gout PTA allopurinol   Dyslipidemia High intensity statin  Functional decline Palliative care evaluation   DVT prophylaxis: Apixaban Code Status: Full Family Communication: Daughter at bedside 7/11 Disposition Plan: Status is: Observation The patient will require care spanning > 2 midnights and should be moved to inpatient because: Acute CVA   Level of care: Telemetry Medical  Consultants:  Neurology  Procedures:  None  Antimicrobials: None   Subjective: Seen and examined.  History obtained from patient's daughter at bedside.  Patient appears confused.  Not participating in interview  Objective: Vitals:   01/28/23 0613 01/28/23 0735 01/28/23 0855 01/28/23 1113  BP: (!) 150/59 112/61 122/70 129/67  Pulse: 66 65 69 70  Resp: 20 20  20   Temp: 98 F (36.7 C) 98.6 F (37 C)  98.4 F (36.9 C)  TempSrc:  Oral    SpO2: 100% 90%  100%  Weight:      Height:        Intake/Output Summary (Last 24 hours) at 01/28/2023 1331 Last data filed at 01/28/2023 1139 Gross per 24 hour  Intake --  Output 1000 ml  Net -1000 ml   Filed Weights   01/27/23 1819 01/28/23 0100  Weight: 79.4 kg 87.6 kg    Examination:  General exam: Appears confused. Respiratory system: Lungs clear.  Normal work of breathing.  Room air Cardiovascular system: S1-S2, regular  rate, irregular rhythm, 2/6 murmur Gastrointestinal system: Soft, NT/ND, normal bowel sounds Central nervous system: Alert.  Oriented x 0.  Right-sided weakness.  Right facial droop Extremities: RUE and RLE weakness Skin: No rashes, lesions or ulcers Psychiatry: Judgement and insight appear impaired. Mood & affect confused.     Data Reviewed: I have personally reviewed following labs and imaging  studies  CBC: Recent Labs  Lab 01/27/23 1827 01/28/23 0400  WBC 8.3 7.7  HGB 8.8* 8.6*  HCT 28.1* 27.0*  MCV 101.1* 101.1*  PLT 311 295   Basic Metabolic Panel: Recent Labs  Lab 01/27/23 1827 01/28/23 0400  NA 134* 136  K 4.5 4.5  CL 100 104  CO2 25 27  GLUCOSE 123* 124*  BUN 15 14  CREATININE 0.99 1.03*  CALCIUM 8.8* 8.7*   GFR: Estimated Creatinine Clearance: 45.5 mL/min (A) (by C-G formula based on SCr of 1.03 mg/dL (H)). Liver Function Tests: Recent Labs  Lab 01/27/23 1827  AST 23  ALT 18  ALKPHOS 98  BILITOT 0.7  PROT 6.3*  ALBUMIN 2.7*   Recent Labs  Lab 01/27/23 1827  LIPASE 34   No results for input(s): "AMMONIA" in the last 168 hours. Coagulation Profile: No results for input(s): "INR", "PROTIME" in the last 168 hours. Cardiac Enzymes: No results for input(s): "CKTOTAL", "CKMB", "CKMBINDEX", "TROPONINI" in the last 168 hours. BNP (last 3 results) No results for input(s): "PROBNP" in the last 8760 hours. HbA1C: No results for input(s): "HGBA1C" in the last 72 hours. CBG: Recent Labs  Lab 01/28/23 0551 01/28/23 0838 01/28/23 1109  GLUCAP 134* 108* 97   Lipid Profile: Recent Labs    01/28/23 0400  CHOL 86  HDL 29*  LDLCALC 46  TRIG 55  CHOLHDL 3.0   Thyroid Function Tests: No results for input(s): "TSH", "T4TOTAL", "FREET4", "T3FREE", "THYROIDAB" in the last 72 hours. Anemia Panel: No results for input(s): "VITAMINB12", "FOLATE", "FERRITIN", "TIBC", "IRON", "RETICCTPCT" in the last 72 hours. Sepsis Labs: No results for input(s): "PROCALCITON", "LATICACIDVEN" in the last 168 hours.  No results found for this or any previous visit (from the past 240 hour(s)).       Radiology Studies: ECHOCARDIOGRAM COMPLETE BUBBLE STUDY  Result Date: 01/28/2023    ECHOCARDIOGRAM REPORT   Patient Name:   Brooke Davis [facility] Date of Exam: 01/28/2023 Medical Rec #:  [id number]        Height:       62.0 in Accession #:    [id number]       Weight:        193.1 lb Date of Birth:  [birth year]        BSA:          1.883 m Patient Age:    81 years         BP:           112/61 mmHg Patient Gender: F                HR:           65 bpm. Exam Location:  [facility] Procedure: 2D Echo, Cardiac Doppler, Color Doppler and Saline Contrast Bubble            Study Indications:     TIA 435.9 / G45.9  History:         Patient has prior history of Echocardiogram examinations, most                  recent 01/14/2021.  Arrythmias:Atrial Fibrillation; Risk                  Factors:Hypertension.  Sonographer:     Cristela Blue Referring Phys:  [id number] JAN A MANSY Diagnosing Phys: Julien Nordmann MD  Sonographer Comments: Suboptimal apical window. IMPRESSIONS  1. Left ventricular ejection fraction, by estimation, is 60 to 65%. The left ventricle has normal function. The left ventricle has no regional wall motion abnormalities. Left ventricular diastolic parameters are indeterminate.  2. Right ventricular systolic function is normal. The right ventricular size is normal. There is normal pulmonary artery systolic pressure. The estimated right ventricular systolic pressure is 26.3 mmHg.  3. Left atrial size was moderately dilated.  4. Right atrial size was mildly dilated.  5. The mitral valve is normal in structure. Mild mitral valve regurgitation. No evidence of mitral stenosis.  6. Tricuspid valve regurgitation is mild to moderate.  7. The aortic valve is normal in structure. Aortic valve regurgitation is not visualized. Aortic valve sclerosis is present, with no evidence of aortic valve stenosis.  8. The inferior vena cava is normal in size with greater than 50% respiratory variability, suggesting right atrial pressure of 3 mmHg.  9. Agitated saline contrast bubble study was negative, with no evidence of any interatrial shunt. FINDINGS  Left Ventricle: Left ventricular ejection fraction, by estimation, is 60 to 65%. The left ventricle has normal function. The left ventricle has no regional wall  motion abnormalities. The left ventricular internal cavity size was normal in size. There is  no left ventricular hypertrophy. Left ventricular diastolic parameters are indeterminate. Right Ventricle: The right ventricular size is normal. No increase in right ventricular wall thickness. Right ventricular systolic function is normal. There is normal pulmonary artery systolic pressure. The tricuspid regurgitant velocity is 2.31 m/s, and  with an assumed right atrial pressure of 5 mmHg, the estimated right ventricular systolic pressure is 26.3 mmHg. Left Atrium: Left atrial size was moderately dilated. Right Atrium: Right atrial size was mildly dilated. Pericardium: There is no evidence of pericardial effusion. Mitral Valve: The mitral valve is normal in structure. There is mild calcification of the mitral valve leaflet(s). Mild mitral annular calcification. Mild mitral valve regurgitation. No evidence of mitral valve stenosis. Tricuspid Valve: The tricuspid valve is normal in structure. Tricuspid valve regurgitation is mild to moderate. No evidence of tricuspid stenosis. Aortic Valve: The aortic valve is normal in structure. Aortic valve regurgitation is not visualized. Aortic valve sclerosis is present, with no evidence of aortic valve stenosis. Aortic valve mean gradient measures 8.3 mmHg. Aortic valve peak gradient measures 15.6 mmHg. Aortic valve area, by VTI measures 1.66 cm. Pulmonic Valve: The pulmonic valve was normal in structure. Pulmonic valve regurgitation is not visualized. No evidence of pulmonic stenosis. Aorta: The aortic root is normal in size and structure. Venous: The inferior vena cava is normal in size with greater than 50% respiratory variability, suggesting right atrial pressure of 3 mmHg. IAS/Shunts: No atrial level shunt detected by color flow Doppler. Agitated saline contrast was given intravenously to evaluate for intracardiac shunting. Agitated saline contrast bubble study was negative,  with no evidence of any interatrial shunt. There  is no evidence of a patent foramen ovale. There is no evidence of an atrial septal defect.  LEFT VENTRICLE PLAX 2D LVIDd:         3.90 cm   Diastology LVIDs:         2.50 cm   LV e' medial:    8.16  cm/s LV PW:         0.80 cm   LV E/e' medial:  13.7 LV IVS:        1.40 cm   LV e' lateral:   11.10 cm/s LVOT diam:     2.00 cm   LV E/e' lateral: 10.1 LV SV:         67 LV SV Index:   36 LVOT Area:     3.14 cm  RIGHT VENTRICLE RV Basal diam:  3.70 cm RV Mid diam:    3.20 cm RV S prime:     13.10 cm/s TAPSE (M-mode): 1.3 cm LEFT ATRIUM             Index        RIGHT ATRIUM           Index LA diam:        4.10 cm 2.18 cm/m   RA Area:     22.60 cm LA Vol (A2C):   68.3 ml 36.26 ml/m  RA Volume:   74.90 ml  39.77 ml/m LA Vol (A4C):   82.3 ml 43.70 ml/m LA Biplane Vol: 80.7 ml 42.85 ml/m  AORTIC VALVE AV Area (Vmax):    1.49 cm AV Area (Vmean):   1.57 cm AV Area (VTI):     1.66 cm AV Vmax:           197.33 cm/s AV Vmean:          133.667 cm/s AV VTI:            0.406 m AV Peak Grad:      15.6 mmHg AV Mean Grad:      8.3 mmHg LVOT Vmax:         93.40 cm/s LVOT Vmean:        67.000 cm/s LVOT VTI:          0.214 m LVOT/AV VTI ratio: 0.53  AORTA Ao Root diam: 3.30 cm MITRAL VALVE                TRICUSPID VALVE MV Area (PHT): 4.26 cm     TR Peak grad:   21.3 mmHg MV Decel Time: 178 msec     TR Vmax:        231.00 cm/s MV E velocity: 112.00 cm/s                             SHUNTS                             Systemic VTI:  0.21 m                             Systemic Diam: 2.00 cm Julien Nordmann MD Electronically signed by Julien Nordmann MD Signature Date/Time: 01/28/2023/12:52:11 PM    Final    CT HEAD CODE STROKE WO CONTRAST`  Addendum Date: 01/28/2023   ADDENDUM REPORT: 01/28/2023 06:48 ADDENDUM: Study discussed by telephone with Dr. Valente David on 01/28/2023 at 0642 hours. Electronically Signed   By: Odessa Fleming M.D.   On: 01/28/2023 06:48   Result Date:  01/28/2023 CLINICAL DATA:  Code stroke. 82 year old female with weakness and altered mental status, change in stroke symptoms. Numerous scattered small foci of ischemia in the left hemisphere on MRI yesterday. EXAM: CT HEAD WITHOUT CONTRAST TECHNIQUE: Contiguous axial  images were obtained from the base of the skull through the vertex without intravenous contrast. RADIATION DOSE REDUCTION: This exam was performed according to the departmental dose-optimization program which includes automated exposure control, adjustment of the mA and/or kV according to patient size and/or use of iterative reconstruction technique. COMPARISON:  Brain MRI 2317 hours yesterday. Head CT 1859 hours yesterday. FINDINGS: Brain: No acute intracranial hemorrhage identified. No midline shift, mass effect, or evidence of intracranial mass lesion. Stable ventricle size and configuration. Patchy bilateral white matter hypodensity is stable. The multiple small foci of abnormal diffusion scattered in the left hemisphere on MRI last night are largely occult by CT. Stable gray-white matter differentiation throughout the brain. No new cortically based infarct identified. Vascular: No suspicious intracranial vascular hyperdensity. Calcified atherosclerosis at the skull base. Skull: No acute osseous abnormality identified. Mild motion artifact. Sinuses/Orbits: Visualized paranasal sinuses and mastoids are stable and well aerated. Other: No gaze deviation, acute orbit or scalp soft tissue finding. ASPECTS [facility] Stroke Program Early CT Score) Total score (0-10 with 10 being normal): 10 IMPRESSION: Stable noncontrast CT appearance of the brain since yesterday, the multiple small infarcts by MRI are largely occult by CT. No new intracranial abnormality. Electronically Signed: By: Odessa Fleming M.D. On: 01/28/2023 06:37   MR BRAIN WO CONTRAST  Result Date: 01/28/2023 CLINICAL DATA: Confusion, right upper extremity weakness EXAM: MRI HEAD WITHOUT CONTRAST  TECHNIQUE: Multiplanar, multiecho pulse sequences of the brain and surrounding structures were obtained without intravenous contrast. COMPARISON:  No prior MRI available, correlation is made with 01/27/2023 CT head and CTA head and neck FINDINGS: Evaluation is significantly limited by motion, with the exception of the diffusion-weighted sequence. Brain: Scattered foci of cortical and white matter restricted diffusion with ADC correlates in the left cerebral hemisphere and left caudate (series 5, images 20-38). Evaluation for T2 hyperintense correlate is limited by motion artifact No definite acute hemorrhage, mass, mass effect, or midline shift. No hydrocephalus or extra-axial collection. Vascular: As seen on the prior CTA, the right V4 flow void is not well seen. Otherwise normal arterial flow voids. Skull and upper cervical spine: Grossly normal marrow signal. Sinuses/Orbits: Clear paranasal sinuses. Status post bilateral lens replacements. Other: The mastoid air cells are well aerated. IMPRESSION: Evaluation is significantly limited by motion, with the exception of the diffusion-weighted sequence. 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 These results were called by telephone at the time of interpretation on 01/28/2023 at 1:05 am to provider [facility] , who verbally acknowledged these results. Electronically Signed   By: Wiliam Ke M.D.   On: 01/28/2023 01:05   CT ANGIO HEAD NECK W WO CM  Result Date: 01/27/2023 CLINICAL DATA:  Confusion and right upper extremity weakness EXAM: CT ANGIOGRAPHY HEAD AND NECK WITH AND WITHOUT CONTRAST TECHNIQUE: Multidetector CT imaging of the head and neck was performed using the standard protocol during bolus administration of intravenous contrast. Multiplanar CT image reconstructions and MIPs were obtained to evaluate the vascular anatomy. Carotid stenosis measurements (when applicable) are  obtained utilizing NASCET criteria, using the distal internal carotid diameter as the denominator. RADIATION DOSE REDUCTION: This exam was performed according to the departmental dose-optimization program which includes automated exposure control, adjustment of the mA and/or kV according to patient size and/or use of iterative reconstruction technique. CONTRAST:  75mL OMNIPAQUE IOHEXOL 350 MG/ML SOLN COMPARISON:  No prior CTA available, correlation is made with CT head 01/27/2023  FINDINGS: CT HEAD FINDINGS For noncontrast findings, please see same day CT head. CTA NECK FINDINGS Aortic arch: Evaluation of the arch is somewhat limited by motion. Within this limitation, Two-vessel arch with a common origin of the brachiocephalic and left common carotid arteries. Imaged portion shows no evidence of aneurysm or dissection. No significant stenosis of the major arch vessel origins. Mild aortic atherosclerosis. Right carotid system: 60% stenosis in the proximal right ICA (series 6, image 194). Additional atherosclerotic in the right common carotid artery is not hemodynamically significant. Left carotid system: 70% stenosis in the proximal left ICA (series 6, image 199). Additional atherosclerotic disease in the common carotid artery is not hemodynamically significant. Vertebral arteries: Left dominant system. The left vertebral artery is patent from its origin to the skull base without significant stenosis or evidence of dissection. The right vertebral artery is quite diminutive, and poorly perfused in the distal V3 segment (series 6, image 159), likely reflecting moderate stenosis. Skeleton: No acute osseous abnormality. Degenerative changes in the cervical spine. Other neck: Hypoenhancing and calcified nodules in the thyroid, the largest of which measures up to 6 mm, for which no follow-up is currently indicated. (Reference: J Am Coll Radiol. 2015 Feb;12(2): 143-50) Upper chest: No focal pulmonary opacity or pleural  effusion. Mild centrilobular emphysema. Review of the MIP images confirms the above findings CTA HEAD FINDINGS Anterior circulation: Both internal carotid arteries are patent to the termini, with mild stenosis in the bilateral cavernous and supraclinoid segments. A1 segments patent. Normal anterior communicating artery. Anterior cerebral arteries are patent to their distal aspects without significant stenosis. No M1 stenosis or occlusion. MCA branches are irregular but perfused to their distal aspects without significant stenosis. Posterior circulation: Nonopacification of the proximal right V4 (series 6, image 146), consistent with occlusion. The more distal right V4 is opacified, likely retrograde. The left vertebral artery is patent to the vertebrobasilar junction with mild stenosis in the proximal V4 (series 6, image 141). Posteroinferior cerebellar arteries are not definitively visualized. Basilar patent to its distal aspect without significant stenosis. Superior cerebellar arteries patent proximally. Patent P1 segments. Mild stenosis in the proximal right P2 (series 6, image 99), and mid left P 2 (series 6, image 105). The remainder of the PCAs are irregular but do not demonstrate significant stenosis. The right posterior communicating artery is patent. The left is not definitively seen. Venous sinuses: As permitted by contrast timing, patent. Anatomic variants: None significant. Review of the MIP images confirms the above findings IMPRESSION: 1. 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 This is of indeterminate acuity. 2. 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 3. Mild stenosis in the bilateral cavernous and supraclinoid ICAs. 4. Mild stenosis in the proximal right P2 and mid left P2. 5. Aortic atherosclerosis. 6. Emphysema. Aortic Atherosclerosis (ICD10-I70.0) and Emphysema (ICD10-J43.9). These results were  called by telephone at the time of interpretation on 01/27/2023 at 10:07 pm to provider [facility] , who verbally acknowledged these results. Electronically Signed   By: Wiliam Ke M.D.   On: 01/27/2023 22:07   CT Head Wo Contrast  Result Date: 01/27/2023 CLINICAL DATA:  Confusion. EXAM: CT HEAD WITHOUT CONTRAST TECHNIQUE: Contiguous axial images were obtained from the base of the skull through the vertex without intravenous contrast. RADIATION DOSE REDUCTION: This exam was performed according to the departmental dose-optimization program which includes automated exposure control, adjustment of the mA  and/or kV according to patient size and/or use of iterative reconstruction technique. COMPARISON:  Head CT dated 10/03/2021. FINDINGS: Brain: Mild age-related atrophy and moderate chronic microvascular ischemic changes. There is no acute intracranial hemorrhage. No mass effect or midline shift. No extra-axial fluid collection. Vascular: No hyperdense vessel or unexpected calcification. Skull: Normal. Negative for fracture or focal lesion. Sinuses/Orbits: No acute finding. Other: None IMPRESSION: 1. No acute intracranial pathology. 2. Mild age-related atrophy and moderate chronic microvascular ischemic changes. Electronically Signed   By: Elgie Collard M.D.   On: 01/27/2023 19:14        Scheduled Meds:  allopurinol  300 mg Oral Daily   apixaban  5 mg Oral BID   aspirin EC  81 mg Oral Daily   atorvastatin  80 mg Oral Daily   citalopram  10 mg Oral Daily   clopidogrel  75 mg Oral Daily   feeding supplement  237 mL Oral BID BM   insulin aspart  0-5 Units Subcutaneous QHS   insulin aspart  0-9 Units Subcutaneous TID WC   metoprolol tartrate  25 mg Oral BID   potassium chloride  10 mEq Oral Daily   sacubitril-valsartan  1 tablet Oral BID   torsemide  20 mg Oral Daily   Continuous Infusions:  sodium chloride 100 mL/hr at 01/28/23 1313     LOS: 0 days    Tresa Moore, MD [facility]   If 7PM-7AM, please contact night-coverage  01/28/2023, 1:31 PM

## 2023-01-28 NOTE — Consult Note (Signed)
Neurology Consultation Reason for Consult: Stroke Referring Physician: Diona Fanti  CC: Speech difficulty  History is obtained from: Patient, family  HPI: Brooke Davis is a 82 y.o. female with a history of atrial fibrillation on apixaban who recently had a hip surgery on July 1.  She did hold her apixaban for 3 days prior to surgery, and family states that she may have had some mild difficulty with speech prior to this, but not definite.  She was convalescing after surgery and improving, but then was noted to have increased speech difficulty yesterday morning.  This then markedly worsened in the afternoon and she was brought into the emergency department where an MRI was performed demonstrating multifocal ischemia in the left ICA distribution.   LKW: Tuesday evening tnk given?: no, outside of window   Past Medical History:  Diagnosis Date   Anxiety    a.) on BZO (alprazolam) PRN   Arthritis    fingers, right hip   Atrial fibrillation (HCC) 11/10/2013   a.) CHA2DS2VASc = 5 (age x2, sex, HTN, T2DM);  b.) rate/rhythm maintained on oral metoprolol tartrate; chronically anticoagulated with apixaban   Bronchitis    Cerebrovascular small vessel disease (chronic)    Coronary artery disease involving native coronary artery of native heart 01/11/2021   a.) LHC 01/11/2021: 40% oLM, 50% mLCx, 30% oLCx, 30% pLAD, 30% mLAD, 99% pRCA (2.75 x 30 mm Resolute Onyx DES), 75% p-mRCA, 60% dRCA   DDD (degenerative disc disease), lumbar 12/01/2013   Diastolic dysfunction 01/12/2021   a.) TTE 01/12/2021 (s/p inf STEMI): EF 55-60%, mild-mod LAE, mild RAE, mod RVE, mild AR/MR, mod TR, G1DD   Diet-controlled type 2 diabetes mellitus (HCC)    Full dentures    Gout    History of bilateral cataract extraction 2021   Hyperlipidemia    Hypertension, essential    Long term current use of anticoagulant    a.) apixaban   PONV (postoperative nausea and vomiting)    Sinoatrial node dysfunction (HCC) 11/10/2013    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) 01/11/2021   a.) LHC/PCI 01/11/2021 --> culprit lesion 99% pRCA --> 2.75 x 30 mm Resolute Onyx DES x 1   Varicose veins of legs    Wears hearing aid in both ears      Family History  Problem Relation Age of Onset   Heart attack Mother 76       required open heart surgery. Onset age is unknown.   Stroke Father    Diabetes Father    Alcoholism Brother    Breast cancer Neg Hx      Social History:  reports that she quit smoking about 44 years ago. Her smoking use included cigarettes. She has never used smokeless tobacco. She reports that she does not currently use alcohol. She reports that she does not use drugs.   Exam: Current vital signs: BP 129/67 (BP Location: Right Arm)   Pulse 70   Temp 98.4 F (36.9 C)   Resp 20   Ht 5\' 2"  (1.575 m)   Wt 87.6 kg   SpO2 100%   BMI 35.32 kg/m  Vital signs in last 24 hours: Temp:  [98 F (36.7 C)-98.6 F (37 C)] 98.4 F (36.9 C) (07/11 1113) Pulse Rate:  [65-81] 70 (07/11 1113) Resp:  [14-20] 20 (07/11 1113) BP: (112-150)/(52-72) 129/67 (07/11 1113) SpO2:  [90 %-100 %] 100 % (07/11 1113) Weight:  [79.4 kg-87.6 kg] 87.6 kg (07/11 0100)  Physical Exam  Appears well-developed and well-nourished.   Neuro: Mental Status: Patient is awake, alert, she is able to tell me her name, but does have significant difficulty with repetition, and some mild word finding difficulty. Cranial Nerves: II: Visual Fields are full. Pupils are equal, round, and reactive to light.   III,IV, VI: EOMI without ptosis or diploplia.  V: Facial sensation is symmetric to temperature VII: Facial movement is weak on the right VIII: hearing is intact to voice X: Uvula elevates symmetrically XI: Shoulder shrug is symmetric. XII: tongue is midline without atrophy or fasciculations.  Motor: Tone is normal. Bulk is normal. 5/5 strength was present on the left, she has 4/5 weakness of the right  arm and leg Sensory: Sensation is symmetric to light touch and temperature in the arms and legs. Cerebellar: FNF and HKS consistent with weakness in the right      I have reviewed labs in epic and the results pertinent to this consultation are: Creatinine 1.03 LDL 46  Echo without clear embolic source  I have reviewed the images obtained: MRI brain-multifocal infarcts in the left ICA distribution CTA-multifocal stenosis including 70% in the proximal left ICA  Impression: 82 year old female with recent hip surgery with multifocal left ICA distribution ischemia.  She did miss 3 days of her anticoagulation, so there is a possibility that this represents a single embolic event that broke up and went distally.  I think more likely, this represents symptomatic carotid disease and would recommend getting vascular surgery to consider this.  The patient is hesitant to undergo any further general anesthesia.  Individual strokes are all small, and I think that especially given the possibility that this embolic event happened in the setting of holding anticoagulation, I would favor continuing anticoagulation without interruption at this time.  I discussed that there was some risk of doing so, but I think that overall this is the better option.  Recommendations: 1) continue apixaban 5 mg twice daily 2) vascular surgery consultation 3) LDL is at goal, continue home atorvastatin 4) A1c is 6.1, at goal 5) neurology will follow  Ritta Slot, MD [facility] [phone]  If 7pm- 7am, please page neurology on call as listed in AMION.

## 2023-01-28 NOTE — [place] FL2 (Signed)
[place] MEDICAID FL2 LEVEL OF CARE FORM     IDENTIFICATION  Patient Name: Brooke Davis Birthdate: 1941/02/15 Sex: female Admission Date (Current Location): 01/27/2023  [facility] and [place] Number:  Chiropodist and Address:  [facility], [address], [place], [place] [address]      Provider Number: [id number]  Attending Physician Name and Address:  Tresa Moore, MD  Relative Name and Phone Number:  Bjorn Loser (Daughter)  [phone]    Current Level of Care: Hospital Recommended Level of Care: Skilled Nursing Facility Prior Approval Number:    Date Approved/Denied:   PASRR Number: pending  Discharge Plan:      Current Diagnoses: Patient Active Problem List   Diagnosis Date Noted   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   Chronic atrial fibrillation with RVR (HCC) 01/28/2023   Dyslipidemia 01/28/2023   Chronic diastolic CHF (congestive heart failure) (HCC) 01/28/2023   Gout 01/28/2023   Anxiety and depression 01/28/2023   Right arm weakness 01/28/2023   Acute CVA (cerebrovascular accident) (HCC) 01/28/2023   Bilateral carotid artery stenosis 01/28/2023   Hx of total hip arthroplasty, right 01/18/2023   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   Slurred speech 09/23/2021   STEMI involving oth coronary artery of inferior wall (HCC) 01/11/2021   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) 01/11/2021   Controlled type 2 diabetes mellitus with hyperglycemia, without long-term current use of insulin (HCC) 01/11/2021   Pure hypercholesterolemia 01/11/2021   Hypercalcemia 01/11/2021   DDD (degenerative disc disease), lumbar 12/01/2013   Lumbar radiculitis 12/01/2013   Atrial fibrillation, transient (HCC) 11/10/2013   Sinoatrial node dysfunction (HCC) 11/10/2013   Primary hypertension 11/10/2013   Poorly-controlled hypertension 11/10/2013    Orientation RESPIRATION  BLADDER Height & Weight     Self, Place  O2 Continent Weight: 193 lb 2 oz (87.6 kg) Height:  5\' 2"  (157.5 cm)  BEHAVIORAL SYMPTOMS/MOOD NEUROLOGICAL BOWEL NUTRITION STATUS      Continent Diet  AMBULATORY STATUS COMMUNICATION OF NEEDS Skin   Extensive Assist Verbally  (Incision on R thigh)                       Personal Care Assistance Level of Assistance  Feeding, Bathing, Dressing Bathing Assistance: Maximum assistance Feeding assistance: Limited assistance Dressing Assistance: Maximum assistance     Functional Limitations Info  Sight, Hearing, Speech Sight Info: Adequate Hearing Info: Adequate Speech Info: Adequate    SPECIAL CARE FACTORS FREQUENCY  PT (By licensed PT), OT (By licensed OT)     PT Frequency: 5 times a week OT Frequency: 5 times a week            Contractures Contractures Info: Not present    Additional Factors Info  Code Status, Allergies Code Status Info: FULL Allergies Info: Celebrex (Celecoxib)  Penicillins  Relafen (Nabumetone)  Lovastatin           Current Medications (01/28/2023):  This is the current hospital active medication list Current Facility-Administered Medications  Medication Dose Route Frequency Provider Last Rate Last Admin   0.9 %  sodium chloride infusion   Intravenous Continuous Mansy, Jan A, MD 100 mL/hr at 01/28/23 1313 New Bag at 01/28/23 1313   acetaminophen (TYLENOL) tablet 650 mg  650 mg Oral Q6H PRN Mansy, Jan A, MD       Or   acetaminophen (TYLENOL) suppository 650 mg  650 mg Rectal Q6H PRN  Mansy, Jan A, MD       allopurinol (ZYLOPRIM) tablet 300 mg  300 mg Oral Daily Mansy, Jan A, MD   300 mg at 01/28/23 5284   ALPRAZolam Prudy Feeler) tablet 0.25 mg  0.25 mg Oral BID PRN Mansy, Jan A, MD       apixaban Everlene Balls) tablet 5 mg  5 mg Oral BID Mansy, Jan A, MD   5 mg at 01/28/23 1324   aspirin EC tablet 81 mg  81 mg Oral Daily Mansy, Jan A, MD   81 mg at 01/28/23 0909   atorvastatin (LIPITOR) tablet 80 mg  80 mg Oral  Daily Mansy, Jan A, MD   80 mg at 01/28/23 0909   citalopram (CELEXA) tablet 10 mg  10 mg Oral Daily Mansy, Jan A, MD   10 mg at 01/28/23 0910   clopidogrel (PLAVIX) tablet 75 mg  75 mg Oral Daily Mansy, Jan A, MD   75 mg at 01/28/23 0909   feeding supplement (ENSURE ENLIVE / ENSURE PLUS) liquid 237 mL  237 mL Oral BID BM Sreenath, Sudheer B, MD   237 mL at 01/28/23 1320   insulin aspart (novoLOG) injection 0-5 Units  0-5 Units Subcutaneous QHS Mansy, Jan A, MD       insulin aspart (novoLOG) injection 0-9 Units  0-9 Units Subcutaneous TID WC Mansy, Jan A, MD       magnesium hydroxide (MILK OF MAGNESIA) suspension 30 mL  30 mL Oral Daily PRN Mansy, Jan A, MD       metoprolol tartrate (LOPRESSOR) tablet 25 mg  25 mg Oral BID Mansy, Jan A, MD   25 mg at 01/28/23 0909   ondansetron (ZOFRAN) tablet 4 mg  4 mg Oral Q6H PRN Mansy, Jan A, MD       Or   ondansetron [facility]) injection 4 mg  4 mg Intravenous Q6H PRN Mansy, Jan A, MD       oxyCODONE (Oxy IR/ROXICODONE) immediate release tablet 5 mg  5 mg Oral Q4H PRN Mansy, Jan A, MD   5 mg at 01/27/23 2214   potassium chloride (KLOR-CON M) CR tablet 10 mEq  10 mEq Oral Daily Mansy, Jan A, MD   10 mEq at 01/28/23 4010   traMADol (ULTRAM) tablet 50-100 mg  50-100 mg Oral Q4H PRN Mansy, Jan A, MD   50 mg at 01/28/23 0909   traZODone (DESYREL) tablet 25 mg  25 mg Oral QHS PRN Mansy, Jan A, MD   25 mg at 01/27/23 2214     Discharge Medications: Please see discharge summary for a list of discharge medications.  Relevant Imaging Results:  Relevant Lab Results:   Additional Information SS [id number]  Allena Katz, LCSW

## 2023-01-28 NOTE — Assessment & Plan Note (Signed)
-   We will continue Eliquis and Lopressor. 

## 2023-01-28 NOTE — TOC PASRR Note (Signed)
RE:  Brooke Davis  Date of Birth:  [birth year] Date: 01/28/2023     To Whom It May Concern:   Please be advised that the above-named patient will require a short-term nursing home stay - anticipated 30 days or less for rehabilitation and strengthening.  The plan is for return home

## 2023-01-28 NOTE — Progress Notes (Signed)
OT Cancellation Note  Patient Details Name: Brooke Davis MRN: [id number] DOB: September 10, 1940   Cancelled Treatment:    Reason Eval/Treat Not Completed: Other (comment) (pt in room with provider at time of OT eval attempt, OT will reattempt at a later time as approrpiate) Oleta Mouse, OTD OTR/L  01/28/23, 12:32 PM

## 2023-01-28 NOTE — Assessment & Plan Note (Signed)
-   We will continue allopurinol 

## 2023-01-28 NOTE — Assessment & Plan Note (Signed)
-   We will continue Xanax and Celexa. 

## 2023-01-28 NOTE — Evaluation (Signed)
Physical Therapy Evaluation Patient Details Name: Brooke Davis MRN: [id number] DOB: 02-17-1941 Today's Date: 01/28/2023  History of Present Illness  Brooke Davis is a 81yoF who comes to [facility] on 7/10 after acute onset confusion and right upper extremity weakness after napping. Pt returned to baseline after by time of arrival. Pt is s/p THA 01/18/23. PMH: DM2, HTN, CAD s/p PCI, GAD. MRI brain, motion degraded with with "scattered foci of acute infarct in the left cerebral hemisphere and the left caudate."  Clinical Impression  Pt in bed on arival, middle 2 DTRs at bedside who report patient to still appear altered, delayed, lethargic. Pt responds to questioning with delayed reaction time, delayed response, but is limited by malaise and anomia, no somnolence however. Pt agreeable to assessment, requires modA for bed mobility, and minguard for max effort rise to standing, however standing tolerance is limited to less than 20 seconds due to significant pain in hip and global fatigue. After return to supine, pt appears completed fatigued. Pt left in bed with HOB >30 degrees at EOS, DTRs at bedside. Will continue to follow.         Assistance Recommended at Discharge Intermittent Supervision/Assistance  If plan is discharge home, recommend the following:  Can travel by private vehicle  Assistance with cooking/housework;Assist for transportation;Help with stairs or ramp for entrance;A lot of help with walking and/or transfers;Two people to help with bathing/dressing/bathroom;Direct supervision/assist for medications management   No    Equipment Recommendations    Recommendations for Other Services       Functional Status Assessment Patient has had a recent decline in their functional status and demonstrates the ability to make significant improvements in function in a reasonable and predictable amount of time.     Precautions / Restrictions Precautions Precautions: Posterior  Hip;Fall Precaution Booklet Issued: No Restrictions Weight Bearing Restrictions: Yes RLE Weight Bearing: Weight bearing as tolerated      Mobility  Bed Mobility Overal bed mobility: Modified Independent Bed Mobility: Supine to Sit     Supine to sit: HOB elevated, Mod assist     General bed mobility comments: modA to get to EOB    Transfers Overall transfer level: Modified independent Equipment used: Rolling walker (2 wheels) Transfers: Sit to/from Stand Sit to Stand: Min guard           General transfer comment: max effort required, very heavy arms on RW, and clear breaking of flexion range precaution.    Ambulation/Gait Ambulation/Gait assistance:  (able to manage 1 lateral side step. then sits after 20 second sstanding due to complete exhaustion.)                Stairs            Wheelchair Mobility     Tilt Bed    Modified Rankin (Stroke Patients Only)       Balance                                             Pertinent Vitals/Pain Pain Assessment Pain Assessment: Faces Faces Pain Scale: Hurts even more Pain Location: R hip Pain Intervention(s): Limited activity within patient's tolerance, Monitored during session    Home Living Family/patient expects to be discharged to:: Private residence Living Arrangements: Other relatives Available Help at Discharge: Family;Available 24 hours/day Type of Home: House Home Access: Ramped entrance  Home Layout: One level Home Equipment: Toilet riser;BSC/3in1;Rollator (4 wheels);Wheelchair - Forensic psychologist (2 wheels);Grab bars - toilet;Grab bars - tub/shower;Shower seat;Other (comment)      Prior Function Prior Level of Function : Needs assist             Mobility Comments: MOD (I) with rollator ADLs Comments: Usually (I) ADLs; intermittent assist such as t/f into bed (high bed with stool) due to pain. Pt states that she was using reacher for LB dressing at  baseline 2/2 pain. Adjustable bed at home.     Hand Dominance        Extremity/Trunk Assessment                Communication   Communication: No difficulties  Cognition Arousal/Alertness: Lethargic Behavior During Therapy: Flat affect (appears exhausted, delayed reaction time, delayed processing time.)                                            General Comments      Exercises     Assessment/Plan    PT Assessment Patient needs continued PT services  PT Problem List Decreased strength;Decreased range of motion;Decreased activity tolerance;Decreased knowledge of precautions;Decreased balance;Decreased mobility;Decreased coordination;Decreased cognition       PT Treatment Interventions DME instruction;Neuromuscular re-education;Gait training;Balance training;Stair training;Functional mobility training;Patient/family education;Therapeutic activities;Therapeutic exercise    PT Goals (Current goals can be found in the Care Plan section)  Acute Rehab PT Goals PT Goal Formulation: Patient unable to participate in goal setting    Frequency Min 1X/week     Co-evaluation               AM-PAC PT "6 Clicks" Mobility  Outcome Measure Help needed turning from your back to your side while in a flat bed without using bedrails?: A Little Help needed moving from lying on your back to sitting on the side of a flat bed without using bedrails?: A Little Help needed moving to and from a bed to a chair (including a wheelchair)?: A Little Help needed standing up from a chair using your arms (e.g., wheelchair or bedside chair)?: A Little Help needed to walk in hospital room?: A Little Help needed climbing 3-5 steps with a railing? : A Lot 6 Click Score: 17    End of Session Equipment Utilized During Treatment: Oxygen Activity Tolerance: Patient limited by fatigue;Patient limited by lethargy Patient left: in bed;with call bell/phone within reach;with  family/visitor present Nurse Communication: Mobility status;Patient requests pain meds PT Visit Diagnosis: Other symptoms and signs involving the nervous system (R29.898);Difficulty in walking, not elsewhere classified (R26.2)    Time: 1000-1030 PT Time Calculation (min) (ACUTE ONLY): 30 min   Charges:   PT Evaluation $PT Eval Moderate Complexity: 1 Mod PT Treatments $Therapeutic Activity: 8-22 mins PT General Charges $$ ACUTE PT VISIT: 1 Visit       10:47 AM, 01/28/23 Rosamaria Lints, PT, DPT Physical Therapist - [facility]  [phone] (ASCOM)     Noha Milberger C 01/28/2023, 10:44 AM

## 2023-01-29 ENCOUNTER — Inpatient Hospital Stay: Payer: Medicare Other

## 2023-01-29 ENCOUNTER — Encounter: Payer: Self-pay | Admitting: Internal Medicine

## 2023-01-29 DIAGNOSIS — I2510 Atherosclerotic heart disease of native coronary artery without angina pectoris: Secondary | ICD-10-CM | POA: Diagnosis not present

## 2023-01-29 DIAGNOSIS — I6310 Cerebral infarction due to embolism of unspecified precerebral artery: Secondary | ICD-10-CM | POA: Diagnosis not present

## 2023-01-29 DIAGNOSIS — I6389 Other cerebral infarction: Secondary | ICD-10-CM | POA: Diagnosis not present

## 2023-01-29 DIAGNOSIS — Z01810 Encounter for preprocedural cardiovascular examination: Secondary | ICD-10-CM | POA: Diagnosis not present

## 2023-01-29 DIAGNOSIS — Z515 Encounter for palliative care: Secondary | ICD-10-CM

## 2023-01-29 DIAGNOSIS — Z7189 Other specified counseling: Secondary | ICD-10-CM | POA: Diagnosis not present

## 2023-01-29 DIAGNOSIS — I482 Chronic atrial fibrillation, unspecified: Secondary | ICD-10-CM | POA: Diagnosis not present

## 2023-01-29 DIAGNOSIS — I5032 Chronic diastolic (congestive) heart failure: Secondary | ICD-10-CM | POA: Diagnosis not present

## 2023-01-29 LAB — GLUCOSE, CAPILLARY
Glucose-Capillary: 115 mg/dL — ABNORMAL HIGH (ref 70–99)
Glucose-Capillary: 168 mg/dL — ABNORMAL HIGH (ref 70–99)
Glucose-Capillary: 140 mg/dL — ABNORMAL HIGH (ref 70–99)

## 2023-01-29 MED ORDER — ASPIRIN 81 MG PO TBEC
81.0000 mg | DELAYED_RELEASE_TABLET | Freq: Every day | ORAL | Status: DC
  Administered 2023-01-29 – 2023-02-02 (×5): 81 mg via ORAL
  Filled 2023-01-29 (×5): qty 1

## 2023-01-29 MED ORDER — IOHEXOL 350 MG/ML SOLN
75.0000 mL | Freq: Once | INTRAVENOUS | Status: AC | PRN
  Administered 2023-01-29: 75 mL via INTRAVENOUS

## 2023-01-29 NOTE — Progress Notes (Signed)
   01/29/23 1500  Spiritual Encounters  Type of Visit Initial  Care provided to: Family  Referral source Code page (Stroke)  Reason for visit Code  OnCall Visit Yes   Chaplain responded to code stroke. Patient is cared for by the interdisciplinary team and taken to CT.  Chaplain provides compassionate care to family at bedside.

## 2023-01-29 NOTE — Progress Notes (Signed)
PROGRESS NOTE    Brooke Davis  WUJ:[id number] DOB: [birth year] DOA: 01/27/2023 PCP: [name], MD    Brief Narrative:  82 y.o. female with medical history significant for type 2 diabetes mellitus, hypertension, dyslipidemia, coronary artery disease status post PCI and stent, diastolic dysfunction, and anxiety, who presented to the emergency room with acute onset of altered mental status with confusion after waking up from a nap at 3 PM.  She started her nap at her baseline around 1 PM.  She was having slurred speech and expressive aphasia as well as right arm weakness and neglect.  She just had a right hip surgery on 7/1.  She denies any paresthesias.  No weakness seizures.  No tinnitus or vertigo.  She has been having constipation and low back pain.  She denies any fever or chills.  No chest pain or palpitations.  No cough or wheezing.  No dysuria, oliguria or hematuria or flank pain.   Symptoms had initially improved the emergency room however patient had a deterioration in neurologic function early a.m. on 7/11.  Repeat head CT without significant interval change.  Case discussed with neurology.   Assessment & Plan:   Principal Problem:   Embolic stroke [facility]) Active Problems:   Chronic atrial fibrillation with RVR (HCC)   Chronic diastolic CHF (congestive heart failure) (HCC)   Dyslipidemia   Gout   Anxiety and depression   Right arm weakness   Acute CVA (cerebrovascular accident) (HCC)   Bilateral carotid artery stenosis  Acute CVA Bilateral carotid artery stenosis left greater than right Embolic appearing however distribution of infarct is with in the left carotid artery territory.  Raises suspicion for carotid artery stenosis as primary driving factor.  This is not necessarily being considered an Eliquis treatment failure. Echocardiogram reassuring.  Negative bubble Plan: DAPT aspirin Plavix High intensity statin PT OT ST Neurology follow-up Vascular consulted.  Plan  for outpatient evaluation for carotid stent versus endarterectomy   Chronic atrial fibrillation with RVR (HCC) PTA Eliquis and Lopressor   Chronic diastolic CHF (congestive heart failure) (HCC) Continue holding Demadex and Entresto Continue Lopressor Anticipate restarting home medications tomorrow 7/13   Anxiety and depression PTA Xanax and Celexa   Gout PTA allopurinol   Dyslipidemia High intensity statin  Functional decline Palliative care evaluation, pending   DVT prophylaxis: Apixaban Code Status: Full Family Communication: Daughter at bedside 7/11, 7/12 Disposition Plan: Status is: Inpatient Remains inpatient appropriate because: Acute CVA     Level of care: Telemetry Medical  Consultants:  Neurology  Procedures:  None  Antimicrobials: None   Subjective: Seen and examined.  Patient more awake this morning.  2 daughters and granddaughter at bedside.  Objective: Vitals:   01/29/23 0021 01/29/23 0524 01/29/23 0838 01/29/23 1203  BP: (!) 141/67 (!) 144/62 (!) 154/72 (!) 120/51  Pulse: 68 78 66 81  Resp:  20 16 16   Temp: 98.7 F (37.1 C) 99 F (37.2 C) 99.4 F (37.4 C) 99.9 F (37.7 C)  TempSrc:  Oral Oral Oral  SpO2: 98% 97%  100%  Weight:      Height:        Intake/Output Summary (Last 24 hours) at 01/29/2023 1248 Last data filed at 01/29/2023 1101 Gross per 24 hour  Intake 2975.24 ml  Output 1650 ml  Net 1325.24 ml   Filed Weights   01/27/23 1819 01/28/23 0100  Weight: 79.4 kg 87.6 kg    Examination:  General exam: More awake.  Mildly confused  Respiratory system: Lungs clear.  Normal Davis of breathing.  Room air Cardiovascular system: S1-S2, regular rate, irregular rhythm, 2/6 murmur Gastrointestinal system: Soft, NT/ND, normal bowel sounds Central nervous system: Alert.  Oriented x 2.  Right-sided weakness.  Right facial droop Extremities: RUE and RLE weakness Skin: No rashes, lesions or ulcers Psychiatry: Judgement and insight  appear impaired. Mood & affect confused.     Data Reviewed: I have personally reviewed following labs and imaging studies  CBC: Recent Labs  Lab 01/27/23 1827 01/28/23 0400  WBC 8.3 7.7  HGB 8.8* 8.6*  HCT 28.1* 27.0*  MCV 101.1* 101.1*  PLT 311 295   Basic Metabolic Panel: Recent Labs  Lab 01/27/23 1827 01/28/23 0400  NA 134* 136  K 4.5 4.5  CL 100 104  CO2 25 27  GLUCOSE 123* 124*  BUN 15 14  CREATININE 0.99 1.03*  CALCIUM 8.8* 8.7*   GFR: Estimated Creatinine Clearance: 45.5 mL/min (A) (by C-G formula based on SCr of 1.03 mg/dL (H)). Liver Function Tests: Recent Labs  Lab 01/27/23 1827  AST 23  ALT 18  ALKPHOS 98  BILITOT 0.7  PROT 6.3*  ALBUMIN 2.7*   Recent Labs  Lab 01/27/23 1827  LIPASE 34   No results for input(s): "AMMONIA" in the last 168 hours. Coagulation Profile: No results for input(s): "INR", "PROTIME" in the last 168 hours. Cardiac Enzymes: No results for input(s): "CKTOTAL", "CKMB", "CKMBINDEX", "TROPONINI" in the last 168 hours. BNP (last 3 results) No results for input(s): "PROBNP" in the last 8760 hours. HbA1C: No results for input(s): "HGBA1C" in the last 72 hours. CBG: Recent Labs  Lab 01/28/23 1109 01/28/23 1610 01/28/23 2028 01/29/23 0833 01/29/23 1205  GLUCAP 97 96 122* 115* 168*   Lipid Profile: Recent Labs    01/28/23 0400  CHOL 86  HDL 29*  LDLCALC 46  TRIG 55  CHOLHDL 3.0   Thyroid Function Tests: No results for input(s): "TSH", "T4TOTAL", "FREET4", "T3FREE", "THYROIDAB" in the last 72 hours. Anemia Panel: No results for input(s): "VITAMINB12", "FOLATE", "FERRITIN", "TIBC", "IRON", "RETICCTPCT" in the last 72 hours. Sepsis Labs: No results for input(s): "PROCALCITON", "LATICACIDVEN" in the last 168 hours.  No results found for this or any previous visit (from the past 240 hour(s)).       Radiology Studies: ECHOCARDIOGRAM COMPLETE BUBBLE STUDY  Result Date: 01/28/2023    ECHOCARDIOGRAM REPORT    Patient Name:   Brooke Davis [facility] Date of Exam: 01/28/2023 Medical Rec #:  [id number]        Height:       62.0 in Accession #:    [id number]       Weight:       193.1 lb Date of Birth:  1940/12/26        BSA:          1.883 m Patient Age:    81 years         BP:           112/61 mmHg Patient Gender: F                HR:           65 bpm. Exam Location:  [facility] Procedure: 2D Echo, Cardiac Doppler, Color Doppler and Saline Contrast Bubble            Study Indications:     TIA 435.9 / G45.9  History:         Patient has prior history of  Echocardiogram examinations, most                  recent 01/14/2021. Arrythmias:Atrial Fibrillation; Risk                  Factors:Hypertension.  Sonographer:     Cristela Blue Referring Phys:  [id number] JAN A MANSY Diagnosing Phys: Julien Nordmann MD  Sonographer Comments: Suboptimal apical window. IMPRESSIONS  1. Left ventricular ejection fraction, by estimation, is 60 to 65%. The left ventricle has normal function. The left ventricle has no regional wall motion abnormalities. Left ventricular diastolic parameters are indeterminate.  2. Right ventricular systolic function is normal. The right ventricular size is normal. There is normal pulmonary artery systolic pressure. The estimated right ventricular systolic pressure is 26.3 mmHg.  3. Left atrial size was moderately dilated.  4. Right atrial size was mildly dilated.  5. The mitral valve is normal in structure. Mild mitral valve regurgitation. No evidence of mitral stenosis.  6. Tricuspid valve regurgitation is mild to moderate.  7. The aortic valve is normal in structure. Aortic valve regurgitation is not visualized. Aortic valve sclerosis is present, with no evidence of aortic valve stenosis.  8. The inferior vena cava is normal in size with greater than 50% respiratory variability, suggesting right atrial pressure of 3 mmHg.  9. Agitated saline contrast bubble study was negative, with no evidence of any interatrial shunt. FINDINGS  Left  Ventricle: Left ventricular ejection fraction, by estimation, is 60 to 65%. The left ventricle has normal function. The left ventricle has no regional wall motion abnormalities. The left ventricular internal cavity size was normal in size. There is  no left ventricular hypertrophy. Left ventricular diastolic parameters are indeterminate. Right Ventricle: The right ventricular size is normal. No increase in right ventricular wall thickness. Right ventricular systolic function is normal. There is normal pulmonary artery systolic pressure. The tricuspid regurgitant velocity is 2.31 m/s, and  with an assumed right atrial pressure of 5 mmHg, the estimated right ventricular systolic pressure is 26.3 mmHg. Left Atrium: Left atrial size was moderately dilated. Right Atrium: Right atrial size was mildly dilated. Pericardium: There is no evidence of pericardial effusion. Mitral Valve: The mitral valve is normal in structure. There is mild calcification of the mitral valve leaflet(s). Mild mitral annular calcification. Mild mitral valve regurgitation. No evidence of mitral valve stenosis. Tricuspid Valve: The tricuspid valve is normal in structure. Tricuspid valve regurgitation is mild to moderate. No evidence of tricuspid stenosis. Aortic Valve: The aortic valve is normal in structure. Aortic valve regurgitation is not visualized. Aortic valve sclerosis is present, with no evidence of aortic valve stenosis. Aortic valve mean gradient measures 8.3 mmHg. Aortic valve peak gradient measures 15.6 mmHg. Aortic valve area, by VTI measures 1.66 cm. Pulmonic Valve: The pulmonic valve was normal in structure. Pulmonic valve regurgitation is not visualized. No evidence of pulmonic stenosis. Aorta: The aortic root is normal in size and structure. Venous: The inferior vena cava is normal in size with greater than 50% respiratory variability, suggesting right atrial pressure of 3 mmHg. IAS/Shunts: No atrial level shunt detected by color  flow Doppler. Agitated saline contrast was given intravenously to evaluate for intracardiac shunting. Agitated saline contrast bubble study was negative, with no evidence of any interatrial shunt. There  is no evidence of a patent foramen ovale. There is no evidence of an atrial septal defect.  LEFT VENTRICLE PLAX 2D LVIDd:         3.90 cm  Diastology LVIDs:         2.50 cm   LV e' medial:    8.16 cm/s LV PW:         0.80 cm   LV E/e' medial:  13.7 LV IVS:        1.40 cm   LV e' lateral:   11.10 cm/s LVOT diam:     2.00 cm   LV E/e' lateral: 10.1 LV SV:         67 LV SV Index:   36 LVOT Area:     3.14 cm  RIGHT VENTRICLE RV Basal diam:  3.70 cm RV Mid diam:    3.20 cm RV S prime:     13.10 cm/s TAPSE (M-mode): 1.3 cm LEFT ATRIUM             Index        RIGHT ATRIUM           Index LA diam:        4.10 cm 2.18 cm/m   RA Area:     22.60 cm LA Vol (A2C):   68.3 ml 36.26 ml/m  RA Volume:   74.90 ml  39.77 ml/m LA Vol (A4C):   82.3 ml 43.70 ml/m LA Biplane Vol: 80.7 ml 42.85 ml/m  AORTIC VALVE AV Area (Vmax):    1.49 cm AV Area (Vmean):   1.57 cm AV Area (VTI):     1.66 cm AV Vmax:           197.33 cm/s AV Vmean:          133.667 cm/s AV VTI:            0.406 m AV Peak Grad:      15.6 mmHg AV Mean Grad:      8.3 mmHg LVOT Vmax:         93.40 cm/s LVOT Vmean:        67.000 cm/s LVOT VTI:          0.214 m LVOT/AV VTI ratio: 0.53  AORTA Ao Root diam: 3.30 cm MITRAL VALVE                TRICUSPID VALVE MV Area (PHT): 4.26 cm     TR Peak grad:   21.3 mmHg MV Decel Time: 178 msec     TR Vmax:        231.00 cm/s MV E velocity: 112.00 cm/s                             SHUNTS                             Systemic VTI:  0.21 m                             Systemic Diam: 2.00 cm Julien Nordmann MD Electronically signed by Julien Nordmann MD Signature Date/Time: 01/28/2023/12:52:11 PM    Final    CT HEAD CODE STROKE WO CONTRAST`  Addendum Date: 01/28/2023   ADDENDUM REPORT: 01/28/2023 06:48 ADDENDUM: Study discussed by  telephone with Dr. Valente David on 01/28/2023 at 0642 hours. Electronically Signed   By: Odessa Fleming M.D.   On: 01/28/2023 06:48   Result Date: 01/28/2023 CLINICAL DATA:  Code stroke. 82 year old female with weakness and altered mental status, change in stroke symptoms.  Numerous scattered small foci of ischemia in the left hemisphere on MRI yesterday. EXAM: CT HEAD WITHOUT CONTRAST TECHNIQUE: Contiguous axial images were obtained from the base of the skull through the vertex without intravenous contrast. RADIATION DOSE REDUCTION: This exam was performed according to the departmental dose-optimization program which includes automated exposure control, adjustment of the mA and/or kV according to patient size and/or use of iterative reconstruction technique. COMPARISON:  Brain MRI 2317 hours yesterday. Head CT 1859 hours yesterday. FINDINGS: Brain: No acute intracranial hemorrhage identified. No midline shift, mass effect, or evidence of intracranial mass lesion. Stable ventricle size and configuration. Patchy bilateral white matter hypodensity is stable. The multiple small foci of abnormal diffusion scattered in the left hemisphere on MRI last night are largely occult by CT. Stable gray-white matter differentiation throughout the brain. No new cortically based infarct identified. Vascular: No suspicious intracranial vascular hyperdensity. Calcified atherosclerosis at the skull base. Skull: No acute osseous abnormality identified. Mild motion artifact. Sinuses/Orbits: Visualized paranasal sinuses and mastoids are stable and well aerated. Other: No gaze deviation, acute orbit or scalp soft tissue finding. ASPECTS [facility] Stroke Program Early CT Score) Total score (0-10 with 10 being normal): 10 IMPRESSION: Stable noncontrast CT appearance of the brain since yesterday, the multiple small infarcts by MRI are largely occult by CT. No new intracranial abnormality. Electronically Signed: By: Odessa Fleming M.D. On: 01/28/2023 06:37    MR BRAIN WO CONTRAST  Result Date: 01/28/2023 CLINICAL DATA: Confusion, right upper extremity weakness EXAM: MRI HEAD WITHOUT CONTRAST TECHNIQUE: Multiplanar, multiecho pulse sequences of the brain and surrounding structures were obtained without intravenous contrast. COMPARISON:  No prior MRI available, correlation is made with 01/27/2023 CT head and CTA head and neck FINDINGS: Evaluation is significantly limited by motion, with the exception of the diffusion-weighted sequence. Brain: Scattered foci of cortical and white matter restricted diffusion with ADC correlates in the left cerebral hemisphere and left caudate (series 5, images 20-38). Evaluation for T2 hyperintense correlate is limited by motion artifact No definite acute hemorrhage, mass, mass effect, or midline shift. No hydrocephalus or extra-axial collection. Vascular: As seen on the prior CTA, the right V4 flow void is not well seen. Otherwise normal arterial flow voids. Skull and upper cervical spine: Grossly normal marrow signal. Sinuses/Orbits: Clear paranasal sinuses. Status post bilateral lens replacements. Other: The mastoid air cells are well aerated. IMPRESSION: Evaluation is significantly limited by motion, with the exception of the diffusion-weighted sequence. 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 These results were called by telephone at the time of interpretation on 01/28/2023 at 1:05 am to provider [facility] , who verbally acknowledged these results. Electronically Signed   By: Wiliam Ke M.D.   On: 01/28/2023 01:05   CT ANGIO HEAD NECK W WO CM  Result Date: 01/27/2023 CLINICAL DATA:  Confusion and right upper extremity weakness EXAM: CT ANGIOGRAPHY HEAD AND NECK WITH AND WITHOUT CONTRAST TECHNIQUE: Multidetector CT imaging of the head and neck was performed using the standard protocol during bolus administration of intravenous contrast.  Multiplanar CT image reconstructions and MIPs were obtained to evaluate the vascular anatomy. Carotid stenosis measurements (when applicable) are obtained utilizing NASCET criteria, using the distal internal carotid diameter as the denominator. RADIATION DOSE REDUCTION: This exam was performed according to the departmental dose-optimization program which includes automated exposure control, adjustment of the mA and/or kV according to patient size and/or use of iterative reconstruction technique.  CONTRAST:  75mL OMNIPAQUE IOHEXOL 350 MG/ML SOLN COMPARISON:  No prior CTA available, correlation is made with CT head 01/27/2023 FINDINGS: CT HEAD FINDINGS For noncontrast findings, please see same day CT head. CTA NECK FINDINGS Aortic arch: Evaluation of the arch is somewhat limited by motion. Within this limitation, Two-vessel arch with a common origin of the brachiocephalic and left common carotid arteries. Imaged portion shows no evidence of aneurysm or dissection. No significant stenosis of the major arch vessel origins. Mild aortic atherosclerosis. Right carotid system: 60% stenosis in the proximal right ICA (series 6, image 194). Additional atherosclerotic in the right common carotid artery is not hemodynamically significant. Left carotid system: 70% stenosis in the proximal left ICA (series 6, image 199). Additional atherosclerotic disease in the common carotid artery is not hemodynamically significant. Vertebral arteries: Left dominant system. The left vertebral artery is patent from its origin to the skull base without significant stenosis or evidence of dissection. The right vertebral artery is quite diminutive, and poorly perfused in the distal V3 segment (series 6, image 159), likely reflecting moderate stenosis. Skeleton: No acute osseous abnormality. Degenerative changes in the cervical spine. Other neck: Hypoenhancing and calcified nodules in the thyroid, the largest of which measures up to 6 mm, for which  no follow-up is currently indicated. (Reference: J Am Coll Radiol. 2015 Feb;12(2): 143-50) Upper chest: No focal pulmonary opacity or pleural effusion. Mild centrilobular emphysema. Review of the MIP images confirms the above findings CTA HEAD FINDINGS Anterior circulation: Both internal carotid arteries are patent to the termini, with mild stenosis in the bilateral cavernous and supraclinoid segments. A1 segments patent. Normal anterior communicating artery. Anterior cerebral arteries are patent to their distal aspects without significant stenosis. No M1 stenosis or occlusion. MCA branches are irregular but perfused to their distal aspects without significant stenosis. Posterior circulation: Nonopacification of the proximal right V4 (series 6, image 146), consistent with occlusion. The more distal right V4 is opacified, likely retrograde. The left vertebral artery is patent to the vertebrobasilar junction with mild stenosis in the proximal V4 (series 6, image 141). Posteroinferior cerebellar arteries are not definitively visualized. Basilar patent to its distal aspect without significant stenosis. Superior cerebellar arteries patent proximally. Patent P1 segments. Mild stenosis in the proximal right P2 (series 6, image 99), and mid left P 2 (series 6, image 105). The remainder of the PCAs are irregular but do not demonstrate significant stenosis. The right posterior communicating artery is patent. The left is not definitively seen. Venous sinuses: As permitted by contrast timing, patent. Anatomic variants: None significant. Review of the MIP images confirms the above findings IMPRESSION: 1. 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 This is of indeterminate acuity. 2. 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 3. Mild stenosis in the bilateral cavernous and supraclinoid ICAs. 4. Mild stenosis in the proximal right P2  and mid left P2. 5. Aortic atherosclerosis. 6. Emphysema. Aortic Atherosclerosis (ICD10-I70.0) and Emphysema (ICD10-J43.9). These results were called by telephone at the time of interpretation on 01/27/2023 at 10:07 pm to provider [facility] , who verbally acknowledged these results. Electronically Signed   By: Wiliam Ke M.D.   On: 01/27/2023 22:07   CT Head Wo Contrast  Result Date: 01/27/2023 CLINICAL DATA:  Confusion. EXAM: CT HEAD WITHOUT CONTRAST TECHNIQUE: Contiguous axial images were obtained from the base of the skull through the vertex without intravenous contrast. RADIATION  DOSE REDUCTION: This exam was performed according to the departmental dose-optimization program which includes automated exposure control, adjustment of the mA and/or kV according to patient size and/or use of iterative reconstruction technique. COMPARISON:  Head CT dated 10/03/2021. FINDINGS: Brain: Mild age-related atrophy and moderate chronic microvascular ischemic changes. There is no acute intracranial hemorrhage. No mass effect or midline shift. No extra-axial fluid collection. Vascular: No hyperdense vessel or unexpected calcification. Skull: Normal. Negative for fracture or focal lesion. Sinuses/Orbits: No acute finding. Other: None IMPRESSION: 1. No acute intracranial pathology. 2. Mild age-related atrophy and moderate chronic microvascular ischemic changes. Electronically Signed   By: Elgie Collard M.D.   On: 01/27/2023 19:14        Scheduled Meds:  allopurinol  300 mg Oral Daily   apixaban  5 mg Oral BID   aspirin EC  81 mg Oral Daily   atorvastatin  80 mg Oral Daily   citalopram  10 mg Oral Daily   clopidogrel  75 mg Oral Daily   feeding supplement  237 mL Oral BID BM   metoprolol tartrate  25 mg Oral BID   potassium chloride  10 mEq Oral Daily   Continuous Infusions:     LOS: 1 day    Tresa Moore, MD [facility]   If 7PM-7AM, please contact night-coverage  01/29/2023,  12:48 PM

## 2023-01-29 NOTE — Progress Notes (Signed)
? ?  Inpatient Rehab Admissions Coordinator : ? ?Per therapy recommendations, patient was screened for CIR candidacy by Alexxus Sobh RN MSN.  At this time patient appears to be a potential candidate for CIR. I will place a rehab consult per protocol for full assessment. Please call me with any questions. ? ?Marye Eagen RN MSN ?Admissions Coordinator ?[phone] ?  ?

## 2023-01-29 NOTE — Progress Notes (Signed)
Primary RN was called in patient room by family; patient was more slurred than usual and had a right facial droop more pronounced than usual; code stroke was called; patient taken to CT accompanied by RN and responding neurologist.

## 2023-01-29 NOTE — TOC Progression Note (Signed)
Transition of Care [facility]) - Progression Note    Patient Details  Name: Brooke Davis MRN: [id number] Date of Birth: November 10, 1940  [facility]) CM/SW Contact  Allena Katz, LCSW Phone Number: 01/29/2023, 2:45 PM  Clinical Narrative:   CIR evaluating.          Expected Discharge Plan and Services                                               Social Determinants of Health (SDOH) Interventions SDOH Screenings   Food Insecurity: No Food Insecurity (01/28/2023)  Housing: Low Risk  (01/28/2023)  Transportation Needs: No Transportation Needs (01/28/2023)  Utilities: Not At Risk (01/28/2023)  Financial Resource Strain: Patient Declined (11/05/2022)   Received from [facility], [facility]  Tobacco Use: Medium Risk (01/28/2023)    Readmission Risk Interventions     No data to display

## 2023-01-29 NOTE — Progress Notes (Signed)
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 Of note, improved RUE return to function noted on this date. Pt requires MIN A +2 for STS, short amb transfer to bedside chair. Poor recall of hip precautions. Discussion with pt and daughter re: importance of continued rehab and rehab continuum. Pt is left in chair, all needs met. NT alerted of pt status. OT will continue to follow acutely.    Of note, ?drainage noted from L hip dressing, PT notified team.    Recommendations for follow up therapy are one component of a multi-disciplinary discharge planning process, led by the attending physician.  Recommendations may be updated based on patient status, additional functional criteria and insurance authorization.    Assistance Recommended at Discharge Frequent or constant Supervision/Assistance  Patient can return home with the following  A lot of help with walking and/or transfers;A lot of help with bathing/dressing/bathroom;Assistance with cooking/housework;Direct supervision/assist for medications management;Direct supervision/assist for financial management;Help with stairs or ramp for entrance   Equipment Recommendations  Other (comment) (per next venue of care)    Recommendations for Other Services      Precautions / Restrictions Precautions Precautions: Posterior  Hip;Fall Precaution Comments: R hemi Restrictions Weight Bearing Restrictions: Yes RLE Weight Bearing: Weight bearing as tolerated       Mobility Bed Mobility Overal bed mobility: Needs Assistance Bed Mobility: Supine to Sit, Sit to Supine     Supine to sit: Mod assist     General bed mobility comments: step by step vcs    Transfers                         Balance Overall balance assessment: Needs assistance Sitting-balance support: Feet supported Sitting balance-Leahy Scale: Fair     Standing balance support: Bilateral upper extremity supported, Reliant on assistive device for balance, During functional activity Standing balance-Leahy Scale: Fair                             ADL either performed or assessed with clinical judgement   ADL Overall ADL's : Needs assistance/impaired                     Lower Body Dressing: Maximal assistance;Cueing for sequencing   Toilet Transfer: Minimal assistance;+2 for physical assistance;+2 for safety/equipment Toilet Transfer Details (indicate cue type and reason): step pivot Toileting- Clothing Manipulation and Hygiene: Maximal assistance              Extremity/Trunk Assessment Upper Extremity Assessment RUE Deficits / Details: return of elbow flexion AROM approx 3/4 full noted on this date, wrist 3/4 full AROM; shoulder approx            Vision       Perception     Praxis      Cognition Arousal/Alertness: Awake/alert Behavior During Therapy: Flat affect Overall Cognitive Status: Impaired/Different from  baseline Area of Impairment: Orientation, Attention, Memory, Following commands, Safety/judgement, Awareness, Problem solving                 Orientation Level: Disoriented to, Time, Situation Current Attention Level: Focused Memory: Decreased short-term memory, Decreased recall of precautions Following Commands: Follows one step commands with increased  time Safety/Judgement: Decreased awareness of safety, Decreased awareness of deficits Awareness: Emergent Problem Solving: Slow processing, Decreased initiation, Difficulty sequencing, Requires verbal cues, Requires tactile cues General Comments: poor awareness of current level of functioning, safe dc plan        Exercises      Shoulder Instructions       General Comments vss throughout    Pertinent Vitals/ Pain       Pain Assessment Pain Assessment: No/denies pain  Home Living                                          Prior Functioning/Environment              Frequency  Min 1X/week        Progress Toward Goals  OT Goals(current goals can now be found in the care plan section)  Progress towards OT goals: Progressing toward goals     Plan Discharge plan needs to be updated    Co-evaluation    PT/OT/SLP Co-Evaluation/Treatment: Yes Reason for Co-Treatment: Necessary to address cognition/behavior during functional activity;To address functional/ADL transfers;For patient/therapist safety;Complexity of the patient's impairments (multi-system involvement)   OT goals addressed during session: ADL's and self-care      AM-PAC OT "6 Clicks" Daily Activity     Outcome Measure   Help from another person eating meals?: A Little Help from another person taking care of personal grooming?: A Lot Help from another person toileting, which includes using toliet, bedpan, or urinal?: A Lot Help from another person bathing (including washing, rinsing, drying)?: A Lot Help from another person to put on and taking off regular upper body clothing?: A Lot Help from another person to put on and taking off regular lower body clothing?: A Lot 6 Click Score: 13    End of Session Equipment Utilized During Treatment: Rolling walker (2 wheels);Oxygen  OT Visit Diagnosis: Other abnormalities of gait and mobility (R26.89);Other symptoms and signs involving  cognitive function;Other symptoms and signs involving the nervous system (R29.898);Hemiplegia and hemiparesis Hemiplegia - Right/Left: Right Hemiplegia - dominant/non-dominant: Non-Dominant Hemiplegia - caused by: Cerebral infarction   Activity Tolerance Patient tolerated treatment well   Patient Left in chair;with call bell/phone within reach   Nurse Communication Mobility status        Time: 1328-1400 OT Time Calculation (min): 32 min  Charges: OT General Charges $OT Visit: 1 Visit OT Treatments $Therapeutic Activity: 8-22 mins  Oleta Mouse, OTD OTR/L  01/29/23, 2:13 PM

## 2023-01-29 NOTE — Consult Note (Signed)
Consultation Note Date: 01/29/2023   Patient Name: Brooke Davis  DOB: 10/24/1940  MRN: [id number]  Age / Sex: 82 y.o., female  PCP: [name], MD Referring Physician: Tresa Moore, MD  Reason for Consultation: Establishing goals of care   HPI/Brief Hospital Course: 82 y.o. female  with past medical history of T2DM, HTN, HLD, CAD s/p PCI and stent, diastolic dysfunction and anxiety admitted from home on 01/27/2023 with acute onset altered mental status, slurred speech, expressive aphasia and right arm weakness.   Noted elective total right hip arthroplasty 01/18/2023-reportedly recovering well at home after surgery  Palliative medicine was consulted for assisting with goals of care conversations  Subjective:  Extensive chart review has been completed prior to meeting patient including labs, vital signs, imaging, progress notes, orders, and available advanced directive documents from current and previous encounters.  Visited with Ms. Agostino at her bedside. Awake and alert, able to engage in goals of care conversations. Remains slightly confused surrounding current situation some difficulty with word finding remains. 2 daughters at bedside during time of visit.  Introduced myself as a Publishing rights manager as a member of the palliative care team. Explained palliative medicine is specialized medical care for people living with serious illness. It focuses on providing relief from the symptoms and stress of a serious illness. The goal is to improve quality of life for both the patient and the family.   Ms. Cahan and daughters able to share a brief life review. Ms. Llaguno lives in her home with her nephew and his children. Prior to admission, at baseline she is fairly independent. She lost her husband in November after an extended illness, she continues to grieve her husband and is adjusting to life without him. She shares she is frustrated with the  complications since hip surgery, referring to new stroke.  Daughters able to share their understanding of risk of stroke possibly from carotid artery stenosis versus underlying atrial fibrillation. Aware Ms. Starr is being evaluated by vascular surgery for possible intervention for stenosis. Concern expressed for Ms. Turnbough undergoing another procedure/intervention while she continues to recover from hip replacement and stroke.  Attempted to elicit goals of care. Ms. Traughber has not completed Advanced Directive documents in the past. She had 4 daughters, unsure if she would want to appoint a specific HCPOA or if she would want decision making to be joint between her daughters. She is interested in completing Living Will document. We discussed Code Status-Full Code versus Do Not Resuscitate. Encouraged Ms. Dugger to consider DNR/DNI status understanding evidenced based poor outcomes in similar hospitalized patients, as the cause of the arrest is likely associated with chronic/terminal disease rather than a reversible acute cardio-pulmonary event.   She shares she is familiar with this as she had conversations with her husband regarding his wishes. She is clear in stating she wishes for resuscitation at this time. Encouraged ongoing conversations with family regarding her wishes.  Advanced Directive packet left at bedside for review. Will consult Chaplain services to assist with completion.  Daughters express concern of clear drainage that has saturated multiple dressings after hip surgery at home and while in hospital. Concern passed on to primary team.  I discussed importance of continued conversations with family/support persons and all members of their medical team regarding overall plan of care and treatment options ensuring decisions are in alignment with patients goals of care.  All questions/concerns addressed. Emotional support provided to patient/family/support persons. PMT will continue to  follow and support  patient as needed.  Objective: Primary Diagnoses: Present on Admission:  Acute CVA (cerebrovascular accident) (HCC)   Vital Signs: BP (!) 120/51 (BP Location: Left Arm)   Pulse 81   Temp 99.9 F (37.7 C) (Oral)   Resp 16   Ht 5\' 2"  (1.575 m)   Wt 87.6 kg   SpO2 100%   BMI 35.32 kg/m  Pain Scale: 0-10   Pain Score: 0-No pain   IO: Intake/output summary:  Intake/Output Summary (Last 24 hours) at 01/29/2023 1540 Last data filed at 01/29/2023 1101 Gross per 24 hour  Intake 1357.99 ml  Output 1650 ml  Net -292.01 ml    LBM:   Baseline Weight: Weight: 79.4 kg Most recent weight: Weight: 87.6 kg      Assessment and Plan  SUMMARY OF RECOMMENDATIONS   Full Code Recommend completion of AD Ongoing GOC needed PMT to continue to follow for ongoing needs and support  Discussed With: Primary team   Thank you for this consult and allowing Palliative Medicine to participate in the care of Akari H. Torre. Palliative medicine will continue to follow and assist as needed.   Time Total: 75 minutes  Time spent includes: Detailed review of medical records (labs, imaging, vital signs), medically appropriate exam (mental status, respiratory, cardiac, skin), discussed with treatment team, counseling and educating patient, family and staff, documenting clinical information, medication management and coordination of care.   Signed by: Leeanne Deed, DNP, AGNP-C Palliative Medicine    Please contact Palliative Medicine Team phone at [phone] for questions and concerns.  For individual provider: See Loretha Stapler

## 2023-01-29 NOTE — Progress Notes (Signed)
SLP Cancellation Note  Patient Details Name: Brooke Davis MRN: [id number] DOB: 11-11-1940   Cancelled treatment:       Reason Eval/Treat Not Completed: Other (comment)  Pt with therapy. Will re-attempt at later date/time.  Suzi Hernan 01/29/2023, 2:22 PM

## 2023-01-29 NOTE — Progress Notes (Signed)
Subjective: I responded to a code stroke earlier, at the time it was reported that the patient had had worsening of her right-sided facial weakness and aphasia.  Blood pressure was in the 130s, not significantly decreased from previously, and glucose was in the 120s.  Exam: Vitals:   01/29/23 2028 01/29/23 2028  BP:    Pulse: 75 77  Resp:    Temp:    SpO2: 98% 99%   Gen: In bed, NAD Resp: non-labored breathing, no acute distress Abd: soft, nt  Neuro: MS: Awake, alert, she is able to name fingers and watch/strap, but does appear to be slightly more slow to answer questions than she had been previously. CN: She does have a right facial weakness Motor: 4/5 in the right arm and unable to lift the right leg against gravity Sensory: Intact to light touch  Pertinent Labs: Glucose in the 120s  Impression: 82 year old female with recent embolic appearing strokes on the left who had transient worsening of her symptoms.  It is possible that she had recurrent embolus, despite being on dual antiplatelet therapy and apixaban.  It is also possible that this represents fluctuation of her underlying stroke symptoms.  I would favor discontinuing Plavix despite this event and continuing apixaban plus aspirin.  I will repeat the MRI, if it appears she is continue to embolize, we may need to consider moving forward with some type of definitive therapy for her carotid stenosis.  This would require significant discussions given the patient's resistance to any type of procedure.  Recommendations: 1) MRI brain 2) continue apixaban 5 mg twice daily and aspirin 81 mg daily 3) neurology will continue to follow   Ritta Slot, MD [facility] [phone]  If 7pm- 7am, please page neurology on call as listed in [facility].

## 2023-01-29 NOTE — Plan of Care (Signed)
  Problem: Activity: Goal: Ability to avoid complications of mobility impairment will improve Outcome: Progressing Goal: Ability to tolerate increased activity will improve Outcome: Progressing   

## 2023-01-29 NOTE — Progress Notes (Signed)
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 Pt progresses to performing multiple STS with min<>mod assist +2, step pivot bed>recliner with RW & min assist +2. Pt tolerates sitting EOB ~10 minutes without LOB. PT & OT educate pt & daughter on recommendation of post acute rehab with daughter hoping pt can be as independent as possible upon return home (daughter & pt's nephew can provide significant supervision at d/c). Will continue to follow pt acutely to progress bed mobility, transfers & gait as able.     Assistance Recommended at Discharge Frequent or constant Supervision/Assistance  If plan is discharge home, recommend the following:  Can travel by private vehicle    A lot of help with walking and/or transfers;A lot of help with bathing/dressing/bathroom;Assist for transportation;Direct supervision/assist for financial management;Assistance with cooking/housework;Assistance with feeding;Direct supervision/assist for medications management;Help with stairs or ramp for entrance   No  Equipment Recommendations  None recommended by PT (TBD in next venue)    Recommendations for Other Services       Precautions / Restrictions Precautions Precautions: Posterior Hip;Fall Precaution Comments: R hemi Restrictions Weight Bearing Restrictions: Yes RLE Weight Bearing: Weight bearing as tolerated     Mobility  Bed  Mobility Overal bed mobility: Needs Assistance Bed Mobility: Supine to Sit     Supine to sit: Mod assist, HOB elevated (use of bed rails)          Transfers Overall transfer level: Needs assistance Equipment used: Rolling walker (2 wheels) Transfers: Sit to/from Stand, Bed to chair/wheelchair/BSC Sit to Stand: Min assist, +2 physical assistance, Mod assist   Step pivot transfers: Min assist, +2 safety/equipment (Step by Step cuing for sequencing & turning, pt requires some assistance for RW management, cuing to fully turn to seat)       General transfer comment: PT provides cuing for hand placement & sequencing but pt still places BUE on RW during STS. PT assists pt with maintaining R hand on RW 2/2 decreased strength. Pt is able to power up to standing with min assist, increase to mod assist on last attempt 2/2 fatigue.    Ambulation/Gait                   Stairs             Wheelchair Mobility     Tilt Bed    Modified Rankin (Stroke Patients Only)       Balance Overall balance assessment: Needs assistance Sitting-balance support: Feet supported Sitting balance-Leahy Scale: Fair Sitting balance - Comments: supervision static sitting EOB   Standing balance support: Bilateral upper extremity supported, Reliant on assistive device for balance, During functional activity Standing balance-Leahy Scale: Poor                              Cognition Arousal/Alertness: Awake/alert Behavior During Therapy: Flat affect Overall Cognitive Status: Impaired/Different from baseline Area of Impairment: Orientation,  Attention, Memory, Following commands, Safety/judgement, Awareness, Problem solving                 Orientation Level: Disoriented to, Time, Situation Current Attention Level: Focused Memory: Decreased short-term memory, Decreased recall of precautions Following Commands: Follows one step commands consistently, Follows one step  commands with increased time Safety/Judgement: Decreased awareness of safety, Decreased awareness of deficits Awareness: Emergent Problem Solving: Slow processing, Decreased initiation, Difficulty sequencing, Requires verbal cues, Requires tactile cues General Comments: Pt with flat affect, requires encouragement for participation, education.        Exercises      General Comments General comments (skin integrity, edema, etc.): Pt with wet gown & chuck pad, thought to possibly be from R hip dressing; PT messaged ortho MD who performed pt's sx (Dr. Ernest Pine) to inform him of this & pt's family's concern re: drainage.      Pertinent Vitals/Pain Pain Assessment Pain Assessment: Faces Faces Pain Scale: No hurt    Home Living                          Prior Function            PT Goals (current goals can now be found in the care plan section) Acute Rehab PT Goals Patient Stated Goal: to return to home PT Goal Formulation: With patient/family Time For Goal Achievement: 02/12/23 Potential to Achieve Goals: Good Progress towards PT goals: Progressing toward goals    Frequency    Min 1X/week      PT Plan Discharge plan needs to be updated (notified ortho MD of family's concern of drainage from R hip dressing)    Co-evaluation PT/OT/SLP Co-Evaluation/Treatment: Yes Reason for Co-Treatment: Necessary to address cognition/behavior during functional activity;To address functional/ADL transfers;For patient/therapist safety;Complexity of the patient's impairments (multi-system involvement) PT goals addressed during session: Mobility/safety with mobility;Proper use of DME;Balance OT goals addressed during session: ADL's and self-care      AM-PAC PT "6 Clicks" Mobility   Outcome Measure  Help needed turning from your back to your side while in a flat bed without using bedrails?: A Little Help needed moving from lying on your back to sitting on the side of a flat bed  without using bedrails?: A Lot Help needed moving to and from a bed to a chair (including a wheelchair)?: A Lot Help needed standing up from a chair using your arms (e.g., wheelchair or bedside chair)?: A Lot Help needed to walk in hospital room?: A Lot Help needed climbing 3-5 steps with a railing? : Total 6 Click Score: 12    End of Session Equipment Utilized During Treatment: Oxygen Activity Tolerance: Patient limited by fatigue Patient left: in chair;with chair alarm set;with call bell/phone within reach;with family/visitor present Nurse Communication: Mobility status PT Visit Diagnosis: Other symptoms and signs involving the nervous system (R29.898);Difficulty in walking, not elsewhere classified (R26.2);Muscle weakness (generalized) (M62.81);Hemiplegia and hemiparesis;Unsteadiness on feet (R26.81);Other abnormalities of gait and mobility (R26.89) Hemiplegia - Right/Left: Right Hemiplegia - dominant/non-dominant: Dominant Hemiplegia - caused by: Cerebral infarction     Time: 1339-1401 PT Time Calculation (min) (ACUTE ONLY): 22 min  Charges:    $Therapeutic Activity: 8-22 mins PT General Charges $$ ACUTE PT VISIT: 1 Visit                     Aleda Grana, PT, DPT 01/29/23, 2:24 PM   Sandi Mariscal 01/29/2023, 2:22 PM

## 2023-01-29 NOTE — [place] FL2 (Signed)
MEDICAID FL2 LEVEL OF CARE FORM     IDENTIFICATION  Patient Name: ALAJA Davis Birthdate: [birth year] Sex: female Admission Date (Current Location): 01/27/2023  [facility] and [place] Number:  Chiropodist and Address:  [facility], [address] [place], [place] [address]      Provider Number: [id number]  Attending Physician Name and Address:  Tresa Moore, MD  Relative Name and Phone Number:  Bjorn Loser (Daughter)  [phone]    Current Level of Care: Hospital Recommended Level of Care: Skilled Nursing Facility Prior Approval Number:    Date Approved/Denied:   PASRR Number: [id number]  Discharge Plan:      Current Diagnoses: Patient Active Problem List   Diagnosis Date Noted   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   Chronic atrial fibrillation with RVR (HCC) 01/28/2023   Dyslipidemia 01/28/2023   Chronic diastolic CHF (congestive heart failure) (HCC) 01/28/2023   Gout 01/28/2023   Anxiety and depression 01/28/2023   Right arm weakness 01/28/2023   Acute CVA (cerebrovascular accident) (HCC) 01/28/2023   Bilateral carotid artery stenosis 01/28/2023   Hx of total hip arthroplasty, right 01/18/2023   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   Slurred speech 09/23/2021   STEMI involving oth coronary artery of inferior wall (HCC) 01/11/2021   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) 01/11/2021   Controlled type 2 diabetes mellitus with hyperglycemia, without long-term current use of insulin (HCC) 01/11/2021   Pure hypercholesterolemia 01/11/2021   Hypercalcemia 01/11/2021   DDD (degenerative disc disease), lumbar 12/01/2013   Lumbar radiculitis 12/01/2013   Atrial fibrillation, transient (HCC) 11/10/2013   Sinoatrial node dysfunction (HCC) 11/10/2013   Primary hypertension 11/10/2013   Poorly-controlled hypertension 11/10/2013    Orientation RESPIRATION  BLADDER Height & Weight     Self, Place  O2 Continent Weight: 193 lb 2 oz (87.6 kg) Height:  5\' 2"  (157.5 cm)  BEHAVIORAL SYMPTOMS/MOOD NEUROLOGICAL BOWEL NUTRITION STATUS      Continent Diet  AMBULATORY STATUS COMMUNICATION OF NEEDS Skin   Extensive Assist Verbally  (Incision on R thigh)                       Personal Care Assistance Level of Assistance  Feeding, Bathing, Dressing Bathing Assistance: Maximum assistance Feeding assistance: Limited assistance Dressing Assistance: Maximum assistance     Functional Limitations Info  Sight, Hearing, Speech Sight Info: Adequate Hearing Info: Adequate Speech Info: Adequate    SPECIAL CARE FACTORS FREQUENCY  PT (By licensed PT), OT (By licensed OT)     PT Frequency: 5 times a week OT Frequency: 5 times a week            Contractures Contractures Info: Not present    Additional Factors Info  Code Status, Allergies Code Status Info: FULL Allergies Info: Celebrex (Celecoxib)  Penicillins  Relafen (Nabumetone)  Lovastatin           Current Medications (01/29/2023):  This is the current hospital active medication list Current Facility-Administered Medications  Medication Dose Route Frequency Provider Last Rate Last Admin   acetaminophen (TYLENOL) tablet 650 mg  650 mg Oral Q6H PRN Mansy, Jan A, MD       Or   acetaminophen (TYLENOL) suppository 650 mg  650 mg Rectal Q6H PRN Mansy, Jan A, MD       allopurinol (ZYLOPRIM) tablet 300 mg  300 mg Oral Daily Mansy, Vernetta Honey, MD  300 mg at 01/29/23 0848   ALPRAZolam (XANAX) tablet 0.25 mg  0.25 mg Oral BID PRN Mansy, Jan A, MD       apixaban Everlene Balls) tablet 5 mg  5 mg Oral BID Mansy, Jan A, MD   5 mg at 01/29/23 0847   aspirin EC tablet 81 mg  81 mg Oral Daily Mansy, Jan A, MD   81 mg at 01/29/23 0848   atorvastatin (LIPITOR) tablet 80 mg  80 mg Oral Daily Mansy, Jan A, MD   80 mg at 01/29/23 0847   citalopram (CELEXA) tablet 10 mg  10 mg Oral Daily Mansy, Jan A, MD   10 mg at  01/29/23 0847   clopidogrel (PLAVIX) tablet 75 mg  75 mg Oral Daily Mansy, Jan A, MD   75 mg at 01/29/23 0847   feeding supplement (ENSURE ENLIVE / ENSURE PLUS) liquid 237 mL  237 mL Oral BID BM Sreenath, Sudheer B, MD   237 mL at 01/29/23 1322   magnesium hydroxide (MILK OF MAGNESIA) suspension 30 mL  30 mL Oral Daily PRN Mansy, Jan A, MD       metoprolol tartrate (LOPRESSOR) tablet 25 mg  25 mg Oral BID Mansy, Jan A, MD   25 mg at 01/29/23 0847   ondansetron (ZOFRAN) tablet 4 mg  4 mg Oral Q6H PRN Mansy, Jan A, MD       Or   ondansetron [facility]) injection 4 mg  4 mg Intravenous Q6H PRN Mansy, Jan A, MD   4 mg at 01/29/23 1610   oxyCODONE (Oxy IR/ROXICODONE) immediate release tablet 5 mg  5 mg Oral Q4H PRN Mansy, Jan A, MD   5 mg at 01/27/23 2214   potassium chloride (KLOR-CON M) CR tablet 10 mEq  10 mEq Oral Daily Mansy, Jan A, MD   10 mEq at 01/29/23 0848   traMADol (ULTRAM) tablet 50-100 mg  50-100 mg Oral Q4H PRN Mansy, Jan A, MD   50 mg at 01/28/23 0909   traZODone (DESYREL) tablet 25 mg  25 mg Oral QHS PRN Mansy, Jan A, MD   25 mg at 01/27/23 2214     Discharge Medications: Please see discharge summary for a list of discharge medications.  Relevant Imaging Results:  Relevant Lab Results:   Additional Information SS [id number]  Allena Katz, LCSW

## 2023-01-29 NOTE — Consult Note (Addendum)
Brooke Davis is a 82 y.o. female  [id number]  Primary Cardiologist: [facility] cardiology Reason for Consultation: Preoperative clearance for possible vascular surgery versus stenting of the carotid.  HPI: This is a 82 year old white female with past medical history of PCI and stenting and history of MI presented to the hospital increased speech difficulty with MRI showing multifocal ischemia in the left internal carotid artery distribution.  She was found to have stroke possibly due to either atrial fibrillation or carotid disease.  On July 1 she underwent hip surgery and had to hold Eliquis for 3 days prior to surgery and could have been due to atrial fibrillation or could have been due to carotid disease.  Right now she denies any dizziness chest pain shortness of breath and speech is significantly improved with some right-sided residual weakness.  I was asked to evaluate the patient prior to vascular surgery requiring stenting in the carotid or endarterectomy.   Review of Systems: No chest pain or shortness of breath   Past Medical History:  Diagnosis Date   Anxiety    a.) on BZO (alprazolam) PRN   Arthritis    fingers, right hip   Atrial fibrillation (HCC) 11/10/2013   a.) CHA2DS2VASc = 5 (age x2, sex, HTN, T2DM);  b.) rate/rhythm maintained on oral metoprolol tartrate; chronically anticoagulated with apixaban   Bronchitis    Cerebrovascular small vessel disease (chronic)    Coronary artery disease involving native coronary artery of native heart 01/11/2021   a.) LHC 01/11/2021: 40% oLM, 50% mLCx, 30% oLCx, 30% pLAD, 30% mLAD, 99% pRCA (2.75 x 30 mm Resolute Onyx DES), 75% p-mRCA, 60% dRCA   DDD (degenerative disc disease), lumbar 12/01/2013   Diastolic dysfunction 01/12/2021   a.) TTE 01/12/2021 (s/p inf STEMI): EF 55-60%, mild-mod LAE, mild RAE, mod RVE, mild AR/MR, mod TR, G1DD   Diet-controlled type 2 diabetes mellitus (HCC)    Full dentures    Gout    History of bilateral  cataract extraction 2021   Hyperlipidemia    Hypertension, essential    Long term current use of anticoagulant    a.) apixaban   PONV (postoperative nausea and vomiting)    Sinoatrial node dysfunction (HCC) 11/10/2013   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) 01/11/2021   a.) LHC/PCI 01/11/2021 --> culprit lesion 99% pRCA --> 2.75 x 30 mm Resolute Onyx DES x 1   Varicose veins of legs    Wears hearing aid in both ears     Medications Prior to Admission  Medication Sig Dispense Refill   allopurinol (ZYLOPRIM) 300 MG tablet Take 300 mg by mouth daily.     ALPRAZolam (XANAX) 0.25 MG tablet Take 0.25 mg by mouth 2 (two) times daily as needed for anxiety.     apixaban (ELIQUIS) 5 MG TABS tablet Take 5 mg by mouth 2 (two) times daily.     atorvastatin (LIPITOR) 80 MG tablet Take 1 tablet (80 mg total) by mouth daily. 30 tablet 0   citalopram (CELEXA) 10 MG tablet Take 10 mg by mouth daily.     metoprolol tartrate (LOPRESSOR) 25 MG tablet Take 1 tablet (25 mg total) by mouth 2 (two) times daily. 60 tablet 0   ondansetron (ZOFRAN-ODT) 4 MG disintegrating tablet Take 4 mg by mouth every 8 (eight) hours as needed.     oxyCODONE (OXY IR/ROXICODONE) 5 MG immediate release tablet Take 1 tablet (5 mg total) by mouth every 4 (four) hours as needed  for moderate pain (pain score 4-6). 30 tablet 0   potassium chloride (KLOR-CON) 10 MEQ tablet Take 10 mEq by mouth daily.     sacubitril-valsartan (ENTRESTO) 24-26 MG Take 1 tablet by mouth 2 (two) times daily.     torsemide (DEMADEX) 20 MG tablet Take 20 mg by mouth daily.     traMADol (ULTRAM) 50 MG tablet Take 1-2 tablets (50-100 mg total) by mouth every 4 (four) hours as needed for moderate pain. 30 tablet 0      allopurinol  300 mg Oral Daily   apixaban  5 mg Oral BID   aspirin EC  81 mg Oral Daily   atorvastatin  80 mg Oral Daily   citalopram  10 mg Oral Daily   clopidogrel  75 mg Oral Daily   feeding supplement  237 mL  Oral BID BM   metoprolol tartrate  25 mg Oral BID   potassium chloride  10 mEq Oral Daily    Infusions:   Allergies  Allergen Reactions   Celebrex [Celecoxib] Diarrhea   Penicillins Itching    IgE = 154 (WNL) on 01/05/2023   Relafen [Nabumetone]     Palpitation    Lovastatin Diarrhea    Social History   Socioeconomic History   Marital status: Widowed    Spouse name: Not on file   Number of children: 4   Years of education: Not on file   Highest education level: Not on file  Occupational History   Not on file  Tobacco Use   Smoking status: Former    Current packs/day: 0.00    Types: Cigarettes    Quit date: 1980    Years since quitting: 44.5   Smokeless tobacco: Never  Vaping Use   Vaping status: Never Used  Substance and Sexual Activity   Alcohol use: Not Currently   Drug use: Never   Sexual activity: Not on file  Other Topics Concern   Not on file  Social History Narrative   Not on file   Social Determinants of Health   Financial Resource Strain: Patient Declined (11/05/2022)   Received from [facility], [facility]   Overall Financial Resource Strain (CARDIA)    Difficulty of Paying Living Expenses: Patient declined  Food Insecurity: No Food Insecurity (01/28/2023)   Hunger Vital Sign    Worried About Running Out of Food in the Last Year: Never true    Ran Out of Food in the Last Year: Never true  Transportation Needs: No Transportation Needs (01/28/2023)   PRAPARE - Administrator, Civil Service (Medical): No    Lack of Transportation (Non-Medical): No  Physical Activity: Not on file  Stress: Not on file  Social Connections: Not on file  Intimate Partner Violence: Not At Risk (01/28/2023)   Humiliation, Afraid, Rape, and Kick questionnaire    Fear of Current or Ex-Partner: No    Emotionally Abused: No    Physically Abused: No    Sexually Abused: No    Family History  Problem Relation Age of Onset    Heart attack Mother 71       required open heart surgery. Onset age is unknown.   Stroke Father    Diabetes Father    Alcoholism Brother    Breast cancer Neg Hx     PHYSICAL EXAM: Vitals:   01/29/23 0838 01/29/23 1203  BP: (!) 154/72 (!) 120/51  Pulse: 66 81  Resp: 16 16  Temp: 99.4  F (37.4 C) 99.9 F (37.7 C)  SpO2:  100%     Intake/Output Summary (Last 24 hours) at 01/29/2023 1539 Last data filed at 01/29/2023 1101 Gross per 24 hour  Intake 1357.99 ml  Output 1650 ml  Net -292.01 ml    General:  Well appearing. No respiratory difficulty HEENT: normal Neck: supple. no JVD. Carotids 2+ bilat; no bruits. No lymphadenopathy or thryomegaly appreciated. Cor: PMI nondisplaced. Regular rate & rhythm. No rubs, gallops or murmurs. Lungs: clear Abdomen: soft, nontender, nondistended. No hepatosplenomegaly. No bruits or masses. Good bowel sounds. Extremities: no cyanosis, clubbing, rash, edema Neuro: alert & oriented x 3, cranial nerves grossly intact. moves all 4 extremities w/o difficulty. Affect pleasant.  ECG: Atrial fibrillation with controlled ventricular rate and old anteroseptal wall MI  Results for orders placed or performed during the hospital encounter of 01/27/23 (from the past 24 hour(s))  Glucose, capillary     Status: None   Collection Time: 01/28/23  4:10 PM  Result Value Ref Range   Glucose-Capillary 96 70 - 99 mg/dL  Glucose, capillary     Status: Abnormal   Collection Time: 01/28/23  8:28 PM  Result Value Ref Range   Glucose-Capillary 122 (H) 70 - 99 mg/dL  Glucose, capillary     Status: Abnormal   Collection Time: 01/29/23  8:33 AM  Result Value Ref Range   Glucose-Capillary 115 (H) 70 - 99 mg/dL  Glucose, capillary     Status: Abnormal   Collection Time: 01/29/23 12:05 PM  Result Value Ref Range   Glucose-Capillary 168 (H) 70 - 99 mg/dL   ECHOCARDIOGRAM COMPLETE BUBBLE STUDY  Result Date: 01/28/2023    ECHOCARDIOGRAM REPORT   Patient Name:    JAZLY EMPE[place] Date of Exam: 01/28/2023 Medical Rec #:  [id number]        Height:       62.0 in Accession #:    [id number]       Weight:       193.1 lb Date of Birth:  1940-09-25        BSA:          1.883 m Patient Age:    81 years         BP:           112/61 mmHg Patient Gender: F                HR:           65 bpm. Exam Location:  [facility] Procedure: 2D Echo, Cardiac Doppler, Color Doppler and Saline Contrast Bubble            Study Indications:     TIA 435.9 / G45.9  History:         Patient has prior history of Echocardiogram examinations, most                  recent 01/14/2021. Arrythmias:Atrial Fibrillation; Risk                  Factors:Hypertension.  Sonographer:     Cristela Blue Referring Phys:  [id number] JAN A MANSY Diagnosing Phys: Julien Nordmann MD  Sonographer Comments: Suboptimal apical window. IMPRESSIONS  1. Left ventricular ejection fraction, by estimation, is 60 to 65%. The left ventricle has normal function. The left ventricle has no regional wall motion abnormalities. Left ventricular diastolic parameters are indeterminate.  2. Right ventricular systolic function is normal. The right ventricular size is normal. There is normal  pulmonary artery systolic pressure. The estimated right ventricular systolic pressure is 26.3 mmHg.  3. Left atrial size was moderately dilated.  4. Right atrial size was mildly dilated.  5. The mitral valve is normal in structure. Mild mitral valve regurgitation. No evidence of mitral stenosis.  6. Tricuspid valve regurgitation is mild to moderate.  7. The aortic valve is normal in structure. Aortic valve regurgitation is not visualized. Aortic valve sclerosis is present, with no evidence of aortic valve stenosis.  8. The inferior vena cava is normal in size with greater than 50% respiratory variability, suggesting right atrial pressure of 3 mmHg.  9. Agitated saline contrast bubble study was negative, with no evidence of any interatrial shunt. FINDINGS  Left Ventricle: Left  ventricular ejection fraction, by estimation, is 60 to 65%. The left ventricle has normal function. The left ventricle has no regional wall motion abnormalities. The left ventricular internal cavity size was normal in size. There is  no left ventricular hypertrophy. Left ventricular diastolic parameters are indeterminate. Right Ventricle: The right ventricular size is normal. No increase in right ventricular wall thickness. Right ventricular systolic function is normal. There is normal pulmonary artery systolic pressure. The tricuspid regurgitant velocity is 2.31 m/s, and  with an assumed right atrial pressure of 5 mmHg, the estimated right ventricular systolic pressure is 26.3 mmHg. Left Atrium: Left atrial size was moderately dilated. Right Atrium: Right atrial size was mildly dilated. Pericardium: There is no evidence of pericardial effusion. Mitral Valve: The mitral valve is normal in structure. There is mild calcification of the mitral valve leaflet(s). Mild mitral annular calcification. Mild mitral valve regurgitation. No evidence of mitral valve stenosis. Tricuspid Valve: The tricuspid valve is normal in structure. Tricuspid valve regurgitation is mild to moderate. No evidence of tricuspid stenosis. Aortic Valve: The aortic valve is normal in structure. Aortic valve regurgitation is not visualized. Aortic valve sclerosis is present, with no evidence of aortic valve stenosis. Aortic valve mean gradient measures 8.3 mmHg. Aortic valve peak gradient measures 15.6 mmHg. Aortic valve area, by VTI measures 1.66 cm. Pulmonic Valve: The pulmonic valve was normal in structure. Pulmonic valve regurgitation is not visualized. No evidence of pulmonic stenosis. Aorta: The aortic root is normal in size and structure. Venous: The inferior vena cava is normal in size with greater than 50% respiratory variability, suggesting right atrial pressure of 3 mmHg. IAS/Shunts: No atrial level shunt detected by color flow Doppler.  Agitated saline contrast was given intravenously to evaluate for intracardiac shunting. Agitated saline contrast bubble study was negative, with no evidence of any interatrial shunt. There  is no evidence of a patent foramen ovale. There is no evidence of an atrial septal defect.  LEFT VENTRICLE PLAX 2D LVIDd:         3.90 cm   Diastology LVIDs:         2.50 cm   LV e' medial:    8.16 cm/s LV PW:         0.80 cm   LV E/e' medial:  13.7 LV IVS:        1.40 cm   LV e' lateral:   11.10 cm/s LVOT diam:     2.00 cm   LV E/e' lateral: 10.1 LV SV:         67 LV SV Index:   36 LVOT Area:     3.14 cm  RIGHT VENTRICLE RV Basal diam:  3.70 cm RV Mid diam:    3.20 cm RV S  prime:     13.10 cm/s TAPSE (M-mode): 1.3 cm LEFT ATRIUM             Index        RIGHT ATRIUM           Index LA diam:        4.10 cm 2.18 cm/m   RA Area:     22.60 cm LA Vol (A2C):   68.3 ml 36.26 ml/m  RA Volume:   74.90 ml  39.77 ml/m LA Vol (A4C):   82.3 ml 43.70 ml/m LA Biplane Vol: 80.7 ml 42.85 ml/m  AORTIC VALVE AV Area (Vmax):    1.49 cm AV Area (Vmean):   1.57 cm AV Area (VTI):     1.66 cm AV Vmax:           197.33 cm/s AV Vmean:          133.667 cm/s AV VTI:            0.406 m AV Peak Grad:      15.6 mmHg AV Mean Grad:      8.3 mmHg LVOT Vmax:         93.40 cm/s LVOT Vmean:        67.000 cm/s LVOT VTI:          0.214 m LVOT/AV VTI ratio: 0.53  AORTA Ao Root diam: 3.30 cm MITRAL VALVE                TRICUSPID VALVE MV Area (PHT): 4.26 cm     TR Peak grad:   21.3 mmHg MV Decel Time: 178 msec     TR Vmax:        231.00 cm/s MV E velocity: 112.00 cm/s                             SHUNTS                             Systemic VTI:  0.21 m                             Systemic Diam: 2.00 cm Julien Nordmann MD Electronically signed by Julien Nordmann MD Signature Date/Time: 01/28/2023/12:52:11 PM    Final    CT HEAD CODE STROKE WO CONTRAST`  Addendum Date: 01/28/2023   ADDENDUM REPORT: 01/28/2023 06:48 ADDENDUM: Study discussed by telephone with  Dr. Valente David on 01/28/2023 at 0642 hours. Electronically Signed   By: Odessa Fleming M.D.   On: 01/28/2023 06:48   Result Date: 01/28/2023 CLINICAL DATA:  Code stroke. 82 year old female with weakness and altered mental status, change in stroke symptoms. Numerous scattered small foci of ischemia in the left hemisphere on MRI yesterday. EXAM: CT HEAD WITHOUT CONTRAST TECHNIQUE: Contiguous axial images were obtained from the base of the skull through the vertex without intravenous contrast. RADIATION DOSE REDUCTION: This exam was performed according to the departmental dose-optimization program which includes automated exposure control, adjustment of the mA and/or kV according to patient size and/or use of iterative reconstruction technique. COMPARISON:  Brain MRI 2317 hours yesterday. Head CT 1859 hours yesterday. FINDINGS: Brain: No acute intracranial hemorrhage identified. No midline shift, mass effect, or evidence of intracranial mass lesion. Stable ventricle size and configuration. Patchy bilateral white matter hypodensity is stable. The multiple small foci of abnormal diffusion  scattered in the left hemisphere on MRI last night are largely occult by CT. Stable gray-white matter differentiation throughout the brain. No new cortically based infarct identified. Vascular: No suspicious intracranial vascular hyperdensity. Calcified atherosclerosis at the skull base. Skull: No acute osseous abnormality identified. Mild motion artifact. Sinuses/Orbits: Visualized paranasal sinuses and mastoids are stable and well aerated. Other: No gaze deviation, acute orbit or scalp soft tissue finding. ASPECTS [facility] Stroke Program Early CT Score) Total score (0-10 with 10 being normal): 10 IMPRESSION: Stable noncontrast CT appearance of the brain since yesterday, the multiple small infarcts by MRI are largely occult by CT. No new intracranial abnormality. Electronically Signed: By: Odessa Fleming M.D. On: 01/28/2023 06:37   MR BRAIN WO  CONTRAST  Result Date: 01/28/2023 CLINICAL DATA: Confusion, right upper extremity weakness EXAM: MRI HEAD WITHOUT CONTRAST TECHNIQUE: Multiplanar, multiecho pulse sequences of the brain and surrounding structures were obtained without intravenous contrast. COMPARISON:  No prior MRI available, correlation is made with 01/27/2023 CT head and CTA head and neck FINDINGS: Evaluation is significantly limited by motion, with the exception of the diffusion-weighted sequence. Brain: Scattered foci of cortical and white matter restricted diffusion with ADC correlates in the left cerebral hemisphere and left caudate (series 5, images 20-38). Evaluation for T2 hyperintense correlate is limited by motion artifact No definite acute hemorrhage, mass, mass effect, or midline shift. No hydrocephalus or extra-axial collection. Vascular: As seen on the prior CTA, the right V4 flow void is not well seen. Otherwise normal arterial flow voids. Skull and upper cervical spine: Grossly normal marrow signal. Sinuses/Orbits: Clear paranasal sinuses. Status post bilateral lens replacements. Other: The mastoid air cells are well aerated. IMPRESSION: Evaluation is significantly limited by motion, with the exception of the diffusion-weighted sequence. 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 These results were called by telephone at the time of interpretation on 01/28/2023 at 1:05 am to provider [facility] , who verbally acknowledged these results. Electronically Signed   By: Wiliam Ke M.D.   On: 01/28/2023 01:05   CT ANGIO HEAD NECK W WO CM  Result Date: 01/27/2023 CLINICAL DATA:  Confusion and right upper extremity weakness EXAM: CT ANGIOGRAPHY HEAD AND NECK WITH AND WITHOUT CONTRAST TECHNIQUE: Multidetector CT imaging of the head and neck was performed using the standard protocol during bolus administration of intravenous contrast. Multiplanar CT image  reconstructions and MIPs were obtained to evaluate the vascular anatomy. Carotid stenosis measurements (when applicable) are obtained utilizing NASCET criteria, using the distal internal carotid diameter as the denominator. RADIATION DOSE REDUCTION: This exam was performed according to the departmental dose-optimization program which includes automated exposure control, adjustment of the mA and/or kV according to patient size and/or use of iterative reconstruction technique. CONTRAST:  75mL OMNIPAQUE IOHEXOL 350 MG/ML SOLN COMPARISON:  No prior CTA available, correlation is made with CT head 01/27/2023 FINDINGS: CT HEAD FINDINGS For noncontrast findings, please see same day CT head. CTA NECK FINDINGS Aortic arch: Evaluation of the arch is somewhat limited by motion. Within this limitation, Two-vessel arch with a common origin of the brachiocephalic and left common carotid arteries. Imaged portion shows no evidence of aneurysm or dissection. No significant stenosis of the major arch vessel origins. Mild aortic atherosclerosis. Right carotid system: 60% stenosis in the proximal right ICA (series 6, image 194). Additional atherosclerotic in the right common carotid artery is not hemodynamically significant. Left carotid system: 70% stenosis in the  proximal left ICA (series 6, image 199). Additional atherosclerotic disease in the common carotid artery is not hemodynamically significant. Vertebral arteries: Left dominant system. The left vertebral artery is patent from its origin to the skull base without significant stenosis or evidence of dissection. The right vertebral artery is quite diminutive, and poorly perfused in the distal V3 segment (series 6, image 159), likely reflecting moderate stenosis. Skeleton: No acute osseous abnormality. Degenerative changes in the cervical spine. Other neck: Hypoenhancing and calcified nodules in the thyroid, the largest of which measures up to 6 mm, for which no follow-up is  currently indicated. (Reference: J Am Coll Radiol. 2015 Feb;12(2): 143-50) Upper chest: No focal pulmonary opacity or pleural effusion. Mild centrilobular emphysema. Review of the MIP images confirms the above findings CTA HEAD FINDINGS Anterior circulation: Both internal carotid arteries are patent to the termini, with mild stenosis in the bilateral cavernous and supraclinoid segments. A1 segments patent. Normal anterior communicating artery. Anterior cerebral arteries are patent to their distal aspects without significant stenosis. No M1 stenosis or occlusion. MCA branches are irregular but perfused to their distal aspects without significant stenosis. Posterior circulation: Nonopacification of the proximal right V4 (series 6, image 146), consistent with occlusion. The more distal right V4 is opacified, likely retrograde. The left vertebral artery is patent to the vertebrobasilar junction with mild stenosis in the proximal V4 (series 6, image 141). Posteroinferior cerebellar arteries are not definitively visualized. Basilar patent to its distal aspect without significant stenosis. Superior cerebellar arteries patent proximally. Patent P1 segments. Mild stenosis in the proximal right P2 (series 6, image 99), and mid left P 2 (series 6, image 105). The remainder of the PCAs are irregular but do not demonstrate significant stenosis. The right posterior communicating artery is patent. The left is not definitively seen. Venous sinuses: As permitted by contrast timing, patent. Anatomic variants: None significant. Review of the MIP images confirms the above findings IMPRESSION: 1. 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 This is of indeterminate acuity. 2. 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 3. Mild stenosis in the bilateral cavernous and supraclinoid ICAs. 4. Mild stenosis in the proximal right P2 and mid left P2.  5. Aortic atherosclerosis. 6. Emphysema. Aortic Atherosclerosis (ICD10-I70.0) and Emphysema (ICD10-J43.9). These results were called by telephone at the time of interpretation on 01/27/2023 at 10:07 pm to provider [facility] , who verbally acknowledged these results. Electronically Signed   By: Wiliam Ke M.D.   On: 01/27/2023 22:07   CT Head Wo Contrast  Result Date: 01/27/2023 CLINICAL DATA:  Confusion. EXAM: CT HEAD WITHOUT CONTRAST TECHNIQUE: Contiguous axial images were obtained from the base of the skull through the vertex without intravenous contrast. RADIATION DOSE REDUCTION: This exam was performed according to the departmental dose-optimization program which includes automated exposure control, adjustment of the mA and/or kV according to patient size and/or use of iterative reconstruction technique. COMPARISON:  Head CT dated 10/03/2021. FINDINGS: Brain: Mild age-related atrophy and moderate chronic microvascular ischemic changes. There is no acute intracranial hemorrhage. No mass effect or midline shift. No extra-axial fluid collection. Vascular: No hyperdense vessel or unexpected calcification. Skull: Normal. Negative for fracture or focal lesion. Sinuses/Orbits: No acute finding. Other: None IMPRESSION: 1. No acute intracranial pathology. 2. Mild age-related atrophy and moderate chronic microvascular ischemic changes. Electronically Signed   By: Elgie Collard M.D.   On: 01/27/2023 19:14  ASSESSMENT AND PLAN: Risk assessment for vascular surgery such as endarterectomy versus carotid stenting in a patient with  CVA.  Patient denies any chest pain or shortness of breath.  However given patient's history advised surgery after at least 6 to 8 weeks from this admission.  Since patient urgently needs surgery, may have to be mindful that has moderate to high risk.  Thank you very much for follow-up.  Lukis Bunt [place] Flakes

## 2023-01-30 DIAGNOSIS — I63412 Cerebral infarction due to embolism of left middle cerebral artery: Secondary | ICD-10-CM | POA: Diagnosis not present

## 2023-01-30 DIAGNOSIS — Z7189 Other specified counseling: Secondary | ICD-10-CM | POA: Diagnosis not present

## 2023-01-30 DIAGNOSIS — I6389 Other cerebral infarction: Secondary | ICD-10-CM | POA: Diagnosis not present

## 2023-01-30 DIAGNOSIS — I2510 Atherosclerotic heart disease of native coronary artery without angina pectoris: Secondary | ICD-10-CM | POA: Diagnosis not present

## 2023-01-30 DIAGNOSIS — I482 Chronic atrial fibrillation, unspecified: Secondary | ICD-10-CM | POA: Diagnosis not present

## 2023-01-30 DIAGNOSIS — Z01810 Encounter for preprocedural cardiovascular examination: Secondary | ICD-10-CM | POA: Diagnosis not present

## 2023-01-30 DIAGNOSIS — I5032 Chronic diastolic (congestive) heart failure: Secondary | ICD-10-CM | POA: Diagnosis not present

## 2023-01-30 LAB — GLUCOSE, CAPILLARY
Glucose-Capillary: 117 mg/dL — ABNORMAL HIGH (ref 70–99)
Glucose-Capillary: 133 mg/dL — ABNORMAL HIGH (ref 70–99)
Glucose-Capillary: 143 mg/dL — ABNORMAL HIGH (ref 70–99)

## 2023-01-30 MED ORDER — SENNOSIDES-DOCUSATE SODIUM 8.6-50 MG PO TABS
1.0000 | ORAL_TABLET | Freq: Two times a day (BID) | ORAL | Status: DC
  Administered 2023-01-30 – 2023-01-31 (×3): 1 via ORAL
  Filled 2023-01-30 (×3): qty 1

## 2023-01-30 MED ORDER — BISACODYL 10 MG RE SUPP: 10.0000 mg | Freq: Every day | RECTAL | Status: DC | PRN

## 2023-01-30 MED ORDER — HYDROXYZINE HCL 50 MG PO TABS
25.0000 mg | ORAL_TABLET | Freq: Three times a day (TID) | ORAL | Status: DC | PRN
  Administered 2023-01-30: 25 mg via ORAL
  Filled 2023-01-30: qty 1

## 2023-01-30 MED ORDER — POLYETHYLENE GLYCOL 3350 17 G PO PACK
17.0000 g | PACK | Freq: Every day | ORAL | Status: DC
  Administered 2023-01-30 – 2023-01-31 (×2): 17 g via ORAL
  Filled 2023-01-30 (×2): qty 1

## 2023-01-30 MED ORDER — TORSEMIDE 20 MG PO TABS
20.0000 mg | ORAL_TABLET | Freq: Every day | ORAL | Status: DC
  Administered 2023-01-30 – 2023-02-02 (×4): 20 mg via ORAL
  Filled 2023-01-30 (×4): qty 1

## 2023-01-30 MED ORDER — SACUBITRIL-VALSARTAN 24-26 MG PO TABS
1.0000 | ORAL_TABLET | Freq: Two times a day (BID) | ORAL | Status: DC
  Administered 2023-01-30 – 2023-02-02 (×7): 1 via ORAL
  Filled 2023-01-30 (×7): qty 1

## 2023-01-30 MED ORDER — CLONAZEPAM 0.5 MG PO TABS
0.5000 mg | ORAL_TABLET | Freq: Three times a day (TID) | ORAL | Status: DC | PRN
  Administered 2023-01-31 – 2023-02-01 (×2): 0.5 mg via ORAL
  Filled 2023-01-30 (×2): qty 1

## 2023-01-30 NOTE — Progress Notes (Signed)
Daily Progress Note   Patient Name: Brooke Davis       Date: 01/30/2023 DOB: 1940-09-07  Age: 82 y.o. MRN#: [id number] Attending Physician: Tresa Moore, MD Primary Care Physician: [name], MD Admit Date: 01/27/2023  Reason for Consultation/Follow-up: Establishing goals of care  HPI/Brief Hospital Review: 82 y.o. female  with past medical history of T2DM, HTN, HLD, CAD s/p PCI and stent, diastolic dysfunction and anxiety admitted from home on 01/27/2023 with acute onset altered mental status, slurred speech, expressive aphasia and right arm weakness.    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 Otherwise unchanged scattered acute to early subacute infarcts throughout the left cerebral hemisphere. No acute hemorrhage, mass effect, or midline shift.   Subjective: Extensive chart review has been completed prior to meeting patient including labs, vital signs, imaging, progress notes, orders, and available advanced directive documents from current and previous encounters.    Visited with Brooke Davis at her bedside. Awake and alert, less interactive compared to visit yesterday, slow to answer questions. Easily drifts off to sleep without redirection. Daughter at bedside during time of visit, able to share and recall events that happened yesterday afternoon. Aware a new area of stroke found on MRI imaging.   Daughter shares her understanding of current plan, likely proceeding with carotid  intervention at this time more urgently due to new findings. Has been seen by cardiology this AM-clearance for intervention has been given according to daughter.  Again discussed goals of care in light of new findings. Daughter shares she and her mother have had conversations earlier today. Continues to desire Full Code status but would not want long term life preserving measures such as trach/PEG. Encouraged continued conversations regarding Code Status and to consider DNR/DNI status understanding evidenced based poor outcomes in similar hospitalized patients, as the cause of the arrest is likely associated with chronic/terminal disease rather than a reversible acute cardio-pulmonary event.    Answered and addressed all questions and concerns. PMT to continue to follow for ongoing needs and support.  Thank you for allowing the Palliative Medicine Team  to assist in the care of this patient.  Total time:  35 minutes  Time spent includes: Detailed review of medical records (labs, imaging, vital signs), medically appropriate exam (mental status, respiratory, cardiac, skin), discussed with treatment team, counseling and educating patient, family and staff, documenting clinical information, medication management and coordination of care.  Leeanne Deed, DNP, AGNP-C Palliative Medicine   Please contact Palliative Medicine Team phone at [phone] for questions and concerns.

## 2023-01-30 NOTE — Plan of Care (Signed)
  Problem: Activity: Goal: Ability to tolerate increased activity will improve Outcome: Progressing   Problem: Coping: Goal: Will verbalize positive feelings about self Outcome: Progressing Goal: Will identify appropriate support needs Outcome: Progressing   Problem: Coping: Goal: Will identify appropriate support needs Outcome: Progressing

## 2023-01-30 NOTE — Progress Notes (Signed)
Physical Therapy Re-Evaluation Patient Details Name: Brooke Davis MRN: [id number] DOB: 04-Mar-1941 Today's Date: 01/30/2023  History of Present Illness  Pt presents s/p altered mental status and right sided weakness from small left acute infarct in corona radiata. She also recently had right THA on 01/18/23.  Clinical Impression  Pt presents sitting in bedside chair with daughters present and agreeable to PT. She demonstrates no symmetrical weakness with ability to flex BUE above head and lift BLE against gravity. She does feel altered sensation but this is in her right anterior thigh. Pt is progressing towards her goals with decreased assistance to perform sit to stand with min A and take steps in stand step pivot transfer with min A. Pt does require increased time to initiate and execute motor tasks and to process commands, but she is aware of this deficit and she requests additional time. She will continue to benefit from skilled PT in house to further progress towards goals to address aforementioned deficits to return home safely without increased risk of falling.       Assistance Recommended at Discharge Frequent or constant Supervision/Assistance  If plan is discharge home, recommend the following:  Can travel by private vehicle  A lot of help with walking and/or transfers;A lot of help with bathing/dressing/bathroom;Assistance with cooking/housework;Help with stairs or ramp for entrance   No    Equipment Recommendations    Recommendations for Other Services       Functional Status Assessment       Precautions / Restrictions Precautions Precautions: Posterior Hip Precaution Booklet Issued: No Precaution Comments: Verbally reminded pt of posterior hip precautions Restrictions Weight Bearing Restrictions: No RLE Weight Bearing: Weight bearing as tolerated Other Position/Activity Restrictions: RLE WBAT      Mobility  Bed Mobility                     Transfers Overall transfer level: Modified independent Equipment used: Rolling walker (2 wheels) Transfers: Sit to/from Stand, Bed to chair/wheelchair/BSC Sit to Stand: Modified independent (Device/Increase time)   Step pivot transfers: Min guard      Lateral/Scoot Transfers: Supervision General transfer comment: Mod VC and TC to take steps towards side she was turning to    Ambulation/Gait                  Stairs            Wheelchair Mobility     Tilt Bed    Modified Rankin (Stroke Patients Only)       Balance     Sitting balance-Leahy Scale: Good       Standing balance-Leahy Scale: Fair Standing balance comment: Requires BUE support                             Pertinent Vitals/Pain Pain Assessment Pain Assessment: No/denies pain    Home Living   Living Arrangements: Other relatives (grandson and great-grandchildren) Available Help at Discharge: Family;Available 24 hours/day Type of Home: House Home Access: Ramped entrance       Home Layout: One level        Prior Function                       Hand Dominance        Extremity/Trunk Assessment   Upper Extremity Assessment RUE Deficits / Details: AROM: RUE shoulder flexion to approx 100 degrees, elbow approx 3/4 full  AROM, wrist 1/2 full AROM; grip strength moderately impaired RUE Sensation: decreased proprioception RUE Coordination: decreased fine motor;decreased gross motor    Lower Extremity Assessment RLE Deficits / Details: 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    Cervical / Trunk Assessment Cervical / Trunk Assessment: Normal  Communication      Cognition Arousal/Alertness: Awake/alert Behavior During Therapy: Flat affect Overall Cognitive Status: History of cognitive impairments - at baseline Area of Impairment: Following commands                 Orientation Level: Person, Place Current Attention Level: Sustained   Following Commands: Follows one step  commands consistently     Problem Solving: Slow processing General Comments: Takes prolonged amount of time to initiate task, but she is aware of this.        General Comments      Exercises     Assessment/Plan    PT Assessment    PT Problem List         PT Treatment Interventions      PT Goals (Current goals can be found in the Care Plan section)  Acute Rehab PT Goals Patient Stated Goal: To return home safely PT Goal Formulation: With patient/family Time For Goal Achievement: 02/13/23 Potential to Achieve Goals: Good    Frequency Min 5X/week     Co-evaluation               AM-PAC PT "6 Clicks" Mobility  Outcome Measure Help needed turning from your back to your side while in a flat bed without using bedrails?: None Help needed moving from lying on your back to sitting on the side of a flat bed without using bedrails?: None Help needed moving to and from a bed to a chair (including a wheelchair)?: A Little Help needed standing up from a chair using your arms (e.g., wheelchair or bedside chair)?: A Lot Help needed to walk in hospital room?: Total Help needed climbing 3-5 steps with a railing? : Total 6 Click Score: 15    End of Session Equipment Utilized During Treatment: Gait belt Activity Tolerance: Patient tolerated treatment well Patient left: with family/visitor present;with nursing/sitter in room (on bedside commode) Nurse Communication: Mobility status PT Visit Diagnosis: Unsteadiness on feet (R26.81);Other abnormalities of gait and mobility (R26.89);Muscle weakness (generalized) (M62.81);Difficulty in walking, not elsewhere classified (R26.2) Pain - Right/Left: Right Pain - part of body: Hip    Time: 1505-1550 PT Time Calculation (min) (ACUTE ONLY): 45 min   Charges:   PT Evaluation $PT Re-evaluation: 1 Re-eval PT Treatments $Therapeutic Activity: 38-52 mins PT General Charges $$ ACUTE PT VISIT: 1 Visit       Ellin Goodie PT, DPT   01/30/2023, 4:13 PM

## 2023-01-30 NOTE — Progress Notes (Signed)
Subjective: No further events.   Exam: Vitals:   01/30/23 0823 01/30/23 1245  BP: (!) 158/67 (!) 154/71  Pulse: 77 67  Resp: 17   Temp: 98.9 F (37.2 C) 98.9 F (37.2 C)  SpO2: 98% 100%   Gen: In bed, NAD Resp: non-labored breathing, no acute distress Abd: soft, nt  Neuro: MS: Awake, alert, she is able to name fingers and watch/strap, but does appear to be slightly more slow to answer questions than she had been previously. CN: She does have a right facial weakness Motor: 4/5 in the right arm and 4-/5 in the right leg.  Sensory: Intact to light touch  Pertinent Labs: LDL 46  Impression: 82 year old female with recent embolic appearing strokes on the left who had transient worsening of her symptoms.  It is possible that she had recurrent embolus, despite being on dual antiplatelet therapy and apixaban.  It is also possible that this represents fluctuation of her underlying stroke symptoms.  The MRI is suggestive of a new punctate stroke, however this is so small that I think it could very well have been simply between slices on the previous MRI.  There is an area that could be partial volume averaging on the previous MRI, but this is not definite.  I would favor continued aspirin and Eliquis for secondary stroke prevention.  I am not sure how much of her current debilitation is driven by her hip surgery, nor how much that should play into the decision making for timing of carotid endarterectomy.  With mild symptoms, surgery would be preferred in the first 2 weeks, but she may be slightly more severe than would be typical for this, though her ischemic burden is relatively low.  Even with recurrent events, we may need to wait, and the patient is much preferring carotid stenting versus endarterectomy, which is typically done more than 14 days from the index event.   Another consideration may be the need for DAPT with stenting which may necessitate triple therapy with ASA, plavix, and  eliquis.  I would defer to vascular surgery for choice, but would favor avoiding triple therapy in the first couple of weeks if possible.   Recommendations: 1) continue apixaban 5 mg twice daily and aspirin 81 mg daily 2) could discuss with vascular surgery for choice and timing of any intervention. 3) continue home lipitor 80mg  daily 4) Neurology will follow.    Ritta Slot, MD [facility] [phone]  If 7pm- 7am, please page neurology on call as listed in AMION.

## 2023-01-30 NOTE — Progress Notes (Signed)
SUBJECTIVE: Patient had another episode of slurred speech and right arm weakness.  Vitals:   01/30/23 0136 01/30/23 0423 01/30/23 0823 01/30/23 1245  BP: 125/63 (!) 149/62 (!) 158/67 (!) 154/71  Pulse: 74 77 77 67  Resp:  18 17   Temp:  98.4 F (36.9 C) 98.9 F (37.2 C) 98.9 F (37.2 C)  TempSrc:   Oral   SpO2: 97% 100% 98% 100%  Weight:      Height:        Intake/Output Summary (Last 24 hours) at 01/30/2023 1249 Last data filed at 01/30/2023 0915 Gross per 24 hour  Intake --  Output 950 ml  Net -950 ml    LABS: Basic Metabolic Panel: Recent Labs    01/27/23 1827 01/28/23 0400  NA 134* 136  K 4.5 4.5  CL 100 104  CO2 25 27  GLUCOSE 123* 124*  BUN 15 14  CREATININE 0.99 1.03*  CALCIUM 8.8* 8.7*   Liver Function Tests: Recent Labs    01/27/23 1827  AST 23  ALT 18  ALKPHOS 98  BILITOT 0.7  PROT 6.3*  ALBUMIN 2.7*   Recent Labs    01/27/23 1827  LIPASE 34   CBC: Recent Labs    01/27/23 1827 01/28/23 0400  WBC 8.3 7.7  HGB 8.8* 8.6*  HCT 28.1* 27.0*  MCV 101.1* 101.1*  PLT 311 295   Cardiac Enzymes: No results for input(s): "CKTOTAL", "CKMB", "CKMBINDEX", "TROPONINI" in the last 72 hours. BNP: Invalid input(s): "POCBNP" D-Dimer: No results for input(s): "DDIMER" in the last 72 hours. Hemoglobin A1C: No results for input(s): "HGBA1C" in the last 72 hours. Fasting Lipid Panel: Recent Labs    01/28/23 0400  CHOL 86  HDL 29*  LDLCALC 46  TRIG 55  CHOLHDL 3.0   Thyroid Function Tests: No results for input(s): "TSH", "T4TOTAL", "T3FREE", "THYROIDAB" in the last 72 hours.  Invalid input(s): "FREET3" Anemia Panel: No results for input(s): "VITAMINB12", "FOLATE", "FERRITIN", "TIBC", "IRON", "RETICCTPCT" in the last 72 hours.   PHYSICAL EXAM General: Well developed, well nourished, in no acute distress HEENT:  Normocephalic and atramatic Neck:  No JVD.  Lungs: Clear bilaterally to auscultation and percussion. Heart: HRRR . Normal S1 and  S2 without gallops or murmurs.  Abdomen: Bowel sounds are positive, abdomen soft and non-tender  Msk:  Back normal, normal gait. Normal strength and tone for age. Extremities: No clubbing, cyanosis or edema.   Neuro: Alert and oriented X 3. 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  CT angiogram reveals bilateral high-grade lesion and both internal carotid arteries.  Patient needs either endarterectomy or carotid stenting and we were asked to evaluate the patient.  Because of recent stroke patient would be moderate to high risk for endarterectomy but for carotid stenting option would be more tolerable.  Patient has prior history of myocardial infarction 3 years ago and had PCI and stenting but denies any chest pain or shortness of breath.  Advise proceeding with vascular intervention.   ICD-10-CM   1. Right arm weakness  R29.898       Principal Problem:   Embolic stroke [facility]) Active Problems:   Chronic atrial fibrillation with RVR (HCC)   Dyslipidemia   Chronic diastolic CHF (congestive heart failure) (HCC)   Gout   Anxiety and depression   Right arm weakness   Acute CVA (  cerebrovascular accident) [facility])   Bilateral carotid artery stenosis    Adrian Blackwater, MD, [facility] 01/30/2023 12:49 PM

## 2023-01-30 NOTE — Progress Notes (Signed)
PROGRESS NOTE    Brooke Davis  WUJ:[id number] DOB: 1940-11-19 DOA: 01/27/2023 PCP: [name], MD    Brief Narrative:  82 y.o. female with medical history significant for type 2 diabetes mellitus, hypertension, dyslipidemia, coronary artery disease status post PCI and stent, diastolic dysfunction, and anxiety, who presented to the emergency room with acute onset of altered mental status with confusion after waking up from a nap at 3 PM.  She started her nap at her baseline around 1 PM.  She was having slurred speech and expressive aphasia as well as right arm weakness and neglect.  She just had a right hip surgery on 7/1.  She denies any paresthesias.  No weakness seizures.  No tinnitus or vertigo.  She has been having constipation and low back pain.  She denies any fever or chills.  No chest pain or palpitations.  No cough or wheezing.  No dysuria, oliguria or hematuria or flank pain.   Symptoms had initially improved the emergency room however patient had a deterioration in neurologic function early a.m. on 7/11.  Repeat head CT without significant interval change.  Case discussed with neurology.  Patient acute worsening of neurologic status on 7/12.  Taken emergently for CAT scan.  No hemorrhage.  Follow-up MRI in 7/13 demonstrates possible new areas of infarct.   Assessment & Plan:   Principal Problem:   Embolic stroke [facility]) Active Problems:   Chronic atrial fibrillation with RVR (HCC)   Chronic diastolic CHF (congestive heart failure) (HCC)   Dyslipidemia   Gout   Anxiety and depression   Right arm weakness   Acute CVA (cerebrovascular accident) (HCC)   Bilateral carotid artery stenosis  Acute CVA Bilateral carotid artery stenosis left greater than right Embolic appearing however distribution of infarct is with in the left carotid artery territory.  Raises suspicion for carotid artery stenosis as primary driving factor.  This is not necessarily being considered an Eliquis  treatment failure. Echocardiogram reassuring.  Negative bubble Deterioration in neurologic status noted 7/13 Plan: Continue apixaban 5 mg daily Continue aspirin 81 mg daily Continue high intensity statin PT OT ST as tolerated Neurology follow-up Vascular is consulted.  Discussed with vascular service 7/15 to discuss possibly moving up intervention if within family's goals of care.  Chronic atrial fibrillation with RVR (HCC) PTA Eliquis and Lopressor   Chronic diastolic CHF (congestive heart failure) (HCC) Restart demedex and entresto Continue Lopressor   Anxiety and depression PTA Xanax and Celexa   Gout PTA allopurinol   Dyslipidemia High intensity statin  Functional decline Palliative care evaluation, patient remains full scope of care   DVT prophylaxis: Apixaban Code Status: Full Family Communication: Daughter at bedside 7/11, 7/12, 7/13 Disposition Plan: Status is: Inpatient Remains inpatient appropriate because: Acute CVA     Level of care: Telemetry Medical  Consultants:  Neurology  Procedures:  None  Antimicrobials: None   Subjective: Seen and examined.  Patient more sleepy this morning, recently received atarax  Objective: Vitals:   01/30/23 0136 01/30/23 0423 01/30/23 0823 01/30/23 1245  BP: 125/63 (!) 149/62 (!) 158/67 (!) 154/71  Pulse: 74 77 77 67  Resp:  18 17   Temp:  98.4 F (36.9 C) 98.9 F (37.2 C) 98.9 F (37.2 C)  TempSrc:   Oral   SpO2: 97% 100% 98% 100%  Weight:      Height:        Intake/Output Summary (Last 24 hours) at 01/30/2023 1324 Last data filed at 01/30/2023 [phone]  Gross per 24 hour  Intake --  Output 950 ml  Net -950 ml   Filed Weights   01/27/23 1819 01/28/23 0100  Weight: 79.4 kg 87.6 kg    Examination:  General exam: Sleepy today Respiratory system: Lungs clear.  Normal work of breathing.  Room air Cardiovascular system: S1-S2, RRR, 2/6 murmur Gastrointestinal system: Soft, NT/ND, normal bowel  sounds Central nervous system: Alert.  Oriented x 2.  Right-sided weakness.  Right facial droop Extremities: RUE and RLE weakness Skin: No rashes, lesions or ulcers Psychiatry: Judgement and insight appear impaired. Mood & affect confused.     Data Reviewed: I have personally reviewed following labs and imaging studies  CBC: Recent Labs  Lab 01/27/23 1827 01/28/23 0400  WBC 8.3 7.7  HGB 8.8* 8.6*  HCT 28.1* 27.0*  MCV 101.1* 101.1*  PLT 311 295   Basic Metabolic Panel: Recent Labs  Lab 01/27/23 1827 01/28/23 0400  NA 134* 136  K 4.5 4.5  CL 100 104  CO2 25 27  GLUCOSE 123* 124*  BUN 15 14  CREATININE 0.99 1.03*  CALCIUM 8.8* 8.7*   GFR: Estimated Creatinine Clearance: 45.5 mL/min (A) (by C-G formula based on SCr of 1.03 mg/dL (H)). Liver Function Tests: Recent Labs  Lab 01/27/23 1827  AST 23  ALT 18  ALKPHOS 98  BILITOT 0.7  PROT 6.3*  ALBUMIN 2.7*   Recent Labs  Lab 01/27/23 1827  LIPASE 34   No results for input(s): "AMMONIA" in the last 168 hours. Coagulation Profile: No results for input(s): "INR", "PROTIME" in the last 168 hours. Cardiac Enzymes: No results for input(s): "CKTOTAL", "CKMB", "CKMBINDEX", "TROPONINI" in the last 168 hours. BNP (last 3 results) No results for input(s): "PROBNP" in the last 8760 hours. HbA1C: No results for input(s): "HGBA1C" in the last 72 hours. CBG: Recent Labs  Lab 01/29/23 0833 01/29/23 1205 01/29/23 1607 01/30/23 0821 01/30/23 1241  GLUCAP 115* 168* 140* 117* 133*   Lipid Profile: Recent Labs    01/28/23 0400  CHOL 86  HDL 29*  LDLCALC 46  TRIG 55  CHOLHDL 3.0   Thyroid Function Tests: No results for input(s): "TSH", "T4TOTAL", "FREET4", "T3FREE", "THYROIDAB" in the last 72 hours. Anemia Panel: No results for input(s): "VITAMINB12", "FOLATE", "FERRITIN", "TIBC", "IRON", "RETICCTPCT" in the last 72 hours. Sepsis Labs: No results for input(s): "PROCALCITON", "LATICACIDVEN" in the last 168  hours.  No results found for this or any previous visit (from the past 240 hour(s)).       Radiology Studies: MR BRAIN WO CONTRAST  Result Date: 01/29/2023 CLINICAL DATA:  Stroke, follow-up EXAM: MRI HEAD WITHOUT CONTRAST TECHNIQUE: Multiplanar, multiecho pulse sequences of the brain and surrounding structures were obtained without intravenous contrast. COMPARISON:  01/27/2023 MRI head, correlation is made with 01/29/2023 CT head FINDINGS: Brain: Again noted are scattered foci of restricted diffusion with ADC correlates in the left frontal, parietal, temporal, and occipital cortex and white matter and left caudate; several of these have increased in conspicuity since the prior exam in the vein consistent with acute infarcts. Likely new small acute focus of restricted diffusion with ADC correlate in the left corona radiata (series 9, image 27). No acute hemorrhage, mass, mass effect, or midline shift. No hydrocephalus or extra-axial collection. Confluent T2 hyperintense signal in the periventricular white matter and pons, likely the sequela of moderate to severe chronic small vessel ischemic disease. Dilated perivascular spaces in the bilateral basal ganglia. Vascular: No seen on the prior CTA, poor  visualization of the right V4 flow void. Otherwise normal arterial flow voids. Skull and upper cervical spine: Normal marrow signal. Sinuses/Orbits: Clear paranasal sinuses. No acute finding in the orbits. Status post bilateral lens replacements. Other: The mastoid air cells are well aerated. 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 Otherwise unchanged scattered acute to early subacute infarcts throughout the left cerebral hemisphere. No acute hemorrhage, mass effect, or midline shift. These results will be called to the ordering clinician or representative by the Radiologist Assistant, and communication documented in the PACS or [employer]. Electronically Signed   By: Wiliam Ke  M.D.   On: 01/29/2023 23:58   CT ANGIO HEAD NECK W WO CM (CODE STROKE)  Result Date: 01/29/2023 CLINICAL DATA:  Neuro deficit, stroke suspected. Abnormal MRI with left-sided infarcts. EXAM: CT ANGIOGRAPHY HEAD AND NECK WITH AND WITHOUT CONTRAST TECHNIQUE: Multidetector CT imaging of the head and neck was performed using the standard protocol during bolus administration of intravenous contrast. Multiplanar CT image reconstructions and MIPs were obtained to evaluate the vascular anatomy. Carotid stenosis measurements (when applicable) are obtained utilizing NASCET criteria, using the distal internal carotid diameter as the denominator. RADIATION DOSE REDUCTION: This exam was performed according to the departmental dose-optimization program which includes automated exposure control, adjustment of the mA and/or kV according to patient size and/or use of iterative reconstruction technique. CONTRAST:  75mL OMNIPAQUE IOHEXOL 350 MG/ML SOLN COMPARISON:  MR head without contrast 01/27/2023. CT head without contrast 01/28/2023 and CT head without contrast 01/29/2023. FINDINGS: CTA NECK FINDINGS Aortic arch: Atherosclerotic calcifications are again noted at the aortic arch and great vessel origins. No stenosis or aneurysm is present. Right carotid system: The right common carotid artery is within normal limits. Mural calcifications are stable. The high-grade stenosis of greater than 80% is present at the proximal right ICA. Previous study underestimated the degree of stenosis. No significant interval change is present. Left carotid system: The left common carotid artery demonstrates extensive mural calcification without proximal stenosis. A high-grade, near occlusive stenosis is present in the proximal left ICA. More distal left ICA is slightly smaller in caliber than the right. Vertebral arteries: The vertebral arteries both originate from the subclavian arteries. The left vertebral artery is dominant. A 50% stenosis  present at its origin. The right vertebral artery is hypoplastic. It is occluded in the V3 segment is proximal to the dura. No significant stenosis is present on the left. Skeleton: Multilevel degenerative changes are again noted within the cervical spine. No focal osseous lesions are present. Other neck: Soft tissues the neck are otherwise unremarkable. Salivary glands are within normal limits. Thyroid is normal. No significant adenopathy is present. No focal mucosal or submucosal lesions are present. Upper chest: Small bilateral pleural effusions are present. Mild dependent atelectasis is present. No nodule or mass lesion is present. No significant airspace disease is present otherwise. Review of the MIP images confirms the above findings CTA HEAD FINDINGS Anterior circulation: Atherosclerotic calcifications are again noted within the cavernous internal carotid arteries bilaterally. No significant stenosis or interval change is present. The A1 and M1 segments are normal bilaterally. The anterior communicating artery is patent. MCA bifurcations within normal limits bilaterally. The ACA and MCA branch vessels are within normal limits. No aneurysm is present. Posterior circulation: Left vertebral artery is scratched at atherosclerotic changes are present at the dural margin of the left vertebral artery without a significant stenosis. Left PICA origin is visualized and normal. The right V4 segment  fills to the PICA, likely in retrograde fashion. The basilar artery is normal. Both superior cerebellar arteries are patent. The left posterior cerebral artery originates from basilar tip. The right posterior cerebral artery is of fetal type with a small right P1 segment. A proximal right P2 segment stenosis is again noted. In mid left P2 segment stenosis is present. PCA branch vessels are otherwise unremarkable and stable. Venous sinuses: The dural sinuses are patent. The straight sinus and deep cerebral veins are intact.  Cortical veins are within normal limits. No significant vascular malformation is evident. Anatomic variants: Fetal type right posterior cerebral artery. Review of the MIP images confirms the above findings IMPRESSION: 1. 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 2. 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 3. Stable 50% stenosis at the origin of the dominant left vertebral artery. 4. 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 5. Stable proximal right P2 segment stenosis. 6. Stable mid left P2 segment stenosis. 7. Stable atherosclerotic changes within the cavernous internal carotid arteries bilaterally without significant stenosis. 8. No significant proximal stenosis, aneurysm, or branch vessel occlusion within the Circle of Willis. 9.  Aortic Atherosclerosis (ICD10-I70.0). These results were called by telephone at the time of interpretation on 01/29/2023 at 4:31 Pm to provider MCNE[facility] , who verbally acknowledged these results. The final report was delayed by technical issues. Electronically Signed   By: Marin Roberts M.D.   On: 01/29/2023 17:50   CT HEAD CODE STROKE WO CONTRAST  Result Date: 01/29/2023 CLINICAL DATA:  Code stroke.  Neuro deficit, acute, stroke suspected EXAM: CT HEAD WITHOUT CONTRAST TECHNIQUE: Contiguous axial images were obtained from the base of the skull through the vertex without intravenous contrast. RADIATION DOSE REDUCTION: This exam was performed according to the departmental dose-optimization program which includes automated exposure control, adjustment of the mA and/or kV according to patient size and/or use of iterative reconstruction technique. COMPARISON:  None Available. FINDINGS: Brain: No evidence of acute infarction, hemorrhage, hydrocephalus, extra-axial collection or mass lesion/mass effect. Patchy white matter hypodensities, nonspecific but compatible  with chronic microvascular ischemic disease. Vascular: No hyperdense vessel. Skull: No acute fracture. Sinuses/Orbits: Clear sinuses.  No acute orbital findings. Other: No mastoid effusions. ASPECTS [facility] Stroke Program Early CT Score) total score (0-10 with 10 being normal): 10. IMPRESSION: 1. No evidence of acute intracranial abnormality. 2. ASPECTS is 10. Code stroke imaging results were communicated on 01/29/2023 at 3:58 pm to provider Dr. Amada Jupiter via secure text paging. Electronically Signed   By: Feliberto Harts M.D.   On: 01/29/2023 15:58        Scheduled Meds:  allopurinol  300 mg Oral Daily   apixaban  5 mg Oral BID   aspirin EC  81 mg Oral Daily   atorvastatin  80 mg Oral Daily   citalopram  10 mg Oral Daily   feeding supplement  237 mL Oral BID BM   metoprolol tartrate  25 mg Oral BID   polyethylene glycol  17 g Oral Daily   potassium chloride  10 mEq Oral Daily   senna-docusate  1 tablet Oral BID   Continuous Infusions:     LOS: 2 days    Tresa Moore, MD [facility]   If 7PM-7AM, please contact night-coverage  01/30/2023, 1:24 PM

## 2023-01-30 NOTE — Progress Notes (Signed)
Inpatient Rehab Admissions:  Inpatient Rehab Consult received.  Attempted to contact pt regarding CIR but she was asleep. I spoke with patient's daughter [name] on the telephone for rehabilitation assessment and to discuss goals and expectations of an inpatient rehab admission.  Discussed average length of stay, insurance authorization requirement, discharge home after completion of CIR. She acknowledged understanding and is interested in pt pursuing CIR. She confirmed that family will be able to provide 24/7 support for pt after discharge. Will continue to follow for medical readiness and progress with therapies.  Signed: Wolfgang Phoenix, MS, CCC-SLP Admissions Coordinator [phone]

## 2023-01-30 NOTE — Progress Notes (Addendum)
Occupational Therapy Treatment Patient Details Name: Brooke Davis MRN: [id number] DOB: 16-Jun-1941 Today's Date: 01/30/2023   History of present illness Pt is a 82 year old female presenting to the ED with AMS and confusion, admitted with left cerebral hemispheric and left caudate infarctions in the setting of chronic atrial fibrillation; PMH significant for type 2 diabetes mellitus, hypertension, dyslipidemia, coronary artery disease status post PCI and stent, diastolic dysfunction, and anxiety; Code stroke called 01/29/23 with afer worsening of neurologic status on 7/12. New small acute infarct in the left corona radiata.   OT comments  Chart reviewed to date, noted that code stroke occurred yesterday afternoon. Goals reviewed and remain appropriate for patient. Pt is in bed, sleeping however easily woken and agreeable to OT tx session targeting improving functional activity tolerance in the setting of mobility/ADL tasks. Pt required +1 on this date (vs +2 previous tx session) for step pivot to bedside chair with MIN A with RW, step by step vcs. Grooming tasks completed with MIN A due to R hand weakness. Pt is left in chair,a ll needs met. OT will continue to follow acutely.    Recommendations for follow up therapy are one component of a multi-disciplinary discharge planning process, led by the attending physician.  Recommendations may be updated based on patient status, additional functional criteria and insurance authorization.    Assistance Recommended at Discharge Frequent or constant Supervision/Assistance  Patient can return home with the following  A lot of help with walking and/or transfers;A lot of help with bathing/dressing/bathroom;Assistance with cooking/housework;Direct supervision/assist for medications management;Direct supervision/assist for financial management;Help with stairs or ramp for entrance   Equipment Recommendations  Other (comment) (per next venue of care)     Recommendations for Other Services      Precautions / Restrictions Precautions Precautions: Posterior Hip;Fall Restrictions Weight Bearing Restrictions: Yes RLE Weight Bearing: Weight bearing as tolerated       Mobility Bed Mobility Overal bed mobility: Needs Assistance Bed Mobility: Supine to Sit     Supine to sit: Min assist, HOB elevated          Transfers Overall transfer level: Needs assistance Equipment used: Rolling walker (2 wheels) Transfers: Sit to/from Stand Sit to Stand: Min assist           General transfer comment: with RW, frequent vcs for technique     Balance Overall balance assessment: Needs assistance Sitting-balance support: Feet supported Sitting balance-Leahy Scale: Fair     Standing balance support: Bilateral upper extremity supported, Reliant on assistive device for balance, During functional activity Standing balance-Leahy Scale: Poor                             ADL either performed or assessed with clinical judgement   ADL Overall ADL's : Needs assistance/impaired Eating/Feeding: Minimal assistance;Sitting Eating/Feeding Details (indicate cue type and reason): due to moderate R hand weakness; pt able to hold cup and bring to her mouth 1 attempt Grooming: ;Sitting;Set up;Minimal assistance                   Toilet Transfer: Minimal assistance;Rolling walker (2 wheels) Toilet Transfer Details (indicate cue type and reason): step pivot to bedside chair with RW                Extremity/Trunk Assessment Upper Extremity Assessment RUE Deficits / Details: AROM: RUE shoulder flexion to approx 100 degrees, elbow approx 3/4 full AROM, wrist 1/2  full AROM; grip strength moderately impaired RUE Sensation: decreased proprioception RUE Coordination: decreased fine motor;decreased gross motor   Lower Extremity Assessment RLE Deficits / Details: 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   Cervical / Trunk Assessment Cervical / Trunk  Assessment: Normal    Vision Baseline Vision/History: 1 Wears glasses Patient Visual Report:  (will continue to assess, HOH ?impeding funcitonal vision test on this date) Vision Assessment?: Yes Ocular Range of Motion: Within Functional Limits   Perception Perception Perception: Impaired       Cognition Arousal/Alertness: Awake/alert Behavior During Therapy: Flat affect Overall Cognitive Status: Impaired/Different from baseline Area of Impairment: Orientation, Attention, Memory, Following commands, Safety/judgement, Awareness, Problem solving                 Orientation Level: Disoriented to, Time, Situation Current Attention Level: Focused Memory: Decreased short-term memory, Decreased recall of precautions Following Commands: Follows one step commands consistently, Follows one step commands with increased time Safety/Judgement: Decreased awareness of safety, Decreased awareness of deficits Awareness: Emergent Problem Solving: Slow processing, Decreased initiation, Difficulty sequencing, Requires verbal cues, Requires tactile cues                     Pertinent Vitals/ Pain       Pain Assessment Pain Assessment: No/denies pain  Home Living   Living Arrangements: Other relatives (grandson and great-grandchildren) Available Help at Discharge: Family;Available 24 hours/day Type of Home: House Home Access: Ramped entrance     Home Layout: One level     Bathroom Shower/Tub: Chief Strategy Officer: Handicapped height Bathroom Accessibility: Yes How Accessible: Accessible via walker        Lives With: Family (grandson and Art gallery manager)        Frequency  Min 1X/week        Progress Toward Goals  OT Goals(current goals can now be found in the care plan section)  Progress towards OT goals: Progressing toward goals     Plan Discharge plan remains appropriate;Frequency remains appropriate       AM-PAC OT "6 Clicks" Daily  Activity     Outcome Measure   Help from another person eating meals?: A Little Help from another person taking care of personal grooming?: A Little Help from another person toileting, which includes using toliet, bedpan, or urinal?: A Lot Help from another person bathing (including washing, rinsing, drying)?: A Lot Help from another person to put on and taking off regular upper body clothing?: A Lot Help from another person to put on and taking off regular lower body clothing?: A Lot 6 Click Score: 14    End of Session Equipment Utilized During Treatment: Rolling walker (2 wheels)  OT Visit Diagnosis: Other abnormalities of gait and mobility (R26.89);Other symptoms and signs involving cognitive function;Other symptoms and signs involving the nervous system (R29.898);Hemiplegia and hemiparesis Hemiplegia - Right/Left: Right   Activity Tolerance Patient tolerated treatment well   Patient Left in chair;with call bell/phone within reach;with chair alarm set   Nurse Communication Mobility status        Time: 1610-9604 OT Time Calculation (min): 21 min  Charges: OT General Charges $OT Visit: 1 Visit OT Treatments $Self Care/Home Management : 8-22 mins Oleta Mouse, OTD OTR/L  01/30/23, 3:25 PM

## 2023-01-30 NOTE — PMR Pre-admission (Signed)
PMR Admission Coordinator Pre-Admission Assessment  Patient: Brooke Davis is an 82 y.o., female MRN: [id number] DOB: 1940-11-29 Height: 5\' 2"  (157.5 cm) Weight: 87.6 kg  Insurance Information HMO: yes    PPO:      PCP:      IPA:      80/20:      OTHER:  PRIMARY: UHC Medicare      Policy#: [id number]      Subscriber: patient CM Name: Asher Muir      Phone#: [phone] option #3     Fax#: [phone] Pre-Cert#: [id number]   approved 7/16 until 7/22   Employer:  Benefits:  Phone #: [phone]     Name:  Eff. Davis: 07/20/22-07/20/23     Deduct: does not have deductible      Out of Pocket Max: $4,500 ($324.60 met)      Life Max: NA CIR: $325/day co-pay for days 1-5, 100% coverage for days 6+     SNF: 100% coverage for days 1-20, $203/day co-pay for days 21-100 Outpatient: $20/visit co-pay     Co-Pay:  Home Health: 100% coverage      Co-Pay:  DME: 80% coverage     Co-Pay: 20% co-insurance Providers: in-network SECONDARY:       Policy#:      Phone#:   Artist:       Phone#:   The Engineer, materials Information Summary" for patients in Inpatient Rehabilitation Facilities with attached "Privacy Act Statement-Health Care Records" was provided and verbally reviewed with: Patient and Family  Emergency Contact Information Contact Information     Name Relation Home Work Mobile   Rawlings Daughter [phone]  [phone]   [facility] Daughter   [phone]      Other Contacts     Name Relation Home Work O[place] C Daughter [phone]  [phone]   Mila Merry [phone]  [phone]   Riddle,Susan Daughter [phone]      Current Medical History  Patient Admitting Diagnosis: CVA  History of Present Illness:  Brooke Davis is a 83 year old right-handed female with history of type 2 diabetes mellitus, hypertension, hyperlipidemia, atrial fibrillation maintained on Eliquis  followed by cardiology Dr.Shaukat Welton Flakes, 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 Francesco Sor, CAD status post PCI and stenting, diastolic congestive heart failure and anxiety.  Per chart review patient modified independent with rollator prior to admission.   Presented to [facility] 01/27/2023 with speech difficulty.  It was noted patient had been convalescing at home after recent hip surgery she had held her Eliquis for 3 days prior to that surgery.  Cranial CT scan negative for acute changes.  MRI limited by motion showing scattered foci of acute infarct in the left cerebral hemisphere and the left caudate.  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  50% stenosis of the origin of the dominant left vertebral artery.  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  No significant proximal stenosis aneurysm or branch vessel occlusion within the circle of Willis.  Patient did not receive TNK.  Admission chemistries unremarkable except sodium 134 glucose 123, hemoglobin 8.8.  Echo with ejection fraction of 60 to 65% no wall motion abnormalities.  Neurology follow-up patient currently remains on Eliquis as prior to admission with the addition of low-dose aspirin.  She is tolerating a regular consistency diet.  Vascular surgery was consulted in regards to bilateral carotid artery stenosis and plan outpatient follow-up.  Palliative care was consulted to establish goals of care.    Patient continues to be weightbearing right lower extremity after recent right total hip arthroplasty.   Complete NIHSS TOTAL: 1  Patient's medical record from [facility] has been reviewed by the rehabilitation admission coordinator and physician.  Past Medical History  Past Medical History:  Diagnosis Davis   Anxiety    a.) on BZO (alprazolam)  PRN   Arthritis    fingers, right hip   Atrial fibrillation (HCC) 11/10/2013   a.) CHA2DS2VASc = 5 (age x2, sex, HTN, T2DM);  b.) rate/rhythm maintained on oral metoprolol tartrate; chronically anticoagulated with apixaban   Bronchitis    Cerebrovascular small vessel disease (chronic)    Coronary artery disease involving native coronary artery of native heart 01/11/2021   a.) LHC 01/11/2021: 40% oLM, 50% mLCx, 30% oLCx, 30% pLAD, 30% mLAD, 99% pRCA (2.75 x 30 mm Resolute Onyx DES), 75% p-mRCA, 60% dRCA   DDD (degenerative disc disease), lumbar 12/01/2013   Diastolic dysfunction 01/12/2021   a.) TTE 01/12/2021 (s/p inf STEMI): EF 55-60%, mild-mod LAE, mild RAE, mod RVE, mild AR/MR, mod TR, G1DD   Diet-controlled type 2 diabetes mellitus (HCC)    Full dentures    Gout    History of bilateral cataract extraction 2021   Hyperlipidemia    Hypertension, essential    Long term current use of anticoagulant    a.) apixaban   PONV (postoperative nausea and vomiting)    Sinoatrial node dysfunction (HCC) 11/10/2013   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 Yes 7/1 hip surgery  Family History   family history includes Alcoholism in her brother; Diabetes in her father; Heart attack (age of onset: 65) in her mother; Stroke in her father.  Current Medications  Current Facility-Administered Medications:    acetaminophen (TYLENOL) tablet 650 mg, 650 mg, Oral, Q6H PRN **OR** acetaminophen (TYLENOL) suppository 650 mg, 650 mg, Rectal, Q6H PRN, Mansy, Jan A, MD   allopurinol (ZYLOPRIM) tablet 300 mg, 300 mg, Oral, Daily, Mansy, Jan A, MD, 300 mg at 02/02/23 1027   apixaban (ELIQUIS) tablet 5 mg, 5 mg, Oral, BID, Mansy, Jan A, MD, 5 mg at  02/02/23 1027   aspirin EC tablet 81 mg, 81 mg, Oral, Daily, Rejeana Brock, MD, 81 mg at 02/02/23 1027   atorvastatin (LIPITOR) tablet 80 mg, 80 mg, Oral, Daily, Mansy, Jan A, MD, 80 mg at 02/02/23 1027   bisacodyl (DULCOLAX) suppository 10 mg, 10 mg, Rectal, Daily PRN, Georgeann Oppenheim, Sudheer B, MD   citalopram (CELEXA) tablet 20 mg, 20 mg, Oral, Daily, Sreenath, Sudheer B, MD, 20 mg at 02/02/23 1027   clonazePAM (KLONOPIN) tablet 0.5 mg, 0.5 mg, Oral, TID PRN, Georgeann Oppenheim, Sudheer B, MD, 0.5 mg at 02/01/23 2106   feeding supplement (ENSURE ENLIVE / ENSURE PLUS) liquid 237 mL, 237 mL, Oral, BID BM, Sreenath, Sudheer B, MD, 237 mL at 02/02/23 1035   fluticasone (FLONASE) 50 MCG/ACT nasal spray 1 spray, 1 spray, Each Nare, Daily, Sreenath, Sudheer B, MD, 1 spray at 02/02/23 1027   liver oil-zinc  oxide (DESITIN) 40 % ointment, , Topical, TID PRN, Sreenath, Sudheer B, MD   melatonin tablet 5 mg, 5 mg, Oral, QHS, Sreenath, Sudheer B, MD   metoprolol tartrate (LOPRESSOR) tablet 25 mg, 25 mg, Oral, BID, Mansy, Jan A, MD, 25 mg at 02/02/23 1027   ondansetron (ZOFRAN) tablet 4 mg, 4 mg, Oral, Q6H PRN **OR** ondansetron (ZOFRAN) injection 4 mg, 4 mg, Intravenous, Q6H PRN, Mansy, Jan A, MD, 4 mg at 01/29/23 1610   oxyCODONE (Oxy IR/ROXICODONE) immediate release tablet 5 mg, 5 mg, Oral, Q4H PRN, Mansy, Jan A, MD, 5 mg at 01/27/23 2214   polyethylene glycol (MIRALAX / GLYCOLAX) packet 17 g, 17 g, Oral, Daily PRN, Sreenath, Sudheer B, MD   potassium chloride (KLOR-CON M) CR tablet 10 mEq, 10 mEq, Oral, Daily, Mansy, Jan A, MD, 10 mEq at 02/02/23 1027   sacubitril-valsartan (ENTRESTO) 24-26 mg per tablet, 1 tablet, Oral, BID, Georgeann Oppenheim, Sudheer B, MD, 1 tablet at 02/02/23 1027   senna-docusate (Senokot-S) tablet 1 tablet, 1 tablet, Oral, QHS, Sreenath, Sudheer B, MD   torsemide (DEMADEX) tablet 20 mg, 20 mg, Oral, Daily, Sreenath, Sudheer B, MD, 20 mg at 02/02/23 1027   traMADol (ULTRAM) tablet 50-100 mg, 50-100  mg, Oral, Q4H PRN, Mansy, Jan A, MD, 50 mg at 02/01/23 2228   traZODone (DESYREL) tablet 25 mg, 25 mg, Oral, QHS PRN, Mansy, Jan A, MD, 25 mg at 02/01/23 2106  Patients Current Diet:  Diet Order             Diet - low sodium heart healthy           Diet regular Fluid consistency: Thin  Diet effective now                  Precautions / Restrictions Precautions Precautions: Posterior Hip Precaution Booklet Issued: No Precaution Comments: Verbally reminded pt of posterior hip precautions Restrictions Weight Bearing Restrictions: Yes RLE Weight Bearing: Weight bearing as tolerated Other Position/Activity Restrictions: RLE WBAT   Has the patient had 2 or more falls or a fall with injury in the past year? No  Prior Activity Level Limited Community (1-2x/wk): doctor's appointments  Prior Functional Level Self Care: Did the patient need help bathing, dressing, using the toilet or eating? Needed some help  Indoor Mobility: Did the patient need assistance with walking from room to room (with or without device)? Independent  Stairs: Did the patient need assistance with internal or external stairs (with or without device)? Pt unable to utilize stairs after hip surgery  Functional Cognition: Did the patient need help planning regular tasks such as shopping or remembering to take medications? Independent  Patient Information Are you of Hispanic, Latino/a,or Spanish origin?: X. Patient unable to respond, A. No, not of Hispanic, Latino/a, or Spanish origin What is your race?: X. Patient unable to respond, A. White Do you need or want an interpreter to communicate with a doctor or health care staff?: 9. Unable to respond  Patient's Response To:  Health Literacy and Transportation Is the patient able to respond to health literacy and transportation needs?: No Health Literacy - How often do you need to have someone help you when you read instructions, pamphlets, or other written  material from your doctor or pharmacy?: Patient unable to respond (daughter reports pt able to read medical information prior to hospitalization) In the past 12 months, has lack of transportation kept you from medical appointments or from getting medications?:  (daughter reports pt hasn't missed  any medical appointments due to transportation issues) In the past 12 months, has lack of transportation kept you from meetings, work, or from getting things needed for daily living?:  (daughter reports pt hasn't missed any non-medical appointments due to transportation issues)  Journalist, newspaper / Equipment Home Assistive Devices/Equipment: Environmental consultant (specify type), Wheelchair, Blood pressure cuff, Hearing aid, Eyeglasses, Bedside commode/3-in-1, Shower chair without back, Dentures (specify type), Other (Comment) (pulse ox) Home Equipment: Toilet riser, BSC/3in1, Rollator (4 wheels), Wheelchair - manual, Rolling Walker (2 wheels), Grab bars - toilet, Grab bars - tub/shower, Shower seat, Other (comment)  Prior Device Use: Indicate devices/aids used by the patient prior to current illness, exacerbation or injury? Walker (rollator)  Current Functional Level Cognition  Arousal/Alertness: Awake/alert Overall Cognitive Status: History of cognitive impairments - at baseline Current Attention Level: Sustained Orientation Level: Oriented X4 Following Commands: Follows one step commands consistently Safety/Judgement: Decreased awareness of safety, Decreased awareness of deficits General Comments: Pt is very fatigued (family in room states she did not sleep last night), but able to state her needs ("give me just a moment"); quiet during session, although did state that NA did a good job yesterday. Attention: Focused Focused Attention: Appears intact Memory:  (will be further assessed) Awareness: Impaired Awareness Impairment: Intellectual impairment Problem Solving:  (will be further assessed) Executive  Function:  (will be further assessed) Behaviors:  (none) Safety/Judgment:  (will be further assessed)    Extremity Assessment (includes Sensation/Coordination)  Upper Extremity Assessment: Overall WFL for tasks assessed, Generalized weakness RUE Deficits / Details: RUE deficits appear to be improving. BIL UE strength and ROM WNL today. Slight impairment of coordination on finger-nose in RUE today. Per family at bedside, pt is L-handed. RUE Sensation: decreased proprioception RUE Coordination: decreased fine motor, decreased gross motor  Lower Extremity Assessment: Defer to PT evaluation RLE Deficits / Details: Pt appearing to be slightly buckling in RLE today during mobility    ADLs  Overall ADL's : Needs assistance/impaired Eating/Feeding: Minimal assistance, Sitting Eating/Feeding Details (indicate cue type and reason): due to moderate R hand weakness; pt able to hold cup and bring to her mouth 1 attempt Grooming: Set up, Sitting, Wash/dry face, Oral care Grooming Details (indicate cue type and reason): pt seated on BSC next to sink and OT handing her items Lower Body Bathing: Maximal assistance Lower Body Bathing Details (indicate cue type and reason): anticipated Lower Body Dressing: Maximal assistance Lower Body Dressing Details (indicate cue type and reason): OT donned pt socks at bed level Toilet Transfer: Minimal assistance, Rolling walker (2 wheels) Toilet Transfer Details (indicate cue type and reason): BSC brought behind pt when she fatigued from mobility Toileting- Clothing Manipulation and Hygiene: Sitting/lateral lean, Min guard Toileting - Clothing Manipulation Details (indicate cue type and reason): seated on BSC for urine hygiene Functional mobility during ADLs: Rolling walker (2 wheels), Minimal assistance, +2 for physical assistance General ADL Comments: Pt able to walk approx 8 feet from bed to standing near sink, then needing to sit for rest break; sat on [facility] for  grooming today.    Mobility  Overal bed mobility: Needs Assistance Bed Mobility: Supine to Sit Supine to sit: Min guard, HOB elevated Sit to supine: Min assist General bed mobility comments: min assist for LE's back into bed for comfort    Transfers  Overall transfer level: Needs assistance Equipment used: Rolling walker (2 wheels) Transfers: Sit to/from Stand Sit to Stand: Min assist (second person for safety) Bed to/from chair/wheelchair/BSC transfer type::  Step pivot, Lateral/scoot transfer Step pivot transfers: Min guard  Lateral/Scoot Transfers: Supervision General transfer comment: Pt not reaching back for surface before sitting; pt with BIL hands on RW during sit to stand. Will need increased training for safety in transfers and mobility    Ambulation / Gait / Stairs / Wheelchair Mobility  Ambulation/Gait Ambulation/Gait assistance: Min assist, +2 safety/equipment Gait Distance (Feet): 4 Feet Assistive device: Rolling walker (2 wheels) Gait Pattern/deviations: Decreased stride length, Decreased step length - right, Decreased step length - left, Step-through pattern General Gait Details: able to initiate walking towrds sink in room but unable to fully make distance Gait velocity: decreased    Posture / Balance Dynamic Sitting Balance Sitting balance - Comments: supervision static sitting EOB Balance Overall balance assessment: Needs assistance Sitting-balance support: Feet supported Sitting balance-Leahy Scale: Good Sitting balance - Comments: supervision static sitting EOB Standing balance support: Bilateral upper extremity supported, Reliant on assistive device for balance, During functional activity Standing balance-Leahy Scale: Fair Standing balance comment: +2 for safety with walker    Special needs/care consideration Skin Surgical incision: thigh/anterior,right, Bladder Incontinence    Previous Home Environment  Living Arrangements: Other relatives (grandson and  Art gallery manager)  Lives With: Family (grandson and Art gallery manager) Available Help at Discharge: Family, Available 24 hours/day Type of Home: House Home Layout: One level Home Access: Ramped entrance Bathroom Shower/Tub: Engineer, manufacturing systems: Handicapped height Bathroom Accessibility: Yes How Accessible: Accessible via walker Home Care Services: No (was recieving outpatient PT)  Discharge Living Setting Plans for Discharge Living Setting: Patient's home Type of Home at Discharge: House Discharge Home Layout: One level Discharge Home Access: Ramped entrance Discharge Bathroom Shower/Tub: Tub/shower unit Discharge Bathroom Toilet: Handicapped height Discharge Bathroom Accessibility: Yes How Accessible: Accessible via walker Does the patient have any problems obtaining your medications?: Yes (Describe) (expensive)  Social/Family/Support Systems Anticipated Caregiver: [name] (daughter) and other family Anticipated Caregiver's Contact Information: [name]: [phone] Caregiver Availability: 24/7 Discharge Plan Discussed with Primary Caregiver: Yes Is Caregiver In Agreement with Plan?: Yes Does Caregiver/Family have Issues with Lodging/Transportation while Pt is in Rehab?: No  Daughter, Lavonna Rua is with patient during the day and grandson otherwise  Goals Patient/Family Goal for Rehab: Supervision: PT, Supervision-Min A:OT/ST, Expected length of stay: 7-10 days Pt/Family Agrees to Admission and willing to participate: Yes Program Orientation Provided & Reviewed with Pt/Caregiver Including Roles  & Responsibilities: Yes  Decrease burden of Care through IP rehab admission: NA  Possible need for SNF placement upon discharge: Not anticipated  Patient Condition: I have reviewed medical records from [facility], spoken with CSW, and daughter. I discussed via phone for inpatient rehabilitation assessment.  Patient will benefit from ongoing PT, OT,  and SLP, can actively participate in 3 hours of therapy a day 5 days of the week, and can make measurable gains during the admission.  Patient will also benefit from the coordinated team approach during an Inpatient Acute Rehabilitation admission.  The patient will receive intensive therapy as well as Rehabilitation physician, nursing, social worker, and care management interventions.  Due to bladder management, safety, skin/wound care, disease management, medication administration, pain management, and patient education the patient requires 24 hour a day rehabilitation nursing.  The patient is currently min to mod assist overall with mobility and basic ADLs.  Discharge setting and therapy post discharge at home with home health is anticipated.  Patient has agreed to participate in the Acute Inpatient Rehabilitation Program and will admit today.  Preadmission Screen Completed By:  Lauren Luvenia Starch, 02/02/2023 12:37 PM ______________________________________________________________________   Discussed status with Dr. Berline Chough on 02/02/23 at 1237 and received approval for admission today.  Admission Coordinator:  Domingo Pulse, CCC-SLP, time 1237 Davis 02/02/23   Assessment/Plan: Diagnosis: L corona radiatia stroke-  Does the need for close, 24 hr/day Medical supervision in concert with the patient's rehab needs make it unreasonable for this patient to be served in a less intensive setting? 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 Yes Does the patient require coordinated care of a physician, rehab nurse, PT, OT, and SLP to address physical and functional deficits in the context  of the above medical diagnosis(es)? Yes Addressing deficits in the following areas: balance, endurance, locomotion, strength, transferring, bowel/bladder control, bathing, dressing, feeding, grooming, toileting, cognition, speech, language, and swallowing Can the patient actively participate in an intensive therapy program of at least 3 hrs of therapy 5 days a week? Yes The potential for patient to make measurable gains while on inpatient rehab is good Anticipated functional outcomes upon discharge from inpatient rehab: supervision PT, supervision and min assist OT, supervision and min assist SLP Estimated rehab length of stay to reach the above functional goals is: 7-10 days Anticipated discharge destination: Home 10. Overall Rehab/Functional Prognosis: good   MD Signature:

## 2023-01-30 NOTE — Progress Notes (Addendum)
SLP Cancellation Note  Patient Details Name: Brooke Davis MRN: [id number] DOB: 1941-06-03   Cancelled treatment:       Reason Eval/Treat Not Completed: Patient declined, no reason specified. Pt declined as pt's daughter reported pt given medication for anxiety this AM and now started to "calm down." Pt becoming increasingly lethargic while conversing briefly with SLP. Pt and daughter did not waxing/waning wordfinding. During brief conversational exchanges, paucity of speech and anomia noted with inconsistent repair of breakdowns. No s/sx dysarthria appreciated. SLP to continue efforts as appropriate.  Clyde Canterbury, M.S., CCC-SLP Speech-Language Pathologist [facility] [phone] (ASCOM)  Woodroe Chen 01/30/2023, 9:45 AM

## 2023-01-30 NOTE — Progress Notes (Signed)
RR Note: Patient in CT with Boubacar RN, CT tech, Dr. Amada Jupiter. Patient with increased stroke symptoms. CT and patient evaluated by Dr. Amada Jupiter and patient returned to her room with no intervention needed at this time.

## 2023-01-30 NOTE — Plan of Care (Signed)
  Problem: Pain Management: Goal: Pain level will decrease with appropriate interventions Outcome: Progressing   Problem: Skin Integrity: Goal: Will show signs of wound healing Outcome: Progressing   Problem: Clinical Measurements: Goal: Postoperative complications will be avoided or minimized Outcome: Progressing   Problem: Activity: Goal: Ability to avoid complications of mobility impairment will improve Outcome: Progressing Goal: Ability to tolerate increased activity will improve 01/30/2023 0710 by Julieanne Manson, RN Outcome: Progressing 01/30/2023 0334 by Julieanne Manson, RN Outcome: Progressing   Problem: Ischemic Stroke/TIA Tissue Perfusion: Goal: Complications of ischemic stroke/TIA will be minimized Outcome: Progressing   Problem: Coping: Goal: Will verbalize positive feelings about self 01/30/2023 0710 by Julieanne Manson, RN Outcome: Progressing 01/30/2023 0334 by Julieanne Manson, RN Outcome: Progressing Goal: Will identify appropriate support needs 01/30/2023 0710 by Julieanne Manson, RN Outcome: Progressing 01/30/2023 0334 by Julieanne Manson, RN Outcome: Progressing   Problem: Clinical Measurements: Goal: Will remain free from infection Outcome: Progressing   Problem: Clinical Measurements: Goal: Diagnostic test results will improve Outcome: Progressing

## 2023-01-31 DIAGNOSIS — I2510 Atherosclerotic heart disease of native coronary artery without angina pectoris: Secondary | ICD-10-CM | POA: Diagnosis not present

## 2023-01-31 DIAGNOSIS — I482 Chronic atrial fibrillation, unspecified: Secondary | ICD-10-CM | POA: Diagnosis not present

## 2023-01-31 DIAGNOSIS — I639 Cerebral infarction, unspecified: Secondary | ICD-10-CM | POA: Diagnosis not present

## 2023-01-31 DIAGNOSIS — Z7189 Other specified counseling: Secondary | ICD-10-CM | POA: Diagnosis not present

## 2023-01-31 DIAGNOSIS — I6389 Other cerebral infarction: Secondary | ICD-10-CM | POA: Diagnosis not present

## 2023-01-31 DIAGNOSIS — Z01810 Encounter for preprocedural cardiovascular examination: Secondary | ICD-10-CM | POA: Diagnosis not present

## 2023-01-31 DIAGNOSIS — I63412 Cerebral infarction due to embolism of left middle cerebral artery: Secondary | ICD-10-CM | POA: Diagnosis not present

## 2023-01-31 MED ORDER — POLYETHYLENE GLYCOL 3350 17 G PO PACK: 17.0000 g | PACK | Freq: Every day | ORAL | Status: DC | PRN

## 2023-01-31 MED ORDER — CITALOPRAM HYDROBROMIDE 20 MG PO TABS
20.0000 mg | ORAL_TABLET | Freq: Every day | ORAL | Status: DC
  Administered 2023-02-01 – 2023-02-02 (×2): 20 mg via ORAL
  Filled 2023-01-31 (×2): qty 1

## 2023-01-31 MED ORDER — ZINC OXIDE 40 % EX OINT: TOPICAL_OINTMENT | Freq: Three times a day (TID) | CUTANEOUS | Status: DC | PRN

## 2023-01-31 MED ORDER — SENNOSIDES-DOCUSATE SODIUM 8.6-50 MG PO TABS
1.0000 | ORAL_TABLET | Freq: Every day | ORAL | Status: DC
  Filled 2023-01-31: qty 1

## 2023-01-31 NOTE — Progress Notes (Signed)
SUBJECTIVE: Patient is alert and oriented denies any complaints.   Vitals:   01/30/23 2112 01/31/23 0518 01/31/23 0741 01/31/23 0901  BP: (!) 155/64 139/61 (!) 148/60 134/79  Pulse: 80 80 73 75  Resp: 20 17 16    Temp: 99.1 F (37.3 C) 98.3 F (36.8 C) 99 F (37.2 C)   TempSrc: Oral Oral    SpO2: 98% 98% 96%   Weight:      Height:        Intake/Output Summary (Last 24 hours) at 01/31/2023 1158 Last data filed at 01/31/2023 0519 Gross per 24 hour  Intake --  Output 500 ml  Net -500 ml    LABS: Basic Metabolic Panel: No results for input(s): "NA", "K", "CL", "CO2", "GLUCOSE", "BUN", "CREATININE", "CALCIUM", "MG", "PHOS" in the last 72 hours. Liver Function Tests: No results for input(s): "AST", "ALT", "ALKPHOS", "BILITOT", "PROT", "ALBUMIN" in the last 72 hours. No results for input(s): "LIPASE", "AMYLASE" in the last 72 hours. CBC: No results for input(s): "WBC", "NEUTROABS", "HGB", "HCT", "MCV", "PLT" in the last 72 hours. Cardiac Enzymes: No results for input(s): "CKTOTAL", "CKMB", "CKMBINDEX", "TROPONINI" in the last 72 hours. BNP: Invalid input(s): "POCBNP" D-Dimer: No results for input(s): "DDIMER" in the last 72 hours. Hemoglobin A1C: No results for input(s): "HGBA1C" in the last 72 hours. Fasting Lipid Panel: No results for input(s): "CHOL", "HDL", "LDLCALC", "TRIG", "CHOLHDL", "LDLDIRECT" in the last 72 hours. Thyroid Function Tests: No results for input(s): "TSH", "T4TOTAL", "T3FREE", "THYROIDAB" in the last 72 hours.  Invalid input(s): "FREET3" Anemia Panel: No results for input(s): "VITAMINB12", "FOLATE", "FERRITIN", "TIBC", "IRON", "RETICCTPCT" in the last 72 hours.   PHYSICAL EXAM General: Well developed, well nourished, in no acute distress HEENT:  Normocephalic and atramatic Neck:  No JVD.  Lungs: Clear bilaterally to auscultation and percussion. Heart: HRRR . Normal S1 and S2 without gallops or murmurs.  Abdomen: Bowel sounds are positive, abdomen  soft and non-tender  Msk:  Back normal, normal gait. Normal strength and tone for age. Extremities: No clubbing, cyanosis or edema.   Neuro: Alert and oriented X 3. Psych:  Good affect, responds appropriately  TELEMETRY: Not on monitor but EKG shows A-fib  ASSESSMENT AND PLAN: #1 atrial fibrillation, currently on Eliquis and rate is well-controlled.  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  Patient had dysarthria and right-sided weakness and question is due to failure of Eliquis or due to bilateral carotid stenosis.  CTA carotid revealed bilateral high-grade stenosis with left greater than the right either endarterectomy or carotid stenting.  #3 patient has history of non-STEMI 3 years ago requiring PCI and stenting but denies any chest pain or shortness of breath.  #4 patient is high risk for vascular surgery such as endarterectomy but is having recurrent strokes and most likely due to carotid disease.  Ideal situation would be to do carotid stenting instead of endarterectomy.  Advise proceeding with carotid intervention.   ICD-10-CM   1. Right arm weakness  R29.898       Principal Problem:   Embolic stroke [facility]) Active Problems:   Chronic atrial fibrillation with RVR (HCC)   Dyslipidemia   Chronic diastolic CHF (congestive heart failure) (HCC)   Gout   Anxiety and depression   Right arm weakness   Acute CVA (cerebrovascular accident) (HCC)   Bilateral carotid artery stenosis    Adrian Blackwater, MD, [facility] 01/31/2023 11:58 AM

## 2023-01-31 NOTE — Progress Notes (Signed)
PROGRESS NOTE    Brooke Davis  FAO:[id number] DOB: 1941-01-01 DOA: 01/27/2023 PCP: [name], MD    Brief Narrative:  82 y.o. female with medical history significant for type 2 diabetes mellitus, hypertension, dyslipidemia, coronary artery disease status post PCI and stent, diastolic dysfunction, and anxiety, who presented to the emergency room with acute onset of altered mental status with confusion after waking up from a nap at 3 PM.  She started her nap at her baseline around 1 PM.  She was having slurred speech and expressive aphasia as well as right arm weakness and neglect.  She just had a right hip surgery on 7/1.  She denies any paresthesias.  No weakness seizures.  No tinnitus or vertigo.  She has been having constipation and low back pain.  She denies any fever or chills.  No chest pain or palpitations.  No cough or wheezing.  No dysuria, oliguria or hematuria or flank pain.   Symptoms had initially improved the emergency room however patient had a deterioration in neurologic function early a.m. on 7/11.  Repeat head CT without significant interval change.  Case discussed with neurology.  Patient acute worsening of neurologic status on 7/12.  Taken emergently for CAT scan.  No hemorrhage.  Follow-up MRI in 7/13 demonstrates possible new areas of infarct.   Assessment & Plan:   Principal Problem:   Embolic stroke [facility]) Active Problems:   Chronic atrial fibrillation with RVR (HCC)   Chronic diastolic CHF (congestive heart failure) (HCC)   Dyslipidemia   Gout   Anxiety and depression   Right arm weakness   Acute CVA (cerebrovascular accident) (HCC)   Bilateral carotid artery stenosis  Acute CVA Bilateral carotid artery stenosis left greater than right Embolic appearing however distribution of infarct is with in the left carotid artery territory.  Raises suspicion for carotid artery stenosis as primary driving factor.  This is not necessarily being considered an Eliquis  treatment failure. Echocardiogram reassuring.  Negative bubble Deterioration in neurologic status noted 7/13 Improvement noted 7/14 Plan: Continue apixaban 5 mg daily Continue aspirin 81 mg daily Continue high intensity statin PT OT ST as tolerated Neurology follow-up Vascular is consulted.  Will discuss vascular service 7/15 to discuss possibly moving up intervention if within family's goals of care. Carotid endarterectomy carries substantial risks.  I have relayed my concerns to patient and family  Chronic atrial fibrillation with RVR (HCC) PTA Eliquis and Lopressor   Chronic diastolic CHF (congestive heart failure) (HCC) Continue demedex and entresto Continue Lopressor   Anxiety and depression PTA Xanax and Celexa   Gout PTA allopurinol   Dyslipidemia High intensity statin  Functional decline Palliative care evaluation, patient remains full scope of care   DVT prophylaxis: Apixaban Code Status: Full Family Communication: Daughter at bedside 7/11, 7/12, 7/13, 7/14 Disposition Plan: Status is: Inpatient Remains inpatient appropriate because: Acute CVA     Level of care: Telemetry Medical  Consultants:  Neurology  Procedures:  None  Antimicrobials: None   Subjective: Seen and examined.  No acute overnight events  Objective: Vitals:   01/30/23 2112 01/31/23 0518 01/31/23 0741 01/31/23 0901  BP: (!) 155/64 139/61 (!) 148/60 134/79  Pulse: 80 80 73 75  Resp: 20 17 16    Temp: 99.1 F (37.3 C) 98.3 F (36.8 C) 99 F (37.2 C)   TempSrc: Oral Oral    SpO2: 98% 98% 96%   Weight:      Height:  Intake/Output Summary (Last 24 hours) at 01/31/2023 1434 Last data filed at 01/31/2023 1300 Gross per 24 hour  Intake 360 ml  Output 500 ml  Net -140 ml   Filed Weights   01/27/23 1819 01/28/23 0100  Weight: 79.4 kg 87.6 kg    Examination:  General exam: NAD.  Appears frail Respiratory system: Lungs clear.  Normal work of breathing.  Room  air Cardiovascular system: S1-S2, RRR, 2/6 systolic murmur Gastrointestinal system: Soft, NT/ND, normal bowel sounds Central nervous system: Alert.  Oriented x 2.  Right-sided weakness.  Right facial droop Extremities: RUE and RLE weakness Skin: No rashes, lesions or ulcers Psychiatry: Judgement and insight appear impaired. Mood & affect confused.     Data Reviewed: I have personally reviewed following labs and imaging studies  CBC: Recent Labs  Lab 01/27/23 1827 01/28/23 0400  WBC 8.3 7.7  HGB 8.8* 8.6*  HCT 28.1* 27.0*  MCV 101.1* 101.1*  PLT 311 295   Basic Metabolic Panel: Recent Labs  Lab 01/27/23 1827 01/28/23 0400  NA 134* 136  K 4.5 4.5  CL 100 104  CO2 25 27  GLUCOSE 123* 124*  BUN 15 14  CREATININE 0.99 1.03*  CALCIUM 8.8* 8.7*   GFR: Estimated Creatinine Clearance: 45.5 mL/min (A) (by C-G formula based on SCr of 1.03 mg/dL (H)). Liver Function Tests: Recent Labs  Lab 01/27/23 1827  AST 23  ALT 18  ALKPHOS 98  BILITOT 0.7  PROT 6.3*  ALBUMIN 2.7*   Recent Labs  Lab 01/27/23 1827  LIPASE 34   No results for input(s): "AMMONIA" in the last 168 hours. Coagulation Profile: No results for input(s): "INR", "PROTIME" in the last 168 hours. Cardiac Enzymes: No results for input(s): "CKTOTAL", "CKMB", "CKMBINDEX", "TROPONINI" in the last 168 hours. BNP (last 3 results) No results for input(s): "PROBNP" in the last 8760 hours. HbA1C: No results for input(s): "HGBA1C" in the last 72 hours. CBG: Recent Labs  Lab 01/29/23 1205 01/29/23 1607 01/30/23 0821 01/30/23 1241 01/30/23 1620  GLUCAP 168* 140* 117* 133* 143*   Lipid Profile: No results for input(s): "CHOL", "HDL", "LDLCALC", "TRIG", "CHOLHDL", "LDLDIRECT" in the last 72 hours.  Thyroid Function Tests: No results for input(s): "TSH", "T4TOTAL", "FREET4", "T3FREE", "THYROIDAB" in the last 72 hours. Anemia Panel: No results for input(s): "VITAMINB12", "FOLATE", "FERRITIN", "TIBC",  "IRON", "RETICCTPCT" in the last 72 hours. Sepsis Labs: No results for input(s): "PROCALCITON", "LATICACIDVEN" in the last 168 hours.  No results found for this or any previous visit (from the past 240 hour(s)).       Radiology Studies: MR BRAIN WO CONTRAST  Result Date: 01/29/2023 CLINICAL DATA:  Stroke, follow-up EXAM: MRI HEAD WITHOUT CONTRAST TECHNIQUE: Multiplanar, multiecho pulse sequences of the brain and surrounding structures were obtained without intravenous contrast. COMPARISON:  01/27/2023 MRI head, correlation is made with 01/29/2023 CT head FINDINGS: Brain: Again noted are scattered foci of restricted diffusion with ADC correlates in the left frontal, parietal, temporal, and occipital cortex and white matter and left caudate; several of these have increased in conspicuity since the prior exam in the vein consistent with acute infarcts. Likely new small acute focus of restricted diffusion with ADC correlate in the left corona radiata (series 9, image 27). No acute hemorrhage, mass, mass effect, or midline shift. No hydrocephalus or extra-axial collection. Confluent T2 hyperintense signal in the periventricular white matter and pons, likely the sequela of moderate to severe chronic small vessel ischemic disease. Dilated perivascular spaces in the bilateral  basal ganglia. Vascular: No seen on the prior CTA, poor visualization of the right V4 flow void. Otherwise normal arterial flow voids. Skull and upper cervical spine: Normal marrow signal. Sinuses/Orbits: Clear paranasal sinuses. No acute finding in the orbits. Status post bilateral lens replacements. Other: The mastoid air cells are well aerated. 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 Otherwise unchanged scattered acute to early subacute infarcts throughout the left cerebral hemisphere. No acute hemorrhage, mass effect, or midline shift. These results will be called to the ordering clinician or representative by  the Radiologist Assistant, and communication documented in the PACS or [employer]. Electronically Signed   By: Wiliam Ke M.D.   On: 01/29/2023 23:58   CT ANGIO HEAD NECK W WO CM (CODE STROKE)  Result Date: 01/29/2023 CLINICAL DATA:  Neuro deficit, stroke suspected. Abnormal MRI with left-sided infarcts. EXAM: CT ANGIOGRAPHY HEAD AND NECK WITH AND WITHOUT CONTRAST TECHNIQUE: Multidetector CT imaging of the head and neck was performed using the standard protocol during bolus administration of intravenous contrast. Multiplanar CT image reconstructions and MIPs were obtained to evaluate the vascular anatomy. Carotid stenosis measurements (when applicable) are obtained utilizing NASCET criteria, using the distal internal carotid diameter as the denominator. RADIATION DOSE REDUCTION: This exam was performed according to the departmental dose-optimization program which includes automated exposure control, adjustment of the mA and/or kV according to patient size and/or use of iterative reconstruction technique. CONTRAST:  75mL OMNIPAQUE IOHEXOL 350 MG/ML SOLN COMPARISON:  MR head without contrast 01/27/2023. CT head without contrast 01/28/2023 and CT head without contrast 01/29/2023. FINDINGS: CTA NECK FINDINGS Aortic arch: Atherosclerotic calcifications are again noted at the aortic arch and great vessel origins. No stenosis or aneurysm is present. Right carotid system: The right common carotid artery is within normal limits. Mural calcifications are stable. The high-grade stenosis of greater than 80% is present at the proximal right ICA. Previous study underestimated the degree of stenosis. No significant interval change is present. Left carotid system: The left common carotid artery demonstrates extensive mural calcification without proximal stenosis. A high-grade, near occlusive stenosis is present in the proximal left ICA. More distal left ICA is slightly smaller in caliber than the right. Vertebral  arteries: The vertebral arteries both originate from the subclavian arteries. The left vertebral artery is dominant. A 50% stenosis present at its origin. The right vertebral artery is hypoplastic. It is occluded in the V3 segment is proximal to the dura. No significant stenosis is present on the left. Skeleton: Multilevel degenerative changes are again noted within the cervical spine. No focal osseous lesions are present. Other neck: Soft tissues the neck are otherwise unremarkable. Salivary glands are within normal limits. Thyroid is normal. No significant adenopathy is present. No focal mucosal or submucosal lesions are present. Upper chest: Small bilateral pleural effusions are present. Mild dependent atelectasis is present. No nodule or mass lesion is present. No significant airspace disease is present otherwise. Review of the MIP images confirms the above findings CTA HEAD FINDINGS Anterior circulation: Atherosclerotic calcifications are again noted within the cavernous internal carotid arteries bilaterally. No significant stenosis or interval change is present. The A1 and M1 segments are normal bilaterally. The anterior communicating artery is patent. MCA bifurcations within normal limits bilaterally. The ACA and MCA branch vessels are within normal limits. No aneurysm is present. Posterior circulation: Left vertebral artery is scratched at atherosclerotic changes are present at the dural margin of the left vertebral artery without a significant stenosis. Left  PICA origin is visualized and normal. The right V4 segment fills to the PICA, likely in retrograde fashion. The basilar artery is normal. Both superior cerebellar arteries are patent. The left posterior cerebral artery originates from basilar tip. The right posterior cerebral artery is of fetal type with a small right P1 segment. A proximal right P2 segment stenosis is again noted. In mid left P2 segment stenosis is present. PCA branch vessels are  otherwise unremarkable and stable. Venous sinuses: The dural sinuses are patent. The straight sinus and deep cerebral veins are intact. Cortical veins are within normal limits. No significant vascular malformation is evident. Anatomic variants: Fetal type right posterior cerebral artery. Review of the MIP images confirms the above findings IMPRESSION: 1. 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 2. 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 3. Stable 50% stenosis at the origin of the dominant left vertebral artery. 4. 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 5. Stable proximal right P2 segment stenosis. 6. Stable mid left P2 segment stenosis. 7. Stable atherosclerotic changes within the cavernous internal carotid arteries bilaterally without significant stenosis. 8. No significant proximal stenosis, aneurysm, or branch vessel occlusion within the Circle of Willis. 9.  Aortic Atherosclerosis (ICD10-I70.0). These results were called by telephone at the time of interpretation on 01/29/2023 at 4:31 Pm to provider [facility] , who verbally acknowledged these results. The final report was delayed by technical issues. Electronically Signed   By: Marin Roberts M.D.   On: 01/29/2023 17:50   CT HEAD CODE STROKE WO CONTRAST  Result Date: 01/29/2023 CLINICAL DATA:  Code stroke.  Neuro deficit, acute, stroke suspected EXAM: CT HEAD WITHOUT CONTRAST TECHNIQUE: Contiguous axial images were obtained from the base of the skull through the vertex without intravenous contrast. RADIATION DOSE REDUCTION: This exam was performed according to the departmental dose-optimization program which includes automated exposure control, adjustment of the mA and/or kV according to patient size and/or use of iterative reconstruction technique. COMPARISON:  None Available. FINDINGS: Brain: No evidence of acute infarction,  hemorrhage, hydrocephalus, extra-axial collection or mass lesion/mass effect. Patchy white matter hypodensities, nonspecific but compatible with chronic microvascular ischemic disease. Vascular: No hyperdense vessel. Skull: No acute fracture. Sinuses/Orbits: Clear sinuses.  No acute orbital findings. Other: No mastoid effusions. ASPECTS [facility] Stroke Program Early CT Score) total score (0-10 with 10 being normal): 10. IMPRESSION: 1. No evidence of acute intracranial abnormality. 2. ASPECTS is 10. Code stroke imaging results were communicated on 01/29/2023 at 3:58 pm to provider Dr. Amada Jupiter via secure text paging. Electronically Signed   By: Feliberto Harts M.D.   On: 01/29/2023 15:58        Scheduled Meds:  allopurinol  300 mg Oral Daily   apixaban  5 mg Oral BID   aspirin EC  81 mg Oral Daily   atorvastatin  80 mg Oral Daily   [START ON 02/01/2023] citalopram  20 mg Oral Daily   feeding supplement  237 mL Oral BID BM   metoprolol tartrate  25 mg Oral BID   polyethylene glycol  17 g Oral Daily   potassium chloride  10 mEq Oral Daily   sacubitril-valsartan  1 tablet Oral BID   senna-docusate  1 tablet Oral BID   torsemide  20 mg Oral Daily   Continuous Infusions:     LOS: 3 days    Tresa Moore, MD [facility]   If  7PM-7AM, please contact night-coverage  01/31/2023, 2:34 PM

## 2023-01-31 NOTE — Progress Notes (Signed)
Daily Progress Note   Patient Name: Brooke Davis       Date: 01/31/2023 DOB: [birth year]  Age: 82 y.o. MRN#: [id number] Attending Physician: Tresa Moore, MD Primary Care Physician: [name], MD Admit Date: 01/27/2023  Reason for Consultation/Follow-up: Establishing goals of care  HPI/Brief Hospital Review:  82 y.o. female  with past medical history of T2DM, HTN, HLD, CAD s/p PCI and stent, diastolic dysfunction and anxiety admitted from home on 01/27/2023 with acute onset altered mental status, slurred speech, expressive aphasia and right arm weakness.    Noted elective total right hip arthroplasty 01/18/2023-reportedly recovering well at home after surgery   Palliative medicine was consulted for assisting with goals of care conversations   Neurological event 7/12-Code Stroke initiated, repeat imaging MRI brain 7/12 IMPRESSION: New small acute infarct in the left corona radiata. Otherwise unchanged scattered acute to early subacute infarcts throughout the left cerebral hemisphere. No acute hemorrhage, mass effect, or midline shift.  Subjective: Extensive chart review has been completed prior to meeting patient including labs, vital signs, imaging, progress notes, orders, and available advanced directive documents from current and previous encounters.    Visited with Brooke Davis at her bedside. Awake, alert and able to engage in conversations. Complaints of left hand cramping but feels this is related to holding iPAD in that hand, symptoms have relieved since relaxing hand. Daughters at bedside during time of visit.  Plan remains to proceed with carotid intervention. Emphasis placed on having ongoing and realistic conversations with providers moving forward in relation to  risks associated with proceeding with interventions.  Family and Brooke Davis remain hopeful of an ongoing and continued recovery, anticipating positive outcomes from CIR.  Family with complaints related to care-passed on to nursing staff.  Answered and addressed all questions and concerns. PMT to follow-up mid week to address needs, encouraged to call PMT phone if needed.  Care plan was discussed with primary team and nursing staff.  Thank you for allowing the Palliative Medicine Team to assist in the care of this patient.  Total time:  35 minutes  Time spent includes: Detailed review of medical records (labs, imaging, vital signs), medically appropriate exam (mental status, respiratory, cardiac, skin), discussed with treatment team, counseling and educating patient, family and staff, documenting clinical information, medication management and coordination of  care.  Leeanne Deed, DNP, AGNP-C Palliative Medicine   Please contact Palliative Medicine Team phone at [phone] for questions and concerns.

## 2023-01-31 NOTE — Progress Notes (Addendum)
Speech Language Pathology Treatment: Cognitive-Linquistic  Patient Details Name: Brooke Davis MRN: [id number] DOB: [birth year] Today's Date: 01/31/2023 Time: 1040-1110 SLP Time Calculation (min) (ACUTE ONLY): 30 min  Assessment / Plan / Recommendation Clinical Impression  INTERVAL HISTORY: Code stroke called on Friday 01/29/2023 at 1540. MRI revealed Again noted are scattered foci of restricted diffusion with ADC correlates in the left frontal, parietal, temporal, and occipital cortex and white matter and left caudate; several of these have increased in conspicuity since the prior exam in the vein consistent with acute infarcts. Likely new small acute focus of restricted diffusion with ADC correlate in the left corona radiata (series 9, image 27). Cardiac stenting planned.   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 Pt states that years ago she had an audiological exam and bought hearing aids. In the time sense, her left hearing aid broke so she began wearing her right hearing aid in her left ear. She has not been able to financially be reassessed. During this evaluation, pt's hearing interfered with her ability to hear instructions and respond within a timely manner to verbal cues. SLP provided education onf cognitive decline and hearing loss and well as decreased prognosis with language recovery. Encouraged pt to seek audiology appt at discharge and perhaps audiology office might be able to help with financial assistance for new aids.   Pt presents with > 95% speech intelligibility at the unfamiliar sentence level, is mild to moderate distracted by the moderately nosey environment of her daughter's talking. SLP facilitated higher level word finding by providing moderate verbal cues to name 4 items within basic category. At this time, recommend ST follow for treatment/re-evaluation following cardiac procedure.   Pt's daughters with concern regarding  bruising from assist required standing. Nursing made aware and in to check pt during this treatment.    HPI HPI: Per MD progress note, "82 y.o. female with medical history significant for type 2 diabetes mellitus, hypertension, dyslipidemia, coronary artery disease status post PCI and stent, diastolic dysfunction, and anxiety, who presented to the emergency room with acute onset of altered mental status with confusion after waking up from a nap at 3 PM.  She started her nap at her baseline around 1 PM.  She was having slurred speech and expressive aphasia as well as right arm weakness and neglect.  She just had a right hip surgery on 7/1... Symptoms had initially improved the emergency room however patient had a deterioration in neurologic function early a.m. on 7/11. Repeat head CT without significant interval change." MRI 01/27/23: "Evaluation is significantly limited by motion, with the exception of the diffusion-weighted sequence. Within this limitation, there are scattered foci of acute infarct in the left cerebral hemisphere and the left caudate." Head CT 01/28/23: "Stable noncontrast CT appearance of the brain since yesterday, the  multiple small infarcts by MRI are largely occult by CT. No new intracranial abnormality."      SLP Plan  Continue with current plan of care      Recommendations for follow up therapy are one component of a multi-disciplinary discharge planning process, led by the attending physician.  Recommendations may be updated based on patient status, additional functional criteria and insurance authorization.    Recommendations   CIR                  Oral care BID   Intermittent Supervision/Assistance Aphasia (R47.01);Cognitive communication deficit (R41.841);Apraxia (R48.2)     Continue with current  plan of care    Cameryn Chrisley B. Dreama Saa, M.S., CCC-SLP, Tree surgeon Certified Brain Injury Specialist [facility]  [facility] Rehabilitation Services Office [phone] Ascom [phone] Fax [phone]

## 2023-01-31 NOTE — Progress Notes (Signed)
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 She also recently had right THA on 01/18/23.    PT Comments  Pt in bed, awaiting breakfast.  Generally flat affect.  She is able to get to EOB with contact guard/supervision and overall good movement.  Steady in sitting.  Attempts to stand to RW with +1 assist but unable to clear her hip so +2 is called for safety.  While waiting, she often rests her head on walker handle. When asked she denies dizziness but stated she is generally fatigued.  BP 134/79 P 75 in sitting.  She is motivated to get OOB and is able to stand with min a +2 when arrives and take several small steps with walker to recliner at bedside.  Breakfast tray arrived and she is set up as she does state she is hungry and has been waiting for her tray.  Returned later in AM for further mobility but she is with nursing staff for needs so it is deferred at this time.  Encouraged nursing staff to have +2 at this time.    Assistance Recommended at Discharge Frequent or constant Supervision/Assistance  If plan is discharge home, recommend the following:  Can travel by private vehicle    A lot of help with walking and/or transfers;A lot of help with bathing/dressing/bathroom;Assistance with cooking/housework;Help with stairs or ramp for entrance   No  Equipment Recommendations  None recommended by PT    Recommendations for Other Services       Precautions / Restrictions Precautions Precautions: Posterior Hip Precaution Comments: Verbally reminded pt of posterior hip precautions Restrictions Weight Bearing Restrictions: Yes RLE Weight Bearing: Weight bearing as tolerated Other Position/Activity Restrictions: RLE WBAT     Mobility  Bed Mobility Overal bed mobility:  Needs Assistance       Supine to sit: Min guard, HOB elevated       Patient Response: Flat affect, Cooperative  Transfers Overall transfer level: Needs assistance Equipment used: Rolling walker (2 wheels) Transfers: Sit to/from Stand Sit to Stand: Min assist, +2 physical assistance                Ambulation/Gait Ambulation/Gait assistance: Min assist, +2 physical assistance Gait Distance (Feet): 2 Feet Assistive device: Rolling walker (2 wheels) Gait Pattern/deviations: Decreased stride length, Decreased step length - right, Decreased step length - left, Step-through pattern Gait velocity: decreased     General Gait Details: able to step to chair at bedside with slow cautions steps +2 for safety   Stairs             Wheelchair Mobility     Tilt Bed Tilt Bed Patient Response: Flat affect, Cooperative  Modified Rankin (Stroke Patients Only)       Balance Overall balance assessment: Needs assistance Sitting-balance support: Feet supported Sitting balance-Leahy Scale: Good Sitting balance - Comments: supervision static sitting EOB   Standing balance support: Bilateral upper extremity supported, Reliant on assistive device for balance, During functional activity Standing balance-Leahy Scale: Fair Standing balance comment: +2 for safety with walker                            Cognition Arousal/Alertness: Awake/alert Behavior During Therapy: Flat affect Overall Cognitive Status: History of cognitive impairments - at  baseline                         Following Commands: Follows one step commands consistently       General Comments: Takes prolonged amount of time to initiate task, but she is aware of this.        Exercises Other Exercises Other Exercises: extended time sitting EOB prior to transfer.  pt often resting on RW.    General Comments        Pertinent Vitals/Pain Pain Assessment Pain Assessment: No/denies  pain Pain Intervention(s): Monitored during session, Repositioned    Home Living                          Prior Function            PT Goals (current goals can now be found in the care plan section) Progress towards PT goals: Progressing toward goals    Frequency    Min 5X/week      PT Plan Current plan remains appropriate    Co-evaluation              AM-PAC PT "6 Clicks" Mobility   Outcome Measure  Help needed turning from your back to your side while in a flat bed without using bedrails?: None Help needed moving from lying on your back to sitting on the side of a flat bed without using bedrails?: None Help needed moving to and from a bed to a chair (including a wheelchair)?: A Little Help needed standing up from a chair using your arms (e.g., wheelchair or bedside chair)?: A Lot Help needed to walk in hospital room?: A Lot Help needed climbing 3-5 steps with a railing? : Total 6 Click Score: 16    End of Session Equipment Utilized During Treatment: Gait belt Activity Tolerance: Patient tolerated treatment well Patient left: with family/visitor present;in chair;with call bell/phone within reach;with chair alarm set (on bedside commode) Nurse Communication: Mobility status PT Visit Diagnosis: Unsteadiness on feet (R26.81);Other abnormalities of gait and mobility (R26.89);Muscle weakness (generalized) (M62.81);Difficulty in walking, not elsewhere classified (R26.2) Hemiplegia - Right/Left: Right Hemiplegia - dominant/non-dominant: Dominant Hemiplegia - caused by: Cerebral infarction Pain - Right/Left: Right Pain - part of body: Hip     Time: 0842-0901 PT Time Calculation (min) (ACUTE ONLY): 19 min  Charges:    $Therapeutic Activity: 8-22 mins PT General Charges $$ ACUTE PT VISIT: 1 Visit                   Danielle Dess, PTA 01/31/23, 10:42 AM

## 2023-02-01 DIAGNOSIS — I63412 Cerebral infarction due to embolism of left middle cerebral artery: Secondary | ICD-10-CM | POA: Diagnosis not present

## 2023-02-01 MED ORDER — FLUTICASONE PROPIONATE 50 MCG/ACT NA SUSP
1.0000 | Freq: Every day | NASAL | Status: DC
  Administered 2023-02-01 – 2023-02-02 (×2): 1 via NASAL
  Filled 2023-02-01: qty 16

## 2023-02-01 NOTE — Progress Notes (Signed)
Inpatient Rehab Admissions Coordinator:  Spoke with pt's daughter [name] on the phone when called pt's room. Informed her that beginning insurance authorization. Will continue to follow.   Wolfgang Phoenix, MS, CCC-SLP Admissions Coordinator [phone]

## 2023-02-01 NOTE — Progress Notes (Signed)
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 She also recently had right THA on 01/18/23.   OT comments  Pt received supine in bed. Appearing fatigued, but willing to work with OT on grooming and toileting. T/f MIN A. See flowsheet below for further details of session. Left semi-reclined in bed, family member in room, bed alarm on, with all needs in reach.  Patient will benefit from continued OT while in acute care.    Recommendations for follow up therapy are one component of a multi-disciplinary discharge planning process, led by the attending physician.  Recommendations may be updated based on patient status, additional functional criteria and insurance authorization.    Assistance Recommended at Discharge Frequent or constant Supervision/Assistance  Patient can return home with the following  A lot of help with walking and/or transfers;A lot of help with bathing/dressing/bathroom;Assistance with cooking/housework;Direct supervision/assist for medications management;Direct supervision/assist for financial management;Help with stairs or ramp for entrance   Equipment Recommendations  Other (comment) (defer to next venue of care)    Recommendations for Other Services      Precautions / Restrictions Precautions Precautions: Posterior Hip Precaution Comments: Verbally reminded pt of posterior hip precautions Restrictions Weight Bearing Restrictions: Yes RLE Weight Bearing: Weight bearing as tolerated Other Position/Activity Restrictions: RLE WBAT       Mobility Bed Mobility Overal bed mobility: Needs Assistance Bed Mobility: Supine to Sit     Supine to sit: Min guard, HOB elevated Sit to supine: Min assist   General bed mobility comments: min assist for LE's  back into bed for comfort    Transfers Overall transfer level: Needs assistance Equipment used: Rolling walker (2 wheels) Transfers: Sit to/from Stand Sit to Stand: Min assist (second person for safety)           General transfer comment: Pt not reaching back for surface before sitting; pt with BIL hands on RW during sit to stand. Will need increased training for safety in transfers and mobility     Balance Overall balance assessment: Needs assistance Sitting-balance support: Feet supported Sitting balance-Leahy Scale: Good     Standing balance support: Bilateral upper extremity supported, Reliant on assistive device for balance, During functional activity Standing balance-Leahy Scale: Fair Standing balance comment: +2 for safety with walker                           ADL either performed or assessed with clinical judgement   ADL Overall ADL's : Needs assistance/impaired     Grooming: Set up;Sitting;Wash/dry face;Oral care Grooming Details (indicate cue type and reason): pt seated on BSC next to sink and OT handing her items             Lower Body Dressing: Maximal assistance Lower Body Dressing Details (indicate cue type and reason): OT donned pt socks at bed level Toilet Transfer: Minimal assistance;Rolling walker (2 wheels) Toilet Transfer Details (indicate cue type and reason): BSC brought behind pt when she fatigued from mobility Toileting- Clothing Manipulation and Hygiene: Sitting/lateral lean;Min guard Toileting - Clothing Manipulation Details (indicate cue type and reason): seated on BSC for urine hygiene     Functional mobility during ADLs: Rolling walker (2 wheels);Minimal assistance;+2 for physical assistance General ADL Comments: Pt able to walk approx 8 feet from  bed to standing near sink, then needing to sit for rest break; sat on [facility] for grooming today.    Extremity/Trunk Assessment Upper Extremity Assessment Upper Extremity Assessment:  Overall WFL for tasks assessed;Generalized weakness RUE Deficits / Details: RUE deficits appear to be improving. BIL UE strength and ROM WNL today. Slight impairment of coordination on finger-nose in RUE today. Per family at bedside, pt is L-handed.   Lower Extremity Assessment Lower Extremity Assessment: Defer to PT evaluation RLE Deficits / Details: Pt appearing to be slightly buckling in RLE today during mobility        Vision       Perception     Praxis      Cognition Arousal/Alertness: Awake/alert Behavior During Therapy: WFL for tasks assessed/performed, Flat affect Overall Cognitive Status: History of cognitive impairments - at baseline                                 General Comments: Pt is very fatigued (family in room states she did not sleep last night), but able to state her needs ("give me just a moment"); quiet during session, although did state that NA did a good job yesterday.        Exercises      Shoulder Instructions       General Comments On room air. Pt fatigued, but able to tolerate session, despite lack of sleep.    Pertinent Vitals/ Pain       Pain Assessment Pain Assessment: No/denies pain  Home Living                                          Prior Functioning/Environment              Frequency  Min 1X/week        Progress Toward Goals  OT Goals(current goals can now be found in the care plan section)  Progress towards OT goals: Progressing toward goals  Acute Rehab OT Goals Patient Stated Goal: Get better OT Goal Formulation: With patient Time For Goal Achievement: 02/11/23 Potential to Achieve Goals: Good ADL Goals Pt Will Perform Grooming: with min assist;sitting Pt Will Perform Lower Body Dressing: with mod assist;with adaptive equipment Pt Will Transfer to Toilet: with min assist Pt Will Perform Toileting - Clothing Manipulation and hygiene: with min assist;sitting/lateral leans  Plan  Discharge plan remains appropriate;Frequency remains appropriate    Co-evaluation    PT/OT/SLP Co-Evaluation/Treatment: Yes Reason for Co-Treatment: To address functional/ADL transfers;For patient/therapist safety;Complexity of the patient's impairments (multi-system involvement) PT goals addressed during session: Mobility/safety with mobility;Proper use of DME;Balance OT goals addressed during session: ADL's and self-care;Proper use of Adaptive equipment and DME      AM-PAC OT "6 Clicks" Daily Activity     Outcome Measure   Help from another person eating meals?: None Help from another person taking care of personal grooming?: A Little Help from another person toileting, which includes using toliet, bedpan, or urinal?: A Little Help from another person bathing (including washing, rinsing, drying)?: A Lot Help from another person to put on and taking off regular upper body clothing?: A Little Help from another person to put on and taking off regular lower body clothing?: A Lot 6 Click Score: 17    End of Session Equipment Utilized During Treatment: Rolling walker (2  wheels)  OT Visit Diagnosis: Other abnormalities of gait and mobility (R26.89);Other symptoms and signs involving cognitive function;Other symptoms and signs involving the nervous system (R29.898);Hemiplegia and hemiparesis Hemiplegia - dominant/non-dominant: Non-Dominant Hemiplegia - caused by: Cerebral infarction   Activity Tolerance Patient tolerated treatment well   Patient Left in bed;with bed alarm set;with family/visitor present;with call bell/phone within reach   Nurse Communication Mobility status        Time: 8295-6213 OT Time Calculation (min): 23 min  Charges: OT General Charges $OT Visit: 1 Visit OT Treatments $Self Care/Home Management : 8-22 mins  Linward Foster, MS, OTR/L  Alvester Morin 02/01/2023, 10:13 AM

## 2023-02-01 NOTE — Progress Notes (Signed)
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 She also recently had right THA on 01/18/23.    PT Comments  Co-tx with OT for mobility.  She is fatigued from poor sleep last night but agrees to session this AM.  To EOB on R with rail and contact guard.  Stated it is easier for her on R than L side of bed.  Steady in sitting.  She is able to stand from bed with min a x 1 to RW.  She is able to walk about 4' x 2 today with RW and min a x 1 with recliner follow as she did fatigue out and need to sit.  She participated in ADL tasks with OT.  She is able to return to supine with light min assist for RLE onto bed.  Pt with good progression today needing less assist with transfers and able to take a few steps today.  Pt remains appropriate for 3 hours a day therapies at discharge.  Family in and remains supportive.    Assistance Recommended at Discharge Frequent or constant Supervision/Assistance  If plan is discharge home, recommend the following:  Can travel by private vehicle    A lot of help with walking and/or transfers;A lot of help with bathing/dressing/bathroom;Assistance with cooking/housework;Help with stairs or ramp for entrance   No  Equipment Recommendations  None recommended by PT    Recommendations for Other Services       Precautions / Restrictions Precautions Precautions: Posterior Hip Precaution Comments: Verbally reminded pt of posterior hip precautions Restrictions RLE Weight Bearing: Weight bearing as tolerated Other Position/Activity Restrictions: RLE WBAT     Mobility  Bed Mobility Overal bed mobility: Needs Assistance Bed Mobility: Supine to Sit     Supine to sit: Min guard, HOB elevated Sit to supine: Min assist   General bed mobility  comments: min assist for LE's back into bed for comfort Patient Response: Cooperative  Transfers Overall transfer level: Needs assistance Equipment used: Rolling walker (2 wheels) Transfers: Sit to/from Stand Sit to Stand: Min assist                Ambulation/Gait Ambulation/Gait assistance: Min assist, +2 safety/equipment Gait Distance (Feet): 4 Feet Assistive device: Rolling walker (2 wheels) Gait Pattern/deviations: Decreased stride length, Decreased step length - right, Decreased step length - left, Step-through pattern Gait velocity: decreased     General Gait Details: able to initiate walking towrds sink in room but unable to fully make distance   Stairs             Wheelchair Mobility     Tilt Bed Tilt Bed Patient Response: Cooperative  Modified Rankin (Stroke Patients Only)       Balance Overall balance assessment: Needs assistance Sitting-balance support: Feet supported Sitting balance-Leahy Scale: Good Sitting balance - Comments: supervision static sitting EOB   Standing balance support: Bilateral upper extremity supported, Reliant on assistive device for balance, During functional activity Standing balance-Leahy Scale: Fair Standing balance comment: +2 for safety with walker                            Cognition Arousal/Alertness: Awake/alert Behavior During Therapy: WFL for tasks assessed/performed, Flat affect Overall Cognitive Status: History of cognitive impairments - at  baseline                                 General Comments: generally fatigued from no sleep last night but perks up at end of session        Exercises      General Comments        Pertinent Vitals/Pain Pain Assessment Pain Assessment: No/denies pain Pain Location: minimal pain R hip with bed mobility Pain Descriptors / Indicators: Dull, Discomfort, Sore Pain Intervention(s): Limited activity within patient's tolerance, Monitored  during session, Repositioned    Home Living                          Prior Function            PT Goals (current goals can now be found in the care plan section) Progress towards PT goals: Progressing toward goals    Frequency    Min 5X/week      PT Plan Current plan remains appropriate    Co-evaluation PT/OT/SLP Co-Evaluation/Treatment: Yes Reason for Co-Treatment: Necessary to address cognition/behavior during functional activity;To address functional/ADL transfers;For patient/therapist safety;Complexity of the patient's impairments (multi-system involvement) PT goals addressed during session: Mobility/safety with mobility;Proper use of DME;Balance OT goals addressed during session: ADL's and self-care      AM-PAC PT "6 Clicks" Mobility   Outcome Measure  Help needed turning from your back to your side while in a flat bed without using bedrails?: None Help needed moving from lying on your back to sitting on the side of a flat bed without using bedrails?: None Help needed moving to and from a bed to a chair (including a wheelchair)?: A Little Help needed standing up from a chair using your arms (e.g., wheelchair or bedside chair)?: A Lot Help needed to walk in hospital room?: A Lot Help needed climbing 3-5 steps with a railing? : Total 6 Click Score: 16    End of Session Equipment Utilized During Treatment: Gait belt Activity Tolerance: Patient tolerated treatment well Patient left: with family/visitor present;in chair;with call bell/phone within reach;with chair alarm set (on bedside commode) Nurse Communication: Mobility status PT Visit Diagnosis: Unsteadiness on feet (R26.81);Other abnormalities of gait and mobility (R26.89);Muscle weakness (generalized) (M62.81);Difficulty in walking, not elsewhere classified (R26.2) Hemiplegia - Right/Left: Right Hemiplegia - dominant/non-dominant: Dominant Hemiplegia - caused by: Cerebral infarction Pain -  Right/Left: Right Pain - part of body: Hip     Time: 1610-9604 PT Time Calculation (min) (ACUTE ONLY): 23 min  Charges:    $Gait Training: 8-22 mins PT General Charges $$ ACUTE PT VISIT: 1 Visit                   Danielle Dess, PTA 02/01/23, 9:35 AM

## 2023-02-01 NOTE — TOC Progression Note (Signed)
Transition of Care [facility]) - Progression Note    Patient Details  Name: Brooke Davis MRN: [id number] Date of Birth: 29-Jul-1940  Transition of Care [facility]) CM/SW Contact  Allena Katz, LCSW Phone Number: 02/01/2023, 8:46 AM  Clinical Narrative:   CIR following patient and will start insurance auth when medically ready. CSW follow for care plan updates and needs.          Expected Discharge Plan and Services                                               Social Determinants of Health (SDOH) Interventions SDOH Screenings   Food Insecurity: No Food Insecurity (01/28/2023)  Housing: Low Risk  (01/28/2023)  Transportation Needs: No Transportation Needs (01/28/2023)  Utilities: Not At Risk (01/28/2023)  Financial Resource Strain: Patient Declined (11/05/2022)   Received from [facility], [facility]  Tobacco Use: Medium Risk (01/28/2023)    Readmission Risk Interventions     No data to display

## 2023-02-01 NOTE — Progress Notes (Signed)
Subjective: Patient is status post right total hip arthroplasty performed on 01/18/2023 by Dr. Ernest Pine.  She was admitted into the hospital after experiencing strokelike symptoms at home on 01/27/2023.  She was found to have a infarct in the left corona radiata.  Today she reports pain as mild along the right hip.  Reports that she is having some mild tenderness over the incision site.  States that she was having some difficulty working with physical therapy today, states that it was harder to get around and just felt fatigued.  Denies any fevers, chills, N/V, SOB, chest pain, weakness or difficulty with speech or sensation.. Patient seen in rounds with Dr. Ernest Pine. Urged patient to continue to work with therapy as she can tolerate. Patient is currently being evaluated for recurrent stroke symptoms and infarcts, found to have severe carotid stenosis present.  Will work with inpatient medicine and specialties for further evaluation of this before any planning on discharge.  Objective: Vital signs in last 24 hours: Temp:  [98.1 F (36.7 C)-99.8 F (37.7 C)] 98.1 F (36.7 C) (07/15 0838) Pulse Rate:  [66-76] 76 (07/15 0838) Resp:  [16-18] 16 (07/15 0838) BP: (106-133)/(61-85) 133/61 (07/15 0838) SpO2:  [96 %-100 %] 98 % (07/15 0838)  Intake/Output from previous day:  Intake/Output Summary (Last 24 hours) at 02/01/2023 1202 Last data filed at 02/01/2023 1051 Gross per 24 hour  Intake 480 ml  Output 1500 ml  Net -1020 ml    Intake/Output this shift: Total I/O In: 120 [P.O.:120] Out: -   Labs: No results for input(s): "HGB" in the last 72 hours. No results for input(s): "WBC", "RBC", "HCT", "PLT" in the last 72 hours. No results for input(s): "NA", "K", "CL", "CO2", "BUN", "CREATININE", "GLUCOSE", "CALCIUM" in the last 72 hours. No results for input(s): "LABPT", "INR" in the last 72 hours.  EXAM General - Patient is Alert, Appropriate, and Oriented, admits to having some difficulty with word  finding but otherwise able to have a clear coherent conversation with provider and daughter Extremity - Neurologically intact ABD soft Neurovascular intact Sensation intact distally Intact pulses distally Dorsiflexion/Plantar flexion intact Incision: no drainage and mild redness appreciated along the staples.  There is no large erythema or swelling appreciated. No cellulitis present Compartment soft  Dressing -see description above.  A new honeycomb dressing was applied over the patient's incision site.  Was found without any covering or dressing with provider at bedside. Motor Function - intact, moving foot and toes well on exam.  Patient able to plantar and dorsiflex feet bilaterally with good ROM and strength.  Patient able to perform SLR with both the right and left legs, noted to be slightly weaker on her right side.  Patient is neurovascularly intact down her legs bilaterally.  Posterior tibial pulses appreciated 2+  Mini neurological exam was performed, patient is able to smile and raise her eyebrows without any drooping noted, there is no slurring of speech.  Patient has good grip strength bilaterally with her hands without any deficit appreciated between her right or left extremity.  Patient is neurovascularly intact down her arms and legs bilaterally.  Able to straight leg raise and plantar and dorsiflex as depicted above.  Sensation intact.   Past Medical History:  Diagnosis Date   Anxiety    a.) on BZO (alprazolam) PRN   Arthritis    fingers, right hip   Atrial fibrillation (HCC) 11/10/2013   a.) CHA2DS2VASc = 5 (age x2, sex, HTN, T2DM);  b.) rate/rhythm  maintained on oral metoprolol tartrate; chronically anticoagulated with apixaban   Bronchitis    Cerebrovascular small vessel disease (chronic)    Coronary artery disease involving native coronary artery of native heart 01/11/2021   a.) LHC 01/11/2021: 40% oLM, 50% mLCx, 30% oLCx, 30% pLAD, 30% mLAD, 99% pRCA (2.75 x 30 mm  Resolute Onyx DES), 75% p-mRCA, 60% dRCA   DDD (degenerative disc disease), lumbar 12/01/2013   Diastolic dysfunction 01/12/2021   a.) TTE 01/12/2021 (s/p inf STEMI): EF 55-60%, mild-mod LAE, mild RAE, mod RVE, mild AR/MR, mod TR, G1DD   Diet-controlled type 2 diabetes mellitus (HCC)    Full dentures    Gout    History of bilateral cataract extraction 2021   Hyperlipidemia    Hypertension, essential    Long term current use of anticoagulant    a.) apixaban   PONV (postoperative nausea and vomiting)    Sinoatrial node dysfunction (HCC) 11/10/2013   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) 01/11/2021   a.) LHC/PCI 01/11/2021 --> culprit lesion 99% pRCA --> 2.75 x 30 mm Resolute Onyx DES x 1   Varicose veins of legs    Wears hearing aid in both ears     Assessment/Plan:    Principal Problem:   Embolic stroke (HCC) Active Problems:   Chronic atrial fibrillation with RVR (HCC)   Dyslipidemia   Chronic diastolic CHF (congestive heart failure) (HCC)   Gout   Anxiety and depression   Right arm weakness   Acute CVA (cerebrovascular accident) (HCC)   Bilateral carotid artery stenosis  Estimated body mass index is 35.32 kg/m as calculated from the following:   Height as of this encounter: 5\' 2"  (1.575 m).   Weight as of this encounter: 87.6 kg.  A new honeycomb dressing was applied over the patient's incision, there was no drainage appreciated at the incision site.  Discussed with daughter who states that it has gotten dramatically better over the past few days  Continue to work with PT and OT as tolerated -working on strength, ROM and ambulation.  Patient is WBAT to her right side  Continue patient on blood thinning regimen to help prevent further embolic events.  Will look to remove staples in coming days, as patient is 2 weeks postop from her right total hip arthroplasty.  May leave in for a few more days past 2-week benchmark given patient's recent  history of drainage  Patient will remain in the hospital for further evaluation of her carotid stenosis, will continue to work with inpatient and specialized medicine (vascular) for continued workup and evaluation.  Rayburn Go, PA-C [facility] 02/01/2023, 12:02 PM

## 2023-02-01 NOTE — Progress Notes (Signed)
PROGRESS NOTE    Brooke Davis  MWU:[id number] DOB: 08/10/1940 DOA: 01/27/2023 PCP: [name], MD    Brief Narrative:  82 y.o. female with medical history significant for type 2 diabetes mellitus, hypertension, dyslipidemia, coronary artery disease status post PCI and stent, diastolic dysfunction, and anxiety, who presented to the emergency room with acute onset of altered mental status with confusion after waking up from a nap at 3 PM.  She started her nap at her baseline around 1 PM.  She was having slurred speech and expressive aphasia as well as right arm weakness and neglect.  She just had a right hip surgery on 7/1.  She denies any paresthesias.  No weakness seizures.  No tinnitus or vertigo.  She has been having constipation and low back pain.  She denies any fever or chills.  No chest pain or palpitations.  No cough or wheezing.  No dysuria, oliguria or hematuria or flank pain.   Symptoms had initially improved the emergency room however patient had a deterioration in neurologic function early a.m. on 7/11.  Repeat head CT without significant interval change.  Case discussed with neurology.  Patient acute worsening of neurologic status on 7/12.  Taken emergently for CAT scan.  No hemorrhage.  Follow-up MRI in 7/13 demonstrates possible new areas of infarct.   Assessment & Plan:   Principal Problem:   Embolic stroke [facility]) Active Problems:   Chronic atrial fibrillation with RVR (HCC)   Chronic diastolic CHF (congestive heart failure) (HCC)   Dyslipidemia   Gout   Anxiety and depression   Right arm weakness   Acute CVA (cerebrovascular accident) (HCC)   Bilateral carotid artery stenosis  Acute CVA Bilateral carotid artery stenosis left greater than right Embolic appearing however distribution of infarct is with in the left carotid artery territory.  Raises suspicion for carotid artery stenosis as primary driving factor.  This is not necessarily being considered an Eliquis  treatment failure. Echocardiogram reassuring.  Negative bubble Deterioration in neurologic status noted 7/13 Improvement noted 7/14 Plan: Continue apixaban 5 mg daily Continue aspirin 81 mg daily Continue high intensity statin PT OT ST as tolerated Neurology follow-up I discussed case with vascular surgery on 7/15.  I also relayed some of my concerns and recommendations to the family.  At this time the patient is moderate to severe risk for perioperative complications with carotid endarterectomy.  Current plan of care would be to discharge patient to CIR once we have insurance authorization.  Initiate rehabilitation/recovery process and have the patient follow-up with vascular surgery in the clinic to discuss endovascular options for carotid stenosis.  Chronic atrial fibrillation with RVR (HCC) PTA Eliquis and Lopressor   Chronic diastolic CHF (congestive heart failure) (HCC) Continue demedex and entresto Continue Lopressor   Anxiety and depression PTA Xanax and Celexa   Gout PTA allopurinol   Dyslipidemia High intensity statin  Functional decline Palliative care evaluation, patient remains full scope of care   DVT prophylaxis: Apixaban Code Status: Full Family Communication: Daughter at bedside 7/11, 7/12, 7/13, 7/14, 7/15 Disposition Plan: Status is: Inpatient Remains inpatient appropriate because: Acute CVA     Level of care: Telemetry Medical  Consultants:  Neurology  Procedures:  None  Antimicrobials: None   Subjective: Seen and examined.  No acute overnight events.  Family remains at bedside  Objective: Vitals:   01/31/23 1552 01/31/23 2224 02/01/23 0405 02/01/23 0838  BP: 106/64 111/85 117/62 133/61  Pulse: 66 72 72 76  Resp: 18  16  16  Temp: 99.8 F (37.7 C) 98.8 F (37.1 C) 99.4 F (37.4 C) 98.1 F (36.7 C)  TempSrc:  Oral Oral   SpO2: 99% 96% 100% 98%  Weight:      Height:        Intake/Output Summary (Last 24 hours) at 02/01/2023  1107 Last data filed at 02/01/2023 1051 Gross per 24 hour  Intake 480 ml  Output 1500 ml  Net -1020 ml   Filed Weights   01/27/23 1819 01/28/23 0100  Weight: 79.4 kg 87.6 kg    Examination:  General exam: NAD Respiratory system: Respiratory effort normal.  Clear lungs.  Room air Cardiovascular system: S1-S2, RRR, 2/6 systolic murmur Gastrointestinal system: Soft, NT/ND, normal bowel sounds Central nervous system: Alert.  Oriented x 2.  Right-sided weakness.  Right facial droop Extremities: RUE and RLE weakness Skin: No rashes, lesions or ulcers Psychiatry: Judgement and insight appear impaired. Mood & affect confused.     Data Reviewed: I have personally reviewed following labs and imaging studies  CBC: Recent Labs  Lab 01/27/23 1827 01/28/23 0400  WBC 8.3 7.7  HGB 8.8* 8.6*  HCT 28.1* 27.0*  MCV 101.1* 101.1*  PLT 311 295   Basic Metabolic Panel: Recent Labs  Lab 01/27/23 1827 01/28/23 0400  NA 134* 136  K 4.5 4.5  CL 100 104  CO2 25 27  GLUCOSE 123* 124*  BUN 15 14  CREATININE 0.99 1.03*  CALCIUM 8.8* 8.7*   GFR: Estimated Creatinine Clearance: 45.5 mL/min (A) (by C-G formula based on SCr of 1.03 mg/dL (H)). Liver Function Tests: Recent Labs  Lab 01/27/23 1827  AST 23  ALT 18  ALKPHOS 98  BILITOT 0.7  PROT 6.3*  ALBUMIN 2.7*   Recent Labs  Lab 01/27/23 1827  LIPASE 34   No results for input(s): "AMMONIA" in the last 168 hours. Coagulation Profile: No results for input(s): "INR", "PROTIME" in the last 168 hours. Cardiac Enzymes: No results for input(s): "CKTOTAL", "CKMB", "CKMBINDEX", "TROPONINI" in the last 168 hours. BNP (last 3 results) No results for input(s): "PROBNP" in the last 8760 hours. HbA1C: No results for input(s): "HGBA1C" in the last 72 hours. CBG: Recent Labs  Lab 01/29/23 1205 01/29/23 1607 01/30/23 0821 01/30/23 1241 01/30/23 1620  GLUCAP 168* 140* 117* 133* 143*   Lipid Profile: No results for input(s):  "CHOL", "HDL", "LDLCALC", "TRIG", "CHOLHDL", "LDLDIRECT" in the last 72 hours.  Thyroid Function Tests: No results for input(s): "TSH", "T4TOTAL", "FREET4", "T3FREE", "THYROIDAB" in the last 72 hours. Anemia Panel: No results for input(s): "VITAMINB12", "FOLATE", "FERRITIN", "TIBC", "IRON", "RETICCTPCT" in the last 72 hours. Sepsis Labs: No results for input(s): "PROCALCITON", "LATICACIDVEN" in the last 168 hours.  No results found for this or any previous visit (from the past 240 hour(s)).       Radiology Studies: No results found.      Scheduled Meds:  allopurinol  300 mg Oral Daily   apixaban  5 mg Oral BID   aspirin EC  81 mg Oral Daily   atorvastatin  80 mg Oral Daily   citalopram  20 mg Oral Daily   feeding supplement  237 mL Oral BID BM   metoprolol tartrate  25 mg Oral BID   potassium chloride  10 mEq Oral Daily   sacubitril-valsartan  1 tablet Oral BID   senna-docusate  1 tablet Oral QHS   torsemide  20 mg Oral Daily   Continuous Infusions:     LOS:  4 days    Tresa Moore, MD [facility]   If 7PM-7AM, please contact night-coverage  02/01/2023, 11:07 AM

## 2023-02-01 NOTE — Progress Notes (Signed)
Speech Language Pathology Treatment: Cognitive-Linquistic  Patient Details Name: Brooke Davis MRN: [id number] DOB: 05/18/1941 Today's Date: 02/01/2023 Time: 1430-1530 SLP Time Calculation (min) (ACUTE ONLY): 60 min  Assessment / Plan / Recommendation Clinical Impression  Pt seen today for ongoing assessment of language skills; cognitive-linguistic skills in setting of acute infarct(s) in the left corona radiata and the left cerebral hemisphere; also a recent R THA on 01/18/2023 -- now, overall lengthy illness and deconditioning. Pt is HOH at Baseline only having 1 HA to wear for hearing. When utilizing supportive strategies, pt was able to hear this therapist one-on-one in the room at bedside.   Pt continues to present w/ expressive language deficits notable for hesitations, delayed response, and intermittent word finding deficits -- pt often stated "wait a minute". No overt semantic nor phonemic paraphasias noted; verbal expression primarily limited to short phrase-sentence level, suspect impacted by word finding and verbal fluency. No overt apraxia noted this session. Pt exhibited paucity of speech and decreased mental flexibility during language tasks restricting her accuracy and detail of answers but could increase verbal output/detail w/ verbal cue and Time given. Picture description tasks revealed limited verbal description of details w/in the picture but w/ verbal cues, pt was able to increase detailed description; this was also noted w/ object function and description tasks and problem id/solving of situations. Pt's category fluency was limited to ~5-6 items but w/ verbal cue, further items were recalled. Convergent/divergent tasks similar. Pt appeared limited to more concrete items and description during expressive speech tasks decreasing her fluency. During receptive language tasks, pt's accuracy in following instructions at 1-2 steps was 100% during basic tasks in front of her. Pt was able to  id object/object function and answer mod complex Y/N questions appropriately. Pt was able to independently id/verbalize several personal items she would like to have at Inpatient Rehab when discussing what a person would need if staying outside of the home, ie. at a hotel, out of town, overnight at [employer] (book, pillow, Depends, pjs, shoes - unsure about the type of clothes she would need). Of Note, pt exhibited deficits in memory recall and mental flexibility w/ tasks of abstraction and calculation; again, accuracy increased w/ verbal cue and/or strategy use. Pt's overall Attention during the session today was appropriate.   Education given on supportive strategies and identifying need to implement such, especially when not hearing a verbal task/command completely (d/t HOH). To support her Hearing, educated on and encouraged pt to use: asking for repetition, reducing noise/distraction in immediate area, reducing distance b/t speaker/listener, speaker to look at the pt when talking. Education given on cueing strategies during tasks to include asking to write it down, asking for a cue/description, giving Time.   Pt presents with expressive Aphasia w/ overall grossly functional communication abilities in general, known tasks; increased impairment noted in unfamiliar verbal language challenges, descriptions, and tasks. Also noted are deficits in area of mental flexibility w/ higher cognitive-linguistic tasks -- more assessment is indicated. Any deficits could impact ability to independently function in communication ADLs and maintain safety in the home environment. Recommend pt have f/u at her next venue of care to continue to address language challenges for safe return to home environment and reduce caregiver burden w/in the home. Daughter stated they are seeking Inpatient Rehab prior to returning home, then hope to have home health f/u in the home. Encouraged f/u w/ TOC for further questions.          HPI HPI:  Per MD progress note, "82 y.o. female with medical history significant for type 2 diabetes mellitus, HOH w/ HA x1, hypertension, dyslipidemia, coronary artery disease status post PCI and stent, diastolic dysfunction, and anxiety, who presented to the emergency room with acute onset of altered mental status with confusion after waking up from a nap at 3 PM.  She started her nap at her baseline around 1 PM.  She was having slurred speech and expressive aphasia as well as right arm weakness and neglect.  She just had a right hip surgery on 7/1... Symptoms had initially improved the emergency room however patient had a deterioration in neurologic function early a.m. on 7/11. Repeat head CT without significant interval change." MRI 01/27/23: "Evaluation is significantly limited by motion, with the exception of the diffusion-weighted sequence. Within this limitation, there are scattered foci of acute infarct in the left cerebral hemisphere and the left caudate." Head CT 01/28/23: "Stable noncontrast CT appearance of the brain since yesterday, the  multiple small infarcts by MRI are largely occult by CT. No new intracranial abnormality.".  Pt had new neurological change since admit on 01/29/23 - see Imaging and MD notes.      SLP Plan  Continue with current plan of care      Recommendations for follow up therapy are one component of a multi-disciplinary discharge planning process, led by the attending physician.  Recommendations may be updated based on patient status, additional functional criteria and insurance authorization.    Recommendations   F/u at next venue of care -- family/pt seeking Inpatient Rehab                 (f/u Rehab at next venue of care) Oral care BID;Patient independent with oral care (setup support w/ Denture care)   Intermittent Supervision/Assistance Aphasia (R47.01);Cognitive communication deficit (R41.841)     Continue with current plan of care         Jerilynn Som, MS, CCC-SLP Speech Language Pathologist Rehab Services; [facility] [phone] (ascom) Mckynna Vanloan  02/01/2023, 3:40 PM

## 2023-02-01 NOTE — Care Management Important Message (Signed)
Important Message  Patient Details  Name: Brooke Davis MRN: [id number] Date of Birth: [birth year]   Medicare Important Message Given:  N/A - LOS <3 / Initial given by admissions     Brooke Davis 02/01/2023, 8:42 AM

## 2023-02-01 NOTE — Progress Notes (Signed)
   02/01/23 0900  Spiritual Encounters  Type of Visit Follow up  Care provided to: Pt and family  Referral source Patient request  Reason for visit Advance directives  OnCall Visit No   Chaplain presented with compassionate presence to complete requested advanced directive.  After meeting with patient and family the advanced directive was declined. Chaplain spiritual support services remain available as the need arises.

## 2023-02-02 ENCOUNTER — Inpatient Hospital Stay (HOSPITAL_COMMUNITY): Payer: Medicare Other

## 2023-02-02 ENCOUNTER — Inpatient Hospital Stay (HOSPITAL_COMMUNITY)
Admission: RE | Admit: 2023-02-02 | Discharge: 2023-02-10 | DRG: 057 | Disposition: A | Discharge status: Home Health | Payer: Medicare Other | Source: Other Acute Inpatient Hospital | Attending: Physical Medicine & Rehabilitation | Admitting: Physical Medicine & Rehabilitation

## 2023-02-02 ENCOUNTER — Other Ambulatory Visit: Payer: Self-pay

## 2023-02-02 ENCOUNTER — Encounter (HOSPITAL_COMMUNITY): Payer: Self-pay | Admitting: Physical Medicine & Rehabilitation

## 2023-02-02 DIAGNOSIS — Z96651 Presence of right artificial knee joint: Secondary | ICD-10-CM | POA: Diagnosis present

## 2023-02-02 DIAGNOSIS — I252 Old myocardial infarction: Secondary | ICD-10-CM

## 2023-02-02 DIAGNOSIS — Z823 Family history of stroke: Secondary | ICD-10-CM

## 2023-02-02 DIAGNOSIS — D62 Acute posthemorrhagic anemia: Secondary | ICD-10-CM | POA: Diagnosis present

## 2023-02-02 DIAGNOSIS — Z9841 Cataract extraction status, right eye: Secondary | ICD-10-CM

## 2023-02-02 DIAGNOSIS — I6340 Cerebral infarction due to embolism of unspecified cerebral artery: Secondary | ICD-10-CM

## 2023-02-02 DIAGNOSIS — I63412 Cerebral infarction due to embolism of left middle cerebral artery: Secondary | ICD-10-CM | POA: Diagnosis not present

## 2023-02-02 DIAGNOSIS — Z87891 Personal history of nicotine dependence: Secondary | ICD-10-CM

## 2023-02-02 DIAGNOSIS — E785 Hyperlipidemia, unspecified: Secondary | ICD-10-CM | POA: Diagnosis present

## 2023-02-02 DIAGNOSIS — Z833 Family history of diabetes mellitus: Secondary | ICD-10-CM

## 2023-02-02 DIAGNOSIS — M549 Dorsalgia, unspecified: Secondary | ICD-10-CM | POA: Diagnosis not present

## 2023-02-02 DIAGNOSIS — Z79899 Other long term (current) drug therapy: Secondary | ICD-10-CM

## 2023-02-02 DIAGNOSIS — Z7901 Long term (current) use of anticoagulants: Secondary | ICD-10-CM | POA: Diagnosis not present

## 2023-02-02 DIAGNOSIS — Z96641 Presence of right artificial hip joint: Secondary | ICD-10-CM | POA: Diagnosis not present

## 2023-02-02 DIAGNOSIS — M25551 Pain in right hip: Secondary | ICD-10-CM | POA: Diagnosis not present

## 2023-02-02 DIAGNOSIS — R197 Diarrhea, unspecified: Secondary | ICD-10-CM | POA: Diagnosis not present

## 2023-02-02 DIAGNOSIS — I69351 Hemiplegia and hemiparesis following cerebral infarction affecting right dominant side: Secondary | ICD-10-CM

## 2023-02-02 DIAGNOSIS — I63111 Cerebral infarction due to embolism of right vertebral artery: Secondary | ICD-10-CM

## 2023-02-02 DIAGNOSIS — Z7982 Long term (current) use of aspirin: Secondary | ICD-10-CM | POA: Diagnosis not present

## 2023-02-02 DIAGNOSIS — I959 Hypotension, unspecified: Secondary | ICD-10-CM | POA: Diagnosis not present

## 2023-02-02 DIAGNOSIS — Z4802 Encounter for removal of sutures: Secondary | ICD-10-CM

## 2023-02-02 DIAGNOSIS — E119 Type 2 diabetes mellitus without complications: Secondary | ICD-10-CM | POA: Diagnosis present

## 2023-02-02 DIAGNOSIS — I5032 Chronic diastolic (congestive) heart failure: Secondary | ICD-10-CM | POA: Diagnosis present

## 2023-02-02 DIAGNOSIS — I6310 Cerebral infarction due to embolism of unspecified precerebral artery: Secondary | ICD-10-CM

## 2023-02-02 DIAGNOSIS — M109 Gout, unspecified: Secondary | ICD-10-CM | POA: Diagnosis present

## 2023-02-02 DIAGNOSIS — I639 Cerebral infarction, unspecified: Secondary | ICD-10-CM | POA: Diagnosis not present

## 2023-02-02 DIAGNOSIS — I6930 Unspecified sequelae of cerebral infarction: Secondary | ICD-10-CM | POA: Diagnosis not present

## 2023-02-02 DIAGNOSIS — I2510 Atherosclerotic heart disease of native coronary artery without angina pectoris: Secondary | ICD-10-CM | POA: Diagnosis present

## 2023-02-02 DIAGNOSIS — Z8249 Family history of ischemic heart disease and other diseases of the circulatory system: Secondary | ICD-10-CM

## 2023-02-02 DIAGNOSIS — I69320 Aphasia following cerebral infarction: Secondary | ICD-10-CM | POA: Diagnosis not present

## 2023-02-02 DIAGNOSIS — I4891 Unspecified atrial fibrillation: Secondary | ICD-10-CM | POA: Diagnosis present

## 2023-02-02 DIAGNOSIS — I6523 Occlusion and stenosis of bilateral carotid arteries: Secondary | ICD-10-CM | POA: Diagnosis present

## 2023-02-02 DIAGNOSIS — I110 Hypertensive heart disease with heart failure: Secondary | ICD-10-CM | POA: Diagnosis present

## 2023-02-02 DIAGNOSIS — Z974 Presence of external hearing-aid: Secondary | ICD-10-CM | POA: Diagnosis not present

## 2023-02-02 DIAGNOSIS — I69312 Visuospatial deficit and spatial neglect following cerebral infarction: Principal | ICD-10-CM

## 2023-02-02 DIAGNOSIS — Z955 Presence of coronary angioplasty implant and graft: Secondary | ICD-10-CM

## 2023-02-02 DIAGNOSIS — Z9842 Cataract extraction status, left eye: Secondary | ICD-10-CM

## 2023-02-02 DIAGNOSIS — Z90711 Acquired absence of uterus with remaining cervical stump: Secondary | ICD-10-CM | POA: Diagnosis not present

## 2023-02-02 LAB — URINALYSIS, W/ REFLEX TO CULTURE (INFECTION SUSPECTED)
Bilirubin Urine: NEGATIVE
Glucose, UA: NEGATIVE mg/dL
Hgb urine dipstick: NEGATIVE
Ketones, ur: NEGATIVE mg/dL
Nitrite: NEGATIVE
Protein, ur: NEGATIVE mg/dL
Specific Gravity, Urine: 1.005 (ref 1.005–1.030)
pH: 6 (ref 5.0–8.0)

## 2023-02-02 MED ORDER — ASPIRIN 81 MG PO TBEC: 81.0000 mg | DELAYED_RELEASE_TABLET | Freq: Every day | ORAL | Status: DC

## 2023-02-02 MED ORDER — ENSURE ENLIVE PO LIQD: 237.0000 mL | Freq: Two times a day (BID) | ORAL | Status: DC

## 2023-02-02 MED ORDER — ZINC OXIDE 40 % EX OINT: TOPICAL_OINTMENT | Freq: Three times a day (TID) | CUTANEOUS | Status: DC | PRN

## 2023-02-02 MED ORDER — MELATONIN 5 MG PO TABS: 5.0000 mg | ORAL_TABLET | Freq: Every day | ORAL | Status: DC

## 2023-02-02 MED ORDER — POLYETHYLENE GLYCOL 3350 17 G PO PACK: 17.0000 g | PACK | Freq: Every day | ORAL | Status: DC | PRN

## 2023-02-02 MED ORDER — ACETAMINOPHEN 325 MG PO TABS
650.0000 mg | ORAL_TABLET | Freq: Four times a day (QID) | ORAL | Status: DC | PRN
  Administered 2023-02-02 – 2023-02-09 (×4): 650 mg via ORAL
  Filled 2023-02-02 (×4): qty 2

## 2023-02-02 MED ORDER — POTASSIUM CHLORIDE CRYS ER 10 MEQ PO TBCR
10.0000 meq | EXTENDED_RELEASE_TABLET | Freq: Every day | ORAL | Status: DC
  Administered 2023-02-03 – 2023-02-10 (×8): 10 meq via ORAL
  Filled 2023-02-02 (×8): qty 1

## 2023-02-02 MED ORDER — TORSEMIDE 20 MG PO TABS
20.0000 mg | ORAL_TABLET | Freq: Every day | ORAL | Status: DC
  Administered 2023-02-03 – 2023-02-10 (×8): 20 mg via ORAL
  Filled 2023-02-02 (×8): qty 1

## 2023-02-02 MED ORDER — SENNOSIDES-DOCUSATE SODIUM 8.6-50 MG PO TABS: 1.0000 | ORAL_TABLET | Freq: Every day | ORAL | Status: DC

## 2023-02-02 MED ORDER — CLONAZEPAM 0.5 MG PO TABS: 0.5000 mg | ORAL_TABLET | Freq: Three times a day (TID) | ORAL | Status: DC | PRN

## 2023-02-02 MED ORDER — OXYCODONE HCL 5 MG PO TABS
5.0000 mg | ORAL_TABLET | ORAL | Status: DC | PRN
  Administered 2023-02-02 – 2023-02-09 (×13): 5 mg via ORAL
  Filled 2023-02-02 (×13): qty 1

## 2023-02-02 MED ORDER — SACUBITRIL-VALSARTAN 24-26 MG PO TABS
1.0000 | ORAL_TABLET | Freq: Two times a day (BID) | ORAL | Status: DC
  Administered 2023-02-02 – 2023-02-10 (×16): 1 via ORAL
  Filled 2023-02-02 (×16): qty 1

## 2023-02-02 MED ORDER — APIXABAN 5 MG PO TABS
5.0000 mg | ORAL_TABLET | Freq: Two times a day (BID) | ORAL | Status: DC
  Administered 2023-02-02 – 2023-02-10 (×16): 5 mg via ORAL
  Filled 2023-02-02 (×16): qty 1

## 2023-02-02 MED ORDER — ACETAMINOPHEN 650 MG RE SUPP: 650.0000 mg | Freq: Four times a day (QID) | RECTAL | Status: DC | PRN

## 2023-02-02 MED ORDER — SENNOSIDES-DOCUSATE SODIUM 8.6-50 MG PO TABS
1.0000 | ORAL_TABLET | Freq: Every day | ORAL | Status: DC
  Administered 2023-02-02 – 2023-02-07 (×6): 1 via ORAL
  Filled 2023-02-02 (×6): qty 1

## 2023-02-02 MED ORDER — TRAZODONE HCL 50 MG PO TABS
25.0000 mg | ORAL_TABLET | Freq: Every evening | ORAL | Status: DC | PRN
  Administered 2023-02-04 – 2023-02-09 (×4): 25 mg via ORAL
  Filled 2023-02-02 (×4): qty 1

## 2023-02-02 MED ORDER — BISACODYL 10 MG RE SUPP: 10.0000 mg | Freq: Every day | RECTAL | Status: DC | PRN

## 2023-02-02 MED ORDER — ALLOPURINOL 300 MG PO TABS
300.0000 mg | ORAL_TABLET | Freq: Every day | ORAL | Status: DC
  Administered 2023-02-03 – 2023-02-10 (×8): 300 mg via ORAL
  Filled 2023-02-02 (×8): qty 1

## 2023-02-02 MED ORDER — ASPIRIN 81 MG PO TBEC
81.0000 mg | DELAYED_RELEASE_TABLET | Freq: Every day | ORAL | Status: DC
  Administered 2023-02-03 – 2023-02-10 (×8): 81 mg via ORAL
  Filled 2023-02-02 (×8): qty 1

## 2023-02-02 MED ORDER — FLUTICASONE PROPIONATE 50 MCG/ACT NA SUSP
1.0000 | Freq: Every day | NASAL | Status: DC
  Administered 2023-02-03 – 2023-02-10 (×8): 1 via NASAL
  Filled 2023-02-02: qty 16

## 2023-02-02 MED ORDER — CITALOPRAM HYDROBROMIDE 20 MG PO TABS: 20.0000 mg | ORAL_TABLET | Freq: Every day | ORAL | Status: DC

## 2023-02-02 MED ORDER — FLUTICASONE PROPIONATE 50 MCG/ACT NA SUSP: 1.0000 | Freq: Every day | NASAL | Status: DC

## 2023-02-02 MED ORDER — CITALOPRAM HYDROBROMIDE 20 MG PO TABS
20.0000 mg | ORAL_TABLET | Freq: Every day | ORAL | Status: DC
  Administered 2023-02-03 – 2023-02-10 (×8): 20 mg via ORAL
  Filled 2023-02-02 (×8): qty 1

## 2023-02-02 MED ORDER — METOPROLOL TARTRATE 25 MG PO TABS
25.0000 mg | ORAL_TABLET | Freq: Two times a day (BID) | ORAL | Status: DC
  Administered 2023-02-02 – 2023-02-10 (×16): 25 mg via ORAL
  Filled 2023-02-02 (×16): qty 1

## 2023-02-02 MED ORDER — ONDANSETRON HCL 4 MG/2ML IJ SOLN: 4.0000 mg | Freq: Four times a day (QID) | INTRAMUSCULAR | Status: DC | PRN

## 2023-02-02 MED ORDER — ONDANSETRON HCL 4 MG PO TABS: 4.0000 mg | ORAL_TABLET | Freq: Four times a day (QID) | ORAL | Status: DC | PRN

## 2023-02-02 MED ORDER — ENSURE ENLIVE PO LIQD
237.0000 mL | Freq: Two times a day (BID) | ORAL | Status: DC
  Administered 2023-02-03 – 2023-02-08 (×9): 237 mL via ORAL

## 2023-02-02 MED ORDER — ATORVASTATIN CALCIUM 80 MG PO TABS
80.0000 mg | ORAL_TABLET | Freq: Every day | ORAL | Status: DC
  Administered 2023-02-03 – 2023-02-10 (×8): 80 mg via ORAL
  Filled 2023-02-02 (×8): qty 1

## 2023-02-02 MED ORDER — MELATONIN 5 MG PO TABS
5.0000 mg | ORAL_TABLET | Freq: Every day | ORAL | Status: DC
  Administered 2023-02-02 – 2023-02-09 (×8): 5 mg via ORAL
  Filled 2023-02-02 (×8): qty 1

## 2023-02-02 MED ORDER — CLONAZEPAM 0.5 MG PO TABS
0.5000 mg | ORAL_TABLET | Freq: Three times a day (TID) | ORAL | Status: DC | PRN
  Administered 2023-02-05: 0.5 mg via ORAL
  Filled 2023-02-02: qty 1

## 2023-02-02 NOTE — Discharge Instructions (Addendum)
STROKE/TIA DISCHARGE INSTRUCTIONS SMOKING Cigarette smoking nearly doubles your risk of having a stroke & is the single most alterable risk factor  If you smoke or have smoked in the last 12 months, you are advised to quit smoking for your health. Most of the excess cardiovascular risk related to smoking disappears within a year of stopping. Ask you doctor about anti-smoking medications [place] Quit Line: 1-800-QUIT NOW Free Smoking Cessation Classes [phone]  CHOLESTEROL Know your levels; limit fat & cholesterol in your diet  Lipid Panel     Component Value Date/Time   CHOL 86 01/28/2023 0400   TRIG 55 01/28/2023 0400   HDL 29 (L) 01/28/2023 0400   CHOLHDL 3.0 01/28/2023 0400   VLDL 11 01/28/2023 0400   LDLCALC 46 01/28/2023 0400     Many patients benefit from treatment even if their cholesterol is at goal. Goal: Total Cholesterol (CHOL) less than 160 Goal:  Triglycerides (TRIG) less than 150 Goal:  HDL greater than 40 Goal:  LDL (LDLCALC) less than 100   BLOOD PRESSURE American Stroke Association blood pressure target is less that 120/80 mm/Hg  Your discharge blood pressure is:  BP: (!) 122/55 Monitor your blood pressure Limit your salt and alcohol intake Many individuals will require more than one medication for high blood pressure  DIABETES (A1c is a blood sugar average for last 3 months) Goal HGBA1c is under 7% (HBGA1c is blood sugar average for last 3 months)  Diabetes:    Lab Results  Component Value Date   HGBA1C 6.1 (H) 01/05/2023    Your HGBA1c can be lowered with medications, healthy diet, and exercise. Check your blood sugar as directed by your physician Call your physician if you experience unexplained or low blood sugars.  PHYSICAL ACTIVITY/REHABILITATION Goal is 30 minutes at least 4 days per week  Activity: Increase activity slowly, Therapies: Physical Therapy: Home Health Return to work:  Activity decreases your risk of heart attack and stroke and makes your  heart stronger.  It helps control your weight and blood pressure; helps you relax and can improve your mood. Participate in a regular exercise program. Talk with your doctor about the best form of exercise for you (dancing, walking, swimming, cycling).  DIET/WEIGHT Goal is to maintain a healthy weight  Your discharge diet is:  Diet Order             Diet heart healthy/carb modified Room service appropriate? Yes; Fluid consistency: Thin  Diet effective now                   liquids Your height is:  Height: 5\' 2"  (157.5 cm) Your current weight is: Weight: 83.6 kg Your Body Mass Index (BMI) is:  BMI (Calculated): 33.7 Following the type of diet specifically designed for you will help prevent another stroke. Your goal weight range is:   Your goal Body Mass Index (BMI) is 19-24. Healthy food habits can help reduce 3 risk factors for stroke:  High cholesterol, hypertension, and excess weight.  RESOURCES Stroke/Support Group:  Call [phone]   STROKE EDUCATION PROVIDED/REVIEWED AND GIVEN TO PATIENT Stroke warning signs and symptoms How to activate emergency medical system (call 911). Medications prescribed at discharge. Need for follow-up after discharge. Personal risk factors for stroke. Pneumonia vaccine given: No Flu vaccine given: No My questions have been answered, the writing is legible, and I understand these instructions.  I will adhere to these goals & educational materials that have been provided to me after  my discharge from the hospital.   Inpatient Rehab Discharge Instructions  [facility] Discharge date and time: No discharge date for patient encounter.   Activities/Precautions/ Functional Status: Activity: As tolerated Diet: Diabetic diet Wound Care: Routine skin checks Functional status:  ___ No restrictions     ___ Walk up steps independently ___ 24/7 supervision/assistance   ___ Walk up steps with assistance ___ Intermittent supervision/assistance  ___  Bathe/dress independently ___ Walk with walker     __x_ Bathe/dress with assistance ___ Walk Independently    ___ Shower independently ___ Walk with assistance    ___ Shower with assistance ___ No alcohol     ___ Return to work/school ________  COMMUNITY REFERRALS UPON DISCHARGE:    Home Health:   PT     OT                       Agency: [facility]  Phone: [phone]   Special Instructions: No driving smoking or alcohol   My questions have been answered and I understand these instructions. I will adhere to these goals and the provided educational materials after my discharge from the hospital.  Patient/Caregiver Signature _______________________________ Date __________  Clinician Signature _______________________________________ Date __________  Please bring this form and your medication list with you to all your follow-up doctor's appointments.

## 2023-02-02 NOTE — Progress Notes (Signed)
Inpatient Rehabilitation Admission Medication Review by a Pharmacist  A complete drug regimen review was completed for this patient to identify any potential clinically significant medication issues.  High Risk Drug Classes Is patient taking? Indication by Medication  Antipsychotic No   Anticoagulant Yes Apixban - AFib, CVA  Antibiotic No   Opioid Yes Oxycodone prn pain   Antiplatelet No   Hypoglycemics/insulin No   Vasoactive Medication Yes Metoprolol, torsemide, Entresto - CHF  Chemotherapy No   Other Yes Zofran prn N/V Trazodone prn sleep Allopurinol - gout Atorvastatin - HLD Citalopram - mood Clonazepam prn anxiety KCl - supplement     Type of Medication Issue Identified Description of Issue Recommendation(s)  Drug Interaction(s) (clinically significant)     Duplicate Therapy     Allergy     No Medication Administration End Date     Incorrect Dose     Additional Drug Therapy Needed     Significant med changes from prior encounter (inform family/care partners about these prior to discharge).    Other       Clinically significant medication issues were identified that warrant physician communication and completion of prescribed/recommended actions by midnight of the next day:  No  Name of provider notified for urgent issues identified:   Provider Method of Notification:     Pharmacist comments: None  Time spent performing this drug regimen review (minutes):  20 minutes Okey Regal, PharmD

## 2023-02-02 NOTE — Progress Notes (Signed)
Genice Rouge, MD  Physician Physical Medicine and Rehabilitation   PMR Pre-admission    Signed   Date of Service: 01/30/2023  2:54 PM  Related encounter: ED to Hosp-Admission (Discharged) from 01/27/2023 in [facility] 1C MEDICAL TELEMETRY   Signed     Expand All Collapse All  Show:Clear all [x] Written[x] Templated[x] Copied  Added by: [x] Brooke Brooking, RN[x] Theodoro Kos, Brooke Davis, CCC-SLP[x] Genice Rouge, MD  [] Hover for details PMR Admission Coordinator Pre-Admission Assessment   Patient: Brooke Davis is an 82 y.o., female MRN: [id number] DOB: [birth year] Height: 5\' 2"  (157.5 cm) Weight: 87.6 kg   Insurance Information HMO: yes    PPO:      PCP:      IPA:      80/20:      OTHER:  PRIMARY: U[employer] Medicare      Policy#: [id number]      Subscriber: patient CM Name: Brooke Davis      Phone#: [phone] option #3     Fax#: [phone] Pre-Cert#: [id number]   approved 7/16 until 7/22   Employer:  Benefits:  Phone #: online-[phone]     Name:  Eff. Date: 07/20/22-07/20/23     Deduct: does not have deductible      Out of Pocket Max: $4,500 ($324.60 met)      Life Max: NA CIR: $325/day co-pay for days 80-5, 100% coverage for days 6+     SNF: 100% coverage for days 1-20, $203/day co-pay for days 21-100 Outpatient: $20/visit co-pay     Co-Pay:  Home Health: 100% coverage      Co-Pay:  DME: 80% coverage     Co-Pay: 20% co-insurance Providers: in-network SECONDARY:       Policy#:      Phone#:    Artist:       Phone#:    The Engineer, materials Information Summary" for patients in Inpatient Rehabilitation Facilities with attached "Privacy Act Statement-Health Care Records" was provided and verbally reviewed with: Patient and Family   Emergency Contact Information Contact Information       Name Relation Home Work Mobile    [place] Daughter [phone]   [phone]    [facility] Daughter     [phone]          Other Contacts       Name Relation Home Work [place] C Daughter [phone]   [phone]    Mila Merry [phone]   [phone]    [place],Susan Daughter [phone]         Current Medical History  Patient Admitting Diagnosis: CVA   History of Present Illness:  Brooke Davis is a 82 year old right-handed female with history of type 2 diabetes mellitus, hypertension, hyperlipidemia, atrial fibrillation maintained on Eliquis followed by cardiology Dr.Shaukat Welton Flakes, 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 Francesco Sor, CAD status post PCI and stenting, diastolic congestive heart failure and anxiety.  Per chart review patient modified independent with rollator prior to admission.    Presented to [facility]/27/2023 with speech difficulty.  It was noted patient had been convalescing at home after recent hip surgery she had held her Eliquis for 3 days prior to that surgery.  Cranial CT scan negative for acute changes.  MRI limited by motion showing scattered foci of acute infarct in the left cerebral hemisphere and the left caudate.  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  Stable high-grade stenosis of the  proximal right ICA with greater than 80% stenosis.  50% stenosis of the origin of the dominant left vertebral artery.  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  No significant proximal stenosis aneurysm or branch vessel occlusion within the circle of Willis.  Patient did not receive TNK.  Admission chemistries unremarkable except sodium 134 glucose 123, hemoglobin 8.8.  Echo with ejection fraction of 60 to 65% no wall motion abnormalities.  Neurology follow-up patient currently remains on Eliquis as prior to admission with the addition of low-dose aspirin.  She is tolerating a regular consistency diet.  Vascular surgery was consulted in regards  to bilateral carotid artery stenosis and plan outpatient follow-up.  Palliative care was consulted to establish goals of care.    Patient continues to be weightbearing right lower extremity after recent right total hip arthroplasty.   Complete NIHSS TOTAL: 1   Patient's medical record [name] has been reviewed by the rehabilitation admission coordinator and physician.   Past Medical History      Past Medical History:  Diagnosis Date   Anxiety      a.) on BZO (alprazolam) PRN   Arthritis      fingers, right hip   Atrial fibrillation (HCC) 11/10/2013    a.) CHA2DS2VASc = 5 (age x2, sex, HTN, T2DM);  b.) rate/rhythm maintained on oral metoprolol tartrate; chronically anticoagulated with apixaban   Bronchitis     Cerebrovascular small vessel disease (chronic)     Coronary artery disease involving native coronary artery of native heart 01/11/2021    a.) LHC 01/11/2021: 40% oLM, 50% mLCx, 30% oLCx, 30% pLAD, 30% mLAD, 99% pRCA (2.75 x 30 mm Resolute Onyx DES), 75% p-mRCA, 60% dRCA   DDD (degenerative disc disease), lumbar 12/01/2013   Diastolic dysfunction 01/12/2021    a.) TTE 01/12/2021 (s/p inf STEMI): EF 55-60%, mild-mod LAE, mild RAE, mod RVE, mild AR/MR, mod TR, G1DD   Diet-controlled type 2 diabetes mellitus (HCC)     Full dentures     Gout     History of bilateral cataract extraction 2021   Hyperlipidemia     Hypertension, essential     Long term current use of anticoagulant      a.) apixaban   PONV (postoperative nausea and vomiting)     Sinoatrial node dysfunction (HCC) 11/10/2013   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) 01/11/2021    a.) LHC/PCI 01/11/2021 --> culprit lesion 99% pRCA --> 2.75 x 30 mm Resolute Onyx DES x 1   Varicose veins of legs     Wears hearing aid in both ears          Has the patient had major surgery during 100 days prior to admission? Yes 7/1 hip surgery   Family History   family history  includes Alcoholism in her brother; Diabetes in her father; Heart attack (age of onset: 18) in her mother; Stroke in her father.   Current Medications  Current Medications    Current Facility-Administered Medications:    acetaminophen (TYLENOL) tablet 650 mg, 650 mg, Oral, Q6H PRN **OR** acetaminophen (TYLENOL) suppository 650 mg, 650 mg, Rectal, Q6H PRN, Mansy, Jan A, MD   allopurinol (ZYLOPRIM) tablet 300 mg, 300 mg, Oral, Daily, Mansy, Jan A, MD, 300 mg at 02/02/23 1027   apixaban (ELIQUIS) tablet 5 mg, 5 mg, Oral, BID, Mansy, Jan A, MD, 5 mg at 02/02/23 1027   aspirin EC tablet 81 mg, 81  mg, Oral, Daily, Rejeana Brock, MD, 81 mg at 02/02/23 1027   atorvastatin (LIPITOR) tablet 80 mg, 80 mg, Oral, Daily, Mansy, Jan A, MD, 80 mg at 02/02/23 1027   bisacodyl (DULCOLAX) suppository 10 mg, 10 mg, Rectal, Daily PRN, Georgeann Oppenheim, Sudheer B, MD   citalopram (CELEXA) tablet 20 mg, 20 mg, Oral, Daily, Sreenath, Sudheer B, MD, 20 mg at 02/02/23 1027   clonazePAM (KLONOPIN) tablet 0.5 mg, 0.5 mg, Oral, TID PRN, Georgeann Oppenheim, Sudheer B, MD, 0.5 mg at 02/01/23 2106   feeding supplement (ENSURE ENLIVE / ENSURE PLUS) liquid 237 mL, 237 mL, Oral, BID BM, Sreenath, Sudheer B, MD, 237 mL at 02/02/23 1035   fluticasone (FLONASE) 50 MCG/ACT nasal spray 1 spray, 1 spray, Each Nare, Daily, Sreenath, Sudheer B, MD, 1 spray at 02/02/23 1027   liver oil-zinc oxide (DESITIN) 40 % ointment, , Topical, TID PRN, Georgeann Oppenheim, Sudheer B, MD   melatonin tablet 5 mg, 5 mg, Oral, QHS, Sreenath, Sudheer B, MD   metoprolol tartrate (LOPRESSOR) tablet 25 mg, 25 mg, Oral, BID, Mansy, Jan A, MD, 25 mg at 02/02/23 1027   ondansetron (ZOFRAN) tablet 4 mg, 4 mg, Oral, Q6H PRN **OR** ondansetron (ZOFRAN) injection 4 mg, 4 mg, Intravenous, Q6H PRN, Mansy, Jan A, MD, 4 mg at 01/29/23 5366   oxyCODONE (Oxy IR/ROXICODONE) immediate release tablet 5 mg, 5 mg, Oral, Q4H PRN, Mansy, Jan A, MD, 5 mg at 01/27/23 2214   polyethylene glycol  (MIRALAX / GLYCOLAX) packet 17 g, 17 g, Oral, Daily PRN, Sreenath, Sudheer B, MD   potassium chloride (KLOR-CON M) CR tablet 10 mEq, 10 mEq, Oral, Daily, Mansy, Jan A, MD, 10 mEq at 02/02/23 1027   sacubitril-valsartan (ENTRESTO) 24-26 mg per tablet, 1 tablet, Oral, BID, Georgeann Oppenheim, Sudheer B, MD, 1 tablet at 02/02/23 1027   senna-docusate (Senokot-S) tablet 1 tablet, 1 tablet, Oral, QHS, Sreenath, Sudheer B, MD   torsemide (DEMADEX) tablet 20 mg, 20 mg, Oral, Daily, Sreenath, Sudheer B, MD, 20 mg at 02/02/23 1027   traMADol (ULTRAM) tablet 50-100 mg, 50-100 mg, Oral, Q4H PRN, Mansy, Jan A, MD, 50 mg at 02/01/23 2228   traZODone (DESYREL) tablet 25 mg, 25 mg, Oral, QHS PRN, Mansy, Jan A, MD, 25 mg at 02/01/23 2106     Patients Current Diet:  Diet Order                  Diet - low sodium heart healthy             Diet regular Fluid consistency: Thin  Diet effective now                       Precautions / Restrictions Precautions Precautions: Posterior Hip Precaution Booklet Issued: No Precaution Comments: Verbally reminded pt of posterior hip precautions Restrictions Weight Bearing Restrictions: Yes RLE Weight Bearing: Weight bearing as tolerated Other Position/Activity Restrictions: RLE WBAT    Has the patient had 2 or more falls or a fall with injury in the past year? No   Prior Activity Level Limited Community (1-2x/wk): doctor's appointments   Prior Functional Level Self Care: Did the patient need help bathing, dressing, using the toilet or eating? Needed some help   Indoor Mobility: Did the patient need assistance with walking from room to room (with or without device)? Independent   Stairs: Did the patient need assistance with internal or external stairs (with or without device)? Pt unable to utilize stairs after hip surgery  Functional Cognition: Did the patient need help planning regular tasks such as shopping or remembering to take medications? Independent    Patient Information Are you of Hispanic, Latino/a,or Spanish origin?: X. Patient unable to respond, A. No, not of Hispanic, Latino/a, or Spanish origin What is your race?: X. Patient unable to respond, A. White Do you need or want an interpreter to communicate with a doctor or health care staff?: 9. Unable to respond   Patient's Response To:  Health Literacy and Transportation Is the patient able to respond to health literacy and transportation needs?: No Health Literacy - How often do you need to have someone help you when you read instructions, pamphlets, or other written material from your doctor or pharmacy?: Patient unable to respond (daughter reports pt able to read medical information prior to hospitalization) In the past 12 months, has lack of transportation kept you from medical appointments or from getting medications?:  (daughter reports pt hasn't missed any medical appointments due to transportation issues) In the past 12 months, has lack of transportation kept you from meetings, work, or from getting things needed for daily living?:  (daughter reports pt hasn't missed any non-medical appointments due to transportation issues)   Journalist, newspaper / Equipment Home Assistive Devices/Equipment: Environmental consultant (specify type), Wheelchair, Blood pressure cuff, Hearing aid, Eyeglasses, Bedside commode/3-in-1, Shower chair without back, Dentures (specify type), Other (Comment) (pulse ox) Home Equipment: Toilet riser, BSC/3in1, Rollator (4 wheels), Wheelchair - manual, Rolling Walker (2 wheels), Grab bars - toilet, Grab bars - tub/shower, Shower seat, Other (comment)   Prior Device Use: Indicate devices/aids used by the patient prior to current illness, exacerbation or injury? Walker (rollator)   Current Functional Level Cognition   Arousal/Alertness: Awake/alert Overall Cognitive Status: History of cognitive impairments - at baseline Current Attention Level: Sustained Orientation Level:  Oriented X4 Following Commands: Follows one step commands consistently Safety/Judgement: Decreased awareness of safety, Decreased awareness of deficits General Comments: Pt is very fatigued (family in room states she did not sleep last night), but able to state her needs ("give me just a moment"); quiet during session, although did state that NA did a good job yesterday. Attention: Focused Focused Attention: Appears intact Memory:  (will be further assessed) Awareness: Impaired Awareness Impairment: Intellectual impairment Problem Solving:  (will be further assessed) Executive Function:  (will be further assessed) Behaviors:  (none) Safety/Judgment:  (will be further assessed)    Extremity Assessment (includes Sensation/Coordination)   Upper Extremity Assessment: Overall WFL for tasks assessed, Generalized weakness RUE Deficits / Details: RUE deficits appear to be improving. BIL UE strength and ROM WNL today. Slight impairment of coordination on finger-nose in RUE today. Per family at bedside, pt is L-handed. RUE Sensation: decreased proprioception RUE Coordination: decreased fine motor, decreased gross motor  Lower Extremity Assessment: Defer to PT evaluation RLE Deficits / Details: Pt appearing to be slightly buckling in RLE today during mobility     ADLs   Overall ADL's : Needs assistance/impaired Eating/Feeding: Minimal assistance, Sitting Eating/Feeding Details (indicate cue type and reason): due to moderate R hand weakness; pt able to hold cup and bring to her mouth 1 attempt Grooming: Set up, Sitting, Wash/dry face, Oral care Grooming Details (indicate cue type and reason): pt seated on BSC next to sink and OT handing her items Lower Body Bathing: Maximal assistance Lower Body Bathing Details (indicate cue type and reason): anticipated Lower Body Dressing: Maximal assistance Lower Body Dressing Details (indicate cue type and reason): OT donned pt  socks at bed level Toilet  Transfer: Minimal assistance, Rolling walker (2 wheels) Toilet Transfer Details (indicate cue type and reason): BSC brought behind pt when she fatigued from mobility Toileting- Clothing Manipulation and Hygiene: Sitting/lateral lean, Min guard Toileting - Clothing Manipulation Details (indicate cue type and reason): seated on BSC for urine hygiene Functional mobility during ADLs: Rolling walker (2 wheels), Minimal assistance, +2 for physical assistance General ADL Comments: Pt able to walk approx 8 feet from bed to standing near sink, then needing to sit for rest break; sat on [facility] for grooming today.     Mobility   Overal bed mobility: Needs Assistance Bed Mobility: Supine to Sit Supine to sit: Min guard, HOB elevated Sit to supine: Min assist General bed mobility comments: min assist for LE's back into bed for comfort     Transfers   Overall transfer level: Needs assistance Equipment used: Rolling walker (2 wheels) Transfers: Sit to/from Stand Sit to Stand: Min assist (second person for safety) Bed to/from chair/wheelchair/BSC transfer type:: Step pivot, Lateral/scoot transfer Step pivot transfers: Min guard  Lateral/Scoot Transfers: Supervision General transfer comment: Pt not reaching back for surface before sitting; pt with BIL hands on RW during sit to stand. Will need increased training for safety in transfers and mobility     Ambulation / Gait / Stairs / Wheelchair Mobility   Ambulation/Gait Ambulation/Gait assistance: Min assist, +2 safety/equipment Gait Distance (Feet): 4 Feet Assistive device: Rolling walker (2 wheels) Gait Pattern/deviations: Decreased stride length, Decreased step length - right, Decreased step length - left, Step-through pattern General Gait Details: able to initiate walking towrds sink in room but unable to fully make distance Gait velocity: decreased     Posture / Balance Dynamic Sitting Balance Sitting balance - Comments: supervision static sitting  EOB Balance Overall balance assessment: Needs assistance Sitting-balance support: Feet supported Sitting balance-Leahy Scale: Good Sitting balance - Comments: supervision static sitting EOB Standing balance support: Bilateral upper extremity supported, Reliant on assistive device for balance, During functional activity Standing balance-Leahy Scale: Fair Standing balance comment: +2 for safety with walker     Special needs/care consideration Skin Surgical incision: thigh/anterior,right, Bladder Incontinence     Previous Home Environment  Living Arrangements: Other relatives (grandson and Art gallery manager)  Lives With: Family (grandson and Art gallery manager) Available Help at Discharge: Family, Available 24 hours/day Type of Home: House Home Layout: One level Home Access: Ramped entrance Bathroom Shower/Tub: Engineer, manufacturing systems: Handicapped height Bathroom Accessibility: Yes How Accessible: Accessible via walker Home Care Services: No (was recieving outpatient PT)   Discharge Living Setting Plans for Discharge Living Setting: Patient's home Type of Home at Discharge: House Discharge Home Layout: One level Discharge Home Access: Ramped entrance Discharge Bathroom Shower/Tub: Tub/shower unit Discharge Bathroom Toilet: Handicapped height Discharge Bathroom Accessibility: Yes How Accessible: Accessible via walker Does the patient have any problems obtaining your medications?: Yes (Describe) (expensive)   Social/Family/Support Systems Anticipated Caregiver: [name] (daughter) and other family Anticipated Caregiver's Contact Information: [name]: [phone] Caregiver Availability: 24/7 Discharge Plan Discussed with Primary Caregiver: Yes Is Caregiver In Agreement with Plan?: Yes Does Caregiver/Family have Issues with Lodging/Transportation while Pt is in Rehab?: No   Daughter, Lavonna Rua is with patient during the day and grandson otherwise   Goals Patient/Family  Goal for Rehab: Supervision: PT, Supervision-Min A:OT/ST, Expected length of stay: 7-10 days Pt/Family Agrees to Admission and willing to participate: Yes Program Orientation Provided & Reviewed with Pt/Caregiver Including Roles  & Responsibilities: Yes   Decrease burden of  Care through IP rehab admission: NA   Possible need for SNF placement upon discharge: Not anticipated   Patient Condition: I have reviewed medical records from [facility], spoken with CSW, and daughter. I discussed via phone for inpatient rehabilitation assessment.  Patient will benefit from ongoing PT, OT, and SLP, can actively participate in 3 hours of therapy a day 5 days of the week, and can make measurable gains during the admission.  Patient will also benefit from the coordinated team approach during an Inpatient Acute Rehabilitation admission.  The patient will receive intensive therapy as well as Rehabilitation physician, nursing, social worker, and care management interventions.  Due to bladder management, safety, skin/wound care, disease management, medication administration, pain management, and patient education the patient requires 24 hour a day rehabilitation nursing.  The patient is currently min to mod assist overall with mobility and basic ADLs.  Discharge setting and therapy post discharge at home with home health is anticipated.  Patient has agreed to participate in the Acute Inpatient Rehabilitation Program and will admit today.   Preadmission Screen Completed By:  Domingo Pulse, 02/02/2023 12:37 PM ______________________________________________________________________   Discussed status with Dr. Berline Chough on 02/02/23 at 1237 and received approval for admission today.   Admission Coordinator:  Domingo Pulse, CCC-SLP, time 1237 Date 02/02/23    Assessment/Plan: Diagnosis: L corona radiatia stroke-  Does the need for close, 24 hr/day Medical supervision in concert with the  patient's rehab needs make it unreasonable for this patient to be served in a less intensive setting? 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 Yes Does the patient require coordinated care of a physician, rehab nurse, PT, OT, and SLP to address physical and functional deficits in the context of the above medical diagnosis(es)? Yes Addressing deficits in the following areas: balance, endurance, locomotion, strength, transferring, bowel/bladder control, bathing, dressing, feeding, grooming, toileting, cognition, speech, language, and swallowing Can the patient actively participate in an intensive therapy program of at least 3 hrs of therapy 5 days a week? Yes The potential for patient to make measurable gains while on inpatient rehab is good Anticipated functional outcomes upon discharge from inpatient rehab: supervision PT, supervision and min assist OT, supervision and min assist SLP Estimated rehab length of stay to reach the above functional goals is: 7-10 days Anticipated discharge destination: Home 10. Overall Rehab/Functional Prognosis: good     MD Signature:           Revision History  Routing History

## 2023-02-02 NOTE — Progress Notes (Addendum)
Subjective: Patient is status post right total hip arthroplasty performed on 01/18/2023 by Dr. Ernest Pine.  She was admitted into the hospital after experiencing strokelike symptoms at home on 01/27/2023.  She was found to have a infarct in the left corona radiata.  Today she reports pain minimal to no pain in her right hip, does maintain that she is having some mild tenderness over the incision site.  States that she was having some difficulty working with physical therapy today, states that it was harder to get around and just felt fatigued, states that she has not been able to sleep well since being admitted to the hospital.  Denies any fevers, chills, N/V, SOB, chest pain, weakness or difficulty with speech or sensation. Patient seen in rounds with Dr. Ernest Pine. Urged patient to continue to work with therapy as she can tolerate. Daughter was on the phone with rehab facility during visit.  Insurance was approved for the patient, pending status for transition to rehab either today or tomorrow.  Objective: Vital signs in last 24 hours: Temp:  [98 F (36.7 C)-98.9 F (37.2 C)] 98 F (36.7 C) (07/16 0746) Pulse Rate:  [67-79] 74 (07/16 0746) Resp:  [16-20] 16 (07/16 0746) BP: (111-146)/(47-58) 146/58 (07/16 0746) SpO2:  [96 %-100 %] 100 % (07/16 0746)  Intake/Output from previous day:  Intake/Output Summary (Last 24 hours) at 02/02/2023 1225 Last data filed at 02/02/2023 1110 Gross per 24 hour  Intake 360 ml  Output 1100 ml  Net -740 ml    Intake/Output this shift: Total I/O In: 120 [P.O.:120] Out: 400 [Urine:400]  Labs: No results for input(s): "HGB" in the last 72 hours. No results for input(s): "WBC", "RBC", "HCT", "PLT" in the last 72 hours. No results for input(s): "NA", "K", "CL", "CO2", "BUN", "CREATININE", "GLUCOSE", "CALCIUM" in the last 72 hours. No results for input(s): "LABPT", "INR" in the last 72 hours.  EXAM General - Patient is Alert, Appropriate, and Oriented, admits to  having some difficulty with word finding but otherwise able to have a clear coherent conversation with provider and daughter Extremity - Neurologically intact ABD soft Neurovascular intact Sensation intact distally Intact pulses distally Dorsiflexion/Plantar flexion intact Incision: no drainage and mild redness appreciated along the staples.  Patient had just excellently had a urinary spell, there was some mild yellow urine appreciated on the honeycomb bandage.  There is no large erythema or swelling appreciated. No cellulitis present Compartment soft  Dressing -see description above.  The incision was dried and cleaned.  A new honeycomb dressing was applied over the patient's incision site given her urinary smell and.  Motor Function - intact, moving foot and toes well on exam.  Patient able to plantar and dorsiflex feet bilaterally with good ROM and strength.  Patient able to perform SLR with both the right and left legs, noted to be slightly weaker on her right side.  Patient is neurovascularly intact down her legs bilaterally.  Posterior tibial pulses appreciated 2+   Past Medical History:  Diagnosis Date   Anxiety    a.) on BZO (alprazolam) PRN   Arthritis    fingers, right hip   Atrial fibrillation (HCC) 11/10/2013   a.) CHA2DS2VASc = 5 (age x2, sex, HTN, T2DM);  b.) rate/rhythm maintained on oral metoprolol tartrate; chronically anticoagulated with apixaban   Bronchitis    Cerebrovascular small vessel disease (chronic)    Coronary artery disease involving native coronary artery of native heart 01/11/2021   a.) LHC 01/11/2021: 40% oLM, 50%  mLCx, 30% oLCx, 30% pLAD, 30% mLAD, 99% pRCA (2.75 x 30 mm Resolute Onyx DES), 75% p-mRCA, 60% dRCA   DDD (degenerative disc disease), lumbar 12/01/2013   Diastolic dysfunction 01/12/2021   a.) TTE 01/12/2021 (s/p inf STEMI): EF 55-60%, mild-mod LAE, mild RAE, mod RVE, mild AR/MR, mod TR, G1DD   Diet-controlled type 2 diabetes mellitus (HCC)     Full dentures    Gout    History of bilateral cataract extraction 2021   Hyperlipidemia    Hypertension, essential    Long term current use of anticoagulant    a.) apixaban   PONV (postoperative nausea and vomiting)    Sinoatrial node dysfunction (HCC) 11/10/2013   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) 01/11/2021   a.) LHC/PCI 01/11/2021 --> culprit lesion 99% pRCA --> 2.75 x 30 mm Resolute Onyx DES x 1   Varicose veins of legs    Wears hearing aid in both ears     Assessment/Plan:    Principal Problem:   Embolic stroke (HCC) Active Problems:   Chronic atrial fibrillation with RVR (HCC)   Dyslipidemia   Chronic diastolic CHF (congestive heart failure) (HCC)   Gout   Anxiety and depression   Right arm weakness   Acute CVA (cerebrovascular accident) (HCC)   Bilateral carotid artery stenosis  Estimated body mass index is 35.32 kg/m as calculated from the following:   Height as of this encounter: 5\' 2"  (1.575 m).   Weight as of this encounter: 87.6 kg.  The incision site was dried and cleaned and a new honeycomb dressing was applied over the patient's incision, there was no drainage appreciated at the incision site.  Discussed with daughter who states that she has not noticed any other drainage over the last day or so.  Continue to work with PT and OT as tolerated -working on strength, ROM and ambulation.  Patient is WBAT to her right side  Continue patient on blood thinning regimen to help prevent further DVT or embolic events.  Staples are still in place, will reevaluate drainage and pulling staples prior to patient leaving for rehabilitation  Plan is to discharge to a rehab facility.  She has gotten Therapist, occupational and is establishing a time that will work best for transition for the patient.  Rayburn Go, PA-C [facility] 02/02/2023, 12:25 PM

## 2023-02-02 NOTE — Progress Notes (Signed)
PROGRESS NOTE    Brooke Davis  UEA:[id number] DOB: 07/24/1940 DOA: 01/27/2023 PCP: [name], MD    Brief Narrative:  82 y.o. female with medical history significant for type 2 diabetes mellitus, hypertension, dyslipidemia, coronary artery disease status post PCI and stent, diastolic dysfunction, and anxiety, who presented to the emergency room with acute onset of altered mental status with confusion after waking up from a nap at 3 PM.  She started her nap at her baseline around 1 PM.  She was having slurred speech and expressive aphasia as well as right arm weakness and neglect.  She just had a right hip surgery on 7/1.  She denies any paresthesias.  No weakness seizures.  No tinnitus or vertigo.  She has been having constipation and low back pain.  She denies any fever or chills.  No chest pain or palpitations.  No cough or wheezing.  No dysuria, oliguria or hematuria or flank pain.   Symptoms had initially improved the emergency room however patient had a deterioration in neurologic function early a.m. on 7/11.  Repeat head CT without significant interval change.  Case discussed with neurology.  Patient acute worsening of neurologic status on 7/12.  Taken emergently for CAT scan.  No hemorrhage.  Follow-up MRI in 7/13 demonstrates possible new areas of infarct.  7/16: Lengthy discussions with patient, family, vascular surgery, neurology.  Plan at this moment is to secure a bed at [facility] inpatient rehab and have patient follow-up with vascular surgery 2 weeks post DC to discuss further intervention for carotid disease.   Assessment & Plan:   Principal Problem:   Embolic stroke [facility] Active Problems:   Chronic atrial fibrillation with RVR (HCC)   Chronic diastolic CHF (congestive heart failure) (HCC)   Dyslipidemia   Gout   Anxiety and depression   Right arm weakness   Acute CVA (cerebrovascular accident) (HCC)   Bilateral carotid artery stenosis  Acute CVA Bilateral carotid  artery stenosis left greater than right Embolic appearing however distribution of infarct is with in the left carotid artery territory.  Raises suspicion for carotid artery stenosis as primary driving factor.  This is not necessarily being considered an Eliquis treatment failure. Echocardiogram reassuring.  Negative bubble Deterioration in neurologic status noted 7/13 Improvement noted 7/14 Plan: Continue apixaban 5 mg daily Continue aspirin 81 mg daily Continue high intensity statin PT OT ST as tolerated  I discussed case with vascular surgery on 7/15.  I also relayed some of my concerns and recommendations to the family.  At this time the patient is moderate to severe risk for perioperative complications with carotid endarterectomy.    Current plan of care would be to discharge patient to CIR once we have insurance authorization.  Initiate rehabilitation/recovery process and have the patient follow-up with vascular surgery in the clinic to discuss endovascular options for carotid stenosis.  Chronic atrial fibrillation with RVR (HCC) PTA Eliquis and Lopressor   Chronic diastolic CHF (congestive heart failure) (HCC) Continue torsemide and Entresto Continue Lopressor   Anxiety and depression PTA Xanax and Celexa   Gout PTA allopurinol   Dyslipidemia High intensity statin  Functional decline Palliative care evaluation, patient remains full scope of care May benefit from outpatient palliative care referral   DVT prophylaxis: Apixaban Code Status: Full Family Communication: Daughter at bedside 7/11, 7/12, 7/13, 7/14, 7/15, 7/16 Disposition Plan: Status is: Inpatient Remains inpatient appropriate because: Acute CVA     Level of care: Telemetry Medical  Consultants:  Neurology  Vascular surgery  Procedures:  None  Antimicrobials: None   Subjective: Seen and examined.  Neurologic status unchanged.  Daughter at bedside  Objective: Vitals:   02/01/23 2051 02/02/23  0206 02/02/23 0517 02/02/23 0746  BP: (!) 132/57 (!) 114/52 (!) 142/56 (!) 146/58  Pulse: 71 79 67 74  Resp: 20  16 16   Temp: 98.9 F (37.2 C) 98.6 F (37 C) 98 F (36.7 C) 98 F (36.7 C)  TempSrc: Oral Oral    SpO2: 98% 96% 98% 100%  Weight:      Height:        Intake/Output Summary (Last 24 hours) at 02/02/2023 1029 Last data filed at 02/02/2023 1000 Gross per 24 hour  Intake 360 ml  Output 1100 ml  Net -740 ml   Filed Weights   01/27/23 1819 01/28/23 0100  Weight: 79.4 kg 87.6 kg    Examination:  General exam: No acute distress Respiratory system: Lungs clear normal work of breathing.  Room air Cardiovascular system: S1-S2, RRR, 2/6 systolic murmur Gastrointestinal system: Soft, NT/ND, normal bowel sounds Central nervous system: Alert.  Oriented x 2.  Right-sided weakness.  Right facial droop Extremities: RUE and RLE weakness Skin: No rashes, lesions or ulcers Psychiatry: Judgement and insight appear impaired. Mood & affect confused.     Data Reviewed: I have personally reviewed following labs and imaging studies  CBC: Recent Labs  Lab 01/27/23 1827 01/28/23 0400  WBC 8.3 7.7  HGB 8.8* 8.6*  HCT 28.1* 27.0*  MCV 101.1* 101.1*  PLT 311 295   Basic Metabolic Panel: Recent Labs  Lab 01/27/23 1827 01/28/23 0400  NA 134* 136  K 4.5 4.5  CL 100 104  CO2 25 27  GLUCOSE 123* 124*  BUN 15 14  CREATININE 0.99 1.03*  CALCIUM 8.8* 8.7*   GFR: Estimated Creatinine Clearance: 45.5 mL/min (A) (by C-G formula based on SCr of 1.03 mg/dL (H)). Liver Function Tests: Recent Labs  Lab 01/27/23 1827  AST 23  ALT 18  ALKPHOS 98  BILITOT 0.7  PROT 6.3*  ALBUMIN 2.7*   Recent Labs  Lab 01/27/23 1827  LIPASE 34   No results for input(s): "AMMONIA" in the last 168 hours. Coagulation Profile: No results for input(s): "INR", "PROTIME" in the last 168 hours. Cardiac Enzymes: No results for input(s): "CKTOTAL", "CKMB", "CKMBINDEX", "TROPONINI" in the last  168 hours. BNP (last 3 results) No results for input(s): "PROBNP" in the last 8760 hours. HbA1C: No results for input(s): "HGBA1C" in the last 72 hours. CBG: Recent Labs  Lab 01/29/23 1205 01/29/23 1607 01/30/23 0821 01/30/23 1241 01/30/23 1620  GLUCAP 168* 140* 117* 133* 143*   Lipid Profile: No results for input(s): "CHOL", "HDL", "LDLCALC", "TRIG", "CHOLHDL", "LDLDIRECT" in the last 72 hours.  Thyroid Function Tests: No results for input(s): "TSH", "T4TOTAL", "FREET4", "T3FREE", "THYROIDAB" in the last 72 hours. Anemia Panel: No results for input(s): "VITAMINB12", "FOLATE", "FERRITIN", "TIBC", "IRON", "RETICCTPCT" in the last 72 hours. Sepsis Labs: No results for input(s): "PROCALCITON", "LATICACIDVEN" in the last 168 hours.  No results found for this or any previous visit (from the past 240 hour(s)).       Radiology Studies: No results found.      Scheduled Meds:  allopurinol  300 mg Oral Daily   apixaban  5 mg Oral BID   aspirin EC  81 mg Oral Daily   atorvastatin  80 mg Oral Daily   citalopram  20 mg Oral Daily   feeding supplement  237 mL Oral BID BM   fluticasone  1 spray Each Nare Daily   melatonin  5 mg Oral QHS   metoprolol tartrate  25 mg Oral BID   potassium chloride  10 mEq Oral Daily   sacubitril-valsartan  1 tablet Oral BID   senna-docusate  1 tablet Oral QHS   torsemide  20 mg Oral Daily   Continuous Infusions:     LOS: 5 days    Tresa Moore, MD [facility]   If 7PM-7AM, please contact night-coverage  02/02/2023, 10:29 AM

## 2023-02-02 NOTE — H&P (Signed)
Physical Medicine and Rehabilitation Admission H&P    Chief Complaint  Patient presents with   right side neglect  : HPI: Brooke Davis. 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 Francesco Sor, CAD status post PCI and stenting, diastolic congestive heart failure and anxiety.  Per chart review patient modified independent with rollator prior to admission.  1 level home with ramped entrance.  She lives with family and assistance as needed.  Presented to [facility] 01/27/2023 with speech difficulty.  It was noted patient had been convalescing at home after recent hip surgery she had held her Eliquis for 3 days prior to that surgery.  Cranial CT scan negative for acute changes.  MRI limited by motion showing scattered foci of acute infarct in the left cerebral hemisphere and the left caudate.  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  50% stenosis of the origin of the dominant left vertebral artery.  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  No significant proximal stenosis aneurysm or branch vessel occlusion within the circle of Willis.  Patient did not receive TNK.  Admission chemistries unremarkable except sodium 134 glucose 123, hemoglobin 8.8.  Echo with ejection fraction of 60 to 65% no wall motion abnormalities.  Neurology follow-up patient currently remains on Eliquis as prior to admission with the addition of low-dose aspirin.  She is tolerating a regular consistency diet.  Vascular surgery was consulted in regards to bilateral carotid artery stenosis and plan outpatient follow-up.  Palliative care was consulted to establish goals of  care.  Therapies were initiated.  Patient continues to be weightbearing right lower extremity after recent right total hip arthroplasty.  Therapy evaluations completed due to patient decreased functional mobility was admitted for a comprehensive rehab program.   Pt reports bottom is sore from ride and having BM's.  Attempted to have BM on BSC- was not able to- LBM yesterday  Voiding ok with BSC Pain- not really any today- taking tramadol intermittently.   Denies N/V- dizziness, or visual changes.  Said that surgeon didn't want to remove her hip staples "quite yet".    Review of Systems  Constitutional:  Negative for chills and fever.  HENT:  Negative for hearing loss.   Eyes:  Negative for blurred vision and double vision.  Respiratory:  Negative for cough, shortness of breath and wheezing.   Cardiovascular:  Positive for palpitations.  Gastrointestinal:  Positive for constipation. Negative for heartburn, nausea and vomiting.  Genitourinary:  Negative for dysuria, flank pain and hematuria.  Musculoskeletal:  Positive for joint pain and myalgias.  Skin:  Negative for rash.  Neurological:  Positive for speech change.  Psychiatric/Behavioral:         Anxiety  All other systems reviewed and are negative.  Past Medical History:  Diagnosis Date   Anxiety    a.) on BZO (alprazolam) PRN   Arthritis    fingers, right hip   Atrial fibrillation (HCC) 11/10/2013   a.) CHA2DS2VASc = 5 (age x2, sex, HTN, T2DM);  b.) rate/rhythm maintained on oral metoprolol tartrate; chronically anticoagulated with apixaban   Bronchitis    Cerebrovascular small vessel disease (chronic)    Coronary artery disease involving native  coronary artery of native heart 01/11/2021   a.) LHC 01/11/2021: 40% oLM, 50% mLCx, 30% oLCx, 30% pLAD, 30% mLAD, 99% pRCA (2.75 x 30 mm Resolute Onyx DES), 75% p-mRCA, 60% dRCA   DDD (degenerative disc disease), lumbar 12/01/2013   Diastolic dysfunction 01/12/2021   a.) TTE  01/12/2021 (s/p inf STEMI): EF 55-60%, mild-mod LAE, mild RAE, mod RVE, mild AR/MR, mod TR, G1DD   Diet-controlled type 2 diabetes mellitus (HCC)    Full dentures    Gout    History of bilateral cataract extraction 2021   Hyperlipidemia    Hypertension, essential    Long term current use of anticoagulant    a.) apixaban   PONV (postoperative nausea and vomiting)    Sinoatrial node dysfunction (HCC) 11/10/2013   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  All OK.   CATARACT EXTRACTION W/PHACO Left 03/27/2020   Procedure: CATARACT EXTRACTION PHACO AND INTRAOCULAR LENS PLACEMENT (IOC) LEFT 7.49  00:56.4  13.3%;  Surgeon: Lockie Mola, MD;  Location: [facility];  Service: Ophthalmology;  Laterality: Left;   CATARACT EXTRACTION W/PHACO Right 04/24/2020   Procedure: CATARACT EXTRACTION PHACO AND INTRAOCULAR LENS PLACEMENT (IOC) RIGHT;  Surgeon: Lockie Mola, MD;  Location: [facility];  Service: Ophthalmology;  Laterality: Right;  8.34 0:50.2 16.6%   CORONARY/GRAFT ACUTE MI REVASCULARIZATION N/A 01/11/2021   Procedure: Coronary/Graft Acute MI Revascularization;  Surgeon: Marcina Millard, MD;  Location: [facility] INVASIVE CV LAB;  Service: Cardiovascular;  Laterality: N/A;   LEFT HEART CATH AND CORONARY ANGIOGRAPHY N/A 01/11/2021   Procedure: LEFT HEART CATH AND CORONARY ANGIOGRAPHY;  Surgeon: Marcina Millard, MD;  Location: [facility] INVASIVE CV [facility];  Service: Cardiovascular;  Laterality: N/A;   PARTIAL HYSTERECTOMY     TOE SURGERY Left    great toe; rod in place   TOTAL HIP ARTHROPLASTY Right 01/18/2023   Procedure: TOTAL HIP ARTHROPLASTY;   Surgeon: Donato Heinz, MD;  Location: [facility] ORS;  Service: Orthopedics;  Laterality: Right;   TOTAL KNEE ARTHROPLASTY Right 2007   Family History  Problem Relation Age of Onset   Heart attack Mother 8       required open heart surgery. Onset age is unknown.   Stroke Father    Diabetes Father    Alcoholism Brother    Breast cancer Neg Hx    Social History:  reports that she quit smoking about 44 years ago. Her smoking use included cigarettes. She has never used smokeless tobacco. She reports that she does not currently use alcohol. She reports that she does not use drugs. Allergies:  Allergies  Allergen Reactions   Celebrex [Celecoxib] Diarrhea   Penicillins Itching    IgE = 154 (WNL) on 01/05/2023   Relafen [Nabumetone]     Palpitation    Lovastatin Diarrhea   Medications Prior to Admission  Medication Sig Dispense Refill   allopurinol (ZYLOPRIM) 300 MG tablet Take 300 mg by mouth daily.     ALPRAZolam (XANAX) 0.25 MG tablet Take 0.25 mg by mouth 2 (two) times daily as needed for anxiety.     apixaban (ELIQUIS)  5 MG TABS tablet Take 5 mg by mouth 2 (two) times daily.     atorvastatin (LIPITOR) 80 MG tablet Take 1 tablet (80 mg total) by mouth daily. 30 tablet 0   citalopram (CELEXA) 10 MG tablet Take 10 mg by mouth daily.     metoprolol tartrate (LOPRESSOR) 25 MG tablet Take 1 tablet (25 mg total) by mouth 2 (two) times daily. 60 tablet 0   ondansetron (ZOFRAN-ODT) 4 MG disintegrating tablet Take 4 mg by mouth every 8 (eight) hours as needed.     oxyCODONE (OXY IR/ROXICODONE) 5 MG immediate release tablet Take 1 tablet (5 mg total) by mouth every 4 (four) hours as needed for moderate pain (pain score 4-6). 30 tablet 0   potassium chloride (KLOR-CON) 10 MEQ tablet Take 10 mEq by mouth daily.     sacubitril-valsartan (ENTRESTO) 24-26 MG Take 1 tablet by mouth 2 (two) times daily.     torsemide (DEMADEX) 20 MG tablet Take 20 mg by mouth daily.     traMADol (ULTRAM) 50 MG tablet  Take 1-2 tablets (50-100 mg total) by mouth every 4 (four) hours as needed for moderate pain. 30 tablet 0      Home: Home Living Family/patient expects to be discharged to:: Private residence Living Arrangements: Other relatives (grandson and Art gallery manager) Available Help at Discharge: Family, Available 24 hours/day Type of Home: House Home Access: Ramped entrance Home Layout: One level Bathroom Shower/Tub: Engineer, manufacturing systems: Handicapped height Bathroom Accessibility: Yes Home Equipment: Toilet riser, BSC/3in1, Rollator (4 wheels), Wheelchair - manual, Agricultural consultant (2 wheels), Grab bars - toilet, Grab bars - tub/shower, Information systems manager, Other (comment)  Lives With: Family (grandson and Art gallery manager)   Functional History: Prior Function Prior Level of Function : Needs assist Mobility Comments: MOD I with rollator ADLs Comments: assist for IADLs from family due to hip pain prior to hip surgery; generally MOD I for ADLs, assist for LB dressing after hip surgery  Functional Status:  Mobility: Bed Mobility Overal bed mobility: Needs Assistance Bed Mobility: Supine to Sit Supine to sit: Min guard, HOB elevated Sit to supine: Min assist General bed mobility comments: min assist for LE's back into bed for comfort Transfers Overall transfer level: Needs assistance Equipment used: Rolling walker (2 wheels) Transfers: Sit to/from Stand Sit to Stand: Min assist (second person for safety) Bed to/from chair/wheelchair/BSC transfer type:: Step pivot, Lateral/scoot transfer Step pivot transfers: Min guard  Lateral/Scoot Transfers: Supervision General transfer comment: Pt not reaching back for surface before sitting; pt with BIL hands on RW during sit to stand. Will need increased training for safety in transfers and mobility Ambulation/Gait Ambulation/Gait assistance: Min assist, +2 safety/equipment Gait Distance (Feet): 4 Feet Assistive device: Rolling walker (2  wheels) Gait Pattern/deviations: Decreased stride length, Decreased step length - right, Decreased step length - left, Step-through pattern General Gait Details: able to initiate walking towrds sink in room but unable to fully make distance Gait velocity: decreased    ADL: ADL Overall ADL's : Needs assistance/impaired Eating/Feeding: Minimal assistance, Sitting Eating/Feeding Details (indicate cue type and reason): due to moderate R hand weakness; pt able to hold cup and bring to her mouth 1 attempt Grooming: Set up, Sitting, Wash/dry face, Oral care Grooming Details (indicate cue type and reason): pt seated on BSC next to sink and OT handing her items Lower Body Bathing: Maximal assistance Lower Body Bathing Details (indicate cue type and reason): anticipated Lower Body Dressing: Maximal assistance Lower Body Dressing Details (indicate cue  type and reason): OT donned pt socks at bed level Toilet Transfer: Minimal assistance, Rolling walker (2 wheels) Toilet Transfer Details (indicate cue type and reason): BSC brought behind pt when she fatigued from mobility Toileting- Clothing Manipulation and Hygiene: Sitting/lateral lean, Min guard Toileting - Clothing Manipulation Details (indicate cue type and reason): seated on BSC for urine hygiene Functional mobility during ADLs: Rolling walker (2 wheels), Minimal assistance, +2 for physical assistance General ADL Comments: Pt able to walk approx 8 feet from bed to standing near sink, then needing to sit for rest break; sat on [facility] for grooming today.  Cognition: Cognition Overall Cognitive Status: History of cognitive impairments - at baseline Arousal/Alertness: Awake/alert Orientation Level: Oriented to person, Oriented to place, Oriented to time, Disoriented to situation Year: 2024 Month: July Day of Week: Incorrect Attention: Focused Focused Attention: Appears intact Memory:  (will be further assessed) Awareness: Impaired Awareness  Impairment: Intellectual impairment Problem Solving:  (will be further assessed) Executive Function:  (will be further assessed) Behaviors:  (none) Safety/Judgment:  (will be further assessed) Cognition Arousal/Alertness: Awake/alert Behavior During Therapy: WFL for tasks assessed/performed, Flat affect Overall Cognitive Status: History of cognitive impairments - at baseline Area of Impairment: Following commands Orientation Level: Person, Place Current Attention Level: Sustained Memory: Decreased short-term memory, Decreased recall of precautions Following Commands: Follows one step commands consistently Safety/Judgement: Decreased awareness of safety, Decreased awareness of deficits Awareness: Emergent Problem Solving: Slow processing General Comments: Pt is very fatigued (family in room states she did not sleep last night), but able to state her needs ("give me just a moment"); quiet during session, although did state that NA did a good job yesterday.  Physical Exam: Blood pressure (!) 114/52, pulse 79, temperature 98.6 F (37 C), temperature source Oral, resp. rate 20, height 5\' 2"  (1.575 m), weight 87.6 kg, SpO2 96%. Physical Exam Vitals and nursing note reviewed.  Constitutional:      Appearance: She is obese.     Comments: Small female- appears younger than stated age- awake, alert, transferred from [facility] to bed with min-mod A with therapy; sitting up in bed, NAD  HENT:     Head: Normocephalic and atraumatic.     Comments: Smile equal Tongue midline     Right Ear: External ear normal.     Left Ear: External ear normal.     Nose: Nose normal. No congestion.     Mouth/Throat:     Mouth: Mucous membranes are moist.     Pharynx: Oropharynx is clear. No oropharyngeal exudate.  Eyes:     General:        Right eye: No discharge.        Left eye: No discharge.     Extraocular Movements: Extraocular movements intact.     Comments: No nystagmus  Cardiovascular:     Rate and  Rhythm: Normal rate. Rhythm irregular.     Heart sounds: Normal heart sounds. No murmur heard.    No gallop.  Pulmonary:     Effort: Pulmonary effort is normal. No respiratory distress.     Breath sounds: Normal breath sounds. No wheezing, rhonchi or rales.  Abdominal:     General: There is no distension.     Palpations: Abdomen is soft.     Tenderness: There is no abdominal tenderness.     Comments: Hypoactive BS  Musculoskeletal:     Cervical back: Neck supple. No tenderness.     Comments: LUE 5/5 in biceps, triceps, WE< grip and  FA RUE just slightly less- so 5-/5 in same muscles LLE- 5/5 in HF, KE, KF< DF and PF RLE- HF 4-/5- limited by pain; KE/KF 4+/5; DF/PF 4+/5  Skin:    General: Skin is warm and dry.     Comments: IV in forearm- looks OK- otherwise, has honeycomb on R hip- looks ok - appears staples still intact?  Neurological:     Mental Status: She is alert.     Comments: Patient is alert.  Makes eye contact with examiner.  Provides name and place with some delay in processing.  She could name 2 out of 2 objects again with some delay. Pt has midl R sided neglect Decreased, mildly, LT on R side of body and equivocal on face  Psychiatric:     Comments: Slightly flat     No results found for this or any previous visit (from the past 48 hour(s)). No results found.    Blood pressure (!) 114/52, pulse 79, temperature 98.6 F (37 C), temperature source Oral, resp. rate 20, height 5\' 2"  (1.575 m), weight 87.6 kg, SpO2 96%.  Medical Problem List and Plan: 1. 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  Antithrombotics: -DVT/anticoagulation: Eliquis  -antiplatelet therapy: Aspirin 81 mg daily 3. Pain Management: Oxycodone/tramadol as needed- mainly taking tramadol prn- for R THP done 01/18/23 4.  Mood/Behavior/Sleep: Celexa 20 mg daily, Klonopin 0.5 mg 3 times daily as needed  -antipsychotic agents: N/A 5. Neuropsych/cognition: This patient is capable of making decisions on her own behalf. 6. Skin/Wound Care: Routine skin checks 7. Fluids/Electrolytes/Nutrition: Routine in and outs with follow-up chemistries 8.  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 Francesco Sor.  Weightbearing as tolerated- honeycomb still in place- per pt, wanted to leave staples "a few more days".  9.  Acute blood loss anemia after THA.  Follow-up CBC 10.  CAD/PCI and stenting.  Continue aspirin therapy 11.  Atrial fibrillation.  Maintained on Eliquis as well as Lopressor 25 mg twice daily.  Cardiac rate controlled.  Follow-up Dr.Shaukat Delbert [place].  Diastolic congestive heart failure.  Demadex 20 mg daily, Entresto 24-26 mg twice daily.  Monitor for any signs of fluid overload 13.  Hyperlipidemia.  Lipitor 14.  History of gout.  Zyloprim 300 mg daily.  Monitor for any gout flareups 15.  Type 2 diabetes mellitus.  Hemoglobin A1c 6.1.  Patient on no medication prior to admission.  CBGs discontinued. 16.  Bilateral carotid stenosis.  Follow-up outpatient vascular surgery Dr. Franklyn Lor, PA-C 02/02/2023  I have personally performed a face to face diagnostic evaluation of this patient and formulated the key components of the plan.  Additionally, I have personally reviewed laboratory data, imaging studies, as well as relevant notes and concur with the physician assistant's documentation above.   The patient's status has not changed from the original H&P.  Any changes in documentation from the acute care chart have been noted above.

## 2023-02-02 NOTE — Progress Notes (Addendum)
  Inpatient Rehabilitation Admissions Coordinator   We have received insurance approval for CIR after peer to peer with Dr Georgeann Oppenheim. I spoke with daughter, Lavonna Rua by phone. We reviewed estimated cost of care and she and patient are in agreement to admit to CIR today. I will verify room number to admit and arrange [facility] today. Dr Berline Chough to be admitting MD to Rehab.  Ottie Glazier, RN, MSN Rehab Admissions Coordinator [phone] 02/02/2023 12:28 PM  Patient to be admitted to [address]. I have contacted [facility] for pickup as soon as truck available.  Ottie Glazier, RN, MSN Rehab Admissions Coordinator [phone] 02/02/2023 12:53 PM

## 2023-02-02 NOTE — Care Management Important Message (Signed)
Important Message  Patient Details  Name: Brooke Davis MRN: [id number] Date of Birth: [birth year]   Medicare Important Message Given:  N/A - LOS <3 / Initial given by admissions     Olegario Messier A [place] Cloe 02/02/2023, 9:24 AM

## 2023-02-02 NOTE — Progress Notes (Signed)
Pt called out reporting chest pain. VS WNL except for temp 100.9. [place], [place] and received orders for UA/CX, chest xray, and give tylenol. Upon entering room to administer Tylenol pt states that the chest pain has resolved. Pt resting comfortably in bed. No other complaints voiced.

## 2023-02-02 NOTE — Progress Notes (Signed)
OT Cancellation Note  Patient Details Name: Brooke Davis MRN: [id number] DOB: 29-Nov-1940   Cancelled Treatment:    Reason Eval/Treat Not Completed: Patient declined, no reason specified. OT continues to follow pt for acute rehab services. Upon arrival to pt room, pt supine in bed. She politely declines OT session this AM stating she had just gotten back to bed and would like to "rest a little" between sessions. Will hold at this time and re-attempt at a later date/time as available and pt medically appropriate.   Rockney Ghee, M.S., OTR/L 02/02/23, 12:22 PM

## 2023-02-02 NOTE — Plan of Care (Signed)
Problem: Education: Goal: Knowledge of the prescribed therapeutic regimen will improve Outcome: Progressing Goal: Understanding of discharge needs will improve Outcome: Progressing Goal: Individualized Educational Video(s) Outcome: Progressing   Problem: Activity: Goal: Ability to avoid complications of mobility impairment will improve Outcome: Progressing Goal: Ability to tolerate increased activity will improve Outcome: Progressing   Problem: Clinical Measurements: Goal: Postoperative complications will be avoided or minimized Outcome: Progressing   Problem: Pain Management: Goal: Pain level will decrease with appropriate interventions Outcome: Progressing   Problem: Skin Integrity: Goal: Will show signs of wound healing Outcome: Progressing   Problem: Education: Goal: Knowledge of disease or condition will improve Outcome: Progressing Goal: Knowledge of secondary prevention will improve (MUST DOCUMENT ALL) Outcome: Progressing Goal: Knowledge of patient specific risk factors will improve Brooke Davis N/A or DELETE if not current risk factor) Outcome: Progressing   Problem: Ischemic Stroke/TIA Tissue Perfusion: Goal: Complications of ischemic stroke/TIA will be minimized Outcome: Progressing   Problem: Coping: Goal: Will verbalize positive feelings about self Outcome: Progressing Goal: Will identify appropriate support needs Outcome: Progressing   Problem: Health Behavior/Discharge Planning: Goal: Ability to manage health-related needs will improve Outcome: Progressing Goal: Goals will be collaboratively established with patient/family Outcome: Progressing   Problem: Self-Care: Goal: Ability to participate in self-care as condition permits will improve Outcome: Progressing Goal: Verbalization of feelings and concerns over difficulty with self-care will improve Outcome: Progressing Goal: Ability to communicate needs accurately will improve Outcome: Progressing    Problem: Nutrition: Goal: Risk of aspiration will decrease Outcome: Progressing Goal: Dietary intake will improve Outcome: Progressing   Problem: Education: Goal: Knowledge of General Education information will improve Description: Including pain rating scale, medication(s)/side effects and non-pharmacologic comfort measures Outcome: Progressing   Problem: Health Behavior/Discharge Planning: Goal: Ability to manage health-related needs will improve Outcome: Progressing   Problem: Clinical Measurements: Goal: Ability to maintain clinical measurements within normal limits will improve Outcome: Progressing Goal: Will remain free from infection Outcome: Progressing Goal: Diagnostic test results will improve Outcome: Progressing Goal: Respiratory complications will improve Outcome: Progressing Goal: Cardiovascular complication will be avoided Outcome: Progressing   Problem: Activity: Goal: Risk for activity intolerance will decrease Outcome: Progressing   Problem: Nutrition: Goal: Adequate nutrition will be maintained Outcome: Progressing   Problem: Coping: Goal: Level of anxiety will decrease Outcome: Progressing   Problem: Elimination: Goal: Will not experience complications related to bowel motility Outcome: Progressing Goal: Will not experience complications related to urinary retention Outcome: Progressing   Problem: Pain Managment: Goal: General experience of comfort will improve Outcome: Progressing   Problem: Safety: Goal: Ability to remain free from injury will improve Outcome: Progressing   Problem: Skin Integrity: Goal: Risk for impaired skin integrity will decrease Outcome: Progressing   Problem: Education: Goal: Ability to describe self-care measures that may prevent or decrease complications (Diabetes Survival Skills Education) will improve Outcome: Progressing Goal: Individualized Educational Video(s) Outcome: Progressing   Problem:  Coping: Goal: Ability to adjust to condition or change in health will improve Outcome: Progressing   Problem: Fluid Volume: Goal: Ability to maintain a balanced intake and output will improve Outcome: Progressing   Problem: Health Behavior/Discharge Planning: Goal: Ability to identify and utilize available resources and services will improve Outcome: Progressing Goal: Ability to manage health-related needs will improve Outcome: Progressing   Problem: Metabolic: Goal: Ability to maintain appropriate glucose levels will improve Outcome: Progressing   Problem: Nutritional: Goal: Maintenance of adequate nutrition will improve Outcome: Progressing Goal: Progress toward achieving an optimal weight will improve  Outcome: Progressing   Problem: Skin Integrity: Goal: Risk for impaired skin integrity will decrease Outcome: Progressing   Problem: Tissue Perfusion: Goal: Adequacy of tissue perfusion will improve Outcome: Progressing

## 2023-02-02 NOTE — Plan of Care (Signed)
PMT consulted for GOC. In to see patient, however patient is not in room. Will follow up at a later time if still admitted.

## 2023-02-02 NOTE — H&P (Signed)
Physical Medicine and Rehabilitation Admission H&P        Chief Complaint  Patient presents with   right side neglect  : HPI: Brooke Bicknell. Davis is a 82 year old right-handed female with history of type 2 diabetes mellitus, hypertension, hyperlipidemia, atrial fibrillation maintained on Eliquis followed by cardiology Dr.Shaukat Welton Flakes, 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 Francesco Sor, CAD status post PCI and stenting, diastolic congestive heart failure and anxiety.  Per chart review patient modified independent with rollator prior to admission.  1 level home with ramped entrance.  She lives with family and assistance as needed.  Presented to [facility]/27/2023 with speech difficulty.  It was noted patient had been convalescing at home after recent hip surgery she had held her Eliquis for 3 days prior to that surgery.  Cranial CT scan negative for acute changes.  MRI limited by motion showing scattered foci of acute infarct in the left cerebral hemisphere and the left caudate.  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  50% stenosis of the origin of the dominant left vertebral artery.  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  No significant proximal stenosis aneurysm or branch vessel occlusion within the circle of Willis.  Patient did not receive TNK.  Admission chemistries unremarkable except sodium 134 glucose 123, hemoglobin 8.8.  Echo with ejection fraction of 60 to 65% no wall motion abnormalities.  Neurology follow-up patient currently remains on Eliquis as prior to admission with the addition of low-dose aspirin.  She is tolerating a regular consistency diet.  Vascular surgery was consulted in regards to bilateral carotid artery stenosis and plan outpatient follow-up.  Palliative care was consulted to establish  goals of care.  Therapies were initiated.  Patient continues to be weightbearing right lower extremity after recent right total hip arthroplasty.  Therapy evaluations completed due to patient decreased functional mobility was admitted for a comprehensive rehab program.     Pt reports bottom is sore from ride and having BM's.  Attempted to have BM on BSC- was not able to- LBM yesterday   Voiding ok with BSC Pain- not really any today- taking tramadol intermittently.    Denies N/V- dizziness, or visual changes.  Said that surgeon didn't want to remove her hip staples "quite yet".      Review of Systems  Constitutional:  Negative for chills and fever.  HENT:  Negative for hearing loss.   Eyes:  Negative for blurred vision and double vision.  Respiratory:  Negative for cough, shortness of breath and wheezing.   Cardiovascular:  Positive for palpitations.  Gastrointestinal:  Positive for constipation. Negative for heartburn, nausea and vomiting.  Genitourinary:  Negative for dysuria, flank pain and hematuria.  Musculoskeletal:  Positive for joint pain and myalgias.  Skin:  Negative for rash.  Neurological:  Positive for speech change.  Psychiatric/Behavioral:         Anxiety  All other systems reviewed and are negative.       Past Medical History:  Diagnosis Date   Anxiety      a.) on BZO (alprazolam) PRN   Arthritis      fingers, right hip   Atrial fibrillation (HCC) 11/10/2013    a.) CHA2DS2VASc = 5 (age x2, sex, HTN, T2DM);  b.) rate/rhythm maintained on oral metoprolol tartrate; chronically  anticoagulated with apixaban   Bronchitis     Cerebrovascular small vessel disease (chronic)     Coronary artery disease involving native coronary artery of native heart 01/11/2021    a.) LHC 01/11/2021: 40% oLM, 50% mLCx, 30% oLCx, 30% pLAD, 30% mLAD, 99% pRCA (2.75 x 30 mm Resolute Onyx DES), 75% p-mRCA, 60% dRCA   DDD (degenerative disc disease), lumbar 12/01/2013   Diastolic  dysfunction 01/12/2021    a.) TTE 01/12/2021 (s/p inf STEMI): EF 55-60%, mild-mod LAE, mild RAE, mod RVE, mild AR/MR, mod TR, G1DD   Diet-controlled type 2 diabetes mellitus (HCC)     Full dentures     Gout     History of bilateral cataract extraction 2021   Hyperlipidemia     Hypertension, essential     Long term current use of anticoagulant      a.) apixaban   PONV (postoperative nausea and vomiting)     Sinoatrial node dysfunction (HCC) 11/10/2013   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  All OK.   CATARACT EXTRACTION W/PHACO Left 03/27/2020    Procedure: CATARACT EXTRACTION PHACO AND INTRAOCULAR LENS PLACEMENT (IOC) LEFT 7.49  00:56.4  13.3%;  Surgeon: Lockie Mola, MD;  Location: [facility];  Service: Ophthalmology;  Laterality: Left;   CATARACT EXTRACTION W/PHACO Right 04/24/2020    Procedure: CATARACT EXTRACTION PHACO AND INTRAOCULAR LENS PLACEMENT (IOC) RIGHT;  Surgeon: Lockie Mola, MD;  Location: [facility];  Service: Ophthalmology;  Laterality: Right;  8.34 0:50.2 16.6%   CORONARY/GRAFT ACUTE MI REVASCULARIZATION N/A 01/11/2021    Procedure: Coronary/Graft Acute MI Revascularization;  Surgeon: Marcina Millard, MD;  Location: [facility] INVASIVE CV LAB;  Service: Cardiovascular;  Laterality: N/A;   LEFT HEART CATH AND CORONARY ANGIOGRAPHY N/A 01/11/2021    Procedure: LEFT HEART CATH AND CORONARY ANGIOGRAPHY;  Surgeon: Marcina Millard, MD;  Location: [facility] INVASIVE CV LAB;  Service: Cardiovascular;  Laterality: N/A;   PARTIAL HYSTERECTOMY       TOE SURGERY Left      great toe; rod in place   TOTAL HIP  ARTHROPLASTY Right 01/18/2023    Procedure: TOTAL HIP ARTHROPLASTY;  Surgeon: Donato Heinz, MD;  Location: [facility] ORS;  Service: Orthopedics;  Laterality: Right;   TOTAL KNEE ARTHROPLASTY Right 2007             Family History  Problem Relation Age of Onset   Heart attack Mother 60        required open heart surgery. Onset age is unknown.   Stroke Father     Diabetes Father     Alcoholism Brother     Breast cancer Neg Hx          Social History:  reports that she quit smoking about 44 years ago. Her smoking use included cigarettes. She has never used smokeless tobacco. She reports that she does not currently use alcohol. She reports that she does not use drugs. Allergies:  Allergies       Allergies  Allergen Reactions   Celebrex [Celecoxib] Diarrhea   Penicillins Itching      IgE = 154 (WNL) on 01/05/2023   Relafen [Nabumetone]        Palpitation     Lovastatin Diarrhea            Medications Prior to Admission  Medication Sig Dispense Refill   allopurinol (ZYLOPRIM) 300 MG tablet Take 300 mg by mouth daily.       ALPRAZolam (XANAX) 0.25 MG tablet Take 0.25 mg by mouth 2 (two) times daily as needed for anxiety.       apixaban (ELIQUIS) 5 MG TABS tablet Take 5 mg by mouth 2 (two) times daily.       atorvastatin (LIPITOR) 80 MG tablet Take 1 tablet (80 mg total) by mouth daily. 30 tablet 0   citalopram (CELEXA) 10 MG tablet Take 10 mg by mouth daily.       metoprolol tartrate (LOPRESSOR) 25 MG tablet Take 1 tablet (25 mg total) by mouth 2 (two) times daily. 60 tablet 0   ondansetron (ZOFRAN-ODT) 4 MG disintegrating tablet Take 4 mg by mouth every 8 (eight) hours as needed.       oxyCODONE (OXY IR/ROXICODONE) 5 MG immediate release tablet Take 1 tablet (5 mg total) by mouth every 4 (four) hours as needed for moderate pain (pain score 4-6). 30 tablet 0   potassium chloride (KLOR-CON) 10 MEQ tablet Take 10 mEq by mouth daily.       sacubitril-valsartan (ENTRESTO) 24-26 MG Take 1  tablet by mouth 2 (two) times daily.       torsemide (DEMADEX) 20 MG tablet Take 20 mg by mouth daily.       traMADol (ULTRAM) 50 MG tablet Take 1-2 tablets (50-100 mg total) by mouth every 4 (four) hours as needed for moderate pain. 30 tablet 0              Home: Home Living Family/patient expects to be discharged to:: Private residence Living Arrangements: Other relatives (grandson and Art gallery manager) Available Help at Discharge: Family, Available 24 hours/day Type of Home: House Home Access: Ramped entrance Home Layout: One level Bathroom Shower/Tub: Engineer, manufacturing systems: Handicapped height Bathroom Accessibility: Yes Home Equipment: Toilet riser, BSC/3in1, Rollator (4 wheels), Wheelchair - manual, Agricultural consultant (2 wheels), Grab bars - toilet, Grab bars - tub/shower, Information systems manager, Other (comment)  Lives With: Family (grandson and Art gallery manager)   Functional History: Prior Function Prior Level of Function : Needs assist Mobility Comments: MOD I with rollator ADLs Comments: assist for IADLs from family due to hip pain prior to hip surgery; generally MOD I for ADLs, assist for LB dressing after hip surgery   Functional Status:  Mobility: Bed Mobility Overal bed mobility: Needs Assistance Bed Mobility: Supine to Sit Supine to sit: Min guard, HOB elevated Sit to supine: Min assist General bed mobility comments: min assist for LE's back into bed for comfort Transfers Overall transfer level: Needs assistance Equipment used: Rolling walker (2 wheels) Transfers: Sit to/from Stand Sit to Stand: Min assist (second person for safety) Bed to/from chair/wheelchair/BSC transfer type:: Step pivot, Lateral/scoot transfer Step pivot transfers: Min guard  Lateral/Scoot Transfers: Supervision General transfer comment: Pt not reaching back for surface before sitting; pt with BIL hands on RW during sit to stand. Will need increased training for safety in transfers and  mobility Ambulation/Gait Ambulation/Gait assistance: Min assist, +2 safety/equipment Gait Distance (Feet): 4 Feet Assistive device: Rolling walker (2 wheels) Gait Pattern/deviations: Decreased  stride length, Decreased step length - right, Decreased step length - left, Step-through pattern General Gait Details: able to initiate walking towrds sink in room but unable to fully make distance Gait velocity: decreased   ADL: ADL Overall ADL's : Needs assistance/impaired Eating/Feeding: Minimal assistance, Sitting Eating/Feeding Details (indicate cue type and reason): due to moderate R hand weakness; pt able to hold cup and bring to her mouth 1 attempt Grooming: Set up, Sitting, Wash/dry face, Oral care Grooming Details (indicate cue type and reason): pt seated on BSC next to sink and OT handing her items Lower Body Bathing: Maximal assistance Lower Body Bathing Details (indicate cue type and reason): anticipated Lower Body Dressing: Maximal assistance Lower Body Dressing Details (indicate cue type and reason): OT donned pt socks at bed level Toilet Transfer: Minimal assistance, Rolling walker (2 wheels) Toilet Transfer Details (indicate cue type and reason): BSC brought behind pt when she fatigued from mobility Toileting- Clothing Manipulation and Hygiene: Sitting/lateral lean, Min guard Toileting - Clothing Manipulation Details (indicate cue type and reason): seated on BSC for urine hygiene Functional mobility during ADLs: Rolling walker (2 wheels), Minimal assistance, +2 for physical assistance General ADL Comments: Pt able to walk approx 8 feet from bed to standing near sink, then needing to sit for rest break; sat on [facility] for grooming today.   Cognition: Cognition Overall Cognitive Status: History of cognitive impairments - at baseline Arousal/Alertness: Awake/alert Orientation Level: Oriented to person, Oriented to place, Oriented to time, Disoriented to situation Year: 2024 Month:  July Day of Week: Incorrect Attention: Focused Focused Attention: Appears intact Memory:  (will be further assessed) Awareness: Impaired Awareness Impairment: Intellectual impairment Problem Solving:  (will be further assessed) Executive Function:  (will be further assessed) Behaviors:  (none) Safety/Judgment:  (will be further assessed) Cognition Arousal/Alertness: Awake/alert Behavior During Therapy: WFL for tasks assessed/performed, Flat affect Overall Cognitive Status: History of cognitive impairments - at baseline Area of Impairment: Following commands Orientation Level: Person, Place Current Attention Level: Sustained Memory: Decreased short-term memory, Decreased recall of precautions Following Commands: Follows one step commands consistently Safety/Judgement: Decreased awareness of safety, Decreased awareness of deficits Awareness: Emergent Problem Solving: Slow processing General Comments: Pt is very fatigued (family in room states she did not sleep last night), but able to state her needs ("give me just a moment"); quiet during session, although did state that NA did a good job yesterday.   Physical Exam: Blood pressure (!) 114/52, pulse 79, temperature 98.6 F (37 C), temperature source Oral, resp. rate 20, height 5\' 2"  (1.575 m), weight 87.6 kg, SpO2 96%. Physical Exam Vitals and nursing note reviewed.  Constitutional:      Appearance: She is obese.     Comments: Small female- appears younger than stated age- awake, alert, transferred from [facility] to bed with min-mod A with therapy; sitting up in bed, NAD  HENT:     Head: Normocephalic and atraumatic.     Comments: Smile equal Tongue midline      Right Ear: External ear normal.     Left Ear: External ear normal.     Nose: Nose normal. No congestion.     Mouth/Throat:     Mouth: Mucous membranes are moist.     Pharynx: Oropharynx is clear. No oropharyngeal exudate.  Eyes:     General:        Right eye: No  discharge.        Left eye: No discharge.     Extraocular Movements: Extraocular  movements intact.     Comments: No nystagmus  Cardiovascular:     Rate and Rhythm: Normal rate. Rhythm irregular.     Heart sounds: Normal heart sounds. No murmur heard.    No gallop.  Pulmonary:     Effort: Pulmonary effort is normal. No respiratory distress.     Breath sounds: Normal breath sounds. No wheezing, rhonchi or rales.  Abdominal:     General: There is no distension.     Palpations: Abdomen is soft.     Tenderness: There is no abdominal tenderness.     Comments: Hypoactive BS  Musculoskeletal:     Cervical back: Neck supple. No tenderness.     Comments: LUE 5/5 in biceps, triceps, WE< grip and FA RUE just slightly less- so 5-/5 in same muscles LLE- 5/5 in HF, KE, KF< DF and PF RLE- HF 4-/5- limited by pain; KE/KF 4+/5; DF/PF 4+/5  Skin:    General: Skin is warm and dry.     Comments: IV in forearm- looks OK- otherwise, has honeycomb on R hip- looks ok - appears staples still intact?  Neurological:     Mental Status: She is alert.     Comments: Patient is alert.  Makes eye contact with examiner.  Provides name and place with some delay in processing.  She could name 2 out of 2 objects again with some delay. Pt has midl R sided neglect Decreased, mildly, LT on R side of body and equivocal on face  Psychiatric:     Comments: Slightly flat        Lab Results Last 48 Hours  No results found for this or any previous visit (from the past 48 hour(s)).   Imaging Results (Last 48 hours)  No results found.         Blood pressure (!) 114/52, pulse 79, temperature 98.6 F (37 C), temperature source Oral, resp. rate 20, height 5\' 2"  (1.575 m), weight 87.6 kg, SpO2 96%.   Medical Problem List and Plan: 1. 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  Antithrombotics: -DVT/anticoagulation: Eliquis             -antiplatelet therapy: Aspirin 81 mg daily 3. Pain Management: Oxycodone/tramadol as needed- mainly taking tramadol prn- for R THP done 01/18/23 4. Mood/Behavior/Sleep: Celexa 20 mg daily, Klonopin 0.5 mg 3 times daily as needed             -antipsychotic agents: N/A 5. Neuropsych/cognition: This patient is capable of making decisions on her own behalf. 6. Skin/Wound Care: Routine skin checks 7. Fluids/Electrolytes/Nutrition: Routine in and outs with follow-up chemistries 8.  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 Francesco Sor.  Weightbearing as tolerated- honeycomb still in place- per pt, wanted to leave staples "a few more days".  9.  Acute blood loss anemia after THA.  Follow-up CBC 10.  CAD/PCI and stenting.  Continue aspirin therapy 11.  Atrial fibrillation.  Maintained on Eliquis as well as Lopressor 25 mg twice daily.  Cardiac rate controlled.  Follow-up Dr.Shaukat Delbert [place].  Diastolic congestive heart failure.  Demadex 20 mg daily, Entresto 24-26 mg twice  daily.  Monitor for any signs of fluid overload 13.  Hyperlipidemia.  Lipitor 14.  History of gout.  Zyloprim 300 mg daily.  Monitor for any gout flareups 15.  Type 2 diabetes mellitus.  Hemoglobin A1c 6.1.  Patient on no medication prior to admission.  CBGs discontinued. 16.  Bilateral carotid stenosis.  Follow-up outpatient vascular surgery Dr. Franklyn Lor, PA-C 02/02/2023   I have personally performed a face to face diagnostic evaluation of this patient and formulated the key components of the plan.  Additionally, I have personally reviewed laboratory data, imaging studies, as well as relevant notes and concur with the physician assistant's documentation above.   The patient's status has not changed from the original H&P.  Any changes in documentation from the acute care chart have  been noted above.

## 2023-02-02 NOTE — TOC Progression Note (Signed)
Transition of Care [facility]) - Progression Note    Patient Details  Name: Brooke Davis MRN: [id number] Date of Birth: March 18, 1941  Transition of Care [facility]) CM/SW Contact  Allena Katz, LCSW Phone Number: 02/02/2023, 11:53 AM  Clinical Narrative:   insurance called wanting to do a  peer to peer for CIR [phone] option 5, deadline is today by 4pm. MD notified.         Expected Discharge Plan and Services                                               Social Determinants of Health (SDOH) Interventions SDOH Screenings   Food Insecurity: No Food Insecurity (01/28/2023)  Housing: Low Risk  (01/28/2023)  Transportation Needs: No Transportation Needs (01/28/2023)  Utilities: Not At Risk (01/28/2023)  Financial Resource Strain: Patient Declined (11/05/2022)   Received [facility], [facility]  Tobacco Use: Medium Risk (01/28/2023)    Readmission Risk Interventions     No data to display

## 2023-02-02 NOTE — Progress Notes (Signed)
  Inpatient Rehabilitation Admissions Coordinator   I await insurance determination for a possible Cir admit.  Ottie Glazier, RN, MSN Rehab Admissions Coordinator [phone] 02/02/2023 11:34 AM'

## 2023-02-02 NOTE — Discharge Summary (Signed)
Physician Discharge Summary  Brooke Davis WUJ:[id number] DOB: 1940-07-24 DOA: 01/27/2023  PCP: [name], MD  Admit date: 01/27/2023 Discharge date: 02/02/2023  Admitted From: Home Disposition:  CIR  Recommendations for Outpatient Follow-up:  Follow up with PCP in 1-2 weeks Follow up with vascular surgery 2 weeks Follow up with neurology 6-8 weeks  Home Health:No Equipment/Devices:None   Discharge Condition:Stable  CODE STATUS:FULL  Diet recommendation: Reg  Brief/Interim Summary:  82 y.o. female with medical history significant for type 2 diabetes mellitus, hypertension, dyslipidemia, coronary artery disease status post PCI and stent, diastolic dysfunction, and anxiety, who presented to the emergency room with acute onset of altered mental status with confusion after waking up from a nap at 3 PM.  She started her nap at her baseline around 1 PM.  She was having slurred speech and expressive aphasia as well as right arm weakness and neglect.  She just had a right hip surgery on 7/1.  She denies any paresthesias.  No weakness seizures.  No tinnitus or vertigo.  She has been having constipation and low back pain.  She denies any fever or chills.  No chest pain or palpitations.  No cough or wheezing.  No dysuria, oliguria or hematuria or flank pain.    Symptoms had initially improved the emergency room however patient had a deterioration in neurologic function early a.m. on 7/11.  Repeat head CT without significant interval change.  Case discussed with neurology.   Patient acute worsening of neurologic status on 7/12.  Taken emergently for CAT scan.  No hemorrhage.  Follow-up MRI in 7/13 demonstrates possible new areas of infarct.   7/16: Lengthy discussions with patient, family, vascular surgery, neurology.  Plan at this moment is to secure a bed at [facility] inpatient rehab and have patient follow-up with vascular surgery 2 weeks post DC to discuss further intervention for carotid  disease.   Discharge Diagnoses:  Principal Problem:   Embolic stroke [facility]) Active Problems:   Chronic atrial fibrillation with RVR (HCC)   Chronic diastolic CHF (congestive heart failure) (HCC)   Dyslipidemia   Gout   Anxiety and depression   Right arm weakness   Acute CVA (cerebrovascular accident) (HCC)   Bilateral carotid artery stenosis  Acute CVA Bilateral carotid artery stenosis left greater than right Embolic appearing however distribution of infarct is with in the left carotid artery territory.  Raises suspicion for carotid artery stenosis as primary driving factor.  This is not necessarily being considered an Eliquis treatment failure. Echocardiogram reassuring.  Negative bubble Deterioration in neurologic status noted 7/13 Improvement noted 7/14 Plan: Continue apixaban 5 mg daily Continue aspirin 81 mg daily Continue high intensity statin PT OT ST as tolerated   I discussed case with vascular surgery on 7/15.  I also relayed some of my concerns and recommendations to the family.  At this time the patient is moderate to severe risk for perioperative complications with carotid endarterectomy.     Current plan of care would be to discharge patient to CIR once we have insurance authorization.  Initiate rehabilitation/recovery process and have the patient follow-up with vascular surgery in the clinic to discuss endovascular options for carotid stenosis.  Insurance auth obtained.  DC to CIR.  Notified vascular surgery of patient dc   Chronic atrial fibrillation with RVR (HCC) PTA Eliquis and Lopressor   Chronic diastolic CHF (congestive heart failure) (HCC) Continue torsemide and Entresto Continue Lopressor   Anxiety and depression PTA Xanax and Celexa  Gout PTA allopurinol   Dyslipidemia High intensity statin   Functional decline Palliative care evaluation, patient remains full scope of care May benefit from outpatient palliative care referral  Discharge  Instructions  Discharge Instructions     Diet - low sodium heart healthy   Complete by: As directed    Increase activity slowly   Complete by: As directed       Allergies as of 02/02/2023       Reactions   Celebrex [celecoxib] Diarrhea   Penicillins Itching   IgE = 154 (WNL) on 01/05/2023   Relafen [nabumetone]    Palpitation   Lovastatin Diarrhea        Medication List     STOP taking these medications    ALPRAZolam 0.25 MG tablet Commonly known as: XANAX   traMADol 50 MG tablet Commonly known as: ULTRAM       TAKE these medications    allopurinol 300 MG tablet Commonly known as: ZYLOPRIM Take 300 mg by mouth daily.   apixaban 5 MG Tabs tablet Commonly known as: ELIQUIS Take 5 mg by mouth 2 (two) times daily.   aspirin EC 81 MG tablet Take 1 tablet (81 mg total) by mouth daily. Swallow whole. Start taking on: February 03, 2023   atorvastatin 80 MG tablet Commonly known as: LIPITOR Take 1 tablet (80 mg total) by mouth daily.   citalopram 20 MG tablet Commonly known as: CELEXA Take 1 tablet (20 mg total) by mouth daily. Start taking on: February 03, 2023 What changed:  medication strength how much to take   clonazePAM 0.5 MG tablet Commonly known as: KLONOPIN Take 1 tablet (0.5 mg total) by mouth 3 (three) times daily as needed (anxiety).   Entresto 24-26 MG Generic drug: sacubitril-valsartan Take 1 tablet by mouth 2 (two) times daily.   feeding supplement Liqd Take 237 mLs by mouth 2 (two) times daily between meals.   fluticasone 50 MCG/ACT nasal spray Commonly known as: FLONASE Place 1 spray into both nostrils daily. Start taking on: February 03, 2023   melatonin 5 MG Tabs Take 1 tablet (5 mg total) by mouth at bedtime.   metoprolol tartrate 25 MG tablet Commonly known as: LOPRESSOR Take 1 tablet (25 mg total) by mouth 2 (two) times daily.   ondansetron 4 MG disintegrating tablet Commonly known as: ZOFRAN-ODT Take 4 mg by mouth every 8  (eight) hours as needed.   oxyCODONE 5 MG immediate release tablet Commonly known as: Oxy IR/ROXICODONE Take 1 tablet (5 mg total) by mouth every 4 (four) hours as needed for moderate pain (pain score 4-6).   polyethylene glycol 17 g packet Commonly known as: MIRALAX / GLYCOLAX Take 17 g by mouth daily as needed for mild constipation or moderate constipation.   potassium chloride 10 MEQ tablet Commonly known as: KLOR-CON Take 10 mEq by mouth daily.   senna-docusate 8.6-50 MG tablet Commonly known as: Senokot-S Take 1 tablet by mouth at bedtime.   torsemide 20 MG tablet Commonly known as: DEMADEX Take 20 mg by mouth daily.        Allergies  Allergen Reactions   Celebrex [Celecoxib] Diarrhea   Penicillins Itching    IgE = 154 (WNL) on 01/05/2023   Relafen [Nabumetone]     Palpitation    Lovastatin Diarrhea    Consultations: Neurology Palliative care Vascular surgery   Procedures/Studies: MR BRAIN WO CONTRAST  Result Date: 01/29/2023 CLINICAL DATA:  Stroke, follow-up EXAM: MRI HEAD WITHOUT CONTRAST TECHNIQUE: Multiplanar,  multiecho pulse sequences of the brain and surrounding structures were obtained without intravenous contrast. COMPARISON:  01/27/2023 MRI head, correlation is made with 01/29/2023 CT head FINDINGS: Brain: Again noted are scattered foci of restricted diffusion with ADC correlates in the left frontal, parietal, temporal, and occipital cortex and white matter and left caudate; several of these have increased in conspicuity since the prior exam in the vein consistent with acute infarcts. Likely new small acute focus of restricted diffusion with ADC correlate in the left corona radiata (series 9, image 27). No acute hemorrhage, mass, mass effect, or midline shift. No hydrocephalus or extra-axial collection. Confluent T2 hyperintense signal in the periventricular white matter and pons, likely the sequela of moderate to severe chronic small vessel ischemic  disease. Dilated perivascular spaces in the bilateral basal ganglia. Vascular: No seen on the prior CTA, poor visualization of the right V4 flow void. Otherwise normal arterial flow voids. Skull and upper cervical spine: Normal marrow signal. Sinuses/Orbits: Clear paranasal sinuses. No acute finding in the orbits. Status post bilateral lens replacements. Other: The mastoid air cells are well aerated. 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 Otherwise unchanged scattered acute to early subacute infarcts throughout the left cerebral hemisphere. No acute hemorrhage, mass effect, or midline shift. These results will be called to the ordering clinician or representative by the Radiologist Assistant, and communication documented in the PACS or [employer]. Electronically Signed   By: Wiliam Ke M.D.   On: 01/29/2023 23:58   CT ANGIO HEAD NECK W WO CM (CODE STROKE)  Result Date: 01/29/2023 CLINICAL DATA:  Neuro deficit, stroke suspected. Abnormal MRI with left-sided infarcts. EXAM: CT ANGIOGRAPHY HEAD AND NECK WITH AND WITHOUT CONTRAST TECHNIQUE: Multidetector CT imaging of the head and neck was performed using the standard protocol during bolus administration of intravenous contrast. Multiplanar CT image reconstructions and MIPs were obtained to evaluate the vascular anatomy. Carotid stenosis measurements (when applicable) are obtained utilizing NASCET criteria, using the distal internal carotid diameter as the denominator. RADIATION DOSE REDUCTION: This exam was performed according to the departmental dose-optimization program which includes automated exposure control, adjustment of the mA and/or kV according to patient size and/or use of iterative reconstruction technique. CONTRAST:  75mL OMNIPAQUE IOHEXOL 350 MG/ML SOLN COMPARISON:  MR head without contrast 01/27/2023. CT head without contrast 01/28/2023 and CT head without contrast 01/29/2023. FINDINGS: CTA NECK FINDINGS Aortic arch:  Atherosclerotic calcifications are again noted at the aortic arch and great vessel origins. No stenosis or aneurysm is present. Right carotid system: The right common carotid artery is within normal limits. Mural calcifications are stable. The high-grade stenosis of greater than 80% is present at the proximal right ICA. Previous study underestimated the degree of stenosis. No significant interval change is present. Left carotid system: The left common carotid artery demonstrates extensive mural calcification without proximal stenosis. A high-grade, near occlusive stenosis is present in the proximal left ICA. More distal left ICA is slightly smaller in caliber than the right. Vertebral arteries: The vertebral arteries both originate from the subclavian arteries. The left vertebral artery is dominant. A 50% stenosis present at its origin. The right vertebral artery is hypoplastic. It is occluded in the V3 segment is proximal to the dura. No significant stenosis is present on the left. Skeleton: Multilevel degenerative changes are again noted within the cervical spine. No focal osseous lesions are present. Other neck: Soft tissues the neck are otherwise unremarkable. Salivary glands are within normal limits. Thyroid is  normal. No significant adenopathy is present. No focal mucosal or submucosal lesions are present. Upper chest: Small bilateral pleural effusions are present. Mild dependent atelectasis is present. No nodule or mass lesion is present. No significant airspace disease is present otherwise. Review of the MIP images confirms the above findings CTA HEAD FINDINGS Anterior circulation: Atherosclerotic calcifications are again noted within the cavernous internal carotid arteries bilaterally. No significant stenosis or interval change is present. The A1 and M1 segments are normal bilaterally. The anterior communicating artery is patent. MCA bifurcations within normal limits bilaterally. The ACA and MCA branch  vessels are within normal limits. No aneurysm is present. Posterior circulation: Left vertebral artery is scratched at atherosclerotic changes are present at the dural margin of the left vertebral artery without a significant stenosis. Left PICA origin is visualized and normal. The right V4 segment fills to the PICA, likely in retrograde fashion. The basilar artery is normal. Both superior cerebellar arteries are patent. The left posterior cerebral artery originates from basilar tip. The right posterior cerebral artery is of fetal type with a small right P1 segment. A proximal right P2 segment stenosis is again noted. In mid left P2 segment stenosis is present. PCA branch vessels are otherwise unremarkable and stable. Venous sinuses: The dural sinuses are patent. The straight sinus and deep cerebral veins are intact. Cortical veins are within normal limits. No significant vascular malformation is evident. Anatomic variants: Fetal type right posterior cerebral artery. Review of the MIP images confirms the above findings IMPRESSION: 1. 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 2. 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 3. Stable 50% stenosis at the origin of the dominant left vertebral artery. 4. 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 5. Stable proximal right P2 segment stenosis. 6. Stable mid left P2 segment stenosis. 7. Stable atherosclerotic changes within the cavernous internal carotid arteries bilaterally without significant stenosis. 8. No significant proximal stenosis, aneurysm, or branch vessel occlusion within the Circle of Willis. 9.  Aortic Atherosclerosis (ICD10-I70.0). These results were called by telephone at the time of interpretation on 01/29/2023 at 4:31 Pm to provider MCNEILL [facility] , who verbally acknowledged these results. The final report was delayed by technical issues.  Electronically Signed   By: Marin Roberts M.D.   On: 01/29/2023 17:50   CT HEAD CODE STROKE WO CONTRAST  Result Date: 01/29/2023 CLINICAL DATA:  Code stroke.  Neuro deficit, acute, stroke suspected EXAM: CT HEAD WITHOUT CONTRAST TECHNIQUE: Contiguous axial images were obtained from the base of the skull through the vertex without intravenous contrast. RADIATION DOSE REDUCTION: This exam was performed according to the departmental dose-optimization program which includes automated exposure control, adjustment of the mA and/or kV according to patient size and/or use of iterative reconstruction technique. COMPARISON:  None Available. FINDINGS: Brain: No evidence of acute infarction, hemorrhage, hydrocephalus, extra-axial collection or mass lesion/mass effect. Patchy white matter hypodensities, nonspecific but compatible with chronic microvascular ischemic disease. Vascular: No hyperdense vessel. Skull: No acute fracture. Sinuses/Orbits: Clear sinuses.  No acute orbital findings. Other: No mastoid effusions. ASPECTS [facility] Stroke Program Early CT Score) total score (0-10 with 10 being normal): 10. IMPRESSION: 1. No evidence of acute intracranial abnormality. 2. ASPECTS is 10. Code stroke imaging results were communicated on 01/29/2023 at 3:58 pm to provider Dr. Amada Jupiter via secure text paging. Electronically Signed   By: Feliberto Harts M.D.   On: 01/29/2023 15:58  ECHOCARDIOGRAM COMPLETE BUBBLE STUDY  Result Date: 01/28/2023    ECHOCARDIOGRAM REPORT   Patient Name:   Brooke Davis [facility] Date of Exam: 01/28/2023 Medical Rec #:  [id number]        Height:       62.0 in Accession #:    [id number]       Weight:       193.1 lb Date of Birth:  [birth year]        BSA:          1.883 m Patient Age:    81 years         BP:           112/61 mmHg Patient Gender: F                HR:           65 bpm. Exam Location:  [facility] Procedure: 2D Echo, Cardiac Doppler, Color Doppler and Saline Contrast Bubble            Study  Indications:     TIA 435.9 / G45.9  History:         Patient has prior history of Echocardiogram examinations, most                  recent 01/14/2021. Arrythmias:Atrial Fibrillation; Risk                  Factors:Hypertension.  Sonographer:     Cristela Blue Referring Phys:  [id number] JAN A MANSY Diagnosing Phys: Julien Nordmann MD  Sonographer Comments: Suboptimal apical window. IMPRESSIONS  1. Left ventricular ejection fraction, by estimation, is 60 to 65%. The left ventricle has normal function. The left ventricle has no regional wall motion abnormalities. Left ventricular diastolic parameters are indeterminate.  2. Right ventricular systolic function is normal. The right ventricular size is normal. There is normal pulmonary artery systolic pressure. The estimated right ventricular systolic pressure is 26.3 mmHg.  3. Left atrial size was moderately dilated.  4. Right atrial size was mildly dilated.  5. The mitral valve is normal in structure. Mild mitral valve regurgitation. No evidence of mitral stenosis.  6. Tricuspid valve regurgitation is mild to moderate.  7. The aortic valve is normal in structure. Aortic valve regurgitation is not visualized. Aortic valve sclerosis is present, with no evidence of aortic valve stenosis.  8. The inferior vena cava is normal in size with greater than 50% respiratory variability, suggesting right atrial pressure of 3 mmHg.  9. Agitated saline contrast bubble study was negative, with no evidence of any interatrial shunt. FINDINGS  Left Ventricle: Left ventricular ejection fraction, by estimation, is 60 to 65%. The left ventricle has normal function. The left ventricle has no regional wall motion abnormalities. The left ventricular internal cavity size was normal in size. There is  no left ventricular hypertrophy. Left ventricular diastolic parameters are indeterminate. Right Ventricle: The right ventricular size is normal. No increase in right ventricular wall thickness. Right  ventricular systolic function is normal. There is normal pulmonary artery systolic pressure. The tricuspid regurgitant velocity is 2.31 m/s, and  with an assumed right atrial pressure of 5 mmHg, the estimated right ventricular systolic pressure is 26.3 mmHg. Left Atrium: Left atrial size was moderately dilated. Right Atrium: Right atrial size was mildly dilated. Pericardium: There is no evidence of pericardial effusion. Mitral Valve: The mitral valve is normal in structure. There is mild calcification of the mitral valve leaflet(s). Mild mitral annular calcification. Mild  mitral valve regurgitation. No evidence of mitral valve stenosis. Tricuspid Valve: The tricuspid valve is normal in structure. Tricuspid valve regurgitation is mild to moderate. No evidence of tricuspid stenosis. Aortic Valve: The aortic valve is normal in structure. Aortic valve regurgitation is not visualized. Aortic valve sclerosis is present, with no evidence of aortic valve stenosis. Aortic valve mean gradient measures 8.3 mmHg. Aortic valve peak gradient measures 15.6 mmHg. Aortic valve area, by VTI measures 1.66 cm. Pulmonic Valve: The pulmonic valve was normal in structure. Pulmonic valve regurgitation is not visualized. No evidence of pulmonic stenosis. Aorta: The aortic root is normal in size and structure. Venous: The inferior vena cava is normal in size with greater than 50% respiratory variability, suggesting right atrial pressure of 3 mmHg. IAS/Shunts: No atrial level shunt detected by color flow Doppler. Agitated saline contrast was given intravenously to evaluate for intracardiac shunting. Agitated saline contrast bubble study was negative, with no evidence of any interatrial shunt. There  is no evidence of a patent foramen ovale. There is no evidence of an atrial septal defect.  LEFT VENTRICLE PLAX 2D LVIDd:         3.90 cm   Diastology LVIDs:         2.50 cm   LV e' medial:    8.16 cm/s LV PW:         0.80 cm   LV E/e' medial:   13.7 LV IVS:        1.40 cm   LV e' lateral:   11.10 cm/s LVOT diam:     2.00 cm   LV E/e' lateral: 10.1 LV SV:         67 LV SV Index:   36 LVOT Area:     3.14 cm  RIGHT VENTRICLE RV Basal diam:  3.70 cm RV Mid diam:    3.20 cm RV S prime:     13.10 cm/s TAPSE (M-mode): 1.3 cm LEFT ATRIUM             Index        RIGHT ATRIUM           Index LA diam:        4.10 cm 2.18 cm/m   RA Area:     22.60 cm LA Vol (A2C):   68.3 ml 36.26 ml/m  RA Volume:   74.90 ml  39.77 ml/m LA Vol (A4C):   82.3 ml 43.70 ml/m LA Biplane Vol: 80.7 ml 42.85 ml/m  AORTIC VALVE AV Area (Vmax):    1.49 cm AV Area (Vmean):   1.57 cm AV Area (VTI):     1.66 cm AV Vmax:           197.33 cm/s AV Vmean:          133.667 cm/s AV VTI:            0.406 m AV Peak Grad:      15.6 mmHg AV Mean Grad:      8.3 mmHg LVOT Vmax:         93.40 cm/s LVOT Vmean:        67.000 cm/s LVOT VTI:          0.214 m LVOT/AV VTI ratio: 0.53  AORTA Ao Root diam: 3.30 cm MITRAL VALVE                TRICUSPID VALVE MV Area (PHT): 4.26 cm     TR Peak grad:   21.3 mmHg MV Decel  Time: 178 msec     TR Vmax:        231.00 cm/s MV E velocity: 112.00 cm/s                             SHUNTS                             Systemic VTI:  0.21 m                             Systemic Diam: 2.00 cm Julien Nordmann MD Electronically signed by Julien Nordmann MD Signature Date/Time: 01/28/2023/12:52:11 PM    Final    CT HEAD CODE STROKE WO CONTRAST`  Addendum Date: 01/28/2023   ADDENDUM REPORT: 01/28/2023 06:48 ADDENDUM: Study discussed by telephone with Dr. Valente David on 01/28/2023 at 0642 hours. Electronically Signed   By: Odessa Fleming M.D.   On: 01/28/2023 06:48   Result Date: 01/28/2023 CLINICAL DATA:  Code stroke. 82 year old female with weakness and altered mental status, change in stroke symptoms. Numerous scattered small foci of ischemia in the left hemisphere on MRI yesterday. EXAM: CT HEAD WITHOUT CONTRAST TECHNIQUE: Contiguous axial images were obtained from the base of the  skull through the vertex without intravenous contrast. RADIATION DOSE REDUCTION: This exam was performed according to the departmental dose-optimization program which includes automated exposure control, adjustment of the mA and/or kV according to patient size and/or use of iterative reconstruction technique. COMPARISON:  Brain MRI 2317 hours yesterday. Head CT 1859 hours yesterday. FINDINGS: Brain: No acute intracranial hemorrhage identified. No midline shift, mass effect, or evidence of intracranial mass lesion. Stable ventricle size and configuration. Patchy bilateral white matter hypodensity is stable. The multiple small foci of abnormal diffusion scattered in the left hemisphere on MRI last night are largely occult by CT. Stable gray-white matter differentiation throughout the brain. No new cortically based infarct identified. Vascular: No suspicious intracranial vascular hyperdensity. Calcified atherosclerosis at the skull base. Skull: No acute osseous abnormality identified. Mild motion artifact. Sinuses/Orbits: Visualized paranasal sinuses and mastoids are stable and well aerated. Other: No gaze deviation, acute orbit or scalp soft tissue finding. ASPECTS [facility] Stroke Program Early CT Score) Total score (0-10 with 10 being normal): 10 IMPRESSION: Stable noncontrast CT appearance of the brain since yesterday, the multiple small infarcts by MRI are largely occult by CT. No new intracranial abnormality. Electronically Signed: By: Odessa Fleming M.D. On: 01/28/2023 06:37   MR BRAIN WO CONTRAST  Result Date: 01/28/2023 CLINICAL DATA: Confusion, right upper extremity weakness EXAM: MRI HEAD WITHOUT CONTRAST TECHNIQUE: Multiplanar, multiecho pulse sequences of the brain and surrounding structures were obtained without intravenous contrast. COMPARISON:  No prior MRI available, correlation is made with 01/27/2023 CT head and CTA head and neck FINDINGS: Evaluation is significantly limited by motion, with the exception  of the diffusion-weighted sequence. Brain: Scattered foci of cortical and white matter restricted diffusion with ADC correlates in the left cerebral hemisphere and left caudate (series 5, images 20-38). Evaluation for T2 hyperintense correlate is limited by motion artifact No definite acute hemorrhage, mass, mass effect, or midline shift. No hydrocephalus or extra-axial collection. Vascular: As seen on the prior CTA, the right V4 flow void is not well seen. Otherwise normal arterial flow voids. Skull and upper cervical spine: Grossly normal marrow signal. Sinuses/Orbits: Clear paranasal sinuses. Status post  bilateral lens replacements. Other: The mastoid air cells are well aerated. IMPRESSION: Evaluation is significantly limited by motion, with the exception of the diffusion-weighted sequence. 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 These results were called by telephone at the time of interpretation on 01/28/2023 at 1:05 am to provider [facility] , who verbally acknowledged these results. Electronically Signed   By: Wiliam Ke M.D.   On: 01/28/2023 01:05   CT ANGIO HEAD NECK W WO CM  Result Date: 01/27/2023 CLINICAL DATA:  Confusion and right upper extremity weakness EXAM: CT ANGIOGRAPHY HEAD AND NECK WITH AND WITHOUT CONTRAST TECHNIQUE: Multidetector CT imaging of the head and neck was performed using the standard protocol during bolus administration of intravenous contrast. Multiplanar CT image reconstructions and MIPs were obtained to evaluate the vascular anatomy. Carotid stenosis measurements (when applicable) are obtained utilizing NASCET criteria, using the distal internal carotid diameter as the denominator. RADIATION DOSE REDUCTION: This exam was performed according to the departmental dose-optimization program which includes automated exposure control, adjustment of the mA and/or kV according to patient size and/or use of  iterative reconstruction technique. CONTRAST:  75mL OMNIPAQUE IOHEXOL 350 MG/ML SOLN COMPARISON:  No prior CTA available, correlation is made with CT head 01/27/2023 FINDINGS: CT HEAD FINDINGS For noncontrast findings, please see same day CT head. CTA NECK FINDINGS Aortic arch: Evaluation of the arch is somewhat limited by motion. Within this limitation, Two-vessel arch with a common origin of the brachiocephalic and left common carotid arteries. Imaged portion shows no evidence of aneurysm or dissection. No significant stenosis of the major arch vessel origins. Mild aortic atherosclerosis. Right carotid system: 60% stenosis in the proximal right ICA (series 6, image 194). Additional atherosclerotic in the right common carotid artery is not hemodynamically significant. Left carotid system: 70% stenosis in the proximal left ICA (series 6, image 199). Additional atherosclerotic disease in the common carotid artery is not hemodynamically significant. Vertebral arteries: Left dominant system. The left vertebral artery is patent from its origin to the skull base without significant stenosis or evidence of dissection. The right vertebral artery is quite diminutive, and poorly perfused in the distal V3 segment (series 6, image 159), likely reflecting moderate stenosis. Skeleton: No acute osseous abnormality. Degenerative changes in the cervical spine. Other neck: Hypoenhancing and calcified nodules in the thyroid, the largest of which measures up to 6 mm, for which no follow-up is currently indicated. (Reference: J Am Coll Radiol. 2015 Feb;12(2): 143-50) Upper chest: No focal pulmonary opacity or pleural effusion. Mild centrilobular emphysema. Review of the MIP images confirms the above findings CTA HEAD FINDINGS Anterior circulation: Both internal carotid arteries are patent to the termini, with mild stenosis in the bilateral cavernous and supraclinoid segments. A1 segments patent. Normal anterior communicating artery.  Anterior cerebral arteries are patent to their distal aspects without significant stenosis. No M1 stenosis or occlusion. MCA branches are irregular but perfused to their distal aspects without significant stenosis. Posterior circulation: Nonopacification of the proximal right V4 (series 6, image 146), consistent with occlusion. The more distal right V4 is opacified, likely retrograde. The left vertebral artery is patent to the vertebrobasilar junction with mild stenosis in the proximal V4 (series 6, image 141). Posteroinferior cerebellar arteries are not definitively visualized. Basilar patent to its distal aspect without significant stenosis. Superior cerebellar arteries patent proximally. Patent P1 segments. Mild stenosis in the proximal right P2 (series 6, image 99), and mid left  P 2 (series 6, image 105). The remainder of the PCAs are irregular but do not demonstrate significant stenosis. The right posterior communicating artery is patent. The left is not definitively seen. Venous sinuses: As permitted by contrast timing, patent. Anatomic variants: None significant. Review of the MIP images confirms the above findings IMPRESSION: 1. 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 This is of indeterminate acuity. 2. 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 3. Mild stenosis in the bilateral cavernous and supraclinoid ICAs. 4. Mild stenosis in the proximal right P2 and mid left P2. 5. Aortic atherosclerosis. 6. Emphysema. Aortic Atherosclerosis (ICD10-I70.0) and Emphysema (ICD10-J43.9). These results were called by telephone at the time of interpretation on 01/27/2023 at 10:07 pm to provider [facility] , who verbally acknowledged these results. Electronically Signed   By: Wiliam Ke M.D.   On: 01/27/2023 22:07   CT Head Wo Contrast  Result Date: 01/27/2023 CLINICAL DATA:  Confusion. EXAM: CT HEAD WITHOUT CONTRAST  TECHNIQUE: Contiguous axial images were obtained from the base of the skull through the vertex without intravenous contrast. RADIATION DOSE REDUCTION: This exam was performed according to the departmental dose-optimization program which includes automated exposure control, adjustment of the mA and/or kV according to patient size and/or use of iterative reconstruction technique. COMPARISON:  Head CT dated 10/03/2021. FINDINGS: Brain: Mild age-related atrophy and moderate chronic microvascular ischemic changes. There is no acute intracranial hemorrhage. No mass effect or midline shift. No extra-axial fluid collection. Vascular: No hyperdense vessel or unexpected calcification. Skull: Normal. Negative for fracture or focal lesion. Sinuses/Orbits: No acute finding. Other: None IMPRESSION: 1. No acute intracranial pathology. 2. Mild age-related atrophy and moderate chronic microvascular ischemic changes. Electronically Signed   By: Elgie Collard M.D.   On: 01/27/2023 19:14   DG HIP UNILAT W OR W/O PELVIS 2-3 VIEWS RIGHT  Result Date: 01/18/2023 CLINICAL DATA:  Status post right hip arthroplasty EXAM: DG HIP (WITH OR WITHOUT PELVIS) 2-3V RIGHT COMPARISON:  None Available. FINDINGS: There is recent right hip arthroplasty. No fracture is seen. Surgical drain is noted in place. There are pockets of air and skin staples from recent surgery. IMPRESSION: Status post right hip arthroplasty. Electronically Signed   By: Ernie Avena M.D.   On: 01/18/2023 12:46      Subjective: Seen and examined on day of dc.  Stable, appropriate for dc to CIR  Discharge Exam: Vitals:   02/02/23 0517 02/02/23 0746  BP: (!) 142/56 (!) 146/58  Pulse: 67 74  Resp: 16 16  Temp: 98 F (36.7 C) 98 F (36.7 C)  SpO2: 98% 100%   Vitals:   02/01/23 2051 02/02/23 0206 02/02/23 0517 02/02/23 0746  BP: (!) 132/57 (!) 114/52 (!) 142/56 (!) 146/58  Pulse: 71 79 67 74  Resp: 20  16 16   Temp: 98.9 F (37.2 C) 98.6 F (37 C) 98  F (36.7 C) 98 F (36.7 C)  TempSrc: Oral Oral    SpO2: 98% 96% 98% 100%  Weight:      Height:        General: Pt is alert, awake, not in acute distress Cardiovascular: RRR, S1/S2 +, no rubs, no gallops Respiratory: CTA bilaterally, no wheezing, no rhonchi Abdominal: Soft, NT, ND, bowel sounds + Extremities: no edema, no cyanosis    The results of significant diagnostics from this hospitalization (including imaging, microbiology, ancillary and laboratory) are listed  below for reference.     Microbiology: No results found for this or any previous visit (from the past 240 hour(s)).   Labs: BNP (last 3 results) Recent Labs    06/25/22 1455  BNP 433.9*   Basic Metabolic Panel: Recent Labs  Lab 01/27/23 1827 01/28/23 0400  NA 134* 136  K 4.5 4.5  CL 100 104  CO2 25 27  GLUCOSE 123* 124*  BUN 15 14  CREATININE 0.99 1.03*  CALCIUM 8.8* 8.7*   Liver Function Tests: Recent Labs  Lab 01/27/23 1827  AST 23  ALT 18  ALKPHOS 98  BILITOT 0.7  PROT 6.3*  ALBUMIN 2.7*   Recent Labs  Lab 01/27/23 1827  LIPASE 34   No results for input(s): "AMMONIA" in the last 168 hours. CBC: Recent Labs  Lab 01/27/23 1827 01/28/23 0400  WBC 8.3 7.7  HGB 8.8* 8.6*  HCT 28.1* 27.0*  MCV 101.1* 101.1*  PLT 311 295   Cardiac Enzymes: No results for input(s): "CKTOTAL", "CKMB", "CKMBINDEX", "TROPONINI" in the last 168 hours. BNP: Invalid input(s): "POCBNP" CBG: Recent Labs  Lab 01/29/23 1205 01/29/23 1607 01/30/23 0821 01/30/23 1241 01/30/23 1620  GLUCAP 168* 140* 117* 133* 143*   D-Dimer No results for input(s): "DDIMER" in the last 72 hours. Hgb A1c No results for input(s): "HGBA1C" in the last 72 hours. Lipid Profile No results for input(s): "CHOL", "HDL", "LDLCALC", "TRIG", "CHOLHDL", "LDLDIRECT" in the last 72 hours. Thyroid function studies No results for input(s): "TSH", "T4TOTAL", "T3FREE", "THYROIDAB" in the last 72 hours.  Invalid input(s):  "FREET3" Anemia work up No results for input(s): "VITAMINB12", "FOLATE", "FERRITIN", "TIBC", "IRON", "RETICCTPCT" in the last 72 hours. Urinalysis    Component Value Date/Time   COLORURINE YELLOW (A) 01/05/2023 1049   APPEARANCEUR HAZY (A) 01/05/2023 1049   LABSPEC 1.013 01/05/2023 1049   PHURINE 7.0 01/05/2023 1049   GLUCOSEU NEGATIVE 01/05/2023 1049   HGBUR NEGATIVE 01/05/2023 1049   BILIRUBINUR NEGATIVE 01/05/2023 1049   KETONESUR NEGATIVE 01/05/2023 1049   PROTEINUR NEGATIVE 01/05/2023 1049   NITRITE NEGATIVE 01/05/2023 1049   LEUKOCYTESUR TRACE (A) 01/05/2023 1049   Sepsis Labs Recent Labs  Lab 01/27/23 1827 01/28/23 0400  WBC 8.3 7.7   Microbiology No results found for this or any previous visit (from the past 240 hour(s)).   Time coordinating discharge: Over 30 minutes  SIGNED:   Tresa Moore, MD  [facility] 02/02/2023, 12:30 PM Pager   If 7PM-7AM, please contact night-coverage

## 2023-02-02 NOTE — Progress Notes (Signed)
Inpatient Rehabilitation Center Individual Statement of Services  Patient Name:  Brooke Davis  Date:  02/02/2023  Welcome to the Inpatient Rehabilitation Center.  Our goal is to provide you with an individualized program based on your diagnosis and situation, designed to meet your specific needs.  With this comprehensive rehabilitation program, you will be expected to participate in at least 3 hours of rehabilitation therapies Monday-Friday, with modified therapy programming on the weekends.  Your rehabilitation program will include the following services:  Physical Therapy (PT), Occupational Therapy (OT), Speech Therapy (ST), 24 hour per day rehabilitation nursing, Therapeutic Recreaction (TR), Neuropsychology, Care Coordinator, Rehabilitation Medicine, Nutrition Services, Pharmacy Services, and Other  Weekly team conferences will be held on Wednesdays to discuss your progress.  Your Inpatient Rehabilitation Care Coordinator will talk with you frequently to get your input and to update you on team discussions.  Team conferences with you and your family in attendance may also be held.  Expected length of stay: 7-10 Days  Overall anticipated outcome: Supervision-Min A   Depending on your progress and recovery, your program may change. Your Inpatient Rehabilitation Care Coordinator will coordinate services and will keep you informed of any changes. Your Inpatient Rehabilitation Care Coordinator's name and contact numbers are listed  below.  The following services may also be recommended but are not provided by the Inpatient Rehabilitation Center:   Home Health Rehabiltiation Services Outpatient Rehabilitation Services    Arrangements will be made to provide these services after discharge if needed.  Arrangements include referral to agencies that provide these services.  Your insurance has been verified to be:   [facility] Medicare Your primary doctor is:  [name], MD  Pertinent  information will be shared with your doctor and your insurance company.  Inpatient Rehabilitation Care Coordinator:  Lavera Guise, [place] [phone] or [phone]  Information discussed with and copy given to patient by: Andria Rhein, 02/02/2023, 2:01 PM

## 2023-02-02 NOTE — TOC Transition Note (Signed)
Transition of Care [facility]) - CM/SW Discharge Note   Patient Details  Name: Brooke Davis MRN: [id number] Date of Birth: April 30, 1941  [facility] CM/SW Contact:  Allena Katz, LCSW Phone Number: 02/02/2023, 1:05 PM   Clinical Narrative:   Pt discharging to [facility] inpatient rehab, medical necessity printed to unit. CSW signing off.     Final next level of care: IP Rehab Facility Barriers to Discharge: Barriers Resolved   Patient Goals and CMS Choice CMS Medicare.gov Compare Post Acute Care list provided to:: Patient Choice offered to / list presented to : Patient  Discharge Placement                [facility] Patient to be transferred to facility by: [facility]   Patient and family notified of of transfer: 02/02/23  Discharge Plan and Services Additional resources added to the After Visit Summary for                                       Social Determinants of Health (SDOH) Interventions SDOH Screenings   Food Insecurity: No Food Insecurity (01/28/2023)  Housing: Low Risk  (01/28/2023)  Transportation Needs: No Transportation Needs (01/28/2023)  Utilities: Not At Risk (01/28/2023)  Financial Resource Strain: Patient Declined (11/05/2022)   Received from [facility]  Tobacco Use: Medium Risk (01/28/2023)     Readmission Risk Interventions     No data to display

## 2023-02-03 DIAGNOSIS — I639 Cerebral infarction, unspecified: Secondary | ICD-10-CM | POA: Diagnosis not present

## 2023-02-03 LAB — COMPREHENSIVE METABOLIC PANEL
ALT: 25 U/L (ref 0–44)
AST: 21 U/L (ref 15–41)
Albumin: 2.2 g/dL — ABNORMAL LOW (ref 3.5–5.0)
Alkaline Phosphatase: 93 U/L (ref 38–126)
Anion gap: 7 (ref 5–15)
BUN: 22 mg/dL (ref 8–23)
CO2: 28 mmol/L (ref 22–32)
Calcium: 8.6 mg/dL — ABNORMAL LOW (ref 8.9–10.3)
Chloride: 99 mmol/L (ref 98–111)
Creatinine, Ser: 1.23 mg/dL — ABNORMAL HIGH (ref 0.44–1.00)
GFR, Estimated: 45 mL/min — ABNORMAL LOW (ref >60)
Glucose, Bld: 142 mg/dL — ABNORMAL HIGH (ref 70–99)
Potassium: 3.6 mmol/L (ref 3.5–5.1)
Sodium: 134 mmol/L — ABNORMAL LOW (ref 135–145)
Total Bilirubin: 0.6 mg/dL (ref 0.3–1.2)
Total Protein: 5.8 g/dL — ABNORMAL LOW (ref 6.5–8.1)

## 2023-02-03 LAB — URINE CULTURE: Culture: <10000 — AB

## 2023-02-03 LAB — CBC WITH DIFFERENTIAL/PLATELET
Abs Immature Granulocytes: 0.05 10*3/uL (ref 0.00–0.07)
Basophils Absolute: 0 10*3/uL (ref 0.0–0.1)
Basophils Relative: 0 %
Eosinophils Absolute: 0.1 10*3/uL (ref 0.0–0.5)
Eosinophils Relative: 1 %
HCT: 25.3 % — ABNORMAL LOW (ref 36.0–46.0)
Hemoglobin: 8.2 g/dL — ABNORMAL LOW (ref 12.0–15.0)
Immature Granulocytes: 1 %
Lymphocytes Relative: 12 %
Lymphs Abs: 1.2 10*3/uL (ref 0.7–4.0)
MCH: 31.2 pg (ref 26.0–34.0)
MCHC: 32.4 g/dL (ref 30.0–36.0)
MCV: 96.2 fL (ref 80.0–100.0)
Monocytes Absolute: 0.6 10*3/uL (ref 0.1–1.0)
Monocytes Relative: 6 %
Neutro Abs: 7.9 10*3/uL — ABNORMAL HIGH (ref 1.7–7.7)
Neutrophils Relative %: 80 %
Platelets: 366 10*3/uL (ref 150–400)
RBC: 2.63 MIL/uL — ABNORMAL LOW (ref 3.87–5.11)
RDW: 14.2 % (ref 11.5–15.5)
WBC: 9.9 10*3/uL (ref 4.0–10.5)
nRBC: 0 % (ref 0.0–0.2)

## 2023-02-03 NOTE — Progress Notes (Signed)
 Inpatient Rehabilitation  Patient information reviewed and entered into eRehab system by Melissa M. Bowie, M.A., CCC/SLP, PPS Coordinator.  Information including medical coding, functional ability and quality indicators will be reviewed and updated through discharge.    

## 2023-02-03 NOTE — Evaluation (Addendum)
Occupational Therapy Assessment and Plan  Patient Details  Name: Brooke Davis MRN: [id number] Date of Birth: [birth year]  OT Diagnosis: acute pain, muscle weakness (generalized), pain in joint, and right sided hemiparesis Rehab Potential: Rehab Potential (ACUTE ONLY): Good ELOS: 7-10 days   Today's Date: 02/03/2023 OT Individual Time: 4259-5638 OT Individual Time Calculation (min): 57 min     Hospital Problem: Principal Problem:   Embolic cerebral infarction [facility])   Past Medical History:  Past Medical History:  Diagnosis Date   Anxiety    a.) on BZO (alprazolam) PRN   Arthritis    fingers, right hip   Atrial fibrillation (HCC) 11/10/2013   a.) CHA2DS2VASc = 5 (age x2, sex, HTN, T2DM);  b.) rate/rhythm maintained on oral metoprolol tartrate; chronically anticoagulated with apixaban   Bronchitis    Cerebrovascular small vessel disease (chronic)    Coronary artery disease involving native coronary artery of native heart 01/11/2021   a.) LHC 01/11/2021: 40% oLM, 50% mLCx, 30% oLCx, 30% pLAD, 30% mLAD, 99% pRCA (2.75 x 30 mm Resolute Onyx DES), 75% p-mRCA, 60% dRCA   DDD (degenerative disc disease), lumbar 12/01/2013   Diastolic dysfunction 01/12/2021   a.) TTE 01/12/2021 (s/p inf STEMI): EF 55-60%, mild-mod LAE, mild RAE, mod RVE, mild AR/MR, mod TR, G1DD   Diet-controlled type 2 diabetes mellitus (HCC)    Full dentures    Gout    History of bilateral cataract extraction 2021   Hyperlipidemia    Hypertension, essential    Long term current use of anticoagulant    a.) apixaban   PONV (postoperative nausea and vomiting)    Sinoatrial node dysfunction (HCC) 11/10/2013   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) 01/11/2021   a.) LHC/PCI 01/11/2021 --> culprit lesion 99% pRCA --> 2.75 x 30 mm Resolute Onyx DES x 1   Varicose veins of legs    Wears hearing aid in both ears    Past Surgical History:  Past Surgical History:  Procedure Laterality Date    BREAST BIOPSY Left 2011   NEG   CARDIAC CATHETERIZATION     over 20 yrs ago.  All OK.   CATARACT EXTRACTION W/PHACO Left 03/27/2020   Procedure: CATARACT EXTRACTION PHACO AND INTRAOCULAR LENS PLACEMENT (IOC) LEFT 7.49  00:56.4  13.3%;  Surgeon: Lockie Mola, MD;  Location: [facility];  Service: Ophthalmology;  Laterality: Left;   CATARACT EXTRACTION W/PHACO Right 04/24/2020   Procedure: CATARACT EXTRACTION PHACO AND INTRAOCULAR LENS PLACEMENT (IOC) RIGHT;  Surgeon: Lockie Mola, MD;  Location: [facility];  Service: Ophthalmology;  Laterality: Right;  8.34 0:50.2 16.6%   CORONARY/GRAFT ACUTE MI REVASCULARIZATION N/A 01/11/2021   Procedure: Coronary/Graft Acute MI Revascularization;  Surgeon: Marcina Millard, MD;  Location: [facility] INVASIVE CV [facility];  Service: Cardiovascular;  Laterality: N/A;   LEFT HEART CATH AND CORONARY ANGIOGRAPHY N/A 01/11/2021   Procedure: LEFT HEART CATH AND CORONARY ANGIOGRAPHY;  Surgeon: Marcina Millard, MD;  Location: [facility] INVASIVE CV LAB;  Service: Cardiovascular;  Laterality: N/A;   PARTIAL HYSTERECTOMY     TOE SURGERY Left    great toe; rod in place   TOTAL HIP ARTHROPLASTY Right 01/18/2023   Procedure: TOTAL HIP ARTHROPLASTY;  Surgeon: Donato Heinz, MD;  Location: [facility] OR[facility];  Service: Orthopedics;  Laterality: Right;   TOTAL KNEE ARTHROPLASTY Right 2007    Assessment & Plan Clinical Impression: Patient is a 82 year old right-handed female with history of type 2 diabetes mellitus, hypertension, hyperlipidemia, atrial fibrillation maintained on  Eliquis followed by cardiology Dr.Shaukat Welton Flakes, 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 Francesco Sor, CAD status post PCI and stenting, diastolic congestive heart failure and anxiety. Per chart review patient modified independent with rollator prior to admission. 1 level home with ramped entrance. She lives with family and assistance as needed. [facility] with  speech difficulty. It was noted patient had been convalescing at home after recent hip surgery she had held her Eliquis for 3 days prior to that surgery. Cranial CT scan negative for acute changes. MRI limited by motion showing scattered foci of acute infarct in the left cerebral hemisphere and the left caudate. 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 50% stenosis of the origin of the dominant left vertebral artery. 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 No significant proximal stenosis aneurysm or branch vessel occlusion within the circle of Willis. Patient did not receive TNK. Admission chemistries unremarkable except sodium 134 glucose 123, hemoglobin 8.8. Echo with ejection fraction of 60 to 65% no wall motion abnormalities. Neurology follow-up patient currently remains on Eliquis as prior to admission with the addition of low-dose aspirin. She is tolerating a regular consistency diet. Vascular surgery was consulted in regards to bilateral carotid artery stenosis and plan outpatient follow-up. Palliative care was consulted to establish goals of care. Therapies were initiated. Patient continues to be weightbearing right lower extremity after recent right total hip arthroplasty.  Patient transferred to CIR on 02/02/2023 .    Patient currently requires Min-Mod A with basic self-care skills secondary to muscle weakness, decreased cardiorespiratoy endurance, decreased coordination, and decreased standing balance and decreased balance strategies.  Prior to hospitalization, patient could complete BADLs with closer to Mod A for BADLs due to posterior hip precautions.  Patient will benefit from skilled intervention to increase independence with basic self-care skills prior to discharge home with care partner.  Anticipate patient will  require 24 hour supervision and follow up outpatient.  OT - End of Session Activity Tolerance: Tolerates 30+ min activity with multiple rests Endurance Deficit: Yes OT Assessment Rehab Potential (ACUTE ONLY): Good OT Barriers to Discharge: Weight bearing restrictions OT Patient demonstrates impairments in the following area(s): Balance;Cognition;Endurance;Motor;Pain;Perception;Safety OT Basic ADL's Functional Problem(s): Grooming;Bathing;Dressing;Toileting OT Transfers Functional Problem(s): Toilet;Tub/Shower OT Additional Impairment(s): Fuctional Use of Upper Extremity OT Plan OT Intensity: Minimum of 1-2 x/day, 45 to 90 minutes OT Frequency: 5 out of 7 days OT Duration/Estimated Length of Stay: 7-10 days OT Treatment/Interventions: Balance/vestibular training;Cognitive remediation/compensation;Community reintegration;Discharge planning;Disease mangement/prevention;DME/adaptive equipment instruction;Functional electrical stimulation;Functional mobility training;Neuromuscular re-education;Pain management;Patient/family education;Self Care/advanced ADL retraining;Splinting/orthotics;Therapeutic Activities;Therapeutic Exercise;UE/LE Strength taining/ROM;UE/LE Coordination activities OT Basic Self-Care Anticipated Outcome(s): Mod I OT Toileting Anticipated Outcome(s): Mod I OT Bathroom Transfers Anticipated Outcome(s): Mod I OT Recommendation Patient destination: Home Follow Up Recommendations: Outpatient OT Equipment Recommended: To be determined   OT Evaluation Precautions/Restrictions  Precautions Precautions: Posterior Hip Precaution Comments: mild R hemipareisis Restrictions Weight Bearing Restrictions: Yes RLE Weight Bearing: Weight bearing as tolerated General Chart Reviewed: Yes Family/Caregiver Present: No Home Living/Prior Functioning Home Living Family/patient expects to be discharged to:: Private residence Living Arrangements: Other relatives Available Help at  Discharge: Family, Available 24 hours/day Type of Home: House Home Access: Ramped entrance Home Layout: One level Bathroom Shower/Tub: Tub/shower unit (Stool, one grab bar at entrance, and long-handled shower head) Bathroom Toilet: Handicapped height (Has BSC over  toilet.) Bathroom Accessibility: Yes  Lives With: Family, Other (Comment) IADL History Homemaking Responsibilities: Yes Bill Paying/Finance Responsibility: Primary Current License: No Occupation: Retired Leisure and Hobbies: Spend time with grandchildren and read. Prior Function Level of Independence: Requires assistive device for independence, Needs assistance with homemaking Meal Prep: Maximal Light Housekeeping: Maximal Shopping: Total Driving: No Vocation: Retired Administrator, sports Baseline Vision/History: 1 Wears glasses Ability to See in Adequate Light: 0 Adequate Patient Visual Report: Eye fatigue/eye pain/headache Vision Assessment?: No apparent visual deficits Perception  Perception: Within Functional Limits Praxis Praxis: Intact Cognition Cognition Overall Cognitive Status: Within Functional Limits for tasks assessed Arousal/Alertness: Awake/alert Brief Interview for Mental Status (BIMS) Repetition of Three Words (First Attempt): 3 Temporal Orientation: Year: Correct Temporal Orientation: Month: Accurate within 5 days Temporal Orientation: Day: Correct Recall: "Sock": No, could not recall Recall: "Blue": Yes, no cue required Recall: "Bed": No, could not recall BIMS Summary Score: 11 Sensation Sensation Light Touch: Appears Intact Hot/Cold: Appears Intact Coordination Gross Motor Movements are Fluid and Coordinated: No Fine Motor Movements are Fluid and Coordinated: No Motor  Motor Motor: Other (comment) Motor - Skilled Clinical Observations: mainly limited by post surgical pain and weakness in RLE  Trunk/Postural Assessment  Cervical Assessment Cervical Assessment: Within Functional Limits Thoracic  Assessment Thoracic Assessment: Within Functional Limits Lumbar Assessment Lumbar Assessment: Within Functional Limits Postural Control Postural Control: Within Functional Limits  Balance Balance Balance Assessed: Yes Static Sitting Balance Static Sitting - Balance Support: Feet supported Static Sitting - Level of Assistance: 5: Stand by assistance Static Standing Balance Static Standing - Balance Support: During functional activity;Bilateral upper extremity supported Static Standing - Level of Assistance: 5: Stand by assistance (CGA) Extremity/Trunk Assessment RUE Assessment RUE Assessment: Exceptions to [facility] (Decreased coordination) General Strength Comments: 3+/5; Grip strength= 30# LUE Assessment LUE Assessment: Exceptions to [facility] General Strength Comments: 4/5; Grip strength=35#  Care Tool Care Tool Self Care Eating   Eating Assist Level: Set up assist    Oral Care    Oral Care Assist Level: Set up assist    Bathing   Body parts bathed by patient: Right arm;Left arm;Chest;Abdomen;Front perineal area;Right upper leg;Left upper leg;Face Body parts bathed by helper: Buttocks;Right lower leg;Left lower leg   Assist Level: Moderate Assistance - Patient 50 - 74%    Upper Body Dressing(including orthotics)   What is the patient wearing?: Pull over shirt   Assist Level: Set up assist    Lower Body Dressing (excluding footwear)   What is the patient wearing?: Underwear/pull up;Pants Assist for lower body dressing: Moderate Assistance - Patient 50 - 74%    Putting on/Taking off footwear   What is the patient wearing?: Non-skid slipper socks Assist for footwear: Maximal Assistance - Patient 25 - 49%       Care Tool Toileting Toileting activity Toileting Activity did not occur (Clothing management and hygiene only): N/A (no void or bm)       Care Tool Bed Mobility Roll left and right activity    Defer to PT Eval     Sit to lying activity    Defer to PT Eval      Lying to sitting on side of bed activity    Defer to PT Eval      Care Tool Transfers Sit to stand transfer   Sit to stand assist level: Minimal Assistance - Patient > 75%    Chair/bed transfer    Defer to PT Eval      Toilet transfer  Assist Level: Minimal Assistance - Patient > 75%     Care Tool Cognition  Expression of Ideas and Wants Expression of Ideas and Wants: 3. Some difficulty - exhibits some difficulty with expressing needs and ideas (e.g, some words or finishing thoughts) or speech is not clear  Understanding Verbal and Non-Verbal Content Understanding Verbal and Non-Verbal Content: 3. Usually understands - understands most conversations, but misses some part/intent of message. Requires cues at times to understand   Memory/Recall Ability Memory/Recall Ability : That he or she is in a hospital/hospital unit;Current season   Refer to Care Plan for Long Term Goals  SHORT TERM GOAL WEEK 1 OT Short Term Goal 1 (Week 1): STGs=LTGs due to patient's length of stay.  Recommendations for other services: None    Skilled Therapeutic Intervention Pt received resting in bed for skilled OT session with focus on comprehensive OT evaluation. Pt agreeable to interventions, demonstrating overall pleasant mood. Pt with no reports of pain during session. OT offering intermediate rest breaks and positioning suggestions throughout session to address potential pain/fatigue and maximize participation/safety in session.   Session began with introduction to OT role, OT POC, and general orientation to rehab unit/schedule. Pt declines ADL participation this session, levels below based on simulated movements and clinical judgement. Pt ambulates into/out of bathroom with CGA + RW + cuing for safe hand placement, perform toilet transfer with same level of assistance. Pt dependent transported to main therapy gym for time management, completing bilateral grip strength and 9-hole peg test, results  below: R-hand=~30 # L-hand=~35 9-hole= 41 secs  Pt remained sitting in WC with all immediate needs met at end of session. Pt continues to be appropriate for skilled OT intervention to promote further functional independence.   ADL ADL Eating: Set up Where Assessed-Eating: Wheelchair Grooming: Setup Where Assessed-Grooming: Wheelchair Upper Body Bathing: Setup Where Assessed-Upper Body Bathing: Wheelchair Lower Body Bathing: Minimal assistance Where Assessed-Lower Body Bathing: Wheelchair Upper Body Dressing: Setup Where Assessed-Upper Body Dressing: Wheelchair Lower Body Dressing: Minimal assistance Where Assessed-Lower Body Dressing: Wheelchair Toileting: Minimal assistance Where Assessed-Toileting: Toilet;Bedside Commode Toilet Transfer: Furniture conservator/restorer Method: Proofreader: Bedside commode;Grab bars Film/video editor: Unable to assess Film/video editor Method: Unable to assess Mobility  Bed Mobility Bed Mobility: Left Sidelying to Sit;Sit to Supine Left Sidelying to Sit: Supervision/Verbal cueing Sit to Supine: Minimal Assistance - Patient > 75% Transfers Sit to Stand: Contact Guard/Touching assist Stand to Sit: Contact Guard/Touching assist   Discharge Criteria: Patient will be discharged from OT if patient refuses treatment 3 consecutive times without medical reason, if treatment goals not met, if there is a change in medical status, if patient makes no progress towards goals or if patient is discharged from hospital.  The above assessment, treatment plan, treatment alternatives and goals were discussed and mutually agreed upon: by patient  Lou Cal, OTR/L, MSOT  02/03/2023, 9:00 AM

## 2023-02-03 NOTE — Evaluation (Signed)
Speech Language Pathology Assessment and Plan  Patient Details  Name: Brooke Davis MRN: [id number] Date of Birth: 04/09/1941  SLP Diagnosis: Aphasia  Rehab Potential: Good ELOS: 7-10 days    Today's Date: 02/03/2023 SLP Individual Time: 1415-1520 SLP Individual Time Calculation (min): 65 min   Hospital Problem: Principal Problem:   Embolic cerebral infarction [facility])  Past Medical History:  Past Medical History:  Diagnosis Date   Anxiety    a.) on BZO (alprazolam) PRN   Arthritis    fingers, right hip   Atrial fibrillation (HCC) 11/10/2013   a.) CHA2DS2VASc = 5 (age x2, sex, HTN, T2DM);  b.) rate/rhythm maintained on oral metoprolol tartrate; chronically anticoagulated with apixaban   Bronchitis    Cerebrovascular small vessel disease (chronic)    Coronary artery disease involving native coronary artery of native heart 01/11/2021   a.) LHC 01/11/2021: 40% oLM, 50% mLCx, 30% oLCx, 30% pLAD, 30% mLAD, 99% pRCA (2.75 x 30 mm Resolute Onyx DES), 75% p-mRCA, 60% dRCA   DDD (degenerative disc disease), lumbar 12/01/2013   Diastolic dysfunction 01/12/2021   a.) TTE 01/12/2021 (s/p inf STEMI): EF 55-60%, mild-mod LAE, mild RAE, mod RVE, mild AR/MR, mod TR, G1DD   Diet-controlled type 2 diabetes mellitus (HCC)    Full dentures    Gout    History of bilateral cataract extraction 2021   Hyperlipidemia    Hypertension, essential    Long term current use of anticoagulant    a.) apixaban   PONV (postoperative nausea and vomiting)    Sinoatrial node dysfunction (HCC) 11/10/2013   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) 01/11/2021   a.) LHC/PCI 01/11/2021 --> culprit lesion 99% pRCA --> 2.75 x 30 mm Resolute Onyx DES x 1   Varicose veins of legs    Wears hearing aid in both ears    Past Surgical History:  Past Surgical History:  Procedure Laterality Date   BREAST BIOPSY Left 2011   NEG   CARDIAC CATHETERIZATION     over 20 yrs ago.  All OK.    CATARACT EXTRACTION W/PHACO Left 03/27/2020   Procedure: CATARACT EXTRACTION PHACO AND INTRAOCULAR LENS PLACEMENT (IOC) LEFT 7.49  00:56.4  13.3%;  Surgeon: Lockie Mola, MD;  Location: [facility];  Service: Ophthalmology;  Laterality: Left;   CATARACT EXTRACTION W/PHACO Right 04/24/2020   Procedure: CATARACT EXTRACTION PHACO AND INTRAOCULAR LENS PLACEMENT (IOC) RIGHT;  Surgeon: Lockie Mola, MD;  Location: [facility];  Service: Ophthalmology;  Laterality: Right;  8.34 0:50.2 16.6%   CORONARY/GRAFT ACUTE MI REVASCULARIZATION N/A 01/11/2021   Procedure: Coronary/Graft Acute MI Revascularization;  Surgeon: Marcina Millard, MD;  Location: [facility];  Service: Cardiovascular;  Laterality: N/A;   LEFT HEART CATH AND CORONARY ANGIOGRAPHY N/A 01/11/2021   Procedure: LEFT HEART CATH AND CORONARY ANGIOGRAPHY;  Surgeon: Marcina Millard, MD;  Location: [facility];  Service: Cardiovascular;  Laterality: N/A;   PARTIAL HYSTERECTOMY     TOE SURGERY Left    great toe; rod in place   TOTAL HIP ARTHROPLASTY Right 01/18/2023   Procedure: TOTAL HIP ARTHROPLASTY;  Surgeon: Donato Heinz, MD;  Location: [facility];  Service: Orthopedics;  Laterality: Right;   TOTAL KNEE ARTHROPLASTY Right 2007    Assessment / Plan / Recommendation Clinical Impression  Pt is a 82 year old right-handed female with history of type 2 diabetes mellitus, hypertension, hyperlipidemia, atrial fibrillation maintained on Eliquis followed by cardiology Dr.Shaukat Welton Flakes, right total hip arthroplasty 01/18/2023 per  Dr. Francesco Sor, CAD status post PCI and stenting, diastolic congestive heart failure and anxiety. Per chart review patient modified independent with rollator prior to admission. 1 level home with ramped entrance. She lives with family and assistance as needed. Presented to [facility] 01/27/2023 with speech difficulty. It was noted patient had been convalescing at home after recent  hip surgery she had held her Eliquis for 3 days prior to that surgery. Cranial CT scan negative for acute changes. MRI limited by motion showing scattered foci of acute infarct in the left cerebral hemisphere and the left caudate. 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 50% stenosis of the origin of the dominant left vertebral artery. 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 No significant proximal stenosis aneurysm or branch vessel occlusion within the circle of Willis. Patient did not receive TNK. Admission chemistries unremarkable except sodium 134 glucose 123, hemoglobin 8.8. Echo with ejection fraction of 60 to 65% no wall motion abnormalities. Neurology follow-up patient currently remains on Eliquis as prior to admission with the addition of low-dose aspirin. She is tolerating a regular consistency diet. Vascular surgery was consulted in regards to bilateral carotid artery stenosis and plan outpatient follow-up. Palliative care was consulted to establish goals of care. Therapies were initiated. Patient continues to be weightbearing right lower extremity after recent right total hip arthroplasty. Therapy evaluations completed due to patient decreased functional mobility was admitted for a comprehensive rehab program.   Overall, pt presented w/ mild aphasia: expressive > receptive. Adequate word finding and thought formulation noted in simple conversation/structured speech tasks, though mild deficits evident w/ specific word finding in conversation, generative naming tasks, and in complex sentence formulation. Adequate auditory comprehension also noted in simple directions, Y/N questions, and in simple conversation. Mild deficits only noted as task complexity increased. Minimal errors noted during written expression of biographical  information and single words. Reading comprehension at word and sentence level [facility], though errors noted at sentences and short paragraph level. Suspect R visual inattention negatively impacted success, as she omitted the final 1-3 words during reading comprehension task and bumped her R hand during toilet transfer. Speech judged to be clear and intelligible throughout eval. She was able to ID errors, though unable to self correct INDly. At the end of structured eval tasks, pt was assisted to the bathroom upon request. She ambulated with her rolling walker and was continent of bladder. She demonstrated adequate sequencing, safety awareness, and problem solving throughout toileting/transfer back to bed. Formal memory tasks not completed 2* word finding deficits, however, she demonstrated adequate recall of recent events/medical history. Cognition appears WFL at this time.   Bedside swallow screen also completed w/ regular textures (cracker) and thin liquids via straw. No overt s/s of airway invasion noted and she demonstrated a safe and efficient oropharyngeal swallow. Recommend cont diet of regular textures/thin liquids. Meds whole with thin liquids. Dysphagia intervention not warranted.   Recommend skilled ST services to target mild aphasia and facilitate improved communication of complex thoughts/ideas and wants/needs.    Skilled Therapeutic Interventions          SLP facilitated cognitive-linguistic evaluation and bedside swallow screen. See above for more details.   SLP Assessment  Patient will need skilled Speech Lanaguage Pathology Services during CIR admission    Recommendations  SLP Diet Recommendations: Age appropriate regular solids;Thin  Liquid Administration via: Straw;Cup Medication Administration: Whole meds with liquid Supervision: Patient able to self feed Postural Changes and/or Swallow Maneuvers: Seated upright 90 degrees;Upright 30-60 min after meal Oral Care Recommendations: Oral  care BID;Patient independent with oral care Recommendations for Other Services: Therapeutic Recreation consult Therapeutic Recreation Interventions: Pet therapy Patient destination: Home Follow up Recommendations: Home Health SLP Equipment Recommended: None recommended by SLP    SLP Frequency 1 to 3 out of 7 days   SLP Duration  SLP Intensity  SLP Treatment/Interventions 7-10 days  Minumum of 1-2 x/day, 30 to 90 minutes  Cueing hierarchy;Cognitive remediation/compensation;Therapeutic Activities;Patient/family education;Internal/external aids;Speech/Language facilitation    Pain Pain Assessment Pain Scale: 0-10 Pain Score: 1  Pain Type: Surgical pain Pain Location: Hip Pain Orientation: Right Pain Descriptors / Indicators: Aching;Discomfort Pain Onset: On-going Patients Stated Pain Goal: 0 Pain Intervention(s): Medication (See eMAR)  Prior Functioning  IND  SLP Evaluation Cognition Overall Cognitive Status: Within Functional Limits for tasks assessed Arousal/Alertness: Awake/alert Orientation Level: Oriented X4 Year: 2024 Month: July Day of Week: Incorrect Attention: Focused;Sustained;Selective Focused Attention: Appears intact Sustained Attention: Appears intact Selective Attention: Impaired Selective Attention Impairment: Verbal complex;Verbal basic Memory: Appears intact (functional recall intact) Awareness: Appears intact Awareness Impairment: Intellectual impairment Problem Solving: Appears intact Executive Function: Sequencing;Initiating;Self Monitoring Sequencing: Appears intact Initiating: Appears intact Self Monitoring: Impaired Self Monitoring Impairment: Verbal complex Safety/Judgment: Appears intact  Comprehension Auditory Comprehension Overall Auditory Comprehension: Impaired Yes/No Questions: Impaired Basic Immediate Environment Questions: 75-100% accurate Complex Questions: 75-100% accurate Commands: Impaired One Step Basic Commands:  75-100% accurate Two Step Basic Commands: 75-100% accurate Multistep Basic Commands: 75-100% accurate Complex Commands: 75-100% accurate Conversation: Complex Interfering Components: Hearing;Processing speed EffectiveTechniques: Extra processing time;Repetition;Increased volume;Slowed speech Reading Comprehension Reading Status: Impaired Word level: Within functional limits Sentence Level: Within functional limits Paragraph Level: Impaired Functional Environmental (signs, name badge): Within functional limits Interfering Components: Visual scanning Effective Techniques: Eye glasses Expression Expression Primary Mode of Expression: Verbal Verbal Expression Overall Verbal Expression: Impaired Initiation: No impairment Automatic Speech: Social Response;Name;Counting Level of Generative/Spontaneous Verbalization: Conversation Repetition: No impairment Naming: Impairment Responsive: 76-100% accurate Confrontation: Impaired Convergent: 75-100% accurate Divergent: 75-100% accurate Verbal Errors: Phonemic paraphasias;Semantic paraphasias;Aware of errors Pragmatics: Impairment Effective Techniques: Semantic cues;Phonemic cues Written Expression Dominant Hand: Left Written Expression: Exceptions to [facility] Dictation Ability: Word Oral Motor Oral Motor/Sensory Function Overall Oral Motor/Sensory Function: Within functional limits Facial ROM: Within Functional Limits Facial Symmetry: Within Functional Limits Lingual ROM: Within Functional Limits Lingual Symmetry: Within Functional Limits Velum: Within Functional Limits Mandible: Within Functional Limits Motor Speech Overall Motor Speech: Appears within functional limits for tasks assessed Respiration: Within functional limits Phonation: Normal Resonance: Within functional limits Articulation: Within functional limitis Intelligibility: Intelligible Motor Planning: Witnin functional limits Motor Speech Errors: Not  applicable  Care Tool Care Tool Cognition Ability to hear (with hearing aid or hearing appliances if normally used Ability to hear (with hearing aid or hearing appliances if normally used): 2. Moderate difficulty - speaker has to increase volume and speak distinctly   Expression of Ideas and Wants Expression of Ideas and Wants: 3. Some difficulty - exhibits some difficulty with expressing needs and ideas (e.g, some words or finishing thoughts) or speech is not clear   Understanding Verbal and Non-Verbal Content Understanding Verbal and Non-Verbal Content: 3. Usually understands - understands most conversations, but misses some part/intent of message. Requires cues at times to understand  Memory/Recall Ability Memory/Recall Ability : That he or she is in a hospital/hospital unit;Current season  Motor Speech Assessment   Intelligibility: Intelligible  Bedside Swallowing Assessment General Date of Onset: 02/03/23 Diet Prior to this Study: Regular;Thin liquids (Level 0) Respiratory Status: Room air Behavior/Cognition: Alert;Cooperative;Pleasant mood Oral Cavity - Dentition: Dentures, top;Dentures, bottom Self-Feeding Abilities: Able to feed self Patient Positioning: Upright in bed Baseline Vocal Quality: Normal Volitional Cough: Strong Volitional Swallow: Able to elicit   Short Term Goals: Week 1: SLP Short Term Goal 1 (Week 1): STGs = LTGs 2* ELOS  Refer to Care Plan for Long Term Goals  Recommendations for other services: Therapeutic Recreation  Pet therapy  Discharge Criteria: Patient will be discharged from SLP if patient refuses treatment 3 consecutive times without medical reason, if treatment goals not met, if there is a change in medical status, if patient makes no progress towards goals or if patient is discharged from hospital.  The above assessment, treatment plan, treatment alternatives and goals were discussed and mutually agreed upon: by patient  Pati Gallo 02/03/2023, 4:11 PM

## 2023-02-03 NOTE — Plan of Care (Signed)
  Problem: RH Balance Goal: LTG Patient will maintain dynamic standing with ADLs (OT) Description: LTG:  Patient will maintain dynamic standing balance with assist during activities of daily living (OT)  Flowsheets (Taken 02/03/2023 1636) LTG: Pt will maintain dynamic standing balance during ADLs with: Independent with assistive device   Problem: RH Bathing Goal: LTG Patient will bathe all body parts with assist levels (OT) Description: LTG: Patient will bathe all body parts with assist levels (OT) Flowsheets (Taken 02/03/2023 1636) LTG: Pt will perform bathing with assistance level/cueing: Independent with assistive device    Problem: RH Dressing Goal: LTG Patient will perform lower body dressing w/assist (OT) Description: LTG: Patient will perform lower body dressing with assist, with/without cues in positioning using equipment (OT) Flowsheets (Taken 02/03/2023 1636) LTG: Pt will perform lower body dressing with assistance level of: Independent with assistive device   Problem: RH Toileting Goal: LTG Patient will perform toileting task (3/3 steps) with assistance level (OT) Description: LTG: Patient will perform toileting task (3/3 steps) with assistance level (OT)  Flowsheets (Taken 02/03/2023 1636) LTG: Pt will perform toileting task (3/3 steps) with assistance level: Independent with assistive device   Problem: RH Functional Use of Upper Extremity Goal: LTG Patient will use RT/LT upper extremity as a (OT) Description: LTG: Patient will use right/left upper extremity as a stabilizer/gross assist/diminished/nondominant/dominant level with assist, with/without cues during functional activity (OT) Flowsheets (Taken 02/03/2023 1636) LTG: Use of upper extremity in functional activities: RUE as nondominant level LTG: Pt will use upper extremity in functional activity with assistance level of: Independent with assistive device   Problem: RH Toilet Transfers Goal: LTG Patient will perform  toilet transfers w/assist (OT) Description: LTG: Patient will perform toilet transfers with assist, with/without cues using equipment (OT) Flowsheets (Taken 02/03/2023 1636) LTG: Pt will perform toilet transfers with assistance level of: Independent with assistive device   Problem: RH Tub/Shower Transfers Goal: LTG Patient will perform tub/shower transfers w/assist (OT) Description: LTG: Patient will perform tub/shower transfers with assist, with/without cues using equipment (OT) Flowsheets (Taken 02/03/2023 1636) LTG: Pt will perform tub/shower stall transfers with assistance level of: Independent with assistive device

## 2023-02-03 NOTE — Evaluation (Signed)
Physical Therapy Assessment and Plan  Patient Details  Name: Brooke Davis MRN: [id number] Date of Birth: 1941-06-13  PT Diagnosis: Difficulty walking, Hemiparesis dominant, Impaired cognition, Muscle spasms, Muscle weakness, and Pain in posterior hip surgical site Rehab Potential: Good ELOS: 7 days   Today's Date: 02/03/2023 PT Individual Time: 1107-1205 PT Individual Time Calculation (min): 58 min   Hospital Problem: Principal Problem:   Embolic cerebral infarction [facility])   Past Medical History:  Past Medical History:  Diagnosis Date   Anxiety    a.) on BZO (alprazolam) PRN   Arthritis    fingers, right hip   Atrial fibrillation (HCC) 11/10/2013   a.) CHA2DS2VASc = 5 (age x2, sex, HTN, T2DM);  b.) rate/rhythm maintained on oral metoprolol tartrate; chronically anticoagulated with apixaban   Bronchitis    Cerebrovascular small vessel disease (chronic)    Coronary artery disease involving native coronary artery of native heart 01/11/2021   a.) LHC 01/11/2021: 40% oLM, 50% mLCx, 30% oLCx, 30% pLAD, 30% mLAD, 99% pRCA (2.75 x 30 mm Resolute Onyx DES), 75% p-mRCA, 60% dRCA   DDD (degenerative disc disease), lumbar 12/01/2013   Diastolic dysfunction 01/12/2021   a.) TTE 01/12/2021 (s/p inf STEMI): EF 55-60%, mild-mod LAE, mild RAE, mod RVE, mild AR/MR, mod TR, G1DD   Diet-controlled type 2 diabetes mellitus (HCC)    Full dentures    Gout    History of bilateral cataract extraction 2021   Hyperlipidemia    Hypertension, essential    Long term current use of anticoagulant    a.) apixaban   PONV (postoperative nausea and vomiting)    Sinoatrial node dysfunction (HCC) 11/10/2013   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) 01/11/2021   a.) LHC/PCI 01/11/2021 --> culprit lesion 99% pRCA --> 2.75 x 30 mm Resolute Onyx DES x 1   Varicose veins of legs    Wears hearing aid in both ears    Past Surgical History:  Past Surgical History:  Procedure  Laterality Date   BREAST BIOPSY Left 2011   NEG   CARDIAC CATHETERIZATION     over 20 yrs ago.  All OK.   CATARACT EXTRACTION W/PHACO Left 03/27/2020   Procedure: CATARACT EXTRACTION PHACO AND INTRAOCULAR LENS PLACEMENT (IOC) LEFT 7.49  00:56.4  13.3%;  Surgeon: Lockie Mola, MD;  Location: [facility];  Service: Ophthalmology;  Laterality: Left;   CATARACT EXTRACTION W/PHACO Right 04/24/2020   Procedure: CATARACT EXTRACTION PHACO AND INTRAOCULAR LENS PLACEMENT (IOC) RIGHT;  Surgeon: Lockie Mola, MD;  Location: [facility];  Service: Ophthalmology;  Laterality: Right;  8.34 0:50.2 16.6%   CORONARY/GRAFT ACUTE MI REVASCULARIZATION N/A 01/11/2021   Procedure: Coronary/Graft Acute MI Revascularization;  Surgeon: Marcina Millard, MD;  Location: [facility] INVASIVE CV LAB;  Service: Cardiovascular;  Laterality: N/A;   LEFT HEART CATH AND CORONARY ANGIOGRAPHY N/A 01/11/2021   Procedure: LEFT HEART CATH AND CORONARY ANGIOGRAPHY;  Surgeon: Marcina Millard, MD;  Location: [facility] INVASIVE CV LAB;  Service: Cardiovascular;  Laterality: N/A;   PARTIAL HYSTERECTOMY     TOE SURGERY Left    great toe; rod in place   TOTAL HIP ARTHROPLASTY Right 01/18/2023   Procedure: TOTAL HIP ARTHROPLASTY;  Surgeon: Donato Heinz, MD;  Location: [facility] ORS;  Service: Orthopedics;  Laterality: Right;   TOTAL KNEE ARTHROPLASTY Right 2007    Assessment & Plan Clinical Impression: Patient is a 82 y.o. right-handed female with history of type 2 diabetes mellitus, hypertension, hyperlipidemia, atrial fibrillation maintained on  Eliquis followed by cardiology Dr.Shaukat Welton Flakes, 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 Francesco Sor, CAD status post PCI and stenting, diastolic congestive heart failure and anxiety. Per chart review patient modified independent with rollator prior to admission. 1 level home with ramped entrance. She lives with family and assistance as needed. Presented to [facility]  01/27/2023 with speech difficulty. It was noted patient had been convalescing at home after recent hip surgery she had held her Eliquis for 3 days prior to that surgery. Cranial CT scan negative for acute changes. MRI limited by motion showing scattered foci of acute infarct in the left cerebral hemisphere and the left caudate. 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 50% stenosis of the origin of the dominant left vertebral artery. 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 No significant proximal stenosis aneurysm or branch vessel occlusion within the circle of Willis. Patient did not receive TNK. Admission chemistries unremarkable except sodium 134 glucose 123, hemoglobin 8.8. Echo with ejection fraction of 60 to 65% no wall motion abnormalities. Neurology follow-up patient currently remains on Eliquis as prior to admission with the addition of low-dose aspirin. She is tolerating a regular consistency diet. Vascular surgery was consulted in regards to bilateral carotid artery stenosis and plan outpatient follow-up. Palliative care was consulted to establish goals of care. Therapies were initiated. Patient continues to be weightbearing right lower extremity after recent right total hip arthroplasty. Therapy evaluations completed due to patient decreased functional mobility was admitted for a comprehensive rehab program. Patient transferred to CIR on 02/02/2023 .   Patient currently requires  overal CGA  with mobility secondary to muscle weakness, decreased cardiorespiratoy endurance, unbalanced muscle activation and decreased coordination, decreased problem solving and decreased safety awareness, and decreased standing balance and decreased balance strategies.  Prior to hospitalization, patient was modified independent  with mobility and  lived with Family, Other (Comment) (Has 4 daughters that stop by and can take turns to check in) in a House home.  Home access is  Ramped entrance.  Patient will benefit from skilled PT intervention to maximize safe functional mobility, minimize fall risk, and decrease caregiver burden for planned discharge home with 24 hour supervision.  Anticipate patient will benefit from follow up HH at discharge.  PT - End of Session Activity Tolerance: Tolerates 30+ min activity with multiple rests Endurance Deficit: Yes PT Assessment Rehab Potential (ACUTE/IP ONLY): Good PT Barriers to Discharge: Decreased caregiver support;Wound Care;Insurance for SNF coverage;Weight bearing restrictions PT Patient demonstrates impairments in the following area(s): Balance;Edema;Endurance;Motor;Pain;Perception;Safety;Sensory;Skin Integrity PT Transfers Functional Problem(s): Bed Mobility;Bed to Chair;Car;Furniture PT Locomotion Functional Problem(s): Ambulation;Stairs;Wheelchair Mobility PT Plan PT Intensity: Minimum of 1-2 x/day ,45 to 90 minutes PT Frequency: 5 out of 7 days PT Duration Estimated Length of Stay: 7 days PT Treatment/Interventions: Ambulation/gait training;Community reintegration;DME/adaptive equipment instruction;Neuromuscular re-education;Psychosocial support;Stair training;UE/LE Strength taining/ROM;Wheelchair propulsion/positioning;Balance/vestibular training;Discharge planning;Pain management;Skin care/wound management;Therapeutic Activities;UE/LE Coordination activities;Cognitive remediation/compensation;Disease management/prevention;Functional mobility training;Patient/family education;Splinting/orthotics;Therapeutic Exercise;Visual/perceptual remediation/compensation PT Transfers Anticipated Outcome(s): Mod I PT Locomotion Anticipated Outcome(s): supervision PT Recommendation Follow Up Recommendations: Outpatient PT;24 hour supervision/assistance Patient destination: Home Equipment Recommended:  To be determined   PT Evaluation Precautions/Restrictions Precautions Precautions: Posterior Hip Precaution Booklet Issued: No Precaution Comments: mild R hemipareisis vs pain limiting Restrictions Weight Bearing Restrictions: Yes RLE Weight Bearing: Weight bearing as tolerated General   Vital Signs  Pain Pain Assessment  Pain Scale: 0-10 Pain Score: 1  Pain Type: Surgical pain Pain Location: Hip Pain Orientation: Right Pain Descriptors / Indicators: Aching;Discomfort Pain Onset: On-going Patients Stated Pain Goal: 0 Pain Intervention(s): Medication (See eMAR) Multiple Pain Sites: No Pain Interference Pain Interference Pain Effect on Sleep: 2. Occasionally Pain Interference with Therapy Activities: 2. Occasionally Pain Interference with Day-to-Day Activities: 2. Occasionally Home Living/Prior Functioning Home Living Living Arrangements: Other relatives (Grandson and [place]) Available Help at Discharge: Family;Available 24 hours/day Type of Home: House Home Access: Ramped entrance Home Layout: One level Bathroom Shower/Tub: Tub/shower unit (Stool, one grab bar at entrance, and long-handled shower head) Bathroom Toilet: Handicapped height (Has BSC over toilet.) Bathroom Accessibility: Yes  Lives With: Family;Other (Comment) (Has 4 daughters that stop by and can take turns to check in) Prior Function Level of Independence: Requires assistive device for independence;Needs assistance with homemaking;Needs assistance with ADLs  Able to Take Stairs?: Yes Driving: No Vocation: Retired Optometrist - History Ability to See in Adequate Light: 0 Adequate Vision - Assessment Eye Alignment: Within Functional Limits Ocular Range of Motion: Within Functional Limits Alignment/Gaze Preference: Within Defined Limits Tracking/Visual Pursuits: Able to track stimulus in all quads without difficulty Convergence: Within functional  limits Perception Perception: Within Functional Limits Praxis Praxis: Intact  Cognition Overall Cognitive Status: Within Functional Limits for tasks assessed Arousal/Alertness: Awake/alert Orientation Level: Oriented X4 Year: 2024 Month: July Day of Week: Incorrect Attention: Focused;Sustained Focused Attention: Appears intact Sustained Attention: Appears intact Selective Attention: Impaired Selective Attention Impairment: Verbal complex;Verbal basic Memory:  (further assessment needed) Awareness: Appears intact Awareness Impairment: Intellectual impairment Problem Solving:  (further assessment needed) Executive Function:  (some word finding difficulties) Sequencing: Appears intact Initiating: Appears intact Self Monitoring: Impaired Self Monitoring Impairment: Verbal complex Safety/Judgment: Other (comment) (can only remember 1/3 movement precautions) Sensation Sensation Light Touch: Appears Intact Coordination Gross Motor Movements are Fluid and Coordinated: No Fine Motor Movements are Fluid and Coordinated: No Coordination and Movement Description: mainly limited by post surgical pain and weakness in RLE Heel Shin Test: unable with RLE d/t weakness and precautions; WFL for LLE Motor  Motor Motor: Other (comment) Motor - Skilled Clinical Observations: mainly limited by post surgical pain and weakness in RLE   Trunk/Postural Assessment  Cervical Assessment Cervical Assessment: Within Functional Limits Thoracic Assessment Thoracic Assessment: Exceptions to [facility] (rounded shoulders) Lumbar Assessment Lumbar Assessment: Within Functional Limits Postural Control Postural Control: Within Functional Limits  Balance Balance Balance Assessed: Yes Static Sitting Balance Static Sitting - Balance Support: Feet supported Static Sitting - Level of Assistance: 5: Stand by assistance Dynamic Sitting Balance Dynamic Sitting - Balance Support: Feet supported;During functional  activity Dynamic Sitting - Level of Assistance: Other (comment) (CGA) Dynamic Sitting Balance - Compensations: CGA provided especially for maintaining precautions Static Standing Balance Static Standing - Balance Support: During functional activity;Bilateral upper extremity supported Static Standing - Level of Assistance: 5: Stand by assistance Dynamic Standing Balance Dynamic Standing - Balance Support: During functional activity;Bilateral upper extremity supported Dynamic Standing - Level of Assistance: Other (comment);4: Min assist (CGA/ MINA) Extremity Assessment  RUE Assessment RUE Assessment: Exceptions to [facility] (Decreased coordination) General Strength Comments: 3+/5; Grip strength= 30# LUE Assessment LUE Assessment: Exceptions to [facility] General Strength Comments: 4/5; Grip strength=35# RLE Assessment RLE Assessment: Exceptions to [facility] RLE Strength RLE Overall Strength: Deficits Right Hip Flexion: 3-/5 Right Hip Extension: 3/5 Right Hip ABduction: 3-/5 Right Hip ADduction: 3+/5 Right Knee Flexion: 4-/5 Right Knee Extension: 4-/5 Right Ankle Dorsiflexion:  4/5 Right Ankle Plantar Flexion: 4-/5 LLE Assessment LLE Assessment: Exceptions to [facility] LLE Strength LLE Overall Strength: Deficits Left Hip Flexion: 4/5 Left Hip Extension: 4/5 Left Hip ABduction: 4/5 Left Hip ADduction: 4/5 Left Knee Flexion: 4+/5 Left Knee Extension: 4+/5 Left Ankle Dorsiflexion: 5/5 Left Ankle Plantar Flexion: 4+/5  Care Tool Care Tool Bed Mobility Roll left and right activity   Roll left and right assist level: Supervision/Verbal cueing    Sit to lying activity   Sit to lying assist level: Minimal Assistance - Patient > 75%    Lying to sitting on side of bed activity   Lying to sitting on side of bed assist level: the ability to move from lying on the back to sitting on the side of the bed with no back support.: Supervision/Verbal cueing     Care Tool Transfers Sit to stand transfer   Sit  to stand assist level: Contact Guard/Touching assist    Chair/bed transfer   Chair/bed transfer assist level: Contact Guard/Touching assist     Toilet transfer   Assist Level: Contact Guard/Touching assist    Careers adviser transfer assist level: Minimal Assistance - Patient > 75%      Care Tool Locomotion Ambulation   Assist level: Contact Guard/Touching assist Assistive device: Walker-rolling Max distance: 20 ft  Walk 10 feet activity   Assist level: Contact Guard/Touching assist Assistive device: Walker-rolling   Walk 50 feet with 2 turns activity Walk 50 feet with 2 turns activity did not occur: Safety/medical concerns      Walk 150 feet activity Walk 150 feet activity did not occur: Safety/medical concerns      Walk 10 feet on uneven surfaces activity Walk 10 feet on uneven surfaces activity did not occur: Safety/medical concerns      Stairs   Assist level: Minimal Assistance - Patient > 75% Stairs assistive device: 2 hand rails Max number of stairs: 3  Walk up/down 1 step activity   Walk up/down 1 step (curb) assist level: Minimal Assistance - Patient > 75% Walk up/down 1 step or curb assistive device: 2 hand rails  Walk up/down 4 steps activity Walk up/down 4 steps activity did not occur: Safety/medical concerns      Walk up/down 12 steps activity Walk up/down 12 steps activity did not occur: Safety/medical concerns      Pick up small objects from floor Pick up small object from the floor (from standing position) activity did not occur:  (unable d/t movement precautions and pain) Pick up small object from the floor assist level: Dependent - Patient 0%    Wheelchair Is the patient using a wheelchair?: Yes Type of Wheelchair: Manual   Wheelchair assist level: Dependent - Patient 0%    Wheel 50 feet with 2 turns activity   Assist Level: Dependent - Patient 0%  Wheel 150 feet activity   Assist Level: Dependent - Patient 0%    Refer to Care Plan for Long  Term Goals  SHORT TERM GOAL WEEK 1 PT Short Term Goal 1 (Week 1): STG = LTG d/t ELOS  Recommendations for other services: None   Skilled Therapeutic Intervention Mobility Bed Mobility Bed Mobility: Left Sidelying to Sit;Sit to Supine Left Sidelying to Sit: Supervision/Verbal cueing Sit to Supine: Minimal Assistance - Patient > 75% Transfers Transfers: Sit to Stand;Stand to Sit;Stand Pivot Transfers Sit to Stand: Contact Guard/Touching assist Stand to Sit: Contact Guard/Touching assist Stand Pivot Transfers: Contact Guard/Touching assist Transfer (Assistive device): Rolling walker  Locomotion  Gait Ambulation: Yes Gait Assistance: Contact Guard/Touching assist Assistive device: Rolling walker Gait Gait: Yes Gait Pattern: Antalgic;Step-to pattern;Decreased weight shift to right Gait velocity: decreased Stairs / Additional Locomotion Stairs: Yes Stairs Assistance: Contact Guard/Touching assist;Minimal Assistance - Patient > 75% Stair Management Technique: Forwards;Backwards (Forward to ascend; backward to descend) Number of Stairs: 3 Height of Stairs: 6 Wheelchair Mobility Wheelchair Mobility: No   Discharge Criteria: Patient will be discharged from PT if patient refuses treatment 3 consecutive times without medical reason, if treatment goals not met, if there is a change in medical status, if patient makes no progress towards goals or if patient is discharged from hospital.  The above assessment, treatment plan, treatment alternatives and goals were discussed and mutually agreed upon: by patient  Skilled Intervention: PT Evaluation completed; see above for results. PT educated patient in roles of PT vs OT, PT POC, rehab potential, rehab goals, and discharge recommendations along with recommendation for follow-up rehabilitation services. Individual treatment initiated:  Patient supine in bed upon PT arrival. Patient alert and agreeable to PT session. Pt has been pre-medicated  and relates 1/10 pain complaint at start of session.  Therapeutic Activity: Bed Mobility: Patient performed supine to sit with extra time and supervision. Good technique with no cues required. Transfers: Patient performed sit <> stand throughout session improving in pain and performance from MinA to supervision. Provided vc/tc for pushing from seated surface as pt tends to push with BUE on RW.   Simulated car transfer performed with minA for bringing RLE into/ out of footwell of car. Feels confident re: car transfer.   Gait Training:  Patient ambulated 20 feet using RW with CGA. Ambulated with very slow pace, step-to pattern leading with RLE and increased BUE pressure into RW. Discussed potential decreased safety using rollator and potential need for RW initially on return home. Will practice with rollator prior to d/c to assess.  Pt guided in stair training with physical demonstration and verbal instructions provided prior to performance. Pt does not remember which LE to lead with and cued prior to start. Pt is able to complete three 6" steps using BHR with CGA to ascend leading with LLE and MinA to descend backward leading with RLE. VC provided at initiation of each direction for leading LE.    Patient supine  in bed at end of session with brakes locked, bed alarm set, and all needs within reach. Oriented to time and usual time of lunch arrival.   Loel Dubonnet PT, DPT, CSRS 02/03/2023, 6:19 PM

## 2023-02-03 NOTE — Patient Care Conference (Signed)
Inpatient RehabilitationTeam Conference and Plan of Care Update Date: 02/03/2023   Time: 10:07 AM    Patient Name: Brooke Davis      Medical Record Number: [id number]  Date of Birth: 02/08/1941 Sex: Female         Room/Bed: 4W08C/4W08C-01 Payor Info: Payor: Advertising copywriter MEDICARE / Plan: [facility] MEDICARE / Product Type: *No Product type* /    Admit Date/Time:  02/02/2023  3:08 PM  Primary Diagnosis:  Embolic cerebral infarction [facility])  Hospital Problems: Principal Problem:   Embolic cerebral infarction [facility])    Expected Discharge Date:    Team Members Present: Physician leading conference: (P) Dr. Claudette Laws Social Worker Present: (P) Lavera Guise, BSW Nurse Present: (P) Chana Bode, RN PT Present: Demetrius Charity) Wynelle Link, PT;Dominic [place], PTA OT Present: Demetrius Charity) Bonnell Public, OT     Current Status/Progress Goal Weekly Team Focus  Bowel/Bladder   Pt is contient of B/B LBM: 02/01/23   remain continent of B/B   toileting qshift and PRN    Swallow/Nutrition/ Hydration   Eval Pending           ADL's   eval pending   eval pending   eval pending    Mobility   PT EVAL PENDING           Communication   Eval Pending            Safety/Cognition/ Behavioral Observations  Eval Pending            Pain   Patient states pain 7/10 r hip   pt states pain 3/10   pain assessment qshift and PRN    Skin   Pt has incision R hip, otherwise skin intact   skin integrity remain intact  skin assessment qshift and PRN      Discharge Planning:  New admission, assesment pending   Team Discussion: Patient post THR off Eliquis for 3 days prior; presenting with embolic stroke. Staples to right hip; posterior hip precautions/WBAT.  Patient on target to meet rehab goals: Evals pending  *See Care Plan and progress notes for long and short-term goals.   Revisions to Treatment Plan:  Donato Heinz, MD Follow up on 03/02/2023.  Specialty: Orthopedic  Surgery   Teaching Needs: Safety, medications, skin care, dietary modification, transfers, toileting, etc.   Current Barriers to Discharge: Decreased caregiver support  Possible Resolutions to Barriers: Family education HH follow up services     Medical Summary Current Status: CAD status post PCI and stenting, diastolic congestive heart failure and anxiety, hx of Afib, embolic infarcts  Barriers to Discharge: Medical stability   Possible Resolutions to Barriers/Weekly Focus: cont BP management, initiate rehab program   Continued Need for Acute Rehabilitation Level of Care: The patient requires daily medical management by a physician with specialized training in physical medicine and rehabilitation for the following reasons: Direction of a multidisciplinary physical rehabilitation program to maximize functional independence : Yes Medical management of patient stability for increased activity during participation in an intensive rehabilitation regime.: Yes Analysis of laboratory values and/or radiology reports with any subsequent need for medication adjustment and/or medical intervention. : Yes   I attest that I was present, lead the team conference, and concur with the assessment and plan of the team.   Chana Bode B 02/03/2023, 3:01 PM

## 2023-02-03 NOTE — Plan of Care (Signed)
  Problem: RH Comprehension Communication Goal: LTG Patient will comprehend basic/complex auditory (SLP) Description: LTG: Patient will comprehend basic/complex auditory information with cues (SLP). Flowsheets (Taken 02/03/2023 1632) LTG: Patient will comprehend: Complex auditory information LTG: Patient will comprehend auditory information with cueing (SLP): Supervision   Problem: RH Expression Communication Goal: LTG Patient will verbally express basic/complex needs(SLP) Description: LTG:  Patient will verbally express basic/complex needs, wants or ideas with cues  (SLP) Flowsheets (Taken 02/03/2023 1632) LTG: Patient will verbally express basic/complex needs, wants or ideas (SLP): Modified Independent Note: Complex thoughts/ideas and wants/needs    Problem: RH Pre-functional/Other (Specify) Goal: RH LTG SLP (Specify) 1 Description: RH LTG SLP (Specify) 1 02/03/2023 1634 by Pati Gallo, CCC-SLP Flowsheets (Taken 02/03/2023 1632) LTG: Other SLP (Specify) 1: Utilize compensatory strategies such as a head turn to assist with R visual attention during functional and structured tasks w/ supervision cues 02/03/2023 1632 by Pati Gallo, CCC-SLP Flowsheets (Taken 02/03/2023 1632) LTG: Other SLP (Specify) 1: Utilize compensatory strategies such as a head turn to assist with R visual attention during functional and structured tasks w/ supervision cues

## 2023-02-03 NOTE — Progress Notes (Signed)
PROGRESS NOTE   Subjective/Complaints: Seen by OT this am overall minA  No pain or breathing issues , pt states hip incision is over Right buttocks area Slept ok Labs reviewed  ROS- No CP, SOB, N/V/D  Objective:   DG Chest 2 View  Result Date: 02/02/2023 CLINICAL DATA:  Chest pain EXAM: CHEST - 2 VIEW COMPARISON:  02/18/2021 FINDINGS: The lungs are symmetrically well expanded. Small bilateral pleural effusions. Lungs are clear. No pneumothorax. Cardiac size is within normal limits. Pulmonary vascularity is normal. No acute bone abnormality. IMPRESSION: 1. Small bilateral pleural effusions. Electronically Signed   By: Helyn Numbers M.D.   On: 02/02/2023 22:05   Recent Labs    02/03/23 0612  WBC 9.9  HGB 8.2*  HCT 25.3*  PLT 366   Recent Labs    02/03/23 0612  NA 134*  K 3.6  CL 99  CO2 28  GLUCOSE 142*  BUN 22  CREATININE 1.23*  CALCIUM 8.6*    Intake/Output Summary (Last 24 hours) at 02/03/2023 0914 Last data filed at 02/03/2023 0811 Gross per 24 hour  Intake 560 ml  Output 350 ml  Net 210 ml        Physical Exam: Vital Signs Blood pressure (!) 112/54, pulse 79, temperature 98.4 F (36.9 C), resp. rate 17, height 5\' 2"  (1.575 m), weight 83.6 kg, SpO2 100%.   General: No acute distress Mood and affect are appropriate Heart: Regular rate and rhythm no rubs murmurs or extra sounds Lungs: Clear to auscultation, breathing unlabored, no rales or wheezes Abdomen: Positive bowel sounds, soft nontender to palpation, nondistended Extremities: No clubbing, cyanosis, or edema Skin: No evidence of breakdown, no evidence of rash Neurologic: Cranial nerves II through XII intact, motor strength is 4/5 in bilateral deltoid, bicep, tricep, grip, hip flexor, knee extensors, ankle dorsiflexor and plantar flexor Sensory exam normal sensation to light touch  in bilateral upper and lower extremities Cerebellar exam normal  finger to nose to finger Musculoskeletal: Full range of motion in all 4 extremities. No joint swelling   Assessment/Plan: 1. Functional deficits which require 3+ hours per day of interdisciplinary therapy in a comprehensive inpatient rehab setting. Physiatrist is providing close team supervision and 24 hour management of active medical problems listed below. Physiatrist and rehab team continue to assess barriers to discharge/monitor patient progress toward functional and medical goals  Care Tool:  Bathing    Body parts bathed by patient: Right arm, Left arm, Chest, Abdomen, Front perineal area, Right upper leg, Left upper leg, Face   Body parts bathed by helper: Buttocks, Right lower leg, Left lower leg     Bathing assist Assist Level: Moderate Assistance - Patient 50 - 74%     Upper Body Dressing/Undressing Upper body dressing   What is the patient wearing?: Pull over shirt    Upper body assist Assist Level: Set up assist    Lower Body Dressing/Undressing Lower body dressing      What is the patient wearing?: Underwear/pull up, Pants     Lower body assist Assist for lower body dressing: Moderate Assistance - Patient 50 - 74%     Toileting Toileting Toileting Activity did  not occur Press photographer and hygiene only): N/A (no void or bm)  Toileting assist       Transfers Chair/bed transfer  Transfers assist           Locomotion Ambulation   Ambulation assist              Walk 10 feet activity   Assist           Walk 50 feet activity   Assist           Walk 150 feet activity   Assist           Walk 10 feet on uneven surface  activity   Assist           Wheelchair     Assist               Wheelchair 50 feet with 2 turns activity    Assist            Wheelchair 150 feet activity     Assist          Blood pressure (!) 112/54, pulse 79, temperature 98.4 F (36.9 C), resp. rate 17,  height 5\' 2"  (1.575 m), weight 83.6 kg, SpO2 100%. Medical Problem List and Plan: 1. 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  Antithrombotics: -DVT/anticoagulation: Eliquis             -antiplatelet therapy: Aspirin 81 mg daily 3. Pain Management: Oxycodone/tramadol as needed- mainly taking tramadol prn- for R THP done 01/18/23 4. Mood/Behavior/Sleep: Celexa 20 mg daily, Klonopin 0.5 mg 3 times daily as needed             -antipsychotic agents: N/A 5. Neuropsych/cognition: This patient is capable of making decisions on her own behalf. 6. Skin/Wound Care: Routine skin checks 7. Fluids/Electrolytes/Nutrition: Routine in and outs with follow-up chemistries 8.  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 Francesco Sor.  Weightbearing as tolerated- honeycomb still in place- per pt, wanted to leave staples "a few more days". Posterior hip precautions  9.  Acute blood loss anemia after THA.  Follow-up CBC 10.  CAD/PCI and stenting.  Continue aspirin therapy 11.  Atrial fibrillation.  Maintained on Eliquis as well as Lopressor 25 mg twice daily.  Cardiac rate controlled.  Follow-up Dr.Shaukat Delbert [place].  Diastolic congestive heart failure.  Demadex 20 mg daily, Entresto 24-26 mg twice daily.  Monitor for any signs of fluid overload 13.  Hyperlipidemia.  Lipitor 14.  History of gout.  Zyloprim 300 mg daily.  Monitor for any gout flareups 15.  Type 2 diabetes mellitus.  Hemoglobin A1c 6.1.  Patient on no medication prior to admission.  CBGs discontinued. 16.  Bilateral carotid stenosis.  Follow-up outpatient vascular surgery Dr. Wyn Quaker     LOS: 1 days A FACE TO FACE EVALUATION WAS PERFORMED  Erick Colace 02/03/2023, 9:14 AM

## 2023-02-03 NOTE — Progress Notes (Signed)
Inpatient Rehabilitation Care Coordinator Assessment and Plan Patient Details  Name: Brooke Davis MRN: [id number] Date of Birth: July 10, 1941  Today's Date: 02/03/2023  Hospital Problems: Principal Problem:   Embolic cerebral infarction [facility])  Past Medical History:  Past Medical History:  Diagnosis Date   Anxiety    a.) on BZO (alprazolam) PRN   Arthritis    fingers, right hip   Atrial fibrillation (HCC) 11/10/2013   a.) CHA2DS2VASc = 5 (age x2, sex, HTN, T2DM);  b.) rate/rhythm maintained on oral metoprolol tartrate; chronically anticoagulated with apixaban   Bronchitis    Cerebrovascular small vessel disease (chronic)    Coronary artery disease involving native coronary artery of native heart 01/11/2021   a.) LHC 01/11/2021: 40% oLM, 50% mLCx, 30% oLCx, 30% pLAD, 30% mLAD, 99% pRCA (2.75 x 30 mm Resolute Onyx DES), 75% p-mRCA, 60% dRCA   DDD (degenerative disc disease), lumbar 12/01/2013   Diastolic dysfunction 01/12/2021   a.) TTE 01/12/2021 (s/p inf STEMI): EF 55-60%, mild-mod LAE, mild RAE, mod RVE, mild AR/MR, mod TR, G1DD   Diet-controlled type 2 diabetes mellitus (HCC)    Full dentures    Gout    History of bilateral cataract extraction 2021   Hyperlipidemia    Hypertension, essential    Long term current use of anticoagulant    a.) apixaban   PONV (postoperative nausea and vomiting)    Sinoatrial node dysfunction (HCC) 11/10/2013   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) 01/11/2021   a.) LHC/PCI 01/11/2021 --> culprit lesion 99% pRCA --> 2.75 x 30 mm Resolute Onyx DES x 1   Varicose veins of legs    Wears hearing aid in both ears    Past Surgical History:  Past Surgical History:  Procedure Laterality Date   BREAST BIOPSY Left 2011   NEG   CARDIAC CATHETERIZATION     over 20 yrs ago.  All OK.   CATARACT EXTRACTION W/PHACO Left 03/27/2020   Procedure: CATARACT EXTRACTION PHACO AND INTRAOCULAR LENS PLACEMENT (IOC) LEFT 7.49  00:56.4   13.3%;  Surgeon: Lockie Mola, MD;  Location: [facility];  Service: Ophthalmology;  Laterality: Left;   CATARACT EXTRACTION W/PHACO Right 04/24/2020   Procedure: CATARACT EXTRACTION PHACO AND INTRAOCULAR LENS PLACEMENT (IOC) RIGHT;  Surgeon: Lockie Mola, MD;  Location: [facility];  Service: Ophthalmology;  Laterality: Right;  8.34 0:50.2 16.6%   CORONARY/GRAFT ACUTE MI REVASCULARIZATION N/A 01/11/2021   Procedure: Coronary/Graft Acute MI Revascularization;  Surgeon: Marcina Millard, MD;  Location: [facility] INVASIVE CV LAB;  Service: Cardiovascular;  Laterality: N/A;   LEFT HEART CATH AND CORONARY ANGIOGRAPHY N/A 01/11/2021   Procedure: LEFT HEART CATH AND CORONARY ANGIOGRAPHY;  Surgeon: Marcina Millard, MD;  Location: [facility] INVASIVE CV LAB;  Service: Cardiovascular;  Laterality: N/A;   PARTIAL HYSTERECTOMY     TOE SURGERY Left    great toe; rod in place   TOTAL HIP ARTHROPLASTY Right 01/18/2023   Procedure: TOTAL HIP ARTHROPLASTY;  Surgeon: Donato Heinz, MD;  Location: [facility] ORS;  Service: Orthopedics;  Laterality: Right;   TOTAL KNEE ARTHROPLASTY Right 2007   Social History:  reports that she quit smoking about 44 years ago. Her smoking use included cigarettes. She has never used smokeless tobacco. She reports that she does not currently use alcohol. She reports that she does not use drugs.  Family / Support Systems Marital Status: Widow/Widower How Long?: n/a Patient Roles: Parent Spouse/Significant Other: n/a Children: [name] (dtr), Angela (dtr), Raynelle Fanning (dte), Darl Pikes (  dtr) Other Supports: Scientist, product/process development (grandson) Anticipated Caregiver: Daughter, [name] and other family memebers Ability/Limitations of Caregiver: none Caregiver Availability: 24/7 Family Dynamics: supportive family.  Social History Preferred language: English Religion: None Cultural Background: Independent in home with grandchildren and great grandchildren Education: [employer]  Literacy - How often do you need to have someone help you when you read instructions, pamphlets, or other written material from your doctor or pharmacy?: Sometimes Writes: Yes Legal History/Current Legal Issues: n/a Guardian/Conservator: [name]   Abuse/Neglect Abuse/Neglect Assessment Can Be Completed: Yes Physical Abuse: Denies Verbal Abuse: Denies Sexual Abuse: Denies Exploitation of patient/patient's resources: Denies Self-Neglect: Denies  Patient response to: Social Isolation - How often do you feel lonely or isolated from those around you?: Never  Emotional Status Psychiatric History: hx of anxiety Substance Abuse History: n/a  Patient / Family Perceptions, Expectations & Goals Pt/Family understanding of illness & functional limitations: yes Premorbid pt/family roles/activities: Limited prior only going out for appointments. Assist with ADLS Anticipated changes in roles/activities/participation: Daughter and family plans to assist patient a supervision-MinA Pt/family expectations/goals: [employer]: None Premorbid Home Care/DME Agencies: Other (Comment) (RW, WC, BSC, Shower Chair, Rollator, toilet riser) Transportation available at discharge: Family able to transport Is the patient able to respond to transportation needs?: Yes (Per daughter, not appointments missed.) In the past 12 months, has lack of transportation kept you from medical appointments or from getting medications?: No In the past 12 months, has lack of transportation kept you from meetings, work, or from getting things needed for daily living?: No Resource referrals recommended: Neuropsychology  Discharge Planning Living Arrangements: Other relatives (Grandson and Doctor, hospital) Support Systems: Children, Other relatives (Daughter and other family to assist.) Type of Residence: Private residence (1 level home, ramp entrance) Civil engineer, contracting:  Media planner (specify) ([employer] Medicare) Surveyor, quantity Resources: Tree surgeon, Family Support Financial Screen Referred: No Living Expenses: Own Money Management: Patient, Family Does the patient have any problems obtaining your medications?: No Home Management: Independent Patient/Family Preliminary Plans: Family members able to assist with cogntive tasks if needed. Care Coordinator Barriers to Discharge: Insurance for SNF coverage, Decreased caregiver support, Lack of/limited family support Care Coordinator Anticipated Follow Up Needs: HH/OP Expected length of stay: 7-10 days  Clinical Impression SW met with patient to discuss pending evaluations. Sw will follow up with patient and daughter will updates and determination of d/c date.   Andria Rhein 02/03/2023, 3:29 PM

## 2023-02-03 NOTE — Progress Notes (Signed)
Patient ID: Brooke Davis, female   DOB: 11/30/1940, 81 y.o.   MRN: [id number]  Team Conference Report to Patient/Family  Team Conference discussion was reviewed with the patient and caregiver, including goals, any changes in plan of care and target discharge date.  Patient and caregiver express understanding and are in agreement.  The patient has a target discharge date of  .   SW met with patient to discuss pending evaluations. Sw will follow up with patient and daughter will updates and determination of d/c date.  Andria Rhein 02/03/2023, 1:01 PM

## 2023-02-03 NOTE — Progress Notes (Signed)
Occupational Therapy Session Note  Patient Details  Name: Brooke Davis MRN: [id number] Date of Birth: 1941/04/22  Today's Date: 02/03/2023 OT Individual Time: 0926-1006 OT Individual Time Calculation (min): 40 min    Short Term Goals: Week 1:  OT Short Term Goal 1 (Week 1): STGs=LTGs due to patient's length of stay.  Skilled Therapeutic Interventions/Progress Updates:  Verbally communicated with OTR prior to session concerning OT goals. Pt greeted seated in w/c, pt agreeable to OT intervention. Clarified that pts THA was a posterior approach, reviewed posterior hip precautions with pt only initially able to recall 1/3. Transported pt to gym with total A for time mgmt. Attempted short distance functional mobility obstacle course however when pt stood pt reported increased pain in R hip needing to sit down.   Alerted RN for pain meds. D/t pain opted to work on seated [facility] task to further assess higher level cog.   Assessed using the Pill Box Test. Pt failed the assessment, demonstrating poor planning, mental flexibility, suboptimal search strategies, concrete thinking and the inability to multitask. Pt had > than 3 errors where more than 3 errors is considered a fail.   Transported pt back to room with total A with pt able to pivot back to bed with RW and MINA, pt required MOD A to return to supine needing assist to elevate BLEs back to bed. RN entered at end of session to provide pain meds. Ended session with pt supine in bed, bed alarm activated and all needs within reach.      Therapy Documentation Precautions:  Precautions Precautions: Posterior Hip Precaution Comments: mild R hemipareisis Restrictions Weight Bearing Restrictions: Yes RLE Weight Bearing: Weight bearing as tolerated  Pain:  Unrated pain reported in RLE, rest breaks provided as needed.    Therapy/Group: Individual Therapy  Pollyann Glen [facility] 02/03/2023, 12:14 PM

## 2023-02-04 DIAGNOSIS — I639 Cerebral infarction, unspecified: Secondary | ICD-10-CM | POA: Diagnosis not present

## 2023-02-04 NOTE — Progress Notes (Signed)
Physical Therapy Session Note  Patient Details  Name: Brooke Davis MRN: [id number] Date of Birth: [birth year]  Today's Date: 02/04/2023 PT Individual Time: 4132-4401; 1110 - 1205 PT Individual Time Calculation (min): 47 min; 55 min   Short Term Goals: Week 1:  PT Short Term Goal 1 (Week 1): STG = LTG d/t ELOS  SESSION 1 Skilled Therapeutic Interventions/Progress Updates: Patient handed off from NT assisting with personal care in bathroom on entrance to room. Patient alert and agreeable to PT session.   Patient reported no pain at beginning of session, but endorses slight tingling on R hip around surgical site. Pt able to recall 1/3 posterior hip precautions (no crossing B LE's) but stated "I would've remembered them if you hadn't asked me."  Therapeutic Activity: Transfers: Pt performed sit<>stand transfers throughout session with RW and with supervision for safety.   - TUG performed with pt performing with RW and finished outcome measure in 52 seconds - 5 x STS performed with pt finishing measure in 23 seconds  Gait Training:  Pt ambulated 36'x 2 in day room gym using RW with WC follow and CGA/supervision for safety. Pt demos decreased cadence, step length/clearance and elevated shoulders per excessive WB in B UE's per report of trying to avoid increasing low back and R hip pain. Pt cued to depress shoulders (until pain starts to increase) as to avoid straining upper traps. Pt reported SOB the first trial with SpO2 and HR WNL (cued to perform pursed lip breathing). Pt reported 5/10 pain after 2nd gait and requested pain medication at end of session. RN notified to provide. Pt also noted that pain went down when back in room to 3/10 but still wanted some pain medication as a precaution for the rest of morning therapy  Patient in [facility] at end of session with brakes locked, belt alarm set, and all needs within reach.  SESSION 2 Skilled Therapeutic Interventions/Progress Updates: Patient  sitting EOB on entrance to room. Patient alert and agreeable to PT session.   Patient reported 5/10 pain in R hip around surgical site. RN provided medication earlier in morning.   Therapeutic Activity: Bed Mobility: Pt performed sit to supine from EOB with modA (pt reported increased tightness in R LE quads). VC required for pt to lay on L side to elevate B LE's Transfers: Pt performed sit<>stand transfers throughout session with supervision and to RW/rollator. Provided VC for pt to ensure brakes are locked on rollator  Gait Training:  Pt ambulated 34' x 2 using rollator with CGA/supervision for safety. Pt instructed to avoid increasing WB on B UE's for safety, to depress B scapula's and to stay in close proximity to rollator Pt with increased fatigue reports and SOB (SpO2 WNL). Further ambulation deferred this session to avoid further straining patient's R LE's musculature in 2nd day of inpatient stay. PTA asked if pt would like a hot pack to place over R quads, pt agreed to give it a try at end of session when lying in bed.  Therapeutic Exercise: Pt performed the following exercises with therapist providing the described cuing and facilitation for improvement. - LAQ x 10 R LE, 2 x 10 L LE with 2.2lb ankle weight. Pt cued to control eccentric portion. Patient reported increase in R LE pain (unrated) and was instructed to only perform 2nd set on L LE.   Patient supine in bed at end of session with brakes locked, bed alarm set, hot pack on R quads (pt instructed to  avoid having pack near surgical site in R posterior hip) and all needs within reach.       Therapy Documentation Precautions:  Precautions Precautions: Posterior Hip Precaution Booklet Issued: No Precaution Comments: mild R hemipareisis vs pain limiting Restrictions Weight Bearing Restrictions: Yes RLE Weight Bearing: Weight bearing as tolerated  Therapy/Group: Individual Therapy  Brooke Davis PTA 02/04/2023, 9:02 AM

## 2023-02-04 NOTE — Progress Notes (Signed)
Occupational Therapy Session Note  Patient Details  Name: Brooke Davis MRN: [id number] Date of Birth: 03-15-1941  Today's Date: 02/04/2023 OT Individual Time: 6578-4696 OT Individual Time Calculation (min): 30 min    Short Term Goals: Week 1:  OT Short Term Goal 1 (Week 1): STGs=LTGs due to patient's length of stay.  Skilled Therapeutic Interventions/Progress Updates:  Pt greeted supine in bed, pt agreeable to OT intervention. Session focus on BADL reeducation, functional mobility, dynamic standing balance, AE training and decreasing overall caregiver burden.        Pt completed supine>sit with CGA using appropriate compensatory methods d/t posterior hip precautions. Reviewed hip precautions with pt able to state 2/3 today, provided functional examples to try to increase carryover of precautions. Of note, pt also asking for pain meds although nurse reports pt told SLP the same thing and pain meds had already been given.    Pt completed sit>stand with RW at [facility], ambulatory transfer to bathroom with RW and CGA. Lowered BSC over toilet for safety, pt completed 3/3 toileting tasks with MIN A needed A to pull pants to waist line. Discussed keeping reacher in bathroom to assist with pulling pants to waist line d/t hip precautions.   Pt exited bathroom with same level of assist with pt able to stand at sink for hand hygiene with supervision but noted to lean on sink. Practiced simulated LB dressing task with theraband placed at pts feet and pt given reacher to try to don theraband on BLEs to simulate pulling up pants at toilet. Pt needed + time and effort but able to complete task with CGA and MIN verbal cues for technique.                Ended session with pt seated EOB, bed alarm activated and all needs within reach.      Therapy Documentation Precautions:  Precautions Precautions: Posterior Hip Precaution Booklet Issued: No Precaution Comments: mild R hemipareisis vs pain  limiting Restrictions Weight Bearing Restrictions: Yes RLE Weight Bearing: Weight bearing as tolerated   Pain: 5/10 pain reported in R hips, RN provided pain meds during session, provided rest breaks as needed.    Therapy/Group: Individual Therapy  Pollyann Glen [facility] 02/04/2023, 12:24 PM

## 2023-02-04 NOTE — Progress Notes (Signed)
Patient ID: Brooke Davis, female   DOB: 06/09/1941, 82 y.o.   MRN: [id number] Met with the patient to review current situation, rehab process, team conference and plan of care. Discussed recent THR; staples to right hip, patient noted surgeon wants them to be left in for a little longer., continue WBAT.  Reviewed secondary risks including HF, A-fib on Eliquis, DM (A1C 6.1), HTN, HLD (LDL 46/TRig 55) on Lipitor along with HH diet. Anxious to go home; notes grand children will keep her active and notes preference for [facility] rather than OP follow up services. Forward preference to SW. Continue to follow along to address educational needs to facilitate preparation for discharge. Pamelia Hoit

## 2023-02-04 NOTE — Progress Notes (Signed)
Speech Language Pathology Daily Session Note  Patient Details  Name: Brooke Davis MRN: [id number] Date of Birth: 1941-05-14  Today's Date: 02/04/2023 SLP Individual Time: 0901-1001 SLP Individual Time Calculation (min): 60 min  Short Term Goals: Week 1: SLP Short Term Goal 1 (Week 1): STGs = LTGs 2* ELOS  Skilled Therapeutic Interventions: Skilled therapy session focused on cognitive re-training. SLP facilitated session by educating patient on WRAP (write, repeat, associate, picture) memory and word finding strategies. SLP provided min verbal A to review and utilize word finding strategies during word description tasks. Patient with improvement requiring supervision A by the end of the session. Patient able to independently recall 3/4 memory strategies after a ~20 minute delay. Patient reports pain in back and was transferred with walker to bed. RN notified. Patient left in bed with call bell and alarm set. Continue POC.   Pain Reports pain in back. Transferred to bed and notified RN.  Therapy/Group: Individual Therapy  Charlotta Lapaglia M.A., CF-SLP 02/04/2023, 12:44 PM

## 2023-02-04 NOTE — Plan of Care (Signed)
  Problem: RH Balance Goal: LTG Patient will maintain dynamic standing balance (PT) Description: LTG:  Patient will maintain dynamic standing balance with assistance during mobility activities (PT) Flowsheets (Taken 02/03/2023 1749) LTG: Pt will maintain dynamic standing balance during mobility activities with:: Independent with assistive device    Problem: Sit to Stand Goal: LTG:  Patient will perform sit to stand with assistance level (PT) Description: LTG:  Patient will perform sit to stand with assistance level (PT) Flowsheets (Taken 02/03/2023 1749) LTG: PT will perform sit to stand in preparation for functional mobility with assistance level: Independent with assistive device   Problem: RH Bed Mobility Goal: LTG Patient will perform bed mobility with assist (PT) Description: LTG: Patient will perform bed mobility with assistance, with/without cues (PT). Flowsheets (Taken 02/03/2023 1749) LTG: Pt will perform bed mobility with assistance level of: Independent with assistive device    Problem: RH Bed to Chair Transfers Goal: LTG Patient will perform bed/chair transfers w/assist (PT) Description: LTG: Patient will perform bed to chair transfers with assistance (PT). Flowsheets (Taken 02/03/2023 1749) LTG: Pt will perform Bed to Chair Transfers with assistance level: Independent with assistive device    Problem: RH Car Transfers Goal: LTG Patient will perform car transfers with assist (PT) Description: LTG: Patient will perform car transfers with assistance (PT). Flowsheets (Taken 02/03/2023 1749) LTG: Pt will perform car transfers with assist:: Contact Guard/Touching assist   Problem: RH Furniture Transfers Goal: LTG Patient will perform furniture transfers w/assist (OT/PT) Description: LTG: Patient will perform furniture transfers  with assistance (OT/PT). Flowsheets (Taken 02/03/2023 1749) LTG: Pt will perform furniture transfers with assist:: Supervision/Verbal cueing   Problem:  RH Ambulation Goal: LTG Patient will ambulate in controlled environment (PT) Description: LTG: Patient will ambulate in a controlled environment, # of feet with assistance (PT). Flowsheets (Taken 02/03/2023 1749) LTG: Pt will ambulate in controlled environ  assist needed:: Supervision/Verbal cueing LTG: Ambulation distance in controlled environment: 75 ft using LRAD Goal: LTG Patient will ambulate in home environment (PT) Description: LTG: Patient will ambulate in home environment, # of feet with assistance (PT). Flowsheets (Taken 02/03/2023 1749) LTG: Pt will ambulate in home environ  assist needed:: Supervision/Verbal cueing LTG: Ambulation distance in home environment: 50 ft using LRAD   Problem: RH Wheelchair Mobility Goal: LTG Patient will propel w/c in controlled environment (PT) Description: LTG: Patient will propel wheelchair in controlled environment, # of feet with assist (PT) Flowsheets (Taken 02/03/2023 1749) LTG: Pt will propel w/c in controlled environ  assist needed:: Supervision/Verbal cueing LTG: Propel w/c distance in controlled environment: at least 50 ft   Problem: RH Stairs Goal: LTG Patient will ambulate up and down stairs w/assist (PT) Description: LTG: Patient will ambulate up and down # of stairs with assistance (PT) Flowsheets (Taken 02/03/2023 1749) LTG: Pt will ambulate up/down stairs assist needed:: Contact Guard/Touching assist LTG: Pt will  ambulate up and down number of stairs: at least one step using LRAD in order to mobilize in community

## 2023-02-04 NOTE — Plan of Care (Signed)
  Problem: Consults Goal: RH STROKE PATIENT EDUCATION Description: See Patient Education module for education specifics  Outcome: Progressing   Problem: RH BOWEL ELIMINATION Goal: RH STG MANAGE BOWEL WITH ASSISTANCE Description: STG Manage Bowel with toileting Assistance. Outcome: Progressing   Problem: RH BLADDER ELIMINATION Goal: RH STG MANAGE BLADDER WITH ASSISTANCE Description: STG Manage Bladder With toileting Assistance Outcome: Progressing   Problem: RH SAFETY Goal: RH STG ADHERE TO SAFETY PRECAUTIONS W/ASSISTANCE/DEVICE Description: STG Adhere to Safety Precautions With cues Assistance/Device. Outcome: Progressing   Problem: RH PAIN MANAGEMENT Goal: RH STG PAIN MANAGED AT OR BELOW PT'S PAIN GOAL Description: < 4 with prns Outcome: Progressing   Problem: RH KNOWLEDGE DEFICIT Goal: RH STG INCREASE KNOWLEDGE OF DIABETES Description: Patient and family will be able to manage DM with medications and dietary modifications using educational resources independently Outcome: Progressing Goal: RH STG INCREASE KNOWLEDGE OF HYPERTENSION Description: Patient and family will be able to manage HTN with medications and dietary modifications using educational resources independently Outcome: Progressing Goal: RH STG INCREASE KNOWLEGDE OF HYPERLIPIDEMIA Description: Patient and family will be able to manage HLD with medications and dietary modifications using educational resources independently Outcome: Progressing Goal: RH STG INCREASE KNOWLEDGE OF STROKE PROPHYLAXIS Description: Patient and family will be able to manage secondary risks with medications and dietary modifications using educational resources independently Outcome: Progressing   Problem: Consults Goal: RH STROKE PATIENT EDUCATION Description: See Patient Education module for education specifics  Outcome: Progressing   Problem: Education: Goal: Knowledge of the prescribed therapeutic regimen will improve Outcome:  Progressing Goal: Understanding of discharge needs will improve Outcome: Progressing Goal: Individualized Educational Video(s) Outcome: Progressing   Problem: Activity: Goal: Ability to avoid complications of mobility impairment will improve Outcome: Progressing Goal: Ability to tolerate increased activity will improve Outcome: Progressing   Problem: Clinical Measurements: Goal: Postoperative complications will be avoided or minimized Outcome: Progressing   Problem: Pain Management: Goal: Pain level will decrease with appropriate interventions Outcome: Progressing   Problem: Skin Integrity: Goal: Will show signs of wound healing Outcome: Progressing   Problem: Education: Goal: Knowledge of disease or condition will improve Outcome: Progressing Goal: Knowledge of secondary prevention will improve (MUST DOCUMENT ALL) Outcome: Progressing Goal: Knowledge of patient specific risk factors will improve [name] N/A or DELETE if not current risk factor) Outcome: Progressing   Problem: Ischemic Stroke/TIA Tissue Perfusion: Goal: Complications of ischemic stroke/TIA will be minimized Outcome: Progressing   Problem: Coping: Goal: Will verbalize positive feelings about self Outcome: Progressing Goal: Will identify appropriate support needs Outcome: Progressing   Problem: Health Behavior/Discharge Planning: Goal: Ability to manage health-related needs will improve Outcome: Progressing Goal: Goals will be collaboratively established with patient/family Outcome: Progressing   Problem: Self-Care: Goal: Ability to participate in self-care as condition permits will improve Outcome: Progressing Goal: Verbalization of feelings and concerns over difficulty with self-care will improve Outcome: Progressing Goal: Ability to communicate needs accurately will improve Outcome: Progressing   Problem: Nutrition: Goal: Risk of aspiration will decrease Outcome: Progressing Goal: Dietary  intake will improve Outcome: Progressing

## 2023-02-04 NOTE — Progress Notes (Signed)
PROGRESS NOTE   Subjective/Complaints:  Walked ~30-40' with PT .  Mild DOE per PT but sats 99%  Per SLP eval no dysphagia, + mild aphasia  ROS- No CP, SOB, N/V/D  Objective:   DG Chest 2 View  Result Date: 02/02/2023 CLINICAL DATA:  Chest pain EXAM: CHEST - 2 VIEW COMPARISON:  02/18/2021 FINDINGS: The lungs are symmetrically well expanded. Small bilateral pleural effusions. Lungs are clear. No pneumothorax. Cardiac size is within normal limits. Pulmonary vascularity is normal. No acute bone abnormality. IMPRESSION: 1. Small bilateral pleural effusions. Electronically Signed   By: Helyn Numbers M.D.   On: 02/02/2023 22:05   Recent Labs    02/03/23 0612  WBC 9.9  HGB 8.2*  HCT 25.3*  PLT 366   Recent Labs    02/03/23 0612  NA 134*  K 3.6  CL 99  CO2 28  GLUCOSE 142*  BUN 22  CREATININE 1.23*  CALCIUM 8.6*    Intake/Output Summary (Last 24 hours) at 02/04/2023 0841 Last data filed at 02/03/2023 1738 Gross per 24 hour  Intake 720 ml  Output --  Net 720 ml        Physical Exam: Vital Signs Blood pressure 138/73, pulse 69, temperature 98.2 F (36.8 C), resp. rate 18, height 5\' 2"  (1.575 m), weight 83.6 kg, SpO2 97%.   General: No acute distress Mood and affect are appropriate Heart: Regular rate and rhythm no rubs murmurs or extra sounds Lungs: Clear to auscultation, breathing unlabored, no rales or wheezes Abdomen: Positive bowel sounds, soft nontender to palpation, nondistended Extremities: No clubbing, cyanosis, or edema Skin: No evidence of breakdown, no evidence of rash Neurologic: Cranial nerves II through XII intact, motor strength is 4/5 in bilateral deltoid, bicep, tricep, grip, hip flexor, knee extensors, ankle dorsiflexor and plantar flexor Sensory exam normal sensation to light touch  in bilateral upper and lower extremities Cerebellar exam normal finger to nose to finger Musculoskeletal: Full  range of motion in all 4 extremities. No joint swelling   Assessment/Plan: 1. Functional deficits which require 3+ hours per day of interdisciplinary therapy in a comprehensive inpatient rehab setting. Physiatrist is providing close team supervision and 24 hour management of active medical problems listed below. Physiatrist and rehab team continue to assess barriers to discharge/monitor patient progress toward functional and medical goals  Care Tool:  Bathing    Body parts bathed by patient: Right arm, Left arm, Chest, Abdomen, Front perineal area, Right upper leg, Left upper leg, Face   Body parts bathed by helper: Buttocks, Right lower leg, Left lower leg     Bathing assist Assist Level: Moderate Assistance - Patient 50 - 74%     Upper Body Dressing/Undressing Upper body dressing   What is the patient wearing?: Pull over shirt    Upper body assist Assist Level: Set up assist    Lower Body Dressing/Undressing Lower body dressing      What is the patient wearing?: Underwear/pull up, Pants     Lower body assist Assist for lower body dressing: Moderate Assistance - Patient 50 - 74%     Toileting Toileting Toileting Activity did not occur Press photographer and hygiene  only): N/A (no void or bm)  Toileting assist       Transfers Chair/bed transfer  Transfers assist     Chair/bed transfer assist level: Contact Guard/Touching assist     Locomotion Ambulation   Ambulation assist      Assist level: Contact Guard/Touching assist Assistive device: Walker-rolling Max distance: 20 ft   Walk 10 feet activity   Assist     Assist level: Contact Guard/Touching assist Assistive device: Walker-rolling   Walk 50 feet activity   Assist Walk 50 feet with 2 turns activity did not occur: Safety/medical concerns         Walk 150 feet activity   Assist Walk 150 feet activity did not occur: Safety/medical concerns         Walk 10 feet on uneven  surface  activity   Assist Walk 10 feet on uneven surfaces activity did not occur: Safety/medical concerns         Wheelchair     Assist Is the patient using a wheelchair?: Yes Type of Wheelchair: Manual    Wheelchair assist level: Dependent - Patient 0%      Wheelchair 50 feet with 2 turns activity    Assist        Assist Level: Dependent - Patient 0%   Wheelchair 150 feet activity     Assist      Assist Level: Dependent - Patient 0%   Blood pressure 138/73, pulse 69, temperature 98.2 F (36.8 C), resp. rate 18, height 5\' 2"  (1.575 m), weight 83.6 kg, SpO2 97%. Medical Problem List and Plan: 1. 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  Antithrombotics: -DVT/anticoagulation: Eliquis             -antiplatelet therapy: Aspirin 81 mg daily 3. Pain Management: Oxycodone/tramadol as needed- mainly taking tramadol prn- for R THP done 01/18/23 4. Mood/Behavior/Sleep: Celexa 20 mg daily, Klonopin 0.5 mg 3 times daily as needed             -antipsychotic agents: N/A 5. Neuropsych/cognition: This patient is capable of making decisions on her own behalf. 6. Skin/Wound Care: Routine skin checks 7. Fluids/Electrolytes/Nutrition: Routine in and outs with follow-up chemistries 8.  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 Francesco Sor.  Weightbearing as tolerated- honeycomb still in place- per pt, wanted to leave staples "a few more days". Posterior hip precautions  9.  Acute blood loss anemia after THA.  Follow-up CBC 10.  CAD/PCI and stenting.  Continue aspirin therapy 11.  Atrial fibrillation.  Maintained on Eliquis as well as Lopressor 25 mg twice daily.  Cardiac rate controlled.  Follow-up Dr.Shaukat Delbert [place].  Diastolic congestive heart failure.  Demadex 20  mg daily, Entresto 24-26 mg twice daily.  Monitor for any signs of fluid overload 13.  Hyperlipidemia.  Lipitor 14.  History of gout.  Zyloprim 300 mg daily.  Monitor for any gout flareups 15.  Type 2 diabetes mellitus.  Hemoglobin A1c 6.1.  Patient on no medication prior to admission.  CBGs discontinued. 16.  Bilateral carotid stenosis.  Follow-up outpatient vascular surgery Dr. Wyn Quaker     LOS: 2 days A FACE TO FACE EVALUATION WAS PERFORMED  Brooke Davis  Brooke Davis 02/04/2023, 8:41 AM

## 2023-02-04 NOTE — Plan of Care (Signed)
Patient progressing towards goals.

## 2023-02-05 DIAGNOSIS — I69320 Aphasia following cerebral infarction: Secondary | ICD-10-CM

## 2023-02-05 DIAGNOSIS — Z96641 Presence of right artificial hip joint: Secondary | ICD-10-CM

## 2023-02-05 NOTE — Progress Notes (Signed)
Per Dr. Wynn Banker remove every other staple and place steri strips.  Saturday 7/20 remove the rest of the staples and place steri strips. Patient tolerated well. No bleeding noted at this time

## 2023-02-05 NOTE — Plan of Care (Signed)
Progressing towards goals

## 2023-02-05 NOTE — Progress Notes (Signed)
Speech Language Pathology Daily Session Note  Patient Details  Name: Brooke Davis MRN: [id number] Date of Birth: [birth year]  Today's Date: 02/05/2023 SLP Individual Time: 1002-1102 SLP Individual Time Calculation (min): 60 min  Short Term Goals: Week 1: SLP Short Term Goal 1 (Week 1): STGs = LTGs 2* ELOS  Skilled Therapeutic Interventions: Skilled therapy session focused on cognitive goals. SLP targeted goals by reviewing memory and word finding strategies at the beginning of the session. SLP addressed complex wording finding through word description  and phrase completion tasks. Patient required supervision A to complete these activities. During categorical naming, patient able to name ~5 words per category in one minute before demonstrating frustration stating "my mind just goes blank." SLP provided encouragement and recommendations to utilize prior life experiences to recall more words in each category. SLP provided examples such as "when naming vegetables, think of ingredients necessary for vegetable soup." These recommendations increased patients responses in further naming tasks. At the end of the session, patient requested to use the bathroom. Patient continent of urine. Patient was left in bed with alarm set and call bell in reach. Continue with POC.   Pain Denies Pain  Therapy/Group: Individual Therapy  Zienna Ahlin M.A., CF-SLP 02/05/2023, 12:29 PM

## 2023-02-05 NOTE — Progress Notes (Addendum)
Physical Therapy Session Note  Patient Details  Name: Brooke Davis MRN: [id number] Date of Birth: [birth year]  Today's Date: 02/05/2023 PT Individual Time: 2957-4734 PT Individual Time Calculation (min): 71 min   Short Term Goals: Week 1:  PT Short Term Goal 1 (Week 1): STG = LTG d/t ELOS  Skilled Therapeutic Interventions/Progress Updates: Patient supine in bed on entrance to room. Patient alert and agreeable to PT session.   Patient reported light pain in R hip around surgical site, but that it wasn't bad (unrated). Pt also reported having tightness in R hip flexors (therex performed to decrease tightness this session).  Therapeutic Activity: Bed Mobility: Pt performed supine<>sit on EOB with supervision for safety. No VC required.  Transfers: Pt performed sit<>stand transfers throughout session with rollator with supervision for safety. Provided VC for use of one UE to push and the other on rollator. Pt also required max cues to ensure locking system is engaged in rollator when standing or sitting.  Gait Training:  Pt ambulated from room and shortly past nursing station and required 2 rest breaks (roughly 40' x 1, and 35' x 1 with extended rest breaks required) using rollator with supervisor/CGA for safety. Pt with decreased cadence/length/and clearance. Pt cued to increase B step clearance per presentation of sliding B feet (R>L). Pt also cued to depress B shoulders and to decrease WB on B UE on rollators (mod-max cues) and to keep rollator in safe proximity.   Therapeutic Exercise: Pt performed the following exercises with therapist providing the described cuing and facilitation for improvement. - Rip flexor stretch standing in // bars with cues to perform slight lunge forward with L LE (cues to bring knees over toes). Pt with CGA for safety and tactile cuing to posteriorly tilt pelvis and stand upright. Pt provided with demonstration of sequence, and performed several rounds of  stretching with rest break at end.  - L sidelying on hi/low mat with PTA passively stretching R LE targeting hip flexors and quads. 4 rounds of 3 second holds.  Patient supine in bed at end of session with brakes locked, bed alarm set, and all needs within reach.      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

## 2023-02-05 NOTE — Progress Notes (Signed)
Physical Therapy Session Note  Patient Details  Name: Brooke Davis MRN: [id number] Date of Birth: 03/07/1941  Today's Date: 02/05/2023 PT Individual Time: 1447-1530 PT Individual Time Calculation (min): 43 min   Short Term Goals: Week 1:  PT Short Term Goal 1 (Week 1): STG = LTG d/t ELOS  Skilled Therapeutic Interventions/Progress Updates: Pt presents supine in bed and agreeable to therapy.  Pt transfers sup to sit w/ supervision, but pulls up from siderail and R LE IR.  Pt educated on THR precautions.  Pt transfers sit to stand w/ close supervision to rollator.  Pt amb x 100' before fatiguing.  Pt requires verbal cues for safe positioning of rollator against wall, brakes and turning to sit.  Pt amb x 45' into dayroom and again cueing for safe positioning at mat table and applying brakes before sitting.  Pt performed standing cornhole toss alternating UES as well as w/ step R foot to throw.  Pt states fatigue.  Pt transferred SPT to w/c and wheeled to sink.  Pt stood to brush teeth.  Pt amb to bed w/ RW and close supervision.  Pt returned to bed w/ min A for RLE.  Pt pulls R LE into bed but again does not follow THR precautions, moving too quickly.  Bed alarm on and all needs in reach.     Therapy Documentation Precautions:  Precautions Precautions: Posterior Hip Precaution Booklet Issued: No Precaution Comments: mild R hemipareisis vs pain limiting Restrictions Weight Bearing Restrictions: Yes RLE Weight Bearing: Weight bearing as tolerated General:   Vital Signs: Therapy Vitals Temp: 98.1 F (36.7 C) Temp Source: Oral Pulse Rate: 68 Resp: 16 BP: (!) 120/47 Patient Position (if appropriate): Lying Oxygen Therapy SpO2: 99 % O2 Device: Room Air Pain: c/o soreness R hip, but unable to quantify.       Therapy/Group: Individual Therapy  Lucio Edward 02/05/2023, 4:04 PM

## 2023-02-05 NOTE — Progress Notes (Addendum)
PROGRESS NOTE   Subjective/Complaints:  No hip pain , discussed staple removal with pt and Nsg, Right THR 01/18/23  Per SLP eval no dysphagia, + mild aphasia  ROS- No CP, SOB, N/V/D  Objective:   No results found. Recent Labs    02/03/23 0612  WBC 9.9  HGB 8.2*  HCT 25.3*  PLT 366   Recent Labs    02/03/23 0612  NA 134*  K 3.6  CL 99  CO2 28  GLUCOSE 142*  BUN 22  CREATININE 1.23*  CALCIUM 8.6*    Intake/Output Summary (Last 24 hours) at 02/05/2023 0905 Last data filed at 02/05/2023 0811 Gross per 24 hour  Intake 932 ml  Output --  Net 932 ml        Physical Exam: Vital Signs Blood pressure (!) 153/76, pulse 80, temperature 97.9 F (36.6 C), resp. rate 18, height 5\' 2"  (1.575 m), weight 83.6 kg, SpO2 98%.   General: No acute distress Mood and affect are appropriate Heart: Regular rate and rhythm no rubs murmurs or extra sounds Lungs: Clear to auscultation, breathing unlabored, no rales or wheezes Abdomen: Positive bowel sounds, soft nontender to palpation, nondistended Extremities: No clubbing, cyanosis, or edema Skin: No evidence of breakdown, no evidence of rash erythema around staples but incision intact without drainage or blood Neurologic: Cranial nerves II through XII intact, motor strength is 4/5 in bilateral deltoid, bicep, tricep, grip, hip flexor, knee extensors, ankle dorsiflexor and plantar flexor Sensory exam normal sensation to light touch  in bilateral upper and lower extremities Cerebellar exam normal finger to nose to finger Musculoskeletal: Full range of motion in all 4 extremities. No joint swelling   Assessment/Plan: 1. Functional deficits which require 3+ hours per day of interdisciplinary therapy in a comprehensive inpatient rehab setting. Physiatrist is providing close team supervision and 24 hour management of active medical problems listed below. Physiatrist and rehab team  continue to assess barriers to discharge/monitor patient progress toward functional and medical goals  Care Tool:  Bathing    Body parts bathed by patient: Right arm, Left arm, Chest, Abdomen, Front perineal area, Right upper leg, Left upper leg, Face   Body parts bathed by helper: Buttocks, Right lower leg, Left lower leg     Bathing assist Assist Level: Moderate Assistance - Patient 50 - 74%     Upper Body Dressing/Undressing Upper body dressing   What is the patient wearing?: Pull over shirt    Upper body assist Assist Level: Set up assist    Lower Body Dressing/Undressing Lower body dressing      What is the patient wearing?: Underwear/pull up, Pants     Lower body assist Assist for lower body dressing: Moderate Assistance - Patient 50 - 74%     Toileting Toileting Toileting Activity did not occur Press photographer and hygiene only): N/A (no void or bm)  Toileting assist Assist for toileting: Minimal Assistance - Patient > 75%     Transfers Chair/bed transfer  Transfers assist     Chair/bed transfer assist level: Contact Guard/Touching assist     Locomotion Ambulation   Ambulation assist      Assist level: Contact Guard/Touching  assist Assistive device: Walker-rolling Max distance: 20 ft   Walk 10 feet activity   Assist     Assist level: Contact Guard/Touching assist Assistive device: Walker-rolling   Walk 50 feet activity   Assist Walk 50 feet with 2 turns activity did not occur: Safety/medical concerns         Walk 150 feet activity   Assist Walk 150 feet activity did not occur: Safety/medical concerns         Walk 10 feet on uneven surface  activity   Assist Walk 10 feet on uneven surfaces activity did not occur: Safety/medical concerns         Wheelchair     Assist Is the patient using a wheelchair?: Yes Type of Wheelchair: Manual    Wheelchair assist level: Dependent - Patient 0%      Wheelchair 50  feet with 2 turns activity    Assist        Assist Level: Dependent - Patient 0%   Wheelchair 150 feet activity     Assist      Assist Level: Dependent - Patient 0%   Blood pressure (!) 153/76, pulse 80, temperature 97.9 F (36.6 C), resp. rate 18, height 5\' 2"  (1.575 m), weight 83.6 kg, SpO2 98%. Medical Problem List and Plan: 1. 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  Antithrombotics: -DVT/anticoagulation: Eliquis             -antiplatelet therapy: Aspirin 81 mg daily 3. Pain Management: Oxycodone/tramadol as needed- mainly taking tramadol prn- for R THP done 01/18/23 4. Mood/Behavior/Sleep: Celexa 20 mg daily, Klonopin 0.5 mg 3 times daily as needed             -antipsychotic agents: N/A 5. Neuropsych/cognition: This patient is capable of making decisions on her own behalf. 6. Skin/Wound Care: Routine skin checks remove 1/2 R hip staples today and 1/2 in am , steristrip in between  7. Fluids/Electrolytes/Nutrition: Routine in and outs with follow-up chemistries 8.  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 Francesco Sor.  Weightbearing as tolerated- honeycomb still in place- per pt, wanted to leave staples "a few more days". Posterior hip precautions  9.  Acute blood loss anemia after THA.  Follow-up CBC 10.  CAD/PCI and stenting.  Continue aspirin therapy 11.  Atrial fibrillation.  Maintained on Eliquis as well as Lopressor 25 mg twice daily.  Cardiac rate controlled.  Follow-up Dr.Shaukat Delbert [place].  Diastolic congestive heart failure.  Demadex 20 mg daily, Entresto 24-26 mg twice daily.  Monitor for any signs of fluid overload 13.  Hyperlipidemia.  Lipitor 14.  History of gout.  Zyloprim 300 mg daily.  Monitor for any gout flareups 15.  Type 2 diabetes mellitus.   Hemoglobin A1c 6.1.  Patient on no medication prior to admission.  CBGs discontinued. 16.  Bilateral carotid stenosis.  Follow-up outpatient vascular surgery Dr. Wyn Quaker     LOS: 3 days A FACE TO FACE EVALUATION WAS PERFORMED  Erick Colace 02/05/2023, 9:05 AM

## 2023-02-05 NOTE — IPOC Note (Signed)
Overall Plan of Care [facility]) Patient Details Name: Brooke Davis MRN: [id number] DOB: 01-Apr-1941  Admitting Diagnosis: Embolic cerebral infarction [facility])  Hospital Problems: Principal Problem:   Embolic cerebral infarction [facility])     Functional Problem List: Nursing Endurance, Medication Management, Safety, Pain, Bowel, Bladder  PT Balance, Edema, Endurance, Motor, Pain, Perception, Safety, Sensory, Skin Integrity  OT Balance, Cognition, Endurance, Motor, Pain, Perception, Safety  SLP Linguistic  TR         Basic ADL's: OT Grooming, Bathing, Dressing, Toileting     Advanced  ADL's: OT       Transfers: PT Bed Mobility, Bed to Chair, Car, Occupational psychologist, Research scientist (life sciences): PT Ambulation, Educational psychologist, Psychologist, prison and probation services     Additional Impairments: OT Fuctional Use of Upper Extremity  SLP Communication comprehension, expression    TR      Anticipated Outcomes Item Anticipated Outcome  Self Feeding    Swallowing      Basic self-care  Mod I  Toileting  Mod I   Bathroom Transfers Mod I  Bowel/Bladder  manage bowel w mod I and bladder w toileting  Transfers  Mod I  Locomotion  supervision  Communication  modI  Cognition  modI  Pain  < 4 with prns  Safety/Judgment  manage w cues   Therapy Plan: PT Intensity: Minimum of 1-2 x/day ,45 to 90 minutes PT Frequency: 5 out of 7 days PT Duration Estimated Length of Stay: 7 days OT Intensity: Minimum of 1-2 x/day, 45 to 90 minutes OT Frequency: 5 out of 7 days OT Duration/Estimated Length of Stay: 7-10 days SLP Intensity: Minumum of 1-2 x/day, 30 to 90 minutes SLP Frequency: 1 to 3 out of 7 days SLP Duration/Estimated Length of Stay: 7-10 days   Team Interventions: Nursing Interventions Disease Management/Prevention, Bladder Management, Medication Management, Discharge Planning, Bowel Management, Pain Management, Patient/Family Education  PT interventions Ambulation/gait training, Community  reintegration, DME/adaptive equipment instruction, Neuromuscular re-education, Psychosocial support, Stair training, UE/LE Strength taining/ROM, Wheelchair propulsion/positioning, Warden/ranger, Discharge planning, Pain management, Skin care/wound management, Therapeutic Activities, UE/LE Coordination activities, Cognitive remediation/compensation, Disease management/prevention, Functional mobility training, Patient/family education, Splinting/orthotics, Therapeutic Exercise, Visual/perceptual remediation/compensation  OT Interventions Warden/ranger, Cognitive remediation/compensation, Community reintegration, Discharge planning, Disease mangement/prevention, DME/adaptive equipment instruction, Functional electrical stimulation, Functional mobility training, Neuromuscular re-education, Pain management, Patient/family education, Self Care/advanced ADL retraining, Splinting/orthotics, Therapeutic Activities, Therapeutic Exercise, UE/LE Strength taining/ROM, UE/LE Coordination activities  SLP Interventions Cueing hierarchy, Cognitive remediation/compensation, Therapeutic Activities, Patient/family education, Internal/external aids, Speech/Language facilitation  TR Interventions    SW/CM Interventions Discharge Planning, Psychosocial Support, Patient/Family Education, Disease Management/Prevention   Barriers to Discharge MD  Medical stability  Nursing Decreased caregiver support, Weight bearing restrictions 1 level ramped entry w grand children; assist for IADLs from family due to hip pain prior to hip surgery; generally MOD I for ADLs, assist for LB dressing after hip surgery, has AOZ:HYQMVH riser, BSC/3in1, Rollator (4 wheels), Wheelchair - manual, Rolling Walker (2 wheels), Grab bars - toilet, Grab bars - tub/shower, Information systems manager, Other (comment)   Lives With: Family (grandson and Art gallery manager)  PT Decreased caregiver support, Wound Care, Insurance for SNF coverage, Weight  bearing restrictions    OT Weight bearing restrictions    SLP      SW Insurance for SNF coverage, Decreased caregiver support, Lack of/limited family support     Team Discharge Planning: Destination: PT-Home ,OT- Home , SLP-Home Projected Follow-up: PT-Outpatient PT, 24 hour supervision/assistance, OT-  Outpatient OT,  SLP-Home Health SLP Projected Equipment Needs: PT-To be determined, OT- To be determined, SLP-None recommended by SLP Equipment Details: PT- , OT-  Patient/family involved in discharge planning: PT- Patient,  OT-Patient, SLP-Patient  MD ELOS: 7-10d Medical Rehab Prognosis:  Good Assessment: The patient has been admitted for CIR therapies with the diagnosis of embolic cerebral infarction. The team will be addressing functional mobility, strength, stamina, balance, safety, adaptive techniques and equipment, self-care, bowel and bladder mgt, patient and caregiver education, wound care and hip prec after THA 01/18/23. Goals have been set at mod I /Sup. Anticipated discharge destination is Home.        See Team Conference Notes for weekly updates to the plan of care

## 2023-02-05 NOTE — Progress Notes (Signed)
Occupational Therapy Session Note  Patient Details  Name: Brooke Davis MRN: [id number] Date of Birth: 17-Jul-1941  Today's Date: 02/05/2023 OT Individual Time: 1403-1430 OT Individual Time Calculation (min): 27 min    Short Term Goals: Week 1:  OT Short Term Goal 1 (Week 1): STGs=LTGs due to patient's length of stay.  Skilled Therapeutic Interventions/Progress Updates:  Pt greeted asleep in supine, pt agreeable to OT intervention. Session focus on BADL reeducation, functional mobility, dynamic standing balance, AE education and decreasing overall caregiver burden.                     Pt completed supine>sit with use of bed features with supervision, stand pivot transfer to w/c with rollator and supervision. Pt completed wash up at sink with CGA for sit>stand to wash LB. Pt reports tub shower at home and already has TTB.   Pt donned [place] shirt with set- up assist, MIN A for LB dressing as reacher unavailable at this time. Education provided on use of sock aid to don socks, pt reports familiarity with AE.   Pt able to ambulate to bathroom with rollator and CGA. Pt completed 3/3 toileting tasks with CGA.  Ended session with pt supine in bed with all needs within reach and bed alarm activated.                    Therapy Documentation Precautions:  Precautions Precautions: Posterior Hip Precaution Booklet Issued: No Precaution Comments: mild R hemipareisis vs pain limiting Restrictions Weight Bearing Restrictions: Yes RLE Weight Bearing: Weight bearing as tolerated   Pain: unrated pain reported in R hip, rest breaks provided as needed.     Therapy/Group: Individual Therapy  Pollyann Glen [facility] 02/05/2023, 3:58 PM

## 2023-02-06 DIAGNOSIS — M25551 Pain in right hip: Secondary | ICD-10-CM

## 2023-02-06 DIAGNOSIS — I6930 Unspecified sequelae of cerebral infarction: Secondary | ICD-10-CM

## 2023-02-06 NOTE — Progress Notes (Signed)
Speech Language Pathology Daily Session Note  Patient Details  Name: Brooke Davis MRN: [id number] Date of Birth: 1941-01-30  Today's Date: 02/06/2023 SLP Individual Time: 0830-0930 SLP Individual Time Calculation (min): 60 min  Short Term Goals: Week 1: SLP Short Term Goal 1 (Week 1): STGs = LTGs 2* ELOS  Skilled Therapeutic Interventions:  Patient was seen for skilled ST services targeting language.  Patient was sitting in bed upon SLP arrival and was agreeable to therapy.  Patient was unable to recall what she was working on in ST. SLP reviewed goals with pt regarding reduced understanding and word finding. SLP edu on asking for repetition/clarification from others if she had difficulty understanding re: "Here is what I heard ______, is that would you said?" SLP wrote this information down and placed it on bedside table.   SLP edu on "describe" word finding strategy, as SLP suspects this would be most likely used by patient as she already demonstrates some circumlocution. We completed SFA with basic, personally-relevant objects (ginger ale, ranch chips). SLP asked pt to identify most salient strategy re: "Which is the most OBVIOUS feature?" Pt required minA to identify most salient. SLP encouraged pt to select an item in the room and describe it. Pt chose clock and Coke. She was able to provide 3 relevant features with prompting questions, but would benefit from continued work on most salient features.   Pt was left in room, all immediate needs within reach, and all safety measures activated. Continue with current ST POC.    Pain Pain Assessment Pain Score: 0-No pain  Therapy/Group: Individual Therapy  Dorena Bodo 02/06/2023, 12:05 PM

## 2023-02-06 NOTE — Progress Notes (Signed)
Physical Therapy Session Note  Patient Details  Name: Brooke Davis MRN: [id number] Date of Birth: [birth year]  Today's Date: 02/06/2023 PT Individual Time: 1053-1204 PT Individual Time Calculation (min): 71 min   Short Term Goals: Week 1:  PT Short Term Goal 1 (Week 1): STG = LTG d/t ELOS  Skilled Therapeutic Interventions/Progress Updates: Patient with NT ambulating to the bathroom for personal assistance on entrance to room. Patient alert and agreeable to PT session.   Patient reported no pain, but endorses soreness in R hip flexors after stretching yesterday (not as bad this morning), and would like to continue stretching. Ambulation performed outside to boost pt morale.   Therapeutic Activity: Transfers: Pt performed sit<>stand transfers throughout session with no AD (HHA) and with minA for safety (pt with step to pattern and notable antalgive steps on R LE).  Gait Training:  Pt ambulated roughly 80' x 2 on non-compliant surfaces outside using rollator with CGA for safety. pt required a rest break and was cued to depress shoulders and to increase step clearance on R LE. Pt   Neuromuscular Re-ed: NMR facilitated during session with focus on increasing WB. - Attempted pt to perform anterior steps with L LE and with HHA, but pt unable to step 2/2 reports of fear from not having adequate stability with WB on R LE. Pt noted that they will likely become WC dependent because of being 82 years old, and because of R LE. Pt consoled and educated that fearing putting WB on R LE can increase risks of falling, and was encouraged to attempt in future sessions as to increase B LE stability.  - nustep @ level 7 at first for 3 minutes with instructions to only use B LE's, and to keep steps per minute around 20. Resistance then decreased to lvl 3 for 5 minutes. Pt required a rest break at the 5 minute mark (no reports of SOB, just fatigued in B LE's). Pt also cued to keep R knee from buckling into  internal rotation.  NMR performed for improvements in motor control and coordination, balance, sequencing, judgement, and self confidence/ efficacy in performing all aspects of mobility at highest level of independence.   Patient in [facility] at end of session with brakes locked, belt alarm set, and all needs within reach.      Therapy Documentation Precautions:  Precautions Precautions: Posterior Hip Precaution Booklet Issued: No Precaution Comments: mild R hemipareisis vs pain limiting Restrictions Weight Bearing Restrictions: Yes RLE Weight Bearing: Weight bearing as tolerated  Therapy/Group: Individual Therapy  Fareeda Downard PTA 02/06/2023, 12:26 PM

## 2023-02-06 NOTE — Progress Notes (Signed)
Staples removed per order, steri stirps applied. Patient tolerated well. Denies pain or discomfort at this time

## 2023-02-06 NOTE — Progress Notes (Signed)
PROGRESS NOTE   Subjective/Complaints:  No events overnight.  Some mild pain from right hip, but tolerable.  Staples were partially removed yesterday, patient is anxious regarding some discharge from the hip, but denies any infectious symptoms. Vitals stable - mild diastolic hypotension 40s; asymptomatic Last BM 7/17   ROS: Denies fevers, chills, N/V, abdominal pain, constipation, diarrhea, SOB, cough, chest pain, new weakness or paraesthesias.    Objective:   No results found. No results for input(s): "WBC", "HGB", "HCT", "PLT" in the last 72 hours.  No results for input(s): "NA", "K", "CL", "CO2", "GLUCOSE", "BUN", "CREATININE", "CALCIUM" in the last 72 hours.   Intake/Output Summary (Last 24 hours) at 02/06/2023 0945 Last data filed at 02/06/2023 0824 Gross per 24 hour  Intake 1090 ml  Output --  Net 1090 ml        Physical Exam: Vital Signs Blood pressure (!) 120/46, pulse 68, temperature 98.2 F (36.8 C), temperature source Oral, resp. rate 16, height 5\' 2"  (1.575 m), weight 83.6 kg, SpO2 99%.   General: No acute distress Mood and affect are appropriate Heart: Regular rate and rhythm no rubs murmurs or extra sounds Lungs: Clear to auscultation, breathing unlabored, no rales or wheezes Abdomen: Positive bowel sounds, soft nontender to palpation, nondistended Extremities: No clubbing, cyanosis, or edema Skin:  + Right hip incision with mild serous drainage on bandaging, mid incision some very small areas where appears wound edges have not completely come together, otherwise wound clean/dry/intact with combination of Steri-Strips and staples.  Neurologic: Cranial nerves II through XII intact, Strength 5 out of 5 throughout bilateral upper and left lower extremity, 3-5 left hip flexion, 4 out of 5 knee extension limited by pain. Sensory exam normal sensation to light touch  in bilateral upper and lower  extremities Cerebellar exam normal finger to nose to finger Musculoskeletal: Full range of motion in all 4 extremities. No joint swelling   Assessment/Plan: 1. Functional deficits which require 3+ hours per day of interdisciplinary therapy in a comprehensive inpatient rehab setting. Physiatrist is providing close team supervision and 24 hour management of active medical problems listed below. Physiatrist and rehab team continue to assess barriers to discharge/monitor patient progress toward functional and medical goals  Care Tool:  Bathing    Body parts bathed by patient: Right arm, Left arm, Chest, Abdomen, Front perineal area, Right upper leg, Left upper leg, Face   Body parts bathed by helper: Buttocks, Right lower leg, Left lower leg     Bathing assist Assist Level: Moderate Assistance - Patient 50 - 74%     Upper Body Dressing/Undressing Upper body dressing   What is the patient wearing?: Pull over shirt    Upper body assist Assist Level: Set up assist    Lower Body Dressing/Undressing Lower body dressing      What is the patient wearing?: Underwear/pull up, Pants     Lower body assist Assist for lower body dressing: Moderate Assistance - Patient 50 - 74%     Toileting Toileting Toileting Activity did not occur (Clothing management and hygiene only): N/A (no void or bm)  Toileting assist Assist for toileting: Minimal Assistance - Patient > 75%  Transfers Chair/bed transfer  Transfers assist     Chair/bed transfer assist level: Contact Guard/Touching assist     Locomotion Ambulation   Ambulation assist      Assist level: Supervision/Verbal cueing Assistive device: Rollator Max distance: 100   Walk 10 feet activity   Assist     Assist level: Supervision/Verbal cueing Assistive device: Rollator   Walk 50 feet activity   Assist Walk 50 feet with 2 turns activity did not occur: Safety/medical concerns  Assist level: Supervision/Verbal  cueing Assistive device: Rollator    Walk 150 feet activity   Assist Walk 150 feet activity did not occur: Safety/medical concerns         Walk 10 feet on uneven surface  activity   Assist Walk 10 feet on uneven surfaces activity did not occur: Safety/medical concerns         Wheelchair     Assist Is the patient using a wheelchair?: Yes Type of Wheelchair: Manual    Wheelchair assist level: Dependent - Patient 0%      Wheelchair 50 feet with 2 turns activity    Assist        Assist Level: Dependent - Patient 0%   Wheelchair 150 feet activity     Assist      Assist Level: Dependent - Patient 0%   Blood pressure (!) 120/46, pulse 68, temperature 98.2 F (36.8 C), temperature source Oral, resp. rate 16, height 5\' 2"  (1.575 m), weight 83.6 kg, SpO2 99%. Medical Problem List and Plan: 1. 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  Antithrombotics: -DVT/anticoagulation: Eliquis             -antiplatelet therapy: Aspirin 81 mg daily 3. Pain Management: Oxycodone/tramadol as needed- mainly taking tramadol prn- for R THP done 01/18/23 4. Mood/Behavior/Sleep: Celexa 20 mg daily, Klonopin 0.5 mg 3 times daily as needed             -antipsychotic agents: N/A 5. Neuropsych/cognition: This patient is capable of making decisions on her own behalf. 6. Skin/Wound Care: Routine skin checks remove 1/2 R hip staples today and 1/2 in am , steristrip in between    - 7-20: Ordered to nursing to remove remainder staples, request Steri-Strips approximately every 1/8 inch to support areas of mild drainage for wound edges have not yet come together.  7. Fluids/Electrolytes/Nutrition: Routine in and outs with follow-up chemistries 8.  Right total hip arthroplasty  01/18/2023 per Dr. Francesco Sor.  Weightbearing as tolerated- honeycomb still in place- per pt, wanted to leave staples "a few more days". Posterior hip precautions   9.  Acute blood loss anemia after THA.  Follow-up CBC 10.  CAD/PCI and stenting.  Continue aspirin therapy 11.  Atrial fibrillation.  Maintained on Eliquis as well as Lopressor 25 mg twice daily.  Cardiac rate controlled.  Follow-up Dr.Shaukat Delbert [place].  Diastolic congestive heart failure.  Demadex 20 mg daily, Entresto 24-26 mg twice daily.  Monitor for any signs of fluid overload Filed Weights   02/02/23 1624  Weight: 83.6 kg   -No signs of fluid overload, mild diastolic hypotension today, monitor.  13.  Hyperlipidemia.  Lipitor 14.  History of gout.  Zyloprim 300 mg daily.  Monitor for any gout flareups 15.  Type 2 diabetes mellitus.  Hemoglobin A1c 6.1.  Patient on no medication prior to admission.  CBGs discontinued. 16.  Bilateral carotid stenosis.  Follow-up outpatient vascular surgery Dr. Wyn Quaker     LOS: 4 days A FACE TO FACE EVALUATION WAS PERFORMED  Brooke Davis 02/06/2023, 9:45 AM

## 2023-02-06 NOTE — Progress Notes (Signed)
Occupational Therapy Session Note  Patient Details  Name: Brooke Davis MRN: [id number] Date of Birth: [birth year]  Today's Date: 02/06/2023 OT Individual Time: 1245-1330 OT Individual Time Calculation (min): 45 min    Short Term Goals: Week 1:  OT Short Term Goal 1 (Week 1): STGs=LTGs due to patient's length of stay.  Skilled Therapeutic Interventions/Progress Updates:  Pt received resting in bed for skilled OT session with focus on BADL retraining. Pt agreeable to interventions, demonstrating overall pleasant mood. Pt with no reports of pain, but endorsing increased level of fatigue. OT offering intermediate rest breaks and positioning suggestions throughout session to address pain/fatigue and maximize participation/safety in session.   Pt performs bed mobility with increased time for RLE management. Pt ambulates from EOB<>bathroom with use of RW + supervision, pt deferring use of rollator this session, stating "It's not steady. . . .". Pt performs TTB transfer with same level of assistance, re-education provided on use of reacher and sock-aid for LB dressing. Pt requires increased levels of cuing for posterior hip precaution adherence, during bathing, would benefit from long-handled sponge. Pt issued handout of sock-aid and leg lifter, to be purchased at patient's discretion.   Pt remained resting in bed with all immediate needs met at end of session. Pt misses ~15 minutes of skilled intervention due to fatigue.   Pt continues to be appropriate for skilled OT intervention to promote further functional independence.   Therapy Documentation Precautions:  Precautions Precautions: Posterior Hip Precaution Booklet Issued: No Precaution Comments: mild R hemipareisis vs pain limiting Restrictions Weight Bearing Restrictions: Yes RLE Weight Bearing: Weight bearing as tolerated   Therapy/Group: Individual Therapy  Lou Cal, OTR/L, MSOT  02/06/2023, 5:40 AM

## 2023-02-06 NOTE — Plan of Care (Signed)
Progressing towards goals

## 2023-02-07 NOTE — Plan of Care (Signed)
  Problem: Consults Goal: RH STROKE PATIENT EDUCATION Description: See Patient Education module for education specifics  Outcome: Progressing   Problem: RH PAIN MANAGEMENT Goal: RH STG PAIN MANAGED AT OR BELOW PT'S PAIN GOAL Description: < 4 with prns Outcome: Progressing   Problem: RH KNOWLEDGE DEFICIT Goal: RH STG INCREASE KNOWLEDGE OF DIABETES Description: Patient and family will be able to manage DM with medications and dietary modifications using educational resources independently Outcome: Progressing

## 2023-02-07 NOTE — Progress Notes (Signed)
PROGRESS NOTE   Subjective/Complaints:  No events overnight.  No acute complaints.  Discussed dispo planning; patient has grandchildren at home who "get lost without me", asking for early discharge. Told her to discuss with primary team in AM, she is agreeable.    ROS: Denies fevers, chills, N/V, abdominal pain, constipation, diarrhea, SOB, cough, chest pain, new weakness or paraesthesias.    Objective:   No results found. No results for input(s): "WBC", "HGB", "HCT", "PLT" in the last 72 hours.  No results for input(s): "NA", "K", "CL", "CO2", "GLUCOSE", "BUN", "CREATININE", "CALCIUM" in the last 72 hours.   Intake/Output Summary (Last 24 hours) at 02/07/2023 2053 Last data filed at 02/06/2023 2129 Gross per 24 hour  Intake 120 ml  Output --  Net 120 ml        Physical Exam: Vital Signs Blood pressure (!) 143/61, pulse 69, temperature 98.7 F (37.1 C), temperature source Oral, resp. rate 18, height 5\' 2"  (1.575 m), weight 83.6 kg, SpO2 98%.   General: No acute distress. Laying in bed.  Mood and affect are appropriate Heart: Regular rate and rhythm no rubs murmurs or extra sounds Lungs: Clear to auscultation, breathing unlabored, no rales or wheezes Abdomen: Positive bowel sounds, soft nontender to palpation, nondistended Extremities: No clubbing, cyanosis, or edema Skin:  + Right hip incision with Steri-Strips - well approximated, mild serous drainage   Neurologic: Cranial nerves II through XII intact, Strength 5 out of 5 throughout bilateral upper and left lower extremity, 3-5 left hip flexion, 4 out of 5 knee extension limited by pain - unchanged Sensory exam normal sensation to light touch  in bilateral upper and lower extremities Cerebellar exam normal finger to nose to finger Musculoskeletal: Full range of motion in all 4 extremities. No joint swelling   Assessment/Plan: 1. Functional deficits which  require 3+ hours per day of interdisciplinary therapy in a comprehensive inpatient rehab setting. Physiatrist is providing close team supervision and 24 hour management of active medical problems listed below. Physiatrist and rehab team continue to assess barriers to discharge/monitor patient progress toward functional and medical goals  Care Tool:  Bathing    Body parts bathed by patient: Right arm, Left arm, Chest, Abdomen, Front perineal area, Right upper leg, Left upper leg, Face   Body parts bathed by helper: Buttocks, Right lower leg, Left lower leg     Bathing assist Assist Level: Moderate Assistance - Patient 50 - 74%     Upper Body Dressing/Undressing Upper body dressing   What is the patient wearing?: Pull over shirt    Upper body assist Assist Level: Set up assist    Lower Body Dressing/Undressing Lower body dressing      What is the patient wearing?: Underwear/pull up, Pants     Lower body assist Assist for lower body dressing: Moderate Assistance - Patient 50 - 74%     Toileting Toileting Toileting Activity did not occur (Clothing management and hygiene only): N/A (no void or bm)  Toileting assist Assist for toileting: Minimal Assistance - Patient > 75%     Transfers Chair/bed transfer  Transfers assist     Chair/bed transfer assist level: Contact Guard/Touching  assist     Locomotion Ambulation   Ambulation assist      Assist level: Supervision/Verbal cueing Assistive device: Rollator Max distance: 100   Walk 10 feet activity   Assist     Assist level: Supervision/Verbal cueing Assistive device: Rollator   Walk 50 feet activity   Assist Walk 50 feet with 2 turns activity did not occur: Safety/medical concerns  Assist level: Supervision/Verbal cueing Assistive device: Rollator    Walk 150 feet activity   Assist Walk 150 feet activity did not occur: Safety/medical concerns         Walk 10 feet on uneven surface   activity   Assist Walk 10 feet on uneven surfaces activity did not occur: Safety/medical concerns         Wheelchair     Assist Is the patient using a wheelchair?: Yes Type of Wheelchair: Manual    Wheelchair assist level: Dependent - Patient 0%      Wheelchair 50 feet with 2 turns activity    Assist        Assist Level: Dependent - Patient 0%   Wheelchair 150 feet activity     Assist      Assist Level: Dependent - Patient 0%   Blood pressure (!) 143/61, pulse 69, temperature 98.7 F (37.1 C), temperature source Oral, resp. rate 18, height 5\' 2"  (1.575 m), weight 83.6 kg, SpO2 98%. Medical Problem List and Plan: 1. 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  Antithrombotics: -DVT/anticoagulation: Eliquis             -antiplatelet therapy: Aspirin 81 mg daily 3. Pain Management: Oxycodone/tramadol as needed- mainly taking tramadol prn- for R THP done 01/18/23 4. Mood/Behavior/Sleep: Celexa 20 mg daily, Klonopin 0.5 mg 3 times daily as needed             -antipsychotic agents: N/A 5. Neuropsych/cognition: This patient is capable of making decisions on her own behalf. 6. Skin/Wound Care: Routine skin checks remove 1/2 R hip staples today and 1/2 in am , steristrip in between    - 7-20: Ordered to nursing to remove remainder staples, request Steri-Strips approximately every 1/8 inch to support areas of mild drainage for wound edges have not yet come together. - done  7. Fluids/Electrolytes/Nutrition: Routine in and outs with follow-up chemistries 8.  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 Francesco Sor.  Weightbearing as tolerated- honeycomb still in place- per pt,  wanted to leave staples "a few more days". Posterior hip precautions   9.  Acute blood loss anemia after THA.  Follow-up CBC 10.  CAD/PCI and stenting.  Continue aspirin therapy 11.  Atrial fibrillation.  Maintained on Eliquis as well as Lopressor 25 mg twice daily.  Cardiac rate controlled.  Follow-up Dr.Shaukat Delbert [place].  Diastolic congestive heart failure.  Demadex 20 mg daily, Entresto 24-26 mg twice daily.  Monitor for any signs of fluid overload Filed Weights   02/02/23 1624  Weight: 83.6 kg   -No signs of fluid overload, mild diastolic hypotension today, monitor.  13.  Hyperlipidemia.  Lipitor 14.  History  of gout.  Zyloprim 300 mg daily.  Monitor for any gout flareups 15.  Type 2 diabetes mellitus.  Hemoglobin A1c 6.1.  Patient on no medication prior to admission.  CBGs discontinued. 16.  Bilateral carotid stenosis.  Follow-up outpatient vascular surgery Dr. Wyn Quaker     LOS: 5 days A FACE TO FACE EVALUATION WAS PERFORMED  Angelina Sheriff 02/07/2023, 8:53 PM

## 2023-02-07 NOTE — Plan of Care (Signed)
  Problem: RH SAFETY Goal: RH STG ADHERE TO SAFETY PRECAUTIONS W/ASSISTANCE/DEVICE Description: STG Adhere to Safety Precautions With cues Assistance/Device. Outcome: Progressing   Problem: RH PAIN MANAGEMENT Goal: RH STG PAIN MANAGED AT OR BELOW PT'S PAIN GOAL Description: < 4 with prns Outcome: Progressing

## 2023-02-08 DIAGNOSIS — I5032 Chronic diastolic (congestive) heart failure: Secondary | ICD-10-CM

## 2023-02-08 DIAGNOSIS — I63412 Cerebral infarction due to embolism of left middle cerebral artery: Secondary | ICD-10-CM

## 2023-02-08 DIAGNOSIS — D62 Acute posthemorrhagic anemia: Secondary | ICD-10-CM

## 2023-02-08 LAB — GLUCOSE, CAPILLARY: Glucose-Capillary: 150 mg/dL — ABNORMAL HIGH (ref 70–99)

## 2023-02-08 NOTE — Progress Notes (Signed)
Physical Therapy Session Note  Patient Details  Name: Brooke Davis MRN: [id number] Date of Birth: 1940-08-12  Today's Date: 02/08/2023 PT Individual Time: 2440-1027 PT Individual Time Calculation (min): 73 min   Short Term Goals: Week 1:  PT Short Term Goal 1 (Week 1): STG = LTG d/t ELOS   Skilled Therapeutic Interventions/Progress Updates:  Patient supine in bed on entrance to room. Patient alert and agreeable to PT session.   Patient with no pain complaint at start of session.  Messaging with therapy team earlier in day re: pt's CLOF, desire to go home, and potential for d/c soon.   Therapeutic Activity: Bed Mobility: Pt performed supine <> sit with Mod I. Requires extra time for focus to RLE and slow movement to prevent spike in pain level. No cueing required. On return to supine, provided CGA for RLE but pt is able to bring LE to bed surface with use of hands.  Transfers: Pt performed sit<>stand and stand pivot transfers throughout session with supervision/ Mod I to RW and supervision to rollator with cueing for safe brake application. Pt relates hospital rollator moves more easily than hers at home so she doesn't use the brakes on rollator at home. Ambulatory toilet transfer performed with supervision and pt is able to manage clothing at toilet without physical assist and distant supervision/ Mod I. Pericare with IND. Pt is possible candidate for mod I in room and will address this with therapy team for tomorrow.   Pt relates that dtr with sedan will be picking her up from hospital to d/c home. Simulated car transfer performed with 6" partition from mat table and pt performs with good technique to sit, then pivot on seat to bring LLE then use BUE to assist RLE into "footwell" of car. Pt feels that she will be able to perform with supervision at actual car. Returns out of car with supervision and therapist providing RW to exit.   Gait Training:  Pt ambulated 130' x1/ 68' x1 using  rollator with CGA d/t pt's explanation of "squirrelly" movements. Requires seated rest break with rollator pushed into wall and vc to lock brakes. During ambulation, demonstrated slow pace with improved time on RLE and partial step-through with LLE. Provided vc/ tc for increasing time on RLE. Pt complains that L knee has pain d/t chronic OA and need for knee replacement surgery. On return trip to room, pt uses RW with improved depressed shoulder positioning to put pressure into BUE. Reaches 68' x1 using RW with close supervision.   Pt may not be able to complete step and reach LTG for one curb step in community. Will attempt tomorrow.   Neuromuscular Re-ed: NMR facilitated during session with focus on standing balance. Pt guided in toe touches to 2" step with RLE. Demos difficulty and quick lift of foot initially, then progresses to more purposeful movement pattern to place foot on step. Completes 2x10 reps. No 3 or 4" step available in order to progress.   Sit<>stand blocked practice of rise to stand and slow descent using no AD. Good steady balance on stance throughout.   Following blocked practice, pt guided in timed 5xSTS test and completes 5 in 22.96 sec to no AD.   NMR performed for improvements in motor control and coordination, balance, sequencing, judgement, and self confidence/ efficacy in performing all aspects of mobility at highest level of independence.   Patient supine in bed at end of session with brakes locked, bed alarm set, and all needs  within reach.   Therapy Documentation Precautions:  Precautions Precautions: Posterior Hip Precaution Booklet Issued: No Precaution Comments: mild R hemipareisis vs pain limiting Restrictions Weight Bearing Restrictions: Yes RLE Weight Bearing: Weight bearing as tolerated General:   Vital Signs:   Pain:  Pain indicated at end of session but improves with repositioning to supine at rest.   Therapy/Group: Individual Therapy  Loel Dubonnet PT, DPT, CSRS 02/08/2023, 5:26 PM

## 2023-02-08 NOTE — Progress Notes (Signed)
Occupational Therapy Session Note  Patient Details  Name: Brooke Davis MRN: [id number] Date of Birth: [birth year]  Today's Date: 02/08/2023 OT Individual Time: 3557-3220 OT Individual Time Calculation (min): 71 min    Short Term Goals: Week 1:  OT Short Term Goal 1 (Week 1): STGs=LTGs due to patient's length of stay.  Skilled Therapeutic Interventions/Progress Updates:  Pt greeted seated EOB, pt agreeable to OT intervention. Pt donned [place] shirt with set- up assist. Pt completed functional ambulation to sink with rollator and supervision, pt stood at sink for oral care with distant supervision.   Transported pt to tub room for time mgmt. Pt reports she would sit on the ledge of her tub and then transfer to shower seat prior to CVA. Recommended TTB at this time for increased safety and to decrease risk of breaking hip precautions when transferring from ledge of tub to shower seat, pt agreeable and reports TTB at home. Pt completed ambulatory tub transfer to TTB with rollator and supervision, pt does have to manually elevate RLE over tub threshold.   Remainder of session focused BUE HEP for home with level 1 theraband with pt able to return demo below therex:   X10 shoulder flexion  X10 bicep curls X10 shoulder horizontal ABD X10 shoulder diagonal pulls X10 shoulder extension  X10 alternating punches X10 bilateral shoulder external rotation   Issued pt written HEP to increase carryover   Pt completed functional ambulation back to room with rollator and supervision with chair follow with pt needing one seated rest break at half way point. Pt reports feeling like low energy is her biggest deficit d/t her afib.  Education provided on various topics for home as indicated below: -always having someone present for showering -locking rollator and backing rollator against wall when sitting on it - use AE at home to maintain hip precautions I.e keeping reacher in bathroom for clothing  mgmt -leaning on family for IADLS at least initially I.e cooking -oriented pt to health resource notebook     Ended session with pt supine in bed with all needs within reach and bed alarm activated.                   Therapy Documentation Precautions:  Precautions Precautions: Posterior Hip Precaution Booklet Issued: No Precaution Comments: mild R hemipareisis vs pain limiting Restrictions Weight Bearing Restrictions: Yes RLE Weight Bearing: Weight bearing as tolerated  Pain: unrated pain reported in back and R hip, rest breaks provided as needed.   Therapy/Group: Individual Therapy  Pollyann Glen [facility] 02/08/2023, 8:52 AM

## 2023-02-08 NOTE — Progress Notes (Signed)
PROGRESS NOTE   Subjective/Complaints:  Pt doesn't like diet. Has been drinking ensures but thinks they might be giving her diarrhea. Anxious to get home  ROS: Patient denies fever, rash, sore throat, blurred vision, dizziness, nausea, vomiting,   cough, shortness of breath or chest pain, joint or back/neck pain, headache, or mood change.    Objective:   No results found. No results for input(s): "WBC", "HGB", "HCT", "PLT" in the last 72 hours.  No results for input(s): "NA", "K", "CL", "CO2", "GLUCOSE", "BUN", "CREATININE", "CALCIUM" in the last 72 hours.   Intake/Output Summary (Last 24 hours) at 02/08/2023 1307 Last data filed at 02/08/2023 1242 Gross per 24 hour  Intake 440 ml  Output --  Net 440 ml        Physical Exam: Vital Signs Blood pressure 137/74, pulse 77, temperature 97.7 F (36.5 C), temperature source Oral, resp. rate 18, height 5\' 2"  (1.575 m), weight 83.6 kg, SpO2 100%.   Constitutional: No distress . Vital signs reviewed. obese HEENT: NCAT, EOMI, oral membranes moist Neck: supple Cardiovascular: RRR without murmur. No JVD    Respiratory/Chest: CTA Bilaterally without wheezes or rales. Normal effort    GI/Abdomen: BS +, non-tender, non-distended Ext: no clubbing, cyanosis, or edema Psych: pleasant and cooperative Skin:  + Right hip incision with Steri-Strips - well approximated, minimal serous drainage   Neurologic: Cranial nerves II through XII intact, Strength 5 out of 5 throughout bilateral upper and left lower extremity, 3-5 left hip flexion, 4 out of 5 knee extension limited by pain - unchanged Sensory exam normal sensation to light touch  in bilateral upper and lower extremities Cerebellar exam normal finger to nose to finger Musculoskeletal: Full range of motion in all 4 extremities. No joint swelling   Assessment/Plan: 1. Functional deficits which require 3+ hours per day of  interdisciplinary therapy in a comprehensive inpatient rehab setting. Physiatrist is providing close team supervision and 24 hour management of active medical problems listed below. Physiatrist and rehab team continue to assess barriers to discharge/monitor patient progress toward functional and medical goals  Care Tool:  Bathing    Body parts bathed by patient: Right arm, Left arm, Chest, Abdomen, Front perineal area, Right upper leg, Left upper leg, Face   Body parts bathed by helper: Buttocks, Right lower leg, Left lower leg     Bathing assist Assist Level: Moderate Assistance - Patient 50 - 74%     Upper Body Dressing/Undressing Upper body dressing   What is the patient wearing?: Pull over shirt    Upper body assist Assist Level: Set up assist    Lower Body Dressing/Undressing Lower body dressing      What is the patient wearing?: Underwear/pull up, Pants     Lower body assist Assist for lower body dressing: Moderate Assistance - Patient 50 - 74%     Toileting Toileting Toileting Activity did not occur (Clothing management and hygiene only): N/A (no void or bm)  Toileting assist Assist for toileting: Minimal Assistance - Patient > 75%     Transfers Chair/bed transfer  Transfers assist     Chair/bed transfer assist level: Contact Guard/Touching assist  Locomotion Ambulation   Ambulation assist      Assist level: Supervision/Verbal cueing Assistive device: Rollator Max distance: 100   Walk 10 feet activity   Assist     Assist level: Supervision/Verbal cueing Assistive device: Rollator   Walk 50 feet activity   Assist Walk 50 feet with 2 turns activity did not occur: Safety/medical concerns  Assist level: Supervision/Verbal cueing Assistive device: Rollator    Walk 150 feet activity   Assist Walk 150 feet activity did not occur: Safety/medical concerns         Walk 10 feet on uneven surface  activity   Assist Walk 10 feet  on uneven surfaces activity did not occur: Safety/medical concerns         Wheelchair     Assist Is the patient using a wheelchair?: Yes Type of Wheelchair: Manual    Wheelchair assist level: Dependent - Patient 0%      Wheelchair 50 feet with 2 turns activity    Assist        Assist Level: Dependent - Patient 0%   Wheelchair 150 feet activity     Assist      Assist Level: Dependent - Patient 0%   Blood pressure 137/74, pulse 77, temperature 97.7 F (36.5 C), temperature source Oral, resp. rate 18, height 5\' 2"  (1.575 m), weight 83.6 kg, SpO2 100%. Medical Problem List and Plan: 1. 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  Antithrombotics: -DVT/anticoagulation: Eliquis             -antiplatelet therapy: Aspirin 81 mg daily 3. Pain Management: Oxycodone/tramadol as needed- mainly taking tramadol prn- for R THP done 01/18/23 4. Mood/Behavior/Sleep: Celexa 20 mg daily, Klonopin 0.5 mg 3 times daily as needed             -antipsychotic agents: N/A 5. Neuropsych/cognition: This patient is capable of making decisions on her own behalf. 6. Skin/Wound Care: Routine skin checks remove 1/2 R hip staples today and 1/2 in am , steristrip in between    - staples out--wound healing 7. Fluids/Electrolytes/Nutrition: encourage adequate po  -might have mild renal insuffiency  -encourage adequate fluids and po intake in general  -see volume discussion below 8.  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 Francesco Sor.  Weightbearing as tolerated- honeycomb still in place- per pt, wanted to leave staples "a few more days". Posterior hip precautions   9.  Acute blood loss anemia after THA.  Hgb trending down. No gross blood loss    -continue to monitor--recheck later this week 10.  CAD/PCI and stenting.  Continue aspirin therapy 11.  Atrial fibrillation.  Maintained on Eliquis as well as Lopressor 25 mg twice daily.  Cardiac rate controlled.  Follow-up Dr.Shaukat Delbert [place].  Diastolic congestive heart failure.  Demadex 20 mg daily, Entresto 24-26 mg twice daily.  Monitor for any signs of fluid overload Filed Weights   02/02/23 1624  Weight: 83.6 kg   -needs weights checked  13.  Hyperlipidemia.  Lipitor 14.  History of gout.  Zyloprim 300 mg daily.  Monitor for any gout flareups 15.  Type 2 diabetes mellitus.  Hemoglobin A1c 6.1.  Patient on no medication prior to admission.  CBGs discontinued. 16.  Bilateral carotid stenosis.  Follow-up outpatient vascular surgery Dr. Wyn Quaker     LOS: 6 days A FACE TO FACE EVALUATION WAS PERFORMED  Brooke Davis 02/08/2023, 1:07 PM

## 2023-02-08 NOTE — Progress Notes (Signed)
Speech Language Pathology Daily Session Note  Patient Details  Name: TEVA BRONKEMA MRN: [id number] Date of Birth: 23-Jul-1940  Today's Date: 02/08/2023 SLP Individual Time: 1000-1100 SLP Individual Time Calculation (min): 60 min  Short Term Goals: Week 1: SLP Short Term Goal 1 (Week 1): STGs = LTGs 2* ELOS  Skilled Therapeutic Interventions: Skilled therapy intervention focused on word finding and auditory comprehension goals. SLP addressed word finding through categorical naming tasks. Patient able to name items in a category with alphabetical restrictions (name items in a category starting with __ letter) independently with girl/boy names and supervision A for animals/foods. Patient able to independently recall word finding strategies. SLP reviewed memory strategies and encouraged patient to utilize them during auditory comprehension tasks. Patient able to answer auditory comprehension questions with mod I assist with non-fiction texts and min A with fiction texts. Patient reports wanting to d/c this week. Patient left in bed with alarm set and call bell in reach. Continue POC.   Pain Pain Assessment Pain Scale: 0-10 Pain Score: 0-No pain  Therapy/Group: Individual Therapy  Kiya Eno M.A., CF-SLP 02/08/2023, 12:13 PM

## 2023-02-09 LAB — BASIC METABOLIC PANEL
Anion gap: 7 (ref 5–15)
BUN: 19 mg/dL (ref 8–23)
CO2: 27 mmol/L (ref 22–32)
Calcium: 8.9 mg/dL (ref 8.9–10.3)
Chloride: 103 mmol/L (ref 98–111)
Creatinine, Ser: 1.14 mg/dL — ABNORMAL HIGH (ref 0.44–1.00)
GFR, Estimated: 49 mL/min — ABNORMAL LOW (ref >60)
Glucose, Bld: 138 mg/dL — ABNORMAL HIGH (ref 70–99)
Potassium: 3.6 mmol/L (ref 3.5–5.1)
Sodium: 137 mmol/L (ref 135–145)

## 2023-02-09 NOTE — Progress Notes (Signed)
Patient ID: Brooke Davis, female   DOB: 1940/08/04, 82 y.o.   MRN: [id number]  No DME needs for d/c. Patient prefers HH at d/c. HH established with [facility].

## 2023-02-09 NOTE — Progress Notes (Signed)
Physical Therapy Discharge Summary  Patient Details  Name: Brooke Davis MRN: [id number] Date of Birth: 1941/02/21  Today's Date: 02/09/2023 PT Individual Time: 1445-1530 PT Individual Time Calculation (min): 45 min   Date of Discharge from PT service:February 09, 2023  Patient has met 9 of 9 long term goals due to improved activity tolerance, improved balance, improved postural control, and increased strength.  Patient to discharge at an ambulatory level Modified Independent.     Reasons goals not met: N/A  Recommendation:  Patient will benefit from ongoing skilled PT services in home health setting to continue to advance safe functional mobility, address ongoing impairments in LE weakness, reliance on assistive devices, and minimize fall risk.  Equipment: No equipment provided  Reasons for discharge: treatment goals met  Patient/family agrees with progress made and goals achieved: Yes  PT Discharge Precautions/Restrictions Precautions Precautions: Posterior Hip Restrictions Other Position/Activity Restrictions: RLE WBAT Vital Signs   Pain Pain Assessment Pain Scale: 0-10 Pain Score: 5  Pain Intervention(s): Medication (See eMAR);RN made aware Pain Interference Pain Interference Pain Effect on Sleep: 1. Rarely or not at all Pain Interference with Therapy Activities: 1. Rarely or not at all Pain Interference with Day-to-Day Activities: 1. Rarely or not at all Vision/Perception  Vision - History Ability to See in Adequate Light: 0 Adequate Perception Perception: Within Functional Limits Praxis Praxis: Intact  Cognition Overall Cognitive Status: Within Functional Limits for tasks assessed Arousal/Alertness: Awake/alert Orientation Level: Oriented X4 Year: 2024 Month: July Day of Week: Correct Attention: Focused;Sustained Focused Attention: Appears intact Sustained Attention: Appears intact Selective Attention Impairment: Verbal basic;Verbal complex Memory:  Appears intact Awareness: Appears intact Problem Solving: Appears intact Safety/Judgment: Appears intact Sensation Sensation Light Touch: Appears Intact Hot/Cold: Appears Intact Proprioception: Appears Intact Stereognosis: Appears Intact Coordination Gross Motor Movements are Fluid and Coordinated: No Fine Motor Movements are Fluid and Coordinated: Yes Coordination and Movement Description: mainly limited by post surgical pain and weakness in RLE Motor  Motor Motor: Other (comment) Motor - Skilled Clinical Observations: Mainly limited by weakness in RLE Motor - Discharge Observations: Pt with decreased WB tolerance in RLE  Mobility Bed Mobility Bed Mobility: Rolling Left;Left Sidelying to Sit;Supine to Sit Rolling Left: Independent with assistive device Left Sidelying to Sit: Independent Supine to Sit: Independent with assistive device Sit to Supine: Independent with assistive device Transfers Transfers: Stand Pivot Transfers;Stand to Sit;Sit to Stand Sit to Stand: Independent with assistive device Stand to Sit: Independent with assistive device Stand Pivot Transfers: Independent with assistive device Transfer (Assistive device): Rolling walker Locomotion  Gait Ambulation: Yes Gait Assistance: Independent with assistive device Gait Distance (Feet): 150 Feet Assistive device: Rolling walker Gait Gait Pattern: Step-through pattern;Trunk flexed Gait velocity: decreased Stairs / Additional Locomotion Stairs: Yes Stairs Assistance: Independent with assistive device Stair Management Technique: With walker Number of Stairs: 1 Ramp: Independent with assistive device Curb: Independent with assistive device Pick up small object from the floor (from standing position) activity did not occur: Safety/medical concerns (2/2 precautions with bending) Pick up small object from the floor assist level: Dependent - Patient 0% Pick up small object from the floor assistive device: uses  reacher 2/2 precautions Wheelchair Mobility Wheelchair Mobility: Yes Wheelchair Assistance: Independent with assistive device Wheelchair Propulsion: Both upper extremities Wheelchair Parts Management: Independent Distance: 150  Trunk/Postural Assessment  Cervical Assessment Cervical Assessment: Within Functional Limits Thoracic Assessment Thoracic Assessment: Exceptions to [facility] (Rounded Shoulders) Lumbar Assessment Lumbar Assessment: Within Functional Limits Postural Control Postural Control: Within Functional Limits  Balance Static Standing Balance Static Standing - Level of Assistance: 6: Modified independent (Device/Increase time) Dynamic Standing Balance Dynamic Standing - Level of Assistance: 6: Modified independent (Device/Increase time) Extremity Assessment  RUE Assessment General Strength Comments: 4- (good strength improvements) LUE Assessment General Strength Comments: 4/+/5 (L hand dominant)   Session Note: Chart reviewed and pt agreeable to therapy. Pt received seated in bed with no c/o pain. Session focused on discharge. Pt initiated session with review of function as indicated above. Pt also practice amb of 185ft, WC of 131ft and 1 curb step all with ModI using RW when appropriate. Session education emphasized continued care after d/c. At end of session, pt was left seated in bed at modI level in room with nurse call bell and all needs in reach.       Meda Coffee, PT, DPT 02/09/2023, 3:43 PM

## 2023-02-09 NOTE — Progress Notes (Signed)
Speech Language Pathology Discharge Summary  Patient Details  Name: Brooke Davis MRN: [id number] Date of Birth: 02-14-1941  Date of Discharge from SLP service:February 09, 2023  Today's Date: 02/09/2023 SLP Individual Time: 0801-0900 SLP Individual Time Calculation (min): 59 min   Skilled Therapeutic Interventions:  SLP conducted skilled therapy session targeting receptive and expressive language and cognitive goals. SLP guided patient through word finding activities including description based vocabulary tasks, divergent naming, utilization of circumlocution/description strategies, and following complex commands. Patient continues to exhibit mild word finding deficits but independently utilizes recommended description strategies. SLP provided patient with in depth list of options for describing words including stating qualities of the target word/object, location, actions, and associations. Patient verbalized understanding to provided education. SLP reassessed patient's auditory comprehension via complex command following. Patient achieved 100% accuracy independently. Suspect that alongside patient's overall improvement, initial auditory comprehension abilities shown on testing may have been impacted by hearing loss. Throughout session, patient attended to therapist on right side independently. Patient was left in lowered bed with call bell in reach and bed alarm set.   Patient has met 3 of 3 long term goals.  Patient to discharge at overall Modified Independent level.  Reasons goals not met:   n/a  Clinical Impression/Discharge Summary:   Patient has made excellent gains across all three goal areas and has demonstrated readiness for discharge from inpatient speech therapy services. Patient continues to exhibit mild word finding deficits but independently demonstrates ability to recall and utilize word finding strategies including circumlocution/description techniques to assist communication  partners with understanding her message. Receptively, patient appears to be back to baseline and follows complex commands with 100% accuracy independently. During final daily session, patient attended to right side independently for 1 hour. Patient has met all three targeted goals and is appropriate for discharge home. Recommending home health SLP services for evaluation, however suspect patient will not require prolonged SLP services after discharge to return to baseline level of functioning.   Care Partner: Grandson      Recommendation:  Home Health SLP  Rationale for SLP Follow Up: Maximize functional communication   Equipment:   n/a  Reasons for discharge: Treatment goals met   Patient/Family Agrees with Progress Made and Goals Achieved: Yes   Jeannie Done, M.A., CCC-SLP  Yetta Barre 02/09/2023, 11:11 AM

## 2023-02-09 NOTE — Plan of Care (Signed)
  Problem: RH Balance Goal: LTG Patient will maintain dynamic standing with ADLs (OT) Description: LTG:  Patient will maintain dynamic standing balance with assist during activities of daily living (OT)  Outcome: Completed/Met   Problem: RH Bathing Goal: LTG Patient will bathe all body parts with assist levels (OT) Description: LTG: Patient will bathe all body parts with assist levels (OT) Outcome: Completed/Met   Problem: RH Dressing Goal: LTG Patient will perform lower body dressing w/assist (OT) Description: LTG: Patient will perform lower body dressing with assist, with/without cues in positioning using equipment (OT) Outcome: Completed/Met   Problem: RH Toileting Goal: LTG Patient will perform toileting task (3/3 steps) with assistance level (OT) Description: LTG: Patient will perform toileting task (3/3 steps) with assistance level (OT)  Outcome: Completed/Met   Problem: RH Functional Use of Upper Extremity Goal: LTG Patient will use RT/LT upper extremity as a (OT) Description: LTG: Patient will use right/left upper extremity as a stabilizer/gross assist/diminished/nondominant/dominant level with assist, with/without cues during functional activity (OT) Outcome: Completed/Met   Problem: RH Toilet Transfers Goal: LTG Patient will perform toilet transfers w/assist (OT) Description: LTG: Patient will perform toilet transfers with assist, with/without cues using equipment (OT) Outcome: Completed/Met   Problem: RH Tub/Shower Transfers Goal: LTG Patient will perform tub/shower transfers w/assist (OT) Description: LTG: Patient will perform tub/shower transfers with assist, with/without cues using equipment (OT) Outcome: Completed/Met

## 2023-02-09 NOTE — Plan of Care (Signed)
Patient progressing 

## 2023-02-09 NOTE — Progress Notes (Signed)
Occupational Therapy Discharge Summary  Patient Details  Name: Brooke Davis MRN: [id number] Date of Birth: [birth year]  Date of Discharge from OT service:February 09, 2023  Today's Date: 02/09/2023 OT Individual Time: 1045-1200 OT Individual Time Calculation (min): 75 min   Pt received in room sitting in wc ready for therapy. Pt agreeable to a shower and completing all self care as independently as possible.  Pt stated at home she will use her 4WW but feels ours is not stable enough so in the hospital room she prefers to use the regular RW.   Pt ambulated in room with RW, gathered all supplies, showered and undressed and dressed using reaching to follow hip precautions all with mod I.   No LOB or unsafe movement patterns.  Moved w/c out of the way as pt will not be using one at home and had her sit in recliner instead.    Pt engaged in UE AROM exercises and provided her with theraputty.  (Med soft) pt practiced using her putty working on pinch and grip strength.   Discussed the support she has at home.  She has good family support and they assist with IADLs.    Pt resting in recliner with all needs met. call light in reach.  Pt is now mod Independent in the room.   Patient has met 8 of 8 long term goals due to improved activity tolerance, improved balance, ability to compensate for deficits, and improved coordination.  Patient to discharge at overall Modified Independent level.  Patient's care partner is independent to provide the necessary physical assistance at discharge.    Pt education only, no family education needed.   Reasons goals not met: n/a  Recommendation:  Patient will benefit from ongoing skilled OT services in home health setting to continue to advance functional skills in the area of iADL.  Equipment: No equipment provided  Reasons for discharge: treatment goals met  Patient/family agrees with progress made and goals achieved: Yes  OT Discharge Precautions/Restrictions   Precautions Precautions: Posterior Hip Restrictions Weight Bearing Restrictions: Yes RLE Weight Bearing: Weight bearing as tolerated Other Position/Activity Restrictions: RLE WBAT   Pain Pain Assessment Pain Scale: 0-10 Pain Score: 5  Pain Type: Chronic pain Pain Location: Back (Low back) Pain Orientation: Mid;Lower Pain Descriptors / Indicators: Tightness;Aching Pain Onset: On-going Pain Intervention(s): Medication (See eMAR);RN made aware ADL ADL Eating: Independent Where Assessed-Eating: Chair Grooming: Independent Where Assessed-Grooming: Standing at sink Upper Body Bathing: Independent Where Assessed-Upper Body Bathing: Shower Lower Body Bathing: Modified independent Where Assessed-Lower Body Bathing: Shower Upper Body Dressing: Independent Where Assessed-Upper Body Dressing: Chair Lower Body Dressing: Modified independent Where Assessed-Lower Body Dressing: Chair Toileting: Modified independent Where Assessed-Toileting: Teacher, adult education: Engineer, agricultural Method: Proofreader: Acupuncturist: Cytogeneticist Method: Designer, industrial/product: Sales promotion account executive Baseline Vision/History: 1 Wears glasses Patient Visual Report: No change from baseline Vision Assessment?: No apparent visual deficits Eye Alignment: Within Functional Limits Ocular Range of Motion: Within Functional Limits Alignment/Gaze Preference: Within Defined Limits Tracking/Visual Pursuits: Able to track stimulus in all quads without difficulty Visual Fields: No apparent deficits Perception  Perception: Within Functional Limits Praxis Praxis: Intact Cognition Cognition Overall Cognitive Status: Within Functional Limits for tasks assessed Arousal/Alertness: Awake/alert Orientation Level: Person;Place;Situation Person: Oriented Place: Oriented Situation: Oriented Memory:  Appears intact Attention: Focused;Sustained Focused Attention: Appears intact Sustained Attention: Appears intact Selective Attention Impairment: Verbal basic;Verbal complex Awareness: Appears intact Problem  Solving: Appears intact Safety/Judgment: Appears intact Brief Interview for Mental Status (BIMS) Repetition of Three Words (First Attempt): 3 Temporal Orientation: Year: Correct Temporal Orientation: Month: Accurate within 5 days Temporal Orientation: Day: Correct Recall: "Sock": No, could not recall Recall: "Blue": Yes, no cue required Recall: "Bed": Yes, no cue required BIMS Summary Score: 13 Sensation Sensation Light Touch: Appears Intact Hot/Cold: Appears Intact Proprioception: Appears Intact Stereognosis: Appears Intact Coordination Gross Motor Movements are Fluid and Coordinated: No Fine Motor Movements are Fluid and Coordinated: Yes Coordination and Movement Description: mainly limited by post surgical pain and weakness in RLE Motor   Generalized motor weakness Mobility    Mod I with RW for BADLs Trunk/Postural Assessment    WFL Balance Balance Balance Assessed: Yes Standardized Balance Assessment Standardized Balance Assessment: Timed Up and Go Test Timed Up and Go Test TUG: Normal TUG Normal TUG (seconds): 35 Static Sitting Balance Static Sitting - Balance Support: Feet supported Static Sitting - Level of Assistance: 7: Independent Dynamic Sitting Balance Dynamic Sitting - Balance Support: Feet supported;During functional activity Dynamic Sitting - Level of Assistance: 7: Independent Static Standing Balance Static Standing - Balance Support: During functional activity;Bilateral upper extremity supported Static Standing - Level of Assistance: 6: Modified independent (Device/Increase time) Dynamic Standing Balance Dynamic Standing - Balance Support: During functional activity;Bilateral upper extremity supported Dynamic Standing - Level of Assistance: 6:  Modified independent (Device/Increase time) Extremity/Trunk Assessment RUE Assessment General Strength Comments: 4- (good strength improvements) LUE Assessment General Strength Comments: 4/+/5 (L hand dominant)   Jaylie Neaves 02/09/2023, 12:38 PM

## 2023-02-09 NOTE — Progress Notes (Signed)
PROGRESS NOTE   Subjective/Complaints:  Pt in room with SLP. Feels pretty well. Loose stool better. Has generalized back pain from bein in bed. Anxious to get home for birthday!  ROS: Patient denies fever, rash, sore throat, blurred vision, dizziness, nausea, vomiting, diarrhea, cough, shortness of breath or chest pain,  headache, or mood change.     Objective:   No results found. No results for input(s): "WBC", "HGB", "HCT", "PLT" in the last 72 hours.  Recent Labs    02/09/23 0725  NA 137  K 3.6  CL 103  CO2 27  GLUCOSE 138*  BUN 19  CREATININE 1.14*  CALCIUM 8.9     Intake/Output Summary (Last 24 hours) at 02/09/2023 0919 Last data filed at 02/08/2023 1820 Gross per 24 hour  Intake 437 ml  Output --  Net 437 ml        Physical Exam: Vital Signs Blood pressure (!) 130/58, pulse 60, temperature 98 F (36.7 C), resp. rate 16, height 5\' 2"  (1.575 m), weight 85.2 kg, SpO2 100%.   Constitutional: No distress . Vital signs reviewed. obese HEENT: NCAT, EOMI, oral membranes moist Neck: supple Cardiovascular: RRR without murmur. No JVD    Respiratory/Chest: CTA Bilaterally without wheezes or rales. Normal effort    GI/Abdomen: BS +, non-tender, non-distended Ext: no clubbing, cyanosis, or edema Psych: pleasant and cooperative  Skin: + Right hip incision with Steri-Strips - well approximated, clean    Neurologic: Cranial nerves II through XII intact, Strength 5 out of 5 throughout bilateral upper and left lower extremity, 3-5 left hip flexion, 4 out of 5 knee extension limited by pain - unchanged Sensory exam normal sensation to light touch  in bilateral upper and lower extremities Cerebellar exam normal finger to nose to finger Musculoskeletal: Full range of motion in all 4 extremities. No joint swelling   Assessment/Plan: 1. Functional deficits which require 3+ hours per day of interdisciplinary therapy in  a comprehensive inpatient rehab setting. Physiatrist is providing close team supervision and 24 hour management of active medical problems listed below. Physiatrist and rehab team continue to assess barriers to discharge/monitor patient progress toward functional and medical goals  Care Tool:  Bathing    Body parts bathed by patient: Right arm, Left arm, Chest, Abdomen, Front perineal area, Right upper leg, Left upper leg, Face   Body parts bathed by helper: Buttocks, Right lower leg, Left lower leg     Bathing assist Assist Level: Moderate Assistance - Patient 50 - 74%     Upper Body Dressing/Undressing Upper body dressing   What is the patient wearing?: Pull over shirt    Upper body assist Assist Level: Set up assist    Lower Body Dressing/Undressing Lower body dressing      What is the patient wearing?: Underwear/pull up, Pants     Lower body assist Assist for lower body dressing: Moderate Assistance - Patient 50 - 74%     Toileting Toileting Toileting Activity did not occur (Clothing management and hygiene only): N/A (no void or bm)  Toileting assist Assist for toileting: Minimal Assistance - Patient > 75%     Transfers Chair/bed transfer  Transfers assist  Chair/bed transfer assist level: Contact Guard/Touching assist     Locomotion Ambulation   Ambulation assist      Assist level: Supervision/Verbal cueing Assistive device: Rollator Max distance: 100   Walk 10 feet activity   Assist     Assist level: Supervision/Verbal cueing Assistive device: Rollator   Walk 50 feet activity   Assist Walk 50 feet with 2 turns activity did not occur: Safety/medical concerns  Assist level: Supervision/Verbal cueing Assistive device: Rollator    Walk 150 feet activity   Assist Walk 150 feet activity did not occur: Safety/medical concerns         Walk 10 feet on uneven surface  activity   Assist Walk 10 feet on uneven surfaces activity  did not occur: Safety/medical concerns         Wheelchair     Assist Is the patient using a wheelchair?: Yes Type of Wheelchair: Manual    Wheelchair assist level: Dependent - Patient 0%      Wheelchair 50 feet with 2 turns activity    Assist        Assist Level: Dependent - Patient 0%   Wheelchair 150 feet activity     Assist      Assist Level: Dependent - Patient 0%   Blood pressure (!) 130/58, pulse 60, temperature 98 F (36.7 C), resp. rate 16, height 5\' 2"  (1.575 m), weight 85.2 kg, SpO2 100%. Medical Problem List and Plan: 1. 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 Goals supervision to min A -Continue CIR therapies including PT, OT, and SLP       2.  Antithrombotics: -DVT/anticoagulation: Eliquis             -antiplatelet therapy: Aspirin 81 mg daily 3. Pain Management: Oxycodone/tramadol as needed- mainly taking tramadol prn- for R THP done 01/18/23 4. Mood/Behavior/Sleep: Celexa 20 mg daily, Klonopin 0.5 mg 3 times daily as needed             -antipsychotic agents: N/A 5. Neuropsych/cognition: This patient is capable of making decisions on her own behalf. 6. Skin/Wound Care:      - staples out--wound healing 7. Fluids/Electrolytes/Nutrition: encourage adequate po  -might have mild renal insuffiency  -encourage adequate fluids and po intake in general  -see volume discussion below 8.  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 Francesco Sor.  Weightbearing as tolerated- honeycomb still in place- per pt, wanted to leave staples "a few more days". Posterior hip precautions   9.  Acute blood loss anemia after THA.  Hgb trending down. No gross blood loss   -continue to monitor--recheck later this week 10.  CAD/PCI and stenting.  Continue aspirin  therapy 11.  Atrial fibrillation.  Maintained on Eliquis as well as Lopressor 25 mg twice daily.  Cardiac rate controlled.  Follow-up Dr.Shaukat Delbert [place].  Diastolic congestive heart failure.  Demadex 20 mg daily, Entresto 24-26 mg twice daily.  Monitor for any signs of fluid overload Filed Weights   02/02/23 1624 02/09/23 0500  Weight: 83.6 kg 85.2 kg   -weight sl up from admit--follow for trend. Doesn't appear fluid overloaded  13.  Hyperlipidemia.  Lipitor 14.  History of gout.  Zyloprim 300 mg daily.  Monitor for any gout flareups 15.  Type 2 diabetes mellitus.  Hemoglobin A1c 6.1.  Patient on no medication prior to admission.  CBGs discontinued. 16.  Bilateral carotid stenosis.  Follow-up outpatient vascular surgery Dr. Wyn Quaker     LOS: 7 days A FACE TO FACE EVALUATION WAS PERFORMED  Ranelle Oyster 02/09/2023, 9:19 AM

## 2023-02-09 NOTE — Progress Notes (Signed)
Inpatient Rehabilitation Care Coordinator Discharge Note   Patient Details  Name: Brooke Davis MRN: [id number] Date of Birth: 06/09/1941   Discharge location: Home  Length of Stay: 8 Days  Discharge activity level: Mod I/Sup  Home/community participation: Daughter  Patient response GM:WNUUVO Literacy - How often do you need to have someone help you when you read instructions, pamphlets, or other written material from your doctor or pharmacy?: Sometimes  Patient response ZD:GUYQIH Isolation - How often do you feel lonely or isolated from those around you?: Never  Services provided included: MD, RD, OT, SLP, RN, PT, TR, CM, Pharmacy, SW  Financial Services:  Field seismologist Utilized: Media planner [facility] Medicare  Choices offered to/list presented to: patient  Follow-up services arranged:  Home Health Home Health Agency: [facility]         Patient response to transportation need: Is the patient able to respond to transportation needs?: Yes In the past 12 months, has lack of transportation kept you from medical appointments or from getting medications?: No In the past 12 months, has lack of transportation kept you from meetings, work, or from getting things needed for daily living?: No   Patient/Family verbalized understanding of follow-up arrangements:  Yes  Individual responsible for coordination of the follow-up plan: self or daughter  Confirmed correct DME delivered: Andria Rhein 02/09/2023    Comments (or additional information):  Summary of Stay    Date/Time Discharge Planning CSW  02/09/23 1457 Discharging home with daughter assist. [facility] established. CJB  02/02/23 1359 New admission, assesment pending CJB       Andria Rhein

## 2023-02-09 NOTE — Plan of Care (Signed)
  Problem: RH Comprehension Communication Goal: LTG Patient will comprehend basic/complex auditory (SLP) Description: LTG: Patient will comprehend basic/complex auditory information with cues (SLP). Outcome: Completed/Met   Problem: RH Expression Communication Goal: LTG Patient will verbally express basic/complex needs(SLP) Description: LTG:  Patient will verbally express basic/complex needs, wants or ideas with cues  (SLP) Outcome: Completed/Met   Problem: RH Pre-functional/Other (Specify) Goal: RH LTG SLP (Specify) 1 Description: RH LTG SLP (Specify) 1 Outcome: Completed/Met

## 2023-02-09 NOTE — Progress Notes (Signed)
Patient ID: Brooke Davis, female   DOB: Mar 01, 1941, 82 y.o.   MRN: [id number]  Sw informed pt ready for d/c on Wednesday 7/24.

## 2023-02-09 NOTE — Progress Notes (Signed)
Physical Therapy Session Note  Patient Details  Name: Brooke Davis MRN: [id number] Date of Birth: [birth year]  Today's Date: 02/09/2023 PT Individual Time: 0905-1002 PT Individual Time Calculation (min): 57 min   Short Term Goals: Week 1:  PT Short Term Goal 1 (Week 1): STG = LTG d/t ELOS  Skilled Therapeutic Interventions/Progress Updates: Patient supine in bed on entrance to room. Patient alert and agreeable to PT session.   Patient reported 5/10 pain at beginning of session in low back (middle) and stated RN provided pain medication prior to therapy. Pt reported decrease in pain to 3/10 after ambulating to day room gym.  Therapeutic Activity: Bed Mobility: Pt performed supine<>sit on EOB with modI (HOB elevated). No VC required.  Transfers: Pt performed sit<>stand transfers throughout session with modI. No VC required this session.   - TUG reassessed and pt increased from 52 seconds to 35 seconds and was modI throughout for safety.   Gait Training:  Pt ambulated from room to day room gym (100'+) with supervision for safety and use of with RW. VC provided to stand upright per forward flexed posture, and to decrease B UE WB (question cue of asking pt if pt felt like WB is increased in B UE's).  - 3" step to with HHA (2 x 8 stepping with R LE). Pt progressed to stepping to step with L LE (to increase WB on R LE and to decrease fear of WB on R LE) in RW (B UE support) 1 x 10 (CGA for safety). Pt progressed to x 10 with L UE on RW, and HHA on R (stepping with L LE). Pt attempted to step with L LE and with HHA only on L UE (increased UE WB on this side) and could only perform twice before noted buckling on R LE (pt reported no pain, only weakness).  Pt ambulated from day room to nursing station at end of session, but reported needing to sit in [facility] 2/2 reports of fatigue. Pt dependently transported to room.   Patient in [facility] at end of session with brakes locked, belt alarm set, and all needs  within reach.      Therapy Documentation Precautions:  Precautions Precautions: Posterior Hip Precaution Booklet Issued: No Precaution Comments: mild R hemipareisis vs pain limiting Restrictions Weight Bearing Restrictions: Yes RLE Weight Bearing: Weight bearing as tolerated Other Position/Activity Restrictions: RLE WBAT  Therapy/Group: Individual Therapy  Kahne Helfand PTA 02/09/2023, 1:21 PM

## 2023-02-09 NOTE — Progress Notes (Signed)
Inpatient Rehabilitation Discharge Medication Review by a Pharmacist  A complete drug regimen review was completed for this patient to identify any potential clinically significant medication issues.  High Risk Drug Classes Is patient taking? Indication by Medication  Antipsychotic {Receiving?:26196}   Anticoagulant {Receiving?:26196}   Antibiotic {Receiving?:26196}   Opioid {Receiving?:26196}   Antiplatelet {Receiving?:26196}   Hypoglycemics/insulin {Yes or No?:26198}   Vasoactive Medication {Receiving?:26196}   Chemotherapy {Receiving Chemo?:26197}   Other {Yes or No?:26198}      Type of Medication Issue Identified Description of Issue Recommendation(s)  Drug Interaction(s) (clinically significant)     Duplicate Therapy     Allergy     No Medication Administration End Date     Incorrect Dose     Additional Drug Therapy Needed     Significant med changes from prior encounter (inform family/care partners about these prior to discharge).  Communicate medication changes with patient/family at discharge  Other       Clinically significant medication issues were identified that warrant physician communication and completion of prescribed/recommended actions by midnight of the next day:  {Yes or No?:26198}  Name of provider notified for urgent issues identified: ***   Provider Method of Notification: ***    Pharmacist comments: ***   Time spent performing this drug regimen review (minutes): 20   Thank you for allowing pharmacy to be a part of this patient's care.  Thelma Barge, PharmD Clinical Pharmacist

## 2023-02-09 NOTE — Discharge Summary (Signed)
Physician Discharge Summary  Patient ID: Brooke Davis MRN: [id number] DOB/AGE: [birth year] 82 y.o.  Admit date: 02/02/2023 Discharge date: 02/10/2023  Discharge Diagnoses:  Principal Problem:   Embolic cerebral infarction [facility]) DVT prophylaxis Right hip arthroplasty 01/18/2023 Acute blood loss anemia CAD/PCI and stenting Atrial fibrillation Diastolic congestive heart failure Hyperlipidemia History of gout Type 2 diabetes mellitus Bilateral carotid stenosis  Discharged Condition: Stable  Significant Diagnostic Studies: DG Chest 2 View  Result Date: 02/02/2023 CLINICAL DATA:  Chest pain EXAM: CHEST - 2 VIEW COMPARISON:  02/18/2021 FINDINGS: The lungs are symmetrically well expanded. Small bilateral pleural effusions. Lungs are clear. No pneumothorax. Cardiac size is within normal limits. Pulmonary vascularity is normal. No acute bone abnormality. IMPRESSION: 1. Small bilateral pleural effusions. Electronically Signed   By: Helyn Numbers M.D.   On: 02/02/2023 22:05   MR BRAIN WO CONTRAST  Result Date: 01/29/2023 CLINICAL DATA:  Stroke, follow-up EXAM: MRI HEAD WITHOUT CONTRAST TECHNIQUE: Multiplanar, multiecho pulse sequences of the brain and surrounding structures were obtained without intravenous contrast. COMPARISON:  01/27/2023 MRI head, correlation is made with 01/29/2023 CT head FINDINGS: Brain: Again noted are scattered foci of restricted diffusion with ADC correlates in the left frontal, parietal, temporal, and occipital cortex and white matter and left caudate; several of these have increased in conspicuity since the prior exam in the vein consistent with acute infarcts. Likely new small acute focus of restricted diffusion with ADC correlate in the left corona radiata (series 9, image 27). No acute hemorrhage, mass, mass effect, or midline shift. No hydrocephalus or extra-axial collection. Confluent T2 hyperintense signal in the periventricular white matter and pons, likely the  sequela of moderate to severe chronic small vessel ischemic disease. Dilated perivascular spaces in the bilateral basal ganglia. Vascular: No seen on the prior CTA, poor visualization of the right V4 flow void. Otherwise normal arterial flow voids. Skull and upper cervical spine: Normal marrow signal. Sinuses/Orbits: Clear paranasal sinuses. No acute finding in the orbits. Status post bilateral lens replacements. Other: The mastoid air cells are well aerated. 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 Otherwise unchanged scattered acute to early subacute infarcts throughout the left cerebral hemisphere. No acute hemorrhage, mass effect, or midline shift. These results will be called to the ordering clinician or representative by the Radiologist Assistant, and communication documented in the PACS or [employer]. Electronically Signed   By: Wiliam Ke M.D.   On: 01/29/2023 23:58   CT ANGIO HEAD NECK W WO CM (CODE STROKE)  Result Date: 01/29/2023 CLINICAL DATA:  Neuro deficit, stroke suspected. Abnormal MRI with left-sided infarcts. EXAM: CT ANGIOGRAPHY HEAD AND NECK WITH AND WITHOUT CONTRAST TECHNIQUE: Multidetector CT imaging of the head and neck was performed using the standard protocol during bolus administration of intravenous contrast. Multiplanar CT image reconstructions and MIPs were obtained to evaluate the vascular anatomy. Carotid stenosis measurements (when applicable) are obtained utilizing NASCET criteria, using the distal internal carotid diameter as the denominator. RADIATION DOSE REDUCTION: This exam was performed according to the departmental dose-optimization program which includes automated exposure control, adjustment of the mA and/or kV according to patient size and/or use of iterative reconstruction technique. CONTRAST:  75mL OMNIPAQUE IOHEXOL 350 MG/ML SOLN COMPARISON:  MR head without contrast 01/27/2023. CT head without contrast 01/28/2023 and CT head without  contrast 01/29/2023. FINDINGS: CTA NECK FINDINGS Aortic arch: Atherosclerotic calcifications are again noted at the aortic arch and great vessel origins. No stenosis or aneurysm is present. Right  carotid system: The right common carotid artery is within normal limits. Mural calcifications are stable. The high-grade stenosis of greater than 80% is present at the proximal right ICA. Previous study underestimated the degree of stenosis. No significant interval change is present. Left carotid system: The left common carotid artery demonstrates extensive mural calcification without proximal stenosis. A high-grade, near occlusive stenosis is present in the proximal left ICA. More distal left ICA is slightly smaller in caliber than the right. Vertebral arteries: The vertebral arteries both originate from the subclavian arteries. The left vertebral artery is dominant. A 50% stenosis present at its origin. The right vertebral artery is hypoplastic. It is occluded in the V3 segment is proximal to the dura. No significant stenosis is present on the left. Skeleton: Multilevel degenerative changes are again noted within the cervical spine. No focal osseous lesions are present. Other neck: Soft tissues the neck are otherwise unremarkable. Salivary glands are within normal limits. Thyroid is normal. No significant adenopathy is present. No focal mucosal or submucosal lesions are present. Upper chest: Small bilateral pleural effusions are present. Mild dependent atelectasis is present. No nodule or mass lesion is present. No significant airspace disease is present otherwise. Review of the MIP images confirms the above findings CTA HEAD FINDINGS Anterior circulation: Atherosclerotic calcifications are again noted within the cavernous internal carotid arteries bilaterally. No significant stenosis or interval change is present. The A1 and M1 segments are normal bilaterally. The anterior communicating artery is patent. MCA bifurcations  within normal limits bilaterally. The ACA and MCA branch vessels are within normal limits. No aneurysm is present. Posterior circulation: Left vertebral artery is scratched at atherosclerotic changes are present at the dural margin of the left vertebral artery without a significant stenosis. Left PICA origin is visualized and normal. The right V4 segment fills to the PICA, likely in retrograde fashion. The basilar artery is normal. Both superior cerebellar arteries are patent. The left posterior cerebral artery originates from basilar tip. The right posterior cerebral artery is of fetal type with a small right P1 segment. A proximal right P2 segment stenosis is again noted. In mid left P2 segment stenosis is present. PCA branch vessels are otherwise unremarkable and stable. Venous sinuses: The dural sinuses are patent. The straight sinus and deep cerebral veins are intact. Cortical veins are within normal limits. No significant vascular malformation is evident. Anatomic variants: Fetal type right posterior cerebral artery. Review of the MIP images confirms the above findings IMPRESSION: 1. 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 2. 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 3. Stable 50% stenosis at the origin of the dominant left vertebral artery. 4. 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 5. Stable proximal right P2 segment stenosis. 6. Stable mid left P2 segment stenosis. 7. Stable atherosclerotic changes within the cavernous internal carotid arteries bilaterally without significant stenosis. 8. No significant proximal stenosis, aneurysm, or branch vessel occlusion within the Circle of Willis. 9.  Aortic Atherosclerosis (ICD10-I70.0). These results were called by telephone at the time of interpretation on 01/29/2023 at 4:31 Pm to provider [facility] , who verbally acknowledged these results. The  final report was delayed by technical issues. Electronically Signed   By: Marin Roberts M.D.   On: 01/29/2023 17:50   CT HEAD CODE STROKE WO CONTRAST  Result Date: 01/29/2023 CLINICAL DATA:  Code stroke.  Neuro deficit, acute, stroke suspected EXAM: CT HEAD WITHOUT  CONTRAST TECHNIQUE: Contiguous axial images were obtained from the base of the skull through the vertex without intravenous contrast. RADIATION DOSE REDUCTION: This exam was performed according to the departmental dose-optimization program which includes automated exposure control, adjustment of the mA and/or kV according to patient size and/or use of iterative reconstruction technique. COMPARISON:  None Available. FINDINGS: Brain: No evidence of acute infarction, hemorrhage, hydrocephalus, extra-axial collection or mass lesion/mass effect. Patchy white matter hypodensities, nonspecific but compatible with chronic microvascular ischemic disease. Vascular: No hyperdense vessel. Skull: No acute fracture. Sinuses/Orbits: Clear sinuses.  No acute orbital findings. Other: No mastoid effusions. ASPECTS [facility] Stroke Program Early CT Score) total score (0-10 with 10 being normal): 10. IMPRESSION: 1. No evidence of acute intracranial abnormality. 2. ASPECTS is 10. Code stroke imaging results were communicated on 01/29/2023 at 3:58 pm to provider Dr. Amada Jupiter via secure text paging. Electronically Signed   By: Feliberto Harts M.D.   On: 01/29/2023 15:58   ECHOCARDIOGRAM COMPLETE BUBBLE STUDY  Result Date: 01/28/2023    ECHOCARDIOGRAM REPORT   Patient Name:   DARLENE BROZOWSKI Raney Date of Exam: 01/28/2023 Medical Rec #:  [id number]        Height:       62.0 in Accession #:    [id number]       Weight:       193.1 lb Date of Birth:  04-Jun-1941        BSA:          1.883 m Patient Age:    81 years         BP:           112/61 mmHg Patient Gender: F                HR:           65 bpm. Exam Location:  [facility] Procedure: 2D Echo, Cardiac Doppler, Color  Doppler and Saline Contrast Bubble            Study Indications:     TIA 435.9 / G45.9  History:         Patient has prior history of Echocardiogram examinations, most                  recent 01/14/2021. Arrythmias:Atrial Fibrillation; Risk                  Factors:Hypertension.  Sonographer:     Cristela Blue Referring Phys:  [id number] JAN A MANSY Diagnosing Phys: Julien Nordmann MD  Sonographer Comments: Suboptimal apical window. IMPRESSIONS  1. Left ventricular ejection fraction, by estimation, is 60 to 65%. The left ventricle has normal function. The left ventricle has no regional wall motion abnormalities. Left ventricular diastolic parameters are indeterminate.  2. Right ventricular systolic function is normal. The right ventricular size is normal. There is normal pulmonary artery systolic pressure. The estimated right ventricular systolic pressure is 26.3 mmHg.  3. Left atrial size was moderately dilated.  4. Right atrial size was mildly dilated.  5. The mitral valve is normal in structure. Mild mitral valve regurgitation. No evidence of mitral stenosis.  6. Tricuspid valve regurgitation is mild to moderate.  7. The aortic valve is normal in structure. Aortic valve regurgitation is not visualized. Aortic valve sclerosis is present, with no evidence of aortic valve stenosis.  8. The inferior vena cava is normal in size with greater than 50% respiratory variability, suggesting right atrial pressure of 3 mmHg.  9. Agitated saline contrast  bubble study was negative, with no evidence of any interatrial shunt. FINDINGS  Left Ventricle: Left ventricular ejection fraction, by estimation, is 60 to 65%. The left ventricle has normal function. The left ventricle has no regional wall motion abnormalities. The left ventricular internal cavity size was normal in size. There is  no left ventricular hypertrophy. Left ventricular diastolic parameters are indeterminate. Right Ventricle: The right ventricular size is normal. No  increase in right ventricular wall thickness. Right ventricular systolic function is normal. There is normal pulmonary artery systolic pressure. The tricuspid regurgitant velocity is 2.31 m/s, and  with an assumed right atrial pressure of 5 mmHg, the estimated right ventricular systolic pressure is 26.3 mmHg. Left Atrium: Left atrial size was moderately dilated. Right Atrium: Right atrial size was mildly dilated. Pericardium: There is no evidence of pericardial effusion. Mitral Valve: The mitral valve is normal in structure. There is mild calcification of the mitral valve leaflet(s). Mild mitral annular calcification. Mild mitral valve regurgitation. No evidence of mitral valve stenosis. Tricuspid Valve: The tricuspid valve is normal in structure. Tricuspid valve regurgitation is mild to moderate. No evidence of tricuspid stenosis. Aortic Valve: The aortic valve is normal in structure. Aortic valve regurgitation is not visualized. Aortic valve sclerosis is present, with no evidence of aortic valve stenosis. Aortic valve mean gradient measures 8.3 mmHg. Aortic valve peak gradient measures 15.6 mmHg. Aortic valve area, by VTI measures 1.66 cm. Pulmonic Valve: The pulmonic valve was normal in structure. Pulmonic valve regurgitation is not visualized. No evidence of pulmonic stenosis. Aorta: The aortic root is normal in size and structure. Venous: The inferior vena cava is normal in size with greater than 50% respiratory variability, suggesting right atrial pressure of 3 mmHg. IAS/Shunts: No atrial level shunt detected by color flow Doppler. Agitated saline contrast was given intravenously to evaluate for intracardiac shunting. Agitated saline contrast bubble study was negative, with no evidence of any interatrial shunt. There  is no evidence of a patent foramen ovale. There is no evidence of an atrial septal defect.  LEFT VENTRICLE PLAX 2D LVIDd:         3.90 cm   Diastology LVIDs:         2.50 cm   LV e' medial:     8.16 cm/s LV PW:         0.80 cm   LV E/e' medial:  13.7 LV IVS:        1.40 cm   LV e' lateral:   11.10 cm/s LVOT diam:     2.00 cm   LV E/e' lateral: 10.1 LV SV:         67 LV SV Index:   36 LVOT Area:     3.14 cm  RIGHT VENTRICLE RV Basal diam:  3.70 cm RV Mid diam:    3.20 cm RV S prime:     13.10 cm/s TAPSE (M-mode): 1.3 cm LEFT ATRIUM             Index        RIGHT ATRIUM           Index LA diam:        4.10 cm 2.18 cm/m   RA Area:     22.60 cm LA Vol (A2C):   68.3 ml 36.26 ml/m  RA Volume:   74.90 ml  39.77 ml/m LA Vol (A4C):   82.3 ml 43.70 ml/m LA Biplane Vol: 80.7 ml 42.85 ml/m  AORTIC VALVE AV Area (Vmax):  1.49 cm AV Area (Vmean):   1.57 cm AV Area (VTI):     1.66 cm AV Vmax:           197.33 cm/s AV Vmean:          133.667 cm/s AV VTI:            0.406 m AV Peak Grad:      15.6 mmHg AV Mean Grad:      8.3 mmHg LVOT Vmax:         93.40 cm/s LVOT Vmean:        67.000 cm/s LVOT VTI:          0.214 m LVOT/AV VTI ratio: 0.53  AORTA Ao Root diam: 3.30 cm MITRAL VALVE                TRICUSPID VALVE MV Area (PHT): 4.26 cm     TR Peak grad:   21.3 mmHg MV Decel Time: 178 msec     TR Vmax:        231.00 cm/s MV E velocity: 112.00 cm/s                             SHUNTS                             Systemic VTI:  0.21 m                             Systemic Diam: 2.00 cm Julien Nordmann MD Electronically signed by Julien Nordmann MD Signature Date/Time: 01/28/2023/12:52:11 PM    Final    CT HEAD CODE STROKE WO CONTRAST`  Addendum Date: 01/28/2023   ADDENDUM REPORT: 01/28/2023 06:48 ADDENDUM: Study discussed by telephone with Dr. Valente David on 01/28/2023 at 0642 hours. Electronically Signed   By: Odessa Fleming M.D.   On: 01/28/2023 06:48   Result Date: 01/28/2023 CLINICAL DATA:  Code stroke. 82 year old female with weakness and altered mental status, change in stroke symptoms. Numerous scattered small foci of ischemia in the left hemisphere on MRI yesterday. EXAM: CT HEAD WITHOUT CONTRAST TECHNIQUE:  Contiguous axial images were obtained from the base of the skull through the vertex without intravenous contrast. RADIATION DOSE REDUCTION: This exam was performed according to the departmental dose-optimization program which includes automated exposure control, adjustment of the mA and/or kV according to patient size and/or use of iterative reconstruction technique. COMPARISON:  Brain MRI 2317 hours yesterday. Head CT 1859 hours yesterday. FINDINGS: Brain: No acute intracranial hemorrhage identified. No midline shift, mass effect, or evidence of intracranial mass lesion. Stable ventricle size and configuration. Patchy bilateral white matter hypodensity is stable. The multiple small foci of abnormal diffusion scattered in the left hemisphere on MRI last night are largely occult by CT. Stable gray-white matter differentiation throughout the brain. No new cortically based infarct identified. Vascular: No suspicious intracranial vascular hyperdensity. Calcified atherosclerosis at the skull base. Skull: No acute osseous abnormality identified. Mild motion artifact. Sinuses/Orbits: Visualized paranasal sinuses and mastoids are stable and well aerated. Other: No gaze deviation, acute orbit or scalp soft tissue finding. ASPECTS [facility] Stroke Program Early CT Score) Total score (0-10 with 10 being normal): 10 IMPRESSION: Stable noncontrast CT appearance of the brain since yesterday, the multiple small infarcts by MRI are largely occult by CT. No new intracranial abnormality. Electronically Signed: By:  Odessa Fleming M.D. On: 01/28/2023 06:37   MR BRAIN WO CONTRAST  Result Date: 01/28/2023 CLINICAL DATA: Confusion, right upper extremity weakness EXAM: MRI HEAD WITHOUT CONTRAST TECHNIQUE: Multiplanar, multiecho pulse sequences of the brain and surrounding structures were obtained without intravenous contrast. COMPARISON:  No prior MRI available, correlation is made with 01/27/2023 CT head and CTA head and neck FINDINGS:  Evaluation is significantly limited by motion, with the exception of the diffusion-weighted sequence. Brain: Scattered foci of cortical and white matter restricted diffusion with ADC correlates in the left cerebral hemisphere and left caudate (series 5, images 20-38). Evaluation for T2 hyperintense correlate is limited by motion artifact No definite acute hemorrhage, mass, mass effect, or midline shift. No hydrocephalus or extra-axial collection. Vascular: As seen on the prior CTA, the right V4 flow void is not well seen. Otherwise normal arterial flow voids. Skull and upper cervical spine: Grossly normal marrow signal. Sinuses/Orbits: Clear paranasal sinuses. Status post bilateral lens replacements. Other: The mastoid air cells are well aerated. IMPRESSION: Evaluation is significantly limited by motion, with the exception of the diffusion-weighted sequence. 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 These results were called by telephone at the time of interpretation on 01/28/2023 at 1:05 am to provider [facility] , who verbally acknowledged these results. Electronically Signed   By: Wiliam Ke M.D.   On: 01/28/2023 01:05   CT ANGIO HEAD NECK W WO CM  Result Date: 01/27/2023 CLINICAL DATA:  Confusion and right upper extremity weakness EXAM: CT ANGIOGRAPHY HEAD AND NECK WITH AND WITHOUT CONTRAST TECHNIQUE: Multidetector CT imaging of the head and neck was performed using the standard protocol during bolus administration of intravenous contrast. Multiplanar CT image reconstructions and MIPs were obtained to evaluate the vascular anatomy. Carotid stenosis measurements (when applicable) are obtained utilizing NASCET criteria, using the distal internal carotid diameter as the denominator. RADIATION DOSE REDUCTION: This exam was performed according to the departmental dose-optimization program which includes automated exposure control,  adjustment of the mA and/or kV according to patient size and/or use of iterative reconstruction technique. CONTRAST:  75mL OMNIPAQUE IOHEXOL 350 MG/ML SOLN COMPARISON:  No prior CTA available, correlation is made with CT head 01/27/2023 FINDINGS: CT HEAD FINDINGS For noncontrast findings, please see same day CT head. CTA NECK FINDINGS Aortic arch: Evaluation of the arch is somewhat limited by motion. Within this limitation, Two-vessel arch with a common origin of the brachiocephalic and left common carotid arteries. Imaged portion shows no evidence of aneurysm or dissection. No significant stenosis of the major arch vessel origins. Mild aortic atherosclerosis. Right carotid system: 60% stenosis in the proximal right ICA (series 6, image 194). Additional atherosclerotic in the right common carotid artery is not hemodynamically significant. Left carotid system: 70% stenosis in the proximal left ICA (series 6, image 199). Additional atherosclerotic disease in the common carotid artery is not hemodynamically significant. Vertebral arteries: Left dominant system. The left vertebral artery is patent from its origin to the skull base without significant stenosis or evidence of dissection. The right vertebral artery is quite diminutive, and poorly perfused in the distal V3 segment (series 6, image 159), likely reflecting moderate stenosis. Skeleton: No acute osseous abnormality. Degenerative changes in the cervical spine. Other neck: Hypoenhancing and calcified nodules in the thyroid, the largest of which measures up to 6 mm, for which no follow-up is currently indicated. (Reference: J Am Coll Radiol. 2015 Feb;12(2): 143-50) Upper chest:  No focal pulmonary opacity or pleural effusion. Mild centrilobular emphysema. Review of the MIP images confirms the above findings CTA HEAD FINDINGS Anterior circulation: Both internal carotid arteries are patent to the termini, with mild stenosis in the bilateral cavernous and supraclinoid  segments. A1 segments patent. Normal anterior communicating artery. Anterior cerebral arteries are patent to their distal aspects without significant stenosis. No M1 stenosis or occlusion. MCA branches are irregular but perfused to their distal aspects without significant stenosis. Posterior circulation: Nonopacification of the proximal right V4 (series 6, image 146), consistent with occlusion. The more distal right V4 is opacified, likely retrograde. The left vertebral artery is patent to the vertebrobasilar junction with mild stenosis in the proximal V4 (series 6, image 141). Posteroinferior cerebellar arteries are not definitively visualized. Basilar patent to its distal aspect without significant stenosis. Superior cerebellar arteries patent proximally. Patent P1 segments. Mild stenosis in the proximal right P2 (series 6, image 99), and mid left P 2 (series 6, image 105). The remainder of the PCAs are irregular but do not demonstrate significant stenosis. The right posterior communicating artery is patent. The left is not definitively seen. Venous sinuses: As permitted by contrast timing, patent. Anatomic variants: None significant. Review of the MIP images confirms the above findings IMPRESSION: 1. 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 This is of indeterminate acuity. 2. 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 3. Mild stenosis in the bilateral cavernous and supraclinoid ICAs. 4. Mild stenosis in the proximal right P2 and mid left P2. 5. Aortic atherosclerosis. 6. Emphysema. Aortic Atherosclerosis (ICD10-I70.0) and Emphysema (ICD10-J43.9). These results were called by telephone at the time of interpretation on 01/27/2023 at 10:07 pm to provider [facility] , who verbally acknowledged these results. Electronically Signed   By: Wiliam Ke M.D.   On: 01/27/2023 22:07   CT Head Wo Contrast  Result Date:  01/27/2023 CLINICAL DATA:  Confusion. EXAM: CT HEAD WITHOUT CONTRAST TECHNIQUE: Contiguous axial images were obtained from the base of the skull through the vertex without intravenous contrast. RADIATION DOSE REDUCTION: This exam was performed according to the departmental dose-optimization program which includes automated exposure control, adjustment of the mA and/or kV according to patient size and/or use of iterative reconstruction technique. COMPARISON:  Head CT dated 10/03/2021. FINDINGS: Brain: Mild age-related atrophy and moderate chronic microvascular ischemic changes. There is no acute intracranial hemorrhage. No mass effect or midline shift. No extra-axial fluid collection. Vascular: No hyperdense vessel or unexpected calcification. Skull: Normal. Negative for fracture or focal lesion. Sinuses/Orbits: No acute finding. Other: None IMPRESSION: 1. No acute intracranial pathology. 2. Mild age-related atrophy and moderate chronic microvascular ischemic changes. Electronically Signed   By: Elgie Collard M.D.   On: 01/27/2023 19:14   DG HIP UNILAT W OR W/O PELVIS 2-3 VIEWS RIGHT  Result Date: 01/18/2023 CLINICAL DATA:  Status post right hip arthroplasty EXAM: DG HIP (WITH OR WITHOUT PELVIS) 2-3V RIGHT COMPARISON:  None Available. FINDINGS: There is recent right hip arthroplasty. No fracture is seen. Surgical drain is noted in place. There are pockets of air and skin staples from recent surgery. IMPRESSION: Status post right hip arthroplasty. Electronically Signed   By: Ernie Avena M.D.   On: 01/18/2023 12:46    Labs:  Basic Metabolic Panel: Recent Labs  Lab 02/03/23 0612 02/09/23 0725  NA 134* 137  K 3.6 3.6  CL 99 103  CO2  28 27  GLUCOSE 142* 138*  BUN 22 19  CREATININE 1.23* 1.14*  CALCIUM 8.6* 8.9    CBC: Recent Labs  Lab 02/03/23 0612  WBC 9.9  NEUTROABS 7.9*  HGB 8.2*  HCT 25.3*  MCV 96.2  PLT 366    CBG: Recent Labs  Lab 02/08/23 1652  GLUCAP 150*   Family  history.  Mother with myocardial infarction father with CVA and diabetes mellitus.  Negative for breast cancer esophageal cancer or rectal cancer  Brief HPI:   Brooke Davis is a 82 y.o. 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 Francesco Sor, CAD with PCI and stenting, diastolic congestive heart failure.  Per chart review patient modified independent with rollator prior to admission.  She lives with family and assistance as needed.  Presented to [facility] 01/27/2023 with speech difficulty.  It was noted the patient had been convalescing at home after recent hip surgery she had held her Eliquis for 3 days prior to that surgery.  Cranial CT scan negative for acute changes.  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  50% stenosis of the origin of the dominant left vertebral artery.  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  Patient did not receive TNK.  Admission chemistry is unremarkable except sodium 134 glucose 123 hemoglobin 8.8.  Echocardiogram with ejection fraction of 60 to 65% no wall motion abnormalities.  Neurology follow-up maintained on Eliquis as prior to admission with the addition of low-dose aspirin.  Vascular surgery consulted in regards to bilateral carotid artery stenosis plan outpatient follow-up.  Palliative care consulted to establish goals of care.  Patient weightbearing as tolerated right lower extremity after recent right total hip arthroplasty.  Therapy evaluations completed due to patient's decreased functional mobility was admitted for comprehensive  rehab program.   Hospital Course: NATHALYA WOLANSKI was admitted to rehab 02/02/2023 for inpatient therapies to consist of PT, ST and OT at least three hours five days a week. Past admission physiatrist, therapy team and rehab RN have worked together to provide customized collaborative inpatient rehab.  Pertaining to patient's left corona radiata infarction remained stable she will continue on aspirin as well as Eliquis as directed with follow-up neurology services.  Pain managed her use of oxycodone as well as tramadol as needed.  Mood stabilization with scheduled Celexa Klonopin as needed emotional support provided.  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 Francesco Sor.  Weightbearing as tolerated.  Posterior hip precautions.  Acute blood loss anemia stable no bleeding episodes.  History of CAD with PCI and stenting no chest pain or shortness of breath continue low-dose aspirin.  Atrial fibrillation followed by cardiology services continue Lopressor as directed cardiac rate controlled.  Diastolic congestive heart failure with Demadex and Entresto exhibiting no signs of fluid overload.  Lipitor ongoing for hyperlipidemia.  History of gout maintained on Zyloprim with no gout flareups.  Hemoglobin A1c 6.1 patient on no medications prior to admission blood sugar checks have been discontinued.  Bilateral carotid stenosis plan follow-up outpatient vascular surgery Dr. Wyn Quaker.   Blood pressures were monitored on TID basis and  soft and monitored  Diabetes has been monitored with ac/hs CBG checks and SSI was use prn for tighter BS control.    Rehab course: During patient's stay in rehab weekly team conferences were held to monitor patient's progress, set goals and discuss barriers to discharge. At admission, patient required supervision lateral scoot transfers minimal assist 4 feet rolling walker  Physical exam.  Blood pressure 114/52 pulse 79 temperature 98.6 respirations 20 oxygen saturation is 96%  room air Constitutional.  No acute distress HEENT Head.  Normocephalic and atraumatic Eyes.  Pupils round and reactive to light no discharge without nystagmus Neck.  Supple nontender no JVD without thyromegaly Cardiac regular rate and rhythm without any extra sounds or murmur heard Abdomen.  Soft nontender positive bowel sounds without rebound Respiratory effort normal no respiratory distress without wheeze Musculoskeletal Left upper extremity 5/5 Right upper extremity just slightly less than 5 -/5 Left lower extremity 5/5 in hip flexors knee extension knee flexors and plantar dorsiflexion Right lower extremity 4 -/5 limited by pain Neurologic.  Alert provides name and age some delay in processing.  He/She  has had improvement in activity tolerance, balance, postural control as well as ability to compensate for deficits. He/She has had improvement in functional use RUE/LUE  and RLE/LLE as well as improvement in awareness.  Perform supine to sit modified independent.  Contact-guard for right lower extremity.  Sit to stand and stand pivot transfers throughout session supervision modified independent with rolling walker.  Ambulatory toilet transfers performed with supervision and patient able to manage clothing and toileting without physical assist and distant supervision modified independent.  PeriCare with independence.  Ambulates 130 feet contact-guard.  Patient completed functional ambulation to the sink with rollator and supervision.  Speech therapy follow-up for receptive expressive language deficits.  SLP guided patient through word finding activities including description based vocabulary task divergent naming utilization and description strategies.  Patient continues to exhibit mild word finding deficits.  Full family teaching completed plan discharge to home       Disposition: Discharge to home    Diet: Carb modified  Special Instructions: No driving smoking or  alcohol  Weightbearing as tolerated with posterior hip precautions right lower extremity  Medications at discharge 1.  Tylenol as needed 2.  Zyloprim 300 mg p.o. daily 3.  Eliquis 5 mg p.o. twice daily 4.  Aspirin 81 mg p.o. daily 5.  Lipitor 80 mg p.o. daily 6.  Celexa 20 mg p.o. daily 7.  Klonopin 0.5 mg p.o. 3 times daily as needed anxiety 8.  Melatonin 5 mg p.o. nightly 9.  Lopressor 25 mg p.o. twice daily 10.  Oxycodone 5 mg p.o. every 4 hours as needed pain 11.  Klor-Con 10 mEq p.o. daily 12.  Entresto 24-26 mg p.o. twice daily 13.  Demadex 20 mg p.o. daily  30-35 minutes were spent completing discharge summary and discharge planning  Discharge Instructions     Ambulatory referral to Neurology   Complete by: As directed    An appointment is requested in approximately: 4 weeks embolic cerebral infarction   Ambulatory referral to Physical Medicine Rehab   Complete by: As directed    Moderate complexity follow-up 1 to 2 weeks embolic infarction        Follow-up Information     Kirsteins, Victorino Sparrow, MD Follow up.   Specialty: Physical Medicine and Rehabilitation Why: Office to call for appointment Contact information: [address] [place] [address] [phone]  Laurier Nancy, MD Follow up.   Specialty: Cardiology Why: Call for appointment Contact information: [address] [place] [address] [phone]         Annice Needy, MD Follow up.   Specialties: Vascular Surgery, Radiology, Interventional Cardiology Why: Evaluate and treat bilateral carotid stenosis Contact information: [address] [place] [address] [phone]         Donato Heinz, MD Follow up.   Specialty: Orthopedic Surgery Why: Call for appointment Contact information: [address] [facility] [place] [facility] [place] [address] [phone]                 Signed: Mcarthur Rossetti Niccolas Loeper 02/10/2023, 5:10 AM

## 2023-02-10 MED ORDER — MELATONIN 5 MG PO TABS: 5.0000 mg | ORAL_TABLET | Freq: Every day | ORAL | 0 refills | Status: AC

## 2023-02-10 MED ORDER — ATORVASTATIN CALCIUM 80 MG PO TABS: 80.0000 mg | ORAL_TABLET | Freq: Every day | ORAL | 0 refills | Status: DC

## 2023-02-10 MED ORDER — APIXABAN 5 MG PO TABS: 5.0000 mg | ORAL_TABLET | Freq: Two times a day (BID) | ORAL | 0 refills | Status: DC

## 2023-02-10 MED ORDER — CLONAZEPAM 0.5 MG PO TABS: 0.5000 mg | ORAL_TABLET | Freq: Three times a day (TID) | ORAL | 0 refills | Status: DC | PRN

## 2023-02-10 MED ORDER — TORSEMIDE 20 MG PO TABS: 20.0000 mg | ORAL_TABLET | Freq: Every day | ORAL | 0 refills | Status: AC

## 2023-02-10 MED ORDER — OXYCODONE HCL 5 MG PO TABS: 5.0000 mg | ORAL_TABLET | ORAL | 0 refills | Status: DC | PRN

## 2023-02-10 MED ORDER — ACETAMINOPHEN 325 MG PO TABS: 650.0000 mg | ORAL_TABLET | Freq: Four times a day (QID) | ORAL | Status: AC | PRN

## 2023-02-10 MED ORDER — CITALOPRAM HYDROBROMIDE 20 MG PO TABS: 20.0000 mg | ORAL_TABLET | Freq: Every day | ORAL | 0 refills | Status: AC

## 2023-02-10 MED ORDER — ALLOPURINOL 300 MG PO TABS: 300.0000 mg | ORAL_TABLET | Freq: Every day | ORAL | 0 refills | Status: AC

## 2023-02-10 MED ORDER — POTASSIUM CHLORIDE ER 10 MEQ PO TBCR: 10.0000 meq | EXTENDED_RELEASE_TABLET | Freq: Every day | ORAL | 0 refills | Status: AC

## 2023-02-10 MED ORDER — ENTRESTO 24-26 MG PO TABS: 1.0000 | ORAL_TABLET | Freq: Two times a day (BID) | ORAL | 0 refills | Status: DC

## 2023-02-10 MED ORDER — METOPROLOL TARTRATE 25 MG PO TABS: 25.0000 mg | ORAL_TABLET | Freq: Two times a day (BID) | ORAL | 0 refills | Status: DC

## 2023-02-10 NOTE — Progress Notes (Signed)
Brooke Hoyles, PA Provided discharge instructions and prescriptions sent to outpatient pharmacy. Staff assisted patient off unit; patient left via private car with family member.    Kendle Turbin A. Robi Mitter,LPN

## 2023-02-10 NOTE — Progress Notes (Signed)
Patient ID: Brooke Davis, female   DOB: May 19, 1941, 82 y.o.   MRN: [id number]  [facility] orders sent to [facility].

## 2023-02-10 NOTE — Progress Notes (Signed)
PROGRESS NOTE   Subjective/Complaints:  Pt up in chair. Bags packed. Excited about going home. Birthday is Friday!  ROS: Patient denies fever, rash, sore throat, blurred vision, dizziness, nausea, vomiting, diarrhea, cough, shortness of breath or chest pain,   headache, or mood change.     Objective:   No results found. No results for input(s): "WBC", "HGB", "HCT", "PLT" in the last 72 hours.  Recent Labs    02/09/23 0725  NA 137  K 3.6  CL 103  CO2 27  GLUCOSE 138*  BUN 19  CREATININE 1.14*  CALCIUM 8.9     Intake/Output Summary (Last 24 hours) at 02/10/2023 0848 Last data filed at 02/10/2023 0737 Gross per 24 hour  Intake 1037 ml  Output --  Net 1037 ml        Physical Exam: Vital Signs Blood pressure (!) 117/96, pulse 73, temperature 98.3 F (36.8 C), temperature source Oral, resp. rate 16, height 5\' 2"  (1.575 m), weight 82 kg, SpO2 99%.   Constitutional: No distress . Vital signs reviewed. HEENT: NCAT, EOMI, oral membranes moist Neck: supple Cardiovascular: RRR without murmur. No JVD    Respiratory/Chest: CTA Bilaterally without wheezes or rales. Normal effort    GI/Abdomen: BS +, non-tender, non-distended Ext: no clubbing, cyanosis, or edema Psych: pleasant and cooperative  Skin: + Right hip incision CDI  Neurologic: Cranial nerves II through XII intact, Strength 5 out of 5 throughout bilateral upper and left lower extremity, 3-5 left hip flexion, 4 out of 5 knee extension limited by pain - unchanged Sensory exam normal sensation to light touch  in bilateral upper and lower extremities Cerebellar exam normal finger to nose to finger Musculoskeletal: Full range of motion in all 4 extremities. No joint swelling   Assessment/Plan: 1. Functional deficits which require 3+ hours per day of interdisciplinary therapy in a comprehensive inpatient rehab setting. Physiatrist is providing close team  supervision and 24 hour management of active medical problems listed below. Physiatrist and rehab team continue to assess barriers to discharge/monitor patient progress toward functional and medical goals  Care Tool:  Bathing    Body parts bathed by patient: Right arm, Left arm, Abdomen, Front perineal area, Right upper leg, Left upper leg, Face, Buttocks, Left lower leg, Right lower leg, Chest   Body parts bathed by helper: Buttocks, Right lower leg, Left lower leg     Bathing assist Assist Level: Independent with assistive device     Upper Body Dressing/Undressing Upper body dressing   What is the patient wearing?: Pull over shirt    Upper body assist Assist Level: Independent    Lower Body Dressing/Undressing Lower body dressing      What is the patient wearing?: Underwear/pull up, Pants     Lower body assist Assist for lower body dressing: Independent with assitive device     Toileting Toileting Toileting Activity did not occur (Clothing management and hygiene only): N/A (no void or bm)  Toileting assist Assist for toileting: Independent with assistive device     Transfers Chair/bed transfer  Transfers assist     Chair/bed transfer assist level: Independent with assistive device Chair/bed transfer assistive device: Dan Humphreys  Locomotion Ambulation   Ambulation assist      Assist level: Independent with assistive device Assistive device: Walker-rolling Max distance: 150   Walk 10 feet activity   Assist     Assist level: Independent with assistive device Assistive device: Walker-rolling   Walk 50 feet activity   Assist Walk 50 feet with 2 turns activity did not occur: Safety/medical concerns  Assist level: Independent with assistive device Assistive device: Walker-rolling    Walk 150 feet activity   Assist Walk 150 feet activity did not occur: Safety/medical concerns  Assist level: Independent with assistive device Assistive device:  Walker-rolling    Walk 10 feet on uneven surface  activity   Assist Walk 10 feet on uneven surfaces activity did not occur: Safety/medical concerns   Assist level: Independent with assistive device Assistive device: Walker-rolling   Wheelchair     Assist Is the patient using a wheelchair?: Yes Type of Wheelchair: Manual    Wheelchair assist level: Independent Max wheelchair distance: 50    Wheelchair 50 feet with 2 turns activity    Assist        Assist Level: Independent   Wheelchair 150 feet activity     Assist      Assist Level: Independent   Blood pressure (!) 117/96, pulse 73, temperature 98.3 F (36.8 C), temperature source Oral, resp. rate 16, height 5\' 2"  (1.575 m), weight 82 kg, SpO2 99%. Medical Problem List and Plan: 1. 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 Goals supervision to min A Dc home today. Follow up with Dr. Kirtland Bouchard and ortho, pcp, vascular surgery     2.  Antithrombotics: -DVT/anticoagulation: Eliquis             -antiplatelet therapy: Aspirin 81 mg daily 3. Pain Management: Oxycodone/tramadol as needed- mainly taking tramadol prn- for R THP done 01/18/23 4. Mood/Behavior/Sleep: Celexa 20 mg daily, Klonopin 0.5 mg 3 times daily as needed             -antipsychotic agents: N/A 5. Neuropsych/cognition: This patient is capable of making decisions on her own behalf. 6. Skin/Wound Care:      - staples out--wound healing nicely 7. Fluids/Electrolytes/Nutrition: encourage adequate po  -might have mild renal insuffiency  -encourage adequate fluids and po intake in general  -see volume discussion below 8.  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 Francesco Sor.  Weightbearing as tolerated- honeycomb still in place- per pt, wanted to leave  staples "a few more days". Posterior hip precautions   9.  Acute blood loss anemia after THA.  Hgb trending down. No gross blood loss   -continue to monitor--recheck later this week 10.  CAD/PCI and stenting.  Continue aspirin therapy 11.  Atrial fibrillation.  Maintained on Eliquis as well as Lopressor 25 mg twice daily.  Cardiac rate controlled.  Follow-up Dr.Shaukat Delbert [place].  Diastolic congestive heart failure.  Demadex 20 mg daily, Entresto 24-26 mg twice daily.  Monitor for any signs of fluid overload Filed Weights   02/02/23 1624 02/09/23 0500 02/09/23 2005  Weight: 83.6 kg 85.2 kg 82 kg      -weights appear stable--continue with above meds--f/u with cards 13.  Hyperlipidemia.  Lipitor 14.  History  of gout.  Zyloprim 300 mg daily.  Monitor for any gout flareups 15.  Type 2 diabetes mellitus.  Hemoglobin A1c 6.1.  Patient on no medication prior to admission.  CBGs discontinued. 16.  Bilateral carotid stenosis.  Follow-up outpatient vascular surgery Dr. Wyn Quaker  -it appears that they are discussing CEA in the next couple weeks    LOS: 8 days A FACE TO FACE EVALUATION WAS PERFORMED  Ranelle Oyster 02/10/2023, 8:48 AM

## 2023-02-16 ENCOUNTER — Ambulatory Visit (INDEPENDENT_AMBULATORY_CARE_PROVIDER_SITE_OTHER): Payer: Medicare Other | Admitting: Vascular Surgery

## 2023-02-16 ENCOUNTER — Encounter (INDEPENDENT_AMBULATORY_CARE_PROVIDER_SITE_OTHER): Payer: Self-pay | Admitting: Vascular Surgery

## 2023-02-16 VITALS — BP 139/68 | HR 76 | Resp 16

## 2023-02-16 DIAGNOSIS — I10 Essential (primary) hypertension: Secondary | ICD-10-CM | POA: Diagnosis not present

## 2023-02-16 DIAGNOSIS — I63412 Cerebral infarction due to embolism of left middle cerebral artery: Secondary | ICD-10-CM | POA: Diagnosis not present

## 2023-02-16 DIAGNOSIS — I6523 Occlusion and stenosis of bilateral carotid arteries: Secondary | ICD-10-CM | POA: Diagnosis not present

## 2023-02-16 DIAGNOSIS — E1165 Type 2 diabetes mellitus with hyperglycemia: Secondary | ICD-10-CM

## 2023-02-16 DIAGNOSIS — E785 Hyperlipidemia, unspecified: Secondary | ICD-10-CM

## 2023-02-16 MED ORDER — CLOPIDOGREL BISULFATE 75 MG PO TABS: 75.0000 mg | ORAL_TABLET | Freq: Every day | ORAL | 6 refills | Status: DC

## 2023-02-16 NOTE — H&P (View-Only) (Signed)
MRN : [id number]  Brooke Davis is a 82 y.o. (10/09/1940) female who presents with chief complaint of  Chief Complaint  Patient presents with   Follow-up    Evaluate bilateral carotid stenosis   .  History of Present Illness: Patient returns today in follow up of her carotid disease.  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  As part of her workup, she underwent a CT angiogram of the neck which I have independently reviewed.  This is interpreted as a 60% right carotid stenosis and a 70% left carotid stenosis.  I think this is reasonable although I think the degree of stenosis is slightly worse on the left side.  Nonetheless, with a clearly symptomatic greater than 60% lesion, we discussed intervention for her left carotid stenosis while she was in the hospital and a follow-up of 1 to 2 weeks to allow brain healing and less risk of reperfusion injury.  She has done well in that time.  No recurrent symptoms.  No new complaints.  Current Outpatient Medications  Medication Sig Dispense Refill   acetaminophen (TYLENOL) 325 MG tablet Take 2 tablets (650 mg total) by mouth every 6 (six) hours as needed for mild pain (or Fever >/= 101).     allopurinol (ZYLOPRIM) 300 MG tablet Take 1 tablet (300 mg total) by mouth daily. 30 tablet 0   apixaban (ELIQUIS) 5 MG TABS tablet Take 1 tablet (5 mg total) by mouth 2 (two) times daily. 60 tablet 0   aspirin EC 81 MG tablet Take 1 tablet (81 mg total) by mouth daily. Swallow whole.     atorvastatin (LIPITOR) 80 MG tablet Take 1 tablet (80 mg total) by mouth daily. 30 tablet 0   citalopram (CELEXA) 20 MG tablet Take 1 tablet (20 mg total) by mouth daily. 30 tablet 0   clonazePAM (KLONOPIN) 0.5 MG tablet Take 1 tablet (0.5 mg total) by mouth 3 (three) times daily as needed (anxiety). 10 tablet 0   clopidogrel (PLAVIX) 75 MG tablet Take 1 tablet (75 mg total) by  mouth daily. 30 tablet 6   melatonin 5 MG TABS Take 1 tablet (5 mg total) by mouth at bedtime. 30 tablet 0   metoprolol tartrate (LOPRESSOR) 25 MG tablet Take 1 tablet (25 mg total) by mouth 2 (two) times daily. 60 tablet 0   oxyCODONE (OXY IR/ROXICODONE) 5 MG immediate release tablet Take 1 tablet (5 mg total) by mouth every 4 (four) hours as needed for moderate pain (pain score 4-6). 30 tablet 0   potassium chloride (KLOR-CON) 10 MEQ tablet Take 1 tablet (10 mEq total) by mouth daily. 30 tablet 0   sacubitril-valsartan (ENTRESTO) 24-26 MG Take 1 tablet by mouth 2 (two) times daily. 60 tablet 0   torsemide (DEMADEX) 20 MG tablet Take 1 tablet (20 mg total) by mouth daily. 30 tablet 0   No current facility-administered medications for this visit.    Past Medical History:  Diagnosis Date   Anxiety    a.) on BZO (alprazolam) PRN   Arthritis    fingers, right hip   Atrial fibrillation (HCC) 11/10/2013   a.) CHA2DS2VASc = 5 (age x2, sex, HTN, T2DM);  b.) rate/rhythm maintained on oral metoprolol tartrate; chronically anticoagulated with apixaban   Bronchitis    Cerebrovascular small vessel disease (chronic)    Coronary artery disease involving native coronary artery of  native heart 01/11/2021   a.) LHC 01/11/2021: 40% oLM, 50% mLCx, 30% oLCx, 30% pLAD, 30% mLAD, 99% pRCA (2.75 x 30 mm Resolute Onyx DES), 75% p-mRCA, 60% dRCA   DDD (degenerative disc disease), lumbar 12/01/2013   Diastolic dysfunction 01/12/2021   a.) TTE 01/12/2021 (s/p inf STEMI): EF 55-60%, mild-mod LAE, mild RAE, mod RVE, mild AR/MR, mod TR, G1DD   Diet-controlled type 2 diabetes mellitus (HCC)    Full dentures    Gout    History of bilateral cataract extraction 2021   Hyperlipidemia    Hypertension, essential    Long term current use of anticoagulant    a.) apixaban   PONV (postoperative nausea and vomiting)    Sinoatrial node dysfunction (HCC) 11/10/2013   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  All OK.   CATARACT EXTRACTION W/PHACO Left 03/27/2020   Procedure: CATARACT EXTRACTION PHACO AND INTRAOCULAR LENS PLACEMENT (IOC) LEFT 7.49  00:56.4  13.3%;  Surgeon: Lockie Mola, MD;  Location: [facility];  Service: Ophthalmology;  Laterality: Left;   CATARACT EXTRACTION W/PHACO Right 04/24/2020   Procedure: CATARACT EXTRACTION PHACO AND INTRAOCULAR LENS PLACEMENT (IOC) RIGHT;  Surgeon: Lockie Mola, MD;  Location: [facility];  Service: Ophthalmology;  Laterality: Right;  8.34 0:50.2 16.6%   CORONARY/GRAFT ACUTE MI REVASCULARIZATION N/A 01/11/2021   Procedure: Coronary/Graft Acute MI Revascularization;  Surgeon: Marcina Millard, MD;  Location: [facility];  Service: Cardiovascular;  Laterality: N/A;   LEFT HEART CATH AND CORONARY ANGIOGRAPHY N/A 01/11/2021   Procedure: LEFT HEART CATH AND CORONARY ANGIOGRAPHY;  Surgeon: Marcina Millard, MD;  Location: [facility];  Service: Cardiovascular;  Laterality: N/A;   PARTIAL HYSTERECTOMY     TOE SURGERY Left    great toe; rod in place   TOTAL HIP ARTHROPLASTY Right 01/18/2023   Procedure: TOTAL HIP ARTHROPLASTY;  Surgeon: Donato Heinz, MD;  Location: [facility];  Service: Orthopedics;  Laterality: Right;   TOTAL KNEE ARTHROPLASTY Right 2007     Social History   Tobacco Use   Smoking status: Former    Current packs/day: 0.00    Types: Cigarettes    Quit date: 1980    Years since quitting: 44.6   Smokeless tobacco: Never  Vaping Use   Vaping status: Never Used  Substance Use Topics   Alcohol use: Not Currently   Drug use: Never       Family History  Problem  Relation Age of Onset   Heart attack Mother 27       required open heart surgery. Onset age is unknown.   Stroke Father    Diabetes Father    Alcoholism Brother    Breast cancer Neg Hx      Allergies  Allergen Reactions   Celebrex [Celecoxib] Diarrhea   Penicillins Itching    IgE = 154 (WNL) on 01/05/2023   Relafen [Nabumetone]     Palpitation    Lovastatin Diarrhea     REVIEW OF SYSTEMS (Negative unless checked)  Constitutional: [] Weight loss  [] Fever  [] Chills Cardiac: [] Chest pain   []   Chest pressure   [x] Palpitations   [] Shortness of breath when laying flat   [] Shortness of breath at rest   [x] Shortness of breath with exertion. Vascular:  [] Pain in legs with walking   [] Pain in legs at rest   [] Pain in legs when laying flat   [] Claudication   [] Pain in feet when walking  [] Pain in feet at rest  [] Pain in feet when laying flat   [] History of DVT   [] Phlebitis   [] Swelling in legs   [] Varicose veins   [] Non-healing ulcers Pulmonary:   [] Uses home oxygen   [] Productive cough   [] Hemoptysis   [] Wheeze  [] COPD   [] Asthma Neurologic:  [] Dizziness  [] Blackouts   [] Seizures   [x] History of stroke   [] History of TIA  [] Aphasia   [] Temporary blindness   [] Dysphagia   [] Weakness or numbness in arms   [] Weakness or numbness in legs Musculoskeletal:  [x] Arthritis   [] Joint swelling   [x] Joint pain   [] Low back pain Hematologic:  [] Easy bruising  [] Easy bleeding   [] Hypercoagulable state   [] Anemic   Gastrointestinal:  [] Blood in stool   [] Vomiting blood  [x] Gastroesophageal reflux/heartburn   [] Abdominal pain Genitourinary:  [] Chronic kidney disease   [] Difficult urination  [] Frequent urination  [] Burning with urination   [] Hematuria Skin:  [] Rashes   [] Ulcers   [] Wounds Psychological:  [x] History of anxiety   []  History of major depression.  Physical Examination  BP 139/68 (BP Location: Right Arm)   Pulse 76   Resp 16  Gen:  WD/WN, NAD Head: [place]/AT, No temporalis  wasting. Ear/Nose/Throat: Hearing grossly intact, nares w/o erythema or drainage Eyes: Conjunctiva clear. Sclera non-icteric Neck: Supple.  Trachea midline Pulmonary:  Good air movement, no use of accessory muscles.  Cardiac: somewhat irregular Vascular:  Vessel Right Left  Radial Palpable Palpable               Musculoskeletal: M/S 5/5 throughout.  No deformity or atrophy. Trace LE edema. Neurologic: Sensation grossly intact in extremities.  Symmetrical.  Speech is fluent.  Psychiatric: Judgment intact, Mood & affect appropriate for pt's clinical situation. Dermatologic: No rashes or ulcers noted.  No cellulitis or open wounds.      Labs Recent Results (from the past 2160 hour(s))  Urinalysis, Routine w reflex microscopic -Urine, Clean Catch     Status: Abnormal   Collection Time: 01/05/23 10:49 AM  Result Value Ref Range   Color, Urine YELLOW (A) YELLOW   APPearance HAZY (A) CLEAR   Specific Gravity, Urine 1.013 1.005 - 1.030   pH 7.0 5.0 - 8.0   Glucose, UA NEGATIVE NEGATIVE mg/dL   Hgb urine dipstick NEGATIVE NEGATIVE   Bilirubin Urine NEGATIVE NEGATIVE   Ketones, ur NEGATIVE NEGATIVE mg/dL   Protein, ur NEGATIVE NEGATIVE mg/dL   Nitrite NEGATIVE NEGATIVE   Leukocytes,Ua TRACE (A) NEGATIVE   RBC / HPF 0-5 0 - 5 RBC/hpf   WBC, UA 0-5 0 - 5 WBC/hpf   Bacteria, UA RARE (A) NONE SEEN   Squamous Epithelial / HPF 6-10 0 - 5 /HPF    Comment: Performed at [facility], [address] [place], [place] [address]  Type and screen [facility]     Status: None   Collection Time: 01/05/23 10:49 AM  Result Value Ref Range   ABO/RH(D) A NEG    Antibody Screen NEG    Sample Expiration 01/19/2023,2359    Extend sample reason      NO TRANSFUSIONS  OR PREGNANCY IN THE PAST 3 MONTHS Performed at [facility], [address] [place], [place] [address]   Surgical pcr screen     Status: Abnormal   Collection Time: 01/05/23 10:49 AM    Specimen: Nasal Mucosa; Nasal Swab  Result Value Ref Range   MRSA, PCR NEGATIVE NEGATIVE   Staphylococcus aureus POSITIVE (A) NEGATIVE    Comment: (NOTE) The Xpert SA Assay (FDA approved for NASAL specimens in patients 68 years of age and older), is one component of a comprehensive surveillance program. It is not intended to diagnose infection nor to guide or monitor treatment. Performed at [facility], [address] [place], [place] [address]   CBC     Status: Abnormal   Collection Time: 01/05/23 10:51 AM  Result Value Ref Range   WBC 6.8 4.0 - 10.5 K/uL   RBC 3.02 (L) 3.87 - 5.11 MIL/uL   Hemoglobin 9.9 (L) 12.0 - 15.0 g/dL   HCT 55.7 (L) 32.2 - 02.5 %   MCV 102.6 (H) 80.0 - 100.0 fL   MCH 32.8 26.0 - 34.0 pg   MCHC 31.9 30.0 - 36.0 g/dL   RDW 42.7 06.2 - 37.6 %   Platelets 220 150 - 400 K/uL   nRBC 0.0 0.0 - 0.2 %    Comment: Performed at [facility], [address] [place], [place] [address]  Comprehensive metabolic panel     Status: Abnormal   Collection Time: 01/05/23 10:51 AM  Result Value Ref Range   Sodium 138 135 - 145 mmol/L   Potassium 4.2 3.5 - 5.1 mmol/L   Chloride 100 98 - 111 mmol/L   CO2 30 22 - 32 mmol/L   Glucose, Bld 134 (H) 70 - 99 mg/dL    Comment: Glucose reference range applies only to samples taken after fasting for at least 8 hours.   BUN 29 (H) 8 - 23 mg/dL   Creatinine, Ser 1.76 (H) 0.44 - 1.00 mg/dL   Calcium 9.2 8.9 - 16.0 mg/dL   Total Protein 6.9 6.5 - 8.1 g/dL   Albumin 3.5 3.5 - 5.0 g/dL   AST 19 15 - 41 U/L   ALT 15 0 - 44 U/L   Alkaline Phosphatase 118 38 - 126 U/L   Total Bilirubin 0.6 0.3 - 1.2 mg/dL   GFR, Estimated 38 (L) >60 mL/min    Comment: (NOTE) Calculated using the CKD-EPI Creatinine Equation (2021)    Anion gap 8 5 - 15    Comment: Performed at [facility], [address] [place], [place] [address]  C-reactive protein     Status: None   Collection Time: 01/05/23 10:51 AM  Result  Value Ref Range   CRP 0.6 <1.0 mg/dL    Comment: Performed at [facility], [address], [place], [place] [address]  Sedimentation rate     Status: Abnormal   Collection Time: 01/05/23 10:51 AM  Result Value Ref Range   Sed Rate 52 (H) 0 - 30 mm/hr    Comment: Performed at [facility], [address] [place], [place] [address]  Hemoglobin A1c     Status: Abnormal   Collection Time: 01/05/23 10:51 AM  Result Value Ref Range   Hgb A1c MFr Bld 6.1 (H) 4.8 - 5.6 %    Comment: (NOTE) Pre diabetes:          5.7%-6.4%  Diabetes:              >  6.4%  Glycemic control for   <7.0% adults with diabetes    Mean Plasma Glucose 128.37 mg/dL    Comment: Performed at [facility], [address] [place], [place] [address]  IgE     Status: None   Collection Time: 01/05/23 10:51 AM  Result Value Ref Range   IgE (Immunoglobulin E), Serum 154 6 - 495 IU/mL    Comment: (NOTE) Performed At: [facility] [address] [place], [place] [id number] Jolene Schimke MD [id number]   Glucose, capillary     Status: Abnormal   Collection Time: 01/18/23  6:15 AM  Result Value Ref Range   Glucose-Capillary 113 (H) 70 - 99 mg/dL    Comment: Glucose reference range applies only to samples taken after fasting for at least 8 hours.  ABO/Rh     Status: None   Collection Time: 01/18/23  6:40 AM  Result Value Ref Range   ABO/RH(D)      A NEG Performed at [facility], [address] [place], [place] [address]   Glucose, capillary     Status: Abnormal   Collection Time: 01/18/23 11:32 AM  Result Value Ref Range   Glucose-Capillary 166 (H) 70 - 99 mg/dL    Comment: Glucose reference range applies only to samples taken after fasting for at least 8 hours.  Comprehensive metabolic panel     Status: Abnormal   Collection Time: 01/27/23  6:27 PM  Result Value Ref Range   Sodium 134 (L) 135 - 145 mmol/L   Potassium 4.5 3.5 - 5.1 mmol/L   Chloride 100 98 - 111  mmol/L   CO2 25 22 - 32 mmol/L   Glucose, Bld 123 (H) 70 - 99 mg/dL    Comment: Glucose reference range applies only to samples taken after fasting for at least 8 hours.   BUN 15 8 - 23 mg/dL   Creatinine, Ser 7.84 0.44 - 1.00 mg/dL   Calcium 8.8 (L) 8.9 - 10.3 mg/dL   Total Protein 6.3 (L) 6.5 - 8.1 g/dL   Albumin 2.7 (L) 3.5 - 5.0 g/dL   AST 23 15 - 41 U/L   ALT 18 0 - 44 U/L   Alkaline Phosphatase 98 38 - 126 U/L   Total Bilirubin 0.7 0.3 - 1.2 mg/dL   GFR, Estimated 58 (L) >60 mL/min    Comment: (NOTE) Calculated using the CKD-EPI Creatinine Equation (2021)    Anion gap 9 5 - 15    Comment: Performed at [facility], [address] [place], [place] [address]  CBC     Status: Abnormal   Collection Time: 01/27/23  6:27 PM  Result Value Ref Range   WBC 8.3 4.0 - 10.5 K/uL   RBC 2.78 (L) 3.87 - 5.11 MIL/uL   Hemoglobin 8.8 (L) 12.0 - 15.0 g/dL   HCT 52.8 (L) 41.3 - 24.4 %   MCV 101.1 (H) 80.0 - 100.0 fL   MCH 31.7 26.0 - 34.0 pg   MCHC 31.3 30.0 - 36.0 g/dL   RDW 01.0 27.2 - 53.6 %   Platelets 311 150 - 400 K/uL   nRBC 0.0 0.0 - 0.2 %    Comment: Performed at [facility], [address] [place], [place] [address]  APTT     Status: Abnormal   Collection Time: 01/27/23  6:27 PM  Result Value Ref Range   aPTT 48 (H) 24 - 36 seconds    Comment:  IF BASELINE aPTT IS ELEVATED, SUGGEST PATIENT RISK ASSESSMENT BE USED TO DETERMINE APPROPRIATE ANTICOAGULANT THERAPY. Performed at [facility], [address] [place], [place] [address]   Lipase, blood     Status: None   Collection Time: 01/27/23  6:27 PM  Result Value Ref Range   Lipase 34 11 - 51 U/L    Comment: Performed at [facility], [address] [place], [place] [address]  Lipid panel     Status: Abnormal   Collection Time: 01/28/23  4:00 AM  Result Value Ref Range   Cholesterol 86 0 - 200 mg/dL   Triglycerides 55 <829 mg/dL   HDL 29 (L) >56 mg/dL   Total CHOL/HDL  Ratio 3.0 RATIO   VLDL 11 0 - 40 mg/dL   LDL Cholesterol 46 0 - 99 mg/dL    Comment:        Total Cholesterol/HDL:CHD Risk Coronary Heart Disease Risk Table                     Men   Women  1/2 Average Risk   3.4   3.3  Average Risk       5.0   4.4  2 X Average Risk   9.6   7.1  3 X Average Risk  23.4   11.0        Use the calculated Patient Ratio above and the CHD Risk Table to determine the patient's CHD Risk.        ATP III CLASSIFICATION (LDL):  <100     mg/dL   Optimal  213-086  mg/dL   Near or Above                    Optimal  130-159  mg/dL   Borderline  578-469  mg/dL   High  >629     mg/dL   Very High Performed at [facility], [address] [place], [place] [address]   Basic metabolic panel     Status: Abnormal   Collection Time: 01/28/23  4:00 AM  Result Value Ref Range   Sodium 136 135 - 145 mmol/L   Potassium 4.5 3.5 - 5.1 mmol/L   Chloride 104 98 - 111 mmol/L   CO2 27 22 - 32 mmol/L   Glucose, Bld 124 (H) 70 - 99 mg/dL    Comment: Glucose reference range applies only to samples taken after fasting for at least 8 hours.   BUN 14 8 - 23 mg/dL   Creatinine, Ser 3.24 (H) 0.44 - 1.00 mg/dL   Calcium 8.7 (L) 8.9 - 10.3 mg/dL   GFR, Estimated 55 (L) >60 mL/min    Comment: (NOTE) Calculated using the CKD-EPI Creatinine Equation (2021)    Anion gap 5 5 - 15    Comment: Performed at [facility], [address] [place], [place] [address]  CBC     Status: Abnormal   Collection Time: 01/28/23  4:00 AM  Result Value Ref Range   WBC 7.7 4.0 - 10.5 K/uL   RBC 2.67 (L) 3.87 - 5.11 MIL/uL   Hemoglobin 8.6 (L) 12.0 - 15.0 g/dL   HCT 72.5 (L) 36.6 - 44.0 %   MCV 101.1 (H) 80.0 - 100.0 fL   MCH 32.2 26.0 - 34.0 pg   MCHC 31.9 30.0 - 36.0 g/dL   RDW 34.7 42.5 - 95.6 %   Platelets 295 150 - 400 K/uL   nRBC 0.0 0.0 -  0.2 %    Comment: Performed at [facility], [address] [place], [place] [address]  Glucose, capillary     Status:  Abnormal   Collection Time: 01/28/23  5:51 AM  Result Value Ref Range   Glucose-Capillary 134 (H) 70 - 99 mg/dL    Comment: Glucose reference range applies only to samples taken after fasting for at least 8 hours.  Glucose, capillary     Status: Abnormal   Collection Time: 01/28/23  8:38 AM  Result Value Ref Range   Glucose-Capillary 108 (H) 70 - 99 mg/dL    Comment: Glucose reference range applies only to samples taken after fasting for at least 8 hours.  ECHOCARDIOGRAM COMPLETE BUBBLE STUDY     Status: None   Collection Time: 01/28/23  8:42 AM  Result Value Ref Range   Ao pk vel 1.97 m/s   AV Area VTI 1.66 cm2   AR max vel 1.49 cm2   AV Mean grad 8.3 mmHg   AV Peak grad 15.6 mmHg   S' Lateral 2.50 cm   AV Area mean vel 1.57 cm2   Area-P 1/2 4.26 cm2   Est EF 60 - 65%   Glucose, capillary     Status: None   Collection Time: 01/28/23 11:09 AM  Result Value Ref Range   Glucose-Capillary 97 70 - 99 mg/dL    Comment: Glucose reference range applies only to samples taken after fasting for at least 8 hours.  Glucose, capillary     Status: None   Collection Time: 01/28/23  4:10 PM  Result Value Ref Range   Glucose-Capillary 96 70 - 99 mg/dL    Comment: Glucose reference range applies only to samples taken after fasting for at least 8 hours.  Glucose, capillary     Status: Abnormal   Collection Time: 01/28/23  8:28 PM  Result Value Ref Range   Glucose-Capillary 122 (H) 70 - 99 mg/dL    Comment: Glucose reference range applies only to samples taken after fasting for at least 8 hours.  Glucose, capillary     Status: Abnormal   Collection Time: 01/29/23  8:33 AM  Result Value Ref Range   Glucose-Capillary 115 (H) 70 - 99 mg/dL    Comment: Glucose reference range applies only to samples taken after fasting for at least 8 hours.  Glucose, capillary     Status: Abnormal   Collection Time: 01/29/23 12:05 PM  Result Value Ref Range   Glucose-Capillary 168 (H) 70 - 99 mg/dL    Comment:  Glucose reference range applies only to samples taken after fasting for at least 8 hours.  Glucose, capillary     Status: Abnormal   Collection Time: 01/29/23  4:07 PM  Result Value Ref Range   Glucose-Capillary 140 (H) 70 - 99 mg/dL    Comment: Glucose reference range applies only to samples taken after fasting for at least 8 hours.  Glucose, capillary     Status: Abnormal   Collection Time: 01/30/23  8:21 AM  Result Value Ref Range   Glucose-Capillary 117 (H) 70 - 99 mg/dL    Comment: Glucose reference range applies only to samples taken after fasting for at least 8 hours.  Glucose, capillary     Status: Abnormal   Collection Time: 01/30/23 12:41 PM  Result Value Ref Range   Glucose-Capillary 133 (H) 70 - 99 mg/dL    Comment: Glucose reference range applies only to samples taken after fasting for at least  8 hours.  Glucose, capillary     Status: Abnormal   Collection Time: 01/30/23  4:20 PM  Result Value Ref Range   Glucose-Capillary 143 (H) 70 - 99 mg/dL    Comment: Glucose reference range applies only to samples taken after fasting for at least 8 hours.  Urinalysis, w/ Reflex to Culture (Infection Suspected) -     Status: Abnormal   Collection Time: 02/02/23  5:43 PM  Result Value Ref Range   Specimen Source URINE, CLEAN CATCH    Color, Urine STRAW (A) YELLOW   APPearance CLEAR CLEAR   Specific Gravity, Urine 1.005 1.005 - 1.030   pH 6.0 5.0 - 8.0   Glucose, UA NEGATIVE NEGATIVE mg/dL   Hgb urine dipstick NEGATIVE NEGATIVE   Bilirubin Urine NEGATIVE NEGATIVE   Ketones, ur NEGATIVE NEGATIVE mg/dL   Protein, ur NEGATIVE NEGATIVE mg/dL   Nitrite NEGATIVE NEGATIVE   Leukocytes,Ua LARGE (A) NEGATIVE   RBC / HPF 0-5 0 - 5 RBC/hpf   WBC, UA 21-50 0 - 5 WBC/hpf    Comment:        Reflex urine culture not performed if WBC <=10, OR if Squamous epithelial cells >5. If Squamous epithelial cells >5 suggest recollection.    Bacteria, UA RARE (A) NONE SEEN   Squamous Epithelial /  HPF 0-5 0 - 5 /HPF    Comment: Performed at [facility], [address] [place], [place] [address]  Urine Culture     Status: Abnormal   Collection Time: 02/02/23  5:43 PM   Specimen: Urine, Random  Result Value Ref Range   Specimen Description URINE, RANDOM    Special Requests NONE Reflexed from [id number]    Culture (A)     <10,000 COLONIES/mL INSIGNIFICANT GROWTH Performed at [facility], [address] [place], [place] [address]    Report Status 02/03/2023 FINAL   Comprehensive metabolic panel     Status: Abnormal   Collection Time: 02/03/23  6:12 AM  Result Value Ref Range   Sodium 134 (L) 135 - 145 mmol/L   Potassium 3.6 3.5 - 5.1 mmol/L   Chloride 99 98 - 111 mmol/L   CO2 28 22 - 32 mmol/L   Glucose, Bld 142 (H) 70 - 99 mg/dL    Comment: Glucose reference range applies only to samples taken after fasting for at least 8 hours.   BUN 22 8 - 23 mg/dL   Creatinine, Ser 1.47 (H) 0.44 - 1.00 mg/dL   Calcium 8.6 (L) 8.9 - 10.3 mg/dL   Total Protein 5.8 (L) 6.5 - 8.1 g/dL   Albumin 2.2 (L) 3.5 - 5.0 g/dL   AST 21 15 - 41 U/L   ALT 25 0 - 44 U/L   Alkaline Phosphatase 93 38 - 126 U/L   Total Bilirubin 0.6 0.3 - 1.2 mg/dL   GFR, Estimated 45 (L) >60 mL/min    Comment: (NOTE) Calculated using the CKD-EPI Creatinine Equation (2021)    Anion gap 7 5 - 15    Comment: Performed at [facility], [address] [place], [place] [address]  CBC with Differential/Platelet     Status: Abnormal   Collection Time: 02/03/23  6:12 AM  Result Value Ref Range   WBC 9.9 4.0 - 10.5 K/uL   RBC 2.63 (L) 3.87 - 5.11 MIL/uL   Hemoglobin 8.2 (L) 12.0 - 15.0 g/dL   HCT 21.3 (L) 08.6 - 57.8 %   MCV 96.2 80.0 -  100.0 fL   MCH 31.2 26.0 - 34.0 pg   MCHC 32.4 30.0 - 36.0 g/dL   RDW 84.6 96.2 - 95.2 %   Platelets 366 150 - 400 K/uL   nRBC 0.0 0.0 - 0.2 %   Neutrophils Relative % 80 %   Neutro Abs 7.9 (H) 1.7 - 7.7 K/uL   Lymphocytes Relative 12 %   Lymphs Abs 1.2 0.7 - 4.0 K/uL    Monocytes Relative 6 %   Monocytes Absolute 0.6 0.1 - 1.0 K/uL   Eosinophils Relative 1 %   Eosinophils Absolute 0.1 0.0 - 0.5 K/uL   Basophils Relative 0 %   Basophils Absolute 0.0 0.0 - 0.1 K/uL   Immature Granulocytes 1 %   Abs Immature Granulocytes 0.05 0.00 - 0.07 K/uL    Comment: Performed at [facility], [address], [place], [place] [address]  Glucose, capillary     Status: Abnormal   Collection Time: 02/08/23  4:52 PM  Result Value Ref Range   Glucose-Capillary 150 (H) 70 - 99 mg/dL    Comment: Glucose reference range applies only to samples taken after fasting for at least 8 hours.  Basic metabolic panel     Status: Abnormal   Collection Time: 02/09/23  7:25 AM  Result Value Ref Range   Sodium 137 135 - 145 mmol/L   Potassium 3.6 3.5 - 5.1 mmol/L   Chloride 103 98 - 111 mmol/L   CO2 27 22 - 32 mmol/L   Glucose, Bld 138 (H) 70 - 99 mg/dL    Comment: Glucose reference range applies only to samples taken after fasting for at least 8 hours.   BUN 19 8 - 23 mg/dL   Creatinine, Ser 4.40 (H) 0.44 - 1.00 mg/dL   Calcium 8.9 8.9 - 10.2 mg/dL   GFR, Estimated 49 (L) >60 mL/min    Comment: (NOTE) Calculated using the CKD-EPI Creatinine Equation (2021)    Anion gap 7 5 - 15    Comment: Performed at [facility], [address] [place], [place] [address]    Radiology DG Chest 2 View  Result Date: 02/02/2023 CLINICAL DATA:  Chest pain EXAM: CHEST - 2 VIEW COMPARISON:  02/18/2021 FINDINGS: The lungs are symmetrically well expanded. Small bilateral pleural effusions. Lungs are clear. No pneumothorax. Cardiac size is within normal limits. Pulmonary vascularity is normal. No acute bone abnormality. IMPRESSION: 1. Small bilateral pleural effusions. Electronically Signed   By: Helyn Numbers M.D.   On: 02/02/2023 22:05   MR BRAIN WO CONTRAST  Result Date: 01/29/2023 CLINICAL DATA:  Stroke, follow-up EXAM: MRI HEAD WITHOUT CONTRAST TECHNIQUE: Multiplanar, multiecho  pulse sequences of the brain and surrounding structures were obtained without intravenous contrast. COMPARISON:  01/27/2023 MRI head, correlation is made with 01/29/2023 CT head FINDINGS: Brain: Again noted are scattered foci of restricted diffusion with ADC correlates in the left frontal, parietal, temporal, and occipital cortex and white matter and left caudate; several of these have increased in conspicuity since the prior exam in the vein consistent with acute infarcts. Likely new small acute focus of restricted diffusion with ADC correlate in the left corona radiata (series 9, image 27). No acute hemorrhage, mass, mass effect, or midline shift. No hydrocephalus or extra-axial collection. Confluent T2 hyperintense signal in the periventricular white matter and pons, likely the sequela of moderate to severe chronic small vessel ischemic disease. Dilated perivascular spaces in the bilateral basal ganglia. Vascular: No seen on the prior  CTA, poor visualization of the right V4 flow void. Otherwise normal arterial flow voids. Skull and upper cervical spine: Normal marrow signal. Sinuses/Orbits: Clear paranasal sinuses. No acute finding in the orbits. Status post bilateral lens replacements. Other: The mastoid air cells are well aerated. 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 Otherwise unchanged scattered acute to early subacute infarcts throughout the left cerebral hemisphere. No acute hemorrhage, mass effect, or midline shift. These results will be called to the ordering clinician or representative by the Radiologist Assistant, and communication documented in the PACS or [employer]. Electronically Signed   By: Wiliam Ke M.D.   On: 01/29/2023 23:58   CT ANGIO HEAD NECK W WO CM (CODE STROKE)  Result Date: 01/29/2023 CLINICAL DATA:  Neuro deficit, stroke suspected. Abnormal MRI with left-sided infarcts. EXAM: CT ANGIOGRAPHY HEAD AND NECK WITH AND WITHOUT CONTRAST TECHNIQUE:  Multidetector CT imaging of the head and neck was performed using the standard protocol during bolus administration of intravenous contrast. Multiplanar CT image reconstructions and MIPs were obtained to evaluate the vascular anatomy. Carotid stenosis measurements (when applicable) are obtained utilizing NASCET criteria, using the distal internal carotid diameter as the denominator. RADIATION DOSE REDUCTION: This exam was performed according to the departmental dose-optimization program which includes automated exposure control, adjustment of the mA and/or kV according to patient size and/or use of iterative reconstruction technique. CONTRAST:  75mL OMNIPAQUE IOHEXOL 350 MG/ML SOLN COMPARISON:  MR head without contrast 01/27/2023. CT head without contrast 01/28/2023 and CT head without contrast 01/29/2023. FINDINGS: CTA NECK FINDINGS Aortic arch: Atherosclerotic calcifications are again noted at the aortic arch and great vessel origins. No stenosis or aneurysm is present. Right carotid system: The right common carotid artery is within normal limits. Mural calcifications are stable. The high-grade stenosis of greater than 80% is present at the proximal right ICA. Previous study underestimated the degree of stenosis. No significant interval change is present. Left carotid system: The left common carotid artery demonstrates extensive mural calcification without proximal stenosis. A high-grade, near occlusive stenosis is present in the proximal left ICA. More distal left ICA is slightly smaller in caliber than the right. Vertebral arteries: The vertebral arteries both originate from the subclavian arteries. The left vertebral artery is dominant. A 50% stenosis present at its origin. The right vertebral artery is hypoplastic. It is occluded in the V3 segment is proximal to the dura. No significant stenosis is present on the left. Skeleton: Multilevel degenerative changes are again noted within the cervical spine. No  focal osseous lesions are present. Other neck: Soft tissues the neck are otherwise unremarkable. Salivary glands are within normal limits. Thyroid is normal. No significant adenopathy is present. No focal mucosal or submucosal lesions are present. Upper chest: Small bilateral pleural effusions are present. Mild dependent atelectasis is present. No nodule or mass lesion is present. No significant airspace disease is present otherwise. Review of the MIP images confirms the above findings CTA HEAD FINDINGS Anterior circulation: Atherosclerotic calcifications are again noted within the cavernous internal carotid arteries bilaterally. No significant stenosis or interval change is present. The A1 and M1 segments are normal bilaterally. The anterior communicating artery is patent. MCA bifurcations within normal limits bilaterally. The ACA and MCA branch vessels are within normal limits. No aneurysm is present. Posterior circulation: Left vertebral artery is scratched at atherosclerotic changes are present at the dural margin of the left vertebral artery without a significant stenosis. Left PICA origin is visualized and normal. The right  V4 segment fills to the PICA, likely in retrograde fashion. The basilar artery is normal. Both superior cerebellar arteries are patent. The left posterior cerebral artery originates from basilar tip. The right posterior cerebral artery is of fetal type with a small right P1 segment. A proximal right P2 segment stenosis is again noted. In mid left P2 segment stenosis is present. PCA branch vessels are otherwise unremarkable and stable. Venous sinuses: The dural sinuses are patent. The straight sinus and deep cerebral veins are intact. Cortical veins are within normal limits. No significant vascular malformation is evident. Anatomic variants: Fetal type right posterior cerebral artery. Review of the MIP images confirms the above findings IMPRESSION: 1. 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 2. 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 3. Stable 50% stenosis at the origin of the dominant left vertebral artery. 4. 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 5. Stable proximal right P2 segment stenosis. 6. Stable mid left P2 segment stenosis. 7. Stable atherosclerotic changes within the cavernous internal carotid arteries bilaterally without significant stenosis. 8. No significant proximal stenosis, aneurysm, or branch vessel occlusion within the Circle of Willis. 9.  Aortic Atherosclerosis (ICD10-I70.0). These results were called by telephone at the time of interpretation on 01/29/2023 at 4:31 Pm to provider [facility] , who verbally acknowledged these results. The final report was delayed by technical issues. Electronically Signed   By: Marin Roberts M.D.   On: 01/29/2023 17:50   CT HEAD CODE STROKE WO CONTRAST  Result Date: 01/29/2023 CLINICAL DATA:  Code stroke.  Neuro deficit, acute, stroke suspected EXAM: CT HEAD WITHOUT CONTRAST TECHNIQUE: Contiguous axial images were obtained from the base of the skull through the vertex without intravenous contrast. RADIATION DOSE REDUCTION: This exam was performed according to the departmental dose-optimization program which includes automated exposure control, adjustment of the mA and/or kV according to patient size and/or use of iterative reconstruction technique. COMPARISON:  None Available. FINDINGS: Brain: No evidence of acute infarction, hemorrhage, hydrocephalus, extra-axial collection or mass lesion/mass effect. Patchy white matter hypodensities, nonspecific but compatible with chronic microvascular ischemic disease. Vascular: No hyperdense vessel. Skull: No acute fracture. Sinuses/Orbits: Clear sinuses.  No acute orbital findings. Other: No mastoid effusions. ASPECTS [facility] Stroke Program Early CT Score) total score (0-10  with 10 being normal): 10. IMPRESSION: 1. No evidence of acute intracranial abnormality. 2. ASPECTS is 10. Code stroke imaging results were communicated on 01/29/2023 at 3:58 pm to provider Dr. Amada Jupiter via secure text paging. Electronically Signed   By: Feliberto Harts M.D.   On: 01/29/2023 15:58   ECHOCARDIOGRAM COMPLETE BUBBLE STUDY  Result Date: 01/28/2023    ECHOCARDIOGRAM REPORT   Patient Name:   CAYLANI WEATHERWAX [facility] Date of Exam: 01/28/2023 Medical Rec #:  [id number]        Height:       62.0 in Accession #:    [id number]       Weight:       193.1 lb Date of Birth:  1941/07/15        BSA:          1.883 m Patient Age:    81 years         BP:           112/61 mmHg Patient Gender: F                HR:  65 bpm. Exam Location:  [facility] Procedure: 2D Echo, Cardiac Doppler, Color Doppler and Saline Contrast Bubble            Study Indications:     TIA 435.9 / G45.9  History:         Patient has prior history of Echocardiogram examinations, most                  recent 01/14/2021. Arrythmias:Atrial Fibrillation; Risk                  Factors:Hypertension.  Sonographer:     Cristela Blue Referring Phys:  [id number] JAN A MANSY Diagnosing Phys: Julien Nordmann MD  Sonographer Comments: Suboptimal apical window. IMPRESSIONS  1. Left ventricular ejection fraction, by estimation, is 60 to 65%. The left ventricle has normal function. The left ventricle has no regional wall motion abnormalities. Left ventricular diastolic parameters are indeterminate.  2. Right ventricular systolic function is normal. The right ventricular size is normal. There is normal pulmonary artery systolic pressure. The estimated right ventricular systolic pressure is 26.3 mmHg.  3. Left atrial size was moderately dilated.  4. Right atrial size was mildly dilated.  5. The mitral valve is normal in structure. Mild mitral valve regurgitation. No evidence of mitral stenosis.  6. Tricuspid valve regurgitation is mild to moderate.  7. The aortic valve is  normal in structure. Aortic valve regurgitation is not visualized. Aortic valve sclerosis is present, with no evidence of aortic valve stenosis.  8. The inferior vena cava is normal in size with greater than 50% respiratory variability, suggesting right atrial pressure of 3 mmHg.  9. Agitated saline contrast bubble study was negative, with no evidence of any interatrial shunt. FINDINGS  Left Ventricle: Left ventricular ejection fraction, by estimation, is 60 to 65%. The left ventricle has normal function. The left ventricle has no regional wall motion abnormalities. The left ventricular internal cavity size was normal in size. There is  no left ventricular hypertrophy. Left ventricular diastolic parameters are indeterminate. Right Ventricle: The right ventricular size is normal. No increase in right ventricular wall thickness. Right ventricular systolic function is normal. There is normal pulmonary artery systolic pressure. The tricuspid regurgitant velocity is 2.31 m/s, and  with an assumed right atrial pressure of 5 mmHg, the estimated right ventricular systolic pressure is 26.3 mmHg. Left Atrium: Left atrial size was moderately dilated. Right Atrium: Right atrial size was mildly dilated. Pericardium: There is no evidence of pericardial effusion. Mitral Valve: The mitral valve is normal in structure. There is mild calcification of the mitral valve leaflet(s). Mild mitral annular calcification. Mild mitral valve regurgitation. No evidence of mitral valve stenosis. Tricuspid Valve: The tricuspid valve is normal in structure. Tricuspid valve regurgitation is mild to moderate. No evidence of tricuspid stenosis. Aortic Valve: The aortic valve is normal in structure. Aortic valve regurgitation is not visualized. Aortic valve sclerosis is present, with no evidence of aortic valve stenosis. Aortic valve mean gradient measures 8.3 mmHg. Aortic valve peak gradient measures 15.6 mmHg. Aortic valve area, by VTI measures 1.66  cm. Pulmonic Valve: The pulmonic valve was normal in structure. Pulmonic valve regurgitation is not visualized. No evidence of pulmonic stenosis. Aorta: The aortic root is normal in size and structure. Venous: The inferior vena cava is normal in size with greater than 50% respiratory variability, suggesting right atrial pressure of 3 mmHg. IAS/Shunts: No atrial level shunt detected by color flow Doppler. Agitated saline contrast was given intravenously to evaluate  for intracardiac shunting. Agitated saline contrast bubble study was negative, with no evidence of any interatrial shunt. There  is no evidence of a patent foramen ovale. There is no evidence of an atrial septal defect.  LEFT VENTRICLE PLAX 2D LVIDd:         3.90 cm   Diastology LVIDs:         2.50 cm   LV e' medial:    8.16 cm/s LV PW:         0.80 cm   LV E/e' medial:  13.7 LV IVS:        1.40 cm   LV e' lateral:   11.10 cm/s LVOT diam:     2.00 cm   LV E/e' lateral: 10.1 LV SV:         67 LV SV Index:   36 LVOT Area:     3.14 cm  RIGHT VENTRICLE RV Basal diam:  3.70 cm RV Mid diam:    3.20 cm RV S prime:     13.10 cm/s TAPSE (M-mode): 1.3 cm LEFT ATRIUM             Index        RIGHT ATRIUM           Index LA diam:        4.10 cm 2.18 cm/m   RA Area:     22.60 cm LA Vol (A2C):   68.3 ml 36.26 ml/m  RA Volume:   74.90 ml  39.77 ml/m LA Vol (A4C):   82.3 ml 43.70 ml/m LA Biplane Vol: 80.7 ml 42.85 ml/m  AORTIC VALVE AV Area (Vmax):    1.49 cm AV Area (Vmean):   1.57 cm AV Area (VTI):     1.66 cm AV Vmax:           197.33 cm/s AV Vmean:          133.667 cm/s AV VTI:            0.406 m AV Peak Grad:      15.6 mmHg AV Mean Grad:      8.3 mmHg LVOT Vmax:         93.40 cm/s LVOT Vmean:        67.000 cm/s LVOT VTI:          0.214 m LVOT/AV VTI ratio: 0.53  AORTA Ao Root diam: 3.30 cm MITRAL VALVE                TRICUSPID VALVE MV Area (PHT): 4.26 cm     TR Peak grad:   21.3 mmHg MV Decel Time: 178 msec     TR Vmax:        231.00 cm/s MV E velocity:  112.00 cm/s                             SHUNTS                             Systemic VTI:  0.21 m                             Systemic Diam: 2.00 cm Julien Nordmann MD Electronically signed by Julien Nordmann MD Signature Date/Time: 01/28/2023/12:52:11 PM    Final    CT HEAD CODE STROKE WO CONTRAST`  Addendum Date: 01/28/2023  ADDENDUM REPORT: 01/28/2023 06:48 ADDENDUM: Study discussed by telephone with Dr. Valente David on 01/28/2023 at 0642 hours. Electronically Signed   By: Odessa Fleming M.D.   On: 01/28/2023 06:48   Result Date: 01/28/2023 CLINICAL DATA:  Code stroke. 82 year old female with weakness and altered mental status, change in stroke symptoms. Numerous scattered small foci of ischemia in the left hemisphere on MRI yesterday. EXAM: CT HEAD WITHOUT CONTRAST TECHNIQUE: Contiguous axial images were obtained from the base of the skull through the vertex without intravenous contrast. RADIATION DOSE REDUCTION: This exam was performed according to the departmental dose-optimization program which includes automated exposure control, adjustment of the mA and/or kV according to patient size and/or use of iterative reconstruction technique. COMPARISON:  Brain MRI 2317 hours yesterday. Head CT 1859 hours yesterday. FINDINGS: Brain: No acute intracranial hemorrhage identified. No midline shift, mass effect, or evidence of intracranial mass lesion. Stable ventricle size and configuration. Patchy bilateral white matter hypodensity is stable. The multiple small foci of abnormal diffusion scattered in the left hemisphere on MRI last night are largely occult by CT. Stable gray-white matter differentiation throughout the brain. No new cortically based infarct identified. Vascular: No suspicious intracranial vascular hyperdensity. Calcified atherosclerosis at the skull base. Skull: No acute osseous abnormality identified. Mild motion artifact. Sinuses/Orbits: Visualized paranasal sinuses and mastoids are stable and well aerated.  Other: No gaze deviation, acute orbit or scalp soft tissue finding. ASPECTS [facility] Stroke Program Early CT Score) Total score (0-10 with 10 being normal): 10 IMPRESSION: Stable noncontrast CT appearance of the brain since yesterday, the multiple small infarcts by MRI are largely occult by CT. No new intracranial abnormality. Electronically Signed: By: Odessa Fleming M.D. On: 01/28/2023 06:37   MR BRAIN WO CONTRAST  Result Date: 01/28/2023 CLINICAL DATA: Confusion, right upper extremity weakness EXAM: MRI HEAD WITHOUT CONTRAST TECHNIQUE: Multiplanar, multiecho pulse sequences of the brain and surrounding structures were obtained without intravenous contrast. COMPARISON:  No prior MRI available, correlation is made with 01/27/2023 CT head and CTA head and neck FINDINGS: Evaluation is significantly limited by motion, with the exception of the diffusion-weighted sequence. Brain: Scattered foci of cortical and white matter restricted diffusion with ADC correlates in the left cerebral hemisphere and left caudate (series 5, images 20-38). Evaluation for T2 hyperintense correlate is limited by motion artifact No definite acute hemorrhage, mass, mass effect, or midline shift. No hydrocephalus or extra-axial collection. Vascular: As seen on the prior CTA, the right V4 flow void is not well seen. Otherwise normal arterial flow voids. Skull and upper cervical spine: Grossly normal marrow signal. Sinuses/Orbits: Clear paranasal sinuses. Status post bilateral lens replacements. Other: The mastoid air cells are well aerated. IMPRESSION: Evaluation is significantly limited by motion, with the exception of the diffusion-weighted sequence. 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 These results were called by telephone at the time of interpretation on 01/28/2023 at 1:05 am to provider [facility] , who verbally acknowledged these results. Electronically Signed    By: Wiliam Ke M.D.   On: 01/28/2023 01:05   CT ANGIO HEAD NECK W WO CM  Result Date: 01/27/2023 CLINICAL DATA:  Confusion and right upper extremity weakness EXAM: CT ANGIOGRAPHY HEAD AND NECK WITH AND WITHOUT CONTRAST TECHNIQUE: Multidetector CT imaging of the head and neck was performed using the standard protocol during bolus administration of intravenous contrast. Multiplanar CT image reconstructions and MIPs were obtained to evaluate the vascular  anatomy. Carotid stenosis measurements (when applicable) are obtained utilizing NASCET criteria, using the distal internal carotid diameter as the denominator. RADIATION DOSE REDUCTION: This exam was performed according to the departmental dose-optimization program which includes automated exposure control, adjustment of the mA and/or kV according to patient size and/or use of iterative reconstruction technique. CONTRAST:  75mL OMNIPAQUE IOHEXOL 350 MG/ML SOLN COMPARISON:  No prior CTA available, correlation is made with CT head 01/27/2023 FINDINGS: CT HEAD FINDINGS For noncontrast findings, please see same day CT head. CTA NECK FINDINGS Aortic arch: Evaluation of the arch is somewhat limited by motion. Within this limitation, Two-vessel arch with a common origin of the brachiocephalic and left common carotid arteries. Imaged portion shows no evidence of aneurysm or dissection. No significant stenosis of the major arch vessel origins. Mild aortic atherosclerosis. Right carotid system: 60% stenosis in the proximal right ICA (series 6, image 194). Additional atherosclerotic in the right common carotid artery is not hemodynamically significant. Left carotid system: 70% stenosis in the proximal left ICA (series 6, image 199). Additional atherosclerotic disease in the common carotid artery is not hemodynamically significant. Vertebral arteries: Left dominant system. The left vertebral artery is patent from its origin to the skull base without significant stenosis or  evidence of dissection. The right vertebral artery is quite diminutive, and poorly perfused in the distal V3 segment (series 6, image 159), likely reflecting moderate stenosis. Skeleton: No acute osseous abnormality. Degenerative changes in the cervical spine. Other neck: Hypoenhancing and calcified nodules in the thyroid, the largest of which measures up to 6 mm, for which no follow-up is currently indicated. (Reference: J Am Coll Radiol. 2015 Feb;12(2): 143-50) Upper chest: No focal pulmonary opacity or pleural effusion. Mild centrilobular emphysema. Review of the MIP images confirms the above findings CTA HEAD FINDINGS Anterior circulation: Both internal carotid arteries are patent to the termini, with mild stenosis in the bilateral cavernous and supraclinoid segments. A1 segments patent. Normal anterior communicating artery. Anterior cerebral arteries are patent to their distal aspects without significant stenosis. No M1 stenosis or occlusion. MCA branches are irregular but perfused to their distal aspects without significant stenosis. Posterior circulation: Nonopacification of the proximal right V4 (series 6, image 146), consistent with occlusion. The more distal right V4 is opacified, likely retrograde. The left vertebral artery is patent to the vertebrobasilar junction with mild stenosis in the proximal V4 (series 6, image 141). Posteroinferior cerebellar arteries are not definitively visualized. Basilar patent to its distal aspect without significant stenosis. Superior cerebellar arteries patent proximally. Patent P1 segments. Mild stenosis in the proximal right P2 (series 6, image 99), and mid left P 2 (series 6, image 105). The remainder of the PCAs are irregular but do not demonstrate significant stenosis. The right posterior communicating artery is patent. The left is not definitively seen. Venous sinuses: As permitted by contrast timing, patent. Anatomic variants: None significant. Review of the MIP  images confirms the above findings IMPRESSION: 1. 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 This is of indeterminate acuity. 2. 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 3. Mild stenosis in the bilateral cavernous and supraclinoid ICAs. 4. Mild stenosis in the proximal right P2 and mid left P2. 5. Aortic atherosclerosis. 6. Emphysema. Aortic Atherosclerosis (ICD10-I70.0) and Emphysema (ICD10-J43.9). These results were called by telephone at the time of interpretation on 01/27/2023 at 10:07 pm to provider [facility] , who verbally acknowledged  these results. Electronically Signed   By: Wiliam Ke M.D.   On: 01/27/2023 22:07   CT Head Wo Contrast  Result Date: 01/27/2023 CLINICAL DATA:  Confusion. EXAM: CT HEAD WITHOUT CONTRAST TECHNIQUE: Contiguous axial images were obtained from the base of the skull through the vertex without intravenous contrast. RADIATION DOSE REDUCTION: This exam was performed according to the departmental dose-optimization program which includes automated exposure control, adjustment of the mA and/or kV according to patient size and/or use of iterative reconstruction technique. COMPARISON:  Head CT dated 10/03/2021. FINDINGS: Brain: Mild age-related atrophy and moderate chronic microvascular ischemic changes. There is no acute intracranial hemorrhage. No mass effect or midline shift. No extra-axial fluid collection. Vascular: No hyperdense vessel or unexpected calcification. Skull: Normal. Negative for fracture or focal lesion. Sinuses/Orbits: No acute finding. Other: None IMPRESSION: 1. No acute intracranial pathology. 2. Mild age-related atrophy and moderate chronic microvascular ischemic changes. Electronically Signed   By: Elgie Collard M.D.   On: 01/27/2023 19:14   DG HIP UNILAT W OR W/O PELVIS 2-3 VIEWS RIGHT  Result Date: 01/18/2023 CLINICAL DATA:  Status post right hip  arthroplasty EXAM: DG HIP (WITH OR WITHOUT PELVIS) 2-3V RIGHT COMPARISON:  None Available. FINDINGS: There is recent right hip arthroplasty. No fracture is seen. Surgical drain is noted in place. There are pockets of air and skin staples from recent surgery. IMPRESSION: Status post right hip arthroplasty. Electronically Signed   By: Ernie Avena M.D.   On: 01/18/2023 12:46    Assessment/Plan  Bilateral carotid artery stenosis The patient has a significant left-sided carotid stenosis of 70% or greater with a stroke earlier this month.  This would be a clear indication for repair.  We discussed the options of repair which would include carotid endarterectomy, carotid stenting, or TCAR.  We discussed the risks and benefits of the procedure.  The patient seems to have anatomy that would be amenable to carotid stenting and this was what we chose today.  We will go ahead and get her started back on Plavix 75 mg daily and I would like her to be on that for at least 5 days before the procedure.  We discussed that she will need to be on this for at least 90 days after the procedure.  We discussed the expected 1 night hospital stay in the ICU.  We discussed the risks and benefits of all the procedures.  We also discussed the risks and benefits of medical management.  Patient and her daughter voiced their understanding and desire to proceed.  Primary hypertension blood pressure control important in reducing the progression of atherosclerotic disease. On appropriate oral medications.   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 Also, involved in wound healing. On appropriate medications.   Dyslipidemia lipid control important in reducing the progression of atherosclerotic disease. Continue statin  therapy    Festus Barren, MD  02/16/2023 1:38 PM    This note was created with Dragon medical transcription system.  Any errors from dictation are purely unintentional

## 2023-02-16 NOTE — Assessment & Plan Note (Signed)
lipid control important in reducing the progression of atherosclerotic disease. Continue statin therapy  

## 2023-02-16 NOTE — Progress Notes (Signed)
MRN : [id number]  Brooke Davis is a 82 y.o. (10/09/1940) female who presents with chief complaint of  Chief Complaint  Patient presents with   Follow-up    Evaluate bilateral carotid stenosis   .  History of Present Illness: Patient returns today in follow up of her carotid disease.  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  As part of her workup, she underwent a CT angiogram of the neck which I have independently reviewed.  This is interpreted as a 60% right carotid stenosis and a 70% left carotid stenosis.  I think this is reasonable although I think the degree of stenosis is slightly worse on the left side.  Nonetheless, with a clearly symptomatic greater than 60% lesion, we discussed intervention for her left carotid stenosis while she was in the hospital and a follow-up of 1 to 2 weeks to allow brain healing and less risk of reperfusion injury.  She has done well in that time.  No recurrent symptoms.  No new complaints.  Current Outpatient Medications  Medication Sig Dispense Refill   acetaminophen (TYLENOL) 325 MG tablet Take 2 tablets (650 mg total) by mouth every 6 (six) hours as needed for mild pain (or Fever >/= 101).     allopurinol (ZYLOPRIM) 300 MG tablet Take 1 tablet (300 mg total) by mouth daily. 30 tablet 0   apixaban (ELIQUIS) 5 MG TABS tablet Take 1 tablet (5 mg total) by mouth 2 (two) times daily. 60 tablet 0   aspirin EC 81 MG tablet Take 1 tablet (81 mg total) by mouth daily. Swallow whole.     atorvastatin (LIPITOR) 80 MG tablet Take 1 tablet (80 mg total) by mouth daily. 30 tablet 0   citalopram (CELEXA) 20 MG tablet Take 1 tablet (20 mg total) by mouth daily. 30 tablet 0   clonazePAM (KLONOPIN) 0.5 MG tablet Take 1 tablet (0.5 mg total) by mouth 3 (three) times daily as needed (anxiety). 10 tablet 0   clopidogrel (PLAVIX) 75 MG tablet Take 1 tablet (75 mg total) by  mouth daily. 30 tablet 6   melatonin 5 MG TABS Take 1 tablet (5 mg total) by mouth at bedtime. 30 tablet 0   metoprolol tartrate (LOPRESSOR) 25 MG tablet Take 1 tablet (25 mg total) by mouth 2 (two) times daily. 60 tablet 0   oxyCODONE (OXY IR/ROXICODONE) 5 MG immediate release tablet Take 1 tablet (5 mg total) by mouth every 4 (four) hours as needed for moderate pain (pain score 4-6). 30 tablet 0   potassium chloride (KLOR-CON) 10 MEQ tablet Take 1 tablet (10 mEq total) by mouth daily. 30 tablet 0   sacubitril-valsartan (ENTRESTO) 24-26 MG Take 1 tablet by mouth 2 (two) times daily. 60 tablet 0   torsemide (DEMADEX) 20 MG tablet Take 1 tablet (20 mg total) by mouth daily. 30 tablet 0   No current facility-administered medications for this visit.    Past Medical History:  Diagnosis Date   Anxiety    a.) on BZO (alprazolam) PRN   Arthritis    fingers, right hip   Atrial fibrillation (HCC) 11/10/2013   a.) CHA2DS2VASc = 5 (age x2, sex, HTN, T2DM);  b.) rate/rhythm maintained on oral metoprolol tartrate; chronically anticoagulated with apixaban   Bronchitis    Cerebrovascular small vessel disease (chronic)    Coronary artery disease involving native coronary artery of  native heart 01/11/2021   a.) LHC 01/11/2021: 40% oLM, 50% mLCx, 30% oLCx, 30% pLAD, 30% mLAD, 99% pRCA (2.75 x 30 mm Resolute Onyx DES), 75% p-mRCA, 60% dRCA   DDD (degenerative disc disease), lumbar 12/01/2013   Diastolic dysfunction 01/12/2021   a.) TTE 01/12/2021 (s/p inf STEMI): EF 55-60%, mild-mod LAE, mild RAE, mod RVE, mild AR/MR, mod TR, G1DD   Diet-controlled type 2 diabetes mellitus (HCC)    Full dentures    Gout    History of bilateral cataract extraction 2021   Hyperlipidemia    Hypertension, essential    Long term current use of anticoagulant    a.) apixaban   PONV (postoperative nausea and vomiting)    Sinoatrial node dysfunction (HCC) 11/10/2013   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  All OK.   CATARACT EXTRACTION W/PHACO Left 03/27/2020   Procedure: CATARACT EXTRACTION PHACO AND INTRAOCULAR LENS PLACEMENT (IOC) LEFT 7.49  00:56.4  13.3%;  Surgeon: Lockie Mola, MD;  Location: [facility];  Service: Ophthalmology;  Laterality: Left;   CATARACT EXTRACTION W/PHACO Right 04/24/2020   Procedure: CATARACT EXTRACTION PHACO AND INTRAOCULAR LENS PLACEMENT (IOC) RIGHT;  Surgeon: Lockie Mola, MD;  Location: [facility];  Service: Ophthalmology;  Laterality: Right;  8.34 0:50.2 16.6%   CORONARY/GRAFT ACUTE MI REVASCULARIZATION N/A 01/11/2021   Procedure: Coronary/Graft Acute MI Revascularization;  Surgeon: Marcina Millard, MD;  Location: [facility];  Service: Cardiovascular;  Laterality: N/A;   LEFT HEART CATH AND CORONARY ANGIOGRAPHY N/A 01/11/2021   Procedure: LEFT HEART CATH AND CORONARY ANGIOGRAPHY;  Surgeon: Marcina Millard, MD;  Location: [facility];  Service: Cardiovascular;  Laterality: N/A;   PARTIAL HYSTERECTOMY     TOE SURGERY Left    great toe; rod in place   TOTAL HIP ARTHROPLASTY Right 01/18/2023   Procedure: TOTAL HIP ARTHROPLASTY;  Surgeon: Donato Heinz, MD;  Location: [facility];  Service: Orthopedics;  Laterality: Right;   TOTAL KNEE ARTHROPLASTY Right 2007     Social History   Tobacco Use   Smoking status: Former    Current packs/day: 0.00    Types: Cigarettes    Quit date: 1980    Years since quitting: 44.6   Smokeless tobacco: Never  Vaping Use   Vaping status: Never Used  Substance Use Topics   Alcohol use: Not Currently   Drug use: Never       Family History  Problem  Relation Age of Onset   Heart attack Mother 27       required open heart surgery. Onset age is unknown.   Stroke Father    Diabetes Father    Alcoholism Brother    Breast cancer Neg Hx      Allergies  Allergen Reactions   Celebrex [Celecoxib] Diarrhea   Penicillins Itching    IgE = 154 (WNL) on 01/05/2023   Relafen [Nabumetone]     Palpitation    Lovastatin Diarrhea     REVIEW OF SYSTEMS (Negative unless checked)  Constitutional: [] Weight loss  [] Fever  [] Chills Cardiac: [] Chest pain   []   Chest pressure   [x] Palpitations   [] Shortness of breath when laying flat   [] Shortness of breath at rest   [x] Shortness of breath with exertion. Vascular:  [] Pain in legs with walking   [] Pain in legs at rest   [] Pain in legs when laying flat   [] Claudication   [] Pain in feet when walking  [] Pain in feet at rest  [] Pain in feet when laying flat   [] History of DVT   [] Phlebitis   [] Swelling in legs   [] Varicose veins   [] Non-healing ulcers Pulmonary:   [] Uses home oxygen   [] Productive cough   [] Hemoptysis   [] Wheeze  [] COPD   [] Asthma Neurologic:  [] Dizziness  [] Blackouts   [] Seizures   [x] History of stroke   [] History of TIA  [] Aphasia   [] Temporary blindness   [] Dysphagia   [] Weakness or numbness in arms   [] Weakness or numbness in legs Musculoskeletal:  [x] Arthritis   [] Joint swelling   [x] Joint pain   [] Low back pain Hematologic:  [] Easy bruising  [] Easy bleeding   [] Hypercoagulable state   [] Anemic   Gastrointestinal:  [] Blood in stool   [] Vomiting blood  [x] Gastroesophageal reflux/heartburn   [] Abdominal pain Genitourinary:  [] Chronic kidney disease   [] Difficult urination  [] Frequent urination  [] Burning with urination   [] Hematuria Skin:  [] Rashes   [] Ulcers   [] Wounds Psychological:  [x] History of anxiety   []  History of major depression.  Physical Examination  BP 139/68 (BP Location: Right Arm)   Pulse 76   Resp 16  Gen:  WD/WN, NAD Head: [place]/AT, No temporalis  wasting. Ear/Nose/Throat: Hearing grossly intact, nares w/o erythema or drainage Eyes: Conjunctiva clear. Sclera non-icteric Neck: Supple.  Trachea midline Pulmonary:  Good air movement, no use of accessory muscles.  Cardiac: somewhat irregular Vascular:  Vessel Right Left  Radial Palpable Palpable               Musculoskeletal: M/S 5/5 throughout.  No deformity or atrophy. Trace LE edema. Neurologic: Sensation grossly intact in extremities.  Symmetrical.  Speech is fluent.  Psychiatric: Judgment intact, Mood & affect appropriate for pt's clinical situation. Dermatologic: No rashes or ulcers noted.  No cellulitis or open wounds.      Labs Recent Results (from the past 2160 hour(s))  Urinalysis, Routine w reflex microscopic -Urine, Clean Catch     Status: Abnormal   Collection Time: 01/05/23 10:49 AM  Result Value Ref Range   Color, Urine YELLOW (A) YELLOW   APPearance HAZY (A) CLEAR   Specific Gravity, Urine 1.013 1.005 - 1.030   pH 7.0 5.0 - 8.0   Glucose, UA NEGATIVE NEGATIVE mg/dL   Hgb urine dipstick NEGATIVE NEGATIVE   Bilirubin Urine NEGATIVE NEGATIVE   Ketones, ur NEGATIVE NEGATIVE mg/dL   Protein, ur NEGATIVE NEGATIVE mg/dL   Nitrite NEGATIVE NEGATIVE   Leukocytes,Ua TRACE (A) NEGATIVE   RBC / HPF 0-5 0 - 5 RBC/hpf   WBC, UA 0-5 0 - 5 WBC/hpf   Bacteria, UA RARE (A) NONE SEEN   Squamous Epithelial / HPF 6-10 0 - 5 /HPF    Comment: Performed at [facility], [address] [place], [place] [address]  Type and screen [facility]     Status: None   Collection Time: 01/05/23 10:49 AM  Result Value Ref Range   ABO/RH(D) A NEG    Antibody Screen NEG    Sample Expiration 01/19/2023,2359    Extend sample reason      NO TRANSFUSIONS  OR PREGNANCY IN THE PAST 3 MONTHS Performed at [facility], [address] [place], [place] [address]   Surgical pcr screen     Status: Abnormal   Collection Time: 01/05/23 10:49 AM    Specimen: Nasal Mucosa; Nasal Swab  Result Value Ref Range   MRSA, PCR NEGATIVE NEGATIVE   Staphylococcus aureus POSITIVE (A) NEGATIVE    Comment: (NOTE) The Xpert SA Assay (FDA approved for NASAL specimens in patients 68 years of age and older), is one component of a comprehensive surveillance program. It is not intended to diagnose infection nor to guide or monitor treatment. Performed at [facility], [address] [place], [place] [address]   CBC     Status: Abnormal   Collection Time: 01/05/23 10:51 AM  Result Value Ref Range   WBC 6.8 4.0 - 10.5 K/uL   RBC 3.02 (L) 3.87 - 5.11 MIL/uL   Hemoglobin 9.9 (L) 12.0 - 15.0 g/dL   HCT 55.7 (L) 32.2 - 02.5 %   MCV 102.6 (H) 80.0 - 100.0 fL   MCH 32.8 26.0 - 34.0 pg   MCHC 31.9 30.0 - 36.0 g/dL   RDW 42.7 06.2 - 37.6 %   Platelets 220 150 - 400 K/uL   nRBC 0.0 0.0 - 0.2 %    Comment: Performed at [facility], [address] [place], [place] [address]  Comprehensive metabolic panel     Status: Abnormal   Collection Time: 01/05/23 10:51 AM  Result Value Ref Range   Sodium 138 135 - 145 mmol/L   Potassium 4.2 3.5 - 5.1 mmol/L   Chloride 100 98 - 111 mmol/L   CO2 30 22 - 32 mmol/L   Glucose, Bld 134 (H) 70 - 99 mg/dL    Comment: Glucose reference range applies only to samples taken after fasting for at least 8 hours.   BUN 29 (H) 8 - 23 mg/dL   Creatinine, Ser 1.76 (H) 0.44 - 1.00 mg/dL   Calcium 9.2 8.9 - 16.0 mg/dL   Total Protein 6.9 6.5 - 8.1 g/dL   Albumin 3.5 3.5 - 5.0 g/dL   AST 19 15 - 41 U/L   ALT 15 0 - 44 U/L   Alkaline Phosphatase 118 38 - 126 U/L   Total Bilirubin 0.6 0.3 - 1.2 mg/dL   GFR, Estimated 38 (L) >60 mL/min    Comment: (NOTE) Calculated using the CKD-EPI Creatinine Equation (2021)    Anion gap 8 5 - 15    Comment: Performed at [facility], [address] [place], [place] [address]  C-reactive protein     Status: None   Collection Time: 01/05/23 10:51 AM  Result  Value Ref Range   CRP 0.6 <1.0 mg/dL    Comment: Performed at [facility], [address], [place], [place] [address]  Sedimentation rate     Status: Abnormal   Collection Time: 01/05/23 10:51 AM  Result Value Ref Range   Sed Rate 52 (H) 0 - 30 mm/hr    Comment: Performed at [facility], [address] [place], [place] [address]  Hemoglobin A1c     Status: Abnormal   Collection Time: 01/05/23 10:51 AM  Result Value Ref Range   Hgb A1c MFr Bld 6.1 (H) 4.8 - 5.6 %    Comment: (NOTE) Pre diabetes:          5.7%-6.4%  Diabetes:              >  6.4%  Glycemic control for   <7.0% adults with diabetes    Mean Plasma Glucose 128.37 mg/dL    Comment: Performed at [facility], [address] [place], [place] [address]  IgE     Status: None   Collection Time: 01/05/23 10:51 AM  Result Value Ref Range   IgE (Immunoglobulin E), Serum 154 6 - 495 IU/mL    Comment: (NOTE) Performed At: [facility] [address] [place], [place] [id number] Jolene Schimke MD [id number]   Glucose, capillary     Status: Abnormal   Collection Time: 01/18/23  6:15 AM  Result Value Ref Range   Glucose-Capillary 113 (H) 70 - 99 mg/dL    Comment: Glucose reference range applies only to samples taken after fasting for at least 8 hours.  ABO/Rh     Status: None   Collection Time: 01/18/23  6:40 AM  Result Value Ref Range   ABO/RH(D)      A NEG Performed at [facility], [address] [place], [place] [address]   Glucose, capillary     Status: Abnormal   Collection Time: 01/18/23 11:32 AM  Result Value Ref Range   Glucose-Capillary 166 (H) 70 - 99 mg/dL    Comment: Glucose reference range applies only to samples taken after fasting for at least 8 hours.  Comprehensive metabolic panel     Status: Abnormal   Collection Time: 01/27/23  6:27 PM  Result Value Ref Range   Sodium 134 (L) 135 - 145 mmol/L   Potassium 4.5 3.5 - 5.1 mmol/L   Chloride 100 98 - 111  mmol/L   CO2 25 22 - 32 mmol/L   Glucose, Bld 123 (H) 70 - 99 mg/dL    Comment: Glucose reference range applies only to samples taken after fasting for at least 8 hours.   BUN 15 8 - 23 mg/dL   Creatinine, Ser 7.84 0.44 - 1.00 mg/dL   Calcium 8.8 (L) 8.9 - 10.3 mg/dL   Total Protein 6.3 (L) 6.5 - 8.1 g/dL   Albumin 2.7 (L) 3.5 - 5.0 g/dL   AST 23 15 - 41 U/L   ALT 18 0 - 44 U/L   Alkaline Phosphatase 98 38 - 126 U/L   Total Bilirubin 0.7 0.3 - 1.2 mg/dL   GFR, Estimated 58 (L) >60 mL/min    Comment: (NOTE) Calculated using the CKD-EPI Creatinine Equation (2021)    Anion gap 9 5 - 15    Comment: Performed at [facility], [address] [place], [place] [address]  CBC     Status: Abnormal   Collection Time: 01/27/23  6:27 PM  Result Value Ref Range   WBC 8.3 4.0 - 10.5 K/uL   RBC 2.78 (L) 3.87 - 5.11 MIL/uL   Hemoglobin 8.8 (L) 12.0 - 15.0 g/dL   HCT 52.8 (L) 41.3 - 24.4 %   MCV 101.1 (H) 80.0 - 100.0 fL   MCH 31.7 26.0 - 34.0 pg   MCHC 31.3 30.0 - 36.0 g/dL   RDW 01.0 27.2 - 53.6 %   Platelets 311 150 - 400 K/uL   nRBC 0.0 0.0 - 0.2 %    Comment: Performed at [facility], [address] [place], [place] [address]  APTT     Status: Abnormal   Collection Time: 01/27/23  6:27 PM  Result Value Ref Range   aPTT 48 (H) 24 - 36 seconds    Comment:  IF BASELINE aPTT IS ELEVATED, SUGGEST PATIENT RISK ASSESSMENT BE USED TO DETERMINE APPROPRIATE ANTICOAGULANT THERAPY. Performed at [facility], [address] [place], [place] [address]   Lipase, blood     Status: None   Collection Time: 01/27/23  6:27 PM  Result Value Ref Range   Lipase 34 11 - 51 U/L    Comment: Performed at [facility], [address] [place], [place] [address]  Lipid panel     Status: Abnormal   Collection Time: 01/28/23  4:00 AM  Result Value Ref Range   Cholesterol 86 0 - 200 mg/dL   Triglycerides 55 <829 mg/dL   HDL 29 (L) >56 mg/dL   Total CHOL/HDL  Ratio 3.0 RATIO   VLDL 11 0 - 40 mg/dL   LDL Cholesterol 46 0 - 99 mg/dL    Comment:        Total Cholesterol/HDL:CHD Risk Coronary Heart Disease Risk Table                     Men   Women  1/2 Average Risk   3.4   3.3  Average Risk       5.0   4.4  2 X Average Risk   9.6   7.1  3 X Average Risk  23.4   11.0        Use the calculated Patient Ratio above and the CHD Risk Table to determine the patient's CHD Risk.        ATP III CLASSIFICATION (LDL):  <100     mg/dL   Optimal  213-086  mg/dL   Near or Above                    Optimal  130-159  mg/dL   Borderline  578-469  mg/dL   High  >629     mg/dL   Very High Performed at [facility], [address] [place], [place] [address]   Basic metabolic panel     Status: Abnormal   Collection Time: 01/28/23  4:00 AM  Result Value Ref Range   Sodium 136 135 - 145 mmol/L   Potassium 4.5 3.5 - 5.1 mmol/L   Chloride 104 98 - 111 mmol/L   CO2 27 22 - 32 mmol/L   Glucose, Bld 124 (H) 70 - 99 mg/dL    Comment: Glucose reference range applies only to samples taken after fasting for at least 8 hours.   BUN 14 8 - 23 mg/dL   Creatinine, Ser 3.24 (H) 0.44 - 1.00 mg/dL   Calcium 8.7 (L) 8.9 - 10.3 mg/dL   GFR, Estimated 55 (L) >60 mL/min    Comment: (NOTE) Calculated using the CKD-EPI Creatinine Equation (2021)    Anion gap 5 5 - 15    Comment: Performed at [facility], [address] [place], [place] [address]  CBC     Status: Abnormal   Collection Time: 01/28/23  4:00 AM  Result Value Ref Range   WBC 7.7 4.0 - 10.5 K/uL   RBC 2.67 (L) 3.87 - 5.11 MIL/uL   Hemoglobin 8.6 (L) 12.0 - 15.0 g/dL   HCT 72.5 (L) 36.6 - 44.0 %   MCV 101.1 (H) 80.0 - 100.0 fL   MCH 32.2 26.0 - 34.0 pg   MCHC 31.9 30.0 - 36.0 g/dL   RDW 34.7 42.5 - 95.6 %   Platelets 295 150 - 400 K/uL   nRBC 0.0 0.0 -  0.2 %    Comment: Performed at [facility], [address] [place], [place] [address]  Glucose, capillary     Status:  Abnormal   Collection Time: 01/28/23  5:51 AM  Result Value Ref Range   Glucose-Capillary 134 (H) 70 - 99 mg/dL    Comment: Glucose reference range applies only to samples taken after fasting for at least 8 hours.  Glucose, capillary     Status: Abnormal   Collection Time: 01/28/23  8:38 AM  Result Value Ref Range   Glucose-Capillary 108 (H) 70 - 99 mg/dL    Comment: Glucose reference range applies only to samples taken after fasting for at least 8 hours.  ECHOCARDIOGRAM COMPLETE BUBBLE STUDY     Status: None   Collection Time: 01/28/23  8:42 AM  Result Value Ref Range   Ao pk vel 1.97 m/s   AV Area VTI 1.66 cm2   AR max vel 1.49 cm2   AV Mean grad 8.3 mmHg   AV Peak grad 15.6 mmHg   S' Lateral 2.50 cm   AV Area mean vel 1.57 cm2   Area-P 1/2 4.26 cm2   Est EF 60 - 65%   Glucose, capillary     Status: None   Collection Time: 01/28/23 11:09 AM  Result Value Ref Range   Glucose-Capillary 97 70 - 99 mg/dL    Comment: Glucose reference range applies only to samples taken after fasting for at least 8 hours.  Glucose, capillary     Status: None   Collection Time: 01/28/23  4:10 PM  Result Value Ref Range   Glucose-Capillary 96 70 - 99 mg/dL    Comment: Glucose reference range applies only to samples taken after fasting for at least 8 hours.  Glucose, capillary     Status: Abnormal   Collection Time: 01/28/23  8:28 PM  Result Value Ref Range   Glucose-Capillary 122 (H) 70 - 99 mg/dL    Comment: Glucose reference range applies only to samples taken after fasting for at least 8 hours.  Glucose, capillary     Status: Abnormal   Collection Time: 01/29/23  8:33 AM  Result Value Ref Range   Glucose-Capillary 115 (H) 70 - 99 mg/dL    Comment: Glucose reference range applies only to samples taken after fasting for at least 8 hours.  Glucose, capillary     Status: Abnormal   Collection Time: 01/29/23 12:05 PM  Result Value Ref Range   Glucose-Capillary 168 (H) 70 - 99 mg/dL    Comment:  Glucose reference range applies only to samples taken after fasting for at least 8 hours.  Glucose, capillary     Status: Abnormal   Collection Time: 01/29/23  4:07 PM  Result Value Ref Range   Glucose-Capillary 140 (H) 70 - 99 mg/dL    Comment: Glucose reference range applies only to samples taken after fasting for at least 8 hours.  Glucose, capillary     Status: Abnormal   Collection Time: 01/30/23  8:21 AM  Result Value Ref Range   Glucose-Capillary 117 (H) 70 - 99 mg/dL    Comment: Glucose reference range applies only to samples taken after fasting for at least 8 hours.  Glucose, capillary     Status: Abnormal   Collection Time: 01/30/23 12:41 PM  Result Value Ref Range   Glucose-Capillary 133 (H) 70 - 99 mg/dL    Comment: Glucose reference range applies only to samples taken after fasting for at least  8 hours.  Glucose, capillary     Status: Abnormal   Collection Time: 01/30/23  4:20 PM  Result Value Ref Range   Glucose-Capillary 143 (H) 70 - 99 mg/dL    Comment: Glucose reference range applies only to samples taken after fasting for at least 8 hours.  Urinalysis, w/ Reflex to Culture (Infection Suspected) -     Status: Abnormal   Collection Time: 02/02/23  5:43 PM  Result Value Ref Range   Specimen Source URINE, CLEAN CATCH    Color, Urine STRAW (A) YELLOW   APPearance CLEAR CLEAR   Specific Gravity, Urine 1.005 1.005 - 1.030   pH 6.0 5.0 - 8.0   Glucose, UA NEGATIVE NEGATIVE mg/dL   Hgb urine dipstick NEGATIVE NEGATIVE   Bilirubin Urine NEGATIVE NEGATIVE   Ketones, ur NEGATIVE NEGATIVE mg/dL   Protein, ur NEGATIVE NEGATIVE mg/dL   Nitrite NEGATIVE NEGATIVE   Leukocytes,Ua LARGE (A) NEGATIVE   RBC / HPF 0-5 0 - 5 RBC/hpf   WBC, UA 21-50 0 - 5 WBC/hpf    Comment:        Reflex urine culture not performed if WBC <=10, OR if Squamous epithelial cells >5. If Squamous epithelial cells >5 suggest recollection.    Bacteria, UA RARE (A) NONE SEEN   Squamous Epithelial /  HPF 0-5 0 - 5 /HPF    Comment: Performed at [facility], [address] [place], [place] [address]  Urine Culture     Status: Abnormal   Collection Time: 02/02/23  5:43 PM   Specimen: Urine, Random  Result Value Ref Range   Specimen Description URINE, RANDOM    Special Requests NONE Reflexed from [id number]    Culture (A)     <10,000 COLONIES/mL INSIGNIFICANT GROWTH Performed at [facility], [address] [place], [place] [address]    Report Status 02/03/2023 FINAL   Comprehensive metabolic panel     Status: Abnormal   Collection Time: 02/03/23  6:12 AM  Result Value Ref Range   Sodium 134 (L) 135 - 145 mmol/L   Potassium 3.6 3.5 - 5.1 mmol/L   Chloride 99 98 - 111 mmol/L   CO2 28 22 - 32 mmol/L   Glucose, Bld 142 (H) 70 - 99 mg/dL    Comment: Glucose reference range applies only to samples taken after fasting for at least 8 hours.   BUN 22 8 - 23 mg/dL   Creatinine, Ser 1.47 (H) 0.44 - 1.00 mg/dL   Calcium 8.6 (L) 8.9 - 10.3 mg/dL   Total Protein 5.8 (L) 6.5 - 8.1 g/dL   Albumin 2.2 (L) 3.5 - 5.0 g/dL   AST 21 15 - 41 U/L   ALT 25 0 - 44 U/L   Alkaline Phosphatase 93 38 - 126 U/L   Total Bilirubin 0.6 0.3 - 1.2 mg/dL   GFR, Estimated 45 (L) >60 mL/min    Comment: (NOTE) Calculated using the CKD-EPI Creatinine Equation (2021)    Anion gap 7 5 - 15    Comment: Performed at [facility], [address] [place], [place] [address]  CBC with Differential/Platelet     Status: Abnormal   Collection Time: 02/03/23  6:12 AM  Result Value Ref Range   WBC 9.9 4.0 - 10.5 K/uL   RBC 2.63 (L) 3.87 - 5.11 MIL/uL   Hemoglobin 8.2 (L) 12.0 - 15.0 g/dL   HCT 21.3 (L) 08.6 - 57.8 %   MCV 96.2 80.0 -  100.0 fL   MCH 31.2 26.0 - 34.0 pg   MCHC 32.4 30.0 - 36.0 g/dL   RDW 84.6 96.2 - 95.2 %   Platelets 366 150 - 400 K/uL   nRBC 0.0 0.0 - 0.2 %   Neutrophils Relative % 80 %   Neutro Abs 7.9 (H) 1.7 - 7.7 K/uL   Lymphocytes Relative 12 %   Lymphs Abs 1.2 0.7 - 4.0 K/uL    Monocytes Relative 6 %   Monocytes Absolute 0.6 0.1 - 1.0 K/uL   Eosinophils Relative 1 %   Eosinophils Absolute 0.1 0.0 - 0.5 K/uL   Basophils Relative 0 %   Basophils Absolute 0.0 0.0 - 0.1 K/uL   Immature Granulocytes 1 %   Abs Immature Granulocytes 0.05 0.00 - 0.07 K/uL    Comment: Performed at [facility], [address], [place], [place] [address]  Glucose, capillary     Status: Abnormal   Collection Time: 02/08/23  4:52 PM  Result Value Ref Range   Glucose-Capillary 150 (H) 70 - 99 mg/dL    Comment: Glucose reference range applies only to samples taken after fasting for at least 8 hours.  Basic metabolic panel     Status: Abnormal   Collection Time: 02/09/23  7:25 AM  Result Value Ref Range   Sodium 137 135 - 145 mmol/L   Potassium 3.6 3.5 - 5.1 mmol/L   Chloride 103 98 - 111 mmol/L   CO2 27 22 - 32 mmol/L   Glucose, Bld 138 (H) 70 - 99 mg/dL    Comment: Glucose reference range applies only to samples taken after fasting for at least 8 hours.   BUN 19 8 - 23 mg/dL   Creatinine, Ser 4.40 (H) 0.44 - 1.00 mg/dL   Calcium 8.9 8.9 - 10.2 mg/dL   GFR, Estimated 49 (L) >60 mL/min    Comment: (NOTE) Calculated using the CKD-EPI Creatinine Equation (2021)    Anion gap 7 5 - 15    Comment: Performed at [facility], [address] [place], [place] [address]    Radiology DG Chest 2 View  Result Date: 02/02/2023 CLINICAL DATA:  Chest pain EXAM: CHEST - 2 VIEW COMPARISON:  02/18/2021 FINDINGS: The lungs are symmetrically well expanded. Small bilateral pleural effusions. Lungs are clear. No pneumothorax. Cardiac size is within normal limits. Pulmonary vascularity is normal. No acute bone abnormality. IMPRESSION: 1. Small bilateral pleural effusions. Electronically Signed   By: Helyn Numbers M.D.   On: 02/02/2023 22:05   MR BRAIN WO CONTRAST  Result Date: 01/29/2023 CLINICAL DATA:  Stroke, follow-up EXAM: MRI HEAD WITHOUT CONTRAST TECHNIQUE: Multiplanar, multiecho  pulse sequences of the brain and surrounding structures were obtained without intravenous contrast. COMPARISON:  01/27/2023 MRI head, correlation is made with 01/29/2023 CT head FINDINGS: Brain: Again noted are scattered foci of restricted diffusion with ADC correlates in the left frontal, parietal, temporal, and occipital cortex and white matter and left caudate; several of these have increased in conspicuity since the prior exam in the vein consistent with acute infarcts. Likely new small acute focus of restricted diffusion with ADC correlate in the left corona radiata (series 9, image 27). No acute hemorrhage, mass, mass effect, or midline shift. No hydrocephalus or extra-axial collection. Confluent T2 hyperintense signal in the periventricular white matter and pons, likely the sequela of moderate to severe chronic small vessel ischemic disease. Dilated perivascular spaces in the bilateral basal ganglia. Vascular: No seen on the prior  CTA, poor visualization of the right V4 flow void. Otherwise normal arterial flow voids. Skull and upper cervical spine: Normal marrow signal. Sinuses/Orbits: Clear paranasal sinuses. No acute finding in the orbits. Status post bilateral lens replacements. Other: The mastoid air cells are well aerated. 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 Otherwise unchanged scattered acute to early subacute infarcts throughout the left cerebral hemisphere. No acute hemorrhage, mass effect, or midline shift. These results will be called to the ordering clinician or representative by the Radiologist Assistant, and communication documented in the PACS or [employer]. Electronically Signed   By: Wiliam Ke M.D.   On: 01/29/2023 23:58   CT ANGIO HEAD NECK W WO CM (CODE STROKE)  Result Date: 01/29/2023 CLINICAL DATA:  Neuro deficit, stroke suspected. Abnormal MRI with left-sided infarcts. EXAM: CT ANGIOGRAPHY HEAD AND NECK WITH AND WITHOUT CONTRAST TECHNIQUE:  Multidetector CT imaging of the head and neck was performed using the standard protocol during bolus administration of intravenous contrast. Multiplanar CT image reconstructions and MIPs were obtained to evaluate the vascular anatomy. Carotid stenosis measurements (when applicable) are obtained utilizing NASCET criteria, using the distal internal carotid diameter as the denominator. RADIATION DOSE REDUCTION: This exam was performed according to the departmental dose-optimization program which includes automated exposure control, adjustment of the mA and/or kV according to patient size and/or use of iterative reconstruction technique. CONTRAST:  75mL OMNIPAQUE IOHEXOL 350 MG/ML SOLN COMPARISON:  MR head without contrast 01/27/2023. CT head without contrast 01/28/2023 and CT head without contrast 01/29/2023. FINDINGS: CTA NECK FINDINGS Aortic arch: Atherosclerotic calcifications are again noted at the aortic arch and great vessel origins. No stenosis or aneurysm is present. Right carotid system: The right common carotid artery is within normal limits. Mural calcifications are stable. The high-grade stenosis of greater than 80% is present at the proximal right ICA. Previous study underestimated the degree of stenosis. No significant interval change is present. Left carotid system: The left common carotid artery demonstrates extensive mural calcification without proximal stenosis. A high-grade, near occlusive stenosis is present in the proximal left ICA. More distal left ICA is slightly smaller in caliber than the right. Vertebral arteries: The vertebral arteries both originate from the subclavian arteries. The left vertebral artery is dominant. A 50% stenosis present at its origin. The right vertebral artery is hypoplastic. It is occluded in the V3 segment is proximal to the dura. No significant stenosis is present on the left. Skeleton: Multilevel degenerative changes are again noted within the cervical spine. No  focal osseous lesions are present. Other neck: Soft tissues the neck are otherwise unremarkable. Salivary glands are within normal limits. Thyroid is normal. No significant adenopathy is present. No focal mucosal or submucosal lesions are present. Upper chest: Small bilateral pleural effusions are present. Mild dependent atelectasis is present. No nodule or mass lesion is present. No significant airspace disease is present otherwise. Review of the MIP images confirms the above findings CTA HEAD FINDINGS Anterior circulation: Atherosclerotic calcifications are again noted within the cavernous internal carotid arteries bilaterally. No significant stenosis or interval change is present. The A1 and M1 segments are normal bilaterally. The anterior communicating artery is patent. MCA bifurcations within normal limits bilaterally. The ACA and MCA branch vessels are within normal limits. No aneurysm is present. Posterior circulation: Left vertebral artery is scratched at atherosclerotic changes are present at the dural margin of the left vertebral artery without a significant stenosis. Left PICA origin is visualized and normal. The right  V4 segment fills to the PICA, likely in retrograde fashion. The basilar artery is normal. Both superior cerebellar arteries are patent. The left posterior cerebral artery originates from basilar tip. The right posterior cerebral artery is of fetal type with a small right P1 segment. A proximal right P2 segment stenosis is again noted. In mid left P2 segment stenosis is present. PCA branch vessels are otherwise unremarkable and stable. Venous sinuses: The dural sinuses are patent. The straight sinus and deep cerebral veins are intact. Cortical veins are within normal limits. No significant vascular malformation is evident. Anatomic variants: Fetal type right posterior cerebral artery. Review of the MIP images confirms the above findings IMPRESSION: 1. 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 2. 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 3. Stable 50% stenosis at the origin of the dominant left vertebral artery. 4. 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 5. Stable proximal right P2 segment stenosis. 6. Stable mid left P2 segment stenosis. 7. Stable atherosclerotic changes within the cavernous internal carotid arteries bilaterally without significant stenosis. 8. No significant proximal stenosis, aneurysm, or branch vessel occlusion within the Circle of Willis. 9.  Aortic Atherosclerosis (ICD10-I70.0). These results were called by telephone at the time of interpretation on 01/29/2023 at 4:31 Pm to provider [facility] , who verbally acknowledged these results. The final report was delayed by technical issues. Electronically Signed   By: Marin Roberts M.D.   On: 01/29/2023 17:50   CT HEAD CODE STROKE WO CONTRAST  Result Date: 01/29/2023 CLINICAL DATA:  Code stroke.  Neuro deficit, acute, stroke suspected EXAM: CT HEAD WITHOUT CONTRAST TECHNIQUE: Contiguous axial images were obtained from the base of the skull through the vertex without intravenous contrast. RADIATION DOSE REDUCTION: This exam was performed according to the departmental dose-optimization program which includes automated exposure control, adjustment of the mA and/or kV according to patient size and/or use of iterative reconstruction technique. COMPARISON:  None Available. FINDINGS: Brain: No evidence of acute infarction, hemorrhage, hydrocephalus, extra-axial collection or mass lesion/mass effect. Patchy white matter hypodensities, nonspecific but compatible with chronic microvascular ischemic disease. Vascular: No hyperdense vessel. Skull: No acute fracture. Sinuses/Orbits: Clear sinuses.  No acute orbital findings. Other: No mastoid effusions. ASPECTS [facility] Stroke Program Early CT Score) total score (0-10  with 10 being normal): 10. IMPRESSION: 1. No evidence of acute intracranial abnormality. 2. ASPECTS is 10. Code stroke imaging results were communicated on 01/29/2023 at 3:58 pm to provider Dr. Amada Jupiter via secure text paging. Electronically Signed   By: Feliberto Harts M.D.   On: 01/29/2023 15:58   ECHOCARDIOGRAM COMPLETE BUBBLE STUDY  Result Date: 01/28/2023    ECHOCARDIOGRAM REPORT   Patient Name:   Brooke Davis [facility] Date of Exam: 01/28/2023 Medical Rec #:  [id number]        Height:       62.0 in Accession #:    [id number]       Weight:       193.1 lb Date of Birth:  1941/07/15        BSA:          1.883 m Patient Age:    81 years         BP:           112/61 mmHg Patient Gender: F                HR:  65 bpm. Exam Location:  [facility] Procedure: 2D Echo, Cardiac Doppler, Color Doppler and Saline Contrast Bubble            Study Indications:     TIA 435.9 / G45.9  History:         Patient has prior history of Echocardiogram examinations, most                  recent 01/14/2021. Arrythmias:Atrial Fibrillation; Risk                  Factors:Hypertension.  Sonographer:     Cristela Blue Referring Phys:  [id number] JAN A MANSY Diagnosing Phys: Julien Nordmann MD  Sonographer Comments: Suboptimal apical window. IMPRESSIONS  1. Left ventricular ejection fraction, by estimation, is 60 to 65%. The left ventricle has normal function. The left ventricle has no regional wall motion abnormalities. Left ventricular diastolic parameters are indeterminate.  2. Right ventricular systolic function is normal. The right ventricular size is normal. There is normal pulmonary artery systolic pressure. The estimated right ventricular systolic pressure is 26.3 mmHg.  3. Left atrial size was moderately dilated.  4. Right atrial size was mildly dilated.  5. The mitral valve is normal in structure. Mild mitral valve regurgitation. No evidence of mitral stenosis.  6. Tricuspid valve regurgitation is mild to moderate.  7. The aortic valve is  normal in structure. Aortic valve regurgitation is not visualized. Aortic valve sclerosis is present, with no evidence of aortic valve stenosis.  8. The inferior vena cava is normal in size with greater than 50% respiratory variability, suggesting right atrial pressure of 3 mmHg.  9. Agitated saline contrast bubble study was negative, with no evidence of any interatrial shunt. FINDINGS  Left Ventricle: Left ventricular ejection fraction, by estimation, is 60 to 65%. The left ventricle has normal function. The left ventricle has no regional wall motion abnormalities. The left ventricular internal cavity size was normal in size. There is  no left ventricular hypertrophy. Left ventricular diastolic parameters are indeterminate. Right Ventricle: The right ventricular size is normal. No increase in right ventricular wall thickness. Right ventricular systolic function is normal. There is normal pulmonary artery systolic pressure. The tricuspid regurgitant velocity is 2.31 m/s, and  with an assumed right atrial pressure of 5 mmHg, the estimated right ventricular systolic pressure is 26.3 mmHg. Left Atrium: Left atrial size was moderately dilated. Right Atrium: Right atrial size was mildly dilated. Pericardium: There is no evidence of pericardial effusion. Mitral Valve: The mitral valve is normal in structure. There is mild calcification of the mitral valve leaflet(s). Mild mitral annular calcification. Mild mitral valve regurgitation. No evidence of mitral valve stenosis. Tricuspid Valve: The tricuspid valve is normal in structure. Tricuspid valve regurgitation is mild to moderate. No evidence of tricuspid stenosis. Aortic Valve: The aortic valve is normal in structure. Aortic valve regurgitation is not visualized. Aortic valve sclerosis is present, with no evidence of aortic valve stenosis. Aortic valve mean gradient measures 8.3 mmHg. Aortic valve peak gradient measures 15.6 mmHg. Aortic valve area, by VTI measures 1.66  cm. Pulmonic Valve: The pulmonic valve was normal in structure. Pulmonic valve regurgitation is not visualized. No evidence of pulmonic stenosis. Aorta: The aortic root is normal in size and structure. Venous: The inferior vena cava is normal in size with greater than 50% respiratory variability, suggesting right atrial pressure of 3 mmHg. IAS/Shunts: No atrial level shunt detected by color flow Doppler. Agitated saline contrast was given intravenously to evaluate  for intracardiac shunting. Agitated saline contrast bubble study was negative, with no evidence of any interatrial shunt. There  is no evidence of a patent foramen ovale. There is no evidence of an atrial septal defect.  LEFT VENTRICLE PLAX 2D LVIDd:         3.90 cm   Diastology LVIDs:         2.50 cm   LV e' medial:    8.16 cm/s LV PW:         0.80 cm   LV E/e' medial:  13.7 LV IVS:        1.40 cm   LV e' lateral:   11.10 cm/s LVOT diam:     2.00 cm   LV E/e' lateral: 10.1 LV SV:         67 LV SV Index:   36 LVOT Area:     3.14 cm  RIGHT VENTRICLE RV Basal diam:  3.70 cm RV Mid diam:    3.20 cm RV S prime:     13.10 cm/s TAPSE (M-mode): 1.3 cm LEFT ATRIUM             Index        RIGHT ATRIUM           Index LA diam:        4.10 cm 2.18 cm/m   RA Area:     22.60 cm LA Vol (A2C):   68.3 ml 36.26 ml/m  RA Volume:   74.90 ml  39.77 ml/m LA Vol (A4C):   82.3 ml 43.70 ml/m LA Biplane Vol: 80.7 ml 42.85 ml/m  AORTIC VALVE AV Area (Vmax):    1.49 cm AV Area (Vmean):   1.57 cm AV Area (VTI):     1.66 cm AV Vmax:           197.33 cm/s AV Vmean:          133.667 cm/s AV VTI:            0.406 m AV Peak Grad:      15.6 mmHg AV Mean Grad:      8.3 mmHg LVOT Vmax:         93.40 cm/s LVOT Vmean:        67.000 cm/s LVOT VTI:          0.214 m LVOT/AV VTI ratio: 0.53  AORTA Ao Root diam: 3.30 cm MITRAL VALVE                TRICUSPID VALVE MV Area (PHT): 4.26 cm     TR Peak grad:   21.3 mmHg MV Decel Time: 178 msec     TR Vmax:        231.00 cm/s MV E velocity:  112.00 cm/s                             SHUNTS                             Systemic VTI:  0.21 m                             Systemic Diam: 2.00 cm Julien Nordmann MD Electronically signed by Julien Nordmann MD Signature Date/Time: 01/28/2023/12:52:11 PM    Final    CT HEAD CODE STROKE WO CONTRAST`  Addendum Date: 01/28/2023  ADDENDUM REPORT: 01/28/2023 06:48 ADDENDUM: Study discussed by telephone with Dr. Valente David on 01/28/2023 at 0642 hours. Electronically Signed   By: Odessa Fleming M.D.   On: 01/28/2023 06:48   Result Date: 01/28/2023 CLINICAL DATA:  Code stroke. 82 year old female with weakness and altered mental status, change in stroke symptoms. Numerous scattered small foci of ischemia in the left hemisphere on MRI yesterday. EXAM: CT HEAD WITHOUT CONTRAST TECHNIQUE: Contiguous axial images were obtained from the base of the skull through the vertex without intravenous contrast. RADIATION DOSE REDUCTION: This exam was performed according to the departmental dose-optimization program which includes automated exposure control, adjustment of the mA and/or kV according to patient size and/or use of iterative reconstruction technique. COMPARISON:  Brain MRI 2317 hours yesterday. Head CT 1859 hours yesterday. FINDINGS: Brain: No acute intracranial hemorrhage identified. No midline shift, mass effect, or evidence of intracranial mass lesion. Stable ventricle size and configuration. Patchy bilateral white matter hypodensity is stable. The multiple small foci of abnormal diffusion scattered in the left hemisphere on MRI last night are largely occult by CT. Stable gray-white matter differentiation throughout the brain. No new cortically based infarct identified. Vascular: No suspicious intracranial vascular hyperdensity. Calcified atherosclerosis at the skull base. Skull: No acute osseous abnormality identified. Mild motion artifact. Sinuses/Orbits: Visualized paranasal sinuses and mastoids are stable and well aerated.  Other: No gaze deviation, acute orbit or scalp soft tissue finding. ASPECTS [facility] Stroke Program Early CT Score) Total score (0-10 with 10 being normal): 10 IMPRESSION: Stable noncontrast CT appearance of the brain since yesterday, the multiple small infarcts by MRI are largely occult by CT. No new intracranial abnormality. Electronically Signed: By: Odessa Fleming M.D. On: 01/28/2023 06:37   MR BRAIN WO CONTRAST  Result Date: 01/28/2023 CLINICAL DATA: Confusion, right upper extremity weakness EXAM: MRI HEAD WITHOUT CONTRAST TECHNIQUE: Multiplanar, multiecho pulse sequences of the brain and surrounding structures were obtained without intravenous contrast. COMPARISON:  No prior MRI available, correlation is made with 01/27/2023 CT head and CTA head and neck FINDINGS: Evaluation is significantly limited by motion, with the exception of the diffusion-weighted sequence. Brain: Scattered foci of cortical and white matter restricted diffusion with ADC correlates in the left cerebral hemisphere and left caudate (series 5, images 20-38). Evaluation for T2 hyperintense correlate is limited by motion artifact No definite acute hemorrhage, mass, mass effect, or midline shift. No hydrocephalus or extra-axial collection. Vascular: As seen on the prior CTA, the right V4 flow void is not well seen. Otherwise normal arterial flow voids. Skull and upper cervical spine: Grossly normal marrow signal. Sinuses/Orbits: Clear paranasal sinuses. Status post bilateral lens replacements. Other: The mastoid air cells are well aerated. IMPRESSION: Evaluation is significantly limited by motion, with the exception of the diffusion-weighted sequence. 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 These results were called by telephone at the time of interpretation on 01/28/2023 at 1:05 am to provider [facility] , who verbally acknowledged these results. Electronically Signed    By: Wiliam Ke M.D.   On: 01/28/2023 01:05   CT ANGIO HEAD NECK W WO CM  Result Date: 01/27/2023 CLINICAL DATA:  Confusion and right upper extremity weakness EXAM: CT ANGIOGRAPHY HEAD AND NECK WITH AND WITHOUT CONTRAST TECHNIQUE: Multidetector CT imaging of the head and neck was performed using the standard protocol during bolus administration of intravenous contrast. Multiplanar CT image reconstructions and MIPs were obtained to evaluate the vascular  anatomy. Carotid stenosis measurements (when applicable) are obtained utilizing NASCET criteria, using the distal internal carotid diameter as the denominator. RADIATION DOSE REDUCTION: This exam was performed according to the departmental dose-optimization program which includes automated exposure control, adjustment of the mA and/or kV according to patient size and/or use of iterative reconstruction technique. CONTRAST:  75mL OMNIPAQUE IOHEXOL 350 MG/ML SOLN COMPARISON:  No prior CTA available, correlation is made with CT head 01/27/2023 FINDINGS: CT HEAD FINDINGS For noncontrast findings, please see same day CT head. CTA NECK FINDINGS Aortic arch: Evaluation of the arch is somewhat limited by motion. Within this limitation, Two-vessel arch with a common origin of the brachiocephalic and left common carotid arteries. Imaged portion shows no evidence of aneurysm or dissection. No significant stenosis of the major arch vessel origins. Mild aortic atherosclerosis. Right carotid system: 60% stenosis in the proximal right ICA (series 6, image 194). Additional atherosclerotic in the right common carotid artery is not hemodynamically significant. Left carotid system: 70% stenosis in the proximal left ICA (series 6, image 199). Additional atherosclerotic disease in the common carotid artery is not hemodynamically significant. Vertebral arteries: Left dominant system. The left vertebral artery is patent from its origin to the skull base without significant stenosis or  evidence of dissection. The right vertebral artery is quite diminutive, and poorly perfused in the distal V3 segment (series 6, image 159), likely reflecting moderate stenosis. Skeleton: No acute osseous abnormality. Degenerative changes in the cervical spine. Other neck: Hypoenhancing and calcified nodules in the thyroid, the largest of which measures up to 6 mm, for which no follow-up is currently indicated. (Reference: J Am Coll Radiol. 2015 Feb;12(2): 143-50) Upper chest: No focal pulmonary opacity or pleural effusion. Mild centrilobular emphysema. Review of the MIP images confirms the above findings CTA HEAD FINDINGS Anterior circulation: Both internal carotid arteries are patent to the termini, with mild stenosis in the bilateral cavernous and supraclinoid segments. A1 segments patent. Normal anterior communicating artery. Anterior cerebral arteries are patent to their distal aspects without significant stenosis. No M1 stenosis or occlusion. MCA branches are irregular but perfused to their distal aspects without significant stenosis. Posterior circulation: Nonopacification of the proximal right V4 (series 6, image 146), consistent with occlusion. The more distal right V4 is opacified, likely retrograde. The left vertebral artery is patent to the vertebrobasilar junction with mild stenosis in the proximal V4 (series 6, image 141). Posteroinferior cerebellar arteries are not definitively visualized. Basilar patent to its distal aspect without significant stenosis. Superior cerebellar arteries patent proximally. Patent P1 segments. Mild stenosis in the proximal right P2 (series 6, image 99), and mid left P 2 (series 6, image 105). The remainder of the PCAs are irregular but do not demonstrate significant stenosis. The right posterior communicating artery is patent. The left is not definitively seen. Venous sinuses: As permitted by contrast timing, patent. Anatomic variants: None significant. Review of the MIP  images confirms the above findings IMPRESSION: 1. 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 This is of indeterminate acuity. 2. 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 3. Mild stenosis in the bilateral cavernous and supraclinoid ICAs. 4. Mild stenosis in the proximal right P2 and mid left P2. 5. Aortic atherosclerosis. 6. Emphysema. Aortic Atherosclerosis (ICD10-I70.0) and Emphysema (ICD10-J43.9). These results were called by telephone at the time of interpretation on 01/27/2023 at 10:07 pm to provider [facility] , who verbally acknowledged  these results. Electronically Signed   By: Wiliam Ke M.D.   On: 01/27/2023 22:07   CT Head Wo Contrast  Result Date: 01/27/2023 CLINICAL DATA:  Confusion. EXAM: CT HEAD WITHOUT CONTRAST TECHNIQUE: Contiguous axial images were obtained from the base of the skull through the vertex without intravenous contrast. RADIATION DOSE REDUCTION: This exam was performed according to the departmental dose-optimization program which includes automated exposure control, adjustment of the mA and/or kV according to patient size and/or use of iterative reconstruction technique. COMPARISON:  Head CT dated 10/03/2021. FINDINGS: Brain: Mild age-related atrophy and moderate chronic microvascular ischemic changes. There is no acute intracranial hemorrhage. No mass effect or midline shift. No extra-axial fluid collection. Vascular: No hyperdense vessel or unexpected calcification. Skull: Normal. Negative for fracture or focal lesion. Sinuses/Orbits: No acute finding. Other: None IMPRESSION: 1. No acute intracranial pathology. 2. Mild age-related atrophy and moderate chronic microvascular ischemic changes. Electronically Signed   By: Elgie Collard M.D.   On: 01/27/2023 19:14   DG HIP UNILAT W OR W/O PELVIS 2-3 VIEWS RIGHT  Result Date: 01/18/2023 CLINICAL DATA:  Status post right hip  arthroplasty EXAM: DG HIP (WITH OR WITHOUT PELVIS) 2-3V RIGHT COMPARISON:  None Available. FINDINGS: There is recent right hip arthroplasty. No fracture is seen. Surgical drain is noted in place. There are pockets of air and skin staples from recent surgery. IMPRESSION: Status post right hip arthroplasty. Electronically Signed   By: Ernie Avena M.D.   On: 01/18/2023 12:46    Assessment/Plan  Bilateral carotid artery stenosis The patient has a significant left-sided carotid stenosis of 70% or greater with a stroke earlier this month.  This would be a clear indication for repair.  We discussed the options of repair which would include carotid endarterectomy, carotid stenting, or TCAR.  We discussed the risks and benefits of the procedure.  The patient seems to have anatomy that would be amenable to carotid stenting and this was what we chose today.  We will go ahead and get her started back on Plavix 75 mg daily and I would like her to be on that for at least 5 days before the procedure.  We discussed that she will need to be on this for at least 90 days after the procedure.  We discussed the expected 1 night hospital stay in the ICU.  We discussed the risks and benefits of all the procedures.  We also discussed the risks and benefits of medical management.  Patient and her daughter voiced their understanding and desire to proceed.  Primary hypertension blood pressure control important in reducing the progression of atherosclerotic disease. On appropriate oral medications.   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 Also, involved in wound healing. On appropriate medications.   Dyslipidemia lipid control important in reducing the progression of atherosclerotic disease. Continue statin  therapy    Festus Barren, MD  02/16/2023 1:38 PM    This note was created with Dragon medical transcription system.  Any errors from dictation are purely unintentional

## 2023-02-16 NOTE — Assessment & Plan Note (Signed)
blood glucose control important in reducing the progression of atherosclerotic disease. Also, involved in wound healing. On appropriate medications.  

## 2023-02-16 NOTE — Assessment & Plan Note (Signed)
The patient has a significant left-sided carotid stenosis of 70% or greater with a stroke earlier this month.  This would be a clear indication for repair.  We discussed the options of repair which would include carotid endarterectomy, carotid stenting, or TCAR.  We discussed the risks and benefits of the procedure.  The patient seems to have anatomy that would be amenable to carotid stenting and this was what we chose today.  We will go ahead and get her started back on Plavix 75 mg daily and I would like her to be on that for at least 5 days before the procedure.  We discussed that she will need to be on this for at least 90 days after the procedure.  We discussed the expected 1 night hospital stay in the ICU.  We discussed the risks and benefits of all the procedures.  We also discussed the risks and benefits of medical management.  Patient and her daughter voiced their understanding and desire to proceed.

## 2023-02-16 NOTE — Patient Instructions (Signed)
Carotid Angioplasty With Stent Carotid angioplasty with stent is a procedure to open or widen an artery in the neck (carotid artery) that has become narrowed. This is done by inflating a small balloon inside the artery and then placing a small piece of metal that looks like a coil or spring (stent) inside the artery. The stent helps keep the artery open by supporting the artery walls. The carotid arteries supply blood to the brain. When fats, cholesterol, and other materials (plaque) build up in an artery, the artery becomes narrow and can become blocked. This can reduce or block blood flow to certain areas of the brain, which can cause serious health problems, including stroke. The stent helps to keep the artery open so that blood can flow to the brain. Tell a health care provider about: Any allergies you have. All medicines you are taking, including vitamins, herbs, eye drops, creams, and over-the-counter medicines. Any problems you or family members have had with anesthesia. Any bleeding problems you have. Any surgeries you have had. Any medical conditions you have. Whether you are pregnant or may be pregnant. What are the risks? Your health care provider will talk with you about risks. These may include: Stroke. The stent becoming blocked. Problems at the access site, such as a large amount of blood collecting under your skin (hematoma). Allergic reactions to medicines or dyes. Damage to other structures or organs, or to the carotid artery. Infection. Heart attack. Death. This is rare. What happens before the procedure? Follow instructions from your health care provider about what you may eat and drink. Ask your health care provider about: Changing or stopping your regular medicines. These include any diabetes medicines or blood thinners (anticoagulants) you take. Whether aspirin or other blood thinners are recommended before this procedure. Taking over-the-counter medicines, vitamins,  herbs, and supplements. Do not use any products that contain nicotine or tobacco for at least 4 weeks before the procedure. These products include cigarettes, chewing tobacco, and vaping devices, such as e-cigarettes. If you need help quitting, ask your health care provider. Ask your health care provider: How your surgery site will be marked. What steps will be taken to help prevent infection. These steps may include washing skin with a soap that kills germs. You may have blood tests and imaging tests. What happens during the procedure?  An IV will be inserted into one of your veins. You may be given: A sedative. This helps you relax. Anesthesia. This will: Numb certain areas of your body. Make you fall asleep for surgery. Most commonly, an incision will be made in your groin. In some cases, an incision may be made in your wrist or forearm instead of your groin. A small, thin tube (catheter) will be inserted through an incision and into an artery. The catheter will be threaded upward into your carotid artery. An X-ray machine will help your health care provider guide the catheter to the correct place in your artery. Dye will be injected into the catheter and will travel to the narrow or blocked part of your carotid artery. X-ray images will be taken of how the dye flows through your artery. While the images are being taken, you may be given instructions about breathing, swallowing, moving, or talking. A filter will be inserted into your artery. This will be used to catch plaque that comes loose in your artery during the procedure. This reduces the risk of plaque moving into your brain. A small balloon will be inserted into your artery.  The balloon will be inflated for a few seconds to widen your artery and will then be removed. The stent will be placed in your artery. A second small balloon will be inserted into your artery and inflated. This expands the stent inside of your artery so that the  stent holds the artery walls open. The second balloon will then be removed. The catheter, filter, and first balloon will be removed from your artery and pressure will be held on your carotid artery to stop bleeding. Your incision may be closed with stitches (sutures), skin glue, or adhesive strips. A bandage (dressing) will be placed over your incision. The procedure may vary among health care providers and hospitals. What happens after the procedure? Your blood pressure, heart rate, breathing rate, and blood oxygen level will be monitored until you leave the hospital or clinic. Your mental status and movements (neurological status) will be monitored. You may need to have pressure placed on the incision site to prevent bleeding. You will need to keep the area still for a few hours, or as long as told by your health care provider. If the procedure was done in your groin, you will be told not to bend or cross your legs. Most people stay in the hospital overnight. This information is not intended to replace advice given to you by your health care provider. Make sure you discuss any questions you have with your health care provider. Document Revised: 12/02/2021 Document Reviewed: 12/02/2021 Elsevier Patient Education  2024 [employer].

## 2023-02-16 NOTE — Assessment & Plan Note (Signed)
Plan for carotid treatment as above

## 2023-02-16 NOTE — Assessment & Plan Note (Signed)
blood pressure control important in reducing the progression of atherosclerotic disease. On appropriate oral medications.  

## 2023-02-18 ENCOUNTER — Telehealth (INDEPENDENT_AMBULATORY_CARE_PROVIDER_SITE_OTHER): Payer: Self-pay

## 2023-02-18 NOTE — Telephone Encounter (Signed)
ERROR

## 2023-02-18 NOTE — Telephone Encounter (Signed)
Spoke with the patient and she is scheduled with Dr. Wyn Quaker for left carotid stent on 02/25/23 with a 8:00 am arrival time to the [facility]. Pre-procedure instructions were discussed and will be sent to Mychart and mailed.

## 2023-02-18 NOTE — Telephone Encounter (Signed)
Error

## 2023-02-23 ENCOUNTER — Encounter: Payer: Medicare Other | Attending: Registered Nurse | Admitting: Registered Nurse

## 2023-02-25 ENCOUNTER — Encounter
Admission: RE | Disposition: A | Discharge status: Home Health | Payer: Self-pay | Source: Ambulatory Visit | Attending: Vascular Surgery

## 2023-02-25 ENCOUNTER — Encounter: Payer: Self-pay | Admitting: Vascular Surgery

## 2023-02-25 ENCOUNTER — Other Ambulatory Visit: Payer: Self-pay

## 2023-02-25 ENCOUNTER — Inpatient Hospital Stay
Admission: RE | Admit: 2023-02-25 | Discharge: 2023-03-01 | DRG: 036 | Disposition: A | Discharge status: Home Health | Payer: Medicare Other | Source: Ambulatory Visit | Attending: Vascular Surgery | Admitting: Vascular Surgery

## 2023-02-25 DIAGNOSIS — Z823 Family history of stroke: Secondary | ICD-10-CM | POA: Diagnosis not present

## 2023-02-25 DIAGNOSIS — Z96641 Presence of right artificial hip joint: Secondary | ICD-10-CM | POA: Diagnosis present

## 2023-02-25 DIAGNOSIS — Z7901 Long term (current) use of anticoagulants: Secondary | ICD-10-CM | POA: Diagnosis not present

## 2023-02-25 DIAGNOSIS — Z833 Family history of diabetes mellitus: Secondary | ICD-10-CM | POA: Diagnosis not present

## 2023-02-25 DIAGNOSIS — M109 Gout, unspecified: Secondary | ICD-10-CM | POA: Diagnosis present

## 2023-02-25 DIAGNOSIS — I252 Old myocardial infarction: Secondary | ICD-10-CM

## 2023-02-25 DIAGNOSIS — Z811 Family history of alcohol abuse and dependence: Secondary | ICD-10-CM

## 2023-02-25 DIAGNOSIS — E1165 Type 2 diabetes mellitus with hyperglycemia: Secondary | ICD-10-CM | POA: Diagnosis present

## 2023-02-25 DIAGNOSIS — E785 Hyperlipidemia, unspecified: Secondary | ICD-10-CM | POA: Diagnosis present

## 2023-02-25 DIAGNOSIS — R130 Aphagia: Secondary | ICD-10-CM | POA: Diagnosis present

## 2023-02-25 DIAGNOSIS — Z8673 Personal history of transient ischemic attack (TIA), and cerebral infarction without residual deficits: Secondary | ICD-10-CM

## 2023-02-25 DIAGNOSIS — Z87891 Personal history of nicotine dependence: Secondary | ICD-10-CM | POA: Diagnosis not present

## 2023-02-25 DIAGNOSIS — Q2549 Other congenital malformations of aorta: Secondary | ICD-10-CM

## 2023-02-25 DIAGNOSIS — I2510 Atherosclerotic heart disease of native coronary artery without angina pectoris: Secondary | ICD-10-CM | POA: Diagnosis present

## 2023-02-25 DIAGNOSIS — Z8249 Family history of ischemic heart disease and other diseases of the circulatory system: Secondary | ICD-10-CM | POA: Diagnosis not present

## 2023-02-25 DIAGNOSIS — Z7902 Long term (current) use of antithrombotics/antiplatelets: Secondary | ICD-10-CM

## 2023-02-25 DIAGNOSIS — I63239 Cerebral infarction due to unspecified occlusion or stenosis of unspecified carotid arteries: Secondary | ICD-10-CM | POA: Diagnosis not present

## 2023-02-25 DIAGNOSIS — I6522 Occlusion and stenosis of left carotid artery: Secondary | ICD-10-CM | POA: Diagnosis not present

## 2023-02-25 DIAGNOSIS — R451 Restlessness and agitation: Secondary | ICD-10-CM | POA: Diagnosis not present

## 2023-02-25 DIAGNOSIS — Z886 Allergy status to analgesic agent status: Secondary | ICD-10-CM

## 2023-02-25 DIAGNOSIS — Z7982 Long term (current) use of aspirin: Secondary | ICD-10-CM | POA: Diagnosis not present

## 2023-02-25 DIAGNOSIS — Z880 Allergy status to penicillin: Secondary | ICD-10-CM

## 2023-02-25 DIAGNOSIS — Z96651 Presence of right artificial knee joint: Secondary | ICD-10-CM | POA: Diagnosis present

## 2023-02-25 DIAGNOSIS — I10 Essential (primary) hypertension: Secondary | ICD-10-CM | POA: Diagnosis present

## 2023-02-25 DIAGNOSIS — Z79899 Other long term (current) drug therapy: Secondary | ICD-10-CM | POA: Diagnosis not present

## 2023-02-25 DIAGNOSIS — Z888 Allergy status to other drugs, medicaments and biological substances status: Secondary | ICD-10-CM | POA: Diagnosis not present

## 2023-02-25 DIAGNOSIS — I4891 Unspecified atrial fibrillation: Secondary | ICD-10-CM | POA: Diagnosis present

## 2023-02-25 HISTORY — PX: CAROTID PTA/STENT INTERVENTION: CATH118231

## 2023-02-25 LAB — BUN: BUN: 26 mg/dL — ABNORMAL HIGH (ref 8–23)

## 2023-02-25 LAB — CREATININE, SERUM
Creatinine, Ser: 1.22 mg/dL — ABNORMAL HIGH (ref 0.44–1.00)
GFR, Estimated: 45 mL/min — ABNORMAL LOW (ref >60)

## 2023-02-25 LAB — GLUCOSE, CAPILLARY: Glucose-Capillary: 111 mg/dL — ABNORMAL HIGH (ref 70–99)

## 2023-02-25 LAB — MRSA NEXT GEN BY PCR, NASAL: MRSA by PCR Next Gen: NOT DETECTED

## 2023-02-25 SURGERY — CAROTID PTA/STENT INTERVENTION
Anesthesia: Moderate Sedation | Laterality: Left

## 2023-02-25 MED ORDER — POLYETHYLENE GLYCOL 3350 17 G PO PACK: 17.0000 g | PACK | Freq: Every day | ORAL | Status: DC | PRN

## 2023-02-25 MED ORDER — TRAMADOL HCL 50 MG PO TABS: 100.0000 mg | ORAL_TABLET | Freq: Three times a day (TID) | ORAL | Status: DC | PRN

## 2023-02-25 MED ORDER — DIPHENHYDRAMINE HCL 50 MG/ML IJ SOLN: 50.0000 mg | Freq: Once | INTRAMUSCULAR | Status: DC | PRN

## 2023-02-25 MED ORDER — ONDANSETRON HCL 4 MG/2ML IJ SOLN: 4.0000 mg | Freq: Four times a day (QID) | INTRAMUSCULAR | Status: DC | PRN

## 2023-02-25 MED ORDER — OXYCODONE-ACETAMINOPHEN 5-325 MG PO TABS: 1.0000 | ORAL_TABLET | ORAL | Status: DC | PRN

## 2023-02-25 MED ORDER — SODIUM CHLORIDE 0.9 % IV SOLN
500.0000 mL | Freq: Once | INTRAVENOUS | Status: AC | PRN
  Administered 2023-02-25 (×2): 500 mL via INTRAVENOUS

## 2023-02-25 MED ORDER — HEPARIN SODIUM (PORCINE) 1000 UNIT/ML IJ SOLN
INTRAMUSCULAR | Status: AC
  Filled 2023-02-25: qty 10

## 2023-02-25 MED ORDER — MIDAZOLAM HCL 2 MG/2ML IJ SOLN
INTRAMUSCULAR | Status: DC | PRN
  Administered 2023-02-25: 1 mg via INTRAVENOUS

## 2023-02-25 MED ORDER — ALUM & MAG HYDROXIDE-SIMETH 200-200-20 MG/5ML PO SUSP: 15.0000 mL | ORAL | Status: DC | PRN

## 2023-02-25 MED ORDER — ATROPINE SULFATE 1 MG/10ML IJ SOSY
PREFILLED_SYRINGE | INTRAMUSCULAR | Status: DC | PRN
  Administered 2023-02-25: 1 mg via INTRAVENOUS

## 2023-02-25 MED ORDER — SODIUM CHLORIDE 0.9 % IV SOLN: INTRAVENOUS | Status: DC

## 2023-02-25 MED ORDER — CHLORHEXIDINE GLUCONATE CLOTH 2 % EX PADS
6.0000 | MEDICATED_PAD | Freq: Every day | CUTANEOUS | Status: DC
  Administered 2023-02-25 – 2023-03-01 (×5): 6 via TOPICAL

## 2023-02-25 MED ORDER — FAMOTIDINE 20 MG PO TABS: 40.0000 mg | ORAL_TABLET | Freq: Once | ORAL | Status: DC | PRN

## 2023-02-25 MED ORDER — ASPIRIN 81 MG PO TBEC
81.0000 mg | DELAYED_RELEASE_TABLET | Freq: Every day | ORAL | Status: DC
  Administered 2023-02-26 – 2023-03-01 (×4): 81 mg via ORAL
  Filled 2023-02-25 (×4): qty 1

## 2023-02-25 MED ORDER — ATORVASTATIN CALCIUM 20 MG PO TABS
80.0000 mg | ORAL_TABLET | Freq: Every day | ORAL | Status: DC
  Administered 2023-02-26 – 2023-03-01 (×4): 80 mg via ORAL
  Filled 2023-02-25 (×4): qty 4

## 2023-02-25 MED ORDER — FAMOTIDINE IN NACL 20-0.9 MG/50ML-% IV SOLN
20.0000 mg | Freq: Two times a day (BID) | INTRAVENOUS | Status: DC
  Administered 2023-02-25 – 2023-02-26 (×2): 20 mg via INTRAVENOUS
  Filled 2023-02-25 (×2): qty 50

## 2023-02-25 MED ORDER — DOPAMINE-DEXTROSE 3.2-5 MG/ML-% IV SOLN
INTRAVENOUS | Status: AC
  Filled 2023-02-25: qty 250

## 2023-02-25 MED ORDER — ATROPINE SULFATE 1 MG/10ML IJ SOSY
PREFILLED_SYRINGE | INTRAMUSCULAR | Status: AC
  Filled 2023-02-25: qty 20

## 2023-02-25 MED ORDER — APIXABAN 5 MG PO TABS: 5.0000 mg | ORAL_TABLET | Freq: Two times a day (BID) | ORAL | Status: DC

## 2023-02-25 MED ORDER — ALPRAZOLAM 0.25 MG PO TABS
0.2500 mg | ORAL_TABLET | Freq: Two times a day (BID) | ORAL | Status: DC | PRN
  Administered 2023-02-25 – 2023-02-26 (×2): 0.25 mg via ORAL
  Filled 2023-02-25 (×2): qty 1

## 2023-02-25 MED ORDER — FENTANYL CITRATE (PF) 100 MCG/2ML IJ SOLN
INTRAMUSCULAR | Status: AC
  Filled 2023-02-25: qty 2

## 2023-02-25 MED ORDER — METOPROLOL TARTRATE 25 MG PO TABS
25.0000 mg | ORAL_TABLET | Freq: Two times a day (BID) | ORAL | Status: DC
  Administered 2023-02-25 – 2023-03-01 (×7): 25 mg via ORAL
  Filled 2023-02-25 (×8): qty 1

## 2023-02-25 MED ORDER — POTASSIUM CHLORIDE CRYS ER 20 MEQ PO TBCR
10.0000 meq | EXTENDED_RELEASE_TABLET | Freq: Every day | ORAL | Status: DC
  Administered 2023-02-26 – 2023-03-01 (×4): 10 meq via ORAL
  Filled 2023-02-25 (×4): qty 1

## 2023-02-25 MED ORDER — LIDOCAINE-EPINEPHRINE (PF) 1 %-1:200000 IJ SOLN
INTRAMUSCULAR | Status: DC | PRN
  Administered 2023-02-25: 10 mL

## 2023-02-25 MED ORDER — TRAMADOL HCL 50 MG PO TABS
100.0000 mg | ORAL_TABLET | Freq: Three times a day (TID) | ORAL | Status: DC | PRN
  Administered 2023-02-25 – 2023-02-28 (×4): 100 mg via ORAL
  Filled 2023-02-25 (×2): qty 2

## 2023-02-25 MED ORDER — OXYCODONE HCL 5 MG PO TABS: 5.0000 mg | ORAL_TABLET | ORAL | Status: DC | PRN

## 2023-02-25 MED ORDER — APIXABAN 5 MG PO TABS
5.0000 mg | ORAL_TABLET | Freq: Two times a day (BID) | ORAL | Status: DC
  Administered 2023-02-26 – 2023-03-01 (×7): 5 mg via ORAL
  Filled 2023-02-25 (×8): qty 1

## 2023-02-25 MED ORDER — MELATONIN 5 MG PO TABS
5.0000 mg | ORAL_TABLET | Freq: Every evening | ORAL | Status: DC | PRN
  Administered 2023-02-26 – 2023-02-28 (×2): 5 mg via ORAL
  Filled 2023-02-25 (×2): qty 1

## 2023-02-25 MED ORDER — MIDAZOLAM HCL 2 MG/ML PO SYRP: 8.0000 mg | ORAL_SOLUTION | Freq: Once | ORAL | Status: DC | PRN

## 2023-02-25 MED ORDER — ACETAMINOPHEN 325 MG PO TABS: 650.0000 mg | ORAL_TABLET | Freq: Four times a day (QID) | ORAL | Status: DC | PRN

## 2023-02-25 MED ORDER — LABETALOL HCL 5 MG/ML IV SOLN: 10.0000 mg | INTRAVENOUS | Status: DC | PRN

## 2023-02-25 MED ORDER — METOPROLOL TARTRATE 5 MG/5ML IV SOLN: 2.0000 mg | INTRAVENOUS | Status: DC | PRN

## 2023-02-25 MED ORDER — TORSEMIDE 20 MG PO TABS
20.0000 mg | ORAL_TABLET | Freq: Every day | ORAL | Status: DC
  Administered 2023-02-27 – 2023-03-01 (×3): 20 mg via ORAL
  Filled 2023-02-25 (×4): qty 1

## 2023-02-25 MED ORDER — PHENOL 1.4 % MT LIQD: 1.0000 | OROMUCOSAL | Status: DC | PRN

## 2023-02-25 MED ORDER — SODIUM CHLORIDE 0.9 % IV SOLN: 250.0000 mL | INTRAVENOUS | Status: DC

## 2023-02-25 MED ORDER — HYDROMORPHONE HCL 1 MG/ML IJ SOLN
1.0000 mg | Freq: Once | INTRAMUSCULAR | Status: AC | PRN
  Administered 2023-02-25: 1 mg via INTRAVENOUS
  Filled 2023-02-25: qty 1

## 2023-02-25 MED ORDER — POTASSIUM CHLORIDE CRYS ER 20 MEQ PO TBCR: 20.0000 meq | EXTENDED_RELEASE_TABLET | Freq: Every day | ORAL | Status: DC | PRN

## 2023-02-25 MED ORDER — PHENYLEPHRINE HCL-NACL 20-0.9 MG/250ML-% IV SOLN: 0.0000 ug/min | INTRAVENOUS | Status: DC

## 2023-02-25 MED ORDER — CLOPIDOGREL BISULFATE 75 MG PO TABS
75.0000 mg | ORAL_TABLET | Freq: Every day | ORAL | Status: DC
  Administered 2023-02-26 – 2023-03-01 (×4): 75 mg via ORAL
  Filled 2023-02-25 (×4): qty 1

## 2023-02-25 MED ORDER — HYDRALAZINE HCL 20 MG/ML IJ SOLN: 5.0000 mg | INTRAMUSCULAR | Status: DC | PRN

## 2023-02-25 MED ORDER — MAGNESIUM SULFATE 2 GM/50ML IV SOLN: 2.0000 g | Freq: Every day | INTRAVENOUS | Status: DC | PRN

## 2023-02-25 MED ORDER — HEPARIN SODIUM (PORCINE) 1000 UNIT/ML IJ SOLN
INTRAMUSCULAR | Status: DC | PRN
  Administered 2023-02-25: 2000 [IU] via INTRAVENOUS
  Administered 2023-02-25: 8000 [IU] via INTRAVENOUS

## 2023-02-25 MED ORDER — TRAMADOL HCL 50 MG PO TABS
ORAL_TABLET | ORAL | Status: AC
  Filled 2023-02-25: qty 2

## 2023-02-25 MED ORDER — CEFAZOLIN SODIUM-DEXTROSE 2-4 GM/100ML-% IV SOLN
2.0000 g | INTRAVENOUS | Status: AC
  Administered 2023-02-25: 2 g via INTRAVENOUS

## 2023-02-25 MED ORDER — PHENYLEPHRINE HCL-NACL 20-0.9 MG/250ML-% IV SOLN
INTRAVENOUS | Status: AC
  Filled 2023-02-25: qty 250

## 2023-02-25 MED ORDER — CLONAZEPAM 0.5 MG PO TABS: 0.5000 mg | ORAL_TABLET | Freq: Three times a day (TID) | ORAL | Status: DC | PRN

## 2023-02-25 MED ORDER — ACETAMINOPHEN 325 MG RE SUPP: 325.0000 mg | RECTAL | Status: DC | PRN

## 2023-02-25 MED ORDER — IODIXANOL 320 MG/ML IV SOLN
INTRAVENOUS | Status: DC | PRN
  Administered 2023-02-25: 75 mL

## 2023-02-25 MED ORDER — CEFAZOLIN SODIUM-DEXTROSE 2-4 GM/100ML-% IV SOLN
2.0000 g | Freq: Three times a day (TID) | INTRAVENOUS | Status: AC
  Administered 2023-02-25 – 2023-02-26 (×2): 2 g via INTRAVENOUS
  Filled 2023-02-25 (×3): qty 100

## 2023-02-25 MED ORDER — ALLOPURINOL 100 MG PO TABS
300.0000 mg | ORAL_TABLET | Freq: Every day | ORAL | Status: DC
  Administered 2023-02-26 – 2023-03-01 (×4): 300 mg via ORAL
  Filled 2023-02-25: qty 1
  Filled 2023-02-25: qty 3
  Filled 2023-02-25 (×2): qty 1

## 2023-02-25 MED ORDER — HEPARIN (PORCINE) IN NACL 1000-0.9 UT/500ML-% IV SOLN
INTRAVENOUS | Status: DC | PRN
  Administered 2023-02-25: 3000 mL

## 2023-02-25 MED ORDER — PHENYLEPHRINE 80 MCG/ML (10ML) SYRINGE FOR IV PUSH (FOR BLOOD PRESSURE SUPPORT)
PREFILLED_SYRINGE | INTRAVENOUS | Status: AC
  Filled 2023-02-25: qty 10

## 2023-02-25 MED ORDER — PHENYLEPHRINE HCL-NACL 20-0.9 MG/250ML-% IV SOLN
25.0000 ug/min | INTRAVENOUS | Status: DC
  Filled 2023-02-25: qty 250

## 2023-02-25 MED ORDER — ACETAMINOPHEN 325 MG PO TABS
325.0000 mg | ORAL_TABLET | ORAL | Status: DC | PRN
  Administered 2023-02-26 – 2023-02-27 (×2): 650 mg via ORAL
  Filled 2023-02-25 (×2): qty 2

## 2023-02-25 MED ORDER — MORPHINE SULFATE (PF) 4 MG/ML IV SOLN: 2.0000 mg | INTRAVENOUS | Status: DC | PRN

## 2023-02-25 MED ORDER — FENTANYL CITRATE (PF) 100 MCG/2ML IJ SOLN
INTRAMUSCULAR | Status: DC | PRN
  Administered 2023-02-25: 50 ug via INTRAVENOUS
  Administered 2023-02-25: 25 ug via INTRAVENOUS

## 2023-02-25 MED ORDER — METHYLPREDNISOLONE SODIUM SUCC 125 MG IJ SOLR: 125.0000 mg | Freq: Once | INTRAMUSCULAR | Status: DC | PRN

## 2023-02-25 MED ORDER — CITALOPRAM HYDROBROMIDE 20 MG PO TABS
20.0000 mg | ORAL_TABLET | Freq: Every day | ORAL | Status: DC
  Administered 2023-02-26 – 2023-03-01 (×4): 20 mg via ORAL
  Filled 2023-02-25 (×4): qty 1

## 2023-02-25 MED ORDER — CEFAZOLIN SODIUM-DEXTROSE 2-4 GM/100ML-% IV SOLN
INTRAVENOUS | Status: AC
  Filled 2023-02-25: qty 100

## 2023-02-25 MED ORDER — GUAIFENESIN-DM 100-10 MG/5ML PO SYRP: 15.0000 mL | ORAL_SOLUTION | ORAL | Status: DC | PRN

## 2023-02-25 MED ORDER — MIDAZOLAM HCL 2 MG/2ML IJ SOLN
INTRAMUSCULAR | Status: AC
  Filled 2023-02-25: qty 2

## 2023-02-25 SURGICAL SUPPLY — 25 items
BALLN VTRAC 4.5X30X135 (BALLOONS) ×1
BALLOON VTRAC 4.5X30X135 (BALLOONS) IMPLANT
CANNULA 5F STIFF (CANNULA) IMPLANT
CATH ANGIO 5F PIGTAIL 100CM (CATHETERS) IMPLANT
CATH BEACON 5 .035 100 H1 TIP (CATHETERS) IMPLANT
CATH SLIP .038X100 JB2 (CATHETERS) IMPLANT
COVER DRAPE FLUORO 36X44 (DRAPES) IMPLANT
COVER EZ STRL 42X30 (DRAPES) IMPLANT
COVER PROBE ULTRASOUND 5X96 (MISCELLANEOUS) IMPLANT
DEVICE EMBOSHIELD NAV6 4.0-7.0 (FILTER) IMPLANT
DEVICE SAFEGUARD 24CM (GAUZE/BANDAGES/DRESSINGS) IMPLANT
DEVICE STARCLOSE SE CLOSURE (Vascular Products) IMPLANT
GLIDEWIRE ADV .035X260CM (WIRE) IMPLANT
GLIDEWIRE ANGLED SS 035X260CM (WIRE) IMPLANT
GLIDEWIRE STIFF .35X180X3 HYDR (WIRE) IMPLANT
GUIDEWIRE VASC STIFF .038X260 (WIRE) IMPLANT
KIT CAROTID MANIFOLD (MISCELLANEOUS) IMPLANT
KIT ENCORE 26 ADVANTAGE (KITS) IMPLANT
PACK ANGIOGRAPHY (CUSTOM PROCEDURE TRAY) ×1 IMPLANT
SHEATH BRITE TIP 5FRX11 (SHEATH) IMPLANT
SHEATH SHUTTLE 6FRX80 (SHEATH) IMPLANT
STENT XACT CAR 9-7X40X136 (Permanent Stent) IMPLANT
SYR MEDRAD MARK 7 150ML (SYRINGE) IMPLANT
TUBING CONTRAST HIGH PRESS 72 (TUBING) IMPLANT
WIRE GUIDERIGHT .035X150 (WIRE) IMPLANT

## 2023-02-25 NOTE — Interval H&P Note (Signed)
History and Physical Interval Note:  02/25/2023 9:05 AM  Brooke Davis  has presented today for surgery, with the diagnosis of L Carotid Stent   ABBOTT   Carotid artery stenosis.  The various methods of treatment have been discussed with the patient and family. After consideration of risks, benefits and other options for treatment, the patient has consented to  Procedure(s): CAROTID PTA/STENT INTERVENTION (Left) as a surgical intervention.  The patient's history has been reviewed, patient examined, no change in status, stable for surgery.  I have reviewed the patient's chart and labs.  Questions were answered to the patient's satisfaction.     Festus Barren

## 2023-02-25 NOTE — Progress Notes (Signed)
IV team consult for USG PIV.  Per [facility] RN, USG PIV not needed as the Neo has not been started and at this point pt is stable.  RN notified to place consult if status changes.

## 2023-02-25 NOTE — Op Note (Signed)
OPERATIVE NOTE DATE: 02/25/2023  PROCEDURE:  Ultrasound guidance for vascular access right femoral artery  Placement of a 9 mm proximal, 7 mm distal 4 cm long Exact stent with the use of the NAV-6 embolic protection device in the left carotid artery  PRE-OPERATIVE DIAGNOSIS: 1. High grade left carotid artery stenosis. 2. Previous left hemispheric stroke  POST-OPERATIVE DIAGNOSIS:  Same as above  SURGEON: Festus Barren, MD  ASSISTANT(S):  none  ANESTHESIA: local/MCS  ESTIMATED BLOOD LOSS:  50 cc  CONTRAST: 75 cc  FLUORO TIME: 7.6 minutes  MODERATE CONSCIOUS SEDATION TIME:  Approximately 45 minutes using 1 mg of Versed and 75 mcg of Fentanyl  FINDING(S): 1.   90-95% ulcerated left carotid artery stenosis  SPECIMEN(S):   none  INDICATIONS:   Patient is a 82 y.o. female who presents with left carotid artery stenosis.  The patient has multiple medical issues and carotid artery stenting was felt to be preferred to endarterectomy for that reason. I have completed Share Decision Making with DYMONE HORSFALL prior to surgery.  Conversations included: -Discussion of all treatment options including carotid endarterectomy (CEA), CAS (which includes transcarotid artery revascularization (TCAR)), and optimal medical therapy (OMT)). -Explanation of risks and benefits for each option specific to Dierdre Harness Lauro's clinical situation. -Integration of clinical guidelines as it relates to the patient's history and co-morbidities -Discussion and incorporation of Amador Cunas and their personal preferences and priorities in choosing a treatment plan.  If patient was unable to participate in Shared Decision Making this process was done with the patient and family   Risks and benefits were discussed and informed consent was obtained.   DESCRIPTION: After obtaining full informed written consent, the patient was brought back to the vascular suite and placed supine upon the table.  The patient  received IV antibiotics prior to induction. Moderate conscious sedation was administered during a face to face encounter with the patient throughout the procedure with my supervision of the RN administering medicines and monitoring the patients vital signs and mental status throughout from the start of the procedure until the patient was taken to the recovery room.  After obtaining adequate anesthesia, the patient was prepped and draped in the standard fashion.   The right femoral artery was visualized with ultrasound and found to be widely patent. It was then accessed under direct ultrasound guidance without difficulty with a Seldinger needle. A permanent image was recorded. A J-wire was placed and we then placed a 6 French sheath. The patient was then heparinized and a total of 16109 units of intravenous heparin were given and an ACT was checked to confirm successful anticoagulation. A pigtail catheter was then placed into the ascending aorta. 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 I then selectively cannulated the left carotid artery without difficulty with a JB2 catheter and advanced into the mid left common carotid artery.  Cervical and cerebral carotid angiography was then performed. There were no obvious intracranial filling defects with no cross filling. The carotid bifurcation demonstrated a 90-95% ulcerated left carotid artery stenosis.  I then advanced into the external carotid artery with a Glidewire and the JB2 catheter and then exchanged for the Amplatz Super Stiff wire. Over the Amplatz Super Stiff wire, a 6 [place] shuttle sheath was placed into the mid common carotid artery. I then used the NAV-6  Embolic protection device and crossed the lesion and parked this in the distal internal carotid artery at the base of the skull.  I then selected a 9 mm proximal, 7 mm distal 4 cm long Exact stent. This was deployed across the lesion encompassing it in its entirety. A 4.5 mm diameter  length balloon was used to post dilate the stent. Only about a 10% residual stenosis was present after angioplasty. Completion angiogram showed normal intracranial filling without new defects. At this point I elected to terminate the procedure. The sheath was removed and StarClose closure device was deployed in the right femoral artery with excellent hemostatic result. The patient was taken to the recovery room in stable condition having tolerated the procedure well.  COMPLICATIONS: none  CONDITION: stable  Festus Barren 02/25/2023 10:21 AM   This note was created with Dragon Medical transcription system. Any errors in dictation are purely unintentional.

## 2023-02-25 NOTE — Progress Notes (Signed)
Per Dr. Wyn Quaker, left femoral iv is a regular iv, not a sheath, and may be used for iv access.

## 2023-02-26 ENCOUNTER — Encounter: Payer: Self-pay | Admitting: Vascular Surgery

## 2023-02-26 LAB — TYPE AND SCREEN
ABO/RH(D): A NEG
Antibody Screen: NEGATIVE
Unit division: 0
Unit division: 0

## 2023-02-26 LAB — BPAM RBC
Blood Product Expiration Date: 202409022359
Blood Product Expiration Date: 202409032359
ISSUE DATE / TIME: 202408090846
ISSUE DATE / TIME: 202408091347
Unit Type and Rh: 600
Unit Type and Rh: 600

## 2023-02-26 LAB — PREPARE RBC (CROSSMATCH)

## 2023-02-26 LAB — HEMOGLOBIN AND HEMATOCRIT, BLOOD
HCT: 28.7 % — ABNORMAL LOW (ref 36.0–46.0)
Hemoglobin: 9.3 g/dL — ABNORMAL LOW (ref 12.0–15.0)

## 2023-02-26 MED ORDER — FAMOTIDINE 20 MG PO TABS
20.0000 mg | ORAL_TABLET | Freq: Two times a day (BID) | ORAL | Status: DC
  Administered 2023-02-26 – 2023-03-01 (×7): 20 mg via ORAL
  Filled 2023-02-26 (×7): qty 1

## 2023-02-26 MED ORDER — FUROSEMIDE 10 MG/ML IJ SOLN
20.0000 mg | Freq: Once | INTRAMUSCULAR | Status: AC
  Administered 2023-02-26: 20 mg via INTRAVENOUS
  Filled 2023-02-26: qty 2

## 2023-02-26 MED ORDER — SODIUM CHLORIDE 0.9% IV SOLUTION: Freq: Once | INTRAVENOUS | Status: AC

## 2023-02-26 MED ORDER — LORAZEPAM 2 MG/ML IJ SOLN
0.5000 mg | Freq: Once | INTRAMUSCULAR | Status: AC
  Administered 2023-02-26: 0.5 mg via INTRAVENOUS
  Filled 2023-02-26: qty 1

## 2023-02-26 MED ORDER — LORAZEPAM 2 MG/ML PO CONC
0.5000 mg | Freq: Once | ORAL | Status: AC
  Administered 2023-02-26: 0.5 mg via ORAL
  Filled 2023-02-26: qty 1

## 2023-02-26 NOTE — Progress Notes (Signed)
  Progress Note    02/26/2023 8:01 AM 1 Day Post-Op  Subjective:  Brooke Davis is an 82 yo female who is now POD#1 from Endovascular left carotid stent placement. She has a history of CVA from 2 weeks prior. Patients right groin incision she endorses as painful. Nursing reports patient had a very restless night. Patient was given 0.5 Ativan overnight.   On exam patient rests comfortably in bed. She answers all my questions appropriately. She acknowledges she is in the hospital at [facility]. She understands she has had a procedure yesterday. Patients HGB was 6.7 this morning. Patient appears to be at her baseline neurologically post her CVA. Nursing reports her aphagia appears to be better.     Vitals:   02/26/23 0528 02/26/23 0600  BP: (!) 113/57 118/62  Pulse: 70   Resp: 15 16  Temp: 98.6 F (37 C)   SpO2: 99%    Physical Exam: Cardiac:  RRR, Normal S1, S2. No murmurs Lungs:  Clear throughout on auscultation, normal respiratory effort. No rales, Rhonchi or wheezing.  Incisions:  Right groin incision with dressing clean dry and intact.  Extremities:  Palpable pulses throughout Abdomen:  Positive bowel sounds, soft, non tender and non distended.  Neurologic: AAOX3 and answers all questions appropriately.   CBC    Component Value Date/Time   WBC 6.6 02/26/2023 0331   RBC 2.24 (L) 02/26/2023 0331   HGB 6.7 (L) 02/26/2023 0331   HCT 21.6 (L) 02/26/2023 0331   PLT 262 02/26/2023 0331   MCV 96.4 02/26/2023 0331   MCH 29.9 02/26/2023 0331   MCHC 31.0 02/26/2023 0331   RDW 15.3 02/26/2023 0331   LYMPHSABS 1.2 02/03/2023 0612   MONOABS 0.6 02/03/2023 0612   EOSABS 0.1 02/03/2023 0612   BASOSABS 0.0 02/03/2023 0612    BMET    Component Value Date/Time   NA 141 02/26/2023 0331   K 3.5 02/26/2023 0331   CL 109 02/26/2023 0331   CO2 26 02/26/2023 0331   GLUCOSE 172 (H) 02/26/2023 0331   BUN 23 02/26/2023 0331   CREATININE 1.16 (H) 02/26/2023 0331   CALCIUM 8.5 (L) 02/26/2023  0331   GFRNONAA 48 (L) 02/26/2023 0331    INR    Component Value Date/Time   INR 1.4 (H) 07/14/2021 1917     Intake/Output Summary (Last 24 hours) at 02/26/2023 0801 Last data filed at 02/25/2023 2000 Gross per 24 hour  Intake 452.26 ml  Output --  Net 452.26 ml     Assessment/Plan:  82 y.o. female is s/p  Endovascular left carotid stent placement. 1 Day Post-Op   PLAN: Ordered 2 units PRBC's with Lasix 20 mg IVP between units.  Recheck H&H 1 hour after blood infusion complete.  Advance Diet as tolerated.  OOB with assist and ambulate in halls.  Start  ASA 81 mg daily, Plavix 75 mg Daily, Eliquis 5 mg daily.  DVT prophylaxis:   ASA 81 mg daily, Plavix 75 mg Daily, Eliquis 5 mg daily.  Patient to be discharged on ASA 81 mg daily, Plavix 75 mg Daily, Eliquis 5 mg daily and Lipitor 80 mg Daily.   Marcie Bal [facility] 02/26/2023 8:01 AM

## 2023-02-26 NOTE — Progress Notes (Signed)
PHARMACIST - PHYSICIAN COMMUNICATION  CONCERNING: IV to Oral Route Change Policy  RECOMMENDATION: This patient is receiving Pepcid by the intravenous route.  Based on criteria approved by the Pharmacy and Therapeutics Committee, the intravenous medication(s) is/are being converted to the equivalent oral dose form(s).  DESCRIPTION: These criteria include: The patient is eating (either orally or via tube) and/or has been taking other orally administered medications for a least 24 hours The patient has no evidence of active gastrointestinal bleeding or impaired GI absorption (gastrectomy, short bowel, patient on TNA or NPO).  If you have questions about this conversion, please contact the Pharmacy Department   Tressie Ellis, [facility] 02/26/2023 8:30 AM

## 2023-02-26 NOTE — Progress Notes (Signed)
Called daughter [name] to speak to her since she wokeup confused and combative thinking she was in her house.  She was insisting the computer was another bed.  Patient did take her night time medications with melatonin  hopefully to help her sleep and calm down.  Patient was able to talked to her family and calm down a little.  But very confused and needing a sitter.

## 2023-02-26 NOTE — Plan of Care (Signed)
Discussed with family in front of patient plan of care for the evening, pain management and night time medication with some teach back displayed.  What is important to the patient company and rest.  She doesn't like music at this time.  Problem: Education: Goal: Knowledge of disease or condition will improve Outcome: Progressing

## 2023-02-27 ENCOUNTER — Inpatient Hospital Stay: Payer: Medicare Other

## 2023-02-27 DIAGNOSIS — I63239 Cerebral infarction due to unspecified occlusion or stenosis of unspecified carotid arteries: Secondary | ICD-10-CM | POA: Diagnosis not present

## 2023-02-27 MED ORDER — QUETIAPINE FUMARATE 25 MG PO TABS
25.0000 mg | ORAL_TABLET | Freq: Every evening | ORAL | Status: DC | PRN
  Administered 2023-02-27: 25 mg via ORAL
  Filled 2023-02-27 (×2): qty 1

## 2023-02-27 NOTE — Consult Note (Signed)
Neurology Consultation  Reason for Consult: Altered mental status Referring Physician: Dr. Evie Lacks, vascular surgery  CC: Confusion and agitation  History is obtained from: Chart, patient's family and patient  HPI: Brooke Davis is a 82 y.o. female past medical history of anxiety, coronary artery disease, hypertension, hyperlipidemia, A-fib on apixaban, recent left hemispheric multifocal stenosis likely secondary to symptomatic left carotid stenosis, which was being treated during this admission with left carotid stent-currently on apixaban, aspirin and Plavix and was admitted to the ICU after stent placement. Neurological consultation was obtained because patient was confused and agitated overnight. Family reports that she has not had much sleep and has been somewhat agitated. The report that she does not have too much in terms of deficits after her last stroke and has been making good recovery but her memory definitely has issues at baseline. Nursing staff reports that she got really confused overnight, started hallucinating and started mistaking pieces of furniture in the room for people. She required sitter overnight. She was given Ativan. When the consult was requested, I recommended we get an MRI given the fact that she recently had a stroke with some concern for expansion of the stroke during that same admission as well as recent carotid revascularization to evaluate for new strokes.   ROS: Full ROS was performed and is negative except as noted in the HPI.   Past Medical History:  Diagnosis Date   Anxiety    a.) on BZO (alprazolam) PRN   Arthritis    fingers, right hip   Atrial fibrillation (HCC) 11/10/2013   a.) CHA2DS2VASc = 5 (age x2, sex, HTN, T2DM);  b.) rate/rhythm maintained on oral metoprolol tartrate; chronically anticoagulated with apixaban   Bronchitis    Cerebrovascular small vessel disease (chronic)    Coronary artery disease involving native coronary artery of  native heart 01/11/2021   a.) LHC 01/11/2021: 40% oLM, 50% mLCx, 30% oLCx, 30% pLAD, 30% mLAD, 99% pRCA (2.75 x 30 mm Resolute Onyx DES), 75% p-mRCA, 60% dRCA   DDD (degenerative disc disease), lumbar 12/01/2013   Diastolic dysfunction 01/12/2021   a.) TTE 01/12/2021 (s/p inf STEMI): EF 55-60%, mild-mod LAE, mild RAE, mod RVE, mild AR/MR, mod TR, G1DD   Diet-controlled type 2 diabetes mellitus (HCC)    Full dentures    Gout    History of bilateral cataract extraction 2021   Hyperlipidemia    Hypertension, essential    Long term current use of anticoagulant    a.) apixaban   PONV (postoperative nausea and vomiting)    Sinoatrial node dysfunction (HCC) 11/10/2013   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) 01/11/2021   a.) LHC/PCI 01/11/2021 --> culprit lesion 99% pRCA --> 2.75 x 30 mm Resolute Onyx DES x 1   Varicose veins of legs    Wears hearing aid in both ears    Family History  Problem Relation Age of Onset   Heart attack Mother 9       required open heart surgery. Onset age is unknown.   Stroke Father    Diabetes Father    Alcoholism Brother    Breast cancer Neg Hx    Social History:   reports that she quit smoking about 44 years ago. Her smoking use included cigarettes. She has never used smokeless tobacco. She reports that she does not currently use alcohol. She reports that she does not use drugs.  Medications  Current Facility-Administered Medications:    0.9 %  sodium chloride  infusion, , Intravenous, Continuous, Dew, Marlow Baars, MD, Stopped at 02/26/23 0357   0.9 %  sodium chloride infusion, 250 mL, Intravenous, Continuous, Drusilla Kanner, RPH   acetaminophen (TYLENOL) tablet 325-650 mg, 325-650 mg, Oral, Q4H PRN, 650 mg at 02/26/23 2124 **OR** acetaminophen (TYLENOL) suppository 325-650 mg, 325-650 mg, Rectal, Q4H PRN, Wyn Quaker, Marlow Baars, MD   allopurinol (ZYLOPRIM) tablet 300 mg, 300 mg, Oral, Daily, Dew, Marlow Baars, MD, 300 mg at 02/27/23 1027    ALPRAZolam (XANAX) tablet 0.25 mg, 0.25 mg, Oral, BID PRN, Annice Needy, MD, 0.25 mg at 02/26/23 1342   alum & mag hydroxide-simeth (MAALOX/MYLANTA) 200-200-20 MG/5ML suspension 15-30 mL, 15-30 mL, Oral, Q2H PRN, Wyn Quaker, Marlow Baars, MD   apixaban (ELIQUIS) tablet 5 mg, 5 mg, Oral, BID, Dew, Marlow Baars, MD, 5 mg at 02/27/23 1027   aspirin EC tablet 81 mg, 81 mg, Oral, Daily, Dew, Marlow Baars, MD, 81 mg at 02/27/23 1026   atorvastatin (LIPITOR) tablet 80 mg, 80 mg, Oral, Daily, Dew, Marlow Baars, MD, 80 mg at 02/27/23 1025   Chlorhexidine Gluconate Cloth 2 % PADS 6 each, 6 each, Topical, Daily, Annice Needy, MD, 6 each at 02/27/23 1028   citalopram (CELEXA) tablet 20 mg, 20 mg, Oral, Daily, Dew, Marlow Baars, MD, 20 mg at 02/27/23 1027   clopidogrel (PLAVIX) tablet 75 mg, 75 mg, Oral, Daily, Dew, Marlow Baars, MD, 75 mg at 02/27/23 1026   famotidine (PEPCID) tablet 20 mg, 20 mg, Oral, BID, Tressie Ellis, RPH, 20 mg at 02/27/23 1027   guaiFENesin-dextromethorphan (ROBITUSSIN DM) 100-10 MG/5ML syrup 15 mL, 15 mL, Oral, Q4H PRN, Dew, Marlow Baars, MD   hydrALAZINE (APRESOLINE) injection 5 mg, 5 mg, Intravenous, Q20 Min PRN, Dew, Marlow Baars, MD   labetalol (NORMODYNE) injection 10 mg, 10 mg, Intravenous, Q10 min PRN, Dew, Marlow Baars, MD   magnesium sulfate IVPB 2 g 50 mL, 2 g, Intravenous, Daily PRN, Wyn Quaker, Marlow Baars, MD   melatonin tablet 5 mg, 5 mg, Oral, QHS PRN, Annice Needy, MD, 5 mg at 02/26/23 2022   metoprolol tartrate (LOPRESSOR) injection 2-5 mg, 2-5 mg, Intravenous, Q2H PRN, Wyn Quaker, Marlow Baars, MD   metoprolol tartrate (LOPRESSOR) tablet 25 mg, 25 mg, Oral, BID, Dew, Marlow Baars, MD, 25 mg at 02/27/23 1027   morphine (PF) 4 MG/ML injection 2-5 mg, 2-5 mg, Intravenous, Q1H PRN, Wyn Quaker, Marlow Baars, MD   ondansetron (ZOFRAN) injection 4 mg, 4 mg, Intravenous, Q6H PRN, Dew, Marlow Baars, MD   phenol (CHLORASEPTIC) mouth spray 1 spray, 1 spray, Mouth/Throat, PRN, Dew, Marlow Baars, MD   phenylephrine (NEO-SYNEPHRINE) 20mg /NS premix infusion, 25-200  mcg/min, Intravenous, Titrated, Mitchell, Devan, RPH   polyethylene glycol (MIRALAX / GLYCOLAX) packet 17 g, 17 g, Oral, Daily PRN, Dew, Marlow Baars, MD   potassium chloride SA (KLOR-CON M) CR tablet 10 mEq, 10 mEq, Oral, Daily, Dew, Marlow Baars, MD, 10 mEq at 02/27/23 1026   potassium chloride SA (KLOR-CON M) CR tablet 20-40 mEq, 20-40 mEq, Oral, Daily PRN, Annice Needy, MD   torsemide (DEMADEX) tablet 20 mg, 20 mg, Oral, Daily, Dew, Marlow Baars, MD, 20 mg at 02/27/23 1028   traMADol (ULTRAM) tablet 100 mg, 100 mg, Oral, Q8H PRN, Annice Needy, MD, 100 mg at 02/25/23 2022  Exam: Current vital signs: BP (!) 154/94   Pulse 68   Temp 98 F (36.7 C) (Oral)   Resp 19   Ht 5\' 2"  (1.575 m)   Wt 85.9 kg  SpO2 100%   BMI 34.64 kg/m  Vital signs in last 24 hours: Temp:  [97.2 F (36.2 C)-98 F (36.7 C)] 98 F (36.7 C) (08/10 0300) Pulse Rate:  [56-128] 68 (08/10 1315) Resp:  [15-30] 19 (08/10 1315) BP: (87-173)/(62-94) 154/94 (08/10 1315) SpO2:  [87 %-100 %] 100 % (08/10 1315) Awake alert no distress HEENT: Normocephalic atraumatic Chest:Clear Cardiovascular: Regular rhythm Neurological exam Awake alert oriented x 3 No aphasia No dysarthria Somewhat diminished attention concentration Cranial nerves II to XII intact Motor examination with no drift Sensation intact light touch Coordination exam with no dysmetria NIH stroke scale-0   Labs I have reviewed labs in epic and the results pertinent to this consultation are: CBC    Component Value Date/Time   WBC 6.6 02/26/2023 0331   RBC 2.24 (L) 02/26/2023 0331   HGB 9.3 (L) 02/26/2023 1924   HCT 28.7 (L) 02/26/2023 1924   PLT 262 02/26/2023 0331   MCV 96.4 02/26/2023 0331   MCH 29.9 02/26/2023 0331   MCHC 31.0 02/26/2023 0331   RDW 15.3 02/26/2023 0331   LYMPHSABS 1.2 02/03/2023 0612   MONOABS 0.6 02/03/2023 0612   EOSABS 0.1 02/03/2023 0612   BASOSABS 0.0 02/03/2023 0612    CMP     Component Value Date/Time   NA 141 02/26/2023  0331   K 3.5 02/26/2023 0331   CL 109 02/26/2023 0331   CO2 26 02/26/2023 0331   GLUCOSE 172 (H) 02/26/2023 0331   BUN 23 02/26/2023 0331   CREATININE 1.16 (H) 02/26/2023 0331   CALCIUM 8.5 (L) 02/26/2023 0331   PROT 5.8 (L) 02/03/2023 0612   ALBUMIN 2.2 (L) 02/03/2023 0612   AST 21 02/03/2023 0612   ALT 25 02/03/2023 0612   ALKPHOS 93 02/03/2023 0612   BILITOT 0.6 02/03/2023 0612   GFRNONAA 48 (L) 02/26/2023 0331    Lipid Panel     Component Value Date/Time   CHOL 86 01/28/2023 0400   TRIG 55 01/28/2023 0400   HDL 29 (L) 01/28/2023 0400   CHOLHDL 3.0 01/28/2023 0400   VLDL 11 01/28/2023 0400   LDLCALC 46 01/28/2023 0400    Lab Results  Component Value Date   HGBA1C 6.1 (H) 01/05/2023     Stroke workup has recently been completed-please see progress notes and consult notes from July 2024 from neurology.  Imaging I have reviewed the images obtained:  MRI of the brain shows expected evolution of the corona radiata and parietal lobe infarcts with punctate acute infarcts in the left temporal and left lateral temporal lobe.  Extensive white matter disease. Likely periprocedural strokes  Assessment:  82 year old past history of anxiety, coronary disease, hypertension hyperlipidemia A-fib on apixaban, recent left hemispheric multifocal strokes likely secondary to symptomatic left carotid stenosis which was treated via left carotid stent and then she was admitted to ICU and was noted to be agitated and confused overnight. I suspect sundowning in the setting of underlying cognitive decline to be the reason for her confusion. Repeat MRI was done which shows 2 acute punctate strokes in the left temporal lobes which are likely periprocedural and likely have no bearing on her current presentation.  Impression: ICU delirium New punctate strokes in the left temporal lobe after left carotid revascularization-likely periprocedural.  Recommendations: No further stroke workup at this  time Antiplatelets and anticoagulants per vascular surgery I would recommend minimizing Ativan and using Seroquel as needed if she is agitated at night. Redirect and delirium precautions. Follow-up with outpatient neurology  in the next 4 to 6 weeks. Vascular neurology follow-up per vascular neurology. Plan was relayed to Dr. Evie Lacks   -- Milon Dikes, MD Neurologist [facility] Pager: [phone]

## 2023-02-27 NOTE — Plan of Care (Signed)
Discussed with patient in front of family plan of care for the evening, pain managament and new bedtime medications with some teach back displayed.  What is important to the patient is patience if she has a confusion episode tonight and wants bedtime medications earlier around 2030 to 2100 tonight.  Problem: Education: Goal: Knowledge of disease or condition will improve Outcome: Progressing   Problem: Health Behavior/Discharge Planning: Goal: Ability to manage health-related needs will improve Outcome: Progressing   Problem: Health Behavior/Discharge Planning: Goal: Goals will be collaboratively established with patient/family Outcome: Not Progressing   Problem: Pain Managment: Goal: General experience of comfort will improve Outcome: Not Progressing

## 2023-02-27 NOTE — Progress Notes (Signed)
Patient order for 1:1 sitter and Ativan 1 time dose obtained.  Patient is calmer and reoriented at this time.  Patient finally able to rest with eyes closed at this time.

## 2023-02-27 NOTE — Progress Notes (Signed)
PT Cancellation Note  Patient Details Name: Brooke Davis MRN: [id number] DOB: 03/23/1941   Cancelled Treatment:    Reason Eval/Treat Not Completed: Fatigue/lethargy limiting ability to participate Spoke with RN who reports patient is very sleepy right now. Was up earlier. Will re-attempt later as time allows.   , 02/27/2023, 1:28 PM

## 2023-02-27 NOTE — Progress Notes (Signed)
Pt transported to and from MRI via bed and accompanied by this RN with cardiac monitoring; no complications or issues. Resting comfortably in bed upon return to room.

## 2023-02-27 NOTE — Progress Notes (Signed)
2 Days Post-Op   Subjective/Chief Complaint: Confusion/agitation improved this morning. Ativan given and sitter provided last night. Able to sleep. Without complaint this morning   Objective: Vital signs in last 24 hours: Temp:  [97.2 F (36.2 C)-98 F (36.7 C)] 98 F (36.7 C) (08/10 0300) Pulse Rate:  [56-128] 71 (08/10 0830) Resp:  [15-30] 21 (08/10 0830) BP: (87-167)/(46-112) 165/94 (08/10 0830) SpO2:  [87 %-100 %] 92 % (08/10 0830) Last BM Date : 02/26/23  Intake/Output from previous day: 08/09 0701 - 08/10 0700 In: 1626.8 [P.O.:740; I.V.:0.8; Blood:886] Out: 1450 [Urine:1450] Intake/Output this shift: Total I/O In: -  Out: 325 [Urine:325]  General appearance: alert and no distress Resp: clear to auscultation bilaterally Cardio: regular rate and rhythm Extremities: extremities normal, atraumatic, no cyanosis or edema Neurologic: Mental status: Alert, oriented, thought content appropriate, alertness: alert Motor/sensory grossly intact. Some aphasia- unchanged Lab Results:  Recent Labs    02/26/23 0331 02/26/23 1924  WBC 6.6  --   HGB 6.7* 9.3*  HCT 21.6* 28.7*  PLT 262  --    BMET Recent Labs    02/25/23 0847 02/26/23 0331  NA  --  141  K  --  3.5  CL  --  109  CO2  --  26  GLUCOSE  --  172*  BUN 26* 23  CREATININE 1.22* 1.16*  CALCIUM  --  8.5*   PT/INR No results for input(s): "LABPROT", "INR" in the last 72 hours. ABG No results for input(s): "PHART", "HCO3" in the last 72 hours.  Invalid input(s): "PCO2", "PO2"  Studies/Results: No results found.  Anti-infectives: Anti-infectives (From admission, onward)    Start     Dose/Rate Route Frequency Ordered Stop   02/25/23 1730  ceFAZolin (ANCEF) IVPB 2g/100 mL premix        2 g 200 mL/hr over 30 Minutes Intravenous Every 8 hours 02/25/23 1027 02/26/23 0139   02/25/23 0846  ceFAZolin (ANCEF) IVPB 2g/100 mL premix        2 g 200 mL/hr over 30 Minutes Intravenous 30 min pre-op 02/25/23 0846  02/25/23 0950       Assessment/Plan: s/p Procedure(s): CAROTID PTA/STENT INTERVENTION (Left) Continue to monitor confusion Transfer to telemetry with sitter today OOB with PT/OT Encourage Incentive spirometry HgB stable- monitor labs ASA/Plavix/Eliquis daily Discussed with daughters at bedside  LOS: 2 days    Eli Hose A 02/27/2023

## 2023-02-28 MED ORDER — SODIUM CHLORIDE 0.9% FLUSH
3.0000 mL | Freq: Two times a day (BID) | INTRAVENOUS | Status: DC
  Administered 2023-02-28 – 2023-03-01 (×2): 3 mL via INTRAVENOUS

## 2023-02-28 NOTE — Evaluation (Signed)
Physical Therapy Evaluation Patient Details Name: Brooke Davis MRN: [id number] DOB: May 01, 1941 Today's Date: 02/28/2023  History of Present Illness  Patient admitted for follow up of her carotid disease.  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  As part of her workup, she underwent a CT angiogram of the neck which I have independently reviewed. 8/8- S/P ultrasound guidance for vascular access right femoral artery,   Placement of a 9 mm proximal, 7 mm distal 4 cm long Exact stent with the use of the NAV-6 embolic protection device in the left carotid artery   Clinical Impression  Patient received in recliner. Family at bedside. She is agreeable to PT assessment and requests to get back into bed at end of session. Patient needs cga for sit to stand transfer and ambulation with RW of 30 feet. She required min A to return to supine and for positioning in bed. She will continue to benefit from skilled PT to improve strength, activity tolerance and safety with mobility.             If plan is discharge home, recommend the following: A little help with walking and/or transfers;A little help with bathing/dressing/bathroom;Assist for transportation;Help with stairs or ramp for entrance;Assistance with cooking/housework   Can travel by private vehicle        Equipment Recommendations None recommended by PT  Recommendations for Other Services       Functional Status Assessment Patient has had a recent decline in their functional status and demonstrates the ability to make significant improvements in function in a reasonable and predictable amount of time.     Precautions / Restrictions Precautions Precautions: Posterior Hip Precaution Booklet Issued: No Restrictions Weight Bearing Restrictions: No RLE Weight Bearing: Weight bearing as tolerated LLE Weight Bearing: Weight bearing as  tolerated Other Position/Activity Restrictions: RLE WBAT      Mobility  Bed Mobility Overal bed mobility: Needs Assistance Bed Mobility: Sit to Supine       Sit to supine: Min assist   General bed mobility comments: min A to lift LEs back into bed    Transfers Overall transfer level: Needs assistance Equipment used: Rolling walker (2 wheels) Transfers: Sit to/from Stand Sit to Stand: Contact guard assist                Ambulation/Gait Ambulation/Gait assistance: Contact guard assist Gait Distance (Feet): 30 Feet Assistive device: Rolling walker (2 wheels) Gait Pattern/deviations: Step-through pattern, Decreased step length - right, Decreased step length - left, Decreased stride length Gait velocity: decreased     General Gait Details: no unsteadiness with ambulation using RW, limited by fatigue  Stairs            Wheelchair Mobility     Tilt Bed    Modified Rankin (Stroke Patients Only)       Balance Overall balance assessment: Needs assistance Sitting-balance support: Feet supported Sitting balance-Leahy Scale: Good     Standing balance support: Bilateral upper extremity supported, During functional activity, Reliant on assistive device for balance Standing balance-Leahy Scale: Good                               Pertinent Vitals/Pain Pain Assessment Pain Assessment: No/denies pain    Home Living Family/patient expects to be discharged to:: Private residence Living Arrangements: Other relatives (lives with  grandson who works during the day, has other family who can assist as needed) Available Help at Discharge: Family;Available 24 hours/day Type of Home: House Home Access: Ramped entrance       Home Layout: One level Home Equipment: Toilet riser;BSC/3in1;Rollator (4 wheels);Wheelchair - Forensic psychologist (2 wheels);Grab bars - toilet;Grab bars - tub/shower;Shower seat;Other (comment)      Prior Function Prior Level  of Function : Needs assist             Mobility Comments: MOD I with rollator ADLs Comments: assist for IADLs from family due to recent hip surgery; generally MOD I for ADLs, assist for LB dressing after hip surgery     Extremity/Trunk Assessment   Upper Extremity Assessment Upper Extremity Assessment: Overall WFL for tasks assessed    Lower Extremity Assessment Lower Extremity Assessment: Overall WFL for tasks assessed    Cervical / Trunk Assessment Cervical / Trunk Assessment: Normal  Communication   Communication Communication: No apparent difficulties Cueing Techniques: Verbal cues  Cognition Arousal: Alert Behavior During Therapy: WFL for tasks assessed/performed Overall Cognitive Status: Within Functional Limits for tasks assessed                                          General Comments      Exercises     Assessment/Plan    PT Assessment Patient needs continued PT services  PT Problem List Decreased strength;Decreased range of motion;Decreased activity tolerance;Decreased balance;Decreased mobility       PT Treatment Interventions DME instruction;Gait training;Stair training;Functional mobility training;Therapeutic activities;Therapeutic exercise;Patient/family education    PT Goals (Current goals can be found in the Care Plan section)  Acute Rehab PT Goals Patient Stated Goal: to return home, get stronger PT Goal Formulation: With patient/family Time For Goal Achievement: 03/14/23 Potential to Achieve Goals: Good    Frequency Min 1X/week     Co-evaluation               AM-PAC PT "6 Clicks" Mobility  Outcome Measure Help needed turning from your back to your side while in a flat bed without using bedrails?: A Little Help needed moving from lying on your back to sitting on the side of a flat bed without using bedrails?: A Little Help needed moving to and from a bed to a chair (including a wheelchair)?: A Little Help needed  standing up from a chair using your arms (e.g., wheelchair or bedside chair)?: A Little Help needed to walk in hospital room?: A Little Help needed climbing 3-5 steps with a railing? : A Lot 6 Click Score: 17    End of Session Equipment Utilized During Treatment: Gait belt Activity Tolerance: Patient limited by fatigue Patient left: in bed;with call bell/phone within reach;with family/visitor present Nurse Communication: Mobility status PT Visit Diagnosis: Other abnormalities of gait and mobility (R26.89);Muscle weakness (generalized) (M62.81);Difficulty in walking, not elsewhere classified (R26.2)    Time: 3086-5784 PT Time Calculation (min) (ACUTE ONLY): 12 min   Charges:   PT Evaluation $PT Eval Moderate Complexity: 1 Mod   PT General Charges $$ ACUTE PT VISIT: 1 Visit          , PT, GCS 02/28/23,11:42 AM

## 2023-02-28 NOTE — TOC Initial Note (Signed)
Transition of Care [facility]) - Initial/Assessment Note    Patient Details  Name: Brooke Davis MRN: [id number] Date of Birth: May 23, 1941  Transition of Care [facility]) CM/SW Contact:    Colette Ribas, LCSWA Phone Number: 02/28/2023, 10:27 AM  Clinical Narrative:                  PT under 30day readmit. HRA previosu completed &goes as followed:   Transition of Care [facility]) - Initial/Assessment Note      Patient Details  Name: Brooke Davis MRN: [id number] Date of Birth: [birth year]   Transition of Care [facility]) CM/SW Contact:    Allena Katz, LCSW Phone Number: 01/28/2023, 11:18 AM   Clinical Narrative:  Pt admitted with embolic stroke. Pt not fully oriented. PT/OT has been ordered for patient and the recommendation is for SNF. CSW will reach out to family to discuss.                      Patient Goals and CMS Choice        Expected Discharge Plan and Services                         Prior Living Arrangements/Services          Activities of Daily Living Home Assistive Devices/Equipment: Environmental consultant (specify type), Wheelchair, Blood pressure cuff, Hearing aid, Eyeglasses, Bedside commode/3-in-1, Shower chair without back, Dentures (specify type), Other (Comment) (pulse ox) ADL Screening (condition at time of admission) Patient's cognitive ability adequate to safely complete daily activities?: Yes Is the patient deaf or have difficulty hearing?: Yes Does the patient have difficulty seeing, even when wearing glasses/contacts?: No Does the patient have difficulty concentrating, remembering, or making decisions?: No Patient able to express need for assistance with ADLs?: Yes Does the patient have difficulty dressing or bathing?: Yes Independently performs ADLs?: Yes (appropriate for developmental age) Does the patient have difficulty walking or climbing stairs?: Yes Weakness of Legs: Both Weakness of Arms/Hands: None   Permission Sought/Granted                                                            Emotional Assessment   Admission diagnosis:  TIA (transient ischemic attack) [G45.9] Right arm weakness [R29.898]     Patient Active Problem List    Diagnosis Date Noted   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   Chronic atrial fibrillation with RVR (HCC) 01/28/2023   Dyslipidemia 01/28/2023   Chronic diastolic CHF (congestive heart failure) (HCC) 01/28/2023   Gout 01/28/2023   Anxiety and depression 01/28/2023   Right arm weakness 01/28/2023   Hx of total hip arthroplasty, right 01/18/2023   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   Slurred speech 09/23/2021   STEMI involving oth coronary artery of inferior wall (HCC) 01/11/2021   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) 01/11/2021   Controlled type 2 diabetes mellitus with hyperglycemia, without long-term current use of insulin (HCC) 01/11/2021   Pure hypercholesterolemia 01/11/2021   Hypercalcemia 01/11/2021   DDD (degenerative disc disease), lumbar 12/01/2013   Lumbar radiculitis 12/01/2013   Atrial fibrillation, transient (HCC) 11/10/2013   Sinoatrial node dysfunction (HCC) 11/10/2013   Primary hypertension 11/10/2013   Poorly-controlled hypertension  11/10/2013    PCP:  [name], MD Pharmacy:   [facility] [phone] [place], [place] - [address] AT [facility] [address] [place] [address] Phone: [phone] Fax: [phone]         Social Determinants of Health (SDOH) Social History: SDOH Screenings       Food Insecurity: No Food Insecurity (01/28/2023)  Housing: Low Risk  (01/28/2023)  Transportation Needs: No Transportation Needs (01/28/2023)  Utilities: Not At Risk (01/28/2023)  Financial Resource Strain: Patient Declined (11/05/2022)    Received from [facility]  Tobacco Use: Medium Risk (01/28/2023)

## 2023-02-28 NOTE — Progress Notes (Signed)
Report called to [facility], care nurse Melissa.

## 2023-02-28 NOTE — Progress Notes (Signed)
3 Days Post-Op   Subjective/Chief Complaint: Doing much better this morning. Up with PT ambulating with walker. No further confusion last night. On room air. Denies shortness of breath   Objective: Vital signs in last 24 hours: Temp:  [97.9 F (36.6 C)-99.7 F (37.6 C)] 97.9 F (36.6 C) (08/11 0759) Pulse Rate:  [54-142] 61 (08/11 0759) Resp:  [15-33] 23 (08/11 0759) BP: (115-159)/(58-95) 154/80 (08/11 0759) SpO2:  [83 %-100 %] 96 % (08/11 0759) Last BM Date : 02/27/23  Intake/Output from previous day: 08/10 0701 - 08/11 0700 In: 850 [P.O.:850] Out: 1325 [Urine:1325] Intake/Output this shift: No intake/output data recorded.  General appearance: alert and no distress Resp: clear to auscultation bilaterally Cardio: regular rate and rhythm Neurologic: Grossly normal  Lab Results:  Recent Labs    02/26/23 0331 02/26/23 1924  WBC 6.6  --   HGB 6.7* 9.3*  HCT 21.6* 28.7*  PLT 262  --    BMET Recent Labs    02/26/23 0331  NA 141  K 3.5  CL 109  CO2 26  GLUCOSE 172*  BUN 23  CREATININE 1.16*  CALCIUM 8.5*   PT/INR No results for input(s): "LABPROT", "INR" in the last 72 hours. ABG No results for input(s): "PHART", "HCO3" in the last 72 hours.  Invalid input(s): "PCO2", "PO2"  Studies/Results: DG Chest Port 1 View  Result Date: 02/27/2023 CLINICAL DATA:  Shortness of breath EXAM: PORTABLE CHEST 1 VIEW COMPARISON:  02/02/2023 FINDINGS: Cardiomegaly with suspected mild perihilar edema. Small bilateral pleural effusions, right greater than left. No pneumothorax. IMPRESSION: Cardiomegaly with suspected mild perihilar edema. Small bilateral pleural effusions, right greater than left. Electronically Signed   By: Charline Bills M.D.   On: 02/27/2023 19:33   MR BRAIN WO CONTRAST  Result Date: 02/27/2023 CLINICAL DATA:  Stroke, follow-up. Confusion following placement of carotid artery stent. EXAM: MRI HEAD WITHOUT CONTRAST TECHNIQUE: Multiplanar, multiecho pulse  sequences of the brain and surrounding structures were obtained without intravenous contrast. COMPARISON:  MRI of the head without contrast 01/29/2023 FINDINGS: Brain: Expected evolution of the left corona radiata infarct and left parietal lobe infarct is noted. A punctate acute infarct in the posterior left frontal lobe best visualized on axial diffusion image 30 of series 5 and coronal diffusion image 16 of series 13. Punctate lateral temporal infarct on image 14 of series 5 is new. No acute hemorrhage or mass lesion is present. Ventricles are of normal size. Extensive white matter changes are otherwise stable. Deep brain nuclei are within normal limits. Diffuse white matter changes extend into the brainstem. The cerebellum is unremarkable. Vascular: Flow is present in the major intracranial arteries. Skull and upper cervical spine: The craniocervical junction is normal. Upper cervical spine is within normal limits. Marrow signal is unremarkable. Sinuses/Orbits: The paranasal sinuses and mastoid air cells are clear. Bilateral lens replacements are noted. Globes and orbits are otherwise unremarkable. IMPRESSION: 1. Expected evolution of the left corona radiata infarct and left parietal lobe infarct. 2. New punctate acute infarcts of the left temporal lobe and lateral left temporal lobe 3. Extensive white matter disease likely reflects the sequela of chronic microvascular ischemia. Electronically Signed   By: Marin Roberts M.D.   On: 02/27/2023 12:57    Anti-infectives: Anti-infectives (From admission, onward)    Start     Dose/Rate Route Frequency Ordered Stop   02/25/23 1730  ceFAZolin (ANCEF) IVPB 2g/100 mL premix        2 g 200 mL/hr over 30  Minutes Intravenous Every 8 hours 02/25/23 1027 02/26/23 0139   02/25/23 0846  ceFAZolin (ANCEF) IVPB 2g/100 mL premix        2 g 200 mL/hr over 30 Minutes Intravenous 30 min pre-op 02/25/23 0846 02/25/23 0950       Assessment/Plan: s/p  Procedure(s): CAROTID PTA/STENT INTERVENTION (Left) Transfer to telemetry today OOB with PT- may need rehab as she is mostly at home alone Seroquel prn in the evenings for confusion or agitation Continue ASA/Plavix/Eliquis  LOS: 3 days    Eli Hose A 02/28/2023

## 2023-02-28 NOTE — Progress Notes (Signed)
Patient up to bedside commode and bath given.  Patient requested pure wick be removed and has pull-ups from home on at this time.   She would like to at the minimum get up for lunch an dinner in the chair.  Breakfast as well if possible.  Please, if transferred to floor today patient wants them to know to give her Seroquel and Tylenol at bedtime and hold melatonin (all as needed medications).  She likes lights not glaring her her eyes at all times.  And when waking her gently let her know your in the room, no sharp lights and wait til she orients herself.   She is hard of hearing and wears hearing aids.

## 2023-03-01 LAB — CBC
HCT: 27.1 % — ABNORMAL LOW (ref 36.0–46.0)
Hemoglobin: 8.6 g/dL — ABNORMAL LOW (ref 12.0–15.0)
MCH: 29.7 pg (ref 26.0–34.0)
MCHC: 31.7 g/dL (ref 30.0–36.0)
MCV: 93.4 fL (ref 80.0–100.0)
Platelets: 258 10*3/uL (ref 150–400)
RBC: 2.9 MIL/uL — ABNORMAL LOW (ref 3.87–5.11)
RDW: 15.2 % (ref 11.5–15.5)
WBC: 8 10*3/uL (ref 4.0–10.5)
nRBC: 0.4 % — ABNORMAL HIGH (ref 0.0–0.2)

## 2023-03-01 LAB — COMPREHENSIVE METABOLIC PANEL
ALT: 8 U/L (ref 0–44)
AST: 13 U/L — ABNORMAL LOW (ref 15–41)
Albumin: 2.5 g/dL — ABNORMAL LOW (ref 3.5–5.0)
Alkaline Phosphatase: 89 U/L (ref 38–126)
Anion gap: 9 (ref 5–15)
BUN: 20 mg/dL (ref 8–23)
CO2: 26 mmol/L (ref 22–32)
Calcium: 8.7 mg/dL — ABNORMAL LOW (ref 8.9–10.3)
Chloride: 105 mmol/L (ref 98–111)
Creatinine, Ser: 1.05 mg/dL — ABNORMAL HIGH (ref 0.44–1.00)
GFR, Estimated: 54 mL/min — ABNORMAL LOW (ref >60)
Glucose, Bld: 110 mg/dL — ABNORMAL HIGH (ref 70–99)
Potassium: 3.5 mmol/L (ref 3.5–5.1)
Sodium: 140 mmol/L (ref 135–145)
Total Bilirubin: 0.8 mg/dL (ref 0.3–1.2)
Total Protein: 6 g/dL — ABNORMAL LOW (ref 6.5–8.1)

## 2023-03-01 MED ORDER — SODIUM CHLORIDE 0.9 % IV SOLN: INTRAVENOUS | Status: DC | PRN

## 2023-03-01 MED ORDER — ASPIRIN 81 MG PO TBEC: 81.0000 mg | DELAYED_RELEASE_TABLET | Freq: Every day | ORAL | 12 refills | Status: DC

## 2023-03-01 MED ORDER — ATORVASTATIN CALCIUM 80 MG PO TABS: 80.0000 mg | ORAL_TABLET | Freq: Every day | ORAL | 11 refills | Status: AC

## 2023-03-01 NOTE — TOC Initial Note (Signed)
Transition of Care [facility]) - Initial/Assessment Note    Patient Details  Name: Brooke Davis MRN: [id number] Date of Birth: 30-May-1941  Transition of Care [facility]) CM/SW Contact:    Margarito Liner, LCSW Phone Number: 03/01/2023, 10:25 AM  Clinical Narrative:   Readmission prevention screen complete. CSW met with patient. Daughter, [name] at bedside. CSW introduced role and explained that discharge planning would be discussed. PCP is [name], MD. [name] or another daughter transports her to appointments. Pharmacy is [facility] on the corner of [address]. No issues obtaining medications. Patient lives home with her grandson and his three children. She was active with [facility] prior to admission for PT and OT. They can add an aide. Patient has walkers, BSC, shower seat, elevated toilet seat, wheelchair, transport chair, ramp, lift chair, and adjustable bed at home. No further concerns. CSW encouraged patient and her daughter to contact CSW as needed. CSW will continue to follow patient and her daughter for support and facilitate return home once stable. Vascular is considering discharge today. Daughter wants to see how well she walks today before deciding. Daughter drives a large SUV so she is reaching out to other family to see if they would be able to transport her. Also discussed potential EMS transport which would be private pay or door-to-door transport.               Expected Discharge Plan: Home w Home Health Services Barriers to Discharge: Other (must enter comment) (Daughter concerned about patient's ability to walk today.)   Patient Goals and CMS Choice     Choice offered to / list presented to : Patient, Adult Children      Expected Discharge Plan and Services     Post Acute Care Choice: Resumption of Svcs/PTA Provider Living arrangements for the past 2 months: Single Family Home                           HH Arranged: PT, OT,  Nurse's Aide HH Agency: [facility] Date [facility] Agency Contacted: 03/01/23   Representative spoke with at [facility] Agency: Gearldine Bienenstock  Prior Living Arrangements/Services Living arrangements for the past 2 months: Single Family Home Lives with:: Relatives Patient language and need for interpreter reviewed:: Yes Do you feel safe going back to the place where you live?: Yes      Need for Family Participation in Patient Care: Yes (Comment) Care giver support system in place?: Yes (comment) Current home services: DME, Home OT, Home PT Criminal Activity/Legal Involvement Pertinent to Current Situation/Hospitalization: No - Comment as needed  Activities of Daily Living Home Assistive Devices/Equipment: Cane (specify quad or straight), Eyeglasses, Wheelchair, Environmental consultant (specify type) ADL Screening (condition at time of admission) Patient's cognitive ability adequate to safely complete daily activities?: Yes Is the patient deaf or have difficulty hearing?: No Does the patient have difficulty seeing, even when wearing glasses/contacts?: No Does the patient have difficulty concentrating, remembering, or making decisions?: No Patient able to express need for assistance with ADLs?: Yes Does the patient have difficulty dressing or bathing?: No Independently performs ADLs?: Yes (appropriate for developmental age) Does the patient have difficulty walking or climbing stairs?: No Weakness of Legs: None Weakness of Arms/Hands: None  Permission Sought/Granted Permission sought to share information with : Facility Medical sales representative, Family Supports Permission granted to share information with : Yes, Verbal Permission Granted  Share Information with NAME: [name]  Permission  granted to share info w AGENCY: [facility]  Permission granted to share info w Relationship: Daughter  Permission granted to share info w Contact Information: [phone]  Emotional Assessment Appearance::  Appears stated age Attitude/Demeanor/Rapport: Engaged, Gracious Affect (typically observed): Accepting, Appropriate, Calm, Pleasant Orientation: : Oriented to Self, Oriented to Place, Oriented to  Time, Oriented to Situation Alcohol / Substance Use: Not Applicable Psych Involvement: No (comment)  Admission diagnosis:  Carotid stenosis, symptomatic, with infarction [facility]) [I63.239] Patient Active Problem List   Diagnosis Date Noted   Carotid stenosis, symptomatic, with infarction (HCC) 02/25/2023   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   Chronic atrial fibrillation with RVR (HCC) 01/28/2023   Dyslipidemia 01/28/2023   Chronic diastolic CHF (congestive heart failure) (HCC) 01/28/2023   Gout 01/28/2023   Anxiety and depression 01/28/2023   Right arm weakness 01/28/2023   Acute CVA (cerebrovascular accident) (HCC) 01/28/2023   Bilateral carotid artery stenosis 01/28/2023   Hx of total hip arthroplasty, right 01/18/2023   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   Slurred speech 09/23/2021   STEMI involving oth coronary artery of inferior wall (HCC) 01/11/2021   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) 01/11/2021   Controlled type 2 diabetes mellitus with hyperglycemia, without long-term current use of insulin (HCC) 01/11/2021   Pure hypercholesterolemia 01/11/2021   Hypercalcemia 01/11/2021   DDD (degenerative disc disease), lumbar 12/01/2013   Lumbar radiculitis 12/01/2013   Atrial fibrillation, transient (HCC) 11/10/2013   Sinoatrial node dysfunction (HCC) 11/10/2013   Primary hypertension 11/10/2013   Poorly-controlled hypertension 11/10/2013   PCP:  [name], MD Pharmacy:   [id number] [place], [place] - [address] AT [facility] [address] [place] [address] [place] [address] Phone: [phone] Fax: [phone]     Social  Determinants of Health (SDOH) Social History: SDOH Screenings   Food Insecurity: No Food Insecurity (02/26/2023)  Housing: Low Risk  (02/26/2023)  Transportation Needs: No Transportation Needs (02/26/2023)  Utilities: Not At Risk (02/26/2023)  Financial Resource Strain: Patient Declined (11/05/2022)   Received from [facility], [facility]  Tobacco Use: Medium Risk (02/25/2023)   SDOH Interventions:     Readmission Risk Interventions    03/01/2023   10:20 AM  Readmission Risk Prevention Plan  Transportation Screening Complete  PCP or Specialist Appt within 3-5 Days Complete  HRI or Home Care Consult Complete  Social Work Consult for Recovery Care Planning/Counseling Complete  Palliative Care Screening Not Applicable  Medication Review [facility]) Complete

## 2023-03-01 NOTE — Care Management Important Message (Signed)
Important Message  Patient Details  Name: Brooke Davis MRN: [id number] Date of Birth: 01-Jun-1941   Medicare Important Message Given:  Yes     Johnell Comings 03/01/2023, 10:58 AM

## 2023-03-01 NOTE — Discharge Instructions (Addendum)
No heavy lifting. Do not lift more than a gallon of Milk.   You may shower when you get home.   Take all medications as prescribed.   Follow up with Vascular Surgery as scheduled.       These are agencies NOT affiliated with [facility] in any way. These are all private pay, not covered by insurance. The cost will vary depending on level of care needed. This list is used as a resource only, not a recommendation.  Personal Care Service and placement assistance agency [facility]: [phone] Does not require a contract or minimum number of hours  Personal Care Service and placement assistance agency [facility]: [phone] Does have a minimum number of hours required  Personal Care Service [facility]: [phone] Does have a mimum number of hours required

## 2023-03-01 NOTE — Progress Notes (Signed)
Progress Note    03/01/2023 9:27 AM 4 Days Post-Op  Subjective:  Brooke Davis is an 82 yo female who is now POD#4 from Endovascular left carotid stent placement. She has a history of CVA from 2 weeks prior. Patients right groin incision she endorses is much less painful. Nursing reports patient slept well overnight.   On exam patient rests comfortably in bed. Patients daughter is at the bedside. She answers all my questions appropriately. She acknowledges she is in the hospital at [facility]. Patients HGB remains stable this morning at 8.6/27.1. Patient appears to be at her baseline neurologically post her CVA. Nursing reports her aphagia appears to be better. Daughter endorses patient much clearer neurologically this morning after a good nights rest.    Vitals:   03/01/23 0415 03/01/23 0817  BP: (!) 140/56 (!) 157/84  Pulse: 68 74  Resp: 18 18  Temp: 98.9 F (37.2 C) 98.3 F (36.8 C)  SpO2: 91% 93%   Physical Exam: Cardiac:  RRR, Normal S1, S2. No murmurs Lungs:  Clear throughout on auscultation, normal respiratory effort. No rales, Rhonchi or wheezing.  Incisions:  Right groin incision with dressing clean dry and intact.  Extremities:  Palpable pulses throughout Abdomen:  Positive bowel sounds, soft, non tender and non distended.  Neurologic: AAOX3 and answers all questions appropriately.   CBC    Component Value Date/Time   WBC 8.0 03/01/2023 0646   RBC 2.90 (L) 03/01/2023 0646   HGB 8.6 (L) 03/01/2023 0646   HCT 27.1 (L) 03/01/2023 0646   PLT 258 03/01/2023 0646   MCV 93.4 03/01/2023 0646   MCH 29.7 03/01/2023 0646   MCHC 31.7 03/01/2023 0646   RDW 15.2 03/01/2023 0646   LYMPHSABS 1.2 02/03/2023 0612   MONOABS 0.6 02/03/2023 0612   EOSABS 0.1 02/03/2023 0612   BASOSABS 0.0 02/03/2023 0612    BMET    Component Value Date/Time   NA 140 03/01/2023 0646   K 3.5 03/01/2023 0646   CL 105 03/01/2023 0646   CO2 26 03/01/2023 0646   GLUCOSE 110 (H) 03/01/2023 0646    BUN 20 03/01/2023 0646   CREATININE 1.05 (H) 03/01/2023 0646   CALCIUM 8.7 (L) 03/01/2023 0646   GFRNONAA 54 (L) 03/01/2023 0646    INR    Component Value Date/Time   INR 1.4 (H) 07/14/2021 1917     Intake/Output Summary (Last 24 hours) at 03/01/2023 9562 Last data filed at 02/28/2023 1356 Gross per 24 hour  Intake 120 ml  Output 250 ml  Net -130 ml     Assessment/Plan:  82 y.o. female is s/p Endovascular left carotid stent placement.  4 Days Post-Op   PLAN: Patient is ready for discharge today or tomorrow.  I had a long discussion with the patient's daughter and the patient and the wishes not to go to rehab.  Daughter wishes to reinstitute home health physical therapy but was not happy with the previous company.  I reached out to case management to let them know.  They will have a conversation with the patient and her daughter today to determine the plan going forward.  Daughter wishes home therapy to be set up before being discharged.  If we can get that set up today I can discharge the patient this afternoon if not discharged sometime tomorrow morning.  Patient to be discharged on ASA 81 mg daily, Plavix 75 mg Daily, Eliquis 5 mg daily and Lipitor 80 mg Daily.   DVT prophylaxis:  Patient  to be discharged on ASA 81 mg daily, Plavix 75 mg Daily, Eliquis 5 mg daily and Lipitor 80 mg Daily.   I discussed the plan in detail with Dr. Festus Barren MD and he is in agreement with the plan.  Marcie Bal [facility] 03/01/2023 9:27 AM

## 2023-03-01 NOTE — Discharge Summary (Cosign Needed Addendum)
[facility]    Discharge Summary    Patient ID:  Brooke Davis MRN: [id number] DOB/AGE: [birth year] 82 y.o.  Admit date: 02/25/2023 Discharge date: 03/01/2023 Date of Surgery: 02/25/2023 Surgeon: Surgeon(s): Wyn Quaker Marlow Baars, MD  Admission Diagnosis: Carotid stenosis, symptomatic, with infarction [facility]) [I63.239]  Discharge Diagnoses:  Carotid stenosis, symptomatic, with infarction [facility]) [I63.239]  Secondary Diagnoses: Past Medical History:  Diagnosis Date   Anxiety    a.) on BZO (alprazolam) PRN   Arthritis    fingers, right hip   Atrial fibrillation (HCC) 11/10/2013   a.) CHA2DS2VASc = 5 (age x2, sex, HTN, T2DM);  b.) rate/rhythm maintained on oral metoprolol tartrate; chronically anticoagulated with apixaban   Bronchitis    Cerebrovascular small vessel disease (chronic)    Coronary artery disease involving native coronary artery of native heart 01/11/2021   a.) LHC 01/11/2021: 40% oLM, 50% mLCx, 30% oLCx, 30% pLAD, 30% mLAD, 99% pRCA (2.75 x 30 mm Resolute Onyx DES), 75% p-mRCA, 60% dRCA   DDD (degenerative disc disease), lumbar 12/01/2013   Diastolic dysfunction 01/12/2021   a.) TTE 01/12/2021 (s/p inf STEMI): EF 55-60%, mild-mod LAE, mild RAE, mod RVE, mild AR/MR, mod TR, G1DD   Diet-controlled type 2 diabetes mellitus (HCC)    Full dentures    Gout    History of bilateral cataract extraction 2021   Hyperlipidemia    Hypertension, essential    Long term current use of anticoagulant    a.) apixaban   PONV (postoperative nausea and vomiting)    Sinoatrial node dysfunction (HCC) 11/10/2013   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 Female no  POD# 4 from Left carotid Endovascular Stent placement.  Patient is recovering as expected.  Patient continues to be at her baseline neurologically from her CVA 2 weeks ago.  Patient is ambulating well with a walker.  Patient is eating and drinking and using the bathroom appropriately.  Patient's prior aphasia has gotten better.  Patient is set up to go home today with home health nursing physical therapy and an aide.  Patient's vitals all remained stable.  Hospital Course:  Brooke Davis is a 82 y.o. female is S/P Left Extubated: POD # 0 Physical Exam:  Alert notes x3, no acute distress Face: Symmetrical.  Tongue is midline. Neck: Trachea is midline.  No swelling or bruising. Cardiovascular: Regular rate and rhythm Pulmonary: Clear to auscultation bilaterally Abdomen: Soft, nontender, nondistended Right groin access: Clean dry and intact.  No swelling or drainage noted Left lower extremity: Thigh soft.  Calf soft.  Extremities warm distally toes.  Hard to palpate pedal pulses however the foot is warm is her good capillary refill. Right lower extremity: Thigh soft.  Calf soft.  Extremities warm distally toes.  Hard to palpate pedal pulses however the foot is warm is her good capillary refill. Neurological: No deficits noted   Post-op wounds:  clean, dry, intact or healing well  Pt. Ambulating, voiding and taking PO diet without difficulty. Pt pain controlled with PO pain meds.  Labs:  As below  Complications: none  Consults:    Significant Diagnostic Studies: CBC Lab Results  Component Value Date   WBC 8.0 03/01/2023   HGB 8.6 (L) 03/01/2023   HCT 27.1 (L) 03/01/2023   MCV 93.4 03/01/2023   PLT 258 03/01/2023    BMET    Component Value Date/Time   NA 140 03/01/2023 0646   K 3.5 03/01/2023 0646   CL 105 03/01/2023 0646   CO2 26 03/01/2023 0646   GLUCOSE 110 (H) 03/01/2023 0646   BUN 20 03/01/2023 0646   CREATININE 1.05 (H) 03/01/2023 0646   CALCIUM 8.7 (L)  03/01/2023 0646   GFRNONAA 54 (L) 03/01/2023 0646   COAG Lab Results  Component Value Date   INR 1.4 (H) 07/14/2021   INR 1.0 01/11/2021     Disposition:  Discharge to :Home  Patient to discharged home on ASA 81 mg Daily, Plavix 75 mg Daily, Eliquis 5 mg twice daily, Lipitor 80 mg Daily.   Allergies as of 03/01/2023       Reactions   Celebrex [celecoxib] Diarrhea   Penicillins Itching   IgE = 154 (WNL) on 01/05/2023   Relafen [nabumetone]    Palpitation   Lovastatin Diarrhea        Medication List     TAKE these medications    acetaminophen 325 MG tablet Commonly known as: TYLENOL Take 2 tablets (650 mg total) by mouth every 6 (six) hours as needed for mild pain (or Fever >/= 101).   allopurinol 300 MG tablet Commonly known as: ZYLOPRIM Take 1 tablet (300 mg total) by mouth daily.   ALPRAZolam 0.25 MG tablet Commonly known as: XANAX Take 0.25 mg by mouth 2 (two) times daily as needed for anxiety.   apixaban 5 MG Tabs tablet Commonly known as: ELIQUIS Take 1 tablet (5 mg total) by mouth 2 (two) times daily.   aspirin EC 81 MG tablet Take 1 tablet (81 mg total) by mouth daily. Swallow whole. Start taking on: March 02, 2023   atorvastatin 80 MG tablet Commonly known as: LIPITOR Take 1 tablet (80 mg total) by mouth daily.   citalopram 20 MG tablet Commonly known as: CELEXA Take 1 tablet (20 mg total) by mouth daily.   clonazePAM 0.5 MG tablet Commonly known as: KLONOPIN Take 1 tablet (0.5 mg total) by mouth 3 (three) times daily as needed (anxiety).   clopidogrel 75 MG tablet Commonly known as: PLAVIX Take 1 tablet (75 mg total) by mouth daily.   Entresto 24-26 MG Generic drug: sacubitril-valsartan Take 1 tablet by mouth 2 (two) times daily.   melatonin 5 MG Tabs Take 1 tablet (5 mg total) by mouth at bedtime.   metoprolol tartrate 25 MG tablet Commonly known as: LOPRESSOR Take 1 tablet (25 mg total) by mouth 2 (two) times daily.   oxyCODONE  5 MG immediate release tablet Commonly known as: Oxy IR/ROXICODONE Take 1 tablet (5 mg total) by mouth every 4 (four) hours as needed for moderate pain (pain score 4-6).   potassium chloride 10 MEQ tablet Commonly known as: KLOR-CON Take 1 tablet (10 mEq total) by mouth daily.   torsemide 20 MG tablet Commonly known as: DEMADEX Take 1 tablet (20 mg total) by mouth daily.   traMADol 50 MG tablet Commonly known as: ULTRAM Take 100 mg by mouth every 8 (eight) hours as needed for moderate pain.       Verbal and written Discharge instructions given to the patient. Wound care per Discharge AVS  Follow-up Information     Georgiana Spinner, NP Follow up in 1  month(s).   Specialty: Vascular Surgery Why: Bilateral Carotid Ultrasound Contact information: [address] [place] [address] [phone]                 Signed: Marcie Bal, NP  03/01/2023, 2:02 PM

## 2023-03-01 NOTE — Progress Notes (Signed)
Physical Therapy Treatment Patient Details Name: RAYVON ESPELAND MRN: [id number] DOB: [birth year] Today's Date: 03/01/2023   History of Present Illness Patient admitted for follow up of her carotid disease.  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  As part of her workup, she underwent a CT angiogram of the neck which I have independently reviewed. 8/8- S/P ultrasound guidance for vascular access right femoral artery,   Placement of a 9 mm proximal, 7 mm distal 4 cm long Exact stent with the use of the NAV-6 embolic protection device in the left carotid artery    PT Comments  Pt with min a x 1.  Stands and walks 54' with RW and contact guard with daughter following with recliner.  After return to room she is able to walk to/from bathroom to void with contact guard/min a x 1 to navigate.    Pt and daughter both state pt is not at baseline with mobility and activity tolerance.  She does have good family support at home and pt feels better going home vs SNF.  They are encouraged to have +24 hour support at home initially until she progresses towards baseline.  They do seem a bit overwhelmed with care and will benefit from as many [facility] services as available to her.     If plan is discharge home, recommend the following: A little help with walking and/or transfers;A little help with bathing/dressing/bathroom;Assist for transportation;Help with stairs or ramp for entrance;Assistance with cooking/housework   Can travel by private vehicle        Equipment Recommendations  None recommended by PT    Recommendations for Other Services       Precautions / Restrictions Precautions Precautions: Posterior Hip Precaution Booklet Issued: No Restrictions Weight Bearing Restrictions: No Other Position/Activity Restrictions: RLE WBAT     Mobility  Bed Mobility Overal bed mobility: Needs Assistance Bed  Mobility: Supine to Sit       Sit to supine: Min assist     Patient Response: Cooperative  Transfers Overall transfer level: Needs assistance Equipment used: Rolling walker (2 wheels) Transfers: Sit to/from Stand Sit to Stand: Contact guard assist, Min assist                Ambulation/Gait Ambulation/Gait assistance: Contact guard assist, Min assist Gait Distance (Feet): 90 Feet Assistive device: Rolling walker (2 wheels) Gait Pattern/deviations: Step-through pattern, Decreased step length - right, Decreased step length - left, Decreased stride length Gait velocity: decreased     General Gait Details: no unsteadiness with ambulation using RW, limited by fatigue   Stairs             Wheelchair Mobility     Tilt Bed Tilt Bed Patient Response: Cooperative  Modified Rankin (Stroke Patients Only)       Balance Overall balance assessment: Needs assistance Sitting-balance support: Feet supported Sitting balance-Leahy Scale: Good     Standing balance support: Bilateral upper extremity supported, During functional activity, Reliant on assistive device for balance Standing balance-Leahy Scale: Fair Standing balance comment: reports feelings of post lean but no intervention needed                            Cognition Arousal: Alert Behavior During Therapy: WFL for tasks assessed/performed Overall Cognitive Status: Within Functional Limits for tasks assessed  Exercises Other Exercises Other Exercises: to bathroom to void    General Comments        Pertinent Vitals/Pain Pain Assessment Pain Assessment: Faces Faces Pain Scale: Hurts a little bit Pain Location: B knees that improves with gait Pain Descriptors / Indicators: Sore Pain Intervention(s): Limited activity within patient's tolerance, Monitored during session, Repositioned    Home Living                           Prior Function            PT Goals (current goals can now be found in the care plan section) Progress towards PT goals: Progressing toward goals    Frequency    Min 1X/week      PT Plan      Co-evaluation              AM-PAC PT "6 Clicks" Mobility   Outcome Measure  Help needed turning from your back to your side while in a flat bed without using bedrails?: A Little Help needed moving from lying on your back to sitting on the side of a flat bed without using bedrails?: A Little Help needed moving to and from a bed to a chair (including a wheelchair)?: A Little Help needed standing up from a chair using your arms (e.g., wheelchair or bedside chair)?: A Little Help needed to walk in hospital room?: A Little Help needed climbing 3-5 steps with a railing? : A Lot 6 Click Score: 17    End of Session Equipment Utilized During Treatment: Gait belt Activity Tolerance: Patient limited by fatigue Patient left: in bed;with call bell/phone within reach;with family/visitor present Nurse Communication: Mobility status PT Visit Diagnosis: Other abnormalities of gait and mobility (R26.89);Muscle weakness (generalized) (M62.81);Difficulty in walking, not elsewhere classified (R26.2)     Time: 1610-9604 PT Time Calculation (min) (ACUTE ONLY): 47 min  Charges:    $Gait Training: 8-22 mins $Therapeutic Activity: 23-37 mins PT General Charges $$ ACUTE PT VISIT: 1 Visit                   Danielle Dess, PTA 03/01/23, 1:31 PM

## 2023-03-01 NOTE — TOC Transition Note (Signed)
Transition of Care [facility]) - CM/SW Discharge Note   Patient Details  Name: Brooke Davis MRN: [id number] Date of Birth: 12/19/1940  Transition of Care [facility]) CM/SW Contact:  Margarito Liner, LCSW Phone Number: 03/01/2023, 2:45 PM   Clinical Narrative: Patient has orders to discharge home today. [facility] liaison is aware. No further concerns. CSW signing off.    Final next level of care: Home w Home Health Services Barriers to Discharge: Barriers Resolved   Patient Goals and CMS Choice   Choice offered to / list presented to : Patient, Adult Children  Discharge Placement                  Patient to be transferred to facility by: Family Name of family member notified: [name] Patient and family notified of of transfer: 03/01/23  Discharge Plan and Services Additional resources added to the After Visit Summary for       Post Acute Care Choice: Resumption of Svcs/PTA Provider                    HH Arranged: RN, PT, OT, Nurse's Aide HH Agency: [facility] Date [facility] Agency Contacted: 03/01/23   Representative spoke with at [facility] Agency: Gearldine Bienenstock  Social Determinants of Health (SDOH) Interventions SDOH Screenings   Food Insecurity: No Food Insecurity (02/26/2023)  Housing: Low Risk  (02/26/2023)  Transportation Needs: No Transportation Needs (02/26/2023)  Utilities: Not At Risk (02/26/2023)  Financial Resource Strain: Patient Declined (11/05/2022)   Received from [facility], [facility]  Tobacco Use: Medium Risk (02/25/2023)     Readmission Risk Interventions    03/01/2023   10:20 AM  Readmission Risk Prevention Plan  Transportation Screening Complete  PCP or Specialist Appt within 3-5 Days Complete  HRI or Home Care Consult Complete  Social Work Consult for Recovery Care Planning/Counseling Complete  Palliative Care Screening Not Applicable  Medication Review [employer]) Complete

## 2023-03-01 NOTE — Plan of Care (Signed)

## 2023-03-11 ENCOUNTER — Ambulatory Visit (INDEPENDENT_AMBULATORY_CARE_PROVIDER_SITE_OTHER): Payer: Medicare Other | Admitting: Nurse Practitioner

## 2023-03-18 ENCOUNTER — Other Ambulatory Visit: Payer: Self-pay

## 2023-03-18 ENCOUNTER — Emergency Department: Payer: Medicare Other

## 2023-03-18 ENCOUNTER — Inpatient Hospital Stay
Admission: EM | Admit: 2023-03-18 | Discharge: 2023-04-02 | DRG: 466 | Disposition: A | Discharge status: Home Health | Payer: Medicare Other | Attending: Internal Medicine | Admitting: Internal Medicine

## 2023-03-18 DIAGNOSIS — W19XXXA Unspecified fall, initial encounter: Secondary | ICD-10-CM | POA: Diagnosis present

## 2023-03-18 DIAGNOSIS — Z955 Presence of coronary angioplasty implant and graft: Secondary | ICD-10-CM

## 2023-03-18 DIAGNOSIS — Z880 Allergy status to penicillin: Secondary | ICD-10-CM

## 2023-03-18 DIAGNOSIS — Z95828 Presence of other vascular implants and grafts: Secondary | ICD-10-CM | POA: Diagnosis not present

## 2023-03-18 DIAGNOSIS — L03115 Cellulitis of right lower limb: Secondary | ICD-10-CM | POA: Insufficient documentation

## 2023-03-18 DIAGNOSIS — B9689 Other specified bacterial agents as the cause of diseases classified elsewhere: Secondary | ICD-10-CM | POA: Diagnosis present

## 2023-03-18 DIAGNOSIS — Z9842 Cataract extraction status, left eye: Secondary | ICD-10-CM

## 2023-03-18 DIAGNOSIS — M96840 Postprocedural hematoma of a musculoskeletal structure following a musculoskeletal system procedure: Secondary | ICD-10-CM | POA: Diagnosis not present

## 2023-03-18 DIAGNOSIS — Z87891 Personal history of nicotine dependence: Secondary | ICD-10-CM

## 2023-03-18 DIAGNOSIS — E785 Hyperlipidemia, unspecified: Secondary | ICD-10-CM | POA: Diagnosis present

## 2023-03-18 DIAGNOSIS — Y831 Surgical operation with implant of artificial internal device as the cause of abnormal reaction of the patient, or of later complication, without mention of misadventure at the time of the procedure: Secondary | ICD-10-CM | POA: Diagnosis present

## 2023-03-18 DIAGNOSIS — Z7189 Other specified counseling: Secondary | ICD-10-CM | POA: Diagnosis not present

## 2023-03-18 DIAGNOSIS — Z515 Encounter for palliative care: Secondary | ICD-10-CM

## 2023-03-18 DIAGNOSIS — I5032 Chronic diastolic (congestive) heart failure: Secondary | ICD-10-CM | POA: Diagnosis present

## 2023-03-18 DIAGNOSIS — I10 Essential (primary) hypertension: Secondary | ICD-10-CM | POA: Diagnosis present

## 2023-03-18 DIAGNOSIS — D62 Acute posthemorrhagic anemia: Secondary | ICD-10-CM | POA: Insufficient documentation

## 2023-03-18 DIAGNOSIS — D72829 Elevated white blood cell count, unspecified: Secondary | ICD-10-CM | POA: Diagnosis not present

## 2023-03-18 DIAGNOSIS — Z886 Allergy status to analgesic agent status: Secondary | ICD-10-CM

## 2023-03-18 DIAGNOSIS — R531 Weakness: Secondary | ICD-10-CM | POA: Diagnosis not present

## 2023-03-18 DIAGNOSIS — E876 Hypokalemia: Secondary | ICD-10-CM | POA: Diagnosis not present

## 2023-03-18 DIAGNOSIS — I252 Old myocardial infarction: Secondary | ICD-10-CM

## 2023-03-18 DIAGNOSIS — Z6837 Body mass index (BMI) 37.0-37.9, adult: Secondary | ICD-10-CM

## 2023-03-18 DIAGNOSIS — Z1152 Encounter for screening for COVID-19: Secondary | ICD-10-CM

## 2023-03-18 DIAGNOSIS — F32A Depression, unspecified: Secondary | ICD-10-CM | POA: Diagnosis present

## 2023-03-18 DIAGNOSIS — Z974 Presence of external hearing-aid: Secondary | ICD-10-CM

## 2023-03-18 DIAGNOSIS — Z811 Family history of alcohol abuse and dependence: Secondary | ICD-10-CM

## 2023-03-18 DIAGNOSIS — Z8673 Personal history of transient ischemic attack (TIA), and cerebral infarction without residual deficits: Secondary | ICD-10-CM

## 2023-03-18 DIAGNOSIS — Z7901 Long term (current) use of anticoagulants: Secondary | ICD-10-CM | POA: Diagnosis not present

## 2023-03-18 DIAGNOSIS — Z79899 Other long term (current) drug therapy: Secondary | ICD-10-CM | POA: Diagnosis not present

## 2023-03-18 DIAGNOSIS — D509 Iron deficiency anemia, unspecified: Secondary | ICD-10-CM | POA: Diagnosis present

## 2023-03-18 DIAGNOSIS — N1831 Chronic kidney disease, stage 3a: Secondary | ICD-10-CM | POA: Diagnosis present

## 2023-03-18 DIAGNOSIS — Z66 Do not resuscitate: Secondary | ICD-10-CM | POA: Diagnosis present

## 2023-03-18 DIAGNOSIS — I2510 Atherosclerotic heart disease of native coronary artery without angina pectoris: Secondary | ICD-10-CM | POA: Diagnosis present

## 2023-03-18 DIAGNOSIS — Z823 Family history of stroke: Secondary | ICD-10-CM

## 2023-03-18 DIAGNOSIS — Z9841 Cataract extraction status, right eye: Secondary | ICD-10-CM

## 2023-03-18 DIAGNOSIS — Z6372 Alcoholism and drug addiction in family: Secondary | ICD-10-CM

## 2023-03-18 DIAGNOSIS — T8451XA Infection and inflammatory reaction due to internal right hip prosthesis, initial encounter: Principal | ICD-10-CM | POA: Diagnosis present

## 2023-03-18 DIAGNOSIS — Z7982 Long term (current) use of aspirin: Secondary | ICD-10-CM

## 2023-03-18 DIAGNOSIS — F419 Anxiety disorder, unspecified: Secondary | ICD-10-CM | POA: Diagnosis present

## 2023-03-18 DIAGNOSIS — H9190 Unspecified hearing loss, unspecified ear: Secondary | ICD-10-CM | POA: Diagnosis present

## 2023-03-18 DIAGNOSIS — Z96651 Presence of right artificial knee joint: Secondary | ICD-10-CM | POA: Diagnosis present

## 2023-03-18 DIAGNOSIS — A419 Sepsis, unspecified organism: Secondary | ICD-10-CM | POA: Diagnosis present

## 2023-03-18 DIAGNOSIS — A498 Other bacterial infections of unspecified site: Secondary | ICD-10-CM

## 2023-03-18 DIAGNOSIS — T8450XA Infection and inflammatory reaction due to unspecified internal joint prosthesis, initial encounter: Secondary | ICD-10-CM | POA: Diagnosis not present

## 2023-03-18 DIAGNOSIS — I482 Chronic atrial fibrillation, unspecified: Secondary | ICD-10-CM | POA: Diagnosis present

## 2023-03-18 DIAGNOSIS — Z8249 Family history of ischemic heart disease and other diseases of the circulatory system: Secondary | ICD-10-CM

## 2023-03-18 DIAGNOSIS — I6523 Occlusion and stenosis of bilateral carotid arteries: Secondary | ICD-10-CM | POA: Diagnosis not present

## 2023-03-18 DIAGNOSIS — E1122 Type 2 diabetes mellitus with diabetic chronic kidney disease: Secondary | ICD-10-CM | POA: Diagnosis present

## 2023-03-18 DIAGNOSIS — Z90711 Acquired absence of uterus with remaining cervical stump: Secondary | ICD-10-CM

## 2023-03-18 DIAGNOSIS — Z888 Allergy status to other drugs, medicaments and biological substances status: Secondary | ICD-10-CM

## 2023-03-18 DIAGNOSIS — Z96641 Presence of right artificial hip joint: Secondary | ICD-10-CM

## 2023-03-18 DIAGNOSIS — I130 Hypertensive heart and chronic kidney disease with heart failure and stage 1 through stage 4 chronic kidney disease, or unspecified chronic kidney disease: Secondary | ICD-10-CM | POA: Diagnosis present

## 2023-03-18 DIAGNOSIS — Z833 Family history of diabetes mellitus: Secondary | ICD-10-CM

## 2023-03-18 DIAGNOSIS — Z7902 Long term (current) use of antithrombotics/antiplatelets: Secondary | ICD-10-CM

## 2023-03-18 LAB — CBC WITH DIFFERENTIAL/PLATELET
Abs Immature Granulocytes: 0.04 10*3/uL (ref 0.00–0.07)
Basophils Absolute: 0 10*3/uL (ref 0.0–0.1)
Basophils Relative: 0 %
Eosinophils Absolute: 0 10*3/uL (ref 0.0–0.5)
Eosinophils Relative: 0 %
HCT: 27.9 % — ABNORMAL LOW (ref 36.0–46.0)
Hemoglobin: 9.1 g/dL — ABNORMAL LOW (ref 12.0–15.0)
Immature Granulocytes: 0 %
Lymphocytes Relative: 4 %
Lymphs Abs: 0.4 10*3/uL — ABNORMAL LOW (ref 0.7–4.0)
MCH: 30 pg (ref 26.0–34.0)
MCHC: 32.6 g/dL (ref 30.0–36.0)
MCV: 92.1 fL (ref 80.0–100.0)
Monocytes Absolute: 0.5 10*3/uL (ref 0.1–1.0)
Monocytes Relative: 5 %
Neutro Abs: 8.8 10*3/uL — ABNORMAL HIGH (ref 1.7–7.7)
Neutrophils Relative %: 91 %
Platelets: 290 10*3/uL (ref 150–400)
RBC: 3.03 MIL/uL — ABNORMAL LOW (ref 3.87–5.11)
RDW: 17.1 % — ABNORMAL HIGH (ref 11.5–15.5)
WBC: 9.8 10*3/uL (ref 4.0–10.5)
nRBC: 0 % (ref 0.0–0.2)

## 2023-03-18 LAB — COMPREHENSIVE METABOLIC PANEL
ALT: 12 U/L (ref 0–44)
AST: 19 U/L (ref 15–41)
Albumin: 2.9 g/dL — ABNORMAL LOW (ref 3.5–5.0)
Alkaline Phosphatase: 85 U/L (ref 38–126)
Anion gap: 12 (ref 5–15)
BUN: 18 mg/dL (ref 8–23)
CO2: 26 mmol/L (ref 22–32)
Calcium: 8.7 mg/dL — ABNORMAL LOW (ref 8.9–10.3)
Chloride: 96 mmol/L — ABNORMAL LOW (ref 98–111)
Creatinine, Ser: 1.27 mg/dL — ABNORMAL HIGH (ref 0.44–1.00)
GFR, Estimated: 43 mL/min — ABNORMAL LOW (ref >60)
Glucose, Bld: 146 mg/dL — ABNORMAL HIGH (ref 70–99)
Potassium: 3.3 mmol/L — ABNORMAL LOW (ref 3.5–5.1)
Sodium: 134 mmol/L — ABNORMAL LOW (ref 135–145)
Total Bilirubin: 0.9 mg/dL (ref 0.3–1.2)
Total Protein: 6.5 g/dL (ref 6.5–8.1)

## 2023-03-18 LAB — APTT: aPTT: 55 seconds — ABNORMAL HIGH (ref 24–36)

## 2023-03-18 LAB — URINALYSIS, W/ REFLEX TO CULTURE (INFECTION SUSPECTED)
Bacteria, UA: NONE SEEN
Bilirubin Urine: NEGATIVE
Glucose, UA: NEGATIVE mg/dL
Ketones, ur: NEGATIVE mg/dL
Leukocytes,Ua: NEGATIVE
Nitrite: NEGATIVE
Protein, ur: NEGATIVE mg/dL
Specific Gravity, Urine: 1.005 (ref 1.005–1.030)
pH: 7 (ref 5.0–8.0)

## 2023-03-18 LAB — PROTIME-INR
INR: 2.2 — ABNORMAL HIGH (ref 0.8–1.2)
Prothrombin Time: 24.6 seconds — ABNORMAL HIGH (ref 11.4–15.2)

## 2023-03-18 LAB — LACTIC ACID, PLASMA: Lactic Acid, Venous: 1.7 mmol/L (ref 0.5–1.9)

## 2023-03-18 LAB — RESP PANEL BY RT-PCR (RSV, FLU A&B, COVID)  RVPGX2
Influenza A by PCR: NEGATIVE
Influenza B by PCR: NEGATIVE
Resp Syncytial Virus by PCR: NEGATIVE
SARS Coronavirus 2 by RT PCR: NEGATIVE

## 2023-03-18 MED ORDER — SODIUM CHLORIDE 0.9 % IV BOLUS (SEPSIS)
1000.0000 mL | Freq: Once | INTRAVENOUS | Status: AC
  Administered 2023-03-18: 1000 mL via INTRAVENOUS

## 2023-03-18 MED ORDER — METRONIDAZOLE 500 MG/100ML IV SOLN
500.0000 mg | Freq: Once | INTRAVENOUS | Status: AC
  Administered 2023-03-18: 500 mg via INTRAVENOUS
  Filled 2023-03-18: qty 100

## 2023-03-18 MED ORDER — VANCOMYCIN HCL IN DEXTROSE 1-5 GM/200ML-% IV SOLN: 1000.0000 mg | Freq: Once | INTRAVENOUS | Status: DC

## 2023-03-18 MED ORDER — SODIUM CHLORIDE 0.9 % IV SOLN
2.0000 g | Freq: Once | INTRAVENOUS | Status: AC
  Administered 2023-03-18: 2 g via INTRAVENOUS
  Filled 2023-03-18: qty 12.5

## 2023-03-18 MED ORDER — SODIUM CHLORIDE 0.9 % IV SOLN: 2.0000 g | Freq: Once | INTRAVENOUS | Status: DC

## 2023-03-18 MED ORDER — VANCOMYCIN HCL 2000 MG/400ML IV SOLN
2000.0000 mg | Freq: Once | INTRAVENOUS | Status: AC
  Administered 2023-03-19: 2000 mg via INTRAVENOUS
  Filled 2023-03-18: qty 400

## 2023-03-18 MED ORDER — LACTATED RINGERS IV SOLN: INTRAVENOUS | Status: DC

## 2023-03-18 NOTE — ED Triage Notes (Signed)
Pt had fall today after right hi[ surgery. Site is red and warm. Fever 102.6. N&V x 1 week. Weakness x1 month. Fever 102.6. tylenol at 2040 650mg . 4mg  zofran 2100. Bp 148/76 HR 90 CBG 150 SPO2 99.

## 2023-03-18 NOTE — Sepsis Progress Note (Signed)
Following for sepsis monitoring ?

## 2023-03-18 NOTE — Progress Notes (Signed)
PHARMACY -  BRIEF ANTIBIOTIC NOTE   Pharmacy has received consult(s) for Vancomycin, Aztreonam from an ED provider.  The patient's profile has been reviewed for ht/wt/allergies/indication/available labs.    One time order(s) placed for Vancomycin 2 gm IV X 1 and Cefepime 2 gm IV X 1.  Pt has PCN allergy but tolerated cefazolin previously.   Further antibiotics/pharmacy consults should be ordered by admitting physician if indicated.                       Thank you, Demya Scruggs D 03/18/2023  9:29 PM

## 2023-03-18 NOTE — ED Provider Notes (Signed)
[facility] Provider Note    Event Date/Time   First MD Initiated Contact with Patient 03/18/23 2121     (approximate)  History   Chief Complaint: Code Sepsis  HPI  Brooke Davis is a 82 y.o. female with a past medical history of atrial fibrillation, hypertension, hyperlipidemia, presents to the emergency department for fever nausea vomiting.  According to the patient for the past several days she has been nauseated now with vomiting today very weak found to be febrile per EMS to 102.  Patient states a recent right hip replacement, denies any pain in the hip.  Recently finished PT/home health.  Physical Exam   Triage Vital Signs: ED Triage Vitals [03/18/23 2122]  Encounter Vitals Group     BP      Systolic BP Percentile      Diastolic BP Percentile      Pulse      Resp      Temp      Temp src      SpO2      Weight 179 lb (81.2 kg)     Height 5\' 2"  (1.575 m)     Head Circumference      Peak Flow      Pain Score 0     Pain Loc      Pain Education      Exclude from Growth Chart     Most recent vital signs: There were no vitals filed for this visit.  General: Awake, no distress.  CV:  Good peripheral perfusion.  Regular rate and rhythm  Resp:  Normal effort.  Equal breath sounds bilaterally.  Abd:  No distention.  Soft, nontender.  No rebound or guarding.  Benign abdomen.  Occasional nausea/retching. Other:  Well-appearing right hip good range of motion nontender hip, well-appearing incisions/surgical site   ED Results / Procedures / Treatments   RADIOLOGY  I have reviewed and interpreted chest x-ray images.  Patient has possible mild consolidation in the right midlung. Radiology show the chest x-ray is negative.   MEDICATIONS ORDERED IN ED: Medications  lactated ringers infusion (has no administration in time range)  sodium chloride 0.9 % bolus 1,000 mL (has no administration in time range)  aztreonam (AZACTAM) 2 g in sodium  chloride 0.9 % 100 mL IVPB (has no administration in time range)  metroNIDAZOLE (FLAGYL) IVPB 500 mg (has no administration in time range)  vancomycin (VANCOCIN) IVPB 1000 mg/200 mL premix (has no administration in time range)     IMPRESSION / MDM / ASSESSMENT AND PLAN / ED COURSE  I reviewed the triage vital signs and the nursing notes.  Patient's presentation is most consistent with acute presentation with potential threat to life or bodily function.  Patient presents to the emergency department with nausea vomiting weakness found to be febrile per EMS with mild tachycardia concerning for sepsis.  Patient denies any cough or congestion although did cough multiple times while I was in the room.  States she has also noticed a slight smell to her urine.  We will check labs, cultures, urinalysis will obtain a COVID swab we will IV hydrate start the patient on broad-spectrum antibiotics and continue to close monitor while awaiting results.  Differential is quite broad at this time but includes infectious etiology such as COVID, pneumonia, UTI.  Less concerning for joint infection given good range of motion nontender hip with well-appearing surgical incision.  Patient's lab work has resulted showing a overall  reassuring chemistry of mild signs of dehydration.  Urinalysis shows no obvious urinary tract infection lactate of 1.7, CBC shows mild anemia but no other concerning findings normal white blood cell count.  Chest x-ray is clear.  COVID test is pending.  Given the patient's nausea vomiting weakness meeting sepsis criteria we will admit to the hospital service for continued antibiotics until the cultures grew out or COVID test results positive.  CRITICAL CARE Performed by: Minna Antis   Total critical care time: 30 minutes  Critical care time was exclusive of separately billable procedures and treating other patients.  Critical care was necessary to treat or prevent imminent or  life-threatening deterioration.  Critical care was time spent personally by me on the following activities: development of treatment plan with patient and/or surrogate as well as nursing, discussions with consultants, evaluation of patient's response to treatment, examination of patient, obtaining history from patient or surrogate, ordering and performing treatments and interventions, ordering and review of laboratory studies, ordering and review of radiographic studies, pulse oximetry and re-evaluation of patient's condition.   FINAL CLINICAL IMPRESSION(S) / ED DIAGNOSES   Sepsis Fever Nausea vomiting   Note:  This document was prepared using Dragon voice recognition software and may include unintentional dictation errors.   Minna Antis, MD 03/18/23 [phone]

## 2023-03-18 NOTE — Progress Notes (Signed)
CODE SEPSIS - PHARMACY COMMUNICATION  **Broad Spectrum Antibiotics should be administered within 1 hour of Sepsis diagnosis**  Time Code Sepsis Called/Page Received:  8/29 @ 2124  Antibiotics Ordered:   Vanc, metronidazole, cefepime   Time of 1st antibiotic administration: metronidazole 500 mg IV X 1 on 8/29 @ 2206   Additional action taken by pharmacy:   If necessary, Name of Provider/Nurse Contacted:     Margurite Duffy D ,PharmD Clinical Pharmacist  03/18/2023  10:54 PM

## 2023-03-19 DIAGNOSIS — I482 Chronic atrial fibrillation, unspecified: Secondary | ICD-10-CM

## 2023-03-19 DIAGNOSIS — F419 Anxiety disorder, unspecified: Secondary | ICD-10-CM

## 2023-03-19 DIAGNOSIS — F32A Depression, unspecified: Secondary | ICD-10-CM

## 2023-03-19 DIAGNOSIS — R531 Weakness: Secondary | ICD-10-CM

## 2023-03-19 DIAGNOSIS — Z8673 Personal history of transient ischemic attack (TIA), and cerebral infarction without residual deficits: Secondary | ICD-10-CM

## 2023-03-19 DIAGNOSIS — Z96641 Presence of right artificial hip joint: Secondary | ICD-10-CM

## 2023-03-19 DIAGNOSIS — A419 Sepsis, unspecified organism: Secondary | ICD-10-CM | POA: Diagnosis not present

## 2023-03-19 DIAGNOSIS — I10 Essential (primary) hypertension: Secondary | ICD-10-CM

## 2023-03-19 DIAGNOSIS — I2510 Atherosclerotic heart disease of native coronary artery without angina pectoris: Secondary | ICD-10-CM

## 2023-03-19 DIAGNOSIS — Z9861 Coronary angioplasty status: Secondary | ICD-10-CM

## 2023-03-19 DIAGNOSIS — L03115 Cellulitis of right lower limb: Secondary | ICD-10-CM | POA: Insufficient documentation

## 2023-03-19 DIAGNOSIS — I5032 Chronic diastolic (congestive) heart failure: Secondary | ICD-10-CM

## 2023-03-19 DIAGNOSIS — Z7901 Long term (current) use of anticoagulants: Secondary | ICD-10-CM

## 2023-03-19 LAB — MRSA NEXT GEN BY PCR, NASAL: MRSA by PCR Next Gen: NOT DETECTED

## 2023-03-19 LAB — LACTIC ACID, PLASMA: Lactic Acid, Venous: 1.2 mmol/L (ref 0.5–1.9)

## 2023-03-19 LAB — PROCALCITONIN: Procalcitonin: 0.46 ng/mL

## 2023-03-19 MED ORDER — OXYCODONE HCL 5 MG PO TABS
5.0000 mg | ORAL_TABLET | ORAL | Status: DC | PRN
  Administered 2023-03-20 – 2023-03-28 (×25): 5 mg via ORAL
  Filled 2023-03-19 (×29): qty 1

## 2023-03-19 MED ORDER — ALPRAZOLAM 0.25 MG PO TABS
0.2500 mg | ORAL_TABLET | Freq: Two times a day (BID) | ORAL | Status: DC | PRN
  Administered 2023-03-19 – 2023-03-30 (×9): 0.25 mg via ORAL
  Filled 2023-03-19 (×9): qty 1

## 2023-03-19 MED ORDER — SODIUM CHLORIDE 0.9 % IV SOLN
2.0000 g | Freq: Two times a day (BID) | INTRAVENOUS | Status: DC
  Administered 2023-03-19 – 2023-03-23 (×9): 2 g via INTRAVENOUS
  Filled 2023-03-19 (×10): qty 12.5

## 2023-03-19 MED ORDER — CLOPIDOGREL BISULFATE 75 MG PO TABS
75.0000 mg | ORAL_TABLET | Freq: Every day | ORAL | Status: DC
  Administered 2023-03-19 – 2023-04-02 (×14): 75 mg via ORAL
  Filled 2023-03-19 (×15): qty 1

## 2023-03-19 MED ORDER — LACTATED RINGERS IV SOLN: 150.0000 mL/h | INTRAVENOUS | Status: DC

## 2023-03-19 MED ORDER — MELATONIN 5 MG PO TABS: 5.0000 mg | ORAL_TABLET | Freq: Every evening | ORAL | Status: DC | PRN

## 2023-03-19 MED ORDER — VANCOMYCIN HCL IN DEXTROSE 1-5 GM/200ML-% IV SOLN
1000.0000 mg | INTRAVENOUS | Status: DC
  Administered 2023-03-21: 1000 mg via INTRAVENOUS
  Filled 2023-03-19: qty 200

## 2023-03-19 MED ORDER — ONDANSETRON HCL 4 MG PO TABS
4.0000 mg | ORAL_TABLET | Freq: Four times a day (QID) | ORAL | Status: DC | PRN
  Administered 2023-03-20: 4 mg via ORAL
  Filled 2023-03-19: qty 1

## 2023-03-19 MED ORDER — POTASSIUM CHLORIDE CRYS ER 20 MEQ PO TBCR
40.0000 meq | EXTENDED_RELEASE_TABLET | Freq: Once | ORAL | Status: AC
  Administered 2023-03-19: 40 meq via ORAL
  Filled 2023-03-19: qty 2

## 2023-03-19 MED ORDER — METRONIDAZOLE 500 MG/100ML IV SOLN
500.0000 mg | Freq: Two times a day (BID) | INTRAVENOUS | Status: DC
  Filled 2023-03-19: qty 100

## 2023-03-19 MED ORDER — POTASSIUM CHLORIDE 10 MEQ/100ML IV SOLN
10.0000 meq | INTRAVENOUS | Status: DC
  Filled 2023-03-19: qty 100

## 2023-03-19 MED ORDER — APIXABAN 5 MG PO TABS
5.0000 mg | ORAL_TABLET | Freq: Two times a day (BID) | ORAL | Status: DC
  Administered 2023-03-19 – 2023-03-27 (×16): 5 mg via ORAL
  Filled 2023-03-19 (×18): qty 1

## 2023-03-19 MED ORDER — ACETAMINOPHEN 325 MG PO TABS
650.0000 mg | ORAL_TABLET | Freq: Four times a day (QID) | ORAL | Status: DC | PRN
  Administered 2023-03-19 – 2023-03-24 (×2): 650 mg via ORAL
  Filled 2023-03-19 (×4): qty 2

## 2023-03-19 MED ORDER — METOPROLOL TARTRATE 25 MG PO TABS
25.0000 mg | ORAL_TABLET | Freq: Two times a day (BID) | ORAL | Status: DC
  Administered 2023-03-19 – 2023-03-20 (×4): 25 mg via ORAL
  Filled 2023-03-19 (×5): qty 1

## 2023-03-19 MED ORDER — ALLOPURINOL 300 MG PO TABS
300.0000 mg | ORAL_TABLET | Freq: Every day | ORAL | Status: DC
  Administered 2023-03-19 – 2023-04-02 (×14): 300 mg via ORAL
  Filled 2023-03-19 (×15): qty 1

## 2023-03-19 MED ORDER — CITALOPRAM HYDROBROMIDE 10 MG PO TABS
20.0000 mg | ORAL_TABLET | Freq: Every day | ORAL | Status: DC
  Administered 2023-03-19 – 2023-04-02 (×14): 20 mg via ORAL
  Filled 2023-03-19: qty 1
  Filled 2023-03-19 (×7): qty 2
  Filled 2023-03-19: qty 1
  Filled 2023-03-19 (×5): qty 2
  Filled 2023-03-19: qty 1
  Filled 2023-03-19 (×3): qty 2

## 2023-03-19 MED ORDER — TORSEMIDE 20 MG PO TABS
20.0000 mg | ORAL_TABLET | Freq: Every day | ORAL | Status: DC
  Administered 2023-03-19 – 2023-03-20 (×2): 20 mg via ORAL
  Filled 2023-03-19 (×2): qty 1

## 2023-03-19 MED ORDER — SACUBITRIL-VALSARTAN 24-26 MG PO TABS
1.0000 | ORAL_TABLET | Freq: Two times a day (BID) | ORAL | Status: DC
  Administered 2023-03-19 – 2023-03-20 (×4): 1 via ORAL
  Filled 2023-03-19 (×5): qty 1

## 2023-03-19 MED ORDER — ATORVASTATIN CALCIUM 20 MG PO TABS
80.0000 mg | ORAL_TABLET | Freq: Every day | ORAL | Status: DC
  Administered 2023-03-19 – 2023-04-02 (×14): 80 mg via ORAL
  Filled 2023-03-19 (×15): qty 4

## 2023-03-19 MED ORDER — ONDANSETRON HCL 4 MG/2ML IJ SOLN
4.0000 mg | Freq: Four times a day (QID) | INTRAMUSCULAR | Status: DC | PRN
  Administered 2023-04-01: 4 mg via INTRAVENOUS
  Filled 2023-03-19: qty 2

## 2023-03-19 NOTE — Assessment & Plan Note (Signed)
Continue Eliquis and statin.

## 2023-03-19 NOTE — Assessment & Plan Note (Signed)
Clinically euvolemic Continue Entresto, metoprolol and torsemide Monitor for fluid overload in view of IV fluid resuscitation in the management of suspected sepsis

## 2023-03-19 NOTE — Plan of Care (Signed)
  Problem: Elimination: Goal: Will not experience complications related to bowel motility Outcome: Progressing   Problem: Pain Managment: Goal: General experience of comfort will improve Outcome: Progressing   Problem: Skin Integrity: Goal: Risk for impaired skin integrity will decrease Outcome: Progressing   Problem: Safety: Goal: Ability to remain free from injury will improve Outcome: Progressing   Problem: Activity: Goal: Risk for activity intolerance will decrease Outcome: Progressing

## 2023-03-19 NOTE — Assessment & Plan Note (Signed)
-   Continue home meds °

## 2023-03-19 NOTE — Assessment & Plan Note (Signed)
Continue aspirin, atorvastatin and metoprolol

## 2023-03-19 NOTE — Assessment & Plan Note (Signed)
Continue citalopram and alprazolam

## 2023-03-19 NOTE — Assessment & Plan Note (Addendum)
Possible cellulitis vs infection post op hip arthroplasty Redness and warmth felt over right hip Management as outlined for sepsis Ortho consult

## 2023-03-19 NOTE — Hospital Course (Signed)
Same day note  Brooke Davis is a 82 y.o. female with medical history significant for DM, HTN, CAD s/p PCI and stent, diastolic CHF A fib on Eliquis, and anxiety hospitalized in July 2024 with an acute embolic stroke while on Eliquis, with workup showing left carotid artery stenosis s/p L carotid stent placement on 02/25/23 and underwent  total right hip replacement on 03/09/23  who presented to the hospital with 3-day history of fever nausea dizziness and diarrhea with progressive weakness and a fall at home.  Family concerned about redness and swelling around site of hip replacement.  EMS was called in and patient was noted to be tachycardic with a temperature of 102 and presented to the ED as a code sepsis.  Labs showed WBC at 9.8 and lactate of 1.7 respiratory viral panel negative for COVID flu and RSV.  Hemoglobin was 9.1 at baseline.  Urinalysis was unremarkable.  Chest x-ray was negative for infiltrate.  Patient was given vancomycin metronidazole Flagyl and was considered for admission to the hospital for possible sepsis.    Patient seen and examined at bedside.  Patient was admitted to the hospital for possible sepsis.  At the time of my evaluation, patient complains of  Physical examination reveals  Laboratory data and imaging was reviewed  Assessment and Plan. Sepsis (HCC) Was febrile and tachycardic on presentation with possibility of postop infection related to her right hip replacement.  Continue vancomycin and cefepime.  Procalcitonin at 0.4.  Follow-up blood cultures.  Orthopedics notified for assessment.  Will DC IV fluids at this time.   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  Continue antibiotics with vancomycin and cefepime.  Orthopedic consultation.   Generalized weakness, debility Secondary to acute illness.  PT evaluation has been ordered.   History of embolic stroke  07/6107 s/p L carotid stent placement 02/2023 Continue Eliquis and statin   Chronic diastolic CHF (congestive heart failure) (HCC) Compensated at this time.  Continue Entresto, metoprolol and torsemide.  Closely monitor intake and output charting Daily weights   Atrial fibrillation, chronic (HCC) Chronic anticoagulation Rate controlled at this time.  Continue Eliquis and metoprolol.   CAD S/P percutaneous coronary angioplasty Continue aspirin, atorvastatin and metoprolol   Anxiety and depression Continue citalopram and alprazolam   Primary hypertension On Entresto metoprolol and torsemide     No Charge  Signed,  Tenny Craw, MD [facility]

## 2023-03-19 NOTE — Assessment & Plan Note (Addendum)
Chronic anticoagulation Currently rate controlled Continue Eliquis and metoprolol

## 2023-03-19 NOTE — Progress Notes (Signed)
Pharmacy Antibiotic Note  Brooke Davis is a 82 y.o. female admitted on 03/18/2023 with sepsis.  Pharmacy has been consulted for Vanc, cefepime dosing.  Plan: Cefepime 2 gm IV X 1 given in ED on 8/29 @ 2341. Cefepime 2 gm IV Q12H ordered to start on 8/30 @ 1130.  Vancomycin 2 gm IV X 1 given in ED on 8/30 @ 0013. Vancomycin 1 gm IV Q48H ordered to start on 9/1 @ 0000.  AUC = 446.3 Vanc trough = 9.3  Height: 5\' 2"  (157.5 cm) Weight: 81.2 kg (179 lb) IBW/kg (Calculated) : 50.1  Temp (24hrs), Avg:99.3 F (37.4 C), Min:98.6 F (37 C), Max:99.9 F (37.7 C)  Recent Labs  Lab 03/18/23 2134 03/18/23 2216  WBC 9.8  --   CREATININE  --  1.27*  LATICACIDVEN 1.7  --     Estimated Creatinine Clearance: 34.9 mL/min (A) (by C-G formula based on SCr of 1.27 mg/dL (H)).    Allergies  Allergen Reactions   Celebrex [Celecoxib] Diarrhea   Penicillins Itching    IgE = 154 (WNL) on 01/05/2023   Relafen [Nabumetone]     Palpitation    Lovastatin Diarrhea    Antimicrobials this admission:   >>    >>   Dose adjustments this admission:   Microbiology results:  BCx:   UCx:    Sputum:    MRSA PCR:   Thank you for allowing pharmacy to be a part of this patient's care.  Brooke Davis D 03/19/2023 1:43 AM

## 2023-03-19 NOTE — Progress Notes (Signed)
Same day note  Brooke Davis is a 82 y.o. female with medical history significant for DM, HTN, CAD s/p PCI and stent, diastolic CHF A fib on Eliquis, and anxiety hospitalized in July 2024 with an acute embolic stroke while on Eliquis, with workup showing left carotid artery stenosis s/p L carotid stent placement on 02/25/23 and underwent  total right hip replacement on 03/09/23  who presented to the hospital with 3-day history of fever nausea dizziness and diarrhea with progressive weakness and a fall at home.  Family concerned about redness and swelling around site of hip replacement.  EMS was called in and patient was noted to be tachycardic with a temperature of 102 and presented to the ED as a code sepsis.  Labs showed WBC at 9.8 and lactate of 1.7 respiratory viral panel negative for COVID flu and RSV.  Hemoglobin was 9.1 at baseline.  Urinalysis was unremarkable.  Chest x-ray was negative for infiltrate.  Patient was given vancomycin metronidazole Flagyl and was considered for admission to the hospital for possible sepsis.    Patient seen and examined at bedside.  Patient was admitted to the hospital for possible sepsis.  Family reports symptoms are worsening after removal of staple on 03/09/2023 by Dr. Ernest Pine.  At the time of my evaluation, patient complains of feeling little better.  Was able to ambulate some with slight decrease in the redness and swelling of the hip.  Physical examination reveals elderly female, not in obvious distress.  Patient's daughter at bedside.  Laboratory data and imaging was reviewed  Assessment and Plan.  Sepsis (HCC) Was febrile and tachycardic on presentation with possibility of cellulitis at the operative site related to her right hip replacement.  Continue vancomycin and cefepime.  Procalcitonin at 0.4.  Follow-up blood cultures.  Orthopedics on-call Dr Clemencia Course notified for assessment.  Will DC IV fluids at this time.   Hx of total hip arthroplasty, right  03/09/2023 Possible cellulitis vs infection post op hip arthroplasty Redness and warmth felt over right hip.  Continue antibiotics with vancomycin and cefepime.  Orthopedics notified for assessment.  Patient had hip surgery done by Dr. Ernest Pine and her last follow-up was on 03/09/2023.   Generalized weakness, debility Secondary to acute illness.  PT evaluation has been ordered.  Did ambulate some in the room.   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  Continue Entresto, metoprolol and torsemide.  Closely monitor intake and output charting Daily weights   Atrial fibrillation, chronic (HCC) Chronic anticoagulation Rate controlled at this time.  Continue Eliquis and metoprolol.   CAD S/P percutaneous coronary angioplasty Continue aspirin, atorvastatin and metoprolol   Anxiety and depression Continue citalopram and alprazolam   Primary hypertension On Entresto metoprolol and torsemide    Spoke with the patient's daughter at bedside.  No Charge  Signed,  Tenny Craw, MD [facility]

## 2023-03-19 NOTE — Assessment & Plan Note (Addendum)
Sepsis criteria included fever and tachycardia.  WBC normal, lactic acid normal Suspecting postop infection related to right hip replacement  - Follow cultures - Will continue vancomycin and cefepime - Get procalcitonin - Continue sepsis fluids --Follow blood cultures given recent carotid stent placement -Ortho consult to take a look at the hip

## 2023-03-19 NOTE — Assessment & Plan Note (Signed)
Secondary to acute illness PT evaluation

## 2023-03-19 NOTE — H&P (Addendum)
History and Physical    Patient: Brooke Davis:[id number] DOB: Dec 12, 1940 DOA: 03/18/2023 DOS: the patient was seen and examined on 03/19/2023 PCP: [name], MD  Patient coming from: Home  Chief Complaint:  Chief Complaint  Patient presents with   Code Sepsis    HPI: [name] is a 82 y.o. female with medical history significant for DM, HTN, CAD s/p PCI and stent, dCHF A fib on Eliquis, and anxiety hospitalized in July 2024 with an acute embolic stroke while on Eliquis, with workup showing left carotid artery stenosis s/p L carotid stent placement on 02/25/23 and undergoing  total right hip replacement a couple weeks later on 03/09/23  who presents to the ED with a 3-day history of fever, nausea, dizziness and diarrhea.  Patient had been feeling progressively weak and on the night of arrival when she tried to get into bed she fell, breaking her bedside table.  Daughter, who gives most of the history is concerned about an area of redness and  swelling around the site of the hip replacement.  She states that patient did not want to come to the hospital however after the fall, they called EMS.  EMS found her to be tachycardic at 125 with temp of 102 and she arrived as a code sepsis. ED course and data review: On arrival, temp 99.9 with pulse 94 otherwise normal vitals Labs with normal WBC of 9.8 and lactic acid 1.7.  Respiratory viral panel negative for COVID flu and RSV.  Hemoglobin 9.1 which is above baseline.  CMP notable for minor electrolyte abnormalities and creatinine at baseline.  Urinalysis unremarkable. EKG, unavailable for review. Chest x-ray clear Patient treated with vancomycin metronidazole and Flagyl for sepsis of unknown source. Hospitalist consulted for admission for sepsis of unknown source     Past Medical History:  Diagnosis Date   Anxiety    a.) on BZO (alprazolam) PRN   Arthritis    fingers, right hip   Atrial fibrillation (HCC) 11/10/2013   a.)  CHA2DS2VASc = 5 (age x2, sex, HTN, T2DM);  b.) rate/rhythm maintained on oral metoprolol tartrate; chronically anticoagulated with apixaban   Bronchitis    Cerebrovascular small vessel disease (chronic)    Coronary artery disease involving native coronary artery of native heart 01/11/2021   a.) LHC 01/11/2021: 40% oLM, 50% mLCx, 30% oLCx, 30% pLAD, 30% mLAD, 99% pRCA (2.75 x 30 mm Resolute Onyx DES), 75% p-mRCA, 60% dRCA   DDD (degenerative disc disease), lumbar 12/01/2013   Diastolic dysfunction 01/12/2021   a.) TTE 01/12/2021 (s/p inf STEMI): EF 55-60%, mild-mod LAE, mild RAE, mod RVE, mild AR/MR, mod TR, G1DD   Diet-controlled type 2 diabetes mellitus (HCC)    Full dentures    Gout    History of bilateral cataract extraction 2021   Hyperlipidemia    Hypertension, essential    Long term current use of anticoagulant    a.) apixaban   PONV (postoperative nausea and vomiting)    Sinoatrial node dysfunction (HCC) 11/10/2013   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  All OK.  CAROTID PTA/STENT INTERVENTION Left 02/25/2023   Procedure: CAROTID PTA/STENT INTERVENTION;  Surgeon: Annice Needy, MD;  Location: [facility];  Service: Cardiovascular;  Laterality: Left;   CATARACT EXTRACTION W/PHACO Left 03/27/2020   Procedure: CATARACT EXTRACTION PHACO AND INTRAOCULAR LENS PLACEMENT (IOC) LEFT 7.49  00:56.4  13.3%;  Surgeon: Lockie Mola, MD;  Location: [facility];  Service: Ophthalmology;  Laterality: Left;   CATARACT EXTRACTION W/PHACO Right 04/24/2020   Procedure: CATARACT EXTRACTION PHACO AND INTRAOCULAR LENS PLACEMENT (IOC) RIGHT;  Surgeon:  Lockie Mola, MD;  Location: [facility];  Service: Ophthalmology;  Laterality: Right;  8.34 0:50.2 16.6%   CORONARY/GRAFT ACUTE MI REVASCULARIZATION N/A 01/11/2021   Procedure: Coronary/Graft Acute MI Revascularization;  Surgeon: Marcina Millard, MD;  Location: [facility];  Service: Cardiovascular;  Laterality: N/A;   LEFT HEART CATH AND CORONARY ANGIOGRAPHY N/A 01/11/2021   Procedure: LEFT HEART CATH AND CORONARY ANGIOGRAPHY;  Surgeon: Marcina Millard, MD;  Location: [facility];  Service: Cardiovascular;  Laterality: N/A;   PARTIAL HYSTERECTOMY     TOE SURGERY Left    great toe; rod in place   TOTAL HIP ARTHROPLASTY Right 01/18/2023   Procedure: TOTAL HIP ARTHROPLASTY;  Surgeon: Donato Heinz, MD;  Location: [facility];  Service: Orthopedics;  Laterality: Right;   TOTAL KNEE ARTHROPLASTY Right 2007   Social History:  reports that she quit smoking about 44 years ago. Her smoking use included cigarettes. She has never used smokeless tobacco. She reports that she does not currently use alcohol. She reports that she does not use drugs.  Allergies  Allergen Reactions   Celebrex [Celecoxib] Diarrhea   Penicillins Itching    IgE = 154 (WNL) on 01/05/2023   Relafen [Nabumetone]     Palpitation    Lovastatin Diarrhea    Family History  Problem Relation Age of Onset   Heart attack Mother 53       required open heart surgery. Onset age is unknown.   Stroke Father    Diabetes Father    Alcoholism Brother    Breast cancer Neg Hx     Prior to Admission medications   Medication Sig Start Date End Date Taking? Authorizing Provider  acetaminophen (TYLENOL) 325 MG tablet Take 2 tablets (650 mg total) by mouth every 6 (six) hours as needed for mild pain (or Fever >/= 101). 02/10/23   Angiulli, Mcarthur Rossetti, PA-C  allopurinol (ZYLOPRIM) 300 MG tablet Take 1 tablet (300 mg total) by mouth daily. 02/10/23   Angiulli, Mcarthur Rossetti, PA-C  ALPRAZolam Prudy Feeler) 0.25 MG  tablet Take 0.25 mg by mouth 2 (two) times daily as needed for anxiety.    [provider]  apixaban (ELIQUIS) 5 MG TABS tablet Take 1 tablet (5 mg total) by mouth 2 (two) times daily. 02/10/23   Angiulli, Mcarthur Rossetti, PA-C  aspirin EC 81 MG tablet Take 1 tablet (81 mg total) by mouth daily. Swallow whole. 03/02/23   Pace, Peggye Ley, NP  atorvastatin (LIPITOR) 80 MG tablet Take 1 tablet (80 mg total) by mouth daily. 03/01/23   Pace, Peggye Ley, NP  citalopram (CELEXA) 20 MG tablet Take 1 tablet (20 mg total) by mouth daily. 02/10/23   Angiulli, Mcarthur Rossetti, PA-C  clonazePAM (KLONOPIN) 0.5 MG tablet Take 1 tablet (0.5 mg total) by mouth 3 (three) times daily as needed (anxiety). Patient not taking: Reported on 02/25/2023 02/10/23   Charlton Amor, PA-C  clopidogrel (PLAVIX) 75 MG tablet Take 1  tablet (75 mg total) by mouth daily. 02/16/23   Annice Needy, MD  melatonin 5 MG TABS Take 1 tablet (5 mg total) by mouth at bedtime. Patient not taking: Reported on 02/25/2023 02/10/23   Angiulli, Mcarthur Rossetti, PA-C  metoprolol tartrate (LOPRESSOR) 25 MG tablet Take 1 tablet (25 mg total) by mouth 2 (two) times daily. 02/10/23   Angiulli, Mcarthur Rossetti, PA-C  oxyCODONE (OXY IR/ROXICODONE) 5 MG immediate release tablet Take 1 tablet (5 mg total) by mouth every 4 (four) hours as needed for moderate pain (pain score 4-6). Patient not taking: Reported on 02/26/2023 02/10/23   Angiulli, Mcarthur Rossetti, PA-C  potassium chloride (KLOR-CON) 10 MEQ tablet Take 1 tablet (10 mEq total) by mouth daily. 02/10/23   Angiulli, Mcarthur Rossetti, PA-C  sacubitril-valsartan (ENTRESTO) 24-26 MG Take 1 tablet by mouth 2 (two) times daily. 02/10/23   Angiulli, Mcarthur Rossetti, PA-C  torsemide (DEMADEX) 20 MG tablet Take 1 tablet (20 mg total) by mouth daily. 02/10/23   Angiulli, Mcarthur Rossetti, PA-C  traMADol (ULTRAM) 50 MG tablet Take 100 mg by mouth every 8 (eight) hours as needed for moderate pain.    [provider]    Physical Exam: Vitals:   03/18/23 2122 03/18/23  2154 03/18/23 2345  BP:  (!) 128/53 (!) 132/54  Pulse:  94 75  Resp:  18 16  Temp:  99.9 F (37.7 C)   TempSrc:  Oral   SpO2:  98% 98%  Weight: 81.2 kg    Height: 5\' 2"  (1.575 m)     Physical Exam Vitals and nursing note reviewed.  Constitutional:      General: She is not in acute distress. HENT:     Head: Normocephalic and atraumatic.  Cardiovascular:     Rate and Rhythm: Normal rate and regular rhythm.     Heart sounds: Normal heart sounds.  Pulmonary:     Effort: Pulmonary effort is normal.     Breath sounds: Normal breath sounds.  Abdominal:     Palpations: Abdomen is soft.     Tenderness: There is no abdominal tenderness.  Musculoskeletal:     Comments: See pics below Warmth and firmness felt over area of redness on the right lateral hip but does not appear to be painful to the touch  Neurological:     Mental Status: Mental status is at baseline.        Labs on Admission: I have personally reviewed following labs and imaging studies  CBC: Recent Labs  Lab 03/18/23 2134  WBC 9.8  NEUTROABS 8.8*  HGB 9.1*  HCT 27.9*  MCV 92.1  PLT 290   Basic Metabolic Panel: Recent Labs  Lab 03/18/23 2216  NA 134*  K 3.3*  CL 96*  CO2 26  GLUCOSE 146*  BUN 18  CREATININE 1.27*  CALCIUM 8.7*   GFR: Estimated Creatinine Clearance: 34.9 mL/min (A) (by C-G formula based on SCr of 1.27 mg/dL (H)). Liver Function Tests: Recent Labs  Lab 03/18/23 2216  AST 19  ALT 12  ALKPHOS 85  BILITOT 0.9  PROT 6.5  ALBUMIN 2.9*   No results for input(s): "LIPASE", "AMYLASE" in the last 168 hours. No results for input(s): "AMMONIA" in the last 168 hours. Coagulation Profile: Recent Labs  Lab 03/18/23 2216  INR 2.2*   Cardiac Enzymes: No results for input(s): "CKTOTAL", "CKMB", "CKMBINDEX", "TROPONINI" in the last 168 hours. BNP (last 3 results) No results for input(s): "PROBNP" in the last 8760 hours. HbA1C: No  results for input(s): "HGBA1C" in the last 72  hours. CBG: No results for input(s): "GLUCAP" in the last 168 hours. Lipid Profile: No results for input(s): "CHOL", "HDL", "LDLCALC", "TRIG", "CHOLHDL", "LDLDIRECT" in the last 72 hours. Thyroid Function Tests: No results for input(s): "TSH", "T4TOTAL", "FREET4", "T3FREE", "THYROIDAB" in the last 72 hours. Anemia Panel: No results for input(s): "VITAMINB12", "FOLATE", "FERRITIN", "TIBC", "IRON", "RETICCTPCT" in the last 72 hours. Urine analysis:    Component Value Date/Time   COLORURINE STRAW (A) 03/18/2023 2136   APPEARANCEUR CLEAR (A) 03/18/2023 2136   LABSPEC 1.005 03/18/2023 2136   PHURINE 7.0 03/18/2023 2136   GLUCOSEU NEGATIVE 03/18/2023 2136   HGBUR SMALL (A) 03/18/2023 2136   BILIRUBINUR NEGATIVE 03/18/2023 2136   KETONESUR NEGATIVE 03/18/2023 2136   PROTEINUR NEGATIVE 03/18/2023 2136   NITRITE NEGATIVE 03/18/2023 2136   LEUKOCYTESUR NEGATIVE 03/18/2023 2136    Radiological Exams on Admission: DG Chest Port 1 View  Result Date: 03/18/2023 CLINICAL DATA:  Sepsis.  Evaluate for abnormalities. EXAM: PORTABLE CHEST 1 VIEW COMPARISON:  02/27/2023 FINDINGS: Shallow inspiration. Heart size and pulmonary vascularity are normal for technique. Lungs are clear. Vascular congestion and edema have improved since prior study. No pleural effusions. No pneumothorax. Mediastinal contours appear intact. Calcification of the aorta. IMPRESSION: No active disease. Electronically Signed   By: Burman Nieves M.D.   On: 03/18/2023 22:21     Data Reviewed: Relevant notes from primary care and specialist visits, past discharge summaries as available in EHR, including Care Everywhere. Prior diagnostic testing as pertinent to current admission diagnoses Updated medications and problem lists for reconciliation ED course, including vitals, labs, imaging, treatment and response to treatment Triage notes, nursing and pharmacy notes and ED provider's notes Notable results as noted in  HPI   Assessment and Plan: * Sepsis (HCC) Sepsis criteria included fever and tachycardia.  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 02/2023 Continue Eliquis and statin  Chronic diastolic CHF (congestive heart failure) (HCC) Clinically euvolemic Continue Entresto, metoprolol and torsemide Monitor for fluid overload in view of IV fluid resuscitation in the management of suspected sepsis  Atrial fibrillation, chronic (HCC) Chronic anticoagulation Currently rate controlled Continue Eliquis and metoprolol  CAD S/P percutaneous coronary angioplasty Continue aspirin, atorvastatin and metoprolol  Anxiety and depression Continue citalopram and alprazolam  Primary hypertension Continue home meds    DVT prophylaxis: Eliquis  Consults: none  Advance Care Planning:   Code Status: Prior   Family Communication: Daughter at bedside  Disposition Plan: Back to previous home environment  Severity of Illness: The appropriate patient status for this patient is INPATIENT. Inpatient status is judged to be reasonable and necessary in order to provide the required intensity of service to ensure the patient's safety. The patient's presenting symptoms, physical exam findings, and initial radiographic and laboratory data in the context of their chronic comorbidities is felt to place them at high risk for further clinical  deterioration. Furthermore, it is not anticipated that the  patient will be medically stable for discharge from the hospital within 2 midnights of admission.   * I certify that at the point of admission it is my clinical judgment that the patient will require inpatient hospital care spanning beyond 2 midnights from the point of admission due to high intensity of service, high risk for further deterioration and high frequency of surveillance required.*  Author: Andris Baumann, MD 03/19/2023 1:01 AM  For on call review [email or web address].

## 2023-03-20 ENCOUNTER — Inpatient Hospital Stay: Payer: Medicare Other

## 2023-03-20 DIAGNOSIS — R531 Weakness: Secondary | ICD-10-CM | POA: Diagnosis not present

## 2023-03-20 DIAGNOSIS — Z96641 Presence of right artificial hip joint: Secondary | ICD-10-CM | POA: Diagnosis not present

## 2023-03-20 DIAGNOSIS — M96840 Postprocedural hematoma of a musculoskeletal structure following a musculoskeletal system procedure: Secondary | ICD-10-CM

## 2023-03-20 DIAGNOSIS — A419 Sepsis, unspecified organism: Secondary | ICD-10-CM | POA: Diagnosis not present

## 2023-03-20 DIAGNOSIS — F419 Anxiety disorder, unspecified: Secondary | ICD-10-CM | POA: Diagnosis not present

## 2023-03-20 LAB — BASIC METABOLIC PANEL
Anion gap: 7 (ref 5–15)
BUN: 17 mg/dL (ref 8–23)
CO2: 26 mmol/L (ref 22–32)
Calcium: 8.3 mg/dL — ABNORMAL LOW (ref 8.9–10.3)
Chloride: 103 mmol/L (ref 98–111)
Creatinine, Ser: 1.17 mg/dL — ABNORMAL HIGH (ref 0.44–1.00)
GFR, Estimated: 47 mL/min — ABNORMAL LOW (ref >60)
Glucose, Bld: 155 mg/dL — ABNORMAL HIGH (ref 70–99)
Potassium: 3.6 mmol/L (ref 3.5–5.1)
Sodium: 136 mmol/L (ref 135–145)

## 2023-03-20 LAB — CBC
HCT: 25.6 % — ABNORMAL LOW (ref 36.0–46.0)
Hemoglobin: 8.2 g/dL — ABNORMAL LOW (ref 12.0–15.0)
MCH: 29.8 pg (ref 26.0–34.0)
MCHC: 32 g/dL (ref 30.0–36.0)
MCV: 93.1 fL (ref 80.0–100.0)
Platelets: 218 10*3/uL (ref 150–400)
RBC: 2.75 MIL/uL — ABNORMAL LOW (ref 3.87–5.11)
RDW: 16.9 % — ABNORMAL HIGH (ref 11.5–15.5)
WBC: 7.8 10*3/uL (ref 4.0–10.5)
nRBC: 0 % (ref 0.0–0.2)

## 2023-03-20 LAB — MAGNESIUM: Magnesium: 1.4 mg/dL — ABNORMAL LOW (ref 1.7–2.4)

## 2023-03-20 NOTE — Progress Notes (Signed)
1      PROGRESS NOTE    Brooke Davis  WGN:[id number] DOB: 09/21/1940 DOA: 03/18/2023 PCP: [name], MD    Brief Narrative:    82 y.o. 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 02/2023 Continue Eliquis and statin   Chronic diastolic CHF (congestive heart failure) (HCC) Clinically euvolemic Continue Entresto, metoprolol and  torsemide Monitor for fluid overload in view of IV fluid resuscitation in the management of suspected sepsis   Atrial fibrillation, chronic (HCC) Chronic anticoagulation Currently rate controlled Continue Eliquis and metoprolol   CAD S/P percutaneous coronary angioplasty Continue aspirin, atorvastatin and metoprolol   Anxiety and depression Continue citalopram and alprazolam   Primary hypertension Continue home meds  Goals of care: Will consult palliative care.  I have discussed with all family at bedside.  Patient does not have a living will and has not given power of attorney to any family members yet   DVT prophylaxis:  apixaban (ELIQUIS) tablet 5 mg     Code Status: (Full code Family Communication: (Updated daughter and remaining family numbers at the bedside Disposition Plan: Home with home health versus SNF depending on clinical progress   Consultants:  Orthopedics    Antimicrobials:  IV cefepime and vancomycin   Subjective: Patient is very hard of hearing, reports minimal pain  Objective: Vitals:   03/19/23 1547 03/19/23 2221 03/20/23 0828 03/20/23 0848  BP: 139/70 (!) 162/70 (!) 145/107 (!) 153/70  Pulse: 68 65 78 73  Resp: 16   16  Temp: 98.4 F (36.9 C) 98.1 F (36.7 C) 98.1 F (36.7 C) 98.4 F (36.9 C)  TempSrc: Oral Oral  Oral  SpO2: 100% 100% 98% 98%  Weight:      Height:        Intake/Output Summary (Last 24 hours) at 03/20/2023 1641 Last data filed at 03/20/2023 1025 Gross per 24 hour  Intake 500 ml  Output --  Net 500 ml  Filed Weights   03/18/23 2122 03/19/23 0753  Weight: 81.2 kg 83.8 kg    Examination:  General exam: Appears calm and comfortable  Respiratory system: Clear to auscultation. Respiratory effort normal. Cardiovascular system: S1 & S2 heard, RRR. No JVD, murmurs, rubs, gallops or clicks. No pedal edema. Gastrointestinal system: Abdomen is nondistended, soft and nontender. No organomegaly or masses felt. Normal  bowel sounds heard. Central nervous system: Alert and oriented. No focal neurological deficits. Extremities: Well-healed surgical wound on the right hip, minimal erythema and warmth/induration around distal aspect of right hip Skin: As above listed in the extremities Psychiatry: Judgement and insight appear normal. Mood & affect appropriate.     Data Reviewed: I have personally reviewed following labs and imaging studies  CBC: Recent Labs  Lab 03/18/23 2134 03/20/23 0605  WBC 9.8 7.8  NEUTROABS 8.8*  --   HGB 9.1* 8.2*  HCT 27.9* 25.6*  MCV 92.1 93.1  PLT 290 218   Basic Metabolic Panel: Recent Labs  Lab 03/18/23 2216 03/20/23 0605  NA 134* 136  K 3.3* 3.6  CL 96* 103  CO2 26 26  GLUCOSE 146* 155*  BUN 18 17  CREATININE 1.27* 1.17*  CALCIUM 8.7* 8.3*  MG  --  1.4*   GFR: Estimated Creatinine Clearance: 38.5 mL/min (A) (by C-G formula based on SCr of 1.17 mg/dL (H)). Liver Function Tests: Recent Labs  Lab 03/18/23 2216  AST 19  ALT 12  ALKPHOS 85  BILITOT 0.9  PROT 6.5  ALBUMIN 2.9*   No results for input(s): "LIPASE", "AMYLASE" in the last 168 hours. No results for input(s): "AMMONIA" in the last 168 hours. Coagulation Profile: Recent Labs  Lab 03/18/23 2216  INR 2.2*   Sepsis Labs: Recent Labs  Lab 03/18/23 2134 03/19/23 0142  PROCALCITON  --  0.46  LATICACIDVEN 1.7 1.2    Recent Results (from the past 240 hour(s))  Blood Culture (routine x 2)     Status: None (Preliminary result)   Collection Time: 03/18/23  9:34 PM   Specimen: BLOOD RIGHT HAND  Result Value Ref Range Status   Specimen Description BLOOD RIGHT HAND  Final   Special Requests   Final    BOTTLES DRAWN AEROBIC ONLY Blood Culture adequate volume   Culture   Final    NO GROWTH 2 DAYS Performed at [facility], [address] [place], [place] [address]    Report Status PENDING  Incomplete  Blood Culture (routine x 2)     Status: None (Preliminary result)    Collection Time: 03/18/23  9:36 PM   Specimen: BLOOD LEFT HAND  Result Value Ref Range Status   Specimen Description BLOOD LEFT HAND  Final   Special Requests   Final    BOTTLES DRAWN AEROBIC AND ANAEROBIC Blood Culture adequate volume   Culture   Final    NO GROWTH 2 DAYS Performed at [facility], [address], [place], [place] [address]    Report Status PENDING  Incomplete  Resp panel by RT-PCR (RSV, Flu A&B, Covid) Anterior Nasal Swab     Status: None   Collection Time: 03/18/23 10:28 PM   Specimen: Anterior Nasal Swab  Result Value Ref Range Status   SARS Coronavirus 2 by RT PCR NEGATIVE NEGATIVE Final    Comment: (NOTE) SARS-CoV-2 target nucleic acids are NOT DETECTED.  The SARS-CoV-2 RNA is generally detectable in upper respiratory specimens during the acute phase of infection. The lowest concentration of SARS-CoV-2  viral copies this assay can detect is 138 copies/mL. A negative result does not preclude SARS-Cov-2 infection and should not be used as the sole basis for treatment or other patient management decisions. A negative result may occur with  improper specimen collection/handling, submission of specimen other than nasopharyngeal swab, presence of viral mutation(s) within the areas targeted by this assay, and inadequate number of viral copies(<138 copies/mL). A negative result must be combined with clinical observations, patient history, and epidemiological information. The expected result is Negative.  Fact Sheet for Patients:  BloggerCourse.com  Fact Sheet for Healthcare Providers:  SeriousBroker.it  This test is no t yet approved or cleared by the [place] FDA and  has been authorized for detection and/or diagnosis of SARS-CoV-2 by FDA under an Emergency Use Authorization (EUA). This EUA will remain  in effect (meaning this test can be used) for the duration of the COVID-19 declaration under  Section 564(b)(1) of the Act, 21 U.S.C.section 360bbb-3(b)(1), unless the authorization is terminated  or revoked sooner.       Influenza A by PCR NEGATIVE NEGATIVE Final   Influenza B by PCR NEGATIVE NEGATIVE Final    Comment: (NOTE) The Xpert Xpress SARS-CoV-2/FLU/RSV plus assay is intended as an aid in the diagnosis of influenza from Nasopharyngeal swab specimens and should not be used as a sole basis for treatment. Nasal washings and aspirates are unacceptable for Xpert Xpress SARS-CoV-2/FLU/RSV testing.  Fact Sheet for Patients: BloggerCourse.com  Fact Sheet for Healthcare Providers: SeriousBroker.it  This test is not yet approved or cleared by the [place] FDA and has been authorized for detection and/or diagnosis of SARS-CoV-2 by FDA under an Emergency Use Authorization (EUA). This EUA will remain in effect (meaning this test can be used) for the duration of the COVID-19 declaration under Section 564(b)(1) of the Act, 21 U.S.C. section 360bbb-3(b)(1), unless the authorization is terminated or revoked.     Resp Syncytial Virus by PCR NEGATIVE NEGATIVE Final    Comment: (NOTE) Fact Sheet for Patients: BloggerCourse.com  Fact Sheet for Healthcare Providers: SeriousBroker.it  This test is not yet approved or cleared by the [place] FDA and has been authorized for detection and/or diagnosis of SARS-CoV-2 by FDA under an Emergency Use Authorization (EUA). This EUA will remain in effect (meaning this test can be used) for the duration of the COVID-19 declaration under Section 564(b)(1) of the Act, 21 U.S.C. section 360bbb-3(b)(1), unless the authorization is terminated or revoked.  Performed at [facility], [address] [place], [place] [address]   MRSA Next Gen by PCR, Nasal     Status: None   Collection Time: 03/19/23 11:02 AM   Specimen: Nasal  Mucosa; Nasal Swab  Result Value Ref Range Status   MRSA by PCR Next Gen NOT DETECTED NOT DETECTED Final    Comment: (NOTE) The GeneXpert MRSA Assay (FDA approved for NASAL specimens only), is one component of a comprehensive MRSA colonization surveillance program. It is not intended to diagnose MRSA infection nor to guide or monitor treatment for MRSA infections. Test performance is not FDA approved in patients less than 3 years old. Performed at [facility], [address] [place], [place] [address]          Radiology Studies: DG HIP UNILAT WITH PELVIS 2-3 VIEWS RIGHT  Result Date: 03/20/2023 CLINICAL DATA:  Right hip swelling. EXAM: DG HIP (WITH OR WITHOUT PELVIS) 2-3V RIGHT COMPARISON:  None Available. FINDINGS: Status post right total hip replacement. No evidence  for periprosthetic fracture. There is soft tissue swelling over the lateral hip with multiple scattered tiny lucencies, likely edema but soft tissue gas not excluded. IMPRESSION: 1. Status post right total hip replacement without evidence for periprosthetic fracture. 2. Soft tissue swelling over the lateral hip with multiple scattered tiny lucencies, likely edema but soft tissue gas not excluded. CT scan of the hip recommended to exclude soft tissue gas. 3. These results will be called to the ordering clinician or representative by the Radiologist Assistant, and communication documented in the PACS or [employer]. Electronically Signed   By: Kennith Center M.D.   On: 03/20/2023 16:28   DG Chest Port 1 View  Result Date: 03/18/2023 CLINICAL DATA:  Sepsis.  Evaluate for abnormalities. EXAM: PORTABLE CHEST 1 VIEW COMPARISON:  02/27/2023 FINDINGS: Shallow inspiration. Heart size and pulmonary vascularity are normal for technique. Lungs are clear. Vascular congestion and edema have improved since prior study. No pleural effusions. No pneumothorax. Mediastinal contours appear intact. Calcification of the aorta.  IMPRESSION: No active disease. Electronically Signed   By: Burman Nieves M.D.   On: 03/18/2023 22:21        Scheduled Meds:  allopurinol  300 mg Oral Daily   apixaban  5 mg Oral BID   atorvastatin  80 mg Oral Daily   citalopram  20 mg Oral Daily   clopidogrel  75 mg Oral Daily   metoprolol tartrate  25 mg Oral BID   sacubitril-valsartan  1 tablet Oral BID   torsemide  20 mg Oral Daily   Continuous Infusions:  ceFEPime (MAXIPIME) IV 2 g (03/20/23 1350)   [START ON 03/21/2023] vancomycin       LOS: 2 days    Time spent: 35 minutes    Ramonita Koenig Sherryll Burger, MD [facility] Pager [phone]  If 7PM-7AM, please contact night-coverage [email or web address]  03/20/2023, 4:41 PM

## 2023-03-20 NOTE — Plan of Care (Signed)
  Problem: Fluid Volume: Goal: Hemodynamic stability will improve 03/20/2023 1932 by Emilio Aspen, RN Outcome: Progressing 03/20/2023 1932 by Emilio Aspen, RN Outcome: Progressing   Problem: Clinical Measurements: Goal: Diagnostic test results will improve 03/20/2023 1932 by Emilio Aspen, RN Outcome: Progressing 03/20/2023 1932 by Emilio Aspen, RN Outcome: Progressing Goal: Signs and symptoms of infection will decrease 03/20/2023 1932 by Emilio Aspen, RN Outcome: Progressing 03/20/2023 1932 by Emilio Aspen, RN Outcome: Progressing   Problem: Respiratory: Goal: Ability to maintain adequate ventilation will improve 03/20/2023 1932 by Emilio Aspen, RN Outcome: Progressing 03/20/2023 1932 by Emilio Aspen, RN Outcome: Progressing   Problem: Education: Goal: Knowledge of General Education information will improve Description: Including pain rating scale, medication(s)/side effects and non-pharmacologic comfort measures 03/20/2023 1932 by Emilio Aspen, RN Outcome: Progressing 03/20/2023 1932 by Emilio Aspen, RN Outcome: Progressing   Problem: Health Behavior/Discharge Planning: Goal: Ability to manage health-related needs will improve 03/20/2023 1932 by Emilio Aspen, RN Outcome: Progressing 03/20/2023 1932 by Emilio Aspen, RN Outcome: Progressing   Problem: Clinical Measurements: Goal: Ability to maintain clinical measurements within normal limits will improve 03/20/2023 1932 by Emilio Aspen, RN Outcome: Progressing 03/20/2023 1932 by Emilio Aspen, RN Outcome: Progressing Goal: Will remain free from infection 03/20/2023 1932 by Emilio Aspen, RN Outcome: Progressing 03/20/2023 1932 by Emilio Aspen, RN Outcome: Progressing Goal: Diagnostic test results will improve 03/20/2023 1932 by Emilio Aspen, RN Outcome: Progressing 03/20/2023 1932 by Emilio Aspen, RN Outcome: Progressing Goal: Respiratory complications will  improve 03/20/2023 1932 by Emilio Aspen, RN Outcome: Progressing 03/20/2023 1932 by Emilio Aspen, RN Outcome: Progressing Goal: Cardiovascular complication will be avoided 03/20/2023 1932 by Emilio Aspen, RN Outcome: Progressing 03/20/2023 1932 by Emilio Aspen, RN Outcome: Progressing   Problem: Activity: Goal: Risk for activity intolerance will decrease 03/20/2023 1932 by Emilio Aspen, RN Outcome: Progressing 03/20/2023 1932 by Emilio Aspen, RN Outcome: Progressing   Problem: Nutrition: Goal: Adequate nutrition will be maintained 03/20/2023 1932 by Emilio Aspen, RN Outcome: Progressing 03/20/2023 1932 by Emilio Aspen, RN Outcome: Progressing   Problem: Coping: Goal: Level of anxiety will decrease 03/20/2023 1932 by Emilio Aspen, RN Outcome: Progressing 03/20/2023 1932 by Emilio Aspen, RN Outcome: Progressing   Problem: Elimination: Goal: Will not experience complications related to bowel motility 03/20/2023 1932 by Emilio Aspen, RN Outcome: Progressing 03/20/2023 1932 by Emilio Aspen, RN Outcome: Progressing Goal: Will not experience complications related to urinary retention 03/20/2023 1932 by Emilio Aspen, RN Outcome: Progressing 03/20/2023 1932 by Emilio Aspen, RN Outcome: Progressing   Problem: Pain Managment: Goal: General experience of comfort will improve 03/20/2023 1932 by Emilio Aspen, RN Outcome: Progressing 03/20/2023 1932 by Emilio Aspen, RN Outcome: Progressing   Problem: Safety: Goal: Ability to remain free from injury will improve 03/20/2023 1932 by Emilio Aspen, RN Outcome: Progressing 03/20/2023 1932 by Emilio Aspen, RN Outcome: Progressing   Problem: Skin Integrity: Goal: Risk for impaired skin integrity will decrease 03/20/2023 1932 by Emilio Aspen, RN Outcome: Progressing 03/20/2023 1932 by Emilio Aspen, RN Outcome: Progressing

## 2023-03-20 NOTE — Plan of Care (Signed)
  Problem: Fluid Volume: Goal: Hemodynamic stability will improve Outcome: Progressing   Problem: Clinical Measurements: Goal: Diagnostic test results will improve Outcome: Progressing   Problem: Education: Goal: Knowledge of General Education information will improve Description: Including pain rating scale, medication(s)/side effects and non-pharmacologic comfort measures Outcome: Progressing   Problem: Activity: Goal: Risk for activity intolerance will decrease Outcome: Progressing   Problem: Coping: Goal: Level of anxiety will decrease Outcome: Progressing   Problem: Safety: Goal: Ability to remain free from injury will improve Outcome: Progressing

## 2023-03-20 NOTE — Plan of Care (Signed)

## 2023-03-20 NOTE — Consult Note (Signed)
Orthopedic Surgery Inpatient Consultation Note  Reason for consult: Concern for right hip infection  Date of consult: March 20, 2023 Referring provider: Delfino Lovett, MD  Chief Complaint: Chief Complaint  Patient presents with   Code Sepsis    HPI: Brooke Davis IS A female who is seen today regarding a complaint of right hip swelling after recent hip surgery. The patient is not the best historian.  The daughter was present as well.  The story is that the patient had a total hip replacement on July 1 with Dr. Ernest Pine and was last seen on August 20 at that time they report that a retained staple was removed.  Starting this week on Wednesday the patient noticed some swelling in her hip area and there was a fall but it is unclear whether or not the fall happened first or the swelling happened but the patient was admitted to the hospital on August 29 for sepsis.  Between her total hip replacement surgery she also had some problems with her heart, the daughter reports a heart attack, some stenting, and that she is on multiple blood thinners at this time.  Patient reports some kind of possible surgery to her artery in her neck as well since the orthopedic surgery in July.  The daughter says that on Wednesday the aide that was with the patient said she had a small golf ball sized area of swelling on the right hip area and then on Thursday when the daughter saw her she said it was much more significantly swollen and sometime between when the aide left and the daughter got there patient had a fall.  Details are not clear however in that regard.  Nature: Seemingly acute Location: Right hip Intensity: Mild pain Character: Dull Modifying Factors: None Prior surgery: Yes orthopedic right total hip replacement January 18, 2023 Was there trauma: Fall this week on Wednesday  Past Medical History: Reviewed. Past Medical History:  Diagnosis Date   Anxiety    a.) on BZO (alprazolam) PRN   Arthritis     fingers, right hip   Atrial fibrillation (HCC) 11/10/2013   a.) CHA2DS2VASc = 5 (age x2, sex, HTN, T2DM);  b.) rate/rhythm maintained on oral metoprolol tartrate; chronically anticoagulated with apixaban   Bronchitis    Cerebrovascular small vessel disease (chronic)    Coronary artery disease involving native coronary artery of native heart 01/11/2021   a.) LHC 01/11/2021: 40% oLM, 50% mLCx, 30% oLCx, 30% pLAD, 30% mLAD, 99% pRCA (2.75 x 30 mm Resolute Onyx DES), 75% p-mRCA, 60% dRCA   DDD (degenerative disc disease), lumbar 12/01/2013   Diastolic dysfunction 01/12/2021   a.) TTE 01/12/2021 (s/p inf STEMI): EF 55-60%, mild-mod LAE, mild RAE, mod RVE, mild AR/MR, mod TR, G1DD   Diet-controlled type 2 diabetes mellitus (HCC)    Full dentures    Gout    History of bilateral cataract extraction 2021   Hyperlipidemia    Hypertension, essential    Long term current use of anticoagulant    a.) apixaban   PONV (postoperative nausea and vomiting)    Sinoatrial node dysfunction (HCC) 11/10/2013   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) 01/11/2021   a.) LHC/PCI 01/11/2021 --> culprit lesion 99% pRCA --> 2.75 x 30 mm Resolute Onyx DES x 1   Varicose veins of legs    Wears hearing aid in both ears     Past Surgical History: Reviewed. Past Surgical History:  Procedure Laterality Date   BREAST  BIOPSY Left 2011   NEG   CARDIAC CATHETERIZATION     over 20 yrs ago.  All OK.   CAROTID PTA/STENT INTERVENTION Left 02/25/2023   Procedure: CAROTID PTA/STENT INTERVENTION;  Surgeon: Annice Needy, MD;  Location: [facility] INVASIVE CV LAB;  Service: Cardiovascular;  Laterality: Left;   CATARACT EXTRACTION W/PHACO Left 03/27/2020   Procedure: CATARACT EXTRACTION PHACO AND INTRAOCULAR LENS PLACEMENT (IOC) LEFT 7.49  00:56.4  13.3%;  Surgeon: Lockie Mola, MD;  Location: [facility];  Service: Ophthalmology;  Laterality: Left;   CATARACT EXTRACTION W/PHACO Right 04/24/2020    Procedure: CATARACT EXTRACTION PHACO AND INTRAOCULAR LENS PLACEMENT (IOC) RIGHT;  Surgeon: Lockie Mola, MD;  Location: [facility];  Service: Ophthalmology;  Laterality: Right;  8.34 0:50.2 16.6%   CORONARY/GRAFT ACUTE MI REVASCULARIZATION N/A 01/11/2021   Procedure: Coronary/Graft Acute MI Revascularization;  Surgeon: Marcina Millard, MD;  Location: [facility] INVASIVE CV LAB;  Service: Cardiovascular;  Laterality: N/A;   LEFT HEART CATH AND CORONARY ANGIOGRAPHY N/A 01/11/2021   Procedure: LEFT HEART CATH AND CORONARY ANGIOGRAPHY;  Surgeon: Marcina Millard, MD;  Location: [facility] INVASIVE CV LAB;  Service: Cardiovascular;  Laterality: N/A;   PARTIAL HYSTERECTOMY     TOE SURGERY Left    great toe; rod in place   TOTAL HIP ARTHROPLASTY Right 01/18/2023   Procedure: TOTAL HIP ARTHROPLASTY;  Surgeon: Donato Heinz, MD;  Location: [facility] ORS;  Service: Orthopedics;  Laterality: Right;   TOTAL KNEE ARTHROPLASTY Right 2007    Family History: Reviewed. The family history includes Alcoholism in her brother; Diabetes in her father; Heart attack (age of onset: 50) in her mother; Stroke in her father.  Social History: Reviewed. Social History   Socioeconomic History   Marital status: Widowed    Spouse name: Not on file   Number of children: 4   Years of education: Not on file   Highest education level: Not on file  Occupational History   Not on file  Tobacco Use   Smoking status: Former    Current packs/day: 0.00    Types: Cigarettes    Quit date: 1980    Years since quitting: 44.6   Smokeless tobacco: Never  Vaping Use   Vaping status: Never Used  Substance and Sexual Activity   Alcohol use: Not Currently   Drug use: Never   Sexual activity: Not on file  Other Topics Concern   Not on file  Social History Narrative   Not on file   Social Determinants of Health   Financial Resource Strain: Patient Declined (11/05/2022)   Received from [facility], [facility]   Overall Financial Resource Strain (CARDIA)    Difficulty of Paying Living Expenses: Patient declined  Food Insecurity: No Food Insecurity (03/19/2023)   Hunger Vital Sign    Worried About Running Out of Food in the Last Year: Never true    Ran Out of Food in the Last Year: Never true  Transportation Needs: No Transportation Needs (03/19/2023)   PRAPARE - Administrator, Civil Service (Medical): No    Lack of Transportation (Non-Medical): No  Physical Activity: Not on file  Stress: Not on file  Social Connections: Not on file    Home Meds: Reviewed. Prior to Admission medications   Medication Sig Start Date End Date Taking? Authorizing Provider  allopurinol (ZYLOPRIM) 300 MG tablet Take 1 tablet (300 mg total) by mouth daily. 02/10/23  Yes Angiulli, Mcarthur Rossetti, PA-C  ALPRAZolam (  XANAX) 0.25 MG tablet Take 0.25 mg by mouth 2 (two) times daily as needed for anxiety.   Yes [provider]  apixaban (ELIQUIS) 5 MG TABS tablet Take 1 tablet (5 mg total) by mouth 2 (two) times daily. 02/10/23  Yes Angiulli, Mcarthur Rossetti, PA-C  aspirin EC 81 MG tablet Take 1 tablet (81 mg total) by mouth daily. Swallow whole. 03/02/23  Yes Pace, Brien R, NP  atorvastatin (LIPITOR) 80 MG tablet Take 1 tablet (80 mg total) by mouth daily. 03/01/23  Yes Pace, Brien R, NP  citalopram (CELEXA) 20 MG tablet Take 1 tablet (20 mg total) by mouth daily. 02/10/23  Yes Angiulli, Mcarthur Rossetti, PA-C  clopidogrel (PLAVIX) 75 MG tablet Take 1 tablet (75 mg total) by mouth daily. 02/16/23  Yes Dew, Marlow Baars, MD  metoprolol tartrate (LOPRESSOR) 25 MG tablet Take 1 tablet (25 mg total) by mouth 2 (two) times daily. 02/10/23  Yes Angiulli, Mcarthur Rossetti, PA-C  potassium chloride (KLOR-CON) 10 MEQ tablet Take 1 tablet (10 mEq total) by mouth daily. 02/10/23  Yes Angiulli, Mcarthur Rossetti, PA-C  sacubitril-valsartan (ENTRESTO) 24-26 MG Take 1 tablet by mouth 2 (two) times daily. 02/10/23  Yes Angiulli,  Mcarthur Rossetti, PA-C  torsemide (DEMADEX) 20 MG tablet Take 1 tablet (20 mg total) by mouth daily. 02/10/23  Yes Angiulli, Mcarthur Rossetti, PA-C  traMADol (ULTRAM) 50 MG tablet Take 100 mg by mouth every 8 (eight) hours as needed for moderate pain.   Yes [provider]  acetaminophen (TYLENOL) 325 MG tablet Take 2 tablets (650 mg total) by mouth every 6 (six) hours as needed for mild pain (or Fever >/= 101). 02/10/23   Angiulli, Mcarthur Rossetti, PA-C  clonazePAM (KLONOPIN) 0.5 MG tablet Take 1 tablet (0.5 mg total) by mouth 3 (three) times daily as needed (anxiety). Patient not taking: Reported on 02/25/2023 02/10/23   Angiulli, Mcarthur Rossetti, PA-C  melatonin 5 MG TABS Take 1 tablet (5 mg total) by mouth at bedtime. Patient not taking: Reported on 02/25/2023 02/10/23   Angiulli, Mcarthur Rossetti, PA-C  oxyCODONE (OXY IR/ROXICODONE) 5 MG immediate release tablet Take 1 tablet (5 mg total) by mouth every 4 (four) hours as needed for moderate pain (pain score 4-6). Patient not taking: Reported on 02/26/2023 02/10/23   Charlton Amor, [facility] Medications: Reviewed. Current Facility-Administered Medications  Medication Dose Route Frequency Provider Last Rate Last Admin   acetaminophen (TYLENOL) tablet 650 mg  650 mg Oral Q6H PRN Pokhrel, Laxman, MD   650 mg at 03/19/23 1611   allopurinol (ZYLOPRIM) tablet 300 mg  300 mg Oral Daily Lindajo Royal V, MD   300 mg at 03/20/23 1610   ALPRAZolam Prudy Feeler) tablet 0.25 mg  0.25 mg Oral BID PRN Andris Baumann, MD   0.25 mg at 03/19/23 2223   apixaban (ELIQUIS) tablet 5 mg  5 mg Oral BID Andris Baumann, MD   5 mg at 03/20/23 0942   atorvastatin (LIPITOR) tablet 80 mg  80 mg Oral Daily Lindajo Royal V, MD   80 mg at 03/20/23 0942   ceFEPIme (MAXIPIME) 2 g in sodium chloride 0.9 % 100 mL IVPB  2 g Intravenous Q12H Lindajo Royal V, MD 200 mL/hr at 03/19/23 2343 2 g at 03/19/23 2343   citalopram (CELEXA) tablet 20 mg  20 mg Oral Daily Andris Baumann, MD   20 mg at 03/20/23 0942    clopidogrel (PLAVIX) tablet 75 mg  75 mg Oral Daily  Andris Baumann, MD   75 mg at 03/20/23 3875   melatonin tablet 5 mg  5 mg Oral QHS PRN Andris Baumann, MD       metoprolol tartrate (LOPRESSOR) tablet 25 mg  25 mg Oral BID Pokhrel, Laxman, MD   25 mg at 03/20/23 0942   ondansetron (ZOFRAN) tablet 4 mg  4 mg Oral Q6H PRN Andris Baumann, MD   4 mg at 03/20/23 6433   Or   ondansetron (ZOFRAN) injection 4 mg  4 mg Intravenous Q6H PRN Andris Baumann, MD       oxyCODONE (Oxy IR/ROXICODONE) immediate release tablet 5 mg  5 mg Oral Q4H PRN Andris Baumann, MD   5 mg at 03/20/23 2951   sacubitril-valsartan (ENTRESTO) 24-26 mg per tablet  1 tablet Oral BID Andris Baumann, MD   1 tablet at 03/20/23 0942   torsemide (DEMADEX) tablet 20 mg  20 mg Oral Daily Andris Baumann, MD   20 mg at 03/20/23 0942   [START ON 03/21/2023] vancomycin (VANCOCIN) IVPB 1000 mg/200 mL premix  1,000 mg Intravenous Q48H Andris Baumann, MD        Allergies: Reviewed. Allergies  Allergen Reactions   Celebrex [Celecoxib] Diarrhea   Penicillins Itching    IgE = 154 (WNL) on 01/05/2023   Relafen [Nabumetone]     Palpitation    Lovastatin Diarrhea    Review of Systems: General ROS: negative for - fever Psychological ROS: negative Ophthalmic ROS: negative ENT ROS: negative Allergy and Immunology ROS: negative Hematological and Lymphatic ROS: negative Endocrine ROS: negative Cardiovascular ROS: negative Gastrointestinal ROS: negative Musculoskeletal ROS: negative except HPI Neurological ROS: negative Dermatological ROS: negative  Physical Exam: Today's Vitals   03/20/23 0230 03/20/23 0828 03/20/23 0848 03/20/23 0900  BP:  (!) 145/107 (!) 153/70   Pulse:  78 73   Resp:   16   Temp:  98.1 F (36.7 C) 98.4 F (36.9 C)   TempSrc:   Oral   SpO2:  98% 98%   Weight:      Height:      PainSc: 1    6    Body mass index is 33.79 kg/m.  Body mass index is 33.79 kg/m.      General Appearance: alert,  appears stated age, cooperative, in no acute distress Head: Normocephalic, without obvious abnormality, atraumatic Eyes: No scleral injection or drainage Ears: Gross hearing intact Neck: No JVD, supple, symmetrical, trachea midline Lungs: Non-labored breathing Heart: Regular rate and rhythm Extremities: The surgical wound on the right hip is well-healed.  On the distal aspect of the wound there is an area of swelling that is also indurated and may be consistent with a hematoma.  The skin is intact throughout.  There is no drainage.  There is warmth over the area of swelling.  Thigh is soft and compressible otherwise.  She is moving the hip and the knee well.  She is not in any distress.  There is no sign of any actively expanding soft tissue process. Pulses: 2+ and symmetric Skin: Normal except as noted in examination Neurologic: Grossly normal   Imaging/Diagnostics: X-rays on this admission are not done for the right hip.   Labs: I have reviewed all of the pertinent labs in the patient chart. WBC  Date Value Ref Range Status  03/20/2023 7.8 4.0 - 10.5 K/uL Final   Hemoglobin  Date Value Ref Range Status  03/20/2023 8.2 (L) 12.0 - 15.0  g/dL Final   HCT  Date Value Ref Range Status  03/20/2023 25.6 (L) 36.0 - 46.0 % Final   Sodium  Date Value Ref Range Status  03/20/2023 136 135 - 145 mmol/L Final   Potassium  Date Value Ref Range Status  03/20/2023 3.6 3.5 - 5.1 mmol/L Final   Chloride  Date Value Ref Range Status  03/20/2023 103 98 - 111 mmol/L Final   TCO2  Date Value Ref Range Status  02/18/2021 27 22 - 32 mmol/L Final   BUN  Date Value Ref Range Status  03/20/2023 17 8 - 23 mg/dL Final   INR  Date Value Ref Range Status  03/18/2023 2.2 (H) 0.8 - 1.2 Final    Comment:    (NOTE) INR goal varies based on device and disease states. Performed at [facility], [address] [place], [place] [address]    B Natriuretic Peptide  Date Value Ref  Range Status  06/25/2022 433.9 (H) 0.0 - 100.0 pg/mL Final    Comment:    Performed at [facility], [address] [place] [place], [place] [address]       Assessment/Plan:  82 year old female with right total hip replacement January 18, 2023.  Admitted to the hospital this week with sepsis and swelling of the right hip area.  Orthopedics was consulted to evaluate and to consider any possible infection.  Her exam is consistent with a hematoma.  Infection however is not entirely ruled out at this time.  I discussed this with the patient and the daughter. Other than some hypertension currently she is hemodynamically stable with a normal white count.  I have recommended to the patient and her daughter that we obtain x-rays of the hip and also an MRI of the right thigh to look at the underlying source of the swelling.  I would reevaluate after the imaging is done.  Patient and daughter are in agreement with the plan.  Questions answered today  I will discuss this with the hospitalist team. Thank you.     Signed: Nada Boozer, MD, Orthopedic Surgeon [facility]  8/31/202411:29 AM  Note: Dictation was assisted using commercial voice recognition software.There may be uncorrected typographical errors within the document.  Please be aware of this information when reading or considering this document. Thank you.

## 2023-03-20 NOTE — Progress Notes (Signed)
Reviewed MRI results with Dr. Nada Boozer - concerning for possible abscess.  He is planning to take her to the OR tomorrow.  Will request n.p.o. after midnight.  I have placed this order.

## 2023-03-21 ENCOUNTER — Inpatient Hospital Stay: Payer: Medicare Other | Admitting: Anesthesiology

## 2023-03-21 ENCOUNTER — Encounter
Admission: EM | Disposition: A | Discharge status: Home Health | Payer: Self-pay | Source: Home / Self Care | Attending: Internal Medicine

## 2023-03-21 ENCOUNTER — Inpatient Hospital Stay: Payer: Self-pay | Admitting: Anesthesiology

## 2023-03-21 ENCOUNTER — Inpatient Hospital Stay: Payer: Medicare Other

## 2023-03-21 DIAGNOSIS — A419 Sepsis, unspecified organism: Secondary | ICD-10-CM | POA: Diagnosis not present

## 2023-03-21 DIAGNOSIS — T8451XA Infection and inflammatory reaction due to internal right hip prosthesis, initial encounter: Secondary | ICD-10-CM

## 2023-03-21 HISTORY — PX: INCISION AND DRAINAGE: SHX5863

## 2023-03-21 LAB — CBC
HCT: 26.6 % — ABNORMAL LOW (ref 36.0–46.0)
Hemoglobin: 8.6 g/dL — ABNORMAL LOW (ref 12.0–15.0)
MCH: 29.8 pg (ref 26.0–34.0)
MCHC: 32.3 g/dL (ref 30.0–36.0)
MCV: 92 fL (ref 80.0–100.0)
Platelets: 250 10*3/uL (ref 150–400)
RBC: 2.89 MIL/uL — ABNORMAL LOW (ref 3.87–5.11)
RDW: 16.8 % — ABNORMAL HIGH (ref 11.5–15.5)
WBC: 8.4 10*3/uL (ref 4.0–10.5)
nRBC: 0 % (ref 0.0–0.2)

## 2023-03-21 LAB — BASIC METABOLIC PANEL
Anion gap: 8 (ref 5–15)
BUN: 17 mg/dL (ref 8–23)
CO2: 26 mmol/L (ref 22–32)
Calcium: 8.3 mg/dL — ABNORMAL LOW (ref 8.9–10.3)
Chloride: 100 mmol/L (ref 98–111)
Creatinine, Ser: 1.17 mg/dL — ABNORMAL HIGH (ref 0.44–1.00)
GFR, Estimated: 47 mL/min — ABNORMAL LOW (ref >60)
Glucose, Bld: 111 mg/dL — ABNORMAL HIGH (ref 70–99)
Potassium: 3.5 mmol/L (ref 3.5–5.1)
Sodium: 134 mmol/L — ABNORMAL LOW (ref 135–145)

## 2023-03-21 LAB — PREPARE RBC (CROSSMATCH)

## 2023-03-21 LAB — SEDIMENTATION RATE: Sed Rate: 63 mm/h — ABNORMAL HIGH (ref 0–30)

## 2023-03-21 LAB — C-REACTIVE PROTEIN: CRP: 10.5 mg/dL — ABNORMAL HIGH (ref <1.0)

## 2023-03-21 SURGERY — INCISION AND DRAINAGE
Anesthesia: General | Laterality: Right

## 2023-03-21 MED ORDER — ONDANSETRON HCL 4 MG/2ML IJ SOLN
INTRAMUSCULAR | Status: AC
  Filled 2023-03-21: qty 2

## 2023-03-21 MED ORDER — NEOMYCIN-POLYMYXIN B GU 40-200000 IR SOLN
Status: DC | PRN
  Administered 2023-03-21: 36 mL

## 2023-03-21 MED ORDER — METOPROLOL TARTRATE 25 MG PO TABS
12.5000 mg | ORAL_TABLET | Freq: Two times a day (BID) | ORAL | Status: DC
  Administered 2023-03-22 – 2023-04-02 (×20): 12.5 mg via ORAL
  Filled 2023-03-21 (×23): qty 1

## 2023-03-21 MED ORDER — 0.9 % SODIUM CHLORIDE (POUR BTL) OPTIME
TOPICAL | Status: DC | PRN
  Administered 2023-03-21: 9000 mL

## 2023-03-21 MED ORDER — ONDANSETRON HCL 4 MG/2ML IJ SOLN
INTRAMUSCULAR | Status: DC | PRN
  Administered 2023-03-21: 4 mg via INTRAVENOUS

## 2023-03-21 MED ORDER — DROPERIDOL 2.5 MG/ML IJ SOLN
INTRAMUSCULAR | Status: AC
  Filled 2023-03-21: qty 2

## 2023-03-21 MED ORDER — FENTANYL CITRATE (PF) 100 MCG/2ML IJ SOLN
INTRAMUSCULAR | Status: DC | PRN
  Administered 2023-03-21 (×2): 50 ug via INTRAVENOUS

## 2023-03-21 MED ORDER — GENTAMICIN SULFATE 40 MG/ML IJ SOLN
INTRAMUSCULAR | Status: DC | PRN
  Administered 2023-03-21: 240 mg

## 2023-03-21 MED ORDER — PHENYLEPHRINE HCL-NACL 20-0.9 MG/250ML-% IV SOLN
INTRAVENOUS | Status: AC
  Filled 2023-03-21: qty 250

## 2023-03-21 MED ORDER — PROPOFOL 10 MG/ML IV BOLUS
INTRAVENOUS | Status: AC
  Filled 2023-03-21: qty 20

## 2023-03-21 MED ORDER — ROCURONIUM BROMIDE 100 MG/10ML IV SOLN
INTRAVENOUS | Status: DC | PRN
  Administered 2023-03-21: 10 mg via INTRAVENOUS
  Administered 2023-03-21: 50 mg via INTRAVENOUS
  Administered 2023-03-21: 20 mg via INTRAVENOUS

## 2023-03-21 MED ORDER — OXYCODONE HCL 5 MG PO TABS
ORAL_TABLET | ORAL | Status: AC
  Filled 2023-03-21: qty 1

## 2023-03-21 MED ORDER — LIDOCAINE HCL (PF) 2 % IJ SOLN
INTRAMUSCULAR | Status: AC
  Filled 2023-03-21: qty 5

## 2023-03-21 MED ORDER — FENTANYL CITRATE (PF) 100 MCG/2ML IJ SOLN
25.0000 ug | INTRAMUSCULAR | Status: DC | PRN
  Administered 2023-03-21 (×2): 25 ug via INTRAVENOUS

## 2023-03-21 MED ORDER — PHENYLEPHRINE HCL (PRESSORS) 10 MG/ML IV SOLN
INTRAVENOUS | Status: DC | PRN
  Administered 2023-03-21 (×2): 80 ug via INTRAVENOUS
  Administered 2023-03-21 (×2): 160 ug via INTRAVENOUS

## 2023-03-21 MED ORDER — ROCURONIUM BROMIDE 10 MG/ML (PF) SYRINGE
PREFILLED_SYRINGE | INTRAVENOUS | Status: AC
  Filled 2023-03-21: qty 10

## 2023-03-21 MED ORDER — OXYCODONE HCL 5 MG PO TABS: 5.0000 mg | ORAL_TABLET | Freq: Once | ORAL | Status: DC | PRN

## 2023-03-21 MED ORDER — EPHEDRINE SULFATE (PRESSORS) 50 MG/ML IJ SOLN
INTRAMUSCULAR | Status: DC | PRN
Start: 2023-03-21 — End: 2023-03-21
  Administered 2023-03-21: 10 mg via INTRAVENOUS

## 2023-03-21 MED ORDER — METOPROLOL TARTRATE 5 MG/5ML IV SOLN
INTRAVENOUS | Status: DC | PRN
Start: 2023-03-21 — End: 2023-03-21
  Administered 2023-03-21: 3 mg via INTRAVENOUS
  Administered 2023-03-21: 2 mg via INTRAVENOUS

## 2023-03-21 MED ORDER — PHENYLEPHRINE HCL-NACL 20-0.9 MG/250ML-% IV SOLN
INTRAVENOUS | Status: DC | PRN
Start: 2023-03-21 — End: 2023-03-21
  Administered 2023-03-21: 25 ug/min via INTRAVENOUS

## 2023-03-21 MED ORDER — PROPOFOL 10 MG/ML IV BOLUS
INTRAVENOUS | Status: DC | PRN
  Administered 2023-03-21: 100 mg via INTRAVENOUS

## 2023-03-21 MED ORDER — VANCOMYCIN HCL 1000 MG IV SOLR
INTRAVENOUS | Status: DC | PRN
Start: 2023-03-21 — End: 2023-03-21
  Administered 2023-03-21: 1000 mg

## 2023-03-21 MED ORDER — VANCOMYCIN HCL 1250 MG/250ML IV SOLN
1250.0000 mg | INTRAVENOUS | Status: DC
  Administered 2023-03-23: 1250 mg via INTRAVENOUS
  Filled 2023-03-21: qty 250

## 2023-03-21 MED ORDER — FENTANYL CITRATE (PF) 100 MCG/2ML IJ SOLN
INTRAMUSCULAR | Status: AC
  Filled 2023-03-21: qty 2

## 2023-03-21 MED ORDER — ACETAMINOPHEN 10 MG/ML IV SOLN
INTRAVENOUS | Status: AC
  Filled 2023-03-21: qty 100

## 2023-03-21 MED ORDER — OXYCODONE HCL 5 MG PO TABS
5.0000 mg | ORAL_TABLET | Freq: Once | ORAL | Status: AC
  Administered 2023-03-21: 5 mg via ORAL

## 2023-03-21 MED ORDER — METOPROLOL TARTRATE 5 MG/5ML IV SOLN
INTRAVENOUS | Status: AC
  Filled 2023-03-21: qty 5

## 2023-03-21 MED ORDER — SUGAMMADEX SODIUM 200 MG/2ML IV SOLN
INTRAVENOUS | Status: DC | PRN
  Administered 2023-03-21: 200 mg via INTRAVENOUS

## 2023-03-21 MED ORDER — DROPERIDOL 2.5 MG/ML IJ SOLN
0.6250 mg | Freq: Once | INTRAMUSCULAR | Status: AC
  Administered 2023-03-21: 0.625 mg via INTRAVENOUS

## 2023-03-21 MED ORDER — DEXAMETHASONE SODIUM PHOSPHATE 10 MG/ML IJ SOLN
INTRAMUSCULAR | Status: DC | PRN
  Administered 2023-03-21: 10 mg via INTRAVENOUS

## 2023-03-21 MED ORDER — OXYCODONE HCL 5 MG/5ML PO SOLN: 5.0000 mg | Freq: Once | ORAL | Status: DC | PRN

## 2023-03-21 MED ORDER — ACETAMINOPHEN 10 MG/ML IV SOLN
INTRAVENOUS | Status: DC | PRN
  Administered 2023-03-21: 1000 mg via INTRAVENOUS

## 2023-03-21 MED ORDER — LACTATED RINGERS IV SOLN: INTRAVENOUS | Status: DC | PRN

## 2023-03-21 MED ORDER — LIDOCAINE HCL (CARDIAC) PF 100 MG/5ML IV SOSY
PREFILLED_SYRINGE | INTRAVENOUS | Status: DC | PRN
  Administered 2023-03-21: 100 mg via INTRAVENOUS

## 2023-03-21 MED ORDER — SODIUM CHLORIDE 0.9 % IV SOLN: 10.0000 mL/h | Freq: Once | INTRAVENOUS | Status: DC

## 2023-03-21 MED ORDER — DEXAMETHASONE SODIUM PHOSPHATE 10 MG/ML IJ SOLN
INTRAMUSCULAR | Status: AC
  Filled 2023-03-21: qty 1

## 2023-03-21 SURGICAL SUPPLY — 42 items
BNDG CMPR STD VLCR NS LF 5.8X3 (GAUZE/BANDAGES/DRESSINGS)
BNDG ELASTIC 3X5.8 VLCR NS LF (GAUZE/BANDAGES/DRESSINGS) ×1 IMPLANT
BNDG ESMARCH 4 X 12 STRL LF (GAUZE/BANDAGES/DRESSINGS) ×1
BNDG ESMARCH 4X12 STRL LF (GAUZE/BANDAGES/DRESSINGS) ×1 IMPLANT
CUFF TOURN SGL QUICK 18X4 (TOURNIQUET CUFF) ×1 IMPLANT
CUFF TOURN SGL QUICK 24 (TOURNIQUET CUFF)
CUFF TRNQT CYL 24X4X16.5-23 (TOURNIQUET CUFF) IMPLANT
DRSG AQUACEL AG ADV 3.5X14 (GAUZE/BANDAGES/DRESSINGS) IMPLANT
DRSG NON-ADHERENT DERMACEA 3X4 (GAUZE/BANDAGES/DRESSINGS) ×1 IMPLANT
DURAPREP 26ML APPLICATOR (WOUND CARE) ×1 IMPLANT
ELECT REM PT RETURN 9FT ADLT (ELECTROSURGICAL) ×1
ELECTRODE REM PT RTRN 9FT ADLT (ELECTROSURGICAL) ×1 IMPLANT
EVACUATOR 1/8 PVC DRAIN (DRAIN) IMPLANT
GAUZE SPONGE 4X4 12PLY STRL (GAUZE/BANDAGES/DRESSINGS) ×1 IMPLANT
GLOVE BIOGEL M STRL SZ7.5 (GLOVE) ×1 IMPLANT
GLOVE SRG 8 PF TXTR STRL LF DI (GLOVE) ×1 IMPLANT
GLOVE SURG UNDER POLY LF SZ8 (GLOVE) ×1
GOWN STRL REUS W/ TWL LRG LVL3 (GOWN DISPOSABLE) ×2 IMPLANT
GOWN STRL REUS W/TWL LRG LVL3 (GOWN DISPOSABLE) ×2
HEAD M SROM 36MM PLUS 1.5 (Hips) IMPLANT
KIT STIMULAN RAPID CURE 5CC (Orthopedic Implant) IMPLANT
KIT TURNOVER CYSTO (KITS) ×1 IMPLANT
LINER MARATHON 10D 36X54 (Hips) IMPLANT
LINER MARATHON 10DEG 36X54 (Hips) ×1 IMPLANT
MANIFOLD NEPTUNE II (INSTRUMENTS) ×1 IMPLANT
NS IRRIG 500ML POUR BTL (IV SOLUTION) ×1 IMPLANT
PACK EXTREMITY ARMC (MISCELLANEOUS) ×1 IMPLANT
PACK HIP COMPR (MISCELLANEOUS) IMPLANT
PAD CAST 3X4 CTTN HI CHSV (CAST SUPPLIES) ×1 IMPLANT
PAD PREP OB/GYN DISP 24X41 (PERSONAL CARE ITEMS) ×1 IMPLANT
PADDING CAST COTTON 3X4 STRL (CAST SUPPLIES)
SOLUTION IRRIG SURGIPHOR (IV SOLUTION) IMPLANT
SROM M HEAD 36MM PLUS 1.5 (Hips) ×1 IMPLANT
STAPLER SKIN PROX 35W (STAPLE) IMPLANT
STOCKINETTE 48X4 2 PLY STRL (GAUZE/BANDAGES/DRESSINGS) ×1 IMPLANT
STOCKINETTE STRL 4IN 9604848 (GAUZE/BANDAGES/DRESSINGS) ×1 IMPLANT
SUT ETHILON 4 0 P 3 18 (SUTURE) ×1 IMPLANT
SUT PDS AB 1 CT1 36 (SUTURE) IMPLANT
SUT PDS AB 2-0 CT1 27 (SUTURE) IMPLANT
TRAP FLUID SMOKE EVACUATOR (MISCELLANEOUS) ×1 IMPLANT
WAND COBLATION FLOW 50 (SURGICAL WAND) IMPLANT
WATER STERILE IRR 500ML POUR (IV SOLUTION) ×1 IMPLANT

## 2023-03-21 NOTE — Plan of Care (Signed)
  Problem: Fluid Volume: Goal: Hemodynamic stability will improve Outcome: Progressing   Problem: Clinical Measurements: Goal: Signs and symptoms of infection will decrease Outcome: Progressing   Problem: Respiratory: Goal: Ability to maintain adequate ventilation will improve Outcome: Progressing   Problem: Education: Goal: Knowledge of General Education information will improve Description: Including pain rating scale, medication(s)/side effects and non-pharmacologic comfort measures Outcome: Progressing   Problem: Health Behavior/Discharge Planning: Goal: Ability to manage health-related needs will improve Outcome: Progressing   Problem: Clinical Measurements: Goal: Ability to maintain clinical measurements within normal limits will improve Outcome: Progressing   Problem: Nutrition: Goal: Adequate nutrition will be maintained Outcome: Progressing   Problem: Coping: Goal: Level of anxiety will decrease Outcome: Progressing   Problem: Pain Managment: Goal: General experience of comfort will improve Outcome: Progressing   Problem: Safety: Goal: Ability to remain free from injury will improve Outcome: Progressing   Problem: Skin Integrity: Goal: Risk for impaired skin integrity will decrease Outcome: Progressing

## 2023-03-21 NOTE — Anesthesia Preprocedure Evaluation (Addendum)
Anesthesia Evaluation  Patient identified by MRN, date of birth, ID band Patient awake    Reviewed: Allergy & Precautions, NPO status , Patient's Chart, lab work & pertinent test results  History of Anesthesia Complications (+) PONV and history of anesthetic complications  Airway Mallampati: III  TM Distance: <3 FB Neck ROM: full    Dental  (+) Chipped, Upper Dentures   Pulmonary neg shortness of breath, former smoker    + decreased breath sounds      Cardiovascular Exercise Tolerance: Good hypertension, (-) angina + CAD, + Past MI, + Cardiac Stents and +CHF  + dysrhythmias Atrial Fibrillation  Rhythm:irregular Rate:Normal     Neuro/Psych  PSYCHIATRIC DISORDERS       Neuromuscular disease CVA    GI/Hepatic negative GI ROS, Neg liver ROS,,,  Endo/Other  diabetes, Type 2    Renal/GU      Musculoskeletal  (+) Arthritis ,    Abdominal   Peds  Hematology negative hematology ROS (+)   Anesthesia Other Findings Patient was optimized by medical team pre operatively  Past Medical History: No date: Anxiety     Comment:  a.) on BZO (alprazolam) PRN No date: Arthritis     Comment:  fingers, right hip 11/10/2013: Atrial fibrillation (HCC)     Comment:  a.) CHA2DS2VASc = 5 (age x2, sex, HTN, T2DM);  b.)               rate/rhythm maintained on oral metoprolol tartrate;               chronically anticoagulated with apixaban No date: Bronchitis No date: Cerebrovascular small vessel disease (chronic) 01/11/2021: Coronary artery disease involving native coronary artery  of native heart     Comment:  a.) LHC 01/11/2021: 40% oLM, 50% mLCx, 30% oLCx, 30%               pLAD, 30% mLAD, 99% pRCA (2.75 x 30 mm Resolute Onyx               DES), 75% p-mRCA, 60% dRCA 12/01/2013: DDD (degenerative disc disease), lumbar 01/12/2021: Diastolic dysfunction     Comment:  a.) TTE 01/12/2021 (s/p inf STEMI): EF 55-60%, mild-mod                LAE, mild RAE, mod RVE, mild AR/MR, mod TR, G1DD No date: Diet-controlled type 2 diabetes mellitus (HCC) No date: Full dentures No date: Gout 2021: History of bilateral cataract extraction No date: Hyperlipidemia No date: Hypertension, essential No date: Long term current use of anticoagulant     Comment:  a.) apixaban No date: PONV (postoperative nausea and vomiting) 11/10/2013: Sinoatrial node dysfunction (HCC) 01/11/2021: ST elevation myocardial infarction (STEMI) of inferior  wall (HCC)     Comment:  a.) LHC/PCI 01/11/2021 --> culprit lesion 99% pRCA -->               2.75 x 30 mm Resolute Onyx DES x 1 No date: Varicose veins of legs No date: Wears hearing aid in both ears  Past Surgical History: 2011: BREAST BIOPSY; Left     Comment:  NEG No date: CARDIAC CATHETERIZATION     Comment:  over 20 yrs ago.  All OK. 02/25/2023: CAROTID PTA/STENT INTERVENTION; Left     Comment:  Procedure: CAROTID PTA/STENT INTERVENTION;  Surgeon:               Annice Needy, MD;  Location: [facility] INVASIVE CV LAB;  Service: Cardiovascular;  Laterality: Left; 03/27/2020: CATARACT EXTRACTION W/PHACO; Left     Comment:  Procedure: CATARACT EXTRACTION PHACO AND INTRAOCULAR               LENS PLACEMENT (IOC) LEFT 7.49  00:56.4  13.3%;  Surgeon:              Lockie Mola, MD;  Location: [facility];              Service: Ophthalmology;  Laterality: Left; 04/24/2020: CATARACT EXTRACTION W/PHACO; Right     Comment:  Procedure: CATARACT EXTRACTION PHACO AND INTRAOCULAR               LENS PLACEMENT (IOC) RIGHT;  Surgeon: Lockie Mola, MD;  Location: [facility];  Service:               Ophthalmology;  Laterality: Right;  8.34 0:50.2 16.6% 01/11/2021: CORONARY/GRAFT ACUTE MI REVASCULARIZATION; N/A     Comment:  Procedure: Coronary/Graft Acute MI Revascularization;                Surgeon: Marcina Millard, MD;  Location: [facility]                INVASIVE CV LAB;  Service: Cardiovascular;  Laterality:               N/A; 01/11/2021: LEFT HEART CATH AND CORONARY ANGIOGRAPHY; N/A     Comment:  Procedure: LEFT HEART CATH AND CORONARY ANGIOGRAPHY;                Surgeon: Marcina Millard, MD;  Location: [facility]               INVASIVE CV LAB;  Service: Cardiovascular;  Laterality:               N/A; No date: PARTIAL HYSTERECTOMY No date: TOE SURGERY; Left     Comment:  great toe; rod in place 01/18/2023: TOTAL HIP ARTHROPLASTY; Right     Comment:  Procedure: TOTAL HIP ARTHROPLASTY;  Surgeon: Donato Heinz, MD;  Location: [facility] ORS;  Service: Orthopedics;               Laterality: Right; 2007: TOTAL KNEE ARTHROPLASTY; Right  BMI    Body Mass Index: 34.52 kg/m      Reproductive/Obstetrics negative OB ROS                             Anesthesia Physical Anesthesia Plan  ASA: 4  Anesthesia Plan: General ETT   Post-op Pain Management:    Induction: Intravenous  PONV Risk Score and Plan: Ondansetron, Dexamethasone, Midazolam and Treatment may vary due to age or medical condition  Airway Management Planned: Oral ETT  Additional Equipment:   Intra-op Plan:   Post-operative Plan: Extubation in OR  Informed Consent: I have reviewed the patients History and Physical, chart, labs and discussed the procedure including the risks, benefits and alternatives for the proposed anesthesia with the patient or authorized representative who has indicated his/her understanding and acceptance.     Dental Advisory Given  Plan Discussed with: Anesthesiologist, CRNA and Surgeon  Anesthesia Plan Comments: (Consented for increased risk for MI and or stroke 2/2 recent medical history, patient voiced assent.  Patient consented for risks of anesthesia including  but not limited to:  - adverse reactions to medications - damage to eyes, teeth, lips or other oral mucosa - nerve damage due to positioning  -  sore throat or hoarseness - Damage to heart, brain, nerves, lungs, other parts of body or loss of life  Patient voiced understanding.)       Anesthesia Quick Evaluation

## 2023-03-21 NOTE — Progress Notes (Signed)
Patient returned from surgery to right hip. Hemovac drains applied to right hip area. 84F and 85F deep and superficial. Completed blood transfusion around 5:50pm. No s/s of distress or SOB noted. Repositioned in bed for comfort. Family at bedside currently.

## 2023-03-21 NOTE — Plan of Care (Signed)

## 2023-03-21 NOTE — Anesthesia Postprocedure Evaluation (Signed)
Anesthesia Post Note  Patient: NEOMI VIVEROS  Procedure(s) Performed: RIGHT HIP IRRIGATION AND DEBRIDEMENT OF INFECTED TOTAL JOINT, EXCHANGE OF HEAD AND POLY COMPONENTS WITH PLACEMENT OF ANTIBIOTIC BEADS (Right)  Patient location during evaluation: PACU Anesthesia Type: General Level of consciousness: awake and alert Pain management: pain level controlled Vital Signs Assessment: post-procedure vital signs reviewed and stable Respiratory status: spontaneous breathing, nonlabored ventilation, respiratory function stable and patient connected to nasal cannula oxygen Cardiovascular status: blood pressure returned to baseline and stable Postop Assessment: no apparent nausea or vomiting Anesthetic complications: no   No notable events documented.   Last Vitals:  Vitals:   03/21/23 1545 03/21/23 1600  BP: (!) 98/57 (!) 109/52  Pulse:    Resp: 10 (!) 8  Temp: (!) 36.1 C   SpO2:      Last Pain:  Vitals:   03/21/23 1545  TempSrc:   PainSc: 3                  Cleda Mccreedy Destane Speas

## 2023-03-21 NOTE — Op Note (Signed)
03/21/2023  2:39 PM  PATIENT:  Brooke Davis  82 y.o. female  PRE-OPERATIVE DIAGNOSIS:  RIGHT HIP TOTAL JOINT INFECTION  POST-OPERATIVE DIAGNOSIS:  RIGHT HIP TOTAL JOINT INFECTION  PROCEDURE:  Procedure(s): RIGHT HIP IRRIGATION AND DEBRIDEMENT OF INFECTED TOTAL JOINT, EXCHANGE OF HEAD AND POLY COMPONENTS WITH PLACEMENT OF ANTIBIOTIC BEADS (Right)  SURGEON:  Surgeons and Role:    * Annastacia Duba, Adelfa Koh, MD - Primary  PHYSICIAN ASSISTANT:   ASSISTANTS: none   ANESTHESIA:   general  EBL:  100   BLOOD ADMINISTERED:none  DRAINS: 2 HV drains placed: superficial and deep  LOCAL MEDICATIONS USED:  polymixin and neosporin in irrigation, vanco/gent Abx beads in deep and superficial layers, betadine irrigation as well  SPECIMEN:  No Specimen  DISPOSITION OF SPECIMEN:  N/A  COUNTS:  YES  TOURNIQUET:  * No tourniquets in log *  DICTATION: .Dragon Dictation  PLAN OF CARE: Admit to inpatient   PATIENT DISPOSITION:  PACU - hemodynamically stable.   Delay start of Pharmacological VTE agent (>24hrs) due to surgical blood loss or risk of bleeding: yes  Explants: Poly Head  Implants:  Poly: +22mm 10 degree pinnacle marathon polyethylene insert Head: 36mm M-Spec +1.1mm hip ball I used the exact same sizes of what was explanted   Description of Procedure:  The patient and the patient's daughters and I spoke in the preoperative area prior to her surgery.  The MRI from yesterday showed complex fluid collection superficial and deep and concerning for infection.  Patient and family understand the surgical indication and the plan for washout and drainage of the infection with possible exchange of arthroplasty components.  They understand there is no guarantee of success of the operation if the infection does not clear then additional more complicated surgery would be considered, even two-stage revision arthroplasty.  All questions were answered.  I explained that this is not a mild  problem but this is a high risk problem and risks such as infection and bleeding and blood clots and immediate injury to nearby structures and fracture and hardware complications and arthroplasty component complications and bleeding and any of these problems can lead to increased morbidity and even mortality.  She recently had stents and was placed on blood thinners this sets her up for increased risk of bleeding and overall this is a high risk clinical situation with complex injury.  Patient and family understand that.  They desiring to proceed with surgery.  After our long conversation after answered all questions consent was obtained and the patient was checked in by the rest of the surgical team.  At this point the patient was then taken back into surgery.  In surgery the patient was given anesthesia.  The patient was positioned in the left lateral decubitus position.  Antibiotics were given.  A surgical pause was done after the left lower extremity was prepped and draped in the standard sterile fashion.  A surgical pause was done.  The case then began  I used the localization of the swelling clinically and what was seen on the imaging to center my incision in that area.  I used much of the prior incision but not the entirety of the prior incision particularly the very proximal aspect.  Skin flaps were elevated and underneath the dermal layer there was a collection of fluid.  It immediately came out and was consistent with a pocket of fluid on the MRI and the superficial compartment.  The fluid was very runny and brown-tinged and  have the consistency of water.  This was possibly an infected seroma.  There was not frank pus or milky thick fluid.  The fluid was drained and sent for Gram stain culture evaluation and crystals.  The superficial layer was washed out provisionally with a pulse irrigator.  A defect in the fascia of about 2 cm was noted in the fluid continued straight down to the joint surface  without any barrier in between.  This fluid was clearly extending deeply and there was no isolated superficial compartment of seroma or infection.  It was 1 continuous trance compartment process including deep and superficial areas.  Because the fluid was intimately associated with the hip joint I did make the decision for a poly exchange and femoral head exchange as well.  Started with irrigation and debridement of the superficial layer.  We opened up the fascia we exposed everything and sharp excisional debridement was done with scalpel curette and rongeurs.  Skin subcutaneous tissue fascia bursal tissue and deep capsular tissue was debrided sharply.  There was no capsular enclosed space.  There was not a piriformis present and the joint surface was open and communicating to the deep and superficial layers.  I did debride some Ethibond appearing sutures in the deep layer that may have been used for capsular repair but they certainly were not doing that at this point.  We used the pulse irrigator and the fluid was impregnated with Neosporin and polymyxin, in total 12 L of fluid was irrigated through after the sharp excisional debridement.  In general the fluid was not discolored it was red and pink and healthy in appearance.  All remnants of the seroma and the tissue surrounding the seroma and any bland looking tissue that was not healthy was excisionally removed.  Additional irrigation with normal saline from the back table was done for 2 additional liters.  This was 14 L of irrigation and debridement.  Initially 3 L was used for the debridement.  The polyethylene was removed the femoral head was removed and we continue the irrigation and debridement.  The screw holes of the acetabular cup were debrided with a curette.  Soft tissue around the acetabulum cup was debrided.  We had good exposure.  We kept irrigating and debriding we did this for a total of 14 L.  At this point and prior to the irrigation we  sent both superficial and deep cultures.  Now we are ready to put the implants back in.  The polyethylene liner was prepared put into position and once it was aligned it was impacted.  I confirmed that the polyethylene nobs were seated flush in the appropriate recessed part of the acetabular cup circumferentially.  I was able to grab the polyethylene but not remove it as it was very snug and we were happy with the seating of the new polyethylene.  The orientation of the posterior liner was replaced to that where it was originally.  The femoral head was impacted on.  We reduced the hip at this time.  The hip was stable.  The hip stability exam was similar to as it was preoperatively.  With internal rotation and adduction the hip was stable to about 90 degrees.  We continued some additional irrigation.  At this point I started closure.  The deep capsular layer was closed with #2 Ethibond suture.  Vancomycin powder was placed as well as antibiotic beads that were impregnated with vancomycin and gentamicin.  The fascia was closed with PDS  suture.  A deep drain was placed.  The skin was closed with PDS suture.  A superficial drain was placed.  The skin was closed with PDS and staples at the surface.  A bandage was placed.  An island barrier bandage was placed.  The patient tolerated the procedure well.  There were no complications.  Postoperative orders were placed. The patient will be seen by the therapy team, she is weightbearing as tolerated with posterior hip precautions. Nursing team will monitor and record the drain outputs. Drains to stay in place until the output is less than 20 per 24-hour shift. Postop x-ray ordered, AP pelvis  Leo Weyandt, ortho

## 2023-03-21 NOTE — Anesthesia Procedure Notes (Signed)
Procedure Name: Intubation Date/Time: 03/21/2023 11:07 AM  Performed by: Irving Burton, CRNAPre-anesthesia Checklist: Patient identified, Patient being monitored, Timeout performed, Emergency Drugs available and Suction available Patient Re-evaluated:Patient Re-evaluated prior to induction Oxygen Delivery Method: Circle system utilized Preoxygenation: Pre-oxygenation with 100% oxygen Induction Type: IV induction Ventilation: Mask ventilation without difficulty Laryngoscope Size: 3 and McGraph Grade View: Grade I Tube type: Oral Tube size: 7.0 mm Number of attempts: 1 Airway Equipment and Method: Stylet and Video-laryngoscopy Placement Confirmation: ETT inserted through vocal cords under direct vision, positive ETCO2 and breath sounds checked- equal and bilateral Secured at: 21 cm Tube secured with: Tape Dental Injury: Teeth and Oropharynx as per pre-operative assessment

## 2023-03-21 NOTE — Progress Notes (Signed)
1      PROGRESS NOTE    Brooke Davis  UJW:[id number] DOB: 1941-04-19 DOA: 03/18/2023 PCP: [name], MD    Brief Narrative:   82 y.o. female with medical history significant for DM2, HTN, CAD s/p PCI and stent, dCHF A fib on Eliquis, and anxiety hospitalized in July 2024 with an acute embolic stroke while on Eliquis, with workup showing left carotid artery stenosis s/p L carotid stent placement on 02/25/23 and undergoing  total right hip replacement a couple weeks later on 03/09/23  who presents to the ED with a 3-day history of fever, nausea, dizziness and diarrhea.  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  Status post right hip irrigation and debridement on 03/21/2023 by orthopedic surgery Dr. Clemencia Course.  03/21/2023: The patient was seen and examined at bedside.  She denies having any significant pain at the time of this visit.  On as needed analgesics and broad-spectrum IV antibiotics IV vancomycin and cefepime.  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 Clemencia Course Sepsis criteria included fever and tachycardia.   Sepsis physiology is improving.  WBC normal, lactic acid normal Suspecting postop infection related to right hip replacement  - Continue to follow-up peripheral blood cultures x 2. - continue vancomycin and cefepime, follow ID and sensitivities from cultures and narrow down antibiotics as able. Continue to maintain MAP greater than 65.   Hx of total hip  arthroplasty, 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  Antiplatelet NWC on hold.   Restart DOAC and antiplatelet within 48 hours post surgical intervention if able, as recommended by vascular surgery.  Anticipate restart date no longer than 03/23/2023.   Chronic diastolic CHF (congestive heart failure) (HCC) Clinically euvolemic Hold Entresto, metoprolol and torsemide due to soft BPs Monitor for fluid overload in view of IV fluid resuscitation in the management of suspected sepsis Continue strict I's and O's and daily weight   Atrial fibrillation, chronic (HCC) Chronic anticoagulation Currently rate controlled Off Eliquis for 48 hours On metoprolol on hold to avoid hypotension due to soft BPs   CAD S/P percutaneous coronary angioplasty Continue atorvastatin and metoprolol   Anxiety and depression Continue citalopram and alprazolam   Primary hypertension Hold off home oral antihypertensives due to soft BPs Continue to closely monitor vital signs.  Goals of care: Currently full code.  Time: 50 minutes.  DVT prophylaxis:  apixaban (ELIQUIS) tablet 5 mg     Code Status: (Full code Family Communication: (Updated daughter and remaining family numbers at the bedside Disposition Plan: Home with home health versus SNF depending on clinical progress   Consultants:  Orthopedics Curb sided with vascular surgery, Dr. Wyn Quaker.    Antimicrobials:  IV cefepime and vancomycin   Subjective: Patient is very hard of hearing, reports minimal  pain  Objective: Vitals:   03/21/23 0937 03/21/23 1415 03/21/23 1430 03/21/23 1458  BP: (!) 137/53 106/86 99/65 (!) 99/54  Pulse: 75   87  Resp:  16 12 16   Temp:  (!) 97 F (36.1 C)  (!) 97 F (36.1 C)  TempSrc:     Temporal  SpO2: 97%     Weight:      Height:        Intake/Output Summary (Last 24 hours) at 03/21/2023 1502 Last data filed at 03/21/2023 1316 Gross per 24 hour  Intake 809.88 ml  Output --  Net 809.88 ml   Filed Weights   03/18/23 2122 03/19/23 0753 03/21/23 0500  Weight: 81.2 kg 83.8 kg 85.6 kg    Examination:  General exam: Appears calm and comfortable.  Interactive.  In no acute distress. Respiratory system: Clear to auscultation. Respiratory effort normal. Cardiovascular system: S1 & S2 heard,  Regular rate and rhythm no rubs or gallops.  No JVD or thyromegaly. Gastrointestinal system: Abdomen is nondistended, soft and nontender. No organomegaly or masses felt. Normal bowel sounds heard. Central nervous system: Alert and oriented.  No focal neurological deficits. Extremities: Well-healed surgical wound on the right hip, minimal erythema and warmth/induration around distal aspect of right hip Skin: As above listed in the extremities Psychiatry: Judgement and insight appear normal. Mood & affect appropriate.     Data Reviewed: I have personally reviewed following labs and imaging studies  CBC: Recent Labs  Lab 03/18/23 2134 03/20/23 0605 03/21/23 0433  WBC 9.8 7.8 8.4  NEUTROABS 8.8*  --   --   HGB 9.1* 8.2* 8.6*  HCT 27.9* 25.6* 26.6*  MCV 92.1 93.1 92.0  PLT 290 218 250   Basic Metabolic Panel: Recent Labs  Lab 03/18/23 2216 03/20/23 0605 03/21/23 0433  NA 134* 136 134*  K 3.3* 3.6 3.5  CL 96* 103 100  CO2 26 26 26   GLUCOSE 146* 155* 111*  BUN 18 17 17   CREATININE 1.27* 1.17* 1.17*  CALCIUM 8.7* 8.3* 8.3*  MG  --  1.4*  --    GFR: Estimated Creatinine Clearance: 38.9 mL/min (A) (by C-G formula based on SCr of 1.17 mg/dL (H)). Liver Function Tests: Recent Labs  Lab 03/18/23 2216  AST 19  ALT 12  ALKPHOS 85  BILITOT 0.9  PROT 6.5  ALBUMIN 2.9*   No results for input(s): "LIPASE", "AMYLASE" in the last 168 hours. No results for input(s):  "AMMONIA" in the last 168 hours. Coagulation Profile: Recent Labs  Lab 03/18/23 2216  INR 2.2*   Sepsis Labs: Recent Labs  Lab 03/18/23 2134 03/19/23 0142  PROCALCITON  --  0.46  LATICACIDVEN 1.7 1.2    Recent Results (from the past 240 hour(s))  Blood Culture (routine x 2)     Status: None (Preliminary result)   Collection Time: 03/18/23  9:34 PM   Specimen: BLOOD RIGHT HAND  Result Value Ref Range Status   Specimen Description BLOOD RIGHT HAND  Final   Special Requests   Final    BOTTLES DRAWN AEROBIC ONLY Blood Culture adequate volume   Culture   Final    NO GROWTH 3 DAYS Performed at [facility], [address], [place], [place] [address]    Report Status PENDING  Incomplete  Blood Culture (routine x 2)     Status: None (Preliminary result)   Collection Time: 03/18/23  9:36 PM   Specimen: BLOOD LEFT HAND  Result Value Ref Range Status  Specimen Description BLOOD LEFT HAND  Final   Special Requests   Final    BOTTLES DRAWN AEROBIC AND ANAEROBIC Blood Culture adequate volume   Culture   Final    NO GROWTH 3 DAYS Performed at [facility], [address], [place], [place] [address]    Report Status PENDING  Incomplete  Resp panel by RT-PCR (RSV, Flu A&B, Covid) Anterior Nasal Swab     Status: None   Collection Time: 03/18/23 10:28 PM   Specimen: Anterior Nasal Swab  Result Value Ref Range Status   SARS Coronavirus 2 by RT PCR NEGATIVE NEGATIVE Final    Comment: (NOTE) SARS-CoV-2 target nucleic acids are NOT DETECTED.  The SARS-CoV-2 RNA is generally detectable in upper respiratory specimens during the acute phase of infection. The lowest concentration of SARS-CoV-2 viral copies this assay can detect is 138 copies/mL. A negative result does not preclude SARS-Cov-2 infection and should not be used as the sole basis for treatment or other patient management decisions. A negative result may occur with  improper specimen collection/handling,  submission of specimen other than nasopharyngeal swab, presence of viral mutation(s) within the areas targeted by this assay, and inadequate number of viral copies(<138 copies/mL). A negative result must be combined with clinical observations, patient history, and epidemiological information. The expected result is Negative.  Fact Sheet for Patients:  BloggerCourse.com  Fact Sheet for Healthcare Providers:  SeriousBroker.it  This test is no t yet approved or cleared by the [place] FDA and  has been authorized for detection and/or diagnosis of SARS-CoV-2 by FDA under an Emergency Use Authorization (EUA). This EUA will remain  in effect (meaning this test can be used) for the duration of the COVID-19 declaration under Section 564(b)(1) of the Act, 21 U.S.C.section 360bbb-3(b)(1), unless the authorization is terminated  or revoked sooner.       Influenza A by PCR NEGATIVE NEGATIVE Final   Influenza B by PCR NEGATIVE NEGATIVE Final    Comment: (NOTE) The Xpert Xpress SARS-CoV-2/FLU/RSV plus assay is intended as an aid in the diagnosis of influenza from Nasopharyngeal swab specimens and should not be used as a sole basis for treatment. Nasal washings and aspirates are unacceptable for Xpert Xpress SARS-CoV-2/FLU/RSV testing.  Fact Sheet for Patients: BloggerCourse.com  Fact Sheet for Healthcare Providers: SeriousBroker.it  This test is not yet approved or cleared by the [place] FDA and has been authorized for detection and/or diagnosis of SARS-CoV-2 by FDA under an Emergency Use Authorization (EUA). This EUA will remain in effect (meaning this test can be used) for the duration of the COVID-19 declaration under Section 564(b)(1) of the Act, 21 U.S.C. section 360bbb-3(b)(1), unless the authorization is terminated or revoked.     Resp Syncytial Virus by PCR NEGATIVE  NEGATIVE Final    Comment: (NOTE) Fact Sheet for Patients: BloggerCourse.com  Fact Sheet for Healthcare Providers: SeriousBroker.it  This test is not yet approved or cleared by the [place] FDA and has been authorized for detection and/or diagnosis of SARS-CoV-2 by FDA under an Emergency Use Authorization (EUA). This EUA will remain in effect (meaning this test can be used) for the duration of the COVID-19 declaration under Section 564(b)(1) of the Act, 21 U.S.C. section 360bbb-3(b)(1), unless the authorization is terminated or revoked.  Performed at [facility], [address], [place], [place] [address]   MRSA Next Gen by PCR, Nasal     Status: None   Collection Time: 03/19/23 11:02 AM  Specimen: Nasal Mucosa; Nasal Swab  Result Value Ref Range Status   MRSA by PCR Next Gen NOT DETECTED NOT DETECTED Final    Comment: (NOTE) The GeneXpert MRSA Assay (FDA approved for NASAL specimens only), is one component of a comprehensive MRSA colonization surveillance program. It is not intended to diagnose MRSA infection nor to guide or monitor treatment for MRSA infections. Test performance is not FDA approved in patients less than 18 years old. Performed at [facility], [address], [place], [place] [address]          Radiology Studies: MR [facility] RIGHT WO CONTRAST  Result Date: 03/20/2023 CLINICAL DATA:  Soft tissue infection suspected, thigh. Recent total right hip arthroplasty with new onset of pain and redness. EXAM: MRI OF THE RIGHT FEMUR WITHOUT CONTRAST TECHNIQUE: Multiplanar, multisequence MR imaging of the right femur was performed. No intravenous contrast was administered. COMPARISON:  Pelvis and right hip radiographs 03/20/2023 and 01/18/2023 FINDINGS: Bones/Joint/Cartilage There is metallic artifact from total right hip arthroplasty, obscuring portions of the proximal right femur. Within  limitations of this artifact, no definite bone marrow edema or cortical erosion is seen to indicate MRI evidence of acute osteomyelitis. Note is made of 2 right knee arthroplasty hardware and associated artifact. Moderate to severe left knee medial and lateral compartment joint space narrowing and peripheral osteophytosis degenerative changes. Moderate left femoroacetabular cartilage thinning, joint space narrowing, peripheral osteophytosis. No left hip joint effusion. Ligaments No gross ligamentous tear is seen. Muscles and Tendons There is moderate chronic fatty infiltration of the right gluteus minimus greater than right gluteus medius muscle bodies. No gross regional tendon tear is visualized. Soft tissues In the region of the soft tissue swelling and dots of low-density possible subcutaneous air seen on today's radiograph overlying the lateral right hemipelvis and proximal thigh subcutaneous fat, there is an oval decreased T1 and increased T2 signal collection with moderate internal septations within the deep aspect of the subcutaneous fat lateral to the proximal right femur, overall measuring up to 6.6 x 6.6 x 16.5 cm (transverse by AP by craniocaudal). This contains scattered small foci of decreased T1 and decreased T2 signal with blooming artifact suspicious for air and a gas producing organism. This complex collection appears to be connected through a small channel through the iliotibial band likely measuring up to 13 mm in AP dimension (axial series 17, image 18 and 20 mm in craniocaudal dimension (coronal series 10, image 18) with a more uniform and simple appearing decreased T1 and increased T2 signal fluid collection within the region of the right trochanteric bursa that wraps around the posterior aspect of the greater trochanter and intertrochanteric region and likely connects to the right femoroacetabular joint space (axial series 17, images 13-27 and coronal series 10 images 13 through 21). This more  simple fluid collection measures up to approximately 5.5 cm in greatest transverse dimension, 1.5 cm in greatest AP dimension, and 10 cm in craniocaudal dimension. There is moderate edema and swelling throughout the lateral right hemipelvis and proximal thigh subcutaneous fat. IMPRESSION: 1. Status post total right hip arthroplasty approximately 2 months ago. Complex collection with moderate internal septations within the deep aspect of the subcutaneous fat lateral to the proximal right femur, overall measuring up to 6.6 x 6.6 x 16.5 cm. This contains scattered small foci of air suspicious for a gas producing organism and is suspicious for an abscess. This complex collection appears to be connected through a small channel through the iliotibial band  with a more uniform and simple appearing fluid collection within the region of the right trochanteric bursa that wraps around the posterior aspect of the greater trochanter and likely connects to the right femoroacetabular joint space. 2. Within the limitations of total right hip arthroplasty metallic artifact, no MRI evidence of acute osteomyelitis. Electronically Signed   By: Neita Garnet M.D.   On: 03/20/2023 18:48   DG HIP UNILAT WITH PELVIS 2-3 VIEWS RIGHT  Result Date: 03/20/2023 CLINICAL DATA:  Right hip swelling. EXAM: DG HIP (WITH OR WITHOUT PELVIS) 2-3V RIGHT COMPARISON:  None Available. FINDINGS: Status post right total hip replacement. No evidence for periprosthetic fracture. There is soft tissue swelling over the lateral hip with multiple scattered tiny lucencies, likely edema but soft tissue gas not excluded. IMPRESSION: 1. Status post right total hip replacement without evidence for periprosthetic fracture. 2. Soft tissue swelling over the lateral hip with multiple scattered tiny lucencies, likely edema but soft tissue gas not excluded. CT scan of the hip recommended to exclude soft tissue gas. 3. These results will be called to the ordering clinician  or representative by the Radiologist Assistant, and communication documented in the PACS or [employer]. Electronically Signed   By: Kennith Center M.D.   On: 03/20/2023 16:28        Scheduled Meds:  [MAR Hold] allopurinol  300 mg Oral Daily   [MAR Hold] apixaban  5 mg Oral BID   [MAR Hold] atorvastatin  80 mg Oral Daily   [MAR Hold] citalopram  20 mg Oral Daily   [MAR Hold] clopidogrel  75 mg Oral Daily   [MAR Hold] metoprolol tartrate  25 mg Oral BID   [MAR Hold] sacubitril-valsartan  1 tablet Oral BID   [MAR Hold] torsemide  20 mg Oral Daily   Continuous Infusions:  sodium chloride     [MAR Hold] ceFEPime (MAXIPIME) IV 0 g (03/20/23 2343)   [MAR Hold] vancomycin       LOS: 3 days    Time spent: 35 minutes    Darlin Drop, MD [facility] Pager [phone]  If 7PM-7AM, please contact night-coverage [email or web address]  03/21/2023, 3:02 PM

## 2023-03-21 NOTE — Plan of Care (Signed)

## 2023-03-21 NOTE — Transfer of Care (Signed)
Immediate Anesthesia Transfer of Care Note  Patient: Brooke Davis  Procedure(s) Performed: RIGHT HIP IRRIGATION AND DEBRIDEMENT OF INFECTED TOTAL JOINT, EXCHANGE OF HEAD AND POLY COMPONENTS WITH PLACEMENT OF ANTIBIOTIC BEADS (Right)  Patient Location: PACU  Anesthesia Type:General  Level of Consciousness: awake, alert , and oriented  Airway & Oxygen Therapy: Patient Spontanous Breathing and Patient connected to face mask oxygen  Post-op Assessment: Report given to RN and Post -op Vital signs reviewed and stable  Post vital signs: Reviewed and stable  Last Vitals:  Vitals Value Taken Time  BP 106/86 03/21/23 1416  Temp    Pulse 86   Resp 10 03/21/23 1421  SpO2 98   Vitals shown include unfiled device data.  Last Pain:  Vitals:   03/20/23 2010  TempSrc:   PainSc: 0-No pain         Complications: No notable events documented.

## 2023-03-21 NOTE — Progress Notes (Addendum)
Orthopedic Surgery Progress Note  Subjective: No acute interval events   Objective: Chart reviewed. She had an MRI yesterday which shows large collection superficial and extending deep in the right hip area.  General Appearance: alert, in no distress, cooperative Heart: rrr Lungs: no labored breathing Extremities: Right hip exam is stable.  She has some swelling and fullness in the distal aspect of the incision from her prior surgery.  There is no open wounds the skin is intact.  The thigh is soft and compressible other than around the area of induration.  The exam is stable from yesterday.  Distally moving the ankle foot and toes well.  BCR.  Silt   Assessment/Plan: Brooke Davis is a 82 y.o. female who had a right total hip replacement January 18, 2023 and was readmitted on March 18, 2023 with sepsis and swelling in the right hip area.  She is currently hemodynamically stable.  The MRI shows a complex fluid collection.  I reviewed the imaging in detail.  I discussed the imaging with an [facility] radiologist I also discussed the case with Dr. Ernest Pine, primary surgeon.  Plan today for surgical washout of collection.  Plus or minus poly exchange, to be determined in surgery.  We are on-call to the OR.  Consent will be obtained when the daughter is present.  I did discuss the plan with the patient and she is in agreement with the plan.  Please keep NPO.  I spoke witih Brooke Davis and she is on her way in to discuss everything.  Should be here within the hour.   Signed: Nada Boozer, MD, Orthopedic Surgeon Locums

## 2023-03-21 NOTE — Progress Notes (Signed)
Pharmacy Antibiotic Note  Brooke Davis is a 82 y.o. female admitted on 03/18/2023 with sepsis.  Pharmacy has been consulted for Vanc, cefepime dosing.  Orthopedics was consulted d/t concern for swelling of patient's right hip area. Although exam was consistent for hematoma, possible infectious source. Would like to continue broad spectrum antibiotics for now until patient is taken to OR for I&D of abscess  Plan: Continue Cefepime 2 gm IV Q12H ordered to start on 8/30 @ 1130.  Vancomycin 1250 gm IV Q48H9(renal function slightly improved). Goal AUC 400-550. Expected AUC: 529 Expected Cmin: 11.0 SCr used: 1.17, Vd used: 0.5   Height: 5\' 2"  (157.5 cm) Weight: 85.6 kg (188 lb 11.4 oz) IBW/kg (Calculated) : 50.1  Temp (24hrs), Avg:98.4 F (36.9 C), Min:97.9 F (36.6 C), Max:98.8 F (37.1 C)  Recent Labs  Lab 03/18/23 2134 03/18/23 2216 03/19/23 0142 03/20/23 0605 03/21/23 0433  WBC 9.8  --   --  7.8 8.4  CREATININE  --  1.27*  --  1.17* 1.17*  LATICACIDVEN 1.7  --  1.2  --   --     Estimated Creatinine Clearance: 38.9 mL/min (A) (by C-G formula based on SCr of 1.17 mg/dL (H)).    Allergies  Allergen Reactions   Celebrex [Celecoxib] Diarrhea   Penicillins Itching    IgE = 154 (WNL) on 01/05/2023   Relafen [Nabumetone]     Palpitation    Lovastatin Diarrhea    Antimicrobials this admission: Vancomycin 8/30 >>  Cefepime   8/30 >>   Dose adjustments this admission:   Microbiology results:  BCx: NGTD  MRSA PCR: negative  Thank you for allowing pharmacy to be a part of this patient's care.  Seve Monette A Aniko Finnigan 03/21/2023 9:08 AM

## 2023-03-21 NOTE — Progress Notes (Signed)
PLEASE NOTE: The patient underwent right total hip arthroplasty on 01/18/2023 (NOT 03/09/2023 AS NOTED IN THE HOSPITALIST'S NOTES).  Kiernan Atkerson P. Makhari Dovidio,M.D.

## 2023-03-21 NOTE — Brief Op Note (Signed)
03/21/2023  2:39 PM  PATIENT:  Brooke Davis  82 y.o. female  PRE-OPERATIVE DIAGNOSIS:  RIGHT HIP TOTAL JOINT INFECTION  POST-OPERATIVE DIAGNOSIS:  RIGHT HIP TOTAL JOINT INFECTION  PROCEDURE:  Procedure(s): RIGHT HIP IRRIGATION AND DEBRIDEMENT OF INFECTED TOTAL JOINT, EXCHANGE OF HEAD AND POLY COMPONENTS WITH PLACEMENT OF ANTIBIOTIC BEADS (Right)  SURGEON:  Surgeons and Role:    * Britlee Skolnik, Adelfa Koh, MD - Primary  PHYSICIAN ASSISTANT:   ASSISTANTS: none   ANESTHESIA:   general  EBL:  100   BLOOD ADMINISTERED:none  DRAINS: 2 HV drains placed: superficial and deep  LOCAL MEDICATIONS USED:  polymixin and neosporin in irrigation, vanco/gent Abx beads in deep and superficial layers, betadine irrigation as well  SPECIMEN:  No Specimen  DISPOSITION OF SPECIMEN:  N/A  COUNTS:  YES  TOURNIQUET:  * No tourniquets in log *  DICTATION: .Dragon Dictation  PLAN OF CARE: Admit to inpatient   PATIENT DISPOSITION:  PACU - hemodynamically stable.   Delay start of Pharmacological VTE agent (>24hrs) due to surgical blood loss or risk of bleeding: yes  Explants: Poly Head  Implants:  Poly: +85mm 10 degree pinnacle marathon polyethylene insert Head: 36mm M-Spec +1.20mm hip ball I used the exact same sizes of what was explanted

## 2023-03-22 DIAGNOSIS — Z7189 Other specified counseling: Secondary | ICD-10-CM | POA: Diagnosis not present

## 2023-03-22 DIAGNOSIS — A419 Sepsis, unspecified organism: Secondary | ICD-10-CM | POA: Diagnosis not present

## 2023-03-22 LAB — SYNOVIAL CELL COUNT + DIFF, W/ CRYSTALS
Crystals, Fluid: NONE SEEN
Eosinophils-Synovial: 0 %
Lymphocytes-Synovial Fld: 3 %
Monocyte-Macrophage-Synovial Fluid: 1 %
Neutrophil, Synovial: 96 %
WBC, Synovial: 25030 /mm3 — ABNORMAL HIGH (ref 0–200)

## 2023-03-22 LAB — BASIC METABOLIC PANEL
Anion gap: 8 (ref 5–15)
BUN: 25 mg/dL — ABNORMAL HIGH (ref 8–23)
CO2: 23 mmol/L (ref 22–32)
Calcium: 8.4 mg/dL — ABNORMAL LOW (ref 8.9–10.3)
Chloride: 105 mmol/L (ref 98–111)
Creatinine, Ser: 1.28 mg/dL — ABNORMAL HIGH (ref 0.44–1.00)
GFR, Estimated: 42 mL/min — ABNORMAL LOW (ref >60)
Glucose, Bld: 212 mg/dL — ABNORMAL HIGH (ref 70–99)
Potassium: 4.1 mmol/L (ref 3.5–5.1)
Sodium: 136 mmol/L (ref 135–145)

## 2023-03-22 LAB — CBC
HCT: 28 % — ABNORMAL LOW (ref 36.0–46.0)
Hemoglobin: 9.2 g/dL — ABNORMAL LOW (ref 12.0–15.0)
MCH: 30.2 pg (ref 26.0–34.0)
MCHC: 32.9 g/dL (ref 30.0–36.0)
MCV: 91.8 fL (ref 80.0–100.0)
Platelets: 254 10*3/uL (ref 150–400)
RBC: 3.05 MIL/uL — ABNORMAL LOW (ref 3.87–5.11)
RDW: 16.7 % — ABNORMAL HIGH (ref 11.5–15.5)
WBC: 9.3 10*3/uL (ref 4.0–10.5)
nRBC: 0 % (ref 0.0–0.2)

## 2023-03-22 MED ORDER — SODIUM CHLORIDE 0.9 % IV SOLN: INTRAVENOUS | Status: AC

## 2023-03-22 NOTE — Evaluation (Signed)
Physical Therapy Evaluation Patient Details Name: Brooke Davis MRN: [id number] DOB: [birth year] Today's Date: 03/22/2023  History of Present Illness  Brooke Davis is a 82 y.o. female with medical history significant for DM, HTN, CAD s/p PCI and stent, dCHF A fib on Eliquis, and anxiety hospitalized in July 2024 with an acute embolic stroke while on Eliquis, with workup showing left carotid artery stenosis s/p L carotid stent placement on 02/25/23 and undergoing  total right hip replacement a couple weeks later on 03/09/23  who presents to the ED with a 3-day history of fever, nausea, dizziness and diarrhea.  Patient had been feeling progressively weak and on the night of arrival when she tried to get into bed she fell, breaking her bedside table. Patient found to have infection in hip and is s/p I &D and exchange of components with antibiotic bead placement. WBAT, posterior hip precautions.   Clinical Impression  Patient received in bed, she is hesitant about moving, but agreeable. She requires min A only with supine>< sit. Able to stand at edge of bed with min A and RW. Patient limited by fatigue this session. She has mild pain with mobility. Patient will continue to benefit from skilled PT to improve independence, safety and endurance.          If plan is discharge home, recommend the following: A lot of help with walking and/or transfers;A lot of help with bathing/dressing/bathroom;Assist for transportation;Help with stairs or ramp for entrance;Assistance with cooking/housework   Can travel by private vehicle   No    Equipment Recommendations None recommended by PT  Recommendations for Other Services       Functional Status Assessment Patient has had a recent decline in their functional status and demonstrates the ability to make significant improvements in function in a reasonable and predictable amount of time.     Precautions / Restrictions Precautions Precautions: Posterior  Hip;Fall Precaution Booklet Issued: No Precaution Comments: 2 drains from right hip Restrictions Weight Bearing Restrictions: Yes RLE Weight Bearing: Weight bearing as tolerated      Mobility  Bed Mobility Overal bed mobility: Needs Assistance Bed Mobility: Supine to Sit     Supine to sit: Min assist, HOB elevated, Used rails Sit to supine: Used rails, Min assist   General bed mobility comments: min A needed for bed mobility due to R hip pain    Transfers Overall transfer level: Needs assistance Equipment used: Rolling walker (2 wheels) Transfers: Sit to/from Stand Sit to Stand: Contact guard assist           General transfer comment: patient able to stand at edge of bed with contact guard and RW. She is limited by fatigue.    Ambulation/Gait               General Gait Details: unwilling to attempt this session due to fatigue  Stairs            Wheelchair Mobility     Tilt Bed    Modified Rankin (Stroke Patients Only)       Balance Overall balance assessment: Needs assistance Sitting-balance support: Feet supported Sitting balance-Leahy Scale: Good     Standing balance support: Bilateral upper extremity supported, During functional activity, Reliant on assistive device for balance Standing balance-Leahy Scale: Fair                               Pertinent Vitals/Pain Pain Assessment  Pain Assessment: Faces Faces Pain Scale: Hurts little more Pain Location: R hip Pain Descriptors / Indicators: Discomfort, Sore, Grimacing, Guarding Pain Intervention(s): Monitored during session, Repositioned, Premedicated before session    Home Living Family/patient expects to be discharged to:: Skilled nursing facility Living Arrangements: Other relatives (lives with grandson who works and she is then alone) Available Help at Discharge: Family;Available PRN/intermittently Type of Home: House Home Access: Ramped entrance       Home  Layout: One level Home Equipment: Toilet riser;BSC/3in1;Rollator (4 wheels);Wheelchair - Forensic psychologist (2 wheels);Grab bars - toilet;Grab bars - tub/shower;Shower seat;Other (comment)      Prior Function Prior Level of Function : Needs assist             Mobility Comments: MOD I with rollator ADLs Comments: assist for IADLs from family due to recent hip surgery; generally MOD I for ADLs, assist for LB dressing after hip surgery     Extremity/Trunk Assessment   Upper Extremity Assessment Upper Extremity Assessment: Defer to OT evaluation    Lower Extremity Assessment Lower Extremity Assessment: RLE deficits/detail RLE: Unable to fully assess due to pain RLE Coordination: decreased gross motor    Cervical / Trunk Assessment Cervical / Trunk Assessment: Normal  Communication   Communication Communication: Hearing impairment;Other (comment) (has hearing aides that are not working well) Cueing Techniques: Verbal cues  Cognition Arousal: Alert Behavior During Therapy: WFL for tasks assessed/performed Overall Cognitive Status: Within Functional Limits for tasks assessed                                          General Comments      Exercises     Assessment/Plan    PT Assessment Patient needs continued PT services  PT Problem List Decreased strength;Decreased range of motion;Decreased activity tolerance;Decreased balance;Decreased mobility;Obesity;Pain;Decreased knowledge of precautions       PT Treatment Interventions DME instruction;Gait training;Stair training;Functional mobility training;Therapeutic activities;Therapeutic exercise;Patient/family education;Balance training;Neuromuscular re-education    PT Goals (Current goals can be found in the Care Plan section)  Acute Rehab PT Goals Patient Stated Goal: to get stronger PT Goal Formulation: With patient/family Time For Goal Achievement: 04/05/23 Potential to Achieve Goals: Good     Frequency Min 1X/week     Co-evaluation               AM-PAC PT "6 Clicks" Mobility  Outcome Measure Help needed turning from your back to your side while in a flat bed without using bedrails?: A Little Help needed moving from lying on your back to sitting on the side of a flat bed without using bedrails?: A Little Help needed moving to and from a bed to a chair (including a wheelchair)?: A Lot Help needed standing up from a chair using your arms (e.g., wheelchair or bedside chair)?: A Little Help needed to walk in hospital room?: A Lot Help needed climbing 3-5 steps with a railing? : A Lot 6 Click Score: 15    End of Session   Activity Tolerance: Patient limited by fatigue Patient left: in bed;with call bell/phone within reach;with bed alarm set;with family/visitor present Nurse Communication: Mobility status PT Visit Diagnosis: Other abnormalities of gait and mobility (R26.89);Unsteadiness on feet (R26.81);Muscle weakness (generalized) (M62.81);Difficulty in walking, not elsewhere classified (R26.2);Pain Pain - Right/Left: Right Pain - part of body: Hip    Time: 4098-1191 PT Time Calculation (min) (ACUTE ONLY):  26 min   Charges:   PT Evaluation $PT Eval Moderate Complexity: 1 Mod PT Treatments $Therapeutic Activity: 8-22 mins PT General Charges $$ ACUTE PT VISIT: 1 Visit         Anisah Kuck, PT, GCS 03/22/23,10:22 AM

## 2023-03-22 NOTE — Progress Notes (Signed)
The above named patient is recommended to go to Short Term Rehab for strengthening and gait training for balance.  It is expected that the Short Term Rehab stay will be less than 30 days.  The patient is expected to return home after Rehab.  

## 2023-03-22 NOTE — Progress Notes (Signed)
Chaplain visited per consult and request. Pt Brooke Davis wants to ensure her wishes are followed. We have been unable to get paperwork notarized today, but can get it done in the morning. Should [place] need any decisions made please contact her two daughters, Brooke Davis or Brooke Davis per pt wishes.  Will continue to search for witnesses and notary.

## 2023-03-22 NOTE — Plan of Care (Signed)
  Problem: Fluid Volume: Goal: Hemodynamic stability will improve Outcome: Progressing   Problem: Clinical Measurements: Goal: Will remain free from infection 03/22/2023 0012 by Laverta Baltimore, RN Outcome: Progressing 03/21/2023 2317 by Laverta Baltimore, RN Outcome: Progressing   Problem: Activity: Goal: Risk for activity intolerance will decrease 03/22/2023 0012 by Laverta Baltimore, RN Outcome: Progressing 03/21/2023 2317 by Laverta Baltimore, RN Outcome: Progressing   Problem: Elimination: Goal: Will not experience complications related to bowel motility Outcome: Progressing   Problem: Pain Managment: Goal: General experience of comfort will improve 03/22/2023 0012 by Laverta Baltimore, RN Outcome: Progressing 03/21/2023 2317 by Laverta Baltimore, RN Outcome: Progressing   Problem: Safety: Goal: Ability to remain free from injury will improve 03/22/2023 0012 by Laverta Baltimore, RN Outcome: Progressing 03/21/2023 2317 by Laverta Baltimore, RN Outcome: Progressing   Problem: Skin Integrity: Goal: Risk for impaired skin integrity will decrease 03/22/2023 0012 by Laverta Baltimore, RN Outcome: Progressing 03/21/2023 2317 by Laverta Baltimore, RN Outcome: Progressing

## 2023-03-22 NOTE — Consult Note (Cosign Needed Addendum)
Consultation Note Date: 03/22/2023   Patient Name: Brooke Davis  DOB: 1940/08/23  MRN: [id number]  Age / Sex: 82 y.o., female  PCP: [name], MD Referring Physician: Darlin Drop, DO  Reason for Consultation: Establishing goals of care  HPI/Patient Profile: 82 y.o. female with medical history significant for DM2, HTN, CAD s/p PCI and stent, dCHF A fib on Eliquis, hospitalized in July 2024 for elective hip replacement, again hospitalized in July 2024 with an acute embolic stroke while on Eliquis, with workup showing left carotid artery stenosis, s/p carotid stent placement on 02/25/23,  who presents to the ED with a 3-day history of fever, nausea, dizziness and diarrhea.  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  Status post right hip I&D with exchange of head and poly components and placement of antibiotic beads on 03/21/2023 by orthopedic surgery.   Clinical Assessment and Goals of Care: In to see patient.  She is currently resting in bed with daughters [name] and Raynelle Fanning at bedside.  Patient and family state that she was widowed on November 30.  They states she has been in a lot of pain which was relieved by her elective hip surgery.  She discusses going to rehab and then having a stroke, and now an infection.  She states prior to this admission she was able to walk with a walker.   We discussed her diagnoses, prognosis, GOC, EOL wishes disposition and options.  Created space and opportunity for patient  to explore thoughts and feelings regarding current medical information.   A detailed discussion was had today regarding advanced directives.  Concepts specific to code status, artifical feeding and hydration, IV antibiotics and rehospitalization were discussed.  The difference between an aggressive medical intervention path and a comfort care path was discussed.  Values and goals of care important  to patient and family were attempted to be elicited.  Discussed limitations of medical interventions to prolong quality of life in some situations and discussed the concept of human mortality.  She discusses acceptable quality of life, and states acceptable quality of life to her would be her having mental faculties to be able to converse, and reasonable physical abilities to be able to do ADLs for herself.  Patient states she would never want CPR.  She states she would want ventilator support for up to 1 week and then if she is unable to come off the ventilator, would want to be transition to comfort care.  She is amenable to antibiotic therapy, and IV fluids.  She states she would never want a feeding tube.  She and daughters agree, as long as she is ambulatory, she would like to discharge home, and not to rehab.  She states she has 4 daughters.  She states she would want her two daughters [name] and Raynelle Fanning to be her healthcare power of attorney.  Spiritual care to assist with these forms.  I completed a MOST form today which was signed by patient with patient's daughters Raynelle Fanning and [name] at bedside, and  the signed original was placed in the chart. The form was scanned and sent to medical records for it to be uploaded under ACP tab in Epic. A photocopy was also placed in the chart to be scanned into EMR. The patient outlined their wishes for the following treatment decisions:  Cardiopulmonary Resuscitation: Do Not Attempt Resuscitation (DNR/No CPR)  Medical Interventions: Full Scope of Treatment: Use intubation, advanced airway interventions, mechanical ventilation, cardioversion as indicated, medical treatment, IV fluids, etc, also provide comfort measures. Transfer to the hospital if indicated.     Intubation up to 1 week.  Antibiotics: Antibiotics if indicated  IV Fluids: IV fluids if indicated  Feeding Tube: No feeding tube       SUMMARY OF RECOMMENDATIONS   DNR Intubation for up to 1  week. Continue current care.    Prognosis:  Unable to determine       Primary Diagnoses: Present on Admission:  Sepsis (HCC)  Chronic diastolic CHF (congestive heart failure) (HCC)  Anxiety and depression  Primary hypertension  Atrial fibrillation, chronic (HCC)   I have reviewed the medical record, interviewed the patient and family, and examined the patient. The following aspects are pertinent.  Past Medical History:  Diagnosis Date   Anxiety    a.) on BZO (alprazolam) PRN   Arthritis    fingers, right hip   Atrial fibrillation (HCC) 11/10/2013   a.) CHA2DS2VASc = 5 (age x2, sex, HTN, T2DM);  b.) rate/rhythm maintained on oral metoprolol tartrate; chronically anticoagulated with apixaban   Bronchitis    Cerebrovascular small vessel disease (chronic)    Coronary artery disease involving native coronary artery of native heart 01/11/2021   a.) LHC 01/11/2021: 40% oLM, 50% mLCx, 30% oLCx, 30% pLAD, 30% mLAD, 99% pRCA (2.75 x 30 mm Resolute Onyx DES), 75% p-mRCA, 60% dRCA   DDD (degenerative disc disease), lumbar 12/01/2013   Diastolic dysfunction 01/12/2021   a.) TTE 01/12/2021 (s/p inf STEMI): EF 55-60%, mild-mod LAE, mild RAE, mod RVE, mild AR/MR, mod TR, G1DD   Diet-controlled type 2 diabetes mellitus (HCC)    Full dentures    Gout    History of bilateral cataract extraction 2021   Hyperlipidemia    Hypertension, essential    Long term current use of anticoagulant    a.) apixaban   PONV (postoperative nausea and vomiting)    Sinoatrial node dysfunction (HCC) 11/10/2013   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) 01/11/2021   a.) LHC/PCI 01/11/2021 --> culprit lesion 99% pRCA --> 2.75 x 30 mm Resolute Onyx DES x 1   Varicose veins of legs    Wears hearing aid in both ears    Social History   Socioeconomic History   Marital status: Widowed    Spouse name: Not on file   Number of children: 4   Years of education: Not on file   Highest  education level: Not on file  Occupational History   Not on file  Tobacco Use   Smoking status: Former    Current packs/day: 0.00    Types: Cigarettes    Quit date: 1980    Years since quitting: 44.7   Smokeless tobacco: Never  Vaping Use   Vaping status: Never Used  Substance and Sexual Activity   Alcohol use: Not Currently   Drug use: Never   Sexual activity: Not on file  Other Topics Concern   Not on file  Social History Narrative   Not on file   Social  Determinants of Health   Financial Resource Strain: Patient Declined (11/05/2022)   Received [facility]   Overall Financial Resource Strain (CARDIA)    Difficulty of Paying Living Expenses: Patient declined  Food Insecurity: No Food Insecurity (03/19/2023)   Hunger Vital Sign    Worried About Running Out of Food in the Last Year: Never true    Ran Out of Food in the Last Year: Never true  Transportation Needs: No Transportation Needs (03/19/2023)   PRAPARE - Administrator, Civil Service (Medical): No    Lack of Transportation (Non-Medical): No  Physical Activity: Not on file  Stress: Not on file  Social Connections: Not on file   Family History  Problem Relation Age of Onset   Heart attack Mother 85       required open heart surgery. Onset age is unknown.   Stroke Father    Diabetes Father    Alcoholism Brother    Breast cancer Neg Hx    Scheduled Meds:  allopurinol  300 mg Oral Daily   apixaban  5 mg Oral BID   atorvastatin  80 mg Oral Daily   citalopram  20 mg Oral Daily   clopidogrel  75 mg Oral Daily   metoprolol tartrate  12.5 mg Oral BID   Continuous Infusions:  sodium chloride 30 mL/hr at 03/22/23 1108   ceFEPime (MAXIPIME) IV 2 g (03/22/23 1109)   [START ON 03/23/2023] vancomycin     PRN Meds:.acetaminophen, ALPRAZolam, melatonin, ondansetron **OR** ondansetron (ZOFRAN) IV, oxyCODONE Medications Prior to Admission:  Prior to  Admission medications   Medication Sig Start Date End Date Taking? Authorizing Provider  allopurinol (ZYLOPRIM) 300 MG tablet Take 1 tablet (300 mg total) by mouth daily. 02/10/23  Yes Angiulli, Mcarthur Rossetti, PA-C  ALPRAZolam Prudy Feeler) 0.25 MG tablet Take 0.25 mg by mouth 2 (two) times daily as needed for anxiety.   Yes [provider]  apixaban (ELIQUIS) 5 MG TABS tablet Take 1 tablet (5 mg total) by mouth 2 (two) times daily. 02/10/23  Yes Angiulli, Mcarthur Rossetti, PA-C  aspirin EC 81 MG tablet Take 1 tablet (81 mg total) by mouth daily. Swallow whole. 03/02/23  Yes Pace, Brien R, NP  atorvastatin (LIPITOR) 80 MG tablet Take 1 tablet (80 mg total) by mouth daily. 03/01/23  Yes Pace, Brien R, NP  citalopram (CELEXA) 20 MG tablet Take 1 tablet (20 mg total) by mouth daily. 02/10/23  Yes Angiulli, Mcarthur Rossetti, PA-C  clopidogrel (PLAVIX) 75 MG tablet Take 1 tablet (75 mg total) by mouth daily. 02/16/23  Yes Dew, Marlow Baars, MD  metoprolol tartrate (LOPRESSOR) 25 MG tablet Take 1 tablet (25 mg total) by mouth 2 (two) times daily. 02/10/23  Yes Angiulli, Mcarthur Rossetti, PA-C  potassium chloride (KLOR-CON) 10 MEQ tablet Take 1 tablet (10 mEq total) by mouth daily. 02/10/23  Yes Angiulli, Mcarthur Rossetti, PA-C  sacubitril-valsartan (ENTRESTO) 24-26 MG Take 1 tablet by mouth 2 (two) times daily. 02/10/23  Yes Angiulli, Mcarthur Rossetti, PA-C  torsemide (DEMADEX) 20 MG tablet Take 1 tablet (20 mg total) by mouth daily. 02/10/23  Yes Angiulli, Mcarthur Rossetti, PA-C  traMADol (ULTRAM) 50 MG tablet Take 100 mg by mouth every 8 (eight) hours as needed for moderate pain.   Yes [provider]  acetaminophen (TYLENOL) 325 MG tablet Take 2 tablets (650 mg total) by mouth every 6 (six) hours as needed for mild pain (or Fever >/= 101). 02/10/23  Angiulli, Mcarthur Rossetti, PA-C  clonazePAM (KLONOPIN) 0.5 MG tablet Take 1 tablet (0.5 mg total) by mouth 3 (three) times daily as needed (anxiety). Patient not taking: Reported on 02/25/2023 02/10/23   Angiulli, Mcarthur Rossetti, PA-C  melatonin 5 MG TABS Take 1 tablet (5 mg total) by mouth at bedtime. Patient not taking: Reported on 02/25/2023 02/10/23   Angiulli, Mcarthur Rossetti, PA-C  oxyCODONE (OXY IR/ROXICODONE) 5 MG immediate release tablet Take 1 tablet (5 mg total) by mouth every 4 (four) hours as needed for moderate pain (pain score 4-6). Patient not taking: Reported on 02/26/2023 02/10/23   Charlton Amor, PA-C   Allergies  Allergen Reactions   Celebrex [Celecoxib] Diarrhea   Penicillins Itching    IgE = 154 (WNL) on 01/05/2023   Relafen [Nabumetone]     Palpitation    Lovastatin Diarrhea   Review of Systems  All other systems reviewed and are negative.   Physical Exam HENT:     Head:     Comments: Hard of hearing Pulmonary:     Effort: Pulmonary effort is normal.  Skin:    General: Skin is warm and dry.  Neurological:     Mental Status: She is alert.     Vital Signs: BP (!) 104/47 (BP Location: Left Arm)   Pulse 90   Temp 98 F (36.7 C)   Resp 17   Ht 5\' 2"  (1.575 m)   Wt 85.6 kg   SpO2 100%   BMI 34.52 kg/m  Pain Scale: 0-10   Pain Score: 0-No pain   SpO2: SpO2: 100 % O2 Device:SpO2: 100 % O2 Flow Rate: .   IO: Intake/output summary:  Intake/Output Summary (Last 24 hours) at 03/22/2023 1522 Last data filed at 03/22/2023 0981 Gross per 24 hour  Intake 330 ml  Output 645 ml  Net -315 ml    LBM: Last BM Date : 03/19/23 Baseline Weight: Weight: 81.2 kg Most recent weight: Weight: 85.6 kg      Signed by: Morton Stall, NP   Please contact Palliative Medicine Team phone at [phone] for questions and concerns.  For individual provider: See Loretha Stapler

## 2023-03-22 NOTE — Plan of Care (Signed)
  Problem: Pain Managment: Goal: General experience of comfort will improve Outcome: Progressing   Problem: Safety: Goal: Ability to remain free from injury will improve Outcome: Progressing   Problem: Skin Integrity: Goal: Risk for impaired skin integrity will decrease Outcome: Progressing   

## 2023-03-22 NOTE — Care Management Important Message (Signed)
Important Message  Patient Details  Name: Brooke Davis MRN: [id number] Date of Birth: May 12, 1941   Medicare Important Message Given:  Yes     Johnell Comings 03/22/2023, 11:00 AM

## 2023-03-22 NOTE — Progress Notes (Addendum)
Orthopedic progress note  Chief complaint infected right total hip arthroplasty, thigh abscess  Subjective, no acute events overnight  Objective, chart reviewed White cell count 9.3 Hemoglobin 9.2 Superficial drain, 15 [place], 10cc 9/1 Deep drain, 10 [place], 40 cc 9/1  Bandaging is clean and dry. No sign of complication.  She is neurovascularly intact  Imaging: Postoperative x-rays look good  Assessment and plan  82 year old female postoperative day #1 status post washout of right thigh infection and right total hip arthroplasty component changing of polyethylene and head.  Overall doing well.  Hemodynamically stable.  Minimal drain output at this time.  Postop x-rays look good.  Weightbearing as tolerated, posterior hip precautions, right lower extremity. Appreciate input and agree with plan for the Plavix/Eliquis by the vascular/hospitalist team.  Will continue to follow.    Aalaya Yadao, ortho

## 2023-03-22 NOTE — [place] FL2 (Signed)
MEDICAID FL2 LEVEL OF CARE FORM     IDENTIFICATION  Patient Name: Brooke Davis Birthdate: Mar 05, 1941 Sex: female Admission Date (Current Location): 03/18/2023  [facility] and [place] Number:  Chiropodist and Address:  [facility], [address] [place], [place] [address]      Provider Number: [id number]  Attending Physician Name and Address:  Darlin Drop, DO  Relative Name and Phone Number:  [name] Daughter [phone]    Current Level of Care: Hospital Recommended Level of Care: Skilled Nursing Facility Prior Approval Number:    Date Approved/Denied:   PASRR Number: Pending  Discharge Plan: SNF    Current Diagnoses: Patient Active Problem List   Diagnosis Date Noted   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 02/2023 03/19/2023   CAD S/P percutaneous coronary angioplasty 03/19/2023   Generalized weakness 03/19/2023   Chronic anticoagulation 03/19/2023   Cellulitis of right hip 03/19/2023   Sepsis (HCC) 03/18/2023   Carotid stenosis, symptomatic, with infarction (HCC) 02/25/2023   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   Atrial fibrillation, chronic (HCC) 01/28/2023   Dyslipidemia 01/28/2023   Chronic diastolic CHF (congestive heart failure) (HCC) 01/28/2023   Gout 01/28/2023   Anxiety and depression 01/28/2023   Right arm weakness 01/28/2023   Acute CVA (cerebrovascular accident) (HCC) 01/28/2023   Bilateral carotid artery stenosis 01/28/2023   Hx of total hip arthroplasty, 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 01/18/2023   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   Slurred speech 09/23/2021   STEMI involving oth coronary artery of inferior wall (HCC) 01/11/2021   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) 01/11/2021   Controlled type 2 diabetes mellitus with hyperglycemia, without long-term current use of insulin  (HCC) 01/11/2021   Pure hypercholesterolemia 01/11/2021   Hypercalcemia 01/11/2021   DDD (degenerative disc disease), lumbar 12/01/2013   Lumbar radiculitis 12/01/2013   Atrial fibrillation, transient (HCC) 11/10/2013   Sinoatrial node dysfunction (HCC) 11/10/2013   Primary hypertension 11/10/2013   Poorly-controlled hypertension 11/10/2013    Orientation RESPIRATION BLADDER Height & Weight     Self, Time, Situation, Place  Normal Continent, External catheter Weight: 85.6 kg Height:  5\' 2"  (157.5 cm)  BEHAVIORAL SYMPTOMS/MOOD NEUROLOGICAL BOWEL NUTRITION STATUS      Continent Diet (See DC summary)  AMBULATORY STATUS COMMUNICATION OF NEEDS Skin   Extensive Assist Verbally Normal, Surgical wounds                       Personal Care Assistance Level of Assistance  Bathing, Feeding, Dressing Bathing Assistance: Maximum assistance Feeding assistance: Independent Dressing Assistance: Limited assistance     Functional Limitations Info  Sight, Hearing, Speech Sight Info: Adequate Hearing Info: Impaired Speech Info: Adequate    SPECIAL CARE FACTORS FREQUENCY  PT (By licensed PT), OT (By licensed OT)     PT Frequency: 5 times per week OT Frequency: 5 times per week            Contractures Contractures Info: Not present    Additional Factors Info  Code Status, Allergies Code Status Info: Full Allergies Info: Celebrex (Celecoxib), Penicillins, Relafen (Nabumetone), Lovastatin           Current Medications (03/22/2023):  This is the current hospital active medication list Current Facility-Administered Medications  Medication Dose Route Frequency Provider Last Rate Last Admin   0.9 %  sodium chloride infusion  Intravenous Continuous Darlin Drop, DO 30 mL/hr at 03/22/23 1108 New Bag at 03/22/23 1108   acetaminophen (TYLENOL) tablet 650 mg  650 mg Oral Q6H PRN Campbell Stall, MD   650 mg at 03/19/23 1611   allopurinol (ZYLOPRIM) tablet 300 mg  300 mg Oral  Daily Campbell Stall, MD   300 mg at 03/22/23 1610   ALPRAZolam Prudy Feeler) tablet 0.25 mg  0.25 mg Oral BID PRN Campbell Stall, MD   0.25 mg at 03/22/23 0524   apixaban (ELIQUIS) tablet 5 mg  5 mg Oral BID Andris Baumann, MD   5 mg at 03/22/23 9604   atorvastatin (LIPITOR) tablet 80 mg  80 mg Oral Daily Campbell Stall, MD   80 mg at 03/22/23 5409   ceFEPIme (MAXIPIME) 2 g in sodium chloride 0.9 % 100 mL IVPB  2 g Intravenous Q12H Campbell Stall, MD 200 mL/hr at 03/22/23 1109 2 g at 03/22/23 1109   citalopram (CELEXA) tablet 20 mg  20 mg Oral Daily Campbell Stall, MD   20 mg at 03/22/23 8119   clopidogrel (PLAVIX) tablet 75 mg  75 mg Oral Daily Andris Baumann, MD   75 mg at 03/22/23 1478   melatonin tablet 5 mg  5 mg Oral QHS PRN Campbell Stall, MD       metoprolol tartrate (LOPRESSOR) tablet 12.5 mg  12.5 mg Oral BID Dow Adolph N, DO   12.5 mg at 03/22/23 0822   ondansetron (ZOFRAN) tablet 4 mg  4 mg Oral Q6H PRN Campbell Stall, MD   4 mg at 03/20/23 2956   Or   ondansetron (ZOFRAN) injection 4 mg  4 mg Intravenous Q6H PRN Zafonte, Adelfa Koh, MD       oxyCODONE (Oxy IR/ROXICODONE) immediate release tablet 5 mg  5 mg Oral Q4H PRN Campbell Stall, MD   5 mg at 03/22/23 0823   [START ON 03/23/2023] vancomycin (VANCOREADY) IVPB 1250 mg/250 mL  1,250 mg Intravenous Q48H Zafonte, Adelfa Koh, MD         Discharge Medications: Please see discharge summary for a list of discharge medications.  Relevant Imaging Results:  Relevant Lab Results:   Additional Information SS [id number]  Marlowe Sax, RN

## 2023-03-22 NOTE — TOC Progression Note (Addendum)
Transition of Care [facility]) - Progression Note    Patient Details  Name: Brooke Davis MRN: [id number] Date of Birth: [birth year]  Transition [facility] CM/SW Contact  Marlowe Sax, RN Phone Number: 03/22/2023, 11:57 AM  Clinical Narrative:     Met with the patient and her daughters in the room, they are agreeable to a bedsearch for STR, PASSR Pending, bedsearch sent       Expected Discharge Plan and Services                                               Social Determinants of Health (SDOH) Interventions SDOH Screenings   Food Insecurity: No Food Insecurity (03/19/2023)  Housing: Low Risk  (03/19/2023)  Transportation Needs: No Transportation Needs (03/19/2023)  Utilities: Not At Risk (03/19/2023)  Financial Resource Strain: Patient Declined (11/05/2022)   [facility]  Tobacco Use: Medium Risk (03/18/2023)    Readmission Risk Interventions    03/01/2023   10:20 AM  Readmission Risk Prevention Plan  Transportation Screening Complete  PCP or Specialist Appt within 3-5 Days Complete  HRI or Home Care Consult Complete  Social Work Consult for Recovery Care Planning/Counseling Complete  Palliative Care Screening Not Applicable  Medication Review Oceanographer) Complete

## 2023-03-22 NOTE — Progress Notes (Signed)
1      PROGRESS NOTE    Brooke Davis  ZOX:[id number] DOB: 27-May-1941 DOA: 03/18/2023 PCP: [name], MD    Brief Narrative:   82 y.o. female with medical history significant for DM2, HTN, CAD s/p PCI and stent, dCHF A fib on Eliquis, and anxiety hospitalized in July 2024 with an acute embolic stroke while on Eliquis, with workup showing left carotid artery stenosis s/p L carotid stent placement on 02/25/23 and undergoing  total right hip replacement a couple weeks later on 03/09/23  who presents to the ED with a 3-day history of fever, nausea, dizziness and diarrhea.  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  Status post right hip irrigation and debridement on 03/21/2023 by orthopedic surgery Dr. Clemencia Course.  03/22/2023: Seen and examined at bedside.  She has no new complaints.  2 daughters were present at bedside.  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 Clemencia Course Sepsis criteria included fever and tachycardia.   Sepsis physiology is improving.  WBC normal, lactic acid normal Suspecting postop infection related to right hip replacement  - Continue to follow-up peripheral blood cultures x 2. - continue vancomycin and cefepime, follow ID and sensitivities from cultures and narrow down antibiotics as able. Continue to maintain MAP greater than 65. PRN analgesics   Hx of total hip arthroplasty, 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 Continue as needed analgesics    Generalized weakness Secondary to acute illness PT evaluation Fall precautions   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 02/2023 Prior to admission on Eliquis and Plavix.  Antiplatelet NWC on hold.   Restart DOAC and antiplatelet within 48 hours post surgical intervention if able, as recommended by vascular surgery.  Anticipate restart date no longer than 03/23/2023.   Chronic diastolic CHF (congestive heart failure) (HCC) Clinically euvolemic Hold Entresto, metoprolol and torsemide due to soft BPs Monitor for fluid overload in view of IV fluid resuscitation in the management of suspected sepsis Continue strict I's and O's and daily weight   Atrial fibrillation, chronic (HCC) Chronic anticoagulation Currently rate controlled Off Eliquis for 48 hours On metoprolol on hold to avoid hypotension due to soft BPs   CAD S/P percutaneous coronary angioplasty Continue atorvastatin and metoprolol   Anxiety and depression Continue citalopram and alprazolam   Primary hypertension Hold off home oral antihypertensives due to soft BPs Continue to closely monitor vital signs.  Goals of care: Currently full code.  Time: 25 minutes.  DVT prophylaxis:  apixaban (ELIQUIS) tablet 5 mg     Code Status: (Full code Family Communication: (Updated daughter and remaining family numbers at the bedside Disposition Plan: Home with home health versus SNF depending on clinical progress   Consultants:  Orthopedics Curb sided with vascular surgery, Dr. Wyn Quaker.    Antimicrobials:  IV cefepime and vancomycin   Subjective: Patient is very hard of hearing, reports minimal pain  Objective: Vitals:   03/22/23 0321 03/22/23 0913 03/22/23 1242 03/22/23 1754  BP: (!) 142/69 129/74 (!) 104/47 (!) 119/50  Pulse: 82 75 90 80  Resp: 16 17 17 17   Temp: (!) 97.5 F (36.4 C) 97.9 F (36.6 C) 98 F (36.7 C) 98.2 F (36.8 C)  TempSrc:      SpO2: 99% 99% 100% 100%  Weight:       Height:        Intake/Output Summary (Last 24 hours) at 03/22/2023 1800 Last data filed at 03/22/2023 0914 Gross per 24 hour  Intake 330 ml  Output 645 ml  Net -315 ml   Filed Weights   03/18/23 2122 03/19/23 0753 03/21/23 0500  Weight: 81.2 kg 83.8 kg 85.6 kg    Examination:  General exam: Appears calm and comfortable.  Interactive. In no acute distress. Respiratory system: Clear to auscultation. Respiratory effort normal. Cardiovascular system: S1 & S2 heard,  Regular rate and rhythm no rubs or gallops.  No JVD or thyromegaly. Gastrointestinal system: Abdomen is nondistended, soft and nontender. No organomegaly or masses felt. Normal bowel sounds heard. Central nervous system: Alert and oriented.  No focal neurological deficits. Extremities: Well-healed surgical wound on the right hip, minimal erythema and warmth/induration around distal aspect of right hip Skin: As above listed in the extremities Psychiatry: Judgement and insight appear normal. Mood & affect appropriate.     Data Reviewed: I have personally reviewed following labs and imaging studies  CBC: Recent Labs  Lab 03/18/23 2134 03/20/23 0605 03/21/23 0433 03/22/23 0519  WBC 9.8 7.8 8.4 9.3  NEUTROABS 8.8*  --   --   --   HGB 9.1* 8.2* 8.6* 9.2*  HCT 27.9* 25.6* 26.6* 28.0*  MCV 92.1 93.1 92.0 91.8  PLT 290 218 250 254   Basic Metabolic Panel: Recent Labs  Lab 03/18/23 2216 03/20/23 0605 03/21/23 0433 03/22/23 0519  NA 134* 136 134* 136  K 3.3* 3.6 3.5 4.1  CL 96* 103 100 105  CO2 26 26 26 23   GLUCOSE 146* 155* 111* 212*  BUN 18 17 17  25*  CREATININE 1.27* 1.17* 1.17* 1.28*  CALCIUM 8.7* 8.3* 8.3* 8.4*  MG  --  1.4*  --   --    GFR: Estimated Creatinine Clearance: 35.6 mL/min (A) (by C-G formula based on SCr of 1.28 mg/dL (H)). Liver Function Tests: Recent Labs  Lab 03/18/23 2216  AST 19  ALT 12  ALKPHOS 85  BILITOT 0.9  PROT 6.5  ALBUMIN 2.9*   No results for input(s): "LIPASE",  "AMYLASE" in the last 168 hours. No results for input(s): "AMMONIA" in the last 168 hours. Coagulation Profile: Recent Labs  Lab 03/18/23 2216  INR 2.2*   Sepsis Labs: Recent Labs  Lab 03/18/23 2134 03/19/23 0142  PROCALCITON  --  0.46  LATICACIDVEN 1.7 1.2    Recent Results (from the past 240 hour(s))  Blood Culture (routine x 2)     Status: None (Preliminary result)   Collection Time: 03/18/23  9:34 PM   Specimen: BLOOD RIGHT HAND  Result Value Ref Range Status   Specimen Description BLOOD RIGHT HAND  Final   Special Requests   Final    BOTTLES DRAWN AEROBIC ONLY Blood Culture adequate volume   Culture   Final    NO GROWTH 4 DAYS Performed at [facility], [address] [place], [place] [address]    Report Status PENDING  Incomplete  Blood Culture (routine x 2)     Status: None (Preliminary result)   Collection Time: 03/18/23  9:36 PM   Specimen: BLOOD LEFT HAND  Result Value Ref Range Status   Specimen Description BLOOD LEFT HAND  Final   Special Requests   Final    BOTTLES DRAWN AEROBIC AND ANAEROBIC Blood Culture adequate volume   Culture   Final    NO GROWTH 4 DAYS Performed at [facility], [address] [place], [place] [address]    Report Status PENDING  Incomplete  Resp panel by RT-PCR (RSV, Flu A&B, Covid) Anterior Nasal Swab     Status: None   Collection Time: 03/18/23 10:28 PM   Specimen: Anterior Nasal Swab  Result Value Ref Range Status   SARS Coronavirus 2 by RT PCR NEGATIVE NEGATIVE Final    Comment: (NOTE) SARS-CoV-2 target nucleic acids are NOT DETECTED.  The SARS-CoV-2 RNA is generally detectable in upper respiratory specimens during the acute phase of infection. The lowest concentration of SARS-CoV-2 viral copies this assay can detect is 138 copies/mL. A negative result does not preclude SARS-Cov-2 infection and should not be used as the sole basis for treatment or other patient management decisions. A negative result  may occur with  improper specimen collection/handling, submission of specimen other than nasopharyngeal swab, presence of viral mutation(s) within the areas targeted by this assay, and inadequate number of viral copies(<138 copies/mL). A negative result must be combined with clinical observations, patient history, and epidemiological information. The expected result is Negative.  Fact Sheet for Patients:  BloggerCourse.com  Fact Sheet for Healthcare Providers:  SeriousBroker.it  This test is no t yet approved or cleared by the [place] FDA and  has been authorized for detection and/or diagnosis of SARS-CoV-2 by FDA under an Emergency Use Authorization (EUA). This EUA will remain  in effect (meaning this test can be used) for the duration of the COVID-19 declaration under Section 564(b)(1) of the Act, 21 U.S.C.section 360bbb-3(b)(1), unless the authorization is terminated  or revoked sooner.       Influenza A by PCR NEGATIVE NEGATIVE Final   Influenza B by PCR NEGATIVE NEGATIVE Final    Comment: (NOTE) The Xpert Xpress SARS-CoV-2/FLU/RSV plus assay is intended as an aid in the diagnosis of influenza from Nasopharyngeal swab specimens and should not be used as a sole basis for treatment. Nasal washings and aspirates are unacceptable for Xpert Xpress SARS-CoV-2/FLU/RSV testing.  Fact Sheet for Patients: BloggerCourse.com  Fact Sheet for Healthcare Providers: SeriousBroker.it  This test is not yet approved or cleared by the [place] FDA and has been authorized for detection and/or diagnosis of SARS-CoV-2 by FDA under an Emergency Use Authorization (EUA). This EUA will remain in effect (meaning this test can be used) for the duration of the COVID-19 declaration under Section 564(b)(1) of the Act, 21 U.S.C. section 360bbb-3(b)(1), unless the authorization is terminated  or revoked.     Resp Syncytial Virus by PCR NEGATIVE NEGATIVE Final    Comment: (NOTE) Fact Sheet for Patients: BloggerCourse.com  Fact Sheet for Healthcare Providers: SeriousBroker.it  This test is not yet approved or cleared by the [place] FDA and has been authorized for detection and/or diagnosis of SARS-CoV-2 by FDA under an Emergency Use Authorization (EUA). This EUA will remain in effect (meaning this test can be used) for the duration of the COVID-19 declaration under Section 564(b)(1) of the Act, 21 U.S.C. section 360bbb-3(b)(1), unless the authorization is terminated or revoked.  Performed at [facility], [address] [place], [place] [address]   MRSA Next Gen by PCR,  Nasal     Status: None   Collection Time: 03/19/23 11:02 AM   Specimen: Nasal Mucosa; Nasal Swab  Result Value Ref Range Status   MRSA by PCR Next Gen NOT DETECTED NOT DETECTED Final    Comment: (NOTE) The GeneXpert MRSA Assay (FDA approved for NASAL specimens only), is one component of a comprehensive MRSA colonization surveillance program. It is not intended to diagnose MRSA infection nor to guide or monitor treatment for MRSA infections. Test performance is not FDA approved in patients less than 20 years old. Performed at [facility], [address], [place], [place] [address]   Aerobic Culture w Gram Stain (superficial specimen)     Status: None (Preliminary result)   Collection Time: 03/21/23 12:00 PM   Specimen: Wound  Result Value Ref Range Status   Specimen Description   Final    WOUND Performed at [facility], [address] [place], [place] [address]    Special Requests   Final    NONE Performed at [facility], [address] [place], [place] [address]    Gram Stain   Final    MODERATE WBC PRESENT,BOTH PMN AND MONONUCLEAR NO ORGANISMS SEEN Performed at [facility],  [address] [place], [place] [address]    Culture RARE SERRATIA MARCESCENS  Final   Report Status PENDING  Incomplete  Aerobic/Anaerobic Culture w Gram Stain (surgical/deep wound)     Status: None (Preliminary result)   Collection Time: 03/21/23 12:01 PM   Specimen: Wound; Body Fluid  Result Value Ref Range Status   Specimen Description   Final    WOUND Performed at [facility], [address] [place], [place] [address]    Special Requests   Final    NONE Performed at [facility], [address] [place], [place] [address]    Gram Stain   Final    FEW WBC PRESENT,BOTH PMN AND MONONUCLEAR NO ORGANISMS SEEN    Culture   Final    NO GROWTH < 24 HOURS Performed at [facility], [address] [place], [place] [address]    Report Status PENDING  Incomplete  Aerobic Culture w Gram Stain (superficial specimen)     Status: None (Preliminary result)   Collection Time: 03/21/23 12:01 PM   Specimen: Wound  Result Value Ref Range Status   Specimen Description   Final    WOUND Performed at [facility], [address], [place], [place] [address]    Special Requests   Final    NONE Performed at [facility], [address] [place], [place] [address]    Gram Stain   Final    FEW WBC PRESENT,BOTH PMN AND MONONUCLEAR NO ORGANISMS SEEN    Culture   Final    RARE SERRATIA MARCESCENS SUSCEPTIBILITIES TO FOLLOW Performed at [facility], [address] [place], [place] [address]    Report Status PENDING  Incomplete         Radiology Studies: DG Pelvis 1-2 Views  Result Date: 03/21/2023 CLINICAL DATA:  Postoperative state. EXAM: PELVIS - 1-2 VIEW COMPARISON:  MRI right femur 03/20/2023, pelvis and right femur radiographs 03/20/2023 FINDINGS: Status post right hip arthroplasty. There are new surgical skin staples overlying the lateral right hip/thigh, lateral to the presumed subcutaneous fat abscess described on yesterday's MRI.  There is a postsurgical drain and a wound VAC overlying the lateral right hip/proximal thigh. There are presumed antibiotic  beads overlying the right femoroacetabular joint. No perihardware lucency is seen to indicate hardware failure or loosening. Mild pubic symphysis joint space narrowing, subchondral sclerosis, and peripheral osteophytosis. Mild bilateral sacroiliac subchondral sclerosis. IMPRESSION: Redemonstration of right hip arthroplasty with new antibiotic beads overlying the right femoroacetabular joint. New surgical drains and wound VAC overlying the lateral proximal right thigh including the area of the presumed abscess seen on yesterday's MRI. Electronically Signed   By: Neita Garnet M.D.   On: 03/21/2023 17:04        Scheduled Meds:  allopurinol  300 mg Oral Daily   apixaban  5 mg Oral BID   atorvastatin  80 mg Oral Daily   citalopram  20 mg Oral Daily   clopidogrel  75 mg Oral Daily   metoprolol tartrate  12.5 mg Oral BID   Continuous Infusions:  sodium chloride 30 mL/hr at 03/22/23 1108   ceFEPime (MAXIPIME) IV 2 g (03/22/23 1109)   [START ON 03/23/2023] vancomycin       LOS: 4 days    Time spent: 35 minutes    Darlin Drop, MD [facility] Pager [phone]  If 7PM-7AM, please contact night-coverage [email or web address]  03/22/2023, 6:00 PM

## 2023-03-23 ENCOUNTER — Other Ambulatory Visit: Payer: Self-pay

## 2023-03-23 DIAGNOSIS — T8451XA Infection and inflammatory reaction due to internal right hip prosthesis, initial encounter: Secondary | ICD-10-CM | POA: Diagnosis not present

## 2023-03-23 DIAGNOSIS — A498 Other bacterial infections of unspecified site: Secondary | ICD-10-CM | POA: Diagnosis not present

## 2023-03-23 DIAGNOSIS — A419 Sepsis, unspecified organism: Secondary | ICD-10-CM | POA: Diagnosis not present

## 2023-03-23 DIAGNOSIS — Z96641 Presence of right artificial hip joint: Secondary | ICD-10-CM | POA: Diagnosis not present

## 2023-03-23 DIAGNOSIS — D72829 Elevated white blood cell count, unspecified: Secondary | ICD-10-CM

## 2023-03-23 LAB — AEROBIC CULTURE W GRAM STAIN (SUPERFICIAL SPECIMEN)

## 2023-03-23 LAB — CBC
HCT: 22.5 % — ABNORMAL LOW (ref 36.0–46.0)
Hemoglobin: 7.5 g/dL — ABNORMAL LOW (ref 12.0–15.0)
MCH: 30.6 pg (ref 26.0–34.0)
MCHC: 33.3 g/dL (ref 30.0–36.0)
MCV: 91.8 fL (ref 80.0–100.0)
Platelets: 251 10*3/uL (ref 150–400)
RBC: 2.45 MIL/uL — ABNORMAL LOW (ref 3.87–5.11)
RDW: 16.6 % — ABNORMAL HIGH (ref 11.5–15.5)
WBC: 15.9 10*3/uL — ABNORMAL HIGH (ref 4.0–10.5)
nRBC: 0 % (ref 0.0–0.2)

## 2023-03-23 LAB — CULTURE, BLOOD (ROUTINE X 2)
Culture: NO GROWTH
Culture: NO GROWTH
Special Requests: ADEQUATE
Special Requests: ADEQUATE

## 2023-03-23 LAB — BASIC METABOLIC PANEL
Anion gap: 11 (ref 5–15)
BUN: 34 mg/dL — ABNORMAL HIGH (ref 8–23)
CO2: 22 mmol/L (ref 22–32)
Calcium: 8.6 mg/dL — ABNORMAL LOW (ref 8.9–10.3)
Chloride: 102 mmol/L (ref 98–111)
Creatinine, Ser: 1.43 mg/dL — ABNORMAL HIGH (ref 0.44–1.00)
GFR, Estimated: 37 mL/min — ABNORMAL LOW (ref >60)
Glucose, Bld: 167 mg/dL — ABNORMAL HIGH (ref 70–99)
Potassium: 4.7 mmol/L (ref 3.5–5.1)
Sodium: 135 mmol/L (ref 135–145)

## 2023-03-23 LAB — MAGNESIUM: Magnesium: 1.4 mg/dL — ABNORMAL LOW (ref 1.7–2.4)

## 2023-03-23 LAB — PHOSPHORUS: Phosphorus: 3.2 mg/dL (ref 2.5–4.6)

## 2023-03-23 LAB — CREATININE, SERUM
Creatinine, Ser: 1.36 mg/dL — ABNORMAL HIGH (ref 0.44–1.00)
GFR, Estimated: 39 mL/min — ABNORMAL LOW (ref >60)

## 2023-03-23 LAB — PREPARE RBC (CROSSMATCH)

## 2023-03-23 MED ORDER — SENNA 8.6 MG PO TABS
2.0000 | ORAL_TABLET | Freq: Every day | ORAL | Status: DC
  Administered 2023-03-23 – 2023-03-24 (×2): 17.2 mg via ORAL
  Filled 2023-03-23 (×3): qty 2

## 2023-03-23 MED ORDER — SODIUM CHLORIDE 0.9 % IV SOLN
2.0000 g | Freq: Every day | INTRAVENOUS | Status: DC
  Administered 2023-03-23 – 2023-04-02 (×11): 2 g via INTRAVENOUS
  Filled 2023-03-23 (×11): qty 20

## 2023-03-23 MED ORDER — POLYETHYLENE GLYCOL 3350 17 G PO PACK
17.0000 g | PACK | Freq: Every day | ORAL | Status: DC | PRN
  Administered 2023-03-23 – 2023-03-24 (×2): 17 g via ORAL
  Filled 2023-03-23 (×2): qty 1

## 2023-03-23 MED ORDER — SODIUM CHLORIDE 0.9% IV SOLUTION: Freq: Once | INTRAVENOUS | Status: AC

## 2023-03-23 NOTE — Progress Notes (Signed)
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  Mild R hip pain reported at rest beginning/end of session but pain increased with activity (pt pre-medicated with pain medication).  During session pt min assist semi-supine to sitting EOB; min to mod assist with transfers using RW; and CGA to min assist to ambulate 5 feet with RW use.  Limited activity d/t pt fatigue.  Will continue to focus on strengthening and progressive functional mobility during hospitalization.    If plan is discharge home, recommend the following: A lot of help with walking and/or transfers;A lot of help with bathing/dressing/bathroom;Assist for transportation;Help with stairs or ramp for entrance;Assistance with cooking/housework   Can travel by private vehicle     No  Equipment Recommendations  Rolling walker (2 wheels);BSC/3in1    Recommendations for Other Services       Precautions / Restrictions Precautions Precautions: Posterior Hip;Fall Precaution Comments: 2 hemovac drains from R hip Restrictions Weight Bearing Restrictions: Yes RLE Weight Bearing: Weight bearing as tolerated     Mobility  Bed Mobility Overal bed mobility: Needs Assistance Bed Mobility: Supine to Sit     Supine to sit: Min assist, HOB elevated, Used rails   General bed mobility comments: assist for trunk and R LE     Transfers Overall transfer level: Needs assistance Equipment used: Rolling walker (2 wheels) Transfers: Sit to/from Stand, Bed to chair/wheelchair/BSC Sit to Stand: Min assist, Mod assist (mod assist to stand from bed; min assist to stand from [facility])   Step pivot transfers: Min assist (stand step turn bed to [facility] with RW use)       General transfer comment: vc's for technique; assist to initiate stand and control descent sitting    Ambulation/Gait Ambulation/Gait assistance: Contact guard assist, Min assist Gait Distance (Feet): 5 Feet (BSC to recliner) Assistive device: Rolling walker (2 wheels)   Gait velocity: decreased     General Gait Details: antalgic; decreased stance time R LE; vc's for technique and walker use   Stairs             Wheelchair Mobility     Tilt Bed    Modified Rankin (Stroke Patients Only)       Balance Overall balance assessment: Needs assistance Sitting-balance support: No upper extremity supported, Feet supported Sitting balance-Leahy Scale: Good Sitting balance - Comments: steady reaching within BOS   Standing balance support: Bilateral upper extremity supported, Reliant on assistive device for balance Standing balance-Leahy Scale: Fair Standing balance comment: steady static standing                            Cognition Arousal: Alert Behavior During Therapy: WFL for tasks assessed/performed Overall Cognitive Status: Within Functional Limits for tasks assessed  Exercises      General Comments  Nursing cleared pt for participation in physical therapy.  Pt agreeable to PT session.  Pt's daughter present during session.      Pertinent Vitals/Pain Pain Assessment Pain Assessment: Faces Faces Pain Scale: Hurts little more Pain Location: R hip Pain Descriptors / Indicators: Discomfort, Sore Pain Intervention(s): Limited activity within patient's  tolerance, Monitored during session, Premedicated before session, Repositioned HR stable and WFL throughout treatment session.    Home Living                          Prior Function            PT Goals (current goals can now be found in the care plan section) Acute Rehab PT Goals Patient Stated Goal: to get stronger PT Goal Formulation: With patient/family Time For Goal Achievement: 04/05/23 Potential to Achieve Goals: Good Progress towards PT goals: Progressing toward goals    Frequency    BID      PT Plan      Co-evaluation              AM-PAC PT "6 Clicks" Mobility   Outcome Measure  Help needed turning from your back to your side while in a flat bed without using bedrails?: A Little Help needed moving from lying on your back to sitting on the side of a flat bed without using bedrails?: A Little Help needed moving to and from a bed to a chair (including a wheelchair)?: A Little Help needed standing up from a chair using your arms (e.g., wheelchair or bedside chair)?: A Lot Help needed to walk in hospital room?: A Lot Help needed climbing 3-5 steps with a railing? : A Lot 6 Click Score: 15    End of Session Equipment Utilized During Treatment: Gait belt Activity Tolerance: Patient limited by fatigue Patient left: in chair;with call bell/phone within reach;with chair alarm set;with family/visitor present;Other (comment) (pillows between pt's knees for posterior THP's; B heels floating via pillow support) Nurse Communication: Mobility status;Precautions;Weight bearing status (via white board) PT Visit Diagnosis: Other abnormalities of gait and mobility (R26.89);Unsteadiness on feet (R26.81);Muscle weakness (generalized) (M62.81);Difficulty in walking, not elsewhere classified (R26.2);Pain Pain - Right/Left: Right Pain - part of body: Hip     Time: 2130-8657 PT Time Calculation (min) (ACUTE ONLY): 39 min  Charges:    $Therapeutic Activity: 38-52  mins PT General Charges $$ ACUTE PT VISIT: 1 Visit                     Hendricks Limes, PT 03/23/23, 5:03 PM

## 2023-03-23 NOTE — Progress Notes (Signed)
   03/23/23 1100  Spiritual Encounters  Type of Visit Initial  Care provided to: Pt and family  Conversation partners present during Programmer, systems;Other (comment)  Referral source Family;Patient request  Reason for visit Advance directives  OnCall Visit No  Spiritual Framework  Presenting Themes Meaning/purpose/sources of inspiration;Goals in life/care  Patient Stress Factors Not reviewed  Family Stress Factors Major life changes  Interventions  Spiritual Care Interventions Made Established relationship of care and support;Compassionate presence;Reflective listening;Normalization of emotions  Intervention Outcomes  Outcomes Reduced anxiety;Awareness of support;Connection to values and goals of care  Spiritual Care Plan  Spiritual Care Issues Still Outstanding No further spiritual care needs at this time (see row info)   Family and patient wanted to get advance directive finish. Was able to secure the people to finish AD in a timely manner. Patient and family needs were met. Let family and patient know that a Chaplain is here 24/7

## 2023-03-23 NOTE — Plan of Care (Signed)
PMT is following. Goals set for DNR and full scope treatment with intubation for up to 1 week if needed. No feeding tube. MOST form has been completed. As per notes chaplain has completed AD packet at patient's request. PMT will continue to shadow for needs.

## 2023-03-23 NOTE — Evaluation (Addendum)
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 Pt is alert and oriented x4, agreeable to OT evaluation. PTA pt is generally MOD I in ADL, assist for IADLs; Pt is known to this service from previous admissions; Pt reports she was amb with a rollator household distances PTA. Pt presents with deficits in strength, endurance, activity tolerance, balance affecting safe and optimal ADL completion. Frequent vcs required for technique/posterior hip precautions. Pt would benefit from acute OT to address deficits and to facilitate return to PLOF. Pt is left in bedside chair, all needs met, vss throughout, nursing present.       If plan is discharge home, recommend the following: A lot of help with walking and/or transfers;A lot of help with bathing/dressing/bathroom;Help with stairs or ramp for entrance;Assistance with cooking/housework    Functional Status Assessment  Patient has had a recent decline in their functional status and demonstrates the ability to make significant improvements in function in a reasonable and predictable amount of time.  Equipment Recommendations  None recommended by OT (pt has recommended equipment)    Recommendations for Other Services       Precautions / Restrictions Precautions Precautions: Posterior Hip;Fall Precaution Comments: 2 hemovac drains from  R hip Restrictions Weight Bearing Restrictions: Yes RLE Weight Bearing: Weight bearing as tolerated LLE Weight Bearing: Weight bearing as tolerated      Mobility Bed Mobility Overal bed mobility: Needs Assistance Bed Mobility: Supine to Sit     Supine to sit: Min assist, HOB elevated, Used rails          Transfers Overall transfer level: Needs assistance   Transfers: Sit to/from Stand, Bed to chair/wheelchair/BSC Sit to Stand: Min assist     Step pivot transfers: Min assist, Mod assist     General transfer comment: frequent verbal cues for technique      Balance Overall balance assessment: Needs assistance Sitting-balance support: Feet supported Sitting balance-Leahy Scale: Good     Standing balance support: Bilateral upper extremity supported, During functional activity, Reliant on assistive device for balance Standing balance-Leahy Scale: Fair                             ADL either performed or assessed with clinical judgement   ADL Overall ADL's : Needs assistance/impaired Eating/Feeding: Set up;Sitting   Grooming: Wash/dry face;Oral care;Sitting;Set up Grooming Details (indicate cue type and reason): in chair         Upper Body Dressing : Minimal assistance;Sitting   Lower Body Dressing: Maximal assistance;Bed level Lower Body Dressing Details (indicate cue type and reason): socks Toilet Transfer: Minimal assistance;Rolling walker (2 wheels);Moderate assistance Toilet Transfer Details (indicate cue type and reason): simulated to bedside chair, increased assist with lines/drains Toileting- Clothing Manipulation and Hygiene: Moderate assistance;Sitting/lateral lean Toileting - Clothing Manipulation Details (indicate cue type and reason): anticipated  General ADL Comments: frequent vcs for adherence to posterior hip preacutions     Vision Baseline Vision/History: 1 Wears glasses Patient Visual Report: No change from baseline        Perception         Praxis         Pertinent Vitals/Pain Pain Assessment Pain Assessment: 0-10 Pain Score: 3  Pain Location: R hip Pain Descriptors / Indicators: Discomfort Pain Intervention(s): Limited activity within patient's tolerance, Monitored during session, Repositioned (RN in room to provide pain meds as needed)     Extremity/Trunk Assessment Upper Extremity Assessment Upper Extremity Assessment: Overall WFL for tasks assessed   Lower Extremity Assessment Lower Extremity Assessment: RLE deficits/detail RLE Deficits / Details: s/p R hip surgery RLE Coordination: decreased fine motor   Cervical / Trunk Assessment Cervical / Trunk Assessment: Normal   Communication Communication Communication: Hearing impairment;Other (comment) (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 per her report) Cueing Techniques: Verbal cues;Tactile cues   Cognition Arousal: Alert Behavior During Therapy: WFL for tasks assessed/performed Overall Cognitive Status: Within Functional Limits for tasks assessed                                       General Comments  all lines/leads/drains intact pre/post session; Pt with RLE internally rotated in bed, improved positioning with pillows while in chair, nurse notified    Exercises Other Exercises Other Exercises: edu pt and daugther re: role of acute OT this hospitalization, pt daugther reports pt needs to amb to the nurses station to simulate home set up   Shoulder Instructions      Home Living Family/patient expects to be discharged to:: Private residence Living Arrangements: Other (Comment) (grandson and his three children) Available Help at Discharge: Family;Available PRN/intermittently Type of Home: House Home Access: Ramped entrance     Home Layout: One level     Bathroom Shower/Tub: Chief Strategy Officer: Handicapped height Bathroom Accessibility: Yes   Home Equipment: Electrical engineer (4 wheels);Wheelchair - Forensic psychologist (2 wheels);Grab bars - toilet;Grab bars - tub/shower;Shower seat;Other (comment)          Prior Functioning/Environment Prior Level of Function : Needs assist;History of Falls (last six months)             Mobility Comments: MOD I with rollator ADLs Comments: generally MOD I for ADLs, assist for LB dressing from last hip surgery; assist for IADLs from family due to recent health issues        OT Problem List: Decreased strength;Impaired balance (sitting and/or standing);Decreased activity tolerance;Decreased knowledge of use of DME or AE;Decreased safety awareness      OT Treatment/Interventions: Self-care/ADL training;Therapeutic exercise;Patient/family education;Balance training;Energy conservation;DME and/or AE instruction;Therapeutic activities    OT Goals(Current goals can be found in the care plan section) Acute Rehab OT Goals Patient Stated Goal: go home OT Goal Formulation: With patient/family Time For Goal Achievement: 04/06/23 Potential to Achieve Goals: Fair ADL Goals Pt Will Perform Grooming: with modified independence;sitting;standing Pt Will Perform Lower Body Dressing: with modified independence;sit to/from stand Pt Will Transfer to Toilet: with modified independence;ambulating Pt Will Perform Toileting - Clothing Manipulation and hygiene: with modified independence;sit to/from stand  OT Frequency: Min 1X/week    Co-evaluation              AM-PAC OT "6 Clicks" Daily Activity     Outcome Measure Help from  another person eating meals?: None Help from another person taking care of personal grooming?: None Help from another person toileting, which includes using toliet, bedpan, or urinal?: A Lot Help from another person bathing (including washing, rinsing, drying)?: A Lot Help from another person to put on and taking off regular upper body clothing?: A Little Help from another person to  put on and taking off regular lower body clothing?: A Lot 6 Click Score: 17   End of Session Equipment Utilized During Treatment: Rolling walker (2 wheels) Nurse Communication: Mobility status  Activity Tolerance: Patient tolerated treatment well Patient left: in chair;with call bell/phone within reach;with chair alarm set;with nursing/sitter in room;with family/visitor present  OT Visit Diagnosis: Other abnormalities of gait and mobility (R26.89)                Time: 6213-0865 OT Time Calculation (min): 23 min Charges:  OT General Charges $OT Visit: 1 Visit OT Evaluation $OT Eval Moderate Complexity: 1 Mod  Oleta Mouse, OTD OTR/L  03/23/23, 11:24 AM

## 2023-03-23 NOTE — Consult Note (Signed)
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 female  Cad also had recent PCI on blood thinners, oa had  right tha placed on 01/18/23. Readmitted on 7/10 for acute onset of right sided neglect, had a embolic stroke, went to rehab from7/16-24, readmitted where she had left carotid vascular intervention -dischaged on8/12. Readmitted on 8/29 with fever, feeling poorly, new small swelling about the right hip, size of golf ball. 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 Or cutlures growing a pansensitive serratia marcescens. Currently on cefepime.  Past Medical History:  Diagnosis Date   Anxiety    a.) on BZO (alprazolam) PRN   Arthritis    fingers, right hip   Atrial fibrillation (HCC) 11/10/2013   a.) CHA2DS2VASc = 5 (age x2, sex, HTN, T2DM);  b.) rate/rhythm maintained on oral metoprolol tartrate; chronically anticoagulated with apixaban   Bronchitis    Cerebrovascular small vessel disease (chronic)    Coronary artery disease involving native coronary artery of native heart 01/11/2021   a.) LHC 01/11/2021: 40% oLM, 50% mLCx, 30% oLCx, 30% pLAD, 30% mLAD, 99% pRCA (2.75 x 30 mm Resolute Onyx DES), 75% p-mRCA, 60% dRCA   DDD (degenerative disc disease), lumbar 12/01/2013    Diastolic dysfunction 01/12/2021   a.) TTE 01/12/2021 (s/p inf STEMI): EF 55-60%, mild-mod LAE, mild RAE, mod RVE, mild AR/MR, mod TR, G1DD   Diet-controlled type 2 diabetes mellitus (HCC)    Full dentures    Gout    History of bilateral cataract extraction 2021   Hyperlipidemia    Hypertension, essential    Long term current use of anticoagulant    a.) apixaban   PONV (postoperative nausea and vomiting)    Sinoatrial node dysfunction (HCC) 11/10/2013   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) 01/11/2021   a.) LHC/PCI 01/11/2021 --> culprit lesion 99% pRCA --> 2.75 x 30 mm Resolute Onyx DES x 1   Varicose veins of legs    Wears hearing aid in both ears     Allergies:  Allergies  Allergen Reactions   Celebrex [Celecoxib] Diarrhea   Penicillins Itching    IgE = 154 (WNL) on 01/05/2023   Relafen [Nabumetone]     Palpitation    Lovastatin Diarrhea      MEDICATIONS:  allopurinol  300 mg Oral Daily   apixaban  5 mg Oral BID   atorvastatin  80 mg Oral Daily   citalopram  20 mg Oral Daily   clopidogrel  75 mg Oral Daily   metoprolol tartrate  12.5 mg Oral BID   senna  2 tablet Oral Daily    Social History   Tobacco Use   Smoking status: Former    Current  packs/day: 0.00    Types: Cigarettes    Quit date: 1980    Years since quitting: 44.7   Smokeless tobacco: Never  Vaping Use   Vaping status: Never Used  Substance Use Topics   Alcohol use: Not Currently   Drug use: Never    Family History  Problem Relation Age of Onset   Heart attack Mother 73       required open heart surgery. Onset age is unknown.   Stroke Father    Diabetes Father    Alcoholism Brother    Breast cancer Neg Hx      Review of Systems  Constitutional: Negative for fever, chills, diaphoresis, activity change, appetite change, fatigue and unexpected weight change. Hard of hearing wears hearing aid HENT: Negative for congestion, sore throat, rhinorrhea, sneezing,  trouble swallowing and sinus pressure.  Eyes: Negative for photophobia and visual disturbance.  Respiratory: Negative for cough, chest tightness, shortness of breath, wheezing and stridor.  Cardiovascular: Negative for chest pain, palpitations and leg swelling.  Gastrointestinal: Negative for nausea, vomiting, abdominal pain, diarrhea, constipation, blood in stool, abdominal distention and anal bleeding.  Genitourinary: Negative for dysuria, hematuria, flank pain and difficulty urinating.  Musculoskeletal:+right hip pain Skin: Negative for color change, pallor, rash and wound.  Neurological: Negative for dizziness, tremors, weakness and light-headedness.  Hematological: Negative for adenopathy. Does not bruise/bleed easily.  Psychiatric/Behavioral: Negative for behavioral problems, confusion, sleep disturbance, dysphoric mood, decreased concentration and agitation.    OBJECTIVE: Temp:  [97.9 F (36.6 C)-98.5 F (36.9 C)] 97.9 F (36.6 C) (09/03 1055) Pulse Rate:  [68-89] 68 (09/03 1055) Resp:  [16-18] 16 (09/03 1055) BP: (112-131)/(50-73) 112/52 (09/03 1055) SpO2:  [98 %-100 %] 100 % (09/03 1055) Weight:  [92.9 kg] 92.9 kg (09/03 0418) Physical Exam  Constitutional:  oriented to person, place, and time. appears well-developed and well-nourished. No distress.  HENT: [place]/AT, PERRLA, no scleral icterus Mouth/Throat: Oropharynx is clear and moist. No oropharyngeal exudate.  Cardiovascular: Normal rate, regular rhythm and normal heart sounds. Exam reveals no gallop and no friction rub.  No murmur heard.  Pulmonary/Chest: Effort normal and breath sounds normal. No respiratory distress.  has no wheezes.  Neck = supple, no nuchal rigidity Abdominal: Soft. Bowel sounds are normal.  exhibits no distension. There is no tenderness.  Right hip = bandaged with 2 drains in place Lymphadenopathy: no cervical adenopathy. No axillary adenopathy Neurological: alert and oriented to person, place, and  time.  Skin: Skin is warm and dry. No rash noted. No erythema.  Psychiatric: a normal mood and affect.  behavior is normal.    LABS: Results for orders placed or performed during the hospital encounter of 03/18/23 (from the past 48 hour(s))  Prepare RBC (crossmatch)     Status: None   Collection Time: 03/21/23  2:38 PM  Result Value Ref Range   Order Confirmation      ORDER PROCESSED BY BLOOD BANK Performed at [facility], [address] [place], [place] [address]   Basic metabolic panel     Status: Abnormal   Collection Time: 03/22/23  5:19 AM  Result Value Ref Range   Sodium 136 135 - 145 mmol/L   Potassium 4.1 3.5 - 5.1 mmol/L   Chloride 105 98 - 111 mmol/L   CO2 23 22 - 32 mmol/L   Glucose, Bld 212 (H) 70 - 99 mg/dL    Comment: Glucose reference range applies only to samples taken after fasting for at least 8  hours.   BUN 25 (H) 8 - 23 mg/dL   Creatinine, Ser 1.61 (H) 0.44 - 1.00 mg/dL   Calcium 8.4 (L) 8.9 - 10.3 mg/dL   GFR, Estimated 42 (L) >60 mL/min    Comment: (NOTE) Calculated using the CKD-EPI Creatinine Equation (2021)    Anion gap 8 5 - 15    Comment: Performed at [facility], [address] [place], [place] [address]  CBC     Status: Abnormal   Collection Time: 03/22/23  5:19 AM  Result Value Ref Range   WBC 9.3 4.0 - 10.5 K/uL   RBC 3.05 (L) 3.87 - 5.11 MIL/uL   Hemoglobin 9.2 (L) 12.0 - 15.0 g/dL   HCT 54.0 (L) 98.1 - 19.1 %   MCV 91.8 80.0 - 100.0 fL   MCH 30.2 26.0 - 34.0 pg   MCHC 32.9 30.0 - 36.0 g/dL   RDW 47.8 (H) 29.5 - 62.1 %   Platelets 254 150 - 400 K/uL   nRBC 0.0 0.0 - 0.2 %    Comment: Performed at [facility], [address] [place], [place] [address]  Creatinine, serum     Status: Abnormal   Collection Time: 03/23/23  5:05 AM  Result Value Ref Range   Creatinine, Ser 1.36 (H) 0.44 - 1.00 mg/dL   GFR, Estimated 39 (L) >60 mL/min    Comment: (NOTE) Calculated using the CKD-EPI Creatinine Equation  (2021) Performed at [facility], [address] [place], [place] [address]   CBC     Status: Abnormal   Collection Time: 03/23/23  5:05 AM  Result Value Ref Range   WBC 15.9 (H) 4.0 - 10.5 K/uL   RBC 2.45 (L) 3.87 - 5.11 MIL/uL   Hemoglobin 7.5 (L) 12.0 - 15.0 g/dL   HCT 62.9 (L) 52.8 - 41.3 %   MCV 91.8 80.0 - 100.0 fL   MCH 30.6 26.0 - 34.0 pg   MCHC 33.3 30.0 - 36.0 g/dL   RDW 24.4 (H) 01.0 - 27.2 %   Platelets 251 150 - 400 K/uL   nRBC 0.0 0.0 - 0.2 %    Comment: Performed at [facility], [address] [place], [place] [address]  Basic metabolic panel     Status: Abnormal   Collection Time: 03/23/23  5:05 AM  Result Value Ref Range   Sodium 135 135 - 145 mmol/L   Potassium 4.7 3.5 - 5.1 mmol/L   Chloride 102 98 - 111 mmol/L   CO2 22 22 - 32 mmol/L   Glucose, Bld 167 (H) 70 - 99 mg/dL    Comment: Glucose reference range applies only to samples taken after fasting for at least 8 hours.   BUN 34 (H) 8 - 23 mg/dL   Creatinine, Ser 4.03 (H) 0.44 - 1.00 mg/dL   Calcium 8.6 (L) 8.9 - 10.3 mg/dL   GFR, Estimated 37 (L) >60 mL/min    Comment: (NOTE) Calculated using the CKD-EPI Creatinine Equation (2021)    Anion gap 11 5 - 15    Comment: Performed at [facility], [address] [place], [place] [address]  Magnesium     Status: Abnormal   Collection Time: 03/23/23  5:05 AM  Result Value Ref Range   Magnesium 1.4 (L) 1.7 - 2.4 mg/dL    Comment: Performed at [facility], [address] [place], [place] [address]  Phosphorus     Status: None   Collection Time: 03/23/23  5:05 AM  Result Value Ref Range   Phosphorus 3.2 2.5 - 4.6 mg/dL    Comment: Performed at [facility], [address] [place], [place] [address]    MICRO: Serratia marcescens      MIC    CEFEPIME <=0.12 SENS... Sensitive    CEFTAZIDIME <=1 SENSITIVE Sensitive    CEFTRIAXONE <=0.25 SENS... Sensitive    CIPROFLOXACIN <=0.25 SENS... Sensitive     GENTAMICIN <=1 SENSITIVE Sensitive    TRIMETH/SULFA <=20 SENSIT... Sensitive    IMAGING: DG Pelvis 1-2 Views  Result Date: 03/21/2023 CLINICAL DATA:  Postoperative state. EXAM: PELVIS - 1-2 VIEW COMPARISON:  MRI right femur 03/20/2023, pelvis and right femur radiographs 03/20/2023 FINDINGS: Status post right hip arthroplasty. There are new surgical skin staples overlying the lateral right hip/thigh, lateral to the presumed subcutaneous fat abscess described on yesterday's MRI. There is a postsurgical drain and a wound VAC overlying the lateral right hip/proximal thigh. There are presumed antibiotic beads overlying the right femoroacetabular joint. No perihardware lucency is seen to indicate hardware failure or loosening. Mild pubic symphysis joint space narrowing, subchondral sclerosis, and peripheral osteophytosis. Mild bilateral sacroiliac subchondral sclerosis. IMPRESSION: Redemonstration of right hip arthroplasty with new antibiotic beads overlying the right femoroacetabular joint. New surgical drains and wound VAC overlying the lateral proximal right thigh including the area of the presumed abscess seen on yesterday's MRI. Electronically Signed   By: Neita Garnet M.D.   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 OR cx + serratia  - currently on cefepime; plan to change to ceftriaxone 2gm IV daily - will place order for picc line  - plan for 6 wk with ceftriaxone then transition to orals for remaining course 3 months - will check sed rate and crp  Leukocytosis = anticipate to improve.  Dispo = defer to orthopedics if needs to go to rehab prior to going to home to finish iv abtx course  Atrial fib/history of stroke = restarted on eliquis today.will watch h/h  Diagnosis: pji  Culture Result: serratia  Allergies  Allergen  Reactions   Celebrex [Celecoxib] Diarrhea   Penicillins Itching    IgE = 154 (WNL) on 01/05/2023   Relafen [Nabumetone]     Palpitation    Lovastatin Diarrhea    OPAT Orders Discharge antibiotics to be given via PICC line Discharge antibiotics: Per pharmacy protocol ceftriaxone 2gm IV daily  Duration: 6 wk End Date: May 03, 2023  [facility] Care Per Protocol:  Home health RN for IV administration and teaching; PICC line care and labs.    Labs weekly while on IV antibiotics: _x_ CBC with differential _x_ BMP __ CMP __x CRP __x ESR  _x_ Please pull PIC at completion of IV antibiotics __ Please leave PIC in place until doctor has seen patient or been notified  Fax weekly labs to [phone]  Clinic Follow Up Appt: In 4-6 wk  @ dr Safi Culotta or dr Rivka Safer in 4-6 wk

## 2023-03-23 NOTE — Progress Notes (Incomplete)
Pharmacy Antibiotic Note  Brooke Davis is a 82 y.o. female admitted on 03/18/2023 with sepsis.  Pharmacy has been consulted for Vanc, cefepime dosing.  Orthopedics was consulted d/t concern for swelling of patient's right hip area. Although exam was consistent for hematoma, possible infectious source. Would like to continue broad spectrum antibiotics for now until patient is taken to OR for I&D of abscess  Plan: Continue Cefepime 2 gm IV Q12H ordered to start on 8/30 @ 1130.  Vancomycin 1250 gm IV Q48H9(renal function slightly improved). Goal AUC 400-550. Expected AUC: 529 Expected Cmin: 11.0 SCr used: 1.17, Vd used: 0.5   Height: 5\' 2"  (157.5 cm) Weight: 92.9 kg (204 lb 12.9 oz) IBW/kg (Calculated) : 50.1  Temp (24hrs), Avg:98.3 F (36.8 C), Min:98 F (36.7 C), Max:98.5 F (36.9 C)  Recent Labs  Lab 03/18/23 2134 03/18/23 2216 03/19/23 0142 03/20/23 0605 03/21/23 0433 03/22/23 0519 03/23/23 0505  WBC 9.8  --   --  7.8 8.4 9.3 15.9*  CREATININE  --  1.27*  --  1.17* 1.17* 1.28* 1.43*  1.36*  LATICACIDVEN 1.7  --  1.2  --   --   --   --     Estimated Creatinine Clearance: 35 mL/min (A) (by C-G formula based on SCr of 1.36 mg/dL (H)).    Allergies  Allergen Reactions   Celebrex [Celecoxib] Diarrhea   Penicillins Itching    IgE = 154 (WNL) on 01/05/2023   Relafen [Nabumetone]     Palpitation    Lovastatin Diarrhea    Antimicrobials this admission: Vancomycin 8/30 >>  Cefepime   8/30 >>   Dose adjustments this admission:   Microbiology results:  BCx: NGTD  MRSA PCR: negative  Thank you for allowing pharmacy to be a part of this patient's care.  Jaynie Bream 03/23/2023 10:48 AM

## 2023-03-23 NOTE — Progress Notes (Signed)
1      PROGRESS NOTE    Brooke Davis  WGN:[id number] DOB: May 04, 1941 DOA: 03/18/2023 PCP: [name], MD    Brief Narrative:   82 y.o. female with medical history significant for DM2, HTN, CAD s/p PCI and stent on Plavix, dCHF, chronic A fib on Eliquis, and chronic anxiety, hospitalized in July 2024 with an acute embolic stroke while on Eliquis, with workup showing left carotid artery stenosis s/p L carotid stent placement on 02/25/23 and undergoing total right hip replacement a couple weeks later on 03/09/23,  who presents to the ED with a 3-day history of fever, nausea, dizziness, diarrhea, R hip pain and swelling.    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  Status post right hip irrigation and debridement on 03/21/2023 by orthopedic surgery Dr. Clemencia Course.  Infectious disease consulted and following.  Joint fluid culture growing rare Serratia Marcences, IV antibiotics de-escalated from cefepime and IV vancomycin to Rocephin on 03/23/2023, under the guidance of infectious disease.  03/23/2023: Seen and examined at bedside.  No acute distress.  No new complaints.  Patient is currently on IV antibiotics, DOAC, and antiplatelet.  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 Clemencia Course on 03/21/2023. Joint fluid culture growing rare Serratia Marcences, IV antibiotics de-escalated from cefepime and IV vancomycin to Rocephin on 03/23/2023, under the guidance of  infectious disease. Continue as needed analgesics Appreciate infectious disease assistance.  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 Appreciate orthopedic surgery's assistance.   Generalized weakness secondary to sepsis PT OT with the guidance of orthopedic surgery. 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   Curb sided with vascular surgery, Dr. Wyn Quaker, recommended to restart Eliquis and Plavix within 48 hours of I&D done on 03/21/2023.   Chronic diastolic CHF (congestive heart failure) (HCC) Euvolemic on exam. Hold Entresto, metoprolol and torsemide due to soft BPs Continue strict I's and O's and daily weight   Atrial fibrillation, chronic (HCC) Chronic anticoagulation Currently rate controlled, on Eliquis for secondary CVA prevention. Home metoprolol on hold to avoid hypotension due to soft BPs   CAD S/P percutaneous coronary angioplasty Continue atorvastatin and Plavix   Anxiety and depression Continue citalopram and alprazolam   Primary hypertension Hold off home oral antihypertensives due to soft BPs Continue to closely monitor vital signs.  Goals of care: Currently DNR per the patient herself.  Time: 50 minutes.  DVT prophylaxis:  apixaban (ELIQUIS) tablet 5 mg     Code Status: (Full code Family Communication: Updated the patient's daughters at bedside.    Disposition Plan: Home with home health versus SNF depending on clinical progress   Consultants:  Orthopedics Curb sided  with vascular surgery, Dr. Wyn Quaker. Infectious disease.    Antimicrobials:  IV cefepime and vancomycin, stopped on 03/23/2023 Rocephin started on  04/09/2023.     Objective: Vitals:   03/22/23 2153 03/23/23 0043 03/23/23 0418 03/23/23 1055  BP: (!) 114/51 131/73  (!) 112/52  Pulse: 88 85  68  Resp: 18 17  16   Temp: 98.5 F (36.9 C) 98.2 F (36.8 C)  97.9 F (36.6 C)  TempSrc: Oral     SpO2: 98% 100%  100%  Weight:   92.9 kg   Height:        Intake/Output Summary (Last 24 hours) at 03/23/2023 1617 Last data filed at 03/23/2023 1153 Gross per 24 hour  Intake 240 ml  Output 1000 ml  Net -760 ml   Filed Weights   03/19/23 0753 03/21/23 0500 03/23/23 0418  Weight: 83.8 kg 85.6 kg 92.9 kg    Examination:  General exam: Appears calm and comfortable.  Interactive. In no acute distress.  Alert and oriented x 3. Respiratory system: Clear to auscultation. Respiratory effort normal. Cardiovascular system: Irregular rate and rhythm no rubs or gallops.   Gastrointestinal system: Abdomen is nondistended, soft and nontender. No organomegaly or masses felt. Normal bowel sounds heard. Central nervous system: Alert and oriented.  No focal neurological deficits. Extremities: Right hip covered with surgical bandage. Skin: As above listed in the extremities Psychiatry: Mood is appropriate for condition setting.    Data Reviewed: I have personally reviewed following labs and imaging studies  CBC: Recent Labs  Lab 03/18/23 2134 03/20/23 0605 03/21/23 0433 03/22/23 0519 03/23/23 0505  WBC 9.8 7.8 8.4 9.3 15.9*  NEUTROABS 8.8*  --   --   --   --   HGB 9.1* 8.2* 8.6* 9.2* 7.5*  HCT 27.9* 25.6* 26.6* 28.0* 22.5*  MCV 92.1 93.1 92.0 91.8 91.8  PLT 290 218 250 254 251   Basic Metabolic Panel: Recent Labs  Lab 03/18/23 2216 03/20/23 0605 03/21/23 0433 03/22/23 0519 03/23/23 0505  NA 134* 136 134* 136 135  K 3.3* 3.6 3.5 4.1 4.7  CL 96* 103 100 105 102  CO2 26 26 26 23 22   GLUCOSE 146* 155* 111* 212* 167*  BUN 18 17 17  25* 34*  CREATININE 1.27* 1.17* 1.17* 1.28* 1.43*  1.36*  CALCIUM 8.7* 8.3* 8.3* 8.4* 8.6*  MG  --   1.4*  --   --  1.4*  PHOS  --   --   --   --  3.2   GFR: Estimated Creatinine Clearance: 35 mL/min (A) (by C-G formula based on SCr of 1.36 mg/dL (H)). Liver Function Tests: Recent Labs  Lab 03/18/23 2216  AST 19  ALT 12  ALKPHOS 85  BILITOT 0.9  PROT 6.5  ALBUMIN 2.9*   No results for input(s): "LIPASE", "AMYLASE" in the last 168 hours. No results for input(s): "AMMONIA" in the last 168 hours. Coagulation Profile: Recent Labs  Lab 03/18/23 2216  INR 2.2*   Sepsis Labs: Recent Labs  Lab 03/18/23 2134 03/19/23 0142  PROCALCITON  --  0.46  LATICACIDVEN 1.7 1.2    Recent Results (from the past 240 hour(s))  Blood Culture (routine x 2)     Status: None   Collection Time: 03/18/23  9:34 PM   Specimen: BLOOD RIGHT HAND  Result Value Ref Range Status   Specimen Description BLOOD RIGHT HAND  Final   Special Requests   Final    BOTTLES DRAWN AEROBIC ONLY Blood Culture  adequate volume   Culture   Final    NO GROWTH 5 DAYS Performed at [facility], [address] [place], [place] [address]    Report Status 03/23/2023 FINAL  Final  Blood Culture (routine x 2)     Status: None   Collection Time: 03/18/23  9:36 PM   Specimen: BLOOD LEFT HAND  Result Value Ref Range Status   Specimen Description BLOOD LEFT HAND  Final   Special Requests   Final    BOTTLES DRAWN AEROBIC AND ANAEROBIC Blood Culture adequate volume   Culture   Final    NO GROWTH 5 DAYS Performed at [facility], [address] [place], [place] [address]    Report Status 03/23/2023 FINAL  Final  Resp panel by RT-PCR (RSV, Flu A&B, Covid) Anterior Nasal Swab     Status: None   Collection Time: 03/18/23 10:28 PM   Specimen: Anterior Nasal Swab  Result Value Ref Range Status   SARS Coronavirus 2 by RT PCR NEGATIVE NEGATIVE Final    Comment: (NOTE) SARS-CoV-2 target nucleic acids are NOT DETECTED.  The SARS-CoV-2 RNA is generally detectable in upper respiratory specimens during the  acute phase of infection. The lowest concentration of SARS-CoV-2 viral copies this assay can detect is 138 copies/mL. A negative result does not preclude SARS-Cov-2 infection and should not be used as the sole basis for treatment or other patient management decisions. A negative result may occur with  improper specimen collection/handling, submission of specimen other than nasopharyngeal swab, presence of viral mutation(s) within the areas targeted by this assay, and inadequate number of viral copies(<138 copies/mL). A negative result must be combined with clinical observations, patient history, and epidemiological information. The expected result is Negative.  Fact Sheet for Patients:  BloggerCourse.com  Fact Sheet for Healthcare Providers:  SeriousBroker.it  This test is no t yet approved or cleared by the [place] FDA and  has been authorized for detection and/or diagnosis of SARS-CoV-2 by FDA under an Emergency Use Authorization (EUA). This EUA will remain  in effect (meaning this test can be used) for the duration of the COVID-19 declaration under Section 564(b)(1) of the Act, 21 U.S.C.section 360bbb-3(b)(1), unless the authorization is terminated  or revoked sooner.       Influenza A by PCR NEGATIVE NEGATIVE Final   Influenza B by PCR NEGATIVE NEGATIVE Final    Comment: (NOTE) The Xpert Xpress SARS-CoV-2/FLU/RSV plus assay is intended as an aid in the diagnosis of influenza from Nasopharyngeal swab specimens and should not be used as a sole basis for treatment. Nasal washings and aspirates are unacceptable for Xpert Xpress SARS-CoV-2/FLU/RSV testing.  Fact Sheet for Patients: BloggerCourse.com  Fact Sheet for Healthcare Providers: SeriousBroker.it  This test is not yet approved or cleared by the [place] FDA and has been authorized for detection and/or  diagnosis of SARS-CoV-2 by FDA under an Emergency Use Authorization (EUA). This EUA will remain in effect (meaning this test can be used) for the duration of the COVID-19 declaration under Section 564(b)(1) of the Act, 21 U.S.C. section 360bbb-3(b)(1), unless the authorization is terminated or revoked.     Resp Syncytial Virus by PCR NEGATIVE NEGATIVE Final    Comment: (NOTE) Fact Sheet for Patients: BloggerCourse.com  Fact Sheet for Healthcare Providers: SeriousBroker.it  This test is not yet approved or cleared by the [place] FDA and has been authorized for detection and/or diagnosis of SARS-CoV-2 by FDA under an Emergency Use Authorization (EUA). This  EUA will remain in effect (meaning this test can be used) for the duration of the COVID-19 declaration under Section 564(b)(1) of the Act, 21 U.S.C. section 360bbb-3(b)(1), unless the authorization is terminated or revoked.  Performed at [facility], [address] [place], [place] [address]   MRSA Next Gen by PCR, Nasal     Status: None   Collection Time: 03/19/23 11:02 AM   Specimen: Nasal Mucosa; Nasal Swab  Result Value Ref Range Status   MRSA by PCR Next Gen NOT DETECTED NOT DETECTED Final    Comment: (NOTE) The GeneXpert MRSA Assay (FDA approved for NASAL specimens only), is one component of a comprehensive MRSA colonization surveillance program. It is not intended to diagnose MRSA infection nor to guide or monitor treatment for MRSA infections. Test performance is not FDA approved in patients less than 38 years old. Performed at [facility], [address] [place], [place] [address]   Aerobic Culture w Gram Stain (superficial specimen)     Status: None   Collection Time: 03/21/23 12:00 PM   Specimen: Wound  Result Value Ref Range Status   Specimen Description   Final    WOUND Performed at [facility], [address]  [place], [place] [address]    Special Requests   Final    NONE Performed at [facility], [address] [place], [place] [address]    Gram Stain   Final    MODERATE WBC PRESENT,BOTH PMN AND MONONUCLEAR NO ORGANISMS SEEN    Culture   Final    RARE SERRATIA MARCESCENS SUSCEPTIBILITIES PERFORMED ON PREVIOUS CULTURE WITHIN THE LAST 5 DAYS. Performed at [facility], [address] [place], [place] [address]    Report Status 03/23/2023 FINAL  Final  Aerobic/Anaerobic Culture w Gram Stain (surgical/deep wound)     Status: None (Preliminary result)   Collection Time: 03/21/23 12:01 PM   Specimen: Wound; Body Fluid  Result Value Ref Range Status   Specimen Description   Final    WOUND Performed at [facility], [address], [place], [place] [address]    Special Requests   Final    NONE Performed at [facility], [address] [place], [place] [address]    Gram Stain   Final    FEW WBC PRESENT,BOTH PMN AND MONONUCLEAR NO ORGANISMS SEEN Performed at [facility], [address], [place], [place] [address]    Culture   Final    RARE SERRATIA MARCESCENS CULTURE REINCUBATED FOR BETTER GROWTH NO ANAEROBES ISOLATED; CULTURE IN PROGRESS FOR 5 DAYS    Report Status PENDING  Incomplete  Aerobic Culture w Gram Stain (superficial specimen)     Status: None   Collection Time: 03/21/23 12:01 PM   Specimen: Wound  Result Value Ref Range Status   Specimen Description   Final    WOUND Performed at [facility], [address], [place], [place] [address]    Special Requests   Final    NONE Performed at [facility], [address] [place], [place] [address]    Gram Stain   Final    FEW WBC PRESENT,BOTH PMN AND MONONUCLEAR NO ORGANISMS SEEN Performed at [facility], [address], [place], [place] [address]    Culture RARE SERRATIA MARCESCENS  Final   Report Status 03/23/2023 FINAL  Final   Organism ID, Bacteria  SERRATIA MARCESCENS  Final      Susceptibility  Serratia marcescens - MIC*    CEFEPIME <=0.12 SENSITIVE Sensitive     CEFTAZIDIME <=1 SENSITIVE Sensitive     CEFTRIAXONE <=0.25 SENSITIVE Sensitive     CIPROFLOXACIN <=0.25 SENSITIVE Sensitive     GENTAMICIN <=1 SENSITIVE Sensitive     TRIMETH/SULFA <=20 SENSITIVE Sensitive     * RARE SERRATIA MARCESCENS         Radiology Studies: No results found.      Scheduled Meds:  allopurinol  300 mg Oral Daily   apixaban  5 mg Oral BID   atorvastatin  80 mg Oral Daily   citalopram  20 mg Oral Daily   clopidogrel  75 mg Oral Daily   metoprolol tartrate  12.5 mg Oral BID   senna  2 tablet Oral Daily   Continuous Infusions:  sodium chloride 30 mL/hr at 03/22/23 1108   cefTRIAXone (ROCEPHIN)  IV       LOS: 5 days    Time spent: 35 minutes    Darlin Drop, MD [facility] Pager [phone]  If 7PM-7AM, please contact night-coverage [email or web address]  03/23/2023, 4:17 PM

## 2023-03-23 NOTE — Progress Notes (Signed)
Physical Therapy Treatment Patient Details Name: Brooke Davis MRN: [id number] DOB: 11-Jan-1941 Today's Date: 03/23/2023   History of Present Illness Pt is an 82 year old female s/p washout of R thigh collection with washout of total hip arthroplasty with poly exchange and head exchange; PMH significant for DM2, HTN, CAD s/p PCI and stent, dCHF A fib on Eliquis, hospitalized in July 2024 for elective hip replacement, again hospitalized in July 2024 with an acute embolic stroke while on Eliquis, with workup showing left carotid artery stenosis, s/p carotid stent placement on 02/25/23    PT Comments  Pt resting in recliner upon PT arrival; agreeable to therapy.  Pt very HOH (pt did not have hearing aids beginning of session but pt's daughter brought them towards end of session).  During session pt mod assist for transfers and CGA to min assist to ambulate 5 feet with RW use.  Pt requiring vc's for posterior THP's.  Will continue to progress pt with strengthening and progressive functional mobility per pt tolerance.     If plan is discharge home, recommend the following: A lot of help with walking and/or transfers;A lot of help with bathing/dressing/bathroom;Assist for transportation;Help with stairs or ramp for entrance;Assistance with cooking/housework   Can travel by private vehicle     No  Equipment Recommendations  Rolling walker (2 wheels);BSC/3in1    Recommendations for Other Services       Precautions / Restrictions Precautions Precautions: Posterior Hip;Fall Precaution Comments: 2 hemovac drains from R hip Restrictions Weight Bearing Restrictions: Yes RLE Weight Bearing: Weight bearing as tolerated    Mobility  Bed Mobility Overal bed mobility: Needs Assistance Bed Mobility: Sit to Supine       Sit to supine: Min assist, +2 for physical assistance   General bed mobility comments: assist for trunk and B LE's    Transfers Overall transfer level: Needs  assistance Equipment used: Rolling walker (2 wheels) Transfers: Sit to/from Stand, Bed to chair/wheelchair/BSC Sit to Stand: Mod assist (x2 trials from recliner)   Step pivot transfers: Min assist, Mod assist (stand step turn recliner to bed with RW use)       General transfer comment: vc's for technique    Ambulation/Gait Ambulation/Gait assistance: Contact guard assist, Min assist Gait Distance (Feet): 5 Feet Assistive device: Rolling walker (2 wheels)   Gait velocity: decreased     General Gait Details: antalgic; decreased stance time R LE; vc's for technique and walker use   Stairs             Wheelchair Mobility     Tilt Bed    Modified Rankin (Stroke Patients Only)       Balance Overall balance assessment: Needs assistance Sitting-balance support: No upper extremity supported, Feet supported Sitting balance-Leahy Scale: Good Sitting balance - Comments: steady reaching within BOS   Standing balance support: Bilateral upper extremity supported, Reliant on assistive device for balance Standing balance-Leahy Scale: Fair Standing balance comment: steady static standing                            Cognition Arousal: Alert Behavior During Therapy: WFL for tasks assessed/performed Overall Cognitive Status: Within Functional Limits for tasks assessed                                          Exercises  General Comments General comments (skin integrity, edema, etc.): All lines/leads/drains appearing intact pre/post session except bleeding noted from R elbow IV site (manual pressure applied; unable to reach pt's nurse but MD arrived and wrapped site to apply pressure and then nurse arrived to address concerns)      Pertinent Vitals/Pain Pain Assessment Pain Assessment: 0-10 Faces Pain Scale: Hurts a little bit (2/10 at rest; 6/10 with activity) Pain Location: R hip Pain Descriptors / Indicators: Discomfort, Tender,  Sore Pain Intervention(s): Limited activity within patient's tolerance, Monitored during session, Repositioned Vitals (HR and SpO2 on room air) stable and WFL throughout treatment session.    Home Living            Prior Function            PT Goals (current goals can now be found in the care plan section) Acute Rehab PT Goals Patient Stated Goal: to get stronger PT Goal Formulation: With patient/family Time For Goal Achievement: 04/05/23 Potential to Achieve Goals: Good Progress towards PT goals: Progressing toward goals    Frequency    Min 1X/week      PT Plan      Co-evaluation              AM-PAC PT "6 Clicks" Mobility   Outcome Measure  Help needed turning from your back to your side while in a flat bed without using bedrails?: A Little Help needed moving from lying on your back to sitting on the side of a flat bed without using bedrails?: A Little Help needed moving to and from a bed to a chair (including a wheelchair)?: A Little Help needed standing up from a chair using your arms (e.g., wheelchair or bedside chair)?: A Lot Help needed to walk in hospital room?: A Lot Help needed climbing 3-5 steps with a railing? : A Lot 6 Click Score: 15    End of Session Equipment Utilized During Treatment: Gait belt Activity Tolerance: Patient limited by fatigue Patient left: in bed;with call bell/phone within reach;with bed alarm set;with family/visitor present;Other (comment) (pillows between pt's knees for posterior THP's; B heels floating via pillows) Nurse Communication: Mobility status;Precautions;Weight bearing status (Pt's R elbow site bleeding and pt requesting clean-up d/t possible urinary leakage concerns) PT Visit Diagnosis: Other abnormalities of gait and mobility (R26.89);Unsteadiness on feet (R26.81);Muscle weakness (generalized) (M62.81);Difficulty in walking, not elsewhere classified (R26.2);Pain Pain - Right/Left: Right Pain - part of body: Hip      Time: 1308-6578 PT Time Calculation (min) (ACUTE ONLY): 38 min  Charges:    $Therapeutic Activity: 38-52 mins PT General Charges $$ ACUTE PT VISIT: 1 Visit                     Hendricks Limes, PT 03/23/23, 12:12 PM

## 2023-03-23 NOTE — Progress Notes (Addendum)
Orthopedic progress note  Chief complaint infected right total hip arthroplasty with fluid collection in the thigh  Subjective, no acute events  Objective, chart reviewed On cefepime and vanc Cultures pending  Superficial drain output in 24 hours 35 cc Deep drain output in 24 hours 30 cc Hemoglobin 7.5 this morning from 9 White blood cell count 15 from 9  Dressings are clean and dry. Exam is stable.  The thigh is soft and compressible.  Neuro vastly intact distally.  No concerns on the exam.  Assessment and plan  82 year old female postoperative day #2 status post washout of right thigh collection with washout of total hip arthroplasty with poly exchange and head exchange.  Her drains are in place.  May consider removal in the next 24 hours depending on output. Her hemoglobin dropped today.  There is no sign of bleeding at the surgical site.  Consider transfusion with a goal to keep hemoglobin greater than 8.  Weightbearing as tolerated with posterior hip precautions Mobilization as tolerated   I am off service today.  Please page orthopedics with any questions or concerns.  We will continue to follow.   Thank you.    Nada Boozer, [facility]

## 2023-03-23 NOTE — Plan of Care (Signed)
  Problem: Fluid Volume: Goal: Hemodynamic stability will improve Outcome: Progressing   Problem: Clinical Measurements: Goal: Will remain free from infection Outcome: Progressing   Problem: Activity: Goal: Risk for activity intolerance will decrease Outcome: Progressing   Problem: Coping: Goal: Level of anxiety will decrease Outcome: Progressing   Problem: Pain Managment: Goal: General experience of comfort will improve Outcome: Progressing   Problem: Safety: Goal: Ability to remain free from injury will improve Outcome: Progressing   Problem: Skin Integrity: Goal: Risk for impaired skin integrity will decrease Outcome: Progressing

## 2023-03-24 ENCOUNTER — Other Ambulatory Visit: Payer: Self-pay

## 2023-03-24 DIAGNOSIS — Z7901 Long term (current) use of anticoagulants: Secondary | ICD-10-CM | POA: Diagnosis not present

## 2023-03-24 DIAGNOSIS — A498 Other bacterial infections of unspecified site: Secondary | ICD-10-CM | POA: Diagnosis not present

## 2023-03-24 DIAGNOSIS — T8451XA Infection and inflammatory reaction due to internal right hip prosthesis, initial encounter: Secondary | ICD-10-CM | POA: Diagnosis not present

## 2023-03-24 DIAGNOSIS — T8450XA Infection and inflammatory reaction due to unspecified internal joint prosthesis, initial encounter: Secondary | ICD-10-CM

## 2023-03-24 DIAGNOSIS — Z96641 Presence of right artificial hip joint: Secondary | ICD-10-CM | POA: Diagnosis not present

## 2023-03-24 DIAGNOSIS — I482 Chronic atrial fibrillation, unspecified: Secondary | ICD-10-CM | POA: Diagnosis not present

## 2023-03-24 LAB — BPAM RBC
Blood Product Expiration Date: 202409102359
Blood Product Expiration Date: 202409262359
ISSUE DATE / TIME: 202409011454
ISSUE DATE / TIME: 202409031658
Unit Type and Rh: 600
Unit Type and Rh: 600

## 2023-03-24 LAB — HEMOGLOBIN AND HEMATOCRIT, BLOOD
HCT: 24.9 % — ABNORMAL LOW (ref 36.0–46.0)
HCT: 27.5 % — ABNORMAL LOW (ref 36.0–46.0)
Hemoglobin: 8.2 g/dL — ABNORMAL LOW (ref 12.0–15.0)
Hemoglobin: 8.8 g/dL — ABNORMAL LOW (ref 12.0–15.0)

## 2023-03-24 LAB — TYPE AND SCREEN
ABO/RH(D): A NEG
Antibody Screen: NEGATIVE
Unit division: 0
Unit division: 0

## 2023-03-24 LAB — CBC
HCT: 24.5 % — ABNORMAL LOW (ref 36.0–46.0)
Hemoglobin: 8.3 g/dL — ABNORMAL LOW (ref 12.0–15.0)
MCH: 30.7 pg (ref 26.0–34.0)
MCHC: 33.9 g/dL (ref 30.0–36.0)
MCV: 90.7 fL (ref 80.0–100.0)
Platelets: 244 10*3/uL (ref 150–400)
RBC: 2.7 MIL/uL — ABNORMAL LOW (ref 3.87–5.11)
RDW: 16.4 % — ABNORMAL HIGH (ref 11.5–15.5)
WBC: 11.1 10*3/uL — ABNORMAL HIGH (ref 4.0–10.5)
nRBC: 0 % (ref 0.0–0.2)

## 2023-03-24 MED ORDER — CHLORHEXIDINE GLUCONATE CLOTH 2 % EX PADS
6.0000 | MEDICATED_PAD | Freq: Every day | CUTANEOUS | Status: DC
  Administered 2023-03-24 – 2023-04-02 (×10): 6 via TOPICAL

## 2023-03-24 MED ORDER — SENNA 8.6 MG PO TABS
2.0000 | ORAL_TABLET | Freq: Two times a day (BID) | ORAL | Status: DC
  Administered 2023-03-24 – 2023-03-28 (×5): 17.2 mg via ORAL
  Filled 2023-03-24 (×10): qty 2

## 2023-03-24 MED ORDER — SODIUM CHLORIDE 0.9% FLUSH
10.0000 mL | Freq: Two times a day (BID) | INTRAVENOUS | Status: DC
  Administered 2023-03-24 – 2023-04-02 (×17): 10 mL

## 2023-03-24 MED ORDER — POLYETHYLENE GLYCOL 3350 17 G PO PACK
17.0000 g | PACK | Freq: Two times a day (BID) | ORAL | Status: DC
  Administered 2023-03-24 – 2023-03-28 (×5): 17 g via ORAL
  Filled 2023-03-24 (×8): qty 1

## 2023-03-24 MED ORDER — SODIUM CHLORIDE 0.9% FLUSH: 10.0000 mL | INTRAVENOUS | Status: DC | PRN

## 2023-03-24 NOTE — Progress Notes (Signed)
PHARMACY CONSULT NOTE FOR:  OUTPATIENT  PARENTERAL ANTIBIOTIC THERAPY (OPAT)  Indication: Serratia hip prosthetic joint infection  Regimen: Ceftriaxone 2 gm IV daily End date: 05/03/2023  Labs - Once weekly: CBC/D and BMP  Labs - Once weekly: ESR and CRP  Fax weekly labs to [phone]  Please pull PIC at completion of IV antibiotics  Method of administration: IV Push  Method of administration may be changed at the discretion of facility/pharmacy   IV antibiotic discharge orders are pended. To discharging provider:  please sign these orders via discharge navigator,  Select New Orders & click on the button choice - Manage This Unsigned Work.    Thank you for allowing pharmacy to be a part of this patient's care.  Littie Deeds, PharmD PGY1 Pharmacy Resident 03/24/2023, 1:22 PM

## 2023-03-24 NOTE — Progress Notes (Signed)
Peripherally Inserted Central Catheter Placement  The IV Nurse has discussed with the patient and/or persons authorized to consent for the patient, the purpose of this procedure and the potential benefits and risks involved with this procedure.  The benefits include less needle sticks, lab draws from the catheter, and the patient may be discharged home with the catheter. Risks include, but not limited to, infection, bleeding, blood clot (thrombus formation), and puncture of an artery; nerve damage and irregular heartbeat and possibility to perform a PICC exchange if needed/ordered by physician.  Alternatives to this procedure were also discussed.  Bard Power PICC patient education guide, fact sheet on infection prevention and patient information card has been provided to patient /or left at bedside.    PICC Placement Documentation  PICC Single Lumen 03/24/23 Right Basilic 39 cm 0 cm (Active)  Indication for Insertion or Continuance of Line Home intravenous therapies (PICC only) 03/24/23 2022  Exposed Catheter (cm) 0 cm 03/24/23 2022  Site Assessment Clean, Dry, Intact 03/24/23 2022  Line Status Blood return noted;Flushed;Saline locked 03/24/23 2022  Dressing Type Transparent;Securing device 03/24/23 2022  Dressing Status Antimicrobial disc in place;Clean, Dry, Intact 03/24/23 2022  Line Care Connections checked and tightened 03/24/23 2022  Line Adjustment (NICU/IV Team Only) Yes 03/24/23 2022  Dressing Intervention New dressing 03/24/23 2022  Dressing Change Due 03/31/23 03/24/23 2022       Christeen Douglas 03/24/2023, 8:40 PM

## 2023-03-24 NOTE — Progress Notes (Signed)
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 She agrees to session and is cooperative. Still only able to recall 1/3 precautions. Pt was able to advance ambulation from EOB to doorway and return.  Pt demonstrated much less antalgic gait without LOB or safety concerns. Pt is limited by fatigue more so than pain or strength. Acute PT will continue to follow per current POC.    If plan is discharge home, recommend the following: A lot of help with walking and/or transfers;A lot of help with bathing/dressing/bathroom;Assist for transportation;Help with stairs or ramp for entrance;Assistance with cooking/housework     Equipment Recommendations  Rolling walker (2 wheels);BSC/3in1       Precautions / Restrictions Precautions Precautions: Posterior Hip;Fall Precaution Booklet Issued: Yes (comment) Precaution Comments: 2 hemovac drains from R hip Restrictions Weight Bearing Restrictions: Yes RLE Weight Bearing: Weight bearing as tolerated LLE Weight Bearing: Weight bearing as tolerated Other Position/Activity Restrictions: RLE WBAT     Mobility  Bed Mobility Overal bed mobility: Needs Assistance Bed Mobility: Supine to Sit  Supine to sit: Min assist, HOB elevated, Used rails   Transfers Overall transfer level: Needs assistance Equipment used: Rolling walker (2 wheels) Transfers: Sit to/from  Stand Sit to Stand: Min assist  General transfer comment: pt was seated EOB upon arrival and at conclusion of PT session    Ambulation/Gait Ambulation/Gait assistance: Contact guard assist Gait Distance (Feet): 25 Feet Assistive device: Rolling walker (2 wheels) Gait Pattern/deviations: Step-through pattern, Decreased step length - right, Decreased step length - left, Decreased stride length Gait velocity: decreased  General Gait Details: less antalgic gait this afternoon versus AM session     Balance Overall balance assessment: Needs assistance Sitting-balance support: No upper extremity supported, Feet supported Sitting balance-Leahy Scale: Good    Standing balance support: Bilateral upper extremity supported, Reliant on assistive device for balance Standing balance-Leahy Scale: Fair       Cognition Arousal: Alert Behavior During Therapy: WFL for tasks assessed/performed Overall Cognitive Status: Within Functional Limits for tasks assessed    General Comments: Pt is A but only oriented x 2. she recalled 1/3 hip precautions        Exercises Other Exercises Other Exercises: edu pt and daugther ron ther ex to promote strengthening    General Comments General comments (skin integrity, edema, etc.): session limited by pt needing to use BR. Will return for PM session and continue to progress pt per current POC.      Pertinent Vitals/Pain Pain Assessment Pain Assessment: 0-10 Pain Score: 2  Faces Pain Scale: Hurts a little bit Negative Vocalization: none Pain Location: R hip Pain Descriptors / Indicators: Discomfort, Sore Pain Intervention(s): Limited activity within patient's tolerance, Monitored during session, Repositioned, Premedicated before session     PT Goals (current goals can now be found in the care plan section) Acute Rehab PT Goals Patient Stated Goal: to get stronger Progress  towards PT goals: Progressing toward goals    Frequency    BID        AM-PAC PT "6 Clicks" Mobility   Outcome Measure  Help needed turning from your back to your side while in a flat bed without using bedrails?: A Little Help needed moving from lying on your back to sitting on the side of a flat bed without using bedrails?: A Little Help needed moving to and from a bed to a chair (including a wheelchair)?: A Little Help needed standing up from a chair using your arms (e.g., wheelchair or bedside chair)?: A Lot Help needed to walk in hospital room?: A Lot Help needed climbing 3-5 steps with a railing? : A Lot 6 Click Score: 15    End of Session Equipment Utilized During Treatment: Gait belt Activity Tolerance: Patient tolerated treatment well Patient left: in chair;with call bell/phone within reach;with chair alarm set;with family/visitor present;Other (comment) Nurse Communication: Mobility status;Precautions;Weight bearing status PT Visit Diagnosis: Other abnormalities of gait and mobility (R26.89);Unsteadiness on feet (R26.81);Muscle weakness (generalized) (M62.81);Difficulty in walking, not elsewhere classified (R26.2);Pain Pain - Right/Left: Right Pain - part of body: Hip     Time: 1345-1401 PT Time Calculation (min) (ACUTE ONLY): 16 min  Charges:    $Gait Training: 8-22 mins $Therapeutic Activity: 8-22 mins PT General Charges $$ ACUTE PT VISIT: 1 Visit                    Jetta Lout PTA 03/24/23, 3:39 PM

## 2023-03-24 NOTE — TOC Progression Note (Signed)
Transition of Care [facility]) - Progression Note    Patient Details  Name: Brooke Davis MRN: [id number] Date of Birth: [birth year]  [facility] CM/SW Contact  Garret Reddish, RN Phone Number: 03/24/2023, 2:54 PM  Clinical Narrative:   Chart reviewed.  Noted that patient was admitted for Sepsis [facility]) secondary to right total hip arthroplasty infection.  Noted that patient is on IV ceftriaxone 2 gm.  Patient will require IV antibiotics for 6 weeks.  End date will be October 14 th 2024.    I have meet with patient and her daughters at bedside today.  I have reviewed bed offers. Patient and daughters have chosen [facility].  I have made Tiffany aware.  Once patient is medical stable will submit for SNF authorization.    TOC will continue to follow for discharge planning.           Expected Discharge Plan and Services                                               Social Determinants of Health (SDOH) Interventions SDOH Screenings   Food Insecurity: No Food Insecurity (03/19/2023)  Housing: Low Risk  (03/19/2023)  Transportation Needs: No Transportation Needs (03/19/2023)  Utilities: Not At Risk (03/19/2023)  Financial Resource Strain: Patient Declined (11/05/2022)   Received from [facility], [facility]  Tobacco Use: Medium Risk (03/18/2023)    Readmission Risk Interventions    03/01/2023   10:20 AM  Readmission Risk Prevention Plan  Transportation Screening Complete  PCP or Specialist Appt within 3-5 Days Complete  HRI or Home Care Consult Complete  Social Work Consult for Recovery Care Planning/Counseling Complete  Palliative Care Screening Not Applicable  Medication Review [employer]) Complete

## 2023-03-24 NOTE — Progress Notes (Signed)
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 Pt has poor recall of hip precautions when asked, however appropriate performance during tx session with intermittent vcs required for adherence. Tx session targeted task oriented training via ADL tasks and improving functional activity tolerance in preparation for toilet/shower transfers. STS completed with MIN A, SET UP required for grooming tasks, MIN A +2 with chair follow initially required for amb in room, progress to +1 with no chair, MOD A for sit>supine. Education provided to pt and daughters re: safe ADL completion within precautions, positioning. Nurse in room at end of session to address IV. Discharge remains appropriate. OT will continue to follow.       If plan is discharge home, recommend the following:  A lot of help with walking and/or transfers;A lot of help with bathing/dressing/bathroom;Help with stairs or ramp for entrance;Assistance with cooking/housework   Equipment Recommendations  None recommended by OT    Recommendations for Other Services      Precautions / Restrictions Precautions Precautions: Posterior Hip;Fall Precaution Booklet Issued: Yes (comment) Precaution Comments: 2 hemovac drains from R hip Restrictions Weight Bearing Restrictions: Yes RLE Weight Bearing: Weight bearing as  tolerated LLE Weight Bearing: Weight bearing as tolerated Other Position/Activity Restrictions: RLE WBAT       Mobility Bed Mobility Overal bed mobility: Needs Assistance Bed Mobility: Sit to Supine       Sit to supine: Mod assist, Used rails   General bed mobility comments: assist for BLE    Transfers Overall transfer level: Needs assistance Equipment used: Rolling walker (2 wheels) Transfers: Sit to/from Stand Sit to Stand: Min assist                 Balance                                           ADL either performed or assessed with clinical judgement   ADL Overall ADL's : Needs assistance/impaired Eating/Feeding: Set up;Sitting   Grooming: Wash/dry face;Sitting;Set up               Lower Body Dressing: Maximal assistance;Sit to/from stand   Toilet Transfer: Minimal assistance;Rolling walker (2 wheels) Toilet Transfer Details (indicate cue type and reason): simulated +1-2         Functional mobility during ADLs: Minimal assistance;Rolling walker (2 wheels) (approx 20' with +2 for chair follow initially, progressed to not requiring chair follow) General ADL Comments: intermittent vcs for adherence to hip precautions    Extremity/Trunk Assessment              Vision       Perception     Praxis      Cognition Arousal: Alert Behavior During Therapy: WFL for tasks assessed/performed Overall Cognitive Status: Impaired/Different  from baseline Area of Impairment: Safety/judgement, Awareness, Problem solving                         Safety/Judgement: Decreased awareness of deficits Awareness: Emergent Problem Solving: Slow processing          Exercises Other Exercises Other Exercises: Edu pt and daugthers re: ADL performance with hip precautions, optimal positioning    Shoulder Instructions       General Comments session limited by pt needing to use BR. Will return for PM session and continue to  progress pt per current POC.    Pertinent Vitals/ Pain       Pain Assessment Pain Assessment: 0-10 Pain Score: 3  Pain Location: R hip Pain Descriptors / Indicators: Discomfort, Sore Pain Intervention(s): Limited activity within patient's tolerance, Monitored during session, Premedicated before session, Repositioned  Home Living                                          Prior Functioning/Environment              Frequency  Min 1X/week        Progress Toward Goals  OT Goals(current goals can now be found in the care plan section)  Progress towards OT goals: Progressing toward goals     Plan      Co-evaluation    PT/OT/SLP Co-Evaluation/Treatment: Yes Reason for Co-Treatment: To address functional/ADL transfers;Complexity of the patient's impairments (multi-system involvement)   OT goals addressed during session: ADL's and self-care      AM-PAC OT "6 Clicks" Daily Activity     Outcome Measure   Help from another person eating meals?: None Help from another person taking care of personal grooming?: None Help from another person toileting, which includes using toliet, bedpan, or urinal?: A Lot Help from another person bathing (including washing, rinsing, drying)?: A Lot Help from another person to put on and taking off regular upper body clothing?: A Little Help from another person to put on and taking off regular lower body clothing?: A Lot 6 Click Score: 17    End of Session Equipment Utilized During Treatment: Rolling walker (2 wheels)  OT Visit Diagnosis: Other abnormalities of gait and mobility (R26.89)   Activity Tolerance Patient tolerated treatment well   Patient Left in bed;with call bell/phone within reach;with bed alarm set;with nursing/sitter in room   Nurse Communication Mobility status        Time: 0109-3235 OT Time Calculation (min): 25 min  Charges: OT General Charges $OT Visit: 1 Visit OT Treatments $Therapeutic  Activity: 8-22 mins Oleta Mouse, OTD OTR/L  03/24/23, 4:04 PM

## 2023-03-24 NOTE — Progress Notes (Signed)
1      PROGRESS NOTE    Brooke Davis  ZOX:[id number] DOB: 18-Oct-1940 DOA: 03/18/2023 PCP: [name], MD    Brief Narrative:   82 y.o. female with medical history significant for DM2, HTN, CAD s/p PCI and stent on Plavix, dCHF, chronic A fib on Eliquis, and chronic anxiety, hospitalized in July 2024 with an acute embolic stroke while on Eliquis, with workup showing left carotid artery stenosis s/p L carotid stent placement on 02/25/23 and undergoing total right hip replacement a couple weeks later on 03/09/23,  who presents to the ED with a 3-day history of fever, nausea, dizziness, diarrhea, R hip pain and swelling.    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  Status post right hip irrigation and debridement on 03/21/2023 by orthopedic surgery Dr. Clemencia Course.  Infectious disease consulted and following.  Joint fluid culture growing rare Serratia Marcences, IV antibiotics de-escalated from cefepime and IV vancomycin to Rocephin on 03/23/2023, under the guidance of infectious disease.  Further hospital course and management as outlined below.    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 Clemencia Course on 03/21/2023. Joint fluid culture growing rare Serratia Marcences, IV antibiotics de-escalated from cefepime and IV vancomycin to Rocephin on 03/23/2023, under the guidance of infectious disease. Continue as needed analgesics Appreciate infectious disease  assistance. 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 Appreciate orthopedic surgery's assistance.   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   Curb sided with vascular surgery, Dr. Wyn Quaker, recommended to restart Eliquis and Plavix within 48 hours of I&D done on 03/21/2023.   Chronic diastolic CHF (congestive heart failure) (HCC) Euvolemic on exam. Hold Entresto, metoprolol and torsemide due to soft BPs Continue strict I's and O's and daily weight   Atrial fibrillation, chronic (HCC) Chronic anticoagulation Currently rate controlled, on Eliquis for secondary CVA prevention. Home metoprolol on hold to avoid hypotension due to soft BPs   CAD S/P percutaneous coronary angioplasty Continue atorvastatin and Plavix   Anxiety and depression Continue citalopram and alprazolam   Primary hypertension Hold off home oral antihypertensives due to soft BPs Continue to closely monitor vital signs.      DVT prophylaxis:  apixaban (ELIQUIS) tablet 5 mg     Code Status: Currently DNR per the patient herself.  Short-term  intubation okay.  No CPR / chest compressions.  See code status order for partial code limitations.  Family Communication: daughter at bedside on rounds, updated  Disposition Plan: SNF    Consultants:  Orthopedics Curb  sided with vascular surgery, Dr. Wyn Quaker. Infectious disease.    Antimicrobials:  IV cefepime and vancomycin, stopped on 03/23/2023 Rocephin started on 03/23/2023.     Objective: Vitals:   03/23/23 1701 03/23/23 1717 03/23/23 2157 03/24/23 0815  BP: (!) 114/52 (!) 132/54 127/62 (!) 108/47  Pulse: 69 78 76 66  Resp: 14 18 16 16   Temp: (!) 97.4 F (36.3 C) 98 F (36.7 C) 98.2 F (36.8 C) 97.9 F (36.6 C)  TempSrc:  Oral Oral   SpO2: 100% 100% 100% 99%  Weight:      Height:        Intake/Output Summary (Last 24 hours) at 03/24/2023 1439 Last data filed at 03/24/2023 0400 Gross per 24 hour  Intake 272 ml  Output 450 ml  Net -178 ml   Filed Weights   03/19/23 0753 03/21/23 0500 03/23/23 0418  Weight: 83.8 kg 85.6 kg 92.9 kg    Examination:  General exam: awake, alert, no acute distress HEENT: moist mucus membranes, hard of hearing  Respiratory system: CTAB, no wheezes, rales or rhonchi, normal respiratory effort. Cardiovascular system: normal S1/S2, RRR, no JVD, murmurs, rubs, gallops, no pedal edema.   Gastrointestinal system: soft, NT, ND, no HSM felt, +bowel sounds. Central nervous system: A&O . no gross focal neurologic deficits, normal speech Extremities: moves all , no edema, normal tone Skin: dry, intact, normal temperature Psychiatry: normal mood, congruent affect     Data Reviewed: I have personally reviewed following labs and imaging studies  CBC: Recent Labs  Lab 03/18/23 2134 03/20/23 0605 03/21/23 0433 03/22/23 0519 03/23/23 0505 03/24/23 0034 03/24/23 0438 03/24/23 0842  WBC 9.8 7.8 8.4 9.3 15.9*  --  11.1*  --   NEUTROABS 8.8*  --   --   --   --   --   --   --   HGB 9.1* 8.2* 8.6* 9.2* 7.5* 8.2* 8.3* 8.8*  HCT 27.9* 25.6* 26.6* 28.0* 22.5* 24.9* 24.5* 27.5*  MCV 92.1 93.1 92.0 91.8 91.8  --  90.7  --   PLT 290 218 250 254 251  --  244  --    Basic Metabolic Panel: Recent Labs  Lab 03/18/23 2216 03/20/23 0605 03/21/23 0433 03/22/23 0519  03/23/23 0505  NA 134* 136 134* 136 135  K 3.3* 3.6 3.5 4.1 4.7  CL 96* 103 100 105 102  CO2 26 26 26 23 22   GLUCOSE 146* 155* 111* 212* 167*  BUN 18 17 17  25* 34*  CREATININE 1.27* 1.17* 1.17* 1.28* 1.43*  1.36*  CALCIUM 8.7* 8.3* 8.3* 8.4* 8.6*  MG  --  1.4*  --   --  1.4*  PHOS  --   --   --   --  3.2   GFR: Estimated Creatinine Clearance: 35 mL/min (A) (by C-G formula based on SCr of 1.36 mg/dL (H)). Liver Function Tests: Recent Labs  Lab 03/18/23 2216  AST 19  ALT 12  ALKPHOS 85  BILITOT 0.9  PROT 6.5  ALBUMIN 2.9*   No results for input(s): "LIPASE", "AMYLASE" in the last 168 hours. No results for input(s): "AMMONIA" in the last 168 hours. Coagulation Profile: Recent Labs  Lab 03/18/23 2216  INR 2.2*   Sepsis Labs: Recent Labs  Lab 03/18/23 2134  03/19/23 0142  PROCALCITON  --  0.46  LATICACIDVEN 1.7 1.2    Recent Results (from the past 240 hour(s))  Blood Culture (routine x 2)     Status: None   Collection Time: 03/18/23  9:34 PM   Specimen: BLOOD RIGHT HAND  Result Value Ref Range Status   Specimen Description BLOOD RIGHT HAND  Final   Special Requests   Final    BOTTLES DRAWN AEROBIC ONLY Blood Culture adequate volume   Culture   Final    NO GROWTH 5 DAYS Performed at [facility], [address] [place], [place] [address]    Report Status 03/23/2023 FINAL  Final  Blood Culture (routine x 2)     Status: None   Collection Time: 03/18/23  9:36 PM   Specimen: BLOOD LEFT HAND  Result Value Ref Range Status   Specimen Description BLOOD LEFT HAND  Final   Special Requests   Final    BOTTLES DRAWN AEROBIC AND ANAEROBIC Blood Culture adequate volume   Culture   Final    NO GROWTH 5 DAYS Performed at [facility], [address] [place], [place] [address]    Report Status 03/23/2023 FINAL  Final  Resp panel by RT-PCR (RSV, Flu A&B, Covid) Anterior Nasal Swab     Status: None   Collection Time: 03/18/23 10:28 PM   Specimen:  Anterior Nasal Swab  Result Value Ref Range Status   SARS Coronavirus 2 by RT PCR NEGATIVE NEGATIVE Final    Comment: (NOTE) SARS-CoV-2 target nucleic acids are NOT DETECTED.  The SARS-CoV-2 RNA is generally detectable in upper respiratory specimens during the acute phase of infection. The lowest concentration of SARS-CoV-2 viral copies this assay can detect is 138 copies/mL. A negative result does not preclude SARS-Cov-2 infection and should not be used as the sole basis for treatment or other patient management decisions. A negative result may occur with  improper specimen collection/handling, submission of specimen other than nasopharyngeal swab, presence of viral mutation(s) within the areas targeted by this assay, and inadequate number of viral copies(<138 copies/mL). A negative result must be combined with clinical observations, patient history, and epidemiological information. The expected result is Negative.  Fact Sheet for Patients:  BloggerCourse.com  Fact Sheet for Healthcare Providers:  SeriousBroker.it  This test is no t yet approved or cleared by the [place] FDA and  has been authorized for detection and/or diagnosis of SARS-CoV-2 by FDA under an Emergency Use Authorization (EUA). This EUA will remain  in effect (meaning this test can be used) for the duration of the COVID-19 declaration under Section 564(b)(1) of the Act, 21 U.S.C.section 360bbb-3(b)(1), unless the authorization is terminated  or revoked sooner.       Influenza A by PCR NEGATIVE NEGATIVE Final   Influenza B by PCR NEGATIVE NEGATIVE Final    Comment: (NOTE) The Xpert Xpress SARS-CoV-2/FLU/RSV plus assay is intended as an aid in the diagnosis of influenza from Nasopharyngeal swab specimens and should not be used as a sole basis for treatment. Nasal washings and aspirates are unacceptable for Xpert Xpress SARS-CoV-2/FLU/RSV testing.  Fact  Sheet for Patients: BloggerCourse.com  Fact Sheet for Healthcare Providers: SeriousBroker.it  This test is not yet approved or cleared by the [place] FDA and has been authorized for detection and/or diagnosis of SARS-CoV-2 by FDA under an Emergency Use Authorization (EUA). This EUA will remain in effect (meaning this test can be used) for the duration of the COVID-19 declaration  under Section 564(b)(1) of the Act, 21 U.S.C. section 360bbb-3(b)(1), unless the authorization is terminated or revoked.     Resp Syncytial Virus by PCR NEGATIVE NEGATIVE Final    Comment: (NOTE) Fact Sheet for Patients: BloggerCourse.com  Fact Sheet for Healthcare Providers: SeriousBroker.it  This test is not yet approved or cleared by the [place] FDA and has been authorized for detection and/or diagnosis of SARS-CoV-2 by FDA under an Emergency Use Authorization (EUA). This EUA will remain in effect (meaning this test can be used) for the duration of the COVID-19 declaration under Section 564(b)(1) of the Act, 21 U.S.C. section 360bbb-3(b)(1), unless the authorization is terminated or revoked.  Performed at [facility], [address] [place], [place] [address]   MRSA Next Gen by PCR, Nasal     Status: None   Collection Time: 03/19/23 11:02 AM   Specimen: Nasal Mucosa; Nasal Swab  Result Value Ref Range Status   MRSA by PCR Next Gen NOT DETECTED NOT DETECTED Final    Comment: (NOTE) The GeneXpert MRSA Assay (FDA approved for NASAL specimens only), is one component of a comprehensive MRSA colonization surveillance program. It is not intended to diagnose MRSA infection nor to guide or monitor treatment for MRSA infections. Test performance is not FDA approved in patients less than 6 years old. Performed at [facility], [address] [place], [place] [address]    Aerobic Culture w Gram Stain (superficial specimen)     Status: None   Collection Time: 03/21/23 12:00 PM   Specimen: Wound  Result Value Ref Range Status   Specimen Description   Final    WOUND Performed at [facility], [address], [place], [place] [address]    Special Requests   Final    NONE Performed at [facility], [address] [place], [place] [address]    Gram Stain   Final    MODERATE WBC PRESENT,BOTH PMN AND MONONUCLEAR NO ORGANISMS SEEN    Culture   Final    RARE SERRATIA MARCESCENS SUSCEPTIBILITIES PERFORMED ON PREVIOUS CULTURE WITHIN THE LAST 5 DAYS. Performed at [facility], [address], [place], [place] [address]    Report Status 03/23/2023 FINAL  Final  Aerobic/Anaerobic Culture w Gram Stain (surgical/deep wound)     Status: None (Preliminary result)   Collection Time: 03/21/23 12:01 PM   Specimen: Wound; Body Fluid  Result Value Ref Range Status   Specimen Description   Final    WOUND Performed at [facility], [address], [place], [place] [address]    Special Requests   Final    NONE Performed at [facility], [address] [place], [place] [address]    Gram Stain   Final    FEW WBC PRESENT,BOTH PMN AND MONONUCLEAR NO ORGANISMS SEEN Performed at [facility], [address], [place], [place] [address]    Culture   Final    RARE SERRATIA MARCESCENS SUSCEPTIBILITIES TO FOLLOW NO ANAEROBES ISOLATED; CULTURE IN PROGRESS FOR 5 DAYS    Report Status PENDING  Incomplete  Aerobic Culture w Gram Stain (superficial specimen)     Status: None   Collection Time: 03/21/23 12:01 PM   Specimen: Wound  Result Value Ref Range Status   Specimen Description   Final    WOUND Performed at [facility], [address], [place], [place] [address]    Special Requests   Final    NONE Performed  at [facility], [address] [place], [place] [address]    Gram Stain   Final     FEW WBC PRESENT,BOTH PMN AND MONONUCLEAR NO ORGANISMS SEEN Performed at [facility], [address], [place], [place] [address]    Culture RARE SERRATIA MARCESCENS  Final   Report Status 03/23/2023 FINAL  Final   Organism ID, Bacteria SERRATIA MARCESCENS  Final      Susceptibility   Serratia marcescens - MIC*    CEFEPIME <=0.12 SENSITIVE Sensitive     CEFTAZIDIME <=1 SENSITIVE Sensitive     CEFTRIAXONE <=0.25 SENSITIVE Sensitive     CIPROFLOXACIN <=0.25 SENSITIVE Sensitive     GENTAMICIN <=1 SENSITIVE Sensitive     TRIMETH/SULFA <=20 SENSITIVE Sensitive     * RARE SERRATIA MARCESCENS         Radiology Studies: No results found.      Scheduled Meds:  allopurinol  300 mg Oral Daily   apixaban  5 mg Oral BID   atorvastatin  80 mg Oral Daily   citalopram  20 mg Oral Daily   clopidogrel  75 mg Oral Daily   metoprolol tartrate  12.5 mg Oral BID   polyethylene glycol  17 g Oral BID   senna  2 tablet Oral BID   Continuous Infusions:  cefTRIAXone (ROCEPHIN)  IV 2 g (03/23/23 2200)     LOS: 6 days    Time spent: 42 minutes    Pennie Banter, DO [facility] Pager [phone]  If 7PM-7AM, please contact night-coverage [email or web address]  03/24/2023, 2:39 PM

## 2023-03-24 NOTE — Progress Notes (Signed)
Physical Therapy Treatment Patient Details Name: Brooke Davis MRN: [id number] DOB: 30-Sep-1940 Today's Date: 03/24/2023   History of Present Illness Pt is an 82 year old female s/p washout of R thigh collection with washout of total hip arthroplasty with poly exchange and head exchange; PMH significant for DM2, HTN, CAD s/p PCI and stent, dCHF A fib on Eliquis, hospitalized in July 2024 for elective hip replacement, again hospitalized in July 2024 with an acute embolic stroke while on Eliquis, with workup showing left carotid artery stenosis, s/p carotid stent placement on 02/25/23    PT Comments  Pt was long sitting in bed upon arriving. She is alert and oriented x 3 but HOH and could not remember but 1 of 3 hip precautions. Pt does follow commands is was agreeable to OOB activity. She was able to Roll R to short sit. Ambulated ~12 ft prior to needing to  use BR. Pt was issued HEP handout and Thereasa Parkin educated family on correct performance. Will return this afternoon for PM session. DC recs remain appropriate.    If plan is discharge home, recommend the following: A lot of help with walking and/or transfers;A lot of help with bathing/dressing/bathroom;Assist for transportation;Help with stairs or ramp for entrance;Assistance with cooking/housework     Equipment Recommendations  Rolling walker (2 wheels);BSC/3in1       Precautions / Restrictions Precautions Precautions: Posterior Hip;Fall Precaution Booklet Issued: Yes (comment) Precaution Comments: 2 hemovac drains from R hip Restrictions Weight Bearing Restrictions: Yes RLE Weight Bearing: Weight bearing as tolerated LLE Weight Bearing: Weight bearing as tolerated Other Position/Activity Restrictions: RLE WBAT     Mobility  Bed Mobility Overal bed mobility: Needs Assistance Bed Mobility: Supine to Sit  Supine to sit: Min assist, HOB elevated, Used rails   Transfers Overall transfer level: Needs assistance Equipment used: Rolling  walker (2 wheels) Transfers: Sit to/from Stand Sit to Stand: Min assist   Ambulation/Gait Ambulation/Gait assistance: Contact guard assist Gait Distance (Feet): 12 Feet Assistive device: Rolling walker (2 wheels) Gait Pattern/deviations: Step-through pattern, Decreased step length - right, Decreased step length - left, Decreased stride length Gait velocity: decreased  General Gait Details: antalgic; decreased stance time R LE; vc's for technique and walker use    Balance Overall balance assessment: Needs assistance Sitting-balance support: No upper extremity supported, Feet supported Sitting balance-Leahy Scale: Good  Standing balance support: Bilateral upper extremity supported, Reliant on assistive device for balance Standing balance-Leahy Scale: Fair     Cognition Arousal: Alert Behavior During Therapy: WFL for tasks assessed/performed Overall Cognitive Status: Within Functional Limits for tasks assessed   General Comments: Pt is A but only oriented x 2. she recalled 1/3 hip precautions       General Comments General comments (skin integrity, edema, etc.): session limited by pt needing to use BR. Will return for PM session and continue to progress pt per current POC.      Pertinent Vitals/Pain Pain Assessment Pain Assessment: 0-10 Pain Score: 2  Faces Pain Scale: Hurts a little bit Negative Vocalization: none Pain Location: R hip Pain Descriptors / Indicators: Discomfort, Sore Pain Intervention(s): Limited activity within patient's tolerance, Monitored during session, Premedicated before session, Repositioned     PT Goals (current goals can now be found in the care plan section) Acute Rehab PT Goals Patient Stated Goal: to get stronger Progress towards PT goals: Progressing toward goals    Frequency    BID       AM-PAC PT "6 Clicks" Mobility  Outcome Measure  Help needed turning from your back to your side while in a flat bed without using bedrails?: A  Little Help needed moving from lying on your back to sitting on the side of a flat bed without using bedrails?: A Little Help needed moving to and from a bed to a chair (including a wheelchair)?: A Little Help needed standing up from a chair using your arms (e.g., wheelchair or bedside chair)?: A Lot Help needed to walk in hospital room?: A Lot Help needed climbing 3-5 steps with a railing? : A Lot 6 Click Score: 15    End of Session Equipment Utilized During Treatment: Gait belt Activity Tolerance: Patient tolerated treatment well Patient left: in chair;with call bell/phone within reach;with chair alarm set;with family/visitor present;Other (comment) Nurse Communication: Mobility status;Precautions;Weight bearing status PT Visit Diagnosis: Other abnormalities of gait and mobility (R26.89);Unsteadiness on feet (R26.81);Muscle weakness (generalized) (M62.81);Difficulty in walking, not elsewhere classified (R26.2);Pain Pain - Right/Left: Right Pain - part of body: Hip     Time: 1610-9604 PT Time Calculation (min) (ACUTE ONLY): 22 min  Charges:    $Therapeutic Activity: 8-22 mins PT General Charges $$ ACUTE PT VISIT: 1 Visit                    Jetta Lout PTA 03/24/23, 3:30 PM

## 2023-03-24 NOTE — Plan of Care (Signed)

## 2023-03-24 NOTE — Progress Notes (Signed)
Subjective: 3 Days Post-Op Procedure(s) (LRB): RIGHT HIP IRRIGATION AND DEBRIDEMENT OF INFECTED TOTAL JOINT, EXCHANGE OF HEAD AND POLY COMPONENTS WITH PLACEMENT OF ANTIBIOTIC BEADS (Right) Patient reports pain as  mild to minimal while not moving.  States that it does increase, especially while trying to get up with PT .   Patient is well, and has had no acute complaints or problems.  Denies any chest pain, shortness of breath, nausea, vomiting, fevers or chills.  States that she has not been able to move her bowels yet.  Continue to monitor this. We will continue with therapy today.  She has done well so far with PT able to get out of bed and ambulate short distances with moderately increased pain. Plan is to go Rehab after hospital stay.  Objective: Vital signs in last 24 hours: Temp:  [97.4 F (36.3 C)-98.2 F (36.8 C)] 97.9 F (36.6 C) (09/04 0815) Pulse Rate:  [66-78] 66 (09/04 0815) Resp:  [14-18] 16 (09/04 0815) BP: (108-132)/(47-62) 108/47 (09/04 0815) SpO2:  [99 %-100 %] 99 % (09/04 0815)  Intake/Output from previous day:  Intake/Output Summary (Last 24 hours) at 03/24/2023 0839 Last data filed at 03/24/2023 0400 Gross per 24 hour  Intake 272 ml  Output 950 ml  Net -678 ml    Intake/Output this shift: No intake/output data recorded.  Labs: Recent Labs    03/22/23 0519 03/23/23 0505 03/24/23 0034 03/24/23 0438  HGB 9.2* 7.5* 8.2* 8.3*   Recent Labs    03/23/23 0505 03/24/23 0034 03/24/23 0438  WBC 15.9*  --  11.1*  RBC 2.45*  --  2.70*  HCT 22.5* 24.9* 24.5*  PLT 251  --  244   Recent Labs    03/22/23 0519 03/23/23 0505  NA 136 135  K 4.1 4.7  CL 105 102  CO2 23 22  BUN 25* 34*  CREATININE 1.28* 1.43*  1.36*  GLUCOSE 212* 167*  CALCIUM 8.4* 8.6*   No results for input(s): "LABPT", "INR" in the last 72 hours.  EXAM General - Patient is Alert, Appropriate, and Oriented.  Patient is hard of hearing, hearing aids Extremity - Neurologically  intact Neurovascular intact Sensation intact distally Intact pulses distally Dorsiflexion/Plantar flexion intact No cellulitis present Compartment soft Dressing - scant drainage appreciated over the middle third of the Aquacel bandage Motor Function - intact, moving foot and toes well on exam.  Patient able to plantar and dorsiflex with good strength and range of motion.  Patient has some difficulty performing SLR, but is able to 2 with mild assist from provider, does report some increased pain.  Patient is neurovascularly intact down her right lower extremity to all dermatomes, posterior tibial pulses appreciated 2+. Deep and superficial JP Drain remain in place given patient's increased output, notably on the deep drain.  Will look to reassess/remove later today or tomorrow.  Past Medical History:  Diagnosis Date   Anxiety    a.) on BZO (alprazolam) PRN   Arthritis    fingers, right hip   Atrial fibrillation (HCC) 11/10/2013   a.) CHA2DS2VASc = 5 (age x2, sex, HTN, T2DM);  b.) rate/rhythm maintained on oral metoprolol tartrate; chronically anticoagulated with apixaban   Bronchitis    Cerebrovascular small vessel disease (chronic)    Coronary artery disease involving native coronary artery of native heart 01/11/2021   a.) LHC 01/11/2021: 40% oLM, 50% mLCx, 30% oLCx, 30% pLAD, 30% mLAD, 99% pRCA (2.75 x 30 mm Resolute Onyx DES), 75% p-mRCA, 60% dRCA  DDD (degenerative disc disease), lumbar 12/01/2013   Diastolic dysfunction 01/12/2021   a.) TTE 01/12/2021 (s/p inf STEMI): EF 55-60%, mild-mod LAE, mild RAE, mod RVE, mild AR/MR, mod TR, G1DD   Diet-controlled type 2 diabetes mellitus (HCC)    Full dentures    Gout    History of bilateral cataract extraction 2021   Hyperlipidemia    Hypertension, essential    Long term current use of anticoagulant    a.) apixaban   PONV (postoperative nausea and vomiting)    Sinoatrial node dysfunction (HCC) 11/10/2013   ST elevation myocardial  infarction (STEMI) of inferior wall (HCC) 01/11/2021   a.) LHC/PCI 01/11/2021 --> culprit lesion 99% pRCA --> 2.75 x 30 mm Resolute Onyx DES x 1   Varicose veins of legs    Wears hearing aid in both ears     Assessment/Plan: 3 Days Post-Op Procedure(s) (LRB): RIGHT HIP IRRIGATION AND DEBRIDEMENT OF INFECTED TOTAL JOINT, EXCHANGE OF HEAD AND POLY COMPONENTS WITH PLACEMENT OF ANTIBIOTIC BEADS (Right) Principal Problem:   Sepsis (HCC) Active Problems:   Primary hypertension   Hx of total hip arthroplasty, 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   Weight as of this encounter: 92.9 kg.  Her white cell count and hemoglobin have improved from yesterday.  White cell count now at 11.1 from 15.9.  Hemoglobin has improved from 7.5 to 8.3 today.  Will continue to trend these values.  May consider a transfusion if hemoglobin drops below 8.  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  Currently is being treated with cefepime.  Plan to have a PICC line placed later today.  Patient will continue to work with physical therapy to work on postoperative PT ambulation, gait, ROM and strengthening of her right lower extremity.  Hip Preacutions discussed  Discussed with the patient continuing to utilize ice over the bandage  Vascular and hospitalist team has suggested the patient be placed on Plavix/Eliquis, continue on this regimen for DVT prophylaxis.  JP drains still in place, will look to remove later today or tomorrow depending on output  Weight-Bearing as  tolerated to right leg  Patient will follow-up with [facility] clinic orthopedics in 2 weeks for reevaluation   Rayburn Go, PA-C [facility] 03/24/2023, 8:39 AM

## 2023-03-24 NOTE — Progress Notes (Signed)
Returned to visit with patient who was up in chair. States had worked well with PT this morning. Both deep and superficial drains had slowed on output even after being up to work with PT. Drains were removed without difficulty, intact. Sterile gauze and Tegaderm applied to cover drain site. A new Aquacel dressing was applied. The incision upon visualization was clean with scant bloody drainage along the middle aspect. The incision was cleaned using sterile gauze and alcohol wipes then redressed with the new Aquacel. Patients daughters present. Clarified concerns and questions regarding surgery and follow up. Patient and daughters understand. States she wishes to get back in bed as her back is starting to hurt. Discussed this with nursing. Will plan to follow up tomorrow for further progress evaluation.  Danise Edge, PA-C [facility]

## 2023-03-24 NOTE — Progress Notes (Signed)
[facility]    Date of Admission:  03/18/2023   Total days of antibiotics 7          ID: Brooke Davis is a 82 y.o. 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 Sleeping this afternoon due to being fatigued. Drains from right hip have been removed  Medications:   allopurinol  300 mg Oral Daily   apixaban  5 mg Oral BID   atorvastatin  80 mg Oral Daily   citalopram  20 mg Oral Daily   clopidogrel  75 mg Oral Daily   metoprolol tartrate  12.5 mg Oral BID   polyethylene glycol  17 g Oral BID   senna  2 tablet Oral BID    Objective: Vital signs in last 24 hours: Temp:  [97.4 F (36.3 C)-98.2 F (36.8 C)] 97.6 F (36.4 C) (09/04 1521) Pulse Rate:  [66-78] 72 (09/04 1521) Resp:  [14-18] 16 (09/04 1521) BP: (108-132)/(47-62) 125/52 (09/04 1521) SpO2:  [95 %-100 %] 95 % (09/04 1521)  Physical Exam  Constitutional:  oriented to person, place, and time. appears well-developed and well-nourished. No distress.  HENT: [place]/AT, PERRLA, no scleral icterus Mouth/Throat: Oropharynx is clear and moist. No oropharyngeal exudate.  Cardiovascular: Normal rate, regular rhythm and normal heart sounds. Exam reveals no gallop and no friction rub.  No murmur heard.  Pulmonary/Chest: Effort normal and breath sounds normal. No respiratory distress.  has no wheezes.  Neck = supple, no nuchal rigidity Abdominal: Soft. Bowel sounds are normal.  exhibits no distension. There is no tenderness.   Lymphadenopathy: no cervical adenopathy. No axillary adenopathy Neurological: alert and oriented to person, place, and time.  Skin: Skin is warm and dry. No rash noted. No erythema.  Psychiatric: a normal mood and affect.  behavior is normal.    Lab Results Recent Labs    03/22/23 0519 03/23/23 0505 03/24/23 0034 03/24/23 0438 03/24/23 0842  WBC 9.3 15.9*  --  11.1*  --   HGB 9.2* 7.5*   < > 8.3* 8.8*  HCT 28.0* 22.5*   < > 24.5* 27.5*  NA 136 135  --   --   --   K 4.1 4.7  --   --   --   CL 105 102  --   --   --   CO2 23 22  --   --   --   BUN 25* 34*  --   --   --   CREATININE 1.28* 1.43*  1.36*  --   --   --    < > = values in this interval not displayed.   Lab Results  Component Value Date   ESRSEDRATE 46 (H) 03/21/2023    Microbiology: 9/1 serratia Studies/Results: [place] EKG SITE RITE  Result Date: 03/24/2023 If Site Rite image not attached, placement could not be confirmed due to current cardiac rhythm.    Assessment/Plan: Serratia right hip pji = plan for 6 wk with ceftriaxone  followed by oral abtx for at least 3 months. Opat orders on note from 9/3  Will get picc line today.   Ckd = recommend recheck bmp tomorrow  [facility] Pager: [phone]  03/24/2023, 3:55 PM

## 2023-03-25 DIAGNOSIS — A498 Other bacterial infections of unspecified site: Secondary | ICD-10-CM | POA: Diagnosis not present

## 2023-03-25 DIAGNOSIS — I5032 Chronic diastolic (congestive) heart failure: Secondary | ICD-10-CM | POA: Diagnosis not present

## 2023-03-25 DIAGNOSIS — T8450XA Infection and inflammatory reaction due to unspecified internal joint prosthesis, initial encounter: Secondary | ICD-10-CM | POA: Diagnosis not present

## 2023-03-25 DIAGNOSIS — I482 Chronic atrial fibrillation, unspecified: Secondary | ICD-10-CM | POA: Diagnosis not present

## 2023-03-25 DIAGNOSIS — Z96641 Presence of right artificial hip joint: Secondary | ICD-10-CM | POA: Diagnosis not present

## 2023-03-25 DIAGNOSIS — T8451XA Infection and inflammatory reaction due to internal right hip prosthesis, initial encounter: Secondary | ICD-10-CM | POA: Diagnosis not present

## 2023-03-25 LAB — BASIC METABOLIC PANEL
Anion gap: 7 (ref 5–15)
BUN: 34 mg/dL — ABNORMAL HIGH (ref 8–23)
CO2: 25 mmol/L (ref 22–32)
Calcium: 8.9 mg/dL (ref 8.9–10.3)
Chloride: 105 mmol/L (ref 98–111)
Creatinine, Ser: 1.16 mg/dL — ABNORMAL HIGH (ref 0.44–1.00)
GFR, Estimated: 48 mL/min — ABNORMAL LOW (ref >60)
Glucose, Bld: 125 mg/dL — ABNORMAL HIGH (ref 70–99)
Potassium: 4.2 mmol/L (ref 3.5–5.1)
Sodium: 137 mmol/L (ref 135–145)

## 2023-03-25 LAB — CBC
HCT: 25.3 % — ABNORMAL LOW (ref 36.0–46.0)
Hemoglobin: 8.2 g/dL — ABNORMAL LOW (ref 12.0–15.0)
MCH: 29.7 pg (ref 26.0–34.0)
MCHC: 32.4 g/dL (ref 30.0–36.0)
MCV: 91.7 fL (ref 80.0–100.0)
Platelets: 285 10*3/uL (ref 150–400)
RBC: 2.76 MIL/uL — ABNORMAL LOW (ref 3.87–5.11)
RDW: 16.3 % — ABNORMAL HIGH (ref 11.5–15.5)
WBC: 9.5 10*3/uL (ref 4.0–10.5)
nRBC: 0.2 % (ref 0.0–0.2)

## 2023-03-25 LAB — MAGNESIUM: Magnesium: 1.4 mg/dL — ABNORMAL LOW (ref 1.7–2.4)

## 2023-03-25 MED ORDER — ENSURE ENLIVE PO LIQD
237.0000 mL | Freq: Three times a day (TID) | ORAL | Status: DC
  Administered 2023-03-26 – 2023-04-02 (×15): 237 mL via ORAL

## 2023-03-25 MED ORDER — MAGNESIUM SULFATE 4 GM/100ML IV SOLN
4.0000 g | Freq: Once | INTRAVENOUS | Status: AC
  Administered 2023-03-25: 4 g via INTRAVENOUS
  Filled 2023-03-25: qty 100

## 2023-03-25 MED ORDER — ADULT MULTIVITAMIN W/MINERALS CH
1.0000 | ORAL_TABLET | Freq: Every day | ORAL | Status: DC
  Administered 2023-03-25 – 2023-04-02 (×9): 1 via ORAL
  Filled 2023-03-25 (×9): qty 1

## 2023-03-25 MED ORDER — TORSEMIDE 20 MG PO TABS
20.0000 mg | ORAL_TABLET | Freq: Every day | ORAL | Status: DC
  Administered 2023-03-25 – 2023-04-02 (×9): 20 mg via ORAL
  Filled 2023-03-25 (×9): qty 1

## 2023-03-25 NOTE — Progress Notes (Signed)
Subjective: 4 Days Post-Op Procedure(s) (LRB): RIGHT HIP IRRIGATION AND DEBRIDEMENT OF INFECTED TOTAL JOINT, EXCHANGE OF HEAD AND POLY COMPONENTS WITH PLACEMENT OF ANTIBIOTIC BEADS (Right) Patient reports pain as  mild to minimal while not moving.  States it is uncomfortable with PT, but improving .   Patient is well, and has had no acute complaints or problems.  Denies any chest pain, shortness of breath, nausea, vomiting, fevers or chills.  States that she has not been able to move her bowels yet.  Continue to monitor this. We will continue with therapy today.  She has done well so far with PT able to get out of bed and ambulate short distances, seems to be improving day to day. Plan is to go Rehab after hospital stay.  Objective: Vital signs in last 24 hours: Temp:  [97.6 F (36.4 C)-98 F (36.7 C)] 98 F (36.7 C) (09/04 2217) Pulse Rate:  [66-77] 71 (09/04 2217) Resp:  [16-18] 18 (09/04 2217) BP: (108-134)/(47-58) 133/58 (09/04 2217) SpO2:  [95 %-99 %] 98 % (09/04 2217) Weight:  [92.7 kg] 92.7 kg (09/05 0456)  Intake/Output from previous day:  Intake/Output Summary (Last 24 hours) at 03/25/2023 0744 Last data filed at 03/24/2023 1642 Gross per 24 hour  Intake 220 ml  Output --  Net 220 ml    Intake/Output this shift: No intake/output data recorded.  Labs: Recent Labs    03/23/23 0505 03/24/23 0034 03/24/23 0438 03/24/23 0842 03/25/23 0437  HGB 7.5* 8.2* 8.3* 8.8* 8.2*   Recent Labs    03/24/23 0438 03/24/23 0842 03/25/23 0437  WBC 11.1*  --  9.5  RBC 2.70*  --  2.76*  HCT 24.5* 27.5* 25.3*  PLT 244  --  285   Recent Labs    03/23/23 0505 03/25/23 0437  NA 135 137  K 4.7 4.2  CL 102 105  CO2 22 25  BUN 34* 34*  CREATININE 1.43*  1.36* 1.16*  GLUCOSE 167* 125*  CALCIUM 8.6* 8.9   No results for input(s): "LABPT", "INR" in the last 72 hours.  EXAM General - Patient is Alert, Appropriate, and Oriented.  Patient is hard of hearing, hearing  aids Extremity - Neurologically intact Neurovascular intact Sensation intact distally Intact pulses distally Dorsiflexion/Plantar flexion intact No cellulitis present Compartment soft Dressing - scant drainage appreciated over the middle third of the Aquacel bandage Motor Function - intact, moving foot and toes well on exam.  Patient able to plantar and dorsiflex with good strength and range of motion.  Patient has some difficulty performing SLR, but is able to 2 with mild assist from provider, does report some increased pain.  Patient is neurovascularly intact down her right lower extremity to all dermatomes, posterior tibial pulses appreciated 2+. Deep and superficial JP Drain site dry without any drainage appreciated over bandages.  Past Medical History:  Diagnosis Date   Anxiety    a.) on BZO (alprazolam) PRN   Arthritis    fingers, right hip   Atrial fibrillation (HCC) 11/10/2013   a.) CHA2DS2VASc = 5 (age x2, sex, HTN, T2DM);  b.) rate/rhythm maintained on oral metoprolol tartrate; chronically anticoagulated with apixaban   Bronchitis    Cerebrovascular small vessel disease (chronic)    Coronary artery disease involving native coronary artery of native heart 01/11/2021   a.) LHC 01/11/2021: 40% oLM, 50% mLCx, 30% oLCx, 30% pLAD, 30% mLAD, 99% pRCA (2.75 x 30 mm Resolute Onyx DES), 75% p-mRCA, 60% dRCA   DDD (degenerative disc  disease), lumbar 12/01/2013   Diastolic dysfunction 01/12/2021   a.) TTE 01/12/2021 (s/p inf STEMI): EF 55-60%, mild-mod LAE, mild RAE, mod RVE, mild AR/MR, mod TR, G1DD   Diet-controlled type 2 diabetes mellitus (HCC)    Full dentures    Gout    History of bilateral cataract extraction 2021   Hyperlipidemia    Hypertension, essential    Long term current use of anticoagulant    a.) apixaban   PONV (postoperative nausea and vomiting)    Sinoatrial node dysfunction (HCC) 11/10/2013   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)  01/11/2021   a.) LHC/PCI 01/11/2021 --> culprit lesion 99% pRCA --> 2.75 x 30 mm Resolute Onyx DES x 1   Varicose veins of legs    Wears hearing aid in both ears     Assessment/Plan: 4 Days Post-Op Procedure(s) (LRB): RIGHT HIP IRRIGATION AND DEBRIDEMENT OF INFECTED TOTAL JOINT, EXCHANGE OF HEAD AND POLY COMPONENTS WITH PLACEMENT OF ANTIBIOTIC BEADS (Right) Principal Problem:   Sepsis (HCC) Active Problems:   Primary hypertension   Hx of total hip arthroplasty, 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   Weight as of this encounter: 92.7 kg.  Her white cell count and hemoglobin have improved from yesterday.  White cell count now at 9.5 from 11.1.  Hemoglobin has dropped from 8.8 to 8.2 today.  Will continue to trend these values.  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  Switched to ceftriaxone.  PICC in place, plan for 6 weeks abx per ID then transition to oral abx for at least 3 months.  Patient will continue to work with physical therapy to work on postoperative PT ambulation, gait, ROM and strengthening of her right lower extremity.  Hip Preacutions discussed  Discussed with the patient continuing to utilize ice over the bandage  Vascular and hospitalist team has suggested the patient be placed on Plavix/Eliquis, continue on this regimen for DVT prophylaxis.  JP drains removed, sites remain clean  Weight-Bearing as tolerated to right leg  Patient will follow-up with [facility] clinic orthopedics in 2  weeks for reevaluation   Rayburn Go, PA-C [facility] 03/25/2023, 7:44 AM

## 2023-03-25 NOTE — Care Management Important Message (Signed)
Important Message  Patient Details  Name: Brooke Davis MRN: [id number] Date of Birth: Sep 14, 1940   Medicare Important Message Given:  Yes     Brooke Davis 03/25/2023, 12:53 PM

## 2023-03-25 NOTE — Progress Notes (Signed)
[facility]    Date of Admission:  03/18/2023     ID: Brooke Davis is a 82 y.o. 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 Afebrile. Sitting up in chair  Medications:   allopurinol  300 mg Oral Daily   apixaban  5 mg Oral BID   atorvastatin  80 mg Oral Daily   Chlorhexidine Gluconate Cloth  6 each Topical Daily   citalopram  20 mg Oral Daily   clopidogrel  75 mg Oral Daily   feeding supplement  237 mL Oral TID BM   metoprolol tartrate  12.5 mg Oral BID   multivitamin with minerals  1 tablet Oral Daily   polyethylene glycol  17 g Oral BID   senna  2 tablet Oral BID   sodium chloride flush  10-40 mL Intracatheter Q12H   torsemide  20 mg Oral Daily    Objective: Vital signs in last 24 hours: Temp:  [98 F (36.7 C)-98.3 F (36.8 C)] 98.3 F (36.8 C) (09/05 1513) Pulse Rate:  [71-77] 72 (09/05 1513) Resp:  [16-18] 16 (09/05 1513) BP: (133-141)/(52-61) 140/53 (09/05 1513) SpO2:  [97 %-100 %] 100 % (09/05 1513) Weight:  [92.7 kg] 92.7 kg (09/05 0456)  Physical Exam  Constitutional:  oriented to person, place, and time. appears well-developed and well-nourished. No distress.  HENT: [place]/AT, PERRLA, no scleral icterus Mouth/Throat: Oropharynx is clear and moist. No oropharyngeal exudate.  Cardiovascular: Normal rate, regular rhythm and normal heart sounds. Exam reveals no gallop and no friction rub.  No murmur heard.   Pulmonary/Chest: Effort normal and breath sounds normal. No respiratory distress.  has no wheezes.  Neck = supple, no nuchal rigidity WNU:UVOZD arm picc line Abdominal: Soft. Bowel sounds are normal.  exhibits no distension. There is no tenderness.  Lymphadenopathy: no cervical adenopathy. No axillary adenopathy Neurological: alert and oriented to person, place, and time.  Skin: Skin is warm and dry. No rash noted. No erythema.  Psychiatric: a normal mood and affect.  behavior is normal.    Lab Results Recent Labs    03/23/23 0505 03/24/23 0034 03/24/23 0438 03/24/23 0842 03/25/23 0437  WBC 15.9*  --  11.1*  --  9.5  HGB 7.5*   < > 8.3* 8.8* 8.2*  HCT 22.5*   < > 24.5* 27.5* 25.3*  NA 135  --   --   --  137  K 4.7  --   --   --  4.2  CL 102  --   --   --  105  CO2 22  --   --   --  25  BUN 34*  --   --   --  34*  CREATININE 1.43*  1.36*  --   --   --  1.16*   < > = values in this interval not displayed.   Lab Results  Component Value Date   ESRSEDRATE 63 (H) 03/21/2023  Lab Results  Component Value Date   CRP 10.5 (H) 03/21/2023    Microbiology: 9/1 Serratia marcescens      MIC    CEFEPIME <=0.12 SENS... Sensitive    CEFTAZIDIME <=1 SENSITIVE Sensitive    CEFTRIAXONE <=0.25 SENS... Sensitive    CIPROFLOXACIN <=0.25 SENS... Sensitive    GENTAMICIN <=1 SENSITIVE Sensitive    TRIMETH/SULFA <=20 SENSIT... Sensitive    Studies/Results: [place] EKG SITE RITE  Result Date: 03/24/2023 If Site Rite image not attached, placement could not be confirmed due to current cardiac rhythm.    Assessment/Plan: Serratia right hip pji s/p I x D, exchange of head and poly components with abtx beads on 9/1  - plan for 6 wk of iv serratia and then transition to oral suppression with bactrim for 3 months ---------------------------------------------------------- Diagnosis: Right hip pji  Culture Result: serratia  Allergies  Allergen Reactions   Celebrex [Celecoxib] Diarrhea    Penicillins Itching    IgE = 154 (WNL) on 01/05/2023   Relafen [Nabumetone]     Palpitation    Lovastatin Diarrhea    OPAT Orders Discharge antibiotics to be given via PICC line Discharge antibiotics: Per pharmacy protocol ceftriaxone 2gm IV daily  Duration: 6 wk End Date: oct 14th   [facility] Care Per Protocol:  Home health RN for IV administration and teaching; PICC line care and labs.    Labs weekly while on IV antibiotics: _x_ CBC with differential _x_ BMP __x CRP _x_ ESR   _x_ Please pull PIC at completion of IV antibiotics  Fax weekly labs to [phone]  Clinic Follow Up Appt: 4-5 wk  @ dr Newman Nip  I have personally spent 35 minutes involved in face-to-face and non-face-to-face activities for this patient on the day of the visit. Professional time spent includes the following activities: Preparing to see the patient (review of tests), Obtaining and/or reviewing separately obtained history (admission/discharge record), Performing a medically appropriate examination and/or evaluation , Ordering medications/tests/procedures, referring and communicating with other health care professionals, Documenting clinical information in the EMR, Independently interpreting results (not separately reported), Communicating results to the patient/family/caregiver, Counseling and educating the patient/family/caregiver and Care coordination (not separately reported).      [facility] Pager: [phone]  03/25/2023, 8:33 PM

## 2023-03-25 NOTE — Progress Notes (Signed)
Initial Nutrition Assessment  DOCUMENTATION CODES:   Obesity unspecified  INTERVENTION:   -Liberalize diet to regular for widest variety of meal selections -MVI with minerals daily -Ensure Enlive po TID, each supplement provides 350 kcal and 20 grams of protein.   NUTRITION DIAGNOSIS:   Increased nutrient needs related to post-op healing as evidenced by estimated needs.  GOAL:   Patient will meet greater than or equal to 90% of their needs  MONITOR:   PO intake, Supplement acceptance  REASON FOR ASSESSMENT:   Consult Assessment of nutrition requirement/status, Poor PO  ASSESSMENT:   Pt with medical history significant for DM, HTN, CAD s/p PCI and stent, dCHF, and anxiety. Pt with recent hospitalization for acute emolic stroke in 01/2023 and total rt hip replacement on 03/09/23. Pt presents with a 3-day PTA history of fever, nausea, dizziness and diarrhea.  Pt admitted with sepsis secondary to rt hip joint infection.  9/1- Procedure(s): RIGHT HIP IRRIGATION AND DEBRIDEMENT OF INFECTED TOTAL JOINT, EXCHANGE OF HEAD AND POLY COMPONENTS WITH PLACEMENT OF ANTIBIOTIC BEADS (Right)  Reviewed I/O's: +220 ml x 24 hours and +227 ml since admission  UOP: 500 ml x 24 hours  Pt resting quietly at time of visit. Spoke with pt's daughter, who reports intake has improved today. Family has been bringing outside food to pt- she consumed 50% of a container of chicken noodle soup prepared by daughter.   Pt with poor oral intake over the past 1-2 weeks. Daughter reports she has been consuming mostly bites and sips and complains that food has no seasoning.  Daughter agreeable to liberalizing diet to regular variety of meal selections. Pt daughter also thinks pt will drink Ensure, Encouraged to also bring outside food that pt enjoys.   Per TOC notes, plan to d/c to SNF once medically stable.   Reviewed wt hx; no wt loss noted over the past 3 months.   Medications reviewed and include  miralax, torsemide, and magnesium sulfate.   Lab Results  Component Value Date   HGBA1C 6.1 (H) 01/05/2023   PTA DM medications are none.   Labs reviewed: CBGS: 111 (inpatient orders for glycemic control are none).    NUTRITION - FOCUSED PHYSICAL EXAM:  Flowsheet Row Most Recent Value  Orbital Region No depletion  Upper Arm Region No depletion  Thoracic and Lumbar Region No depletion  Buccal Region No depletion  Temple Region No depletion  Clavicle Bone Region No depletion  Clavicle and Acromion Bone Region No depletion  Scapular Bone Region No depletion  Dorsal Hand No depletion  Patellar Region No depletion  Anterior Thigh Region No depletion  Posterior Calf Region No depletion  Edema (RD Assessment) None  Hair Reviewed  Eyes Reviewed  Mouth Reviewed  Skin Reviewed  Nails Reviewed       Diet Order:   Diet Order             Diet regular Fluid consistency: Thin  Diet effective now                   EDUCATION NEEDS:   Education needs have been addressed  Skin:  Skin Assessment: Skin Integrity Issues: Skin Integrity Issues:: Incisions, Other (Comment) Incisions: closed rt hip Other: non-pressure wound to sacrum, rash to umbilicus  Last BM:  03/19/23  Height:   Ht Readings from Last 1 Encounters:  03/19/23 5\' 2"  (1.575 m)    Weight:   Wt Readings from Last 1 Encounters:  03/25/23 92.7 kg  Ideal Body Weight:  50 kg  BMI:  Body mass index is 37.38 kg/m.  Estimated Nutritional Needs:   Kcal:  1500-1700  Protein:  75-90 grams  Fluid:  > 1.5 L    Levada Schilling, RD, LDN, CDCES Registered Dietitian II Certified Diabetes Care and Education Specialist Please refer to [facility] for RD and/or RD on-call/weekend/after hours pager

## 2023-03-25 NOTE — Progress Notes (Signed)
PT Cancellation Note  Patient Details Name: Brooke Davis MRN: [id number] DOB: 1941-07-14   Cancelled Treatment:     PT attempt BID/2nd session. Pt's three daughters in room," she just fell asleep. Can we let her rest?" Acute PT will continue to follow and return tomorrow morning. DC recs remain appropriate.    Rushie Chestnut 03/25/2023, 1:59 PM

## 2023-03-25 NOTE — Progress Notes (Addendum)
1      PROGRESS NOTE    Brooke Davis  YTK:[id number] DOB: [birth year] DOA: 03/18/2023 PCP: [name], MD    Brief Narrative:   82 y.o. female with medical history significant for DM2, HTN, CAD s/p PCI and stent on Plavix, dCHF, chronic A fib on Eliquis, and chronic anxiety, hospitalized in July 2024 with an acute embolic stroke while on Eliquis, with workup showing left carotid artery stenosis s/p L carotid stent placement on 02/25/23 and undergoing total right hip replacement a couple weeks later on 03/09/23,  who presents to the ED with a 3-day history of fever, nausea, dizziness, diarrhea, R hip pain and swelling.    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  Status post right hip irrigation and debridement on 03/21/2023 by orthopedic surgery Dr. Clemencia Course.  Infectious disease consulted and following.  Joint fluid culture growing rare Serratia Marcences, IV antibiotics de-escalated from cefepime and IV vancomycin to Rocephin on 03/23/2023, under the guidance of infectious disease.  Further hospital course and management as outlined below.   03/25/23 - pt reports feeling well overall. Denies hip pain at rest, some pain with PT but overall improving.  Pt denies other complaints.  Looks forward to rehab and getting her mobility back.  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 Clemencia Course on 03/21/2023. Joint fluid culture  growing rare Serratia Marcences, IV antibiotics de-escalated from cefepime and IV vancomycin to Rocephin on 03/23/2023, under the guidance of infectious disease. Continue as needed analgesics Appreciate infectious disease assistance. 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 Appreciate orthopedic surgery's assistance.  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   Curb sided with vascular surgery, Dr. Wyn Quaker, recommended to restart Eliquis and Plavix within 48 hours of I&D done on 03/21/2023.   Chronic diastolic CHF (congestive heart failure) (HCC) Euvolemic on exam. Resume torsemide (BP's now more elevated) Hold Entresto - resume when BP's will tolerate Continue strict I's and O's and daily weight   Atrial fibrillation, chronic (HCC) Chronic anticoagulation Currently rate controlled, on Eliquis for secondary CVA prevention. Metoprolol initially held, now resumed at lower dose 12.5 mg BID   CAD S/P percutaneous coronary angioplasty Continue atorvastatin and Plavix  Anxiety and depression Continue citalopram and alprazolam   Primary hypertension Hold off home oral antihypertensives due to soft BPs Continue to closely monitor vital signs.      DVT prophylaxis:  apixaban (ELIQUIS)  tablet 5 mg     Code Status: Currently DNR per the patient herself.  Short-term intubation okay.  No CPR / chest compressions.  See code status order for partial code limitations.  Family Communication: daughter at bedside on rounds, updated  Disposition Plan: SNF    Consultants:  Orthopedics Curb sided with vascular surgery, Dr. Wyn Quaker. Infectious disease.    Antimicrobials:  IV cefepime and vancomycin, stopped on 03/23/2023 Rocephin started on 03/23/2023.     Objective: Vitals:   03/24/23 2137 03/24/23 2217 03/25/23 0456 03/25/23 0759  BP: (!) 134/52 (!) 133/58  (!) 141/61  Pulse: 77 71  76  Resp:  18  16  Temp:  98 F (36.7 C)  98.2 F (36.8 C)  TempSrc:  Oral    SpO2: 97% 98%  100%  Weight:   92.7 kg   Height:        Intake/Output Summary (Last 24 hours) at 03/25/2023 1326 Last data filed at 03/25/2023 0900 Gross per 24 hour  Intake 100 ml  Output 550 ml  Net -450 ml   Filed Weights   03/21/23 0500 03/23/23 0418 03/25/23 0456  Weight: 85.6 kg 92.9 kg 92.7 kg    Examination:  General exam: awake, alert, no acute distress HEENT: moist mucus membranes, hearing grossly normal  Respiratory system: CTAB, no wheezes, rales or rhonchi, normal respiratory effort. Cardiovascular system: normal S1/S2, RRR, no pedal edema.   Gastrointestinal system: soft, NT, ND, no HSM felt, +bowel sounds. Central nervous system: A&O x 4. no gross focal neurologic deficits, normal speech Extremities: moves all, no edema, normal tone Skin: dry, intact, normal temperature Psychiatry: normal mood, congruent affect, judgement and insight appear normal      Data Reviewed: I have personally reviewed following labs and imaging studies  CBC: Recent Labs  Lab 03/18/23 2134 03/20/23 0605 03/21/23 0433 03/22/23 0519 03/23/23 0505 03/24/23 0034 03/24/23 0438 03/24/23 0842 03/25/23 0437  WBC 9.8   < > 8.4 9.3 15.9*  --  11.1*  --  9.5  NEUTROABS 8.8*  --   --   --   --   --   --    --   --   HGB 9.1*   < > 8.6* 9.2* 7.5* 8.2* 8.3* 8.8* 8.2*  HCT 27.9*   < > 26.6* 28.0* 22.5* 24.9* 24.5* 27.5* 25.3*  MCV 92.1   < > 92.0 91.8 91.8  --  90.7  --  91.7  PLT 290   < > 250 254 251  --  244  --  285   < > = values in this interval not displayed.   Basic Metabolic Panel: Recent Labs  Lab 03/20/23 0605 03/21/23 0433 03/22/23 0519 03/23/23 0505 03/25/23 0437  NA 136 134* 136 135 137  K 3.6 3.5 4.1 4.7 4.2  CL 103 100 105 102 105  CO2 26 26 23 22 25   GLUCOSE 155* 111* 212* 167* 125*  BUN 17 17 25* 34* 34*  CREATININE 1.17* 1.17* 1.28* 1.43*  1.36* 1.16*  CALCIUM 8.3* 8.3* 8.4* 8.6* 8.9  MG 1.4*  --   --  1.4*  --   PHOS  --   --   --  3.2  --    GFR: Estimated Creatinine Clearance:  41 mL/min (A) (by C-G formula based on SCr of 1.16 mg/dL (H)). Liver Function Tests: Recent Labs  Lab 03/18/23 2216  AST 19  ALT 12  ALKPHOS 85  BILITOT 0.9  PROT 6.5  ALBUMIN 2.9*   No results for input(s): "LIPASE", "AMYLASE" in the last 168 hours. No results for input(s): "AMMONIA" in the last 168 hours. Coagulation Profile: Recent Labs  Lab 03/18/23 2216  INR 2.2*   Sepsis Labs: Recent Labs  Lab 03/18/23 2134 03/19/23 0142  PROCALCITON  --  0.46  LATICACIDVEN 1.7 1.2    Recent Results (from the past 240 hour(s))  Blood Culture (routine x 2)     Status: None   Collection Time: 03/18/23  9:34 PM   Specimen: BLOOD RIGHT HAND  Result Value Ref Range Status   Specimen Description BLOOD RIGHT HAND  Final   Special Requests   Final    BOTTLES DRAWN AEROBIC ONLY Blood Culture adequate volume   Culture   Final    NO GROWTH 5 DAYS Performed at [facility], [address] [place], [place] [address]    Report Status 03/23/2023 FINAL  Final  Blood Culture (routine x 2)     Status: None   Collection Time: 03/18/23  9:36 PM   Specimen: BLOOD LEFT HAND  Result Value Ref Range Status   Specimen Description BLOOD LEFT HAND  Final   Special Requests    Final    BOTTLES DRAWN AEROBIC AND ANAEROBIC Blood Culture adequate volume   Culture   Final    NO GROWTH 5 DAYS Performed at [facility], [address] [place], [place] [address]    Report Status 03/23/2023 FINAL  Final  Resp panel by RT-PCR (RSV, Flu A&B, Covid) Anterior Nasal Swab     Status: None   Collection Time: 03/18/23 10:28 PM   Specimen: Anterior Nasal Swab  Result Value Ref Range Status   SARS Coronavirus 2 by RT PCR NEGATIVE NEGATIVE Final    Comment: (NOTE) SARS-CoV-2 target nucleic acids are NOT DETECTED.  The SARS-CoV-2 RNA is generally detectable in upper respiratory specimens during the acute phase of infection. The lowest concentration of SARS-CoV-2 viral copies this assay can detect is 138 copies/mL. A negative result does not preclude SARS-Cov-2 infection and should not be used as the sole basis for treatment or other patient management decisions. A negative result may occur with  improper specimen collection/handling, submission of specimen other than nasopharyngeal swab, presence of viral mutation(s) within the areas targeted by this assay, and inadequate number of viral copies(<138 copies/mL). A negative result must be combined with clinical observations, patient history, and epidemiological information. The expected result is Negative.  Fact Sheet for Patients:  BloggerCourse.com  Fact Sheet for Healthcare Providers:  SeriousBroker.it  This test is no t yet approved or cleared by the [place] FDA and  has been authorized for detection and/or diagnosis of SARS-CoV-2 by FDA under an Emergency Use Authorization (EUA). This EUA will remain  in effect (meaning this test can be used) for the duration of the COVID-19 declaration under Section 564(b)(1) of the Act, 21 U.S.C.section 360bbb-3(b)(1), unless the authorization is terminated  or revoked sooner.       Influenza A by PCR NEGATIVE  NEGATIVE Final   Influenza B by PCR NEGATIVE NEGATIVE Final    Comment: (NOTE) The Xpert Xpress SARS-CoV-2/FLU/RSV plus assay is intended as an aid in the diagnosis of influenza from Nasopharyngeal swab specimens and should  not be used as a sole basis for treatment. Nasal washings and aspirates are unacceptable for Xpert Xpress SARS-CoV-2/FLU/RSV testing.  Fact Sheet for Patients: BloggerCourse.com  Fact Sheet for Healthcare Providers: SeriousBroker.it  This test is not yet approved or cleared by the [place] FDA and has been authorized for detection and/or diagnosis of SARS-CoV-2 by FDA under an Emergency Use Authorization (EUA). This EUA will remain in effect (meaning this test can be used) for the duration of the COVID-19 declaration under Section 564(b)(1) of the Act, 21 U.S.C. section 360bbb-3(b)(1), unless the authorization is terminated or revoked.     Resp Syncytial Virus by PCR NEGATIVE NEGATIVE Final    Comment: (NOTE) Fact Sheet for Patients: BloggerCourse.com  Fact Sheet for Healthcare Providers: SeriousBroker.it  This test is not yet approved or cleared by the [place] FDA and has been authorized for detection and/or diagnosis of SARS-CoV-2 by FDA under an Emergency Use Authorization (EUA). This EUA will remain in effect (meaning this test can be used) for the duration of the COVID-19 declaration under Section 564(b)(1) of the Act, 21 U.S.C. section 360bbb-3(b)(1), unless the authorization is terminated or revoked.  Performed at [facility], [address] [place], [place] [address]   MRSA Next Gen by PCR, Nasal     Status: None   Collection Time: 03/19/23 11:02 AM   Specimen: Nasal Mucosa; Nasal Swab  Result Value Ref Range Status   MRSA by PCR Next Gen NOT DETECTED NOT DETECTED Final    Comment: (NOTE) The GeneXpert MRSA Assay (FDA  approved for NASAL specimens only), is one component of a comprehensive MRSA colonization surveillance program. It is not intended to diagnose MRSA infection nor to guide or monitor treatment for MRSA infections. Test performance is not FDA approved in patients less than 16 years old. Performed at [facility], [address] [place], [place] [address]   Aerobic Culture w Gram Stain (superficial specimen)     Status: None   Collection Time: 03/21/23 12:00 PM   Specimen: Wound  Result Value Ref Range Status   Specimen Description   Final    WOUND Performed at [facility], [address] [place], [place] [address]    Special Requests   Final    NONE Performed at [facility], [address] [place], [place] [address]    Gram Stain   Final    MODERATE WBC PRESENT,BOTH PMN AND MONONUCLEAR NO ORGANISMS SEEN    Culture   Final    RARE SERRATIA MARCESCENS SUSCEPTIBILITIES PERFORMED ON PREVIOUS CULTURE WITHIN THE LAST 5 DAYS. Performed at [facility], [address], [place], [place] [address]    Report Status 03/23/2023 FINAL  Final  Aerobic/Anaerobic Culture w Gram Stain (surgical/deep wound)     Status: None (Preliminary result)   Collection Time: 03/21/23 12:01 PM   Specimen: Wound; Body Fluid  Result Value Ref Range Status   Specimen Description   Final    WOUND Performed at [facility], [address] [place], [place] [address]    Special Requests   Final    NONE Performed at [facility], [address] [place], [place] [address]    Gram Stain   Final    FEW WBC PRESENT,BOTH PMN AND MONONUCLEAR NO ORGANISMS SEEN Performed at [facility], [address] [place], [place] [address]    Culture   Final    RARE SERRATIA MARCESCENS NO ANAEROBES ISOLATED;  CULTURE IN PROGRESS FOR 5 DAYS    Report Status PENDING  Incomplete   Organism ID, Bacteria SERRATIA MARCESCENS  Final      Susceptibility    Serratia marcescens - MIC*    CEFEPIME <=0.12 SENSITIVE Sensitive     CEFTAZIDIME <=1 SENSITIVE Sensitive     CEFTRIAXONE <=0.25 SENSITIVE Sensitive     CIPROFLOXACIN <=0.25 SENSITIVE Sensitive     GENTAMICIN <=1 SENSITIVE Sensitive     TRIMETH/SULFA <=20 SENSITIVE Sensitive     * RARE SERRATIA MARCESCENS  Aerobic Culture w Gram Stain (superficial specimen)     Status: None   Collection Time: 03/21/23 12:01 PM   Specimen: Wound  Result Value Ref Range Status   Specimen Description   Final    WOUND Performed at [facility], [address], [place], [place] [address]    Special Requests   Final    NONE Performed at [facility], [address] [place], [place] [address]    Gram Stain   Final    FEW WBC PRESENT,BOTH PMN AND MONONUCLEAR NO ORGANISMS SEEN Performed at [facility], [address] [place], [place] [address]    Culture RARE SERRATIA MARCESCENS  Final   Report Status 03/23/2023 FINAL  Final   Organism ID, Bacteria SERRATIA MARCESCENS  Final      Susceptibility   Serratia marcescens - MIC*    CEFEPIME <=0.12 SENSITIVE Sensitive     CEFTAZIDIME <=1 SENSITIVE Sensitive     CEFTRIAXONE <=0.25 SENSITIVE Sensitive     CIPROFLOXACIN <=0.25 SENSITIVE Sensitive     GENTAMICIN <=1 SENSITIVE Sensitive     TRIMETH/SULFA <=20 SENSITIVE Sensitive     * RARE SERRATIA MARCESCENS         Radiology Studies: [place] EKG SITE RITE  Result Date: 03/24/2023 If Site Rite image not attached, placement could not be confirmed due to current cardiac rhythm.       Scheduled Meds:  allopurinol  300 mg Oral Daily   apixaban  5 mg Oral BID   atorvastatin  80 mg Oral Daily   Chlorhexidine Gluconate Cloth  6 each Topical Daily   citalopram  20 mg Oral Daily   clopidogrel  75 mg Oral Daily   metoprolol tartrate  12.5 mg Oral BID   polyethylene glycol  17 g Oral BID   senna  2 tablet Oral BID   sodium chloride flush  10-40 mL Intracatheter Q12H    Continuous Infusions:  cefTRIAXone (ROCEPHIN)  IV 2 g (03/24/23 1642)     LOS: 7 days    Time spent: 35 minutes    Pennie Banter, DO [facility] Pager [phone]  If 7PM-7AM, please contact night-coverage [email or web address]  03/25/2023, 1:26 PM

## 2023-03-25 NOTE — Plan of Care (Signed)

## 2023-03-25 NOTE — Progress Notes (Signed)
Physical Therapy Treatment Patient Details Name: BUDDY CRUNKLETON MRN: [id number] DOB: [birth year] Today's Date: 03/25/2023   History of Present Illness Pt is an 82 year old female s/p washout of R thigh collection with washout of total hip arthroplasty with poly exchange and head exchange; PMH significant for DM2, HTN, CAD s/p PCI and stent, dCHF A fib on Eliquis, hospitalized in July 2024 for elective hip replacement, again hospitalized in July 2024 with an acute embolic stroke while on Eliquis, with workup showing left carotid artery stenosis, s/p carotid stent placement on 02/25/23    PT Comments  Pt was long sitting in bed with supportive daughter at bedside. She is A and O x 3 but agreeable and motivated. States she feels better today versus previous date. Does demonstrate improved abilities to safely exit bed, stand, and ambulate ~ twice as far as previous date. She still is not at her baseline and will benefit from continued skilled PT to maximize her independence and safety with all ADLs.    If plan is discharge home, recommend the following: A little help with walking and/or transfers;A little help with bathing/dressing/bathroom;Direct supervision/assist for medications management;Direct supervision/assist for financial management;Assist for transportation;Help with stairs or ramp for entrance;Assistance with cooking/housework     Equipment Recommendations  Other (comment) (Defer to next level of care)       Precautions / Restrictions Precautions Precautions: Posterior Hip;Fall Precaution Booklet Issued: Yes (comment) Restrictions Weight Bearing Restrictions: Yes RLE Weight Bearing: Weight bearing as tolerated LLE Weight Bearing: Weight bearing as tolerated Other Position/Activity Restrictions: RLE WBAT     Mobility  Bed Mobility Overal bed mobility: Needs Assistance Bed Mobility: Supine to Sit  Supine to sit: Min assist  General bed mobility comments: increased time to perform     Transfers Overall transfer level: Needs assistance Equipment used: Rolling walker (2 wheels) Transfers: Sit to/from Stand Sit to Stand: Min assist     Ambulation/Gait Ambulation/Gait assistance: Contact guard assist Gait Distance (Feet): 60 Feet Assistive device: Rolling walker (2 wheels) Gait Pattern/deviations: Step-through pattern, Decreased step length - right, Decreased step length - left, Decreased stride length Gait velocity: decreased  General Gait Details: pt was able to ambulate ~ 60 ft with RW. no LOB but pt is limited by pain/fatigue   Balance Overall balance assessment: Needs assistance Sitting-balance support: No upper extremity supported, Feet supported Sitting balance-Leahy Scale: Good     Standing balance support: Bilateral upper extremity supported, Reliant on assistive device for balance Standing balance-Leahy Scale: Fair       Cognition Arousal: Alert Behavior During Therapy: WFL for tasks assessed/performed  General Comments: Pt is alert and agreeable. per family, "seems to be at baseline."        Exercises Other Exercises Other Exercises: Will return this afternoon to advance strength and POC per pt tolerance.        Pertinent Vitals/Pain Pain Assessment Pain Assessment: 0-10 Pain Score: 3  Pain Location: R hip Pain Descriptors / Indicators: Discomfort, Sore Pain Intervention(s): Limited activity within patient's tolerance, Monitored during session, Premedicated before session, Repositioned     PT Goals (current goals can now be found in the care plan section) Acute Rehab PT Goals Patient Stated Goal: to get stronger Progress towards PT goals: Progressing toward goals    Frequency    BID       AM-PAC PT "6 Clicks" Mobility   Outcome Measure  Help needed turning from your back to your side while in a flat  bed without using bedrails?: A Little Help needed moving from lying on your back to sitting on the side of a flat bed without  using bedrails?: A Lot Help needed moving to and from a bed to a chair (including a wheelchair)?: A Little Help needed standing up from a chair using your arms (e.g., wheelchair or bedside chair)?: A Little Help needed to walk in hospital room?: A Little Help needed climbing 3-5 steps with a railing? : A Lot 6 Click Score: 16    End of Session   Activity Tolerance: Patient tolerated treatment well Patient left: in chair;with call bell/phone within reach;with chair alarm set;with family/visitor present;Other (comment) Nurse Communication: Mobility status;Precautions;Weight bearing status PT Visit Diagnosis: Other abnormalities of gait and mobility (R26.89);Unsteadiness on feet (R26.81);Muscle weakness (generalized) (M62.81);Difficulty in walking, not elsewhere classified (R26.2);Pain Pain - Right/Left: Right Pain - part of body: Hip     Time: 2130-8657 PT Time Calculation (min) (ACUTE ONLY): 17 min  Charges:    $Gait Training: 8-22 mins PT General Charges $$ ACUTE PT VISIT: 1 Visit                     Jetta Lout PTA 03/25/23, 12:52 PM

## 2023-03-26 DIAGNOSIS — Z7901 Long term (current) use of anticoagulants: Secondary | ICD-10-CM | POA: Diagnosis not present

## 2023-03-26 DIAGNOSIS — Z96641 Presence of right artificial hip joint: Secondary | ICD-10-CM | POA: Diagnosis not present

## 2023-03-26 DIAGNOSIS — T8450XA Infection and inflammatory reaction due to unspecified internal joint prosthesis, initial encounter: Secondary | ICD-10-CM | POA: Diagnosis not present

## 2023-03-26 DIAGNOSIS — A498 Other bacterial infections of unspecified site: Secondary | ICD-10-CM

## 2023-03-26 LAB — MAGNESIUM: Magnesium: 2.1 mg/dL (ref 1.7–2.4)

## 2023-03-26 LAB — BASIC METABOLIC PANEL
Anion gap: 6 (ref 5–15)
BUN: 39 mg/dL — ABNORMAL HIGH (ref 8–23)
CO2: 27 mmol/L (ref 22–32)
Calcium: 9.1 mg/dL (ref 8.9–10.3)
Chloride: 102 mmol/L (ref 98–111)
Creatinine, Ser: 1.1 mg/dL — ABNORMAL HIGH (ref 0.44–1.00)
GFR, Estimated: 51 mL/min — ABNORMAL LOW (ref >60)
Glucose, Bld: 151 mg/dL — ABNORMAL HIGH (ref 70–99)
Potassium: 3.6 mmol/L (ref 3.5–5.1)
Sodium: 135 mmol/L (ref 135–145)

## 2023-03-26 LAB — CBC
HCT: 25.9 % — ABNORMAL LOW (ref 36.0–46.0)
Hemoglobin: 8.3 g/dL — ABNORMAL LOW (ref 12.0–15.0)
MCH: 30.3 pg (ref 26.0–34.0)
MCHC: 32 g/dL (ref 30.0–36.0)
MCV: 94.5 fL (ref 80.0–100.0)
Platelets: 290 10*3/uL (ref 150–400)
RBC: 2.74 MIL/uL — ABNORMAL LOW (ref 3.87–5.11)
RDW: 16.4 % — ABNORMAL HIGH (ref 11.5–15.5)
WBC: 10 10*3/uL (ref 4.0–10.5)
nRBC: 0 % (ref 0.0–0.2)

## 2023-03-26 LAB — AEROBIC/ANAEROBIC CULTURE W GRAM STAIN (SURGICAL/DEEP WOUND)

## 2023-03-26 NOTE — Progress Notes (Signed)
Physical Therapy Treatment Patient Details Name: Brooke Davis MRN: [id number] DOB: July 10, 1941 Today's Date: 03/26/2023   History of Present Illness Pt is an 82 year old female s/p washout of R thigh collection with washout of total hip arthroplasty with poly exchange and head exchange; PMH significant for DM2, HTN, CAD s/p PCI and stent, dCHF A fib on Eliquis, hospitalized in July 2024 for elective hip replacement, again hospitalized in July 2024 with an acute embolic stroke while on Eliquis, with workup showing left carotid artery stenosis, s/p carotid stent placement on 02/25/23    PT Comments  Pt was long sitting in bed upon arrival. She is A, O, and agreeable to session. Pt was able to exit bed with min assist. Increased time required + use of bed rails (HOB was slightly elevated). Pt was able to stand and ambulate with CGA only. Distance limited by fatigue. Overall pt is progressing well. She will continue to benefit from acute PT to maximize independence an safety with all ADLs. PT will continue to follow until DC. DC recs remain appropriate.   If plan is discharge home, recommend the following: A little help with walking and/or transfers;A little help with bathing/dressing/bathroom;Direct supervision/assist for medications management;Direct supervision/assist for financial management;Assist for transportation;Help with stairs or ramp for entrance;Assistance with cooking/housework     Equipment Recommendations  Other (comment) (Defer to next level of care)       Precautions / Restrictions Precautions Precautions: Posterior Hip;Fall Precaution Booklet Issued: Yes (comment) Restrictions Weight Bearing Restrictions: Yes RLE Weight Bearing: Weight bearing as tolerated Other Position/Activity Restrictions: RLE WBAT     Mobility  Bed Mobility Overal bed mobility: Needs Assistance Bed Mobility: Supine to Sit  Supine to sit: Min assist   Transfers Overall transfer level: Needs  assistance Equipment used: Rolling walker (2 wheels) Transfers: Sit to/from Stand Sit to Stand: Contact guard assist  General transfer comment: Pt was able to stand from EOB with CGA for safety    Ambulation/Gait Ambulation/Gait assistance: Contact guard assist Gait Distance (Feet): 70 Feet Assistive device: Rolling walker (2 wheels) Gait Pattern/deviations: Step-through pattern Gait velocity: decreased  General Gait Details: Pt was able to ambulate ~ 70 ft with RW without LOB or safety concern. pt was cued for improved heel strike to toe off gait sequencing   Balance Overall balance assessment: Needs assistance Sitting-balance support: No upper extremity supported, Feet supported Sitting balance-Leahy Scale: Good     Standing balance support: Bilateral upper extremity supported, Reliant on assistive device for balance Standing balance-Leahy Scale: Good Standing balance comment: reliant on RW for dynamic standing activity however no LOB throughout all standing task. is able to let go of RW to assist with application of briefs.      Cognition Arousal: Alert Behavior During Therapy: WFL for tasks assessed/performed Overall Cognitive Status: Within Functional Limits for tasks assessed      General Comments: Pt is A and O x 4.               Pertinent Vitals/Pain Pain Assessment Pain Assessment: 0-10 Pain Score: 2  Pain Location: R hip Pain Descriptors / Indicators: Discomfort, Sore Pain Intervention(s): Limited activity within patient's tolerance, Monitored during session, Premedicated before session, Repositioned     PT Goals (current goals can now be found in the care plan section) Acute Rehab PT Goals Patient Stated Goal: to get stronger Progress towards PT goals: Progressing toward goals    Frequency    BID  AM-PAC PT "6 Clicks" Mobility   Outcome Measure  Help needed turning from your back to your side while in a flat bed without using bedrails?: A  Little Help needed moving from lying on your back to sitting on the side of a flat bed without using bedrails?: A Little Help needed moving to and from a bed to a chair (including a wheelchair)?: A Little Help needed standing up from a chair using your arms (e.g., wheelchair or bedside chair)?: A Little Help needed to walk in hospital room?: A Little Help needed climbing 3-5 steps with a railing? : A Lot 6 Click Score: 17    End of Session   Activity Tolerance: Patient tolerated treatment well Patient left: in chair;with call bell/phone within reach;with chair alarm set;with family/visitor present;Other (comment) Nurse Communication: Mobility status;Precautions;Weight bearing status PT Visit Diagnosis: Other abnormalities of gait and mobility (R26.89);Unsteadiness on feet (R26.81);Muscle weakness (generalized) (M62.81);Difficulty in walking, not elsewhere classified (R26.2);Pain Pain - Right/Left: Right Pain - part of body: Hip     Time: 0752-0809 PT Time Calculation (min) (ACUTE ONLY): 17 min  Charges:    $Gait Training: 8-22 mins PT General Charges $$ ACUTE PT VISIT: 1 Visit                     Jetta Lout PTA 03/26/23, 9:30 AM

## 2023-03-26 NOTE — TOC Progression Note (Signed)
Transition of Care [facility]) - Progression Note    Patient Details  Name: Brooke Davis MRN: [id number] Date of Birth: Dec 04, 1940  [facility]) CM/SW Contact  Garret Reddish, RN Phone Number: 03/26/2023, 12:48 PM  Clinical Narrative:    SNF authorization continues to be pending.  TOC will continue to follow for discharge planning.          Expected Discharge Plan and Services                                               Social Determinants of Health (SDOH) Interventions SDOH Screenings   Food Insecurity: No Food Insecurity (03/19/2023)  Housing: Low Risk  (03/19/2023)  Transportation Needs: No Transportation Needs (03/19/2023)  Utilities: Not At Risk (03/19/2023)  Financial Resource Strain: Patient Declined (11/05/2022)   Received from [facility]  Tobacco Use: Medium Risk (03/18/2023)    Readmission Risk Interventions    03/01/2023   10:20 AM  Readmission Risk Prevention Plan  Transportation Screening Complete  PCP or Specialist Appt within 3-5 Days Complete  HRI or Home Care Consult Complete  Social Work Consult for Recovery Care Planning/Counseling Complete  Palliative Care Screening Not Applicable  Medication Review [employer]) Complete

## 2023-03-26 NOTE — Progress Notes (Signed)
Subjective: 5 Days Post-Op Procedure(s) (LRB): RIGHT HIP IRRIGATION AND DEBRIDEMENT OF INFECTED TOTAL JOINT, EXCHANGE OF HEAD AND POLY COMPONENTS WITH PLACEMENT OF ANTIBIOTIC BEADS (Right) Patient reports pain as  mild to minimal while not moving.  States it is uncomfortable with PT, but improving .   Patient is well, and has had no acute complaints or problems.  Denies any chest pain, shortness of breath, nausea, vomiting, fevers or chills.  States that she has not been able to move her bowels yet.  Continue to monitor this. We will continue with therapy today.  She has done well so far with PT able to get out of bed and ambulate improving distances. Talked with PT and patient was able to get out to the nurses station today, still just requires some help getting out of bed. Seems to be improving day to day. Plan is to go Rehab after hospital stay.  Objective: Vital signs in last 24 hours: Temp:  [98 F (36.7 C)-98.5 F (36.9 C)] 98 F (36.7 C) (09/06 0757) Pulse Rate:  [69-77] 77 (09/06 0757) Resp:  [16-18] 17 (09/06 0757) BP: (133-144)/(47-54) 143/54 (09/06 0757) SpO2:  [98 %-100 %] 98 % (09/06 0757) Weight:  [93 kg] 93 kg (09/06 0500)  Intake/Output from previous day:  Intake/Output Summary (Last 24 hours) at 03/26/2023 0822 Last data filed at 03/26/2023 0520 Gross per 24 hour  Intake 119.1 ml  Output 2000 ml  Net -1880.9 ml    Intake/Output this shift: No intake/output data recorded.  Labs: Recent Labs    03/24/23 0034 03/24/23 0438 03/24/23 0842 03/25/23 0437 03/26/23 0530  HGB 8.2* 8.3* 8.8* 8.2* 8.3*   Recent Labs    03/25/23 0437 03/26/23 0530  WBC 9.5 10.0  RBC 2.76* 2.74*  HCT 25.3* 25.9*  PLT 285 290   Recent Labs    03/25/23 0437 03/26/23 0530  NA 137 135  K 4.2 3.6  CL 105 102  CO2 25 27  BUN 34* 39*  CREATININE 1.16* 1.10*  GLUCOSE 125* 151*  CALCIUM 8.9 9.1   No results for input(s): "LABPT", "INR" in the last 72 hours.  EXAM General -  Patient is Alert, Appropriate, and Oriented.  Patient is hard of hearing, hearing aids Extremity - Neurologically intact Neurovascular intact Sensation intact distally Intact pulses distally Dorsiflexion/Plantar flexion intact No cellulitis present Compartment soft Dressing - some drainage over the distal aspect of the aquacel. A new bandage was applied over the drain site as there was some urine that had saturated the gauze. Area cleaned with alcohol prior to redressing. Motor Function - intact, moving foot and toes well on exam.  Patient able to plantar and dorsiflex with good strength and range of motion.  Patient has some difficulty performing SLR, but is able to 2 with mild assist from provider, does report some increased pain.  Patient is neurovascularly intact down her right lower extremity to all dermatomes, posterior tibial pulses appreciated 2+. Deep and superficial JP Drain site dry without any drainage appreciated over bandages.  Past Medical History:  Diagnosis Date   Anxiety    a.) on BZO (alprazolam) PRN   Arthritis    fingers, right hip   Atrial fibrillation (HCC) 11/10/2013   a.) CHA2DS2VASc = 5 (age x2, sex, HTN, T2DM);  b.) rate/rhythm maintained on oral metoprolol tartrate; chronically anticoagulated with apixaban   Bronchitis    Cerebrovascular small vessel disease (chronic)    Coronary artery disease involving native coronary artery of native  heart 01/11/2021   a.) LHC 01/11/2021: 40% oLM, 50% mLCx, 30% oLCx, 30% pLAD, 30% mLAD, 99% pRCA (2.75 x 30 mm Resolute Onyx DES), 75% p-mRCA, 60% dRCA   DDD (degenerative disc disease), lumbar 12/01/2013   Diastolic dysfunction 01/12/2021   a.) TTE 01/12/2021 (s/p inf STEMI): EF 55-60%, mild-mod LAE, mild RAE, mod RVE, mild AR/MR, mod TR, G1DD   Diet-controlled type 2 diabetes mellitus (HCC)    Full dentures    Gout    History of bilateral cataract extraction 2021   Hyperlipidemia    Hypertension, essential    Long term  current use of anticoagulant    a.) apixaban   PONV (postoperative nausea and vomiting)    Sinoatrial node dysfunction (HCC) 11/10/2013   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) 01/11/2021   a.) LHC/PCI 01/11/2021 --> culprit lesion 99% pRCA --> 2.75 x 30 mm Resolute Onyx DES x 1   Varicose veins of legs    Wears hearing aid in both ears     Assessment/Plan: 5 Days Post-Op Procedure(s) (LRB): RIGHT HIP IRRIGATION AND DEBRIDEMENT OF INFECTED TOTAL JOINT, EXCHANGE OF HEAD AND POLY COMPONENTS WITH PLACEMENT OF ANTIBIOTIC BEADS (Right) Principal Problem:   Sepsis (HCC) Active Problems:   Primary hypertension   Hx of total hip arthroplasty, 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   Weight as of this encounter: 93 kg.  Her white cell count and hemoglobin have improved from yesterday.  White cell count now at 10 from 9.5.  Hemoglobin seems to stabilized 8.2 yesterday and 8.3 today.  Will continue to trend these values.  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  Switched to ceftriaxone.  PICC in place, plan for 6 weeks abx per ID then transition to oral abx for at least 3 months.  Patient will continue to work with physical therapy to work on postoperative PT ambulation, gait, ROM and strengthening of her right lower extremity.  Hip Preacutions discussed  Discussed with the patient continuing to utilize ice over the bandage  New drain dressing applied, site  cleaned  Vascular and hospitalist team has suggested the patient be placed on Plavix/Eliquis, continue on this regimen for DVT prophylaxis.  Weight-Bearing as tolerated to right leg  TOC has found placement at [facility]  Patient will follow-up with [facility] orthopedics in 2 weeks for reevaluation   Rayburn Go, PA-C [facility] 03/26/2023, 8:22 AM

## 2023-03-26 NOTE — Progress Notes (Signed)
Physical Therapy Treatment Patient Details Name: Brooke Davis MRN: [id number] DOB: Oct 18, 1940 Today's Date: 03/26/2023   History of Present Illness Pt is an 82 year old female s/p washout of R thigh collection with washout of total hip arthroplasty with poly exchange and head exchange; PMH significant for DM2, HTN, CAD s/p PCI and stent, dCHF A fib on Eliquis, hospitalized in July 2024 for elective hip replacement, again hospitalized in July 2024 with an acute embolic stroke while on Eliquis, with workup showing left carotid artery stenosis, s/p carotid stent placement on 02/25/23    PT Comments  Pt was long sitting in bed upon arrival. She endorses just recently getting back into bed and that she was "tired today." She requested not to get OOB again but was agreeable to performing exercises to promote return in function and strength of BLEs.See exercises listed below. Pt remains weak but is improving overall with strength and safe functional abilities. Acute therapy will continue to follow and progress as able per current POC.    If plan is discharge home, recommend the following: A little help with walking and/or transfers;A little help with bathing/dressing/bathroom;Direct supervision/assist for medications management;Direct supervision/assist for financial management;Assist for transportation;Help with stairs or ramp for entrance;Assistance with cooking/housework     Equipment Recommendations  Other (comment) (Defer to next level of care)       Precautions / Restrictions Precautions Precautions: Posterior Hip;Fall Precaution Booklet Issued: Yes (comment) Restrictions Weight Bearing Restrictions: Yes RLE Weight Bearing: Weight bearing as tolerated LLE Weight Bearing: Weight bearing as tolerated Other Position/Activity Restrictions: RLE WBAT     Mobility  Bed Mobility Overal bed mobility: Needs Assistance Bed Mobility: Supine to Sit  Supine to sit: Min assist  General bed mobility  comments: pt did not want to get OOB again this afternoon but did fully participate in HEP exercises 2 x. See exercises listed below.    Transfers Overall transfer level: Needs assistance Equipment used: Rolling walker (2 wheels) Transfers: Sit to/from Stand Sit to Stand: Contact guard assist  General transfer comment: Pt was able to stand from EOB with CGA for safety    Ambulation/Gait Ambulation/Gait assistance: Contact guard assist Gait Distance (Feet): 70 Feet Assistive device: Rolling walker (2 wheels) Gait Pattern/deviations: Step-through pattern Gait velocity: decreased  General Gait Details: Pt was able to ambulate ~ 70 ft with RW without LOB or safety concern. pt was cued for improved heel strike to toe off gait sequencing   Balance Overall balance assessment: Needs assistance Sitting-balance support: No upper extremity supported, Feet supported Sitting balance-Leahy Scale: Good     Standing balance support: Bilateral upper extremity supported, Reliant on assistive device for balance Standing balance-Leahy Scale: Good Standing balance comment: reliant on RW for dynamic standing activity however no LOB throughout all standing task. is able to let go of RW to assist with application of briefs.    Cognition Arousal: Alert Behavior During Therapy: WFL for tasks assessed/performed Overall Cognitive Status: Within Functional Limits for tasks assessed    General Comments: Pt is A and O but requested not to get OOB." Im just tired today." Pt did agree to performing HEP exercises in bed        Exercises Total Joint Exercises Ankle Circles/Pumps: AROM, Both (2 x 10) Quad Sets: AROM, Both, 10 reps (2 set) Gluteal Sets: 10 reps Towel Squeeze: AROM, 10 reps, Supine Heel Slides: Supine, 10 reps, Both (2 sets) Hip ABduction/ADduction: AROM, 10 reps, Supine, Both (2 sets) Straight Leg  Raises: Supine, 10 reps, Both (throughlimited range to adhere to hip precautions (2 sets))         Pertinent Vitals/Pain Pain Assessment Pain Assessment: 0-10 Pain Score: 1  Pain Location: R hip Pain Descriptors / Indicators: Discomfort, Sore Pain Intervention(s): Limited activity within patient's tolerance, Monitored during session, Premedicated before session, Repositioned     PT Goals (current goals can now be found in the care plan section) Acute Rehab PT Goals Patient Stated Goal: get better so I can safely go home Progress towards PT goals: Progressing toward goals    Frequency    BID       AM-PAC PT "6 Clicks" Mobility   Outcome Measure  Help needed turning from your back to your side while in a flat bed without using bedrails?: A Little Help needed moving from lying on your back to sitting on the side of a flat bed without using bedrails?: A Little Help needed moving to and from a bed to a chair (including a wheelchair)?: A Little Help needed standing up from a chair using your arms (e.g., wheelchair or bedside chair)?: A Little Help needed to walk in hospital room?: A Little Help needed climbing 3-5 steps with a railing? : A Lot 6 Click Score: 17    End of Session Equipment Utilized During Treatment: Gait belt Activity Tolerance: Patient tolerated treatment well Patient left: in bed;with call bell/phone within reach;with bed alarm set Nurse Communication: Mobility status;Precautions;Weight bearing status PT Visit Diagnosis: Other abnormalities of gait and mobility (R26.89);Unsteadiness on feet (R26.81);Muscle weakness (generalized) (M62.81);Difficulty in walking, not elsewhere classified (R26.2);Pain Pain - Right/Left: Right Pain - part of body: Hip     Time: 4627-0350 PT Time Calculation (min) (ACUTE ONLY): 20 min  Charges:    $Therapeutic Exercise: 8-22 mins PT General Charges $$ ACUTE PT VISIT: 1 Visit                     Jetta Lout PTA 03/26/23, 2:03 PM

## 2023-03-26 NOTE — Plan of Care (Signed)
  Problem: Clinical Measurements: Goal: Diagnostic test results will improve Outcome: Progressing Goal: Signs and symptoms of infection will decrease Outcome: Progressing   Problem: Education: Goal: Knowledge of General Education information will improve Description: Including pain rating scale, medication(s)/side effects and non-pharmacologic comfort measures Outcome: Progressing   Problem: Health Behavior/Discharge Planning: Goal: Ability to manage health-related needs will improve Outcome: Progressing   Problem: Nutrition: Goal: Adequate nutrition will be maintained Outcome: Progressing   Problem: Coping: Goal: Level of anxiety will decrease Outcome: Progressing   Problem: Pain Managment: Goal: General experience of comfort will improve Outcome: Progressing

## 2023-03-26 NOTE — Progress Notes (Addendum)
1      PROGRESS NOTE    Brooke Davis  ZOX:[id number] DOB: 26-Jan-1941 DOA: 03/18/2023 PCP: [name], MD    Brief Narrative:   82 y.o. female with medical history significant for DM2, HTN, CAD s/p PCI and stent on Plavix, dCHF, chronic A fib on Eliquis, and chronic anxiety, hospitalized in July 2024 with an acute embolic stroke while on Eliquis, with workup showing left carotid artery stenosis s/p L carotid stent placement on 02/25/23 and undergoing total right hip replacement a couple weeks later on 03/09/23,  who presents to the ED with a 3-day history of fever, nausea, dizziness, diarrhea, R hip pain and swelling.    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  Status post right hip irrigation and debridement on 03/21/2023 by orthopedic surgery Dr. Clemencia Course.  Infectious disease consulted and following.  Joint fluid culture growing rare Serratia Marcences, IV antibiotics de-escalated from cefepime and IV vancomycin to Rocephin on 03/23/2023, under the guidance of infectious disease.  Further hospital course and management as outlined below.    03/26/23 - Pt seen with family this AM.  She reports hip pain as mild, increases with activity.  She is improving with ambulation.  Currently in recliner after ambluatory to bathroom, with increased pain. Otherwise she denies complaints.    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 Clemencia Course on 03/21/2023. Joint fluid culture growing rare Serratia Marcences, IV antibiotics de-escalated from cefepime and IV vancomycin to Rocephin on 03/23/2023, under the guidance of infectious disease. Continue as needed analgesics Appreciate infectious disease assistance. 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 Appreciate orthopedic surgery's assistance.  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   Curb sided with vascular surgery, Dr. Wyn Quaker, recommended to restart Eliquis and Plavix within 48 hours of I&D done on 03/21/2023.   Chronic diastolic CHF (congestive heart failure) (HCC) Euvolemic on exam. Resume torsemide (BP's now more elevated) Hold Entresto - resume when BP's will tolerate Continue strict I's and O's and daily weight   Atrial fibrillation, chronic (HCC) Chronic anticoagulation Currently rate controlled, on Eliquis for secondary CVA prevention. Metoprolol initially held, now resumed at lower dose 12.5 mg BID   CAD S/P  percutaneous coronary angioplasty Continue atorvastatin and Plavix   Anxiety and depression Continue citalopram and alprazolam   Primary hypertension Hold off home oral antihypertensives due to soft BPs Continue to closely monitor vital  signs.  CKD stage IIIa - renal function stable. Monitor BMP    DVT prophylaxis:  apixaban (ELIQUIS) tablet 5 mg     Code Status: Currently DNR per the patient herself.  Short-term intubation okay.  No CPR / chest compressions.  See code status order for partial code limitations.  Family Communication: family at bedside on rounds, updated  Disposition Plan: SNF    Consultants:  Orthopedics Curb sided with vascular surgery, Dr. Wyn Quaker. Infectious disease.    Antimicrobials:  IV cefepime and vancomycin, stopped on 03/23/2023 Rocephin started on 03/23/2023.     Objective: Vitals:   03/25/23 2332 03/26/23 0500 03/26/23 0757 03/26/23 1445  BP: (!) 133/47  (!) 143/54 (!) 130/38  Pulse: 69  77 63  Resp: 18  17 17   Temp: 98.1 F (36.7 C)  98 F (36.7 C) 98.1 F (36.7 C)  TempSrc:      SpO2: 99%  98% 100%  Weight:  93 kg    Height:        Intake/Output Summary (Last 24 hours) at 03/26/2023 1538 Last data filed at 03/26/2023 1239 Gross per 24 hour  Intake 119.1 ml  Output 2400 ml  Net -2280.9 ml   Filed Weights   03/23/23 0418 03/25/23 0456 03/26/23 0500  Weight: 92.9 kg 92.7 kg 93 kg    Examination:  General exam: awake, alert, no acute distress HEENT: moist mucus membranes, hearing grossly normal  Respiratory system: CTAB no wheezes, rales or rhonchi, normal respiratory effort. Cardiovascular system: normal S1/S2, RRR, no pedal edema.   Gastrointestinal system: soft, NT, ND, no HSM felt, +bowel sounds. Central nervous system: A&O x 4. no gross focal neurologic deficits, normal speech Extremities: moves all, no edema, normal tone Skin: dry, intact, normal temperature Psychiatry: normal mood, congruent affect, judgement and insight appear normal       Data Reviewed: I have personally reviewed following labs and imaging studies  CBC: Recent Labs  Lab 03/22/23 0519 03/23/23 0505 03/24/23 0034 03/24/23 0438 03/24/23 0842 03/25/23 0437 03/26/23 0530  WBC  9.3 15.9*  --  11.1*  --  9.5 10.0  HGB 9.2* 7.5* 8.2* 8.3* 8.8* 8.2* 8.3*  HCT 28.0* 22.5* 24.9* 24.5* 27.5* 25.3* 25.9*  MCV 91.8 91.8  --  90.7  --  91.7 94.5  PLT 254 251  --  244  --  285 290   Basic Metabolic Panel: Recent Labs  Lab 03/20/23 0605 03/21/23 0433 03/22/23 0519 03/23/23 0505 03/25/23 0437 03/26/23 0530 03/26/23 0531  NA 136 134* 136 135 137 135  --   K 3.6 3.5 4.1 4.7 4.2 3.6  --   CL 103 100 105 102 105 102  --   CO2 26 26 23 22 25 27   --   GLUCOSE 155* 111* 212* 167* 125* 151*  --   BUN 17 17 25* 34* 34* 39*  --   CREATININE 1.17* 1.17* 1.28* 1.43*  1.36* 1.16* 1.10*  --   CALCIUM 8.3* 8.3* 8.4* 8.6* 8.9 9.1  --   MG 1.4*  --   --  1.4* 1.4*  --  2.1  PHOS  --   --   --  3.2  --   --   --    GFR: Estimated Creatinine Clearance: 43.3 mL/min (  A) (by C-G formula based on SCr of 1.1 mg/dL (H)). Liver Function Tests: No results for input(s): "AST", "ALT", "ALKPHOS", "BILITOT", "PROT", "ALBUMIN" in the last 168 hours.  No results for input(s): "LIPASE", "AMYLASE" in the last 168 hours. No results for input(s): "AMMONIA" in the last 168 hours. Coagulation Profile: No results for input(s): "INR", "PROTIME" in the last 168 hours.  Sepsis Labs: No results for input(s): "PROCALCITON", "LATICACIDVEN" in the last 168 hours.   Recent Results (from the past 240 hour(s))  Blood Culture (routine x 2)     Status: None   Collection Time: 03/18/23  9:34 PM   Specimen: BLOOD RIGHT HAND  Result Value Ref Range Status   Specimen Description BLOOD RIGHT HAND  Final   Special Requests   Final    BOTTLES DRAWN AEROBIC ONLY Blood Culture adequate volume   Culture   Final    NO GROWTH 5 DAYS Performed at [facility], [address], [place], [place] [address]    Report Status 03/23/2023 FINAL  Final  Blood Culture (routine x 2)     Status: None   Collection Time: 03/18/23  9:36 PM   Specimen: BLOOD LEFT HAND  Result Value Ref Range Status   Specimen  Description BLOOD LEFT HAND  Final   Special Requests   Final    BOTTLES DRAWN AEROBIC AND ANAEROBIC Blood Culture adequate volume   Culture   Final    NO GROWTH 5 DAYS Performed at [facility], [address] [place], [place] [address]    Report Status 03/23/2023 FINAL  Final  Resp panel by RT-PCR (RSV, Flu A&B, Covid) Anterior Nasal Swab     Status: None   Collection Time: 03/18/23 10:28 PM   Specimen: Anterior Nasal Swab  Result Value Ref Range Status   SARS Coronavirus 2 by RT PCR NEGATIVE NEGATIVE Final    Comment: (NOTE) SARS-CoV-2 target nucleic acids are NOT DETECTED.  The SARS-CoV-2 RNA is generally detectable in upper respiratory specimens during the acute phase of infection. The lowest concentration of SARS-CoV-2 viral copies this assay can detect is 138 copies/mL. A negative result does not preclude SARS-Cov-2 infection and should not be used as the sole basis for treatment or other patient management decisions. A negative result may occur with  improper specimen collection/handling, submission of specimen other than nasopharyngeal swab, presence of viral mutation(s) within the areas targeted by this assay, and inadequate number of viral copies(<138 copies/mL). A negative result must be combined with clinical observations, patient history, and epidemiological information. The expected result is Negative.  Fact Sheet for Patients:  BloggerCourse.com  Fact Sheet for Healthcare Providers:  SeriousBroker.it  This test is no t yet approved or cleared by the [place] FDA and  has been authorized for detection and/or diagnosis of SARS-CoV-2 by FDA under an Emergency Use Authorization (EUA). This EUA will remain  in effect (meaning this test can be used) for the duration of the COVID-19 declaration under Section 564(b)(1) of the Act, 21 U.S.C.section 360bbb-3(b)(1), unless the authorization is terminated   or revoked sooner.       Influenza A by PCR NEGATIVE NEGATIVE Final   Influenza B by PCR NEGATIVE NEGATIVE Final    Comment: (NOTE) The Xpert Xpress SARS-CoV-2/FLU/RSV plus assay is intended as an aid in the diagnosis of influenza from Nasopharyngeal swab specimens and should not be used as a sole basis for treatment. Nasal washings and aspirates are unacceptable for Xpert Xpress SARS-CoV-2/FLU/RSV  testing.  Fact Sheet for Patients: BloggerCourse.com  Fact Sheet for Healthcare Providers: SeriousBroker.it  This test is not yet approved or cleared by the [place] FDA and has been authorized for detection and/or diagnosis of SARS-CoV-2 by FDA under an Emergency Use Authorization (EUA). This EUA will remain in effect (meaning this test can be used) for the duration of the COVID-19 declaration under Section 564(b)(1) of the Act, 21 U.S.C. section 360bbb-3(b)(1), unless the authorization is terminated or revoked.     Resp Syncytial Virus by PCR NEGATIVE NEGATIVE Final    Comment: (NOTE) Fact Sheet for Patients: BloggerCourse.com  Fact Sheet for Healthcare Providers: SeriousBroker.it  This test is not yet approved or cleared by the [place] FDA and has been authorized for detection and/or diagnosis of SARS-CoV-2 by FDA under an Emergency Use Authorization (EUA). This EUA will remain in effect (meaning this test can be used) for the duration of the COVID-19 declaration under Section 564(b)(1) of the Act, 21 U.S.C. section 360bbb-3(b)(1), unless the authorization is terminated or revoked.  Performed at [facility], [address] [place], [place] [address]   MRSA Next Gen by PCR, Nasal     Status: None   Collection Time: 03/19/23 11:02 AM   Specimen: Nasal Mucosa; Nasal Swab  Result Value Ref Range Status   MRSA by PCR Next Gen NOT DETECTED NOT DETECTED  Final    Comment: (NOTE) The GeneXpert MRSA Assay (FDA approved for NASAL specimens only), is one component of a comprehensive MRSA colonization surveillance program. It is not intended to diagnose MRSA infection nor to guide or monitor treatment for MRSA infections. Test performance is not FDA approved in patients less than 65 years old. Performed at [facility], [address], [place], [place] [address]   Aerobic Culture w Gram Stain (superficial specimen)     Status: None   Collection Time: 03/21/23 12:00 PM   Specimen: Wound  Result Value Ref Range Status   Specimen Description   Final    WOUND Performed at [facility], [address], [place], [place] [address]    Special Requests   Final    NONE Performed at [facility], [address] [place], [place] [address]    Gram Stain   Final    MODERATE WBC PRESENT,BOTH PMN AND MONONUCLEAR NO ORGANISMS SEEN    Culture   Final    RARE SERRATIA MARCESCENS SUSCEPTIBILITIES PERFORMED ON PREVIOUS CULTURE WITHIN THE LAST 5 DAYS. Performed at [facility], [address], [place], [place] [address]    Report Status 03/23/2023 FINAL  Final  Aerobic/Anaerobic Culture w Gram Stain (surgical/deep wound)     Status: None   Collection Time: 03/21/23 12:01 PM   Specimen: Wound; Body Fluid  Result Value Ref Range Status   Specimen Description   Final    WOUND Performed at [facility], [address], [place], [place] [address]    Special Requests   Final    NONE Performed at [facility], [address] [place], [place] [address]    Gram Stain   Final    FEW WBC PRESENT,BOTH PMN AND MONONUCLEAR NO ORGANISMS SEEN    Culture   Final    RARE SERRATIA MARCESCENS NO ANAEROBES ISOLATED Performed at [facility], [address] [place], [place] [address]    Report Status 03/26/2023 FINAL  Final   Organism ID, Bacteria SERRATIA MARCESCENS  Final  Susceptibility    Serratia marcescens - MIC*    CEFEPIME <=0.12 SENSITIVE Sensitive     CEFTAZIDIME <=1 SENSITIVE Sensitive     CEFTRIAXONE <=0.25 SENSITIVE Sensitive     CIPROFLOXACIN <=0.25 SENSITIVE Sensitive     GENTAMICIN <=1 SENSITIVE Sensitive     TRIMETH/SULFA <=20 SENSITIVE Sensitive     * RARE SERRATIA MARCESCENS  Aerobic Culture w Gram Stain (superficial specimen)     Status: None   Collection Time: 03/21/23 12:01 PM   Specimen: Wound  Result Value Ref Range Status   Specimen Description   Final    WOUND Performed at [facility], [address] [place], [place] [address]    Special Requests   Final    NONE Performed at [facility], [address] [place], [place] [address]    Gram Stain   Final    FEW WBC PRESENT,BOTH PMN AND MONONUCLEAR NO ORGANISMS SEEN Performed at [facility], [address] [place], [place] [address]    Culture RARE SERRATIA MARCESCENS  Final   Report Status 03/23/2023 FINAL  Final   Organism ID, Bacteria SERRATIA MARCESCENS  Final      Susceptibility   Serratia marcescens - MIC*    CEFEPIME <=0.12 SENSITIVE Sensitive     CEFTAZIDIME <=1 SENSITIVE Sensitive     CEFTRIAXONE <=0.25 SENSITIVE Sensitive     CIPROFLOXACIN <=0.25 SENSITIVE Sensitive     GENTAMICIN <=1 SENSITIVE Sensitive     TRIMETH/SULFA <=20 SENSITIVE Sensitive     * RARE SERRATIA MARCESCENS         Radiology Studies: No results found.      Scheduled Meds:  allopurinol  300 mg Oral Daily   apixaban  5 mg Oral BID   atorvastatin  80 mg Oral Daily   Chlorhexidine Gluconate Cloth  6 each Topical Daily   citalopram  20 mg Oral Daily   clopidogrel  75 mg Oral Daily   feeding supplement  237 mL Oral TID BM   metoprolol tartrate  12.5 mg Oral BID   multivitamin with minerals  1 tablet Oral Daily   polyethylene glycol  17 g Oral BID   senna  2 tablet Oral BID   sodium chloride flush  10-40 mL Intracatheter Q12H   torsemide  20 mg Oral Daily    Continuous Infusions:  cefTRIAXone (ROCEPHIN)  IV 2 g (03/26/23 0928)     LOS: 8 days    Time spent: 35 minutes    Pennie Banter, DO [facility] Pager [phone]  If 7PM-7AM, please contact night-coverage [email or web address]  03/26/2023, 3:38 PM

## 2023-03-26 NOTE — TOC Progression Note (Signed)
Transition of Care [facility]) - Progression Note    Patient Details  Name: Brooke Davis MRN: [id number] Date of Birth: 11-04-1940  Transition [facility]) CM/SW Contact  Garret Reddish, RN Phone Number: 03/26/2023, 8:46 AM Clinical Narrative:     Chart reviewed.  Noted that patient is stable for discharge.  SNF authorization remains pending at this time.  Pending Auth ID is [id number].  Patient will require IV antibiotics and Physical Therapy for SNF nursing services.    I have spoken to [place], Admission Coordinator with [employer] on yesterday.  She informs me that she will have a bed on today pending SNF authorization.    TOC will continue to follow for discharge planning.          Expected Discharge Plan and Services                                               Social Determinants of Health (SDOH) Interventions SDOH Screenings   Food Insecurity: No Food Insecurity (03/19/2023)  Housing: Low Risk  (03/19/2023)  Transportation Needs: No Transportation Needs (03/19/2023)  Utilities: Not At Risk (03/19/2023)  Financial Resource Strain: Patient Declined (11/05/2022)   Received from [name], [facility]  Tobacco Use: Medium Risk (03/18/2023)    Readmission Risk Interventions    03/01/2023   10:20 AM  Readmission Risk Prevention Plan  Transportation Screening Complete  PCP or Specialist Appt within 3-5 Days Complete  HRI or Home Care Consult Complete  Social Work Consult for Recovery Care Planning/Counseling Complete  Palliative Care Screening Not Applicable  Medication Review Oceanographer) Complete

## 2023-03-26 NOTE — TOC Progression Note (Signed)
Transition of Care [facility]) - Progression Note    Patient Details  Name: Brooke Davis MRN: [id number] Date of Birth: 1940/09/18  Transition of Care [facility]) CM/SW Contact  Garret Reddish, RN Phone Number: 03/26/2023, 3:58 PM  Clinical Narrative:   SNF authorization remains pending at this time.    TOC will continue to follow for discharge planning.           Expected Discharge Plan and Services                                               Social Determinants of Health (SDOH) Interventions SDOH Screenings   Food Insecurity: No Food Insecurity (03/19/2023)  Housing: Low Risk  (03/19/2023)  Transportation Needs: No Transportation Needs (03/19/2023)  Utilities: Not At Risk (03/19/2023)  Financial Resource Strain: Patient Declined (11/05/2022)   [facility], [facility]  Tobacco Use: Medium Risk (03/18/2023)    Readmission Risk Interventions    03/01/2023   10:20 AM  Readmission Risk Prevention Plan  Transportation Screening Complete  PCP or Specialist Appt within 3-5 Days Complete  HRI or Home Care Consult Complete  Social Work Consult for Recovery Care Planning/Counseling Complete  Palliative Care Screening Not Applicable  Medication Review Oceanographer) Complete

## 2023-03-26 NOTE — Progress Notes (Signed)
Occupational Therapy Treatment Patient Details Name: Brooke Davis MRN: [id number] DOB: Jul 24, 1940 Today's Date: 03/26/2023   History of present illness Pt is an 82 year old female s/p washout of R thigh collection with washout of total hip arthroplasty with poly exchange and head exchange; PMH significant for DM2, HTN, CAD s/p PCI and stent, dCHF A fib on Eliquis, hospitalized in July 2024 for elective hip replacement, again hospitalized in July 2024 with an acute embolic stroke while on Eliquis, with workup showing left carotid artery stenosis, s/p carotid stent placement on 02/25/23   OT comments  Pt up in chair upon OT arrival, reporting readiness to transfer from chair d/t sore buttocks and pain in R hip from sitting.  Nursing in during session to provide pain meds, and pt agreeable to repositioning by OT.  Pt tolerated functional mobility with RW around bed to perform grooming at sink.  Pt managed oral care with close supv, partial set up.  Pt with increased awareness to hip precautions this date, and did report she has been using her sockaid at home "for awhile."  OT donned socks today to maintain precautions.  Min A for bed mobility with sit to supine, and pt able to scoot toward [facility] with bridging technique, use of bilat bed rails, and set up from OT to adjust HOB.  Pt will continue to benefit from skilled OT in the acute setting to maximize safety and indep with daily tasks.  All necessary items within reach at end of session.       If plan is discharge home, recommend the following:  A little help with walking and/or transfers;A lot of help with bathing/dressing/bathroom;Assistance with cooking/housework;Assist for transportation;Help with stairs or ramp for entrance   Equipment Recommendations  None recommended by OT    Recommendations for Other Services      Precautions / Restrictions Precautions Precautions: Posterior Hip;Fall Precaution Booklet Issued: Yes  (comment) Restrictions Weight Bearing Restrictions: Yes RLE Weight Bearing: Weight bearing as tolerated LLE Weight Bearing: Weight bearing as tolerated Other Position/Activity Restrictions: RLE WBAT       Mobility Bed Mobility Overal bed mobility: Needs Assistance Bed Mobility: Sit to Supine       Sit to supine: Min assist   General bed mobility comments: increased time to perform Patient Response: Cooperative  Transfers Overall transfer level: Needs assistance Equipment used: Rolling walker (2 wheels) Transfers: Sit to/from Stand Sit to Stand: Contact guard assist                 Balance Overall balance assessment: Needs assistance Sitting-balance support: No upper extremity supported, Feet supported Sitting balance-Leahy Scale: Good Sitting balance - Comments: steady reaching within BOS   Standing balance support: Bilateral upper extremity supported, Reliant on assistive device for balance Standing balance-Leahy Scale: Good Standing balance comment: Able to stand at sink for grooming tasks, though pt did lean into forearms on sink countertop to maintain UE support throughout                           ADL either performed or assessed with clinical judgement   ADL Overall ADL's : Needs assistance/impaired     Grooming: Wash/dry hands;Oral care;Set up;Supervision/safety;Standing Grooming Details (indicate cue type and reason): stood at sink             Lower Body Dressing: Maximal assistance;Sit to/from stand Lower Body Dressing Details (indicate cue type and reason): OT donned socks for  maintenance of hip precautions; pt reports she has been using her sockaid at home for awhile             Functional mobility during ADLs: Contact guard assist;Rolling walker (2 wheels) General ADL Comments: Ambulatory in room with RW around bedside to perform grooming at bedside sink.    Extremity/Trunk Assessment Upper Extremity Assessment Upper  Extremity Assessment: Overall WFL for tasks assessed   Lower Extremity Assessment Lower Extremity Assessment: RLE deficits/detail RLE Deficits / Details: s/p R hip surgery        Vision Baseline Vision/History: 1 Wears glasses Patient Visual Report: No change from baseline                Cognition Arousal: Alert Behavior During Therapy: WFL for tasks assessed/performed Overall Cognitive Status: Within Functional Limits for tasks assessed Area of Impairment: Safety/judgement, Awareness                               General Comments: Pt is A and O x 4.        Exercises Other Exercises Other Exercises: Reviewed posterior hip precautions; pt able to recall 2/3 independently and maintained well during activity           General Comments      Pertinent Vitals/ Pain       Pain Assessment Pain Assessment: 0-10 Pain Score: 7  Pain Location: R hip Pain Descriptors / Indicators: Discomfort, Sore Pain Intervention(s): RN gave pain meds during session, Repositioned, Monitored during session, Limited activity within patient's tolerance  Home Living                                          Prior Functioning/Environment              Frequency  Min 1X/week        Progress Toward Goals  OT Goals(current goals can now be found in the care plan section)  Progress towards OT goals: Progressing toward goals  Acute Rehab OT Goals Patient Stated Goal: go home OT Goal Formulation: With patient Time For Goal Achievement: 04/06/23 Potential to Achieve Goals: Fair  Plan                       AM-PAC OT "6 Clicks" Daily Activity     Outcome Measure   Help from another person eating meals?: None Help from another person taking care of personal grooming?: A Little Help from another person toileting, which includes using toliet, bedpan, or urinal?: A Lot Help from another person bathing (including washing, rinsing, drying)?: A  Lot Help from another person to put on and taking off regular upper body clothing?: A Little Help from another person to put on and taking off regular lower body clothing?: A Lot 6 Click Score: 16    End of Session Equipment Utilized During Treatment: Rolling walker (2 wheels)  OT Visit Diagnosis: Other abnormalities of gait and mobility (R26.89)   Activity Tolerance Patient tolerated treatment well   Patient Left in bed;with call bell/phone within reach;with bed alarm set   Nurse Communication Mobility status        Time: 2725-3664 OT Time Calculation (min): 19 min  Charges: OT General Charges $OT Visit: 1 Visit OT Treatments $Self Care/Home Management : 8-22 mins  Brooke Earthly,  MS, OTR/L   Brooke Davis 03/26/2023, 12:07 PM

## 2023-03-26 NOTE — Plan of Care (Signed)
  Problem: Fluid Volume: Goal: Hemodynamic stability will improve Outcome: Progressing   Problem: Clinical Measurements: Goal: Diagnostic test results will improve Outcome: Progressing   Problem: Education: Goal: Knowledge of General Education information will improve Description: Including pain rating scale, medication(s)/side effects and non-pharmacologic comfort measures Outcome: Progressing   Problem: Health Behavior/Discharge Planning: Goal: Ability to manage health-related needs will improve Outcome: Progressing   Problem: Clinical Measurements: Goal: Diagnostic test results will improve Outcome: Progressing Goal: Cardiovascular complication will be avoided Outcome: Progressing   Problem: Activity: Goal: Risk for activity intolerance will decrease Outcome: Progressing   Problem: Nutrition: Goal: Adequate nutrition will be maintained Outcome: Progressing   Problem: Coping: Goal: Level of anxiety will decrease Outcome: Progressing   Problem: Elimination: Goal: Will not experience complications related to bowel motility Outcome: Progressing Goal: Will not experience complications related to urinary retention Outcome: Progressing   Problem: Pain Managment: Goal: General experience of comfort will improve Outcome: Progressing   Problem: Skin Integrity: Goal: Risk for impaired skin integrity will decrease Outcome: Progressing

## 2023-03-27 DIAGNOSIS — D62 Acute posthemorrhagic anemia: Secondary | ICD-10-CM | POA: Insufficient documentation

## 2023-03-27 DIAGNOSIS — Z7901 Long term (current) use of anticoagulants: Secondary | ICD-10-CM | POA: Diagnosis not present

## 2023-03-27 DIAGNOSIS — T8450XA Infection and inflammatory reaction due to unspecified internal joint prosthesis, initial encounter: Secondary | ICD-10-CM | POA: Diagnosis not present

## 2023-03-27 DIAGNOSIS — D509 Iron deficiency anemia, unspecified: Secondary | ICD-10-CM | POA: Diagnosis present

## 2023-03-27 DIAGNOSIS — Z96641 Presence of right artificial hip joint: Secondary | ICD-10-CM | POA: Diagnosis not present

## 2023-03-27 LAB — BASIC METABOLIC PANEL
Anion gap: 6 (ref 5–15)
BUN: 40 mg/dL — ABNORMAL HIGH (ref 8–23)
CO2: 29 mmol/L (ref 22–32)
Calcium: 8.6 mg/dL — ABNORMAL LOW (ref 8.9–10.3)
Chloride: 100 mmol/L (ref 98–111)
Creatinine, Ser: 1.14 mg/dL — ABNORMAL HIGH (ref 0.44–1.00)
GFR, Estimated: 49 mL/min — ABNORMAL LOW (ref >60)
Glucose, Bld: 162 mg/dL — ABNORMAL HIGH (ref 70–99)
Potassium: 3.3 mmol/L — ABNORMAL LOW (ref 3.5–5.1)
Sodium: 135 mmol/L (ref 135–145)

## 2023-03-27 LAB — IRON AND TIBC
Iron: 23 ug/dL — ABNORMAL LOW (ref 28–170)
Saturation Ratios: 8 % — ABNORMAL LOW (ref 10.4–31.8)
TIBC: 273 ug/dL (ref 250–450)
UIBC: 250 ug/dL

## 2023-03-27 LAB — FERRITIN: Ferritin: 150 ng/mL (ref 11–307)

## 2023-03-27 LAB — CBC
HCT: 21 % — ABNORMAL LOW (ref 36.0–46.0)
Hemoglobin: 7.1 g/dL — ABNORMAL LOW (ref 12.0–15.0)
MCH: 31.4 pg (ref 26.0–34.0)
MCHC: 33.8 g/dL (ref 30.0–36.0)
MCV: 92.9 fL (ref 80.0–100.0)
Platelets: 282 10*3/uL (ref 150–400)
RBC: 2.26 MIL/uL — ABNORMAL LOW (ref 3.87–5.11)
RDW: 16.6 % — ABNORMAL HIGH (ref 11.5–15.5)
WBC: 13.6 10*3/uL — ABNORMAL HIGH (ref 4.0–10.5)
nRBC: 0.1 % (ref 0.0–0.2)

## 2023-03-27 LAB — HEMOGLOBIN AND HEMATOCRIT, BLOOD
HCT: 22.8 % — ABNORMAL LOW (ref 36.0–46.0)
Hemoglobin: 7.4 g/dL — ABNORMAL LOW (ref 12.0–15.0)

## 2023-03-27 LAB — PREPARE RBC (CROSSMATCH)

## 2023-03-27 LAB — MAGNESIUM: Magnesium: 1.7 mg/dL (ref 1.7–2.4)

## 2023-03-27 MED ORDER — SODIUM CHLORIDE 0.9% IV SOLUTION: Freq: Once | INTRAVENOUS | Status: AC

## 2023-03-27 MED ORDER — MORPHINE SULFATE (PF) 2 MG/ML IV SOLN
2.0000 mg | INTRAVENOUS | Status: DC | PRN
  Administered 2023-03-27 – 2023-04-01 (×3): 2 mg via INTRAVENOUS
  Filled 2023-03-27 (×3): qty 1

## 2023-03-27 MED ORDER — POTASSIUM CHLORIDE CRYS ER 20 MEQ PO TBCR
40.0000 meq | EXTENDED_RELEASE_TABLET | Freq: Once | ORAL | Status: AC
  Administered 2023-03-27: 40 meq via ORAL
  Filled 2023-03-27: qty 2

## 2023-03-27 MED ORDER — ACETAMINOPHEN 500 MG PO TABS
1000.0000 mg | ORAL_TABLET | Freq: Four times a day (QID) | ORAL | Status: DC | PRN
  Administered 2023-03-27 (×2): 1000 mg via ORAL
  Filled 2023-03-27 (×3): qty 2

## 2023-03-27 NOTE — Progress Notes (Signed)
1      PROGRESS NOTE    Brooke Davis  LOV:[id number] DOB: 06/23/1941 DOA: 03/18/2023 PCP: [name], MD    Brief Narrative:   82 y.o. female with medical history significant for DM2, HTN, CAD s/p PCI and stent on Plavix, dCHF, chronic A fib on Eliquis, and chronic anxiety, hospitalized in July 2024 with an acute embolic stroke while on Eliquis, with workup showing left carotid artery stenosis s/p L carotid stent placement on 02/25/23 and undergoing total right hip replacement a couple weeks later on 03/09/23,  who presents to the ED with a 3-day history of fever, nausea, dizziness, diarrhea, R hip pain and swelling.    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  Status post right hip irrigation and debridement on 03/21/2023 by orthopedic surgery Dr. Clemencia Course.  Infectious disease consulted and following.  Joint fluid culture growing rare Serratia Marcences, IV antibiotics de-escalated from cefepime and IV vancomycin to Rocephin on 03/23/2023, under the guidance of infectious disease.  Further hospital course and management as outlined below.    03/27/23 - Pt seen with family this AM.  Family were concerned pt slept much of day yesterday.  Hip dressing noted this AM soaked in blood.  RN had pt yesterday and no bleeding, none seen at early AM shift today.  Pt feels tired, weak but otherwise denies complaints.  She is agreeable to blood transfusion.    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 Clemencia Course on 03/21/2023. Joint fluid culture growing rare Serratia Marcences, IV antibiotics de-escalated from cefepime and IV vancomycin to Rocephin on 03/23/2023, under the guidance of infectious disease. Continue as needed analgesics Appreciate infectious disease assistance. Will remain on IV Rocephin  until 10/14, OPAT orders placed Follow up with Infectious Disease in 4-6 weeks PICC line placement ordered Per Ortho: --WBAT on RLE --JP drain removed  --Follow up at [facility] in 2 weeks  Acute blood loss postsurgery on Eliquis and Plavix Hemoglobin 7.5 from 9.2, 1 unit PRBCs transfused Maintain hemoglobin above 8.0 9/7 -- Hbg dropped 8.3 >> 7.1 with bleeding from hip incision site --Transfuse 1 unit RBC's today --Repeat Hbg after transfusion --Repeat CBC in the morning --HOLD ELQUIS, continue Plavix given new carotid stent one month ago   Hx of total hip arthroplasty, 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 Appreciate orthopedic surgery's assistance.  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   Curb sided with vascular surgery, Dr. Wyn Quaker, recommended to restart Eliquis and Plavix within 48 hours of I&D done on 03/21/2023. HOLD ELIQUIS (9/7), resume when anemia improves and bleeding resolves   Chronic diastolic  CHF (congestive heart failure) (HCC) Euvolemic on exam. Resume torsemide (BP's now more elevated) Hold Entresto - resume when BP's will tolerate Continue strict I's and O's and daily weight   Atrial fibrillation, chronic (HCC) Chronic anticoagulation Currently rate controlled HOLD Eliquis  (9/7 - hip bleeding, Hbg dropped to 7.1) Metoprolol initially held, now resumed at lower dose 12.5 mg BID   CAD S/P percutaneous coronary angioplasty Continue atorvastatin and Plavix   Anxiety and depression Continue citalopram and alprazolam   Primary hypertension BP's overall stable with soft DBP's Resumed on home torsemide Continue to closely monitor vital signs.  CKD stage IIIa - renal function stable. Monitor BMP    DVT prophylaxis: on Plavix/Eliquis.  Eliquis held due to bleeding and progressive anemia requiring blood transfusion     Code Status: Currently DNR per the patient herself.  Short-term intubation okay.  No CPR / chest compressions.  See code status order for partial code limitations.  Family Communication: family at bedside on rounds, updated  Disposition Plan: SNF    Consultants:  Orthopedics Curb sided with vascular surgery, Dr. Wyn Quaker. Infectious disease.    Antimicrobials:  IV cefepime and vancomycin, stopped on 03/23/2023 Rocephin started on 03/23/2023.     Objective: Vitals:   03/26/23 1445 03/26/23 2212 03/26/23 2309 03/27/23 0832  BP: (!) 130/38 (!) 141/55 (!) 132/48 (!) 120/53  Pulse: 63 70 67 76  Resp: 17  18 16   Temp: 98.1 F (36.7 C) 98.6 F (37 C) 98.3 F (36.8 C) 97.8 F (36.6 C)  TempSrc:      SpO2: 100% 100% 100% 98%  Weight:      Height:        Intake/Output Summary (Last 24 hours) at 03/27/2023 1445 Last data filed at 03/27/2023 1037 Gross per 24 hour  Intake 120 ml  Output 1850 ml  Net -1730 ml   Filed Weights   03/23/23 0418 03/25/23 0456 03/26/23 0500  Weight: 92.9 kg 92.7 kg 93 kg    Examination:  General exam: awake, alert, no acute distress HEENT: pale conjunctivae, pale mucus membranes, hearing grossly normal  Respiratory system: CTAB, no wheezes, rales or rhonchi, normal respiratory effort. Cardiovascular system: normal S1/S2, RRR, no JVD, murmurs, rubs, gallops, no pedal edema.   Gastrointestinal system:  soft, NT, ND, no HSM felt, +bowel sounds. Central nervous system: A&O x 3. no gross focal neurologic deficits, normal speech Extremities: right hip surgical site appears healthy without active bleeding, some serous drainage noted, no surrounding erythema, indurated swelling around the site (photo below) Skin: dry, intact, normal temperature Psychiatry: normal mood, congruent affect, judgement and insight appear normal        Data Reviewed: I have personally reviewed following labs and imaging studies  CBC: Recent Labs  Lab 03/23/23 0505 03/24/23 0034 03/24/23 0438 03/24/23 0842 03/25/23 0437 03/26/23 0530 03/27/23 0615  WBC 15.9*  --  11.1*  --  9.5 10.0 13.6*  HGB 7.5*   < > 8.3* 8.8* 8.2* 8.3* 7.1*  HCT 22.5*   < > 24.5* 27.5* 25.3* 25.9* 21.0*  MCV 91.8  --  90.7  --  91.7 94.5 92.9  PLT 251  --  244  --  285 290 282   < > = values in this interval not displayed.   Basic Metabolic Panel: Recent Labs  Lab 03/22/23 0519 03/23/23 0505 03/25/23 0437 03/26/23 0530 03/26/23 0531 03/27/23 0615  NA 136 135 137 135  --  135  K 4.1 4.7 4.2 3.6  --  3.3*  CL 105 102 105 102  --  100  CO2 23 22 25 27   --  29  GLUCOSE 212* 167* 125* 151*  --  162*  BUN 25* 34* 34* 39*  --  40*  CREATININE 1.28* 1.43*  1.36* 1.16* 1.10*  --  1.14*  CALCIUM 8.4* 8.6* 8.9 9.1  --  8.6*  MG  --  1.4* 1.4*  --  2.1 1.7  PHOS  --  3.2  --   --   --   --    GFR: Estimated Creatinine Clearance: 41.8 mL/min (A) (by C-G formula based on SCr of 1.14 mg/dL (H)). Liver Function Tests: No results for input(s): "AST", "ALT", "ALKPHOS", "BILITOT", "PROT", "ALBUMIN" in the last 168 hours.  No results for input(s): "LIPASE", "AMYLASE" in the last 168 hours. No results for input(s): "AMMONIA" in the last 168 hours. Coagulation Profile: No results for input(s): "INR", "PROTIME" in the last 168 hours.  Sepsis Labs: No results for input(s): "PROCALCITON", "LATICACIDVEN" in the last 168  hours.   Recent Results (from the past 240 hour(s))  Blood Culture (routine x 2)     Status: None   Collection Time: 03/18/23  9:34 PM   Specimen: BLOOD RIGHT HAND  Result Value Ref Range Status   Specimen Description BLOOD RIGHT HAND  Final   Special Requests   Final    BOTTLES DRAWN AEROBIC ONLY Blood Culture adequate volume   Culture   Final    NO GROWTH 5 DAYS Performed at [facility], [address] [place], [place] [address]    Report Status 03/23/2023 FINAL  Final  Blood Culture (routine x 2)     Status: None   Collection Time: 03/18/23  9:36 PM   Specimen: BLOOD LEFT HAND  Result Value Ref Range Status   Specimen Description BLOOD LEFT HAND  Final   Special Requests   Final    BOTTLES DRAWN AEROBIC AND ANAEROBIC Blood Culture adequate volume   Culture   Final    NO GROWTH 5 DAYS Performed at [facility], [address] [place], [place] [address]    Report Status 03/23/2023 FINAL  Final  Resp panel by RT-PCR (RSV, Flu A&B, Covid) Anterior Nasal Swab     Status: None   Collection Time: 03/18/23 10:28 PM   Specimen: Anterior Nasal Swab  Result Value Ref Range Status   SARS Coronavirus 2 by RT PCR NEGATIVE NEGATIVE Final    Comment: (NOTE) SARS-CoV-2 target nucleic acids are NOT DETECTED.  The SARS-CoV-2 RNA is generally detectable in upper respiratory specimens during the acute phase of infection. The lowest concentration of SARS-CoV-2 viral copies this assay can detect is 138 copies/mL. A negative result does not preclude SARS-Cov-2 infection and should not be used as the sole basis for treatment or other patient management decisions. A negative result may occur with  improper specimen collection/handling, submission of specimen other than nasopharyngeal swab, presence of viral mutation(s) within the areas targeted by this assay, and inadequate number of viral copies(<138 copies/mL). A negative result must be combined with clinical  observations, patient history, and epidemiological information. The expected result is Negative.  Fact Sheet for Patients:  BloggerCourse.com  Fact Sheet for Healthcare Providers:  SeriousBroker.it  This test is no t yet approved or cleared by the [place] FDA and  has been authorized for detection and/or diagnosis of SARS-CoV-2 by FDA under an Emergency Use Authorization (EUA). This EUA will remain  in effect (meaning this test can be used) for the duration of the COVID-19 declaration under Section 564(b)(1) of the Act, 21 U.S.C.section 360bbb-3(b)(1), unless the authorization is terminated  or revoked sooner.       Influenza A by PCR NEGATIVE NEGATIVE Final   Influenza B by PCR NEGATIVE NEGATIVE Final    Comment: (NOTE) The Xpert Xpress SARS-CoV-2/FLU/RSV plus assay is intended as an aid in the diagnosis of influenza from Nasopharyngeal swab specimens and should not be used as a sole basis for treatment. Nasal washings and aspirates are unacceptable for Xpert Xpress SARS-CoV-2/FLU/RSV testing.  Fact Sheet for Patients: BloggerCourse.com  Fact Sheet for Healthcare Providers: SeriousBroker.it  This test is not yet approved or cleared by the [place] FDA and has been authorized for detection and/or diagnosis of SARS-CoV-2 by FDA under an Emergency Use Authorization (EUA). This EUA will remain in effect (meaning this test can be used) for the duration of the COVID-19 declaration under Section 564(b)(1) of the Act, 21 U.S.C. section 360bbb-3(b)(1), unless the authorization is terminated or revoked.     Resp Syncytial Virus by PCR NEGATIVE NEGATIVE Final    Comment: (NOTE) Fact Sheet for Patients: BloggerCourse.com  Fact Sheet for Healthcare Providers: SeriousBroker.it  This test is not yet approved or cleared by  the [place] FDA and has been authorized for detection and/or diagnosis of SARS-CoV-2 by FDA under an Emergency Use Authorization (EUA). This EUA will remain in effect (meaning this test can be used) for the duration of the COVID-19 declaration under Section 564(b)(1) of the Act, 21 U.S.C. section 360bbb-3(b)(1), unless the authorization is terminated or revoked.  Performed at [facility], [address] [place], [place] [address]   MRSA Next Gen by PCR, Nasal     Status: None   Collection Time: 03/19/23 11:02 AM   Specimen: Nasal Mucosa; Nasal Swab  Result Value Ref Range Status   MRSA by PCR Next Gen NOT DETECTED NOT DETECTED Final    Comment: (NOTE) The GeneXpert MRSA Assay (FDA approved for NASAL specimens only), is one component of a comprehensive MRSA colonization surveillance program. It is not intended to diagnose MRSA infection nor to guide or monitor treatment for MRSA infections. Test performance is not FDA approved in patients less than 31 years old. Performed at [facility], [address], [place], [place] [address]   Aerobic Culture w Gram Stain (superficial specimen)     Status: None   Collection Time: 03/21/23 12:00 PM   Specimen: Wound  Result Value Ref Range Status   Specimen Description   Final    WOUND Performed at [facility], [address], [place], [place] [address]    Special Requests   Final    NONE Performed at [facility], [address] [place], [place] [address]    Gram Stain   Final    MODERATE WBC PRESENT,BOTH PMN AND MONONUCLEAR NO ORGANISMS SEEN    Culture   Final    RARE SERRATIA MARCESCENS SUSCEPTIBILITIES PERFORMED ON PREVIOUS CULTURE WITHIN THE LAST 5 DAYS. Performed at [facility], [address] [place], [place] [address]    Report Status 03/23/2023 FINAL  Final  Aerobic/Anaerobic Culture w Gram Stain (surgical/deep wound)     Status: None   Collection Time: 03/21/23  12:01 PM   Specimen: Wound; Body Fluid  Result Value Ref Range Status   Specimen Description   Final    WOUND Performed at [facility], [address] [place], [place] [address]    Special Requests   Final    NONE Performed at [facility], [address] [place], [place] [address]    Gram Stain   Final    FEW WBC PRESENT,BOTH PMN AND MONONUCLEAR NO ORGANISMS SEEN    Culture   Final    RARE SERRATIA MARCESCENS NO ANAEROBES ISOLATED Performed at [facility], [address], [place], [place] [address]    Report Status 03/26/2023 FINAL  Final   Organism ID, Bacteria SERRATIA MARCESCENS  Final      Susceptibility   Serratia marcescens - MIC*    CEFEPIME <=0.12 SENSITIVE Sensitive     CEFTAZIDIME <=1 SENSITIVE Sensitive     CEFTRIAXONE <=0.25 SENSITIVE Sensitive     CIPROFLOXACIN <=0.25 SENSITIVE Sensitive     GENTAMICIN <=1 SENSITIVE Sensitive     TRIMETH/SULFA <=20 SENSITIVE Sensitive     * RARE SERRATIA MARCESCENS  Aerobic Culture w Gram Stain (superficial specimen)     Status: None   Collection Time: 03/21/23 12:01 PM   Specimen: Wound  Result Value Ref Range Status   Specimen Description   Final    WOUND Performed at [facility], [address] [place], [place] [address]    Special Requests   Final    NONE Performed at [facility], [address] [place], [place] [address]    Gram Stain   Final    FEW WBC PRESENT,BOTH PMN AND MONONUCLEAR NO ORGANISMS SEEN Performed at [facility], [address], [place], [place] [address]    Culture RARE SERRATIA MARCESCENS  Final   Report Status 03/23/2023 FINAL  Final   Organism ID, Bacteria SERRATIA MARCESCENS  Final      Susceptibility   Serratia marcescens - MIC*    CEFEPIME <=0.12 SENSITIVE Sensitive     CEFTAZIDIME <=1 SENSITIVE Sensitive     CEFTRIAXONE <=0.25 SENSITIVE Sensitive     CIPROFLOXACIN <=0.25 SENSITIVE Sensitive     GENTAMICIN <=1 SENSITIVE  Sensitive     TRIMETH/SULFA <=20 SENSITIVE Sensitive     * RARE SERRATIA MARCESCENS         Radiology Studies: No results found.      Scheduled Meds:  sodium chloride   Intravenous Once   allopurinol  300 mg Oral Daily   atorvastatin  80 mg Oral Daily   Chlorhexidine Gluconate Cloth  6 each Topical Daily   citalopram  20 mg Oral Daily   clopidogrel  75 mg Oral Daily   feeding supplement  237 mL Oral TID BM   metoprolol tartrate  12.5 mg Oral BID   multivitamin with minerals  1 tablet Oral Daily   polyethylene glycol  17 g Oral BID   senna  2 tablet Oral BID   sodium chloride flush  10-40 mL Intracatheter Q12H   torsemide  20 mg Oral Daily   Continuous Infusions:  cefTRIAXone (ROCEPHIN)  IV 2 g (03/27/23 0917)     LOS: 9 days    Time spent: 45 minutes    Pennie Banter, DO [facility] Pager [phone]  If 7PM-7AM, please contact night-coverage [email or web address]  03/27/2023, 2:45 PM

## 2023-03-27 NOTE — Progress Notes (Signed)
PT Cancellation Note  Patient Details Name: Brooke Davis MRN: [id number] DOB: 1941/05/01   Cancelled Treatment:    Reason Eval/Treat Not Completed: Pain limiting ability to participate.  Checked on pt for PT at 0853, pt reported being in severe pain and wanted to wait and get pain meds.  Returned at 1000, pt declined, just receiving a significant amount of pain meds and still in severe amount of pain.  RN present and advised on waiting this pm to follow up with PT and reported that pt's hemoglobin has dropped significantly.    Hortencia Conradi, PTA  03/27/23, 10:05 AM

## 2023-03-27 NOTE — TOC Progression Note (Addendum)
Transition of Care [facility]) - Progression Note    Patient Details  Name: Brooke Davis MRN: [id number] Date of Birth: 10-14-1940  [facility] CM/SW Contact  Kemper Durie, RN Phone Number: 03/27/2023, 3:08 PM  Clinical Narrative:     Auth approved through 9/10, Plan Berkley Harvey [id number], for [name], MD aware.  Plan for discharge on Monday.       Expected Discharge Plan and Services                                               Social Determinants of Health (SDOH) Interventions SDOH Screenings   Food Insecurity: No Food Insecurity (03/19/2023)  Housing: Low Risk  (03/19/2023)  Transportation Needs: No Transportation Needs (03/19/2023)  Utilities: Not At Risk (03/19/2023)  Financial Resource Strain: Patient Declined (11/05/2022)   Received from [facility]  Tobacco Use: Medium Risk (03/18/2023)    Readmission Risk Interventions    03/01/2023   10:20 AM  Readmission Risk Prevention Plan  Transportation Screening Complete  PCP or Specialist Appt within 3-5 Days Complete  HRI or Home Care Consult Complete  Social Work Consult for Recovery Care Planning/Counseling Complete  Palliative Care Screening Not Applicable  Medication Review Oceanographer) Complete

## 2023-03-27 NOTE — Plan of Care (Signed)

## 2023-03-27 NOTE — Progress Notes (Signed)
PT Cancellation Note  Patient Details Name: Brooke Davis MRN: [id number] DOB: 1941/01/14   Cancelled Treatment:    Reason Eval/Treat Not Completed: Medical issues which prohibited therapy Per Nursing, pt's hemoglobin is low and pt is waiting for transfusion.  Will follow up with pt for continued PT as pt is able.   Hortencia Conradi, PTA  03/27/23, 2:01 PM

## 2023-03-27 NOTE — Plan of Care (Signed)
  Problem: Education: Goal: Knowledge of General Education information will improve Description: Including pain rating scale, medication(s)/side effects and non-pharmacologic comfort measures Outcome: Progressing   Problem: Health Behavior/Discharge Planning: Goal: Ability to manage health-related needs will improve Outcome: Progressing   Problem: Clinical Measurements: Goal: Ability to maintain clinical measurements within normal limits will improve Outcome: Progressing Goal: Diagnostic test results will improve Outcome: Progressing   Problem: Activity: Goal: Risk for activity intolerance will decrease Outcome: Progressing   Problem: Nutrition: Goal: Adequate nutrition will be maintained Outcome: Progressing   Problem: Coping: Goal: Level of anxiety will decrease Outcome: Progressing   Problem: Elimination: Goal: Will not experience complications related to bowel motility Outcome: Progressing Goal: Will not experience complications related to urinary retention Outcome: Progressing   Problem: Pain Managment: Goal: General experience of comfort will improve Outcome: Progressing

## 2023-03-27 NOTE — Progress Notes (Signed)
Patient ID: Brooke Davis, female   DOB: 1940-09-09, 82 y.o.   MRN: [id number]  Subjective: The patient notes increased pain in her hip today, as well as increased drainage from her wound.  In addition, she is feeling somewhat weak, most likely due to her low hemoglobin and hematocrit.  She denies any fevers or chills.   Objective: Vital signs in last 24 hours: Temp:  [97.8 F (36.6 C)-98.6 F (37 C)] 97.8 F (36.6 C) (09/07 1516) Pulse Rate:  [67-76] 71 (09/07 1516) Resp:  [16-18] 17 (09/07 1516) BP: (92-141)/(40-55) 92/40 (09/07 1516) SpO2:  [96 %-100 %] 96 % (09/07 1516)  Intake/Output from previous day: 09/06 0701 - 09/07 0700 In: -  Out: 2550 [Urine:2550] Intake/Output this shift: Total I/O In: 120 [P.O.:120] Out: 500 [Urine:500]  Recent Labs    03/25/23 0437 03/26/23 0530 03/27/23 0615 03/27/23 1400  HGB 8.2* 8.3* 7.1* 7.4*   Recent Labs    03/26/23 0530 03/27/23 0615 03/27/23 1400  WBC 10.0 13.6*  --   RBC 2.74* 2.26*  --   HCT 25.9* 21.0* 22.8*  PLT 290 282  --    Recent Labs    03/26/23 0530 03/27/23 0615  NA 135 135  K 3.6 3.3*  CL 102 100  CO2 27 29  BUN 39* 40*  CREATININE 1.10* 1.14*  GLUCOSE 151* 162*  CALCIUM 9.1 8.6*   No results for input(s): "LABPT", "INR" in the last 72 hours.  Physical Exam: On examination, her surgical incision is notable for moderate serous drainage, while some bloody drainage is coming from the drain sites more distally.  There is mild-moderate swelling diffusely around the hip and thigh, but no obvious erythema or ecchymosis is noted.  She is grossly neurovascularly intact to the right lower extremity and foot.  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  Plan: The treatment options been discussed with the patient and her 2 daughters were at the bedside.  At this point, we will continue to observe her closely.   I agree with the plan to hold her Eliquis for a few days to minimize any bleeding and to transfuse her to improve her blood count.  In addition, I will obtain a wound VAC from the OR to apply over the wound in order to help mitigate the frequent dressing changes that have become necessary.  Today's blood work shows that her white blood cell count has bumped up to 13.6.  As she is on appropriate IV antibiotics and remains afebrile, I think we can continue to watch this.  However, if it continues to go up, she might need a repeat washout.   Excell Seltzer Hadasa Gasner 03/27/2023, 5:42 PM

## 2023-03-28 DIAGNOSIS — T8450XA Infection and inflammatory reaction due to unspecified internal joint prosthesis, initial encounter: Secondary | ICD-10-CM | POA: Diagnosis not present

## 2023-03-28 DIAGNOSIS — Z7901 Long term (current) use of anticoagulants: Secondary | ICD-10-CM | POA: Diagnosis not present

## 2023-03-28 DIAGNOSIS — Z96641 Presence of right artificial hip joint: Secondary | ICD-10-CM | POA: Diagnosis not present

## 2023-03-28 DIAGNOSIS — D62 Acute posthemorrhagic anemia: Secondary | ICD-10-CM | POA: Diagnosis not present

## 2023-03-28 LAB — CBC
HCT: 23.9 % — ABNORMAL LOW (ref 36.0–46.0)
Hemoglobin: 8.1 g/dL — ABNORMAL LOW (ref 12.0–15.0)
MCH: 32.5 pg (ref 26.0–34.0)
MCHC: 33.9 g/dL (ref 30.0–36.0)
MCV: 96 fL (ref 80.0–100.0)
Platelets: 285 10*3/uL (ref 150–400)
RBC: 2.49 MIL/uL — ABNORMAL LOW (ref 3.87–5.11)
RDW: 16.6 % — ABNORMAL HIGH (ref 11.5–15.5)
WBC: 13.9 10*3/uL — ABNORMAL HIGH (ref 4.0–10.5)
nRBC: 0.2 % (ref 0.0–0.2)

## 2023-03-28 LAB — MAGNESIUM: Magnesium: 1.5 mg/dL — ABNORMAL LOW (ref 1.7–2.4)

## 2023-03-28 LAB — BASIC METABOLIC PANEL
Anion gap: 9 (ref 5–15)
BUN: 43 mg/dL — ABNORMAL HIGH (ref 8–23)
CO2: 28 mmol/L (ref 22–32)
Calcium: 8.4 mg/dL — ABNORMAL LOW (ref 8.9–10.3)
Chloride: 98 mmol/L (ref 98–111)
Creatinine, Ser: 1.37 mg/dL — ABNORMAL HIGH (ref 0.44–1.00)
GFR, Estimated: 39 mL/min — ABNORMAL LOW (ref >60)
Glucose, Bld: 160 mg/dL — ABNORMAL HIGH (ref 70–99)
Potassium: 4.3 mmol/L (ref 3.5–5.1)
Sodium: 135 mmol/L (ref 135–145)

## 2023-03-28 MED ORDER — MAGNESIUM SULFATE 2 GM/50ML IV SOLN
2.0000 g | Freq: Once | INTRAVENOUS | Status: AC
  Administered 2023-03-28: 2 g via INTRAVENOUS
  Filled 2023-03-28: qty 50

## 2023-03-28 NOTE — Plan of Care (Signed)

## 2023-03-28 NOTE — Plan of Care (Signed)
  Problem: Fluid Volume: Goal: Hemodynamic stability will improve Outcome: Progressing   Problem: Clinical Measurements: Goal: Diagnostic test results will improve Outcome: Progressing   Problem: Health Behavior/Discharge Planning: Goal: Ability to manage health-related needs will improve Outcome: Progressing   Problem: Clinical Measurements: Goal: Diagnostic test results will improve Outcome: Progressing   Problem: Activity: Goal: Risk for activity intolerance will decrease Outcome: Progressing   Problem: Nutrition: Goal: Adequate nutrition will be maintained Outcome: Progressing   Problem: Pain Managment: Goal: General experience of comfort will improve Outcome: Progressing

## 2023-03-28 NOTE — Progress Notes (Addendum)
Subjective: 7 Days Post-Op Procedure(s) (LRB): RIGHT HIP IRRIGATION AND DEBRIDEMENT OF INFECTED TOTAL JOINT, EXCHANGE OF HEAD AND POLY COMPONENTS WITH PLACEMENT OF ANTIBIOTIC BEADS (Right) Patient reports pain as  mild to minimal while not moving.  States it is uncomfortable with PT, but improving .   Patient is well, and has had no acute complaints or problems.  Denies any chest pain, shortness of breath, nausea, vomiting, fevers or chills.  States that she has not been able to move her bowels yet.  Continue to monitor this. We will continue with therapy today.  She has done well so far with PT able to get out of bed and ambulate improving distances. Talked with PT and patient was able to get out to the nurses station today, still just requires some help getting out of bed. Seems to be improving day to day. Plan is to go Rehab after hospital stay.  Objective: Vital signs in last 24 hours: Temp:  [97.5 F (36.4 C)-99.1 F (37.3 C)] 99.1 F (37.3 C) (09/08 0838) Pulse Rate:  [69-79] 70 (09/08 0838) Resp:  [16-18] 16 (09/08 0838) BP: (92-132)/(40-50) 123/47 (09/08 0838) SpO2:  [96 %-100 %] 99 % (09/08 0838)  Intake/Output from previous day:  Intake/Output Summary (Last 24 hours) at 03/28/2023 1011 Last data filed at 03/28/2023 0604 Gross per 24 hour  Intake 566 ml  Output 750 ml  Net -184 ml    Intake/Output this shift: No intake/output data recorded.  Labs: Recent Labs    03/26/23 0530 03/27/23 0615 03/27/23 1400 03/28/23 0511  HGB 8.3* 7.1* 7.4* 8.1*   Recent Labs    03/27/23 0615 03/27/23 1400 03/28/23 0511  WBC 13.6*  --  13.9*  RBC 2.26*  --  2.49*  HCT 21.0* 22.8* 23.9*  PLT 282  --  285   Recent Labs    03/27/23 0615 03/28/23 0511  NA 135 135  K 3.3* 4.3  CL 100 98  CO2 29 28  BUN 40* 43*  CREATININE 1.14* 1.37*  GLUCOSE 162* 160*  CALCIUM 8.6* 8.4*   No results for input(s): "LABPT", "INR" in the last 72 hours.  EXAM General - Patient is Alert,  Appropriate, and Oriented.  Patient is hard of hearing, hearing aids Extremity - Neurologically intact Neurovascular intact Sensation intact distally Intact pulses distally Dorsiflexion/Plantar flexion intact No cellulitis present Compartment soft Dressing - some drainage over the distal aspect of the aquacel. A new bandage was applied over the drain site as there was some urine that had saturated the gauze. Area cleaned with alcohol prior to redressing. Motor Function - intact, moving foot and toes well on exam.  Patient able to plantar and dorsiflex with good strength and range of motion.  Patient has some difficulty performing SLR, but is able to 2 with mild assist from provider, does report some increased pain.  Patient is neurovascularly intact down her right lower extremity to all dermatomes, posterior tibial pulses appreciated 2+. Deep and superficial JP Drain site dry without any drainage appreciated over bandages.  Past Medical History:  Diagnosis Date   Anxiety    a.) on BZO (alprazolam) PRN   Arthritis    fingers, right hip   Atrial fibrillation (HCC) 11/10/2013   a.) CHA2DS2VASc = 5 (age x2, sex, HTN, T2DM);  b.) rate/rhythm maintained on oral metoprolol tartrate; chronically anticoagulated with apixaban   Bronchitis    Cerebrovascular small vessel disease (chronic)    Coronary artery disease involving native coronary artery of  native heart 01/11/2021   a.) LHC 01/11/2021: 40% oLM, 50% mLCx, 30% oLCx, 30% pLAD, 30% mLAD, 99% pRCA (2.75 x 30 mm Resolute Onyx DES), 75% p-mRCA, 60% dRCA   DDD (degenerative disc disease), lumbar 12/01/2013   Diastolic dysfunction 01/12/2021   a.) TTE 01/12/2021 (s/p inf STEMI): EF 55-60%, mild-mod LAE, mild RAE, mod RVE, mild AR/MR, mod TR, G1DD   Diet-controlled type 2 diabetes mellitus (HCC)    Full dentures    Gout    History of bilateral cataract extraction 2021   Hyperlipidemia    Hypertension, essential    Long term current use of  anticoagulant    a.) apixaban   PONV (postoperative nausea and vomiting)    Sinoatrial node dysfunction (HCC) 11/10/2013   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) 01/11/2021   a.) LHC/PCI 01/11/2021 --> culprit lesion 99% pRCA --> 2.75 x 30 mm Resolute Onyx DES x 1   Varicose veins of legs    Wears hearing aid in both ears     Assessment/Plan: 7 Days Post-Op Procedure(s) (LRB): RIGHT HIP IRRIGATION AND DEBRIDEMENT OF INFECTED TOTAL JOINT, EXCHANGE OF HEAD AND POLY COMPONENTS WITH PLACEMENT OF ANTIBIOTIC BEADS (Right) Principal Problem:   Sepsis (HCC) Active Problems:   Primary hypertension   Hx of total hip arthroplasty, 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   Weight as of this encounter: 93 kg.  Her white cell count and hemoglobin have improved from yesterday.  White cell count now at 10 from 9.5.  Hemoglobin seems to stabilized 8.2 yesterday and 8.3 today.  Will continue to trend these values.  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  Switched to ceftriaxone.  PICC in place, plan for 6 weeks abx per ID then transition to oral abx for at least 3 months.  Patient will continue to work with physical therapy to work on postoperative PT ambulation, gait, ROM and strengthening of her right lower extremity.  Hip Preacutions discussed  Discussed with the patient continuing to utilize ice over the  bandage  Woundvac applied with good suction this morning.    Vascular and hospitalist team has suggested the patient be placed on Plavix/Eliquis, continue on this regimen for DVT prophylaxis.  Weight-Bearing as tolerated to right leg  TOC has found placement at [facility]  Patient will follow-up with [facility] orthopedics in 2 weeks for reevaluation  Dedra Skeens, PA-C [facility] 03/28/2023, 10:11 AM   Addendum: Patient feeling better and a little more energetic this morning as compared to yesterday.  Wound VAC applied by Dedra Skeens, PA-C.  Continue IV antibiotics as recommended by infectious diseases.  Will continue to hold blood thinners for another 24 to 48 hours to try to minimize wound drainage.  May continue to mobilize with physical therapy, assuming patient stable from a medical standpoint.  Maryagnes Amos, MD [facility] 03/28/2023, 10:39 AM

## 2023-03-28 NOTE — Progress Notes (Addendum)
1      PROGRESS NOTE    Brooke Davis  ZOX:[id number] DOB: 1941/02/17 DOA: 03/18/2023 PCP: [name], MD    Brief Narrative:   82 y.o. female with medical history significant for DM2, HTN, CAD s/p PCI and stent on Plavix, dCHF, chronic A fib on Eliquis, and chronic anxiety, hospitalized in July 2024 with an acute embolic stroke while on Eliquis, with workup showing left carotid artery stenosis s/p L carotid stent placement on 02/25/23 and undergoing total right hip replacement a couple weeks later on 03/09/23,  who presents to the ED with a 3-day history of fever, nausea, dizziness, diarrhea, R hip pain and swelling.    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  Status post right hip irrigation and debridement on 03/21/2023 by orthopedic surgery Dr. Clemencia Course.  Infectious disease consulted and following.  Joint fluid culture growing rare Serratia Marcences, IV antibiotics de-escalated from cefepime and IV vancomycin to Rocephin on 03/23/2023, under the guidance of infectious disease.  Further hospital course and management as outlined below.    03/28/23 - Pt seen this AM with Ortho PA and RN at bedside, applying wound vac to hip.  Family seen waiting in hall and updated.  Pt reports feeling better today, no acute complaints.      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 Clemencia Course on 03/21/2023. Joint fluid culture growing rare Serratia Marcences, IV antibiotics de-escalated from cefepime and IV vancomycin to Rocephin on 03/23/2023, under the guidance of infectious disease. Continue as needed analgesics Appreciate infectious disease assistance. Will remain on IV Rocephin  until 10/14, OPAT orders placed Follow up with Infectious Disease in 4-6 weeks PICC line placed 9/4 Per Ortho: --WBAT on RLE --Hip precautions --Wound vac applied 9/8 --JP drain removed  --Follow up at [facility] in 2 weeks  Acute blood loss postsurgery on Eliquis and Plavix Hemoglobin 7.5 from 9.2, 1 unit PRBCs transfused Maintain hemoglobin above 8.0 9/7 -- Hbg dropped 8.3 >> 7.1 with bleeding from hip incision site. Ttransfused 1 unit RBC's 9/8 -- Hbg improved 7.4 >> 8.1. No bleeding. 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 Appreciate orthopedic surgery's assistance.  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   Curb sided with vascular surgery, Dr. Wyn Quaker, recommended to restart Eliquis and Plavix within 48 hours of I&D done on 03/21/2023. HOLD  ELIQUIS (9/7), resume when anemia improves and bleeding resolves   Chronic diastolic CHF (congestive heart failure) (HCC) Euvolemic on exam. Resume torsemide (BP's now more elevated) Hold Entresto - resume when BP's will tolerate Continue strict I's and O's and daily weight   Atrial fibrillation, chronic  (HCC) Chronic anticoagulation Currently rate controlled HOLD Eliquis (9/7 - hip bleeding, Hbg dropped to 7.1) Metoprolol initially held, now resumed at lower dose 12.5 mg BID   CAD S/P percutaneous coronary angioplasty Continue atorvastatin and Plavix   Anxiety and depression Continue citalopram and alprazolam   Primary hypertension BP's overall stable with soft DBP's Resumed on home torsemide Continue to closely monitor vital signs.  CKD stage IIIa - renal function stable. Monitor BMP    DVT prophylaxis: on Plavix /  Eliquis held due to bleeding and progressive anemia requiring blood transfusion 9/7     Code Status: Currently DNR per the patient herself.  Short-term intubation okay.  No CPR / chest compressions.  See code status order for partial code limitations.  Family Communication: family at bedside on rounds, updated  Disposition Plan: SNF    Consultants:  Orthopedics Curb sided with vascular surgery, Dr. Wyn Quaker. Infectious disease.    Antimicrobials:  IV cefepime and vancomycin, stopped on 03/23/2023 Rocephin started on 03/23/2023.     Objective: Vitals:   03/27/23 1747 03/27/23 2123 03/28/23 0142 03/28/23 0838  BP: (!) 95/50 (!) 124/50 (!) 132/44 (!) 123/47  Pulse: 76 79 79 70  Resp:  17 18 16   Temp: (!) 97.5 F (36.4 C) 98.5 F (36.9 C) 98.9 F (37.2 C) 99.1 F (37.3 C)  TempSrc: Oral     SpO2: 100% 100% 98% 99%  Weight:      Height:        Intake/Output Summary (Last 24 hours) at 03/28/2023 1358 Last data filed at 03/28/2023 1055 Gross per 24 hour  Intake 566 ml  Output 700 ml  Net -134 ml   Filed Weights   03/23/23 0418 03/25/23 0456 03/26/23 0500  Weight: 92.9 kg 92.7 kg 93 kg    Examination:  General exam: awake, alert, no acute distress HEENT: pale conjunctivae, pale mucus membranes, hearing grossly normal  Respiratory system: on room air, normal respiratory effort. Cardiovascular system: normal S1/S2, RRR, no pedal edema.    Gastrointestinal system: soft, NT, ND Central nervous system: A&O x 3. no gross focal neurologic deficits, normal speech Extremities: right hip as below, no active bleeding, wound vac in process of being applied during encounter this AM Skin: dry, intact, normal temperature Psychiatry: normal mood, congruent affect, judgement and insight appear normal   Photo taken 03/27/23     Data Reviewed: I have personally reviewed following labs and imaging studies  CBC: Recent Labs  Lab 03/24/23 0438 03/24/23 0842 03/25/23 0437 03/26/23 0530 03/27/23 0615 03/27/23 1400 03/28/23 0511  WBC 11.1*  --  9.5 10.0 13.6*  --  13.9*  HGB 8.3*   < > 8.2* 8.3* 7.1* 7.4* 8.1*  HCT 24.5*   < > 25.3* 25.9* 21.0* 22.8* 23.9*  MCV 90.7  --  91.7 94.5 92.9  --  96.0  PLT 244  --  285 290 282  --  285   < > = values in this interval not displayed.   Basic Metabolic Panel: Recent Labs  Lab 03/23/23 0505 03/25/23 0437 03/26/23 0530 03/26/23 0531 03/27/23 0615 03/28/23 0511  NA 135 137 135  --  135 135  K 4.7 4.2 3.6  --  3.3* 4.3  CL 102 105 102  --  100 98  CO2 22 25 27   --  29 28  GLUCOSE 167* 125* 151*  --  162* 160*  BUN 34* 34* 39*  --  40* 43*  CREATININE 1.43*  1.36* 1.16* 1.10*  --  1.14* 1.37*  CALCIUM 8.6* 8.9 9.1  --  8.6* 8.4*  MG 1.4* 1.4*  --  2.1 1.7 1.5*  PHOS 3.2  --   --   --   --   --    GFR: Estimated Creatinine Clearance: 34.8 mL/min (A) (by C-G formula based on SCr of 1.37 mg/dL (H)). Liver Function Tests: No results for input(s): "AST", "ALT", "ALKPHOS", "BILITOT", "PROT", "ALBUMIN" in the last 168 hours.  No results for input(s): "LIPASE", "AMYLASE" in the last 168 hours. No results for input(s): "AMMONIA" in the last 168 hours. Coagulation Profile: No results for input(s): "INR", "PROTIME" in the last 168 hours.  Sepsis Labs: No results for input(s): "PROCALCITON", "LATICACIDVEN" in the last 168 hours.   Recent Results (from the past 240 hour(s))  Blood  Culture (routine x 2)     Status: None   Collection Time: 03/18/23  9:34 PM   Specimen: BLOOD RIGHT HAND  Result Value Ref Range Status   Specimen Description BLOOD RIGHT HAND  Final   Special Requests   Final    BOTTLES DRAWN AEROBIC ONLY Blood Culture adequate volume   Culture   Final    NO GROWTH 5 DAYS Performed at [facility], [address], [place], [place] [address]    Report Status 03/23/2023 FINAL  Final  Blood Culture (routine x 2)     Status: None   Collection Time: 03/18/23  9:36 PM   Specimen: BLOOD LEFT HAND  Result Value Ref Range Status   Specimen Description BLOOD LEFT HAND  Final   Special Requests   Final    BOTTLES DRAWN AEROBIC AND ANAEROBIC Blood Culture adequate volume   Culture   Final    NO GROWTH 5 DAYS Performed at [facility], [address] [place], [place] [address]    Report Status 03/23/2023 FINAL  Final  Resp panel by RT-PCR (RSV, Flu A&B, Covid) Anterior Nasal Swab     Status: None   Collection Time: 03/18/23 10:28 PM   Specimen: Anterior Nasal Swab  Result Value Ref Range Status   SARS Coronavirus 2 by RT PCR NEGATIVE NEGATIVE Final    Comment: (NOTE) SARS-CoV-2 target nucleic acids are NOT DETECTED.  The SARS-CoV-2 RNA is generally detectable in upper respiratory specimens during the acute phase of infection. The lowest concentration of SARS-CoV-2 viral copies this assay can detect is 138 copies/mL. A negative result does not preclude SARS-Cov-2 infection and should not be used as the sole basis for treatment or other patient management decisions. A negative result may occur with  improper specimen collection/handling, submission of specimen other than nasopharyngeal swab, presence of viral mutation(s) within the areas targeted by this assay, and inadequate number of viral copies(<138 copies/mL). A negative result must be combined with clinical observations, patient history, and epidemiological information. The  expected result is Negative.  Fact Sheet for Patients:  BloggerCourse.com  Fact Sheet for Healthcare Providers:  SeriousBroker.it  This test is no t yet approved or cleared by the [place] FDA and  has been authorized for detection and/or diagnosis of SARS-CoV-2 by FDA under an Emergency Use Authorization (EUA). This EUA will remain  in  effect (meaning this test can be used) for the duration of the COVID-19 declaration under Section 564(b)(1) of the Act, 21 U.S.C.section 360bbb-3(b)(1), unless the authorization is terminated  or revoked sooner.       Influenza A by PCR NEGATIVE NEGATIVE Final   Influenza B by PCR NEGATIVE NEGATIVE Final    Comment: (NOTE) The Xpert Xpress SARS-CoV-2/FLU/RSV plus assay is intended as an aid in the diagnosis of influenza from Nasopharyngeal swab specimens and should not be used as a sole basis for treatment. Nasal washings and aspirates are unacceptable for Xpert Xpress SARS-CoV-2/FLU/RSV testing.  Fact Sheet for Patients: BloggerCourse.com  Fact Sheet for Healthcare Providers: SeriousBroker.it  This test is not yet approved or cleared by the [place] FDA and has been authorized for detection and/or diagnosis of SARS-CoV-2 by FDA under an Emergency Use Authorization (EUA). This EUA will remain in effect (meaning this test can be used) for the duration of the COVID-19 declaration under Section 564(b)(1) of the Act, 21 U.S.C. section 360bbb-3(b)(1), unless the authorization is terminated or revoked.     Resp Syncytial Virus by PCR NEGATIVE NEGATIVE Final    Comment: (NOTE) Fact Sheet for Patients: BloggerCourse.com  Fact Sheet for Healthcare Providers: SeriousBroker.it  This test is not yet approved or cleared by the [place] FDA and has been authorized for detection and/or  diagnosis of SARS-CoV-2 by FDA under an Emergency Use Authorization (EUA). This EUA will remain in effect (meaning this test can be used) for the duration of the COVID-19 declaration under Section 564(b)(1) of the Act, 21 U.S.C. section 360bbb-3(b)(1), unless the authorization is terminated or revoked.  Performed at [facility], [address] [place], [place] [address]   MRSA Next Gen by PCR, Nasal     Status: None   Collection Time: 03/19/23 11:02 AM   Specimen: Nasal Mucosa; Nasal Swab  Result Value Ref Range Status   MRSA by PCR Next Gen NOT DETECTED NOT DETECTED Final    Comment: (NOTE) The GeneXpert MRSA Assay (FDA approved for NASAL specimens only), is one component of a comprehensive MRSA colonization surveillance program. It is not intended to diagnose MRSA infection nor to guide or monitor treatment for MRSA infections. Test performance is not FDA approved in patients less than 74 years old. Performed at [facility], [address] [place], [place] [address]   Aerobic Culture w Gram Stain (superficial specimen)     Status: None   Collection Time: 03/21/23 12:00 PM   Specimen: Wound  Result Value Ref Range Status   Specimen Description   Final    WOUND Performed at [facility], [address] [place], [place] [address]    Special Requests   Final    NONE Performed at [facility], [address] [place], [place] [address]    Gram Stain   Final    MODERATE WBC PRESENT,BOTH PMN AND MONONUCLEAR NO ORGANISMS SEEN    Culture   Final    RARE SERRATIA MARCESCENS SUSCEPTIBILITIES PERFORMED ON PREVIOUS CULTURE WITHIN THE LAST 5 DAYS. Performed at [facility], [address], [place], [place] [address]    Report Status 03/23/2023 FINAL  Final  Aerobic/Anaerobic Culture w Gram Stain (surgical/deep wound)     Status: None   Collection Time: 03/21/23 12:01 PM   Specimen: Wound; Body Fluid  Result Value Ref Range Status    Specimen Description   Final    WOUND Performed at [facility],  [address] [place], [place] [address]    Special Requests   Final    NONE Performed at [facility], [address] [place], [place] [address]    Gram Stain   Final    FEW WBC PRESENT,BOTH PMN AND MONONUCLEAR NO ORGANISMS SEEN    Culture   Final    RARE SERRATIA MARCESCENS NO ANAEROBES ISOLATED Performed at [facility], [address] [place], [place] [address]    Report Status 03/26/2023 FINAL  Final   Organism ID, Bacteria SERRATIA MARCESCENS  Final      Susceptibility   Serratia marcescens - MIC*    CEFEPIME <=0.12 SENSITIVE Sensitive     CEFTAZIDIME <=1 SENSITIVE Sensitive     CEFTRIAXONE <=0.25 SENSITIVE Sensitive     CIPROFLOXACIN <=0.25 SENSITIVE Sensitive     GENTAMICIN <=1 SENSITIVE Sensitive     TRIMETH/SULFA <=20 SENSITIVE Sensitive     * RARE SERRATIA MARCESCENS  Aerobic Culture w Gram Stain (superficial specimen)     Status: None   Collection Time: 03/21/23 12:01 PM   Specimen: Wound  Result Value Ref Range Status   Specimen Description   Final    WOUND Performed at [facility], [address], [place], [place] [address]    Special Requests   Final    NONE Performed at [facility], [address] [place], [place] [address]    Gram Stain   Final    FEW WBC PRESENT,BOTH PMN AND MONONUCLEAR NO ORGANISMS SEEN Performed at [facility], [address], [place], [place] [address]    Culture RARE SERRATIA MARCESCENS  Final   Report Status 03/23/2023 FINAL  Final   Organism ID, Bacteria SERRATIA MARCESCENS  Final      Susceptibility   Serratia marcescens - MIC*    CEFEPIME <=0.12 SENSITIVE Sensitive     CEFTAZIDIME <=1 SENSITIVE Sensitive     CEFTRIAXONE <=0.25 SENSITIVE Sensitive     CIPROFLOXACIN <=0.25 SENSITIVE Sensitive     GENTAMICIN <=1 SENSITIVE Sensitive     TRIMETH/SULFA <=20 SENSITIVE Sensitive     * RARE SERRATIA  MARCESCENS         Radiology Studies: No results found.      Scheduled Meds:  allopurinol  300 mg Oral Daily   atorvastatin  80 mg Oral Daily   Chlorhexidine Gluconate Cloth  6 each Topical Daily   citalopram  20 mg Oral Daily   clopidogrel  75 mg Oral Daily   feeding supplement  237 mL Oral TID BM   metoprolol tartrate  12.5 mg Oral BID   multivitamin with minerals  1 tablet Oral Daily   polyethylene glycol  17 g Oral BID   senna  2 tablet Oral BID   sodium chloride flush  10-40 mL Intracatheter Q12H   torsemide  20 mg Oral Daily   Continuous Infusions:  cefTRIAXone (ROCEPHIN)  IV 2 g (03/28/23 1020)     LOS: 10 days    Time spent: 35 minutes    Pennie Banter, DO [facility] Pager [phone]  If 7PM-7AM, please contact night-coverage [email or web address]  03/28/2023, 1:59 PM

## 2023-03-28 NOTE — Progress Notes (Signed)
Physical Therapy Treatment Patient Details Name: Brooke Davis MRN: [id number] DOB: 02-04-1941 Today's Date: 03/28/2023   History of Present Illness Pt is an 82 year old female s/p washout of R thigh collection with washout of total hip arthroplasty with poly exchange and head exchange; PMH significant for DM2, HTN, CAD s/p PCI and stent, dCHF A fib on Eliquis, hospitalized in July 2024 for elective hip replacement, again hospitalized in July 2024 with an acute embolic stroke while on Eliquis, with workup showing left carotid artery stenosis, s/p carotid stent placement on 02/25/23    PT Comments  Pt in bed, ready to walk.  To EOB with min a x 1.  Stood and transferred to/from [facility] in attempt to void but did not.  She stated she felt too tired to walk this am and requested return to bed.  She does seem to struggle a bit more today with mobility and has one LOB to right while stepping needing assist to correct.  Fatigued quickly with tasks.  HR is noted to be 134 after transfer.  She does reports some SOB but stats 99% on room air.  Of note, pt c/o soreness in periarea and when purwic is removed it is painful and bright red bleeding noted.  She stated she feels "raw"  unable to observe as pt in [facility] but she does apply barrier cream and cream is applied to some areas of soreness on bottom but no open or red areas noted.  Pur wic is left off at this time with  personal brief donned per her preference.  She does c/o tenderness B heesl R>L.  No redness noted but lotion applied and heels floated.  RN in room at end of session and he is aware of issues and request for pain meds.     If plan is discharge home, recommend the following: A little help with walking and/or transfers;A little help with bathing/dressing/bathroom;Direct supervision/assist for medications management;Direct supervision/assist for financial management;Assist for transportation;Help with stairs or ramp for entrance;Assistance with  cooking/housework   Can travel by private vehicle        Equipment Recommendations       Recommendations for Other Services       Precautions / Restrictions Precautions Precautions: Posterior Hip;Fall Precaution Booklet Issued: Yes (comment) Restrictions Weight Bearing Restrictions: Yes RLE Weight Bearing: Weight bearing as tolerated LLE Weight Bearing: Weight bearing as tolerated     Mobility  Bed Mobility Overal bed mobility: Needs Assistance Bed Mobility: Sit to Supine, Supine to Sit     Supine to sit: Min assist Sit to supine: Min assist     Patient Response: Cooperative  Transfers Overall transfer level: Needs assistance Equipment used: Rolling walker (2 wheels) Transfers: Sit to/from Stand Sit to Stand: Min assist                Ambulation/Gait Ambulation/Gait assistance: Contact guard assist Gait Distance (Feet): 3 Feet Assistive device: Rolling walker (2 wheels) Gait Pattern/deviations: Step-to pattern Gait velocity: decreased     General Gait Details: few steps to chair, no true amb due to fatigue today, right lean/lob with steps today   Stairs             Wheelchair Mobility     Tilt Bed Tilt Bed Patient Response: Cooperative  Modified Rankin (Stroke Patients Only)       Balance Overall balance assessment: Needs assistance Sitting-balance support: No upper extremity supported, Feet supported Sitting balance-Leahy Scale: Good  Standing balance support: Bilateral upper extremity supported, Reliant on assistive device for balance Standing balance-Leahy Scale: Poor Standing balance comment: +1 assist today for balance and recovery of right LOB                            Cognition Arousal: Alert Behavior During Therapy: WFL for tasks assessed/performed Overall Cognitive Status: Within Functional Limits for tasks assessed                                          Exercises Other  Exercises Other Exercises: to [facility] to attempt void but unable    General Comments        Pertinent Vitals/Pain Pain Assessment Pain Assessment: Faces Faces Pain Scale: Hurts whole lot Pain Location: R hip Pain Descriptors / Indicators: Discomfort, Sore Pain Intervention(s): Monitored during session, Limited activity within patient's tolerance, Repositioned, Patient requesting pain meds-RN notified    Home Living                          Prior Function            PT Goals (current goals can now be found in the care plan section) Progress towards PT goals: Progressing toward goals    Frequency    BID      PT Plan      Co-evaluation              AM-PAC PT "6 Clicks" Mobility   Outcome Measure  Help needed turning from your back to your side while in a flat bed without using bedrails?: A Little Help needed moving from lying on your back to sitting on the side of a flat bed without using bedrails?: A Little Help needed moving to and from a bed to a chair (including a wheelchair)?: A Little Help needed standing up from a chair using your arms (e.g., wheelchair or bedside chair)?: A Little Help needed to walk in hospital room?: A Lot Help needed climbing 3-5 steps with a railing? : A Lot 6 Click Score: 16    End of Session Equipment Utilized During Treatment: Gait belt Activity Tolerance: Patient limited by fatigue;Patient limited by pain Patient left: in bed;with call bell/phone within reach;with bed alarm set;with family/visitor present Nurse Communication: Mobility status;Precautions;Weight bearing status PT Visit Diagnosis: Other abnormalities of gait and mobility (R26.89);Unsteadiness on feet (R26.81);Muscle weakness (generalized) (M62.81);Difficulty in walking, not elsewhere classified (R26.2);Pain Pain - Right/Left: Right Pain - part of body: Hip     Time: 0981-1914 PT Time Calculation (min) (ACUTE ONLY): 22 min  Charges:    $Therapeutic  Activity: 23-37 mins PT General Charges $$ ACUTE PT VISIT: 1 Visit                   Danielle Dess, PTA 03/28/23, 9:54 AM

## 2023-03-29 DIAGNOSIS — Z7901 Long term (current) use of anticoagulants: Secondary | ICD-10-CM | POA: Diagnosis not present

## 2023-03-29 DIAGNOSIS — Z7189 Other specified counseling: Secondary | ICD-10-CM | POA: Diagnosis not present

## 2023-03-29 DIAGNOSIS — Z96641 Presence of right artificial hip joint: Secondary | ICD-10-CM | POA: Diagnosis not present

## 2023-03-29 DIAGNOSIS — T8450XA Infection and inflammatory reaction due to unspecified internal joint prosthesis, initial encounter: Secondary | ICD-10-CM | POA: Diagnosis not present

## 2023-03-29 DIAGNOSIS — D62 Acute posthemorrhagic anemia: Secondary | ICD-10-CM | POA: Diagnosis not present

## 2023-03-29 LAB — BASIC METABOLIC PANEL
Anion gap: 8 (ref 5–15)
BUN: 48 mg/dL — ABNORMAL HIGH (ref 8–23)
CO2: 28 mmol/L (ref 22–32)
Calcium: 8.5 mg/dL — ABNORMAL LOW (ref 8.9–10.3)
Chloride: 98 mmol/L (ref 98–111)
Creatinine, Ser: 1.32 mg/dL — ABNORMAL HIGH (ref 0.44–1.00)
GFR, Estimated: 41 mL/min — ABNORMAL LOW (ref >60)
Glucose, Bld: 177 mg/dL — ABNORMAL HIGH (ref 70–99)
Potassium: 3.6 mmol/L (ref 3.5–5.1)
Sodium: 134 mmol/L — ABNORMAL LOW (ref 135–145)

## 2023-03-29 LAB — URINALYSIS, ROUTINE W REFLEX MICROSCOPIC
Bilirubin Urine: NEGATIVE
Glucose, UA: NEGATIVE mg/dL
Ketones, ur: NEGATIVE mg/dL
Leukocytes,Ua: NEGATIVE
Nitrite: NEGATIVE
Protein, ur: NEGATIVE mg/dL
Specific Gravity, Urine: 1.008 (ref 1.005–1.030)
pH: 5 (ref 5.0–8.0)

## 2023-03-29 LAB — CBC
HCT: 23.1 % — ABNORMAL LOW (ref 36.0–46.0)
Hemoglobin: 7.6 g/dL — ABNORMAL LOW (ref 12.0–15.0)
MCH: 31.8 pg (ref 26.0–34.0)
MCHC: 32.9 g/dL (ref 30.0–36.0)
MCV: 96.7 fL (ref 80.0–100.0)
Platelets: 278 10*3/uL (ref 150–400)
RBC: 2.39 MIL/uL — ABNORMAL LOW (ref 3.87–5.11)
RDW: 17.2 % — ABNORMAL HIGH (ref 11.5–15.5)
WBC: 16.1 10*3/uL — ABNORMAL HIGH (ref 4.0–10.5)
nRBC: 0 % (ref 0.0–0.2)

## 2023-03-29 LAB — MAGNESIUM: Magnesium: 1.9 mg/dL (ref 1.7–2.4)

## 2023-03-29 MED ORDER — OXYCODONE HCL 5 MG PO TABS
5.0000 mg | ORAL_TABLET | ORAL | Status: DC | PRN
  Administered 2023-03-29 – 2023-04-02 (×12): 5 mg via ORAL
  Filled 2023-03-29 (×11): qty 1

## 2023-03-29 MED ORDER — ZINC OXIDE 40 % EX OINT
TOPICAL_OINTMENT | Freq: Two times a day (BID) | CUTANEOUS | Status: DC
  Administered 2023-03-31: 1 via TOPICAL
  Filled 2023-03-29: qty 113

## 2023-03-29 MED ORDER — ACETAMINOPHEN 500 MG PO TABS
1000.0000 mg | ORAL_TABLET | Freq: Three times a day (TID) | ORAL | Status: DC
  Administered 2023-03-29 – 2023-04-02 (×12): 1000 mg via ORAL
  Filled 2023-03-29 (×13): qty 2

## 2023-03-29 NOTE — Progress Notes (Signed)
Daily Progress Note   Patient Name: Brooke Davis       Date: 03/29/2023 DOB: Jan 15, 1941  Age: 82 y.o. MRN#: [id number] Attending Physician: Pennie Banter, DO Primary Care Physician: [name], MD Admit Date: 03/18/2023  Reason for Consultation/Follow-up: Establishing goals of care  Subjective: Notes and labs reviewed.  In to see patient.  She is currently sitting in bed with her 2 daughters at bedside.  The daughters discussed planning and frustrations.  They discussed symptom management and that patient does not want to tell staff she has pain.  Discussed with patient and family the importance of pain management, the need for mobility, and the need to let staff know her pain level including functional pain level so that she can be treated adequately.  Attending team in to speak with patient.  Length of Stay: 11  Current Medications: Scheduled Meds:   acetaminophen  1,000 mg Oral Q8H   allopurinol  300 mg Oral Daily   atorvastatin  80 mg Oral Daily   Chlorhexidine Gluconate Cloth  6 each Topical Daily   citalopram  20 mg Oral Daily   clopidogrel  75 mg Oral Daily   feeding supplement  237 mL Oral TID BM   liver oil-zinc oxide   Topical BID   metoprolol tartrate  12.5 mg Oral BID   multivitamin with minerals  1 tablet Oral Daily   polyethylene glycol  17 g Oral BID   senna  2 tablet Oral BID   sodium chloride flush  10-40 mL Intracatheter Q12H   torsemide  20 mg Oral Daily    Continuous Infusions:  cefTRIAXone (ROCEPHIN)  IV 2 g (03/29/23 1020)    PRN Meds: ALPRAZolam, melatonin, morphine injection, ondansetron **OR** ondansetron (ZOFRAN) IV, oxyCODONE, sodium chloride flush  Physical Exam Pulmonary:     Effort: Pulmonary effort is normal.  Neurological:      Mental Status: She is alert.             Vital Signs: BP (!) 110/43 (BP Location: Left Arm)   Pulse 74   Temp 98.7 F (37.1 C)   Resp 18   Ht 5\' 2"  (1.575 m)   Wt 88.9 kg   SpO2 98%   BMI 35.85 kg/m  SpO2: SpO2: 98 % O2 Device: O2 Device: Room ONEOK  O2 Flow Rate:    Intake/output summary:  Intake/Output Summary (Last 24 hours) at 03/29/2023 1142 Last data filed at 03/29/2023 0523 Gross per 24 hour  Intake 100 ml  Output 1250 ml  Net -1150 ml   LBM: Last BM Date : 03/28/23 Baseline Weight: Weight: 81.2 kg Most recent weight: Weight: 88.9 kg    Patient Active Problem List   Diagnosis Date Noted   ABLA (acute blood loss anemia) 03/27/2023   Iron deficiency anemia 03/27/2023   Serratia infection 03/25/2023   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 02/2023 03/19/2023   CAD S/P percutaneous coronary angioplasty 03/19/2023   Generalized weakness 03/19/2023   Chronic anticoagulation 03/19/2023   Cellulitis of right hip 03/19/2023   Sepsis (HCC) 03/18/2023   Carotid stenosis, symptomatic, with infarction (HCC) 02/25/2023   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   Atrial fibrillation, chronic (HCC) 01/28/2023   Dyslipidemia 01/28/2023   Chronic diastolic CHF (congestive heart failure) (HCC) 01/28/2023   Gout 01/28/2023   Anxiety and depression 01/28/2023   Right arm weakness 01/28/2023   Acute CVA (cerebrovascular accident) (HCC) 01/28/2023   Bilateral carotid artery stenosis 01/28/2023   Hx of total hip arthroplasty, 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 01/18/2023   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   Slurred speech 09/23/2021   STEMI involving oth coronary artery of inferior wall (HCC) 01/11/2021   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) 01/11/2021   Controlled type 2 diabetes mellitus with hyperglycemia, without  long-term current use of insulin (HCC) 01/11/2021   Pure hypercholesterolemia 01/11/2021   Hypercalcemia 01/11/2021   DDD (degenerative disc disease), lumbar 12/01/2013   Lumbar radiculitis 12/01/2013   Atrial fibrillation, transient (HCC) 11/10/2013   Sinoatrial node dysfunction (HCC) 11/10/2013   Primary hypertension 11/10/2013   Poorly-controlled hypertension 11/10/2013    Palliative Care Assessment & Plan     Recommendations/Plan: Goals are set.  PMT will shadow for needs.  Code Status:    Code Status Orders  (From admission, onward)           Start     Ordered   03/22/23 1522  Do not attempt resuscitation (DNR) Pre-Arrest Interventions Desired  (Code Status)  Continuous       Question Answer Comment  If pulseless and not breathing No CPR or chest compressions.   In Pre-Arrest Conditions (Patient Has Pulse and Is Breathing) May intubate, use advanced airway interventions and cardioversion/ACLS medications if appropriate or indicated. May transfer to ICU.   Consent: Discussion documented in EHR or advanced directives reviewed      03/22/23 1521           Code Status History     Date Active Date Inactive Code Status Order ID Comments User Context   03/19/2023 0116 03/22/2023 1521 Full Code [id number]  Andris Baumann, MD ED   02/25/2023 1027 03/01/2023 2011 Full Code [id number]  Annice Needy, MD Inpatient   02/02/2023 1509 02/10/2023 1529 Full Code [id number]  Charlton Amor, PA-C Inpatient   02/02/2023 1509 02/02/2023 1509 Full Code [id number]  Charlton Amor, PA-C Inpatient   01/27/2023 2049 02/02/2023 1508 Full Code [id number]  Mansy, Vernetta Honey, MD ED   01/18/2023 1255 01/19/2023 1625 Full Code [id number]  Donato Heinz, MD Inpatient   01/12/2021 0013 01/13/2021 1754 Full Code [id number]  Marlow Baars, MD Inpatient   01/11/2021 1301  01/12/2021 0013 Full Code [id number]  Marcina Millard, MD Inpatient       Prognosis:  Unable to determine    Thank you for allowing  the Palliative Medicine Team to assist in the care of this patient.    Morton Stall, NP  Please contact Palliative Medicine Team phone at [phone] for questions and concerns.

## 2023-03-29 NOTE — Care Management Important Message (Signed)
Important Message  Patient Details  Name: Brooke Davis MRN: [id number] Date of Birth: 04-May-1941   Medicare Important Message Given:  Yes     Olegario Messier A Mory Herrman 03/29/2023, 2:37 PM

## 2023-03-29 NOTE — Progress Notes (Signed)
1      PROGRESS NOTE    Brooke Davis  AVW:[id number] DOB: 03-Nov-1940 DOA: 03/18/2023 PCP: [name], MD    Brief Narrative:   82 y.o. female with medical history significant for DM2, HTN, CAD s/p PCI and stent on Plavix, dCHF, chronic A fib on Eliquis, and chronic anxiety, hospitalized in July 2024 with an acute embolic stroke while on Eliquis, with workup showing left carotid artery stenosis s/p L carotid stent placement on 02/25/23 and undergoing total right hip replacement a couple weeks later on 03/09/23,  who presents to the ED with a 3-day history of fever, nausea, dizziness, diarrhea, R hip pain and swelling.    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  Status post right hip irrigation and debridement on 03/21/2023 by orthopedic surgery Dr. Clemencia Course.  Infectious disease consulted and following.  Joint fluid culture growing rare Serratia Marcences, IV antibiotics de-escalated from cefepime and IV vancomycin to Rocephin on 03/23/2023, under the guidance of infectious disease.  Further hospital course and management as outlined below.    03/29/23 - Pt seen this AM with daughters at bedside. She had a lot of uncontrolled pain after wound vac placed yesterday.  Better today so far.  Later was able to ambulate some with PT.  Pt reports sore / raw bottom from several loose stools, from stool softeners.  No fever/chills.    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 Clemencia Course on 03/21/2023. Joint fluid culture growing rare Serratia Marcences, IV antibiotics de-escalated from cefepime and IV vancomycin to Rocephin on 03/23/2023, under the guidance of infectious disease. Continue as needed analgesics Appreciate infectious disease assistance. Will remain on IV Rocephin  until 10/14, OPAT orders placed Follow up with Infectious Disease in 4-6 weeks PICC line placed 9/4 Per Ortho: --WBAT on RLE --Hip precautions --Wound vac applied 9/8 --JP drain removed  --Follow up at [facility] in 2 weeks  Acute blood loss postsurgery on Eliquis and Plavix Hemoglobin 7.5 from 9.2, 1 unit PRBCs transfused Maintain hemoglobin above 8.0 9/7 -- Hbg dropped 8.3 >> 7.1 with bleeding from hip incision site. Ttransfused 1 unit RBC's 9/8 -- Hbg improved 7.4 >> 8.1. No bleeding. 9/9 -- Hbg 7.6 no signs of bleeding --Repeat Hbg after transfusion --Repeat CBC in the morning --HOLDING ELQUIS, continue Plavix given new carotid stent one month ago --Resume Elquis as soon as possible  Leukocytosis -- WBC was normal 9.5 >> 10, then increased 9/7 and since 13.6 >> 13.9 >> 16.1 Suspect this is reactive due to the bleeding she had, then yesterday very uncontrolled pain and wound vac placement.  Pt has remained afebrile.   --Monitor closely --On IV antibiotics for PJI as outlined, per Infectious Disease --Ortho is following.   --Additional surgical washout could be required if signs of worsening infection or WBC  continues to rise.   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 Appreciate orthopedic surgery's assistance.  Hypomagnesemia - Mg 1.5 (9/8) - replaced & resolved. --Monitor Mg & replace PRN   Generalized weakness secondary to sepsis PT OT recommending SNF WBAT on RLE per Ortho  Continue fall precautions   History  of embolic stroke 01/2023 s/p L carotid stent placement 02/25/2023 on Eliquis Prior to admission on Eliquis, aspirin, and Plavix.   Curb sided with vascular surgery, Dr. Wyn Quaker, recommended to restart Eliquis and Plavix within 48 hours of I&D done on 03/21/2023. HOLD ELIQUIS (9/7), resume when anemia improves and bleeding resolves   Chronic diastolic CHF (congestive heart failure) (HCC) Euvolemic on exam. Continue torsemide  Hold Entresto - resume when BP's will tolerate (very soft diastolic BP's) Continue strict I's and O's and daily weight   Atrial fibrillation, chronic (HCC) Chronic anticoagulation Currently rate controlled HOLD Eliquis (9/7 - hip bleeding, Hbg dropped to 7.1) Metoprolol initially held, now resumed at lower dose 12.5 mg BID   CAD S/P percutaneous coronary angioplasty Continue atorvastatin and Plavix   Anxiety and depression Continue citalopram and alprazolam   Primary hypertension BP's overall stable with soft DBP's Resumed on home torsemide Continue to closely monitor vital signs.  CKD stage IIIa - renal function stable. Monitor BMP    DVT prophylaxis: on Plavix /  Eliquis held due to bleeding and progressive anemia requiring blood transfusion 9/7     Code Status: Currently DNR per the patient.  Short-term intubation okay.  No CPR / chest compressions.  See code status order for partial code limitations.  Family Communication: daughters at bedside on rounds, updated  Disposition Plan: SNF    Consultants:  Orthopedics Curb sided with vascular surgery, Dr. Wyn Quaker. Infectious disease.    Antimicrobials:  IV cefepime and vancomycin, stopped on 03/23/2023 Rocephin started on 03/23/2023.     Objective: Vitals:   03/28/23 2114 03/28/23 2345 03/29/23 0536 03/29/23 0848  BP: (!) 114/42 (!) 110/49  (!) 110/43  Pulse: 90 68  74  Resp: 16 16  18   Temp: 99 F (37.2 C) 98.5 F (36.9 C)  98.7 F (37.1 C)  TempSrc: Oral     SpO2: 94% 97%  98%  Weight:    88.9 kg   Height:        Intake/Output Summary (Last 24 hours) at 03/29/2023 1447 Last data filed at 03/29/2023 0523 Gross per 24 hour  Intake 100 ml  Output 1250 ml  Net -1150 ml   Filed Weights   03/25/23 0456 03/26/23 0500 03/29/23 0536  Weight: 92.7 kg 93 kg 88.9 kg    Examination:  General exam: awake, alert, no acute distress HEENT: clear conjunctivae, moist mucus membranes, hearing grossly normal  Respiratory system: on room air, normal respiratory effort. Lungs clear Cardiovascular system: normal S1/S2, RRR, no pedal edema.   Gastrointestinal system: soft, NT, ND Central nervous system: A&O x 3. no gross focal neurologic deficits, normal speech Extremities: wound vac in place to right hip with serous appearing fluid in canister Skin: dry, intact, normal temperature Psychiatry: normal mood, congruent affect, judgement and insight appear normal    Data Reviewed: I have personally reviewed following labs and imaging studies  CBC: Recent Labs  Lab 03/25/23 0437 03/26/23 0530 03/27/23 0615 03/27/23 1400 03/28/23 0511 03/29/23 1015  WBC 9.5 10.0 13.6*  --  13.9* 16.1*  HGB 8.2* 8.3* 7.1* 7.4* 8.1* 7.6*  HCT 25.3* 25.9* 21.0* 22.8* 23.9*  23.1*  MCV 91.7 94.5 92.9  --  96.0 96.7  PLT 285 290 282  --  285 278   Basic Metabolic Panel: Recent Labs  Lab 03/23/23 0505 03/25/23 0437 03/26/23 0530 03/26/23 0531 03/27/23 0615 03/28/23 0511 03/29/23 1015  NA 135 137 135  --  135 135 134*  K 4.7 4.2 3.6  --  3.3* 4.3 3.6  CL 102 105 102  --  100 98 98  CO2 22 25 27   --  29 28 28   GLUCOSE 167* 125* 151*  --  162* 160* 177*  BUN 34* 34* 39*  --  40* 43* 48*  CREATININE 1.43*  1.36* 1.16* 1.10*  --  1.14* 1.37* 1.32*  CALCIUM 8.6* 8.9 9.1  --  8.6* 8.4* 8.5*  MG 1.4* 1.4*  --  2.1 1.7 1.5* 1.9  PHOS 3.2  --   --   --   --   --   --    GFR: Estimated Creatinine Clearance: 35.2 mL/min (A) (by C-G formula based on SCr of 1.32 mg/dL (H)). Liver Function Tests: No  results for input(s): "AST", "ALT", "ALKPHOS", "BILITOT", "PROT", "ALBUMIN" in the last 168 hours.  No results for input(s): "LIPASE", "AMYLASE" in the last 168 hours. No results for input(s): "AMMONIA" in the last 168 hours. Coagulation Profile: No results for input(s): "INR", "PROTIME" in the last 168 hours.  Sepsis Labs: No results for input(s): "PROCALCITON", "LATICACIDVEN" in the last 168 hours.   Recent Results (from the past 240 hour(s))  Aerobic Culture w Gram Stain (superficial specimen)     Status: None   Collection Time: 03/21/23 12:00 PM   Specimen: Wound  Result Value Ref Range Status   Specimen Description   Final    WOUND Performed at [facility], [address] [place], [place] [address]    Special Requests   Final    NONE Performed at [facility], [address] [place], [place] [address]    Gram Stain   Final    MODERATE WBC PRESENT,BOTH PMN AND MONONUCLEAR NO ORGANISMS SEEN    Culture   Final    RARE SERRATIA MARCESCENS SUSCEPTIBILITIES PERFORMED ON PREVIOUS CULTURE WITHIN THE LAST 5 DAYS. Performed at [facility], [address] [place], [place] [address]    Report Status 03/23/2023 FINAL  Final  Aerobic/Anaerobic Culture w Gram Stain (surgical/deep wound)     Status: None   Collection Time: 03/21/23 12:01 PM   Specimen: Wound; Body Fluid  Result Value Ref Range Status   Specimen Description   Final    WOUND Performed at [facility], [address] [place], [place] [address]    Special Requests   Final    NONE Performed at [facility], [address] [place], [place] [address]    Gram Stain   Final    FEW WBC PRESENT,BOTH PMN AND MONONUCLEAR NO ORGANISMS SEEN    Culture   Final    RARE SERRATIA MARCESCENS NO ANAEROBES ISOLATED Performed at [facility], [address], [place], [place] [address]    Report Status 03/26/2023 FINAL  Final   Organism ID, Bacteria SERRATIA MARCESCENS   Final      Susceptibility   Serratia marcescens - MIC*    CEFEPIME <=0.12 SENSITIVE Sensitive     CEFTAZIDIME <=1 SENSITIVE Sensitive     CEFTRIAXONE <=0.25 SENSITIVE Sensitive     CIPROFLOXACIN <=0.25 SENSITIVE Sensitive  GENTAMICIN <=1 SENSITIVE Sensitive     TRIMETH/SULFA <=20 SENSITIVE Sensitive     * RARE SERRATIA MARCESCENS  Aerobic Culture w Gram Stain (superficial specimen)     Status: None   Collection Time: 03/21/23 12:01 PM   Specimen: Wound  Result Value Ref Range Status   Specimen Description   Final    WOUND Performed at [facility], [address] [place], [place] [address]    Special Requests   Final    NONE Performed at [facility], [address] [place], [place] [address]    Gram Stain   Final    FEW WBC PRESENT,BOTH PMN AND MONONUCLEAR NO ORGANISMS SEEN Performed at [facility], [address], [place], [place] [address]    Culture RARE SERRATIA MARCESCENS  Final   Report Status 03/23/2023 FINAL  Final   Organism ID, Bacteria SERRATIA MARCESCENS  Final      Susceptibility   Serratia marcescens - MIC*    CEFEPIME <=0.12 SENSITIVE Sensitive     CEFTAZIDIME <=1 SENSITIVE Sensitive     CEFTRIAXONE <=0.25 SENSITIVE Sensitive     CIPROFLOXACIN <=0.25 SENSITIVE Sensitive     GENTAMICIN <=1 SENSITIVE Sensitive     TRIMETH/SULFA <=20 SENSITIVE Sensitive     * RARE SERRATIA MARCESCENS         Radiology Studies: No results found.      Scheduled Meds:  acetaminophen  1,000 mg Oral Q8H   allopurinol  300 mg Oral Daily   atorvastatin  80 mg Oral Daily   Chlorhexidine Gluconate Cloth  6 each Topical Daily   citalopram  20 mg Oral Daily   clopidogrel  75 mg Oral Daily   feeding supplement  237 mL Oral TID BM   liver oil-zinc oxide   Topical BID   metoprolol tartrate  12.5 mg Oral BID   multivitamin with minerals  1 tablet Oral Daily   senna  2 tablet Oral BID   sodium chloride flush  10-40 mL Intracatheter Q12H    torsemide  20 mg Oral Daily   Continuous Infusions:  cefTRIAXone (ROCEPHIN)  IV 2 g (03/29/23 1020)     LOS: 11 days    Time spent: 38 minutes    Pennie Banter, DO [facility] Pager [phone]  If 7PM-7AM, please contact night-coverage [email or web address]  03/29/2023, 2:47 PM

## 2023-03-29 NOTE — Plan of Care (Signed)
  Problem: Pain Managment: Goal: General experience of comfort will improve Outcome: Progressing   Problem: Safety: Goal: Ability to remain free from injury will improve Outcome: Progressing   Problem: Skin Integrity: Goal: Risk for impaired skin integrity will decrease Outcome: Progressing   Problem: Activity: Goal: Ability to avoid complications of mobility impairment will improve Outcome: Progressing   Problem: Coping: Goal: Will verbalize positive feelings about self Outcome: Progressing

## 2023-03-29 NOTE — Progress Notes (Addendum)
Subjective: 8 Days Post-Op Procedure(s) (LRB): RIGHT HIP IRRIGATION AND DEBRIDEMENT OF INFECTED TOTAL JOINT, EXCHANGE OF HEAD AND POLY COMPONENTS WITH PLACEMENT OF ANTIBIOTIC BEADS (Right) Patient reports pain as increased the past few days. States the pain has been present over the weekend, and has been slowly improving, still reports a 7/10 pain. Patient states that her bottom has been hurting, notes that she has a sore developing on her bottom.  Denies any chest pain, shortness of breath, nausea, vomiting, fevers or chills.  States that she has been moving her bowels often, and would like to stop taking the MiraLAX. We will continue with therapy today.  She had some difficulty with out of bed ambulation over the weekend, but seems to have improved today. Plan is to go Rehab after hospital stay.  Objective: Vital signs in last 24 hours: Temp:  [98.4 F (36.9 C)-99 F (37.2 C)] 98.7 F (37.1 C) (09/09 0848) Pulse Rate:  [68-90] 74 (09/09 0848) Resp:  [16-18] 18 (09/09 0848) BP: (110-118)/(42-50) 110/43 (09/09 0848) SpO2:  [94 %-98 %] 98 % (09/09 0848) Weight:  [88.9 kg] 88.9 kg (09/09 0536)  Intake/Output from previous day:  Intake/Output Summary (Last 24 hours) at 03/29/2023 0916 Last data filed at 03/29/2023 0523 Gross per 24 hour  Intake 100 ml  Output 1350 ml  Net -1250 ml    Intake/Output this shift: No intake/output data recorded.  Labs: Recent Labs    03/27/23 0615 03/27/23 1400 03/28/23 0511  HGB 7.1* 7.4* 8.1*   Recent Labs    03/27/23 0615 03/27/23 1400 03/28/23 0511  WBC 13.6*  --  13.9*  RBC 2.26*  --  2.49*  HCT 21.0* 22.8* 23.9*  PLT 282  --  285   Recent Labs    03/27/23 0615 03/28/23 0511  NA 135 135  K 3.3* 4.3  CL 100 98  CO2 29 28  BUN 40* 43*  CREATININE 1.14* 1.37*  GLUCOSE 162* 160*  CALCIUM 8.6* 8.4*   No results for input(s): "LABPT", "INR" in the last 72 hours.  EXAM General - Patient is Alert, Appropriate, and Oriented.  Patient  is hard of hearing, hearing aids Extremity - Neurologically intact Neurovascular intact Sensation intact distally Intact pulses distally Dorsiflexion/Plantar flexion intact No cellulitis present Compartment soft Dressing -a wound VAC has been placed over the incision line which shows good drainage and appropriate seal.  No appreciable pus present. There is some mild redness appreciated at the front distal aspect of the incision, that is slightly uncomfortable to the touch.  Blanchable Motor Function - intact, moving foot and toes well on exam.  Patient able to plantar and dorsiflex with good strength and range of motion.  Patient has some difficulty performing SLR, but is able to 2 with mild assist from provider, does report some increased pain.  Patient is neurovascularly intact down her right lower extremity to all dermatomes, posterior tibial pulses appreciated 2+.  Patient noted to have periwick not in place, dried urine covering legs and pad. Patient was cleaned, a new pad and depend was placed. Noted to have skin breakdown in between legs and groin area. Discussed with nursing importance of continued changes an hygiene for this patient.  Past Medical History:  Diagnosis Date   Anxiety    a.) on BZO (alprazolam) PRN   Arthritis    fingers, right hip   Atrial fibrillation (HCC) 11/10/2013   a.) CHA2DS2VASc = 5 (age x2, sex, HTN, T2DM);  b.) rate/rhythm maintained  on oral metoprolol tartrate; chronically anticoagulated with apixaban   Bronchitis    Cerebrovascular small vessel disease (chronic)    Coronary artery disease involving native coronary artery of native heart 01/11/2021   a.) LHC 01/11/2021: 40% oLM, 50% mLCx, 30% oLCx, 30% pLAD, 30% mLAD, 99% pRCA (2.75 x 30 mm Resolute Onyx DES), 75% p-mRCA, 60% dRCA   DDD (degenerative disc disease), lumbar 12/01/2013   Diastolic dysfunction 01/12/2021   a.) TTE 01/12/2021 (s/p inf STEMI): EF 55-60%, mild-mod LAE, mild RAE, mod RVE, mild  AR/MR, mod TR, G1DD   Diet-controlled type 2 diabetes mellitus (HCC)    Full dentures    Gout    History of bilateral cataract extraction 2021   Hyperlipidemia    Hypertension, essential    Long term current use of anticoagulant    a.) apixaban   PONV (postoperative nausea and vomiting)    Sinoatrial node dysfunction (HCC) 11/10/2013   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) 01/11/2021   a.) LHC/PCI 01/11/2021 --> culprit lesion 99% pRCA --> 2.75 x 30 mm Resolute Onyx DES x 1   Varicose veins of legs    Wears hearing aid in both ears     Assessment/Plan: 8 Days Post-Op Procedure(s) (LRB): RIGHT HIP IRRIGATION AND DEBRIDEMENT OF INFECTED TOTAL JOINT, EXCHANGE OF HEAD AND POLY COMPONENTS WITH PLACEMENT OF ANTIBIOTIC BEADS (Right) Principal Problem:   Sepsis (HCC) Active Problems:   Primary hypertension   Hx of total hip arthroplasty, 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   Weight as of this encounter: 88.9 kg.  Her white cell count is continuing to trend up from the weekend from 13.6 on Saturday to be 16.1 today.  Patient was given 1 PRBC unit over the weekend due to decreasing hemoglobin values.  She has dropped again from 8.1 yesterday  to 7.6 today.  Continue to monitor these values.   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  Switched to ceftriaxone.  PICC in place, plan for 6 weeks abx per ID then  transition to oral abx for at least 3 months.  Miralax discontinued  Patient will continue to work with physical therapy to work on postoperative PT ambulation, gait, ROM and strengthening of her right lower extremity.  Hip Preacutions discussed  Discussed with the patient continuing to utilize ice over the bandage  Woundvac intact with good seal and suction.  Vascular and hospitalist team has suggested the patient be placed on Plavix/Eliquis.  Given patient has had increased discharge from her wound, Eliquis has been paused, will remain paused for the next 24 hours.  Continue on Plavix  Weight-Bearing as tolerated to right leg  TOC found [facility] to [facility], may continue to D/C if deemed medically stable.  Patient will follow-up with [facility] clinic orthopedics in 2 weeks from surgery for reevaluation  Danise Edge, PA-C [facility] 03/29/2023, 9:16 AM

## 2023-03-29 NOTE — Progress Notes (Signed)
Physical Therapy Treatment Patient Details Name: Brooke Davis MRN: [id number] DOB: [birth year] Today's Date: 03/29/2023   History of Present Illness Pt is an 82 year old female s/p washout of R thigh collection with washout of total hip arthroplasty with poly exchange and head exchange; PMH significant for DM2, HTN, CAD s/p PCI and stent, dCHF A fib on Eliquis, hospitalized in July 2024 for elective hip replacement, again hospitalized in July 2024 with an acute embolic stroke while on Eliquis, with workup showing left carotid artery stenosis, s/p carotid stent placement on 02/25/23    PT Comments  Pain meds received before session.  She is able to get to EOB with rail and increased time and min a for BLE's.  Steady in sitting.  Stands with min a x 1 to don brief and needs assist to pull up.  She is able to walk 29' then 25' with RW and min a x 1 for balance and +1 assist for IV/wound vac and recliner follow.  She does remain in chair after session with needs met.   If plan is discharge home, recommend the following: A little help with walking and/or transfers;A little help with bathing/dressing/bathroom;Direct supervision/assist for medications management;Direct supervision/assist for financial management;Assist for transportation;Help with stairs or ramp for entrance;Assistance with cooking/housework   Can travel by private vehicle        Equipment Recommendations       Recommendations for Other Services       Precautions / Restrictions Precautions Precautions: Posterior Hip;Fall Precaution Booklet Issued: Yes (comment) Restrictions Weight Bearing Restrictions: Yes RLE Weight Bearing: Weight bearing as tolerated LLE Weight Bearing: Weight bearing as tolerated     Mobility  Bed Mobility Overal bed mobility: Needs Assistance Bed Mobility: Supine to Sit     Supine to sit: Min assist          Transfers Overall transfer level: Needs assistance Equipment used: Rolling walker  (2 wheels) Transfers: Sit to/from Stand Sit to Stand: Min assist                Ambulation/Gait Ambulation/Gait assistance: Min Chemical engineer (Feet): 40 Feet Assistive device: Rolling walker (2 wheels) Gait Pattern/deviations: Step-to pattern Gait velocity: decreased     General Gait Details: 13' 25' limited by fatigue with seated rest in chair   Stairs             Wheelchair Mobility     Tilt Bed    Modified Rankin (Stroke Patients Only)       Balance Overall balance assessment: Needs assistance Sitting-balance support: No upper extremity supported, Feet supported Sitting balance-Leahy Scale: Good Sitting balance - Comments: steady reaching within BOS   Standing balance support: Bilateral upper extremity supported, Reliant on assistive device for balance Standing balance-Leahy Scale: Poor Standing balance comment: +1 assist today for balance                            Cognition Arousal: Alert Behavior During Therapy: WFL for tasks assessed/performed Overall Cognitive Status: Within Functional Limits for tasks assessed                                          Exercises      General Comments        Pertinent Vitals/Pain Pain Assessment Pain Assessment: Faces Faces Pain Scale:  Hurts little more Pain Location: R hip Pain Descriptors / Indicators: Discomfort, Sore Pain Intervention(s): Limited activity within patient's tolerance, Monitored during session, Premedicated before session    Home Living                          Prior Function            PT Goals (current goals can now be found in the care plan section) Progress towards PT goals: Progressing toward goals    Frequency    BID      PT Plan      Co-evaluation              AM-PAC PT "6 Clicks" Mobility   Outcome Measure  Help needed turning from your back to your side while in a flat bed without using bedrails?: A  Little Help needed moving from lying on your back to sitting on the side of a flat bed without using bedrails?: A Little Help needed moving to and from a bed to a chair (including a wheelchair)?: A Little Help needed standing up from a chair using your arms (e.g., wheelchair or bedside chair)?: A Little Help needed to walk in hospital room?: A Little Help needed climbing 3-5 steps with a railing? : A Lot 6 Click Score: 17    End of Session Equipment Utilized During Treatment: Gait belt Activity Tolerance: Patient limited by fatigue;Patient limited by pain Patient left: in bed;with call bell/phone within reach;with bed alarm set;with family/visitor present Nurse Communication: Mobility status;Precautions;Weight bearing status PT Visit Diagnosis: Other abnormalities of gait and mobility (R26.89);Unsteadiness on feet (R26.81);Muscle weakness (generalized) (M62.81);Difficulty in walking, not elsewhere classified (R26.2);Pain Pain - Right/Left: Right Pain - part of body: Hip     Time: 9147-8295 PT Time Calculation (min) (ACUTE ONLY): 21 min  Charges:    $Gait Training: 8-22 mins PT General Charges $$ ACUTE PT VISIT: 1 Visit                   Danielle Dess, PTA 03/29/23, 11:09 AM

## 2023-03-30 DIAGNOSIS — Z96641 Presence of right artificial hip joint: Secondary | ICD-10-CM | POA: Diagnosis not present

## 2023-03-30 DIAGNOSIS — D62 Acute posthemorrhagic anemia: Secondary | ICD-10-CM | POA: Diagnosis not present

## 2023-03-30 DIAGNOSIS — T8450XA Infection and inflammatory reaction due to unspecified internal joint prosthesis, initial encounter: Secondary | ICD-10-CM | POA: Diagnosis not present

## 2023-03-30 DIAGNOSIS — Z7901 Long term (current) use of anticoagulants: Secondary | ICD-10-CM | POA: Diagnosis not present

## 2023-03-30 DIAGNOSIS — Z7189 Other specified counseling: Secondary | ICD-10-CM | POA: Diagnosis not present

## 2023-03-30 LAB — HEMOGLOBIN: Hemoglobin: 8.2 g/dL — ABNORMAL LOW (ref 12.0–15.0)

## 2023-03-30 LAB — CBC
HCT: 21.7 % — ABNORMAL LOW (ref 36.0–46.0)
Hemoglobin: 7.3 g/dL — ABNORMAL LOW (ref 12.0–15.0)
MCH: 32 pg (ref 26.0–34.0)
MCHC: 33.6 g/dL (ref 30.0–36.0)
MCV: 95.2 fL (ref 80.0–100.0)
Platelets: 286 10*3/uL (ref 150–400)
RBC: 2.28 MIL/uL — ABNORMAL LOW (ref 3.87–5.11)
RDW: 17.3 % — ABNORMAL HIGH (ref 11.5–15.5)
WBC: 11 10*3/uL — ABNORMAL HIGH (ref 4.0–10.5)
nRBC: 0 % (ref 0.0–0.2)

## 2023-03-30 LAB — BASIC METABOLIC PANEL
Anion gap: 8 (ref 5–15)
BUN: 49 mg/dL — ABNORMAL HIGH (ref 8–23)
CO2: 29 mmol/L (ref 22–32)
Calcium: 8.6 mg/dL — ABNORMAL LOW (ref 8.9–10.3)
Chloride: 99 mmol/L (ref 98–111)
Creatinine, Ser: 1.34 mg/dL — ABNORMAL HIGH (ref 0.44–1.00)
GFR, Estimated: 40 mL/min — ABNORMAL LOW (ref >60)
Glucose, Bld: 148 mg/dL — ABNORMAL HIGH (ref 70–99)
Potassium: 3.3 mmol/L — ABNORMAL LOW (ref 3.5–5.1)
Sodium: 136 mmol/L (ref 135–145)

## 2023-03-30 LAB — PREPARE RBC (CROSSMATCH)

## 2023-03-30 MED ORDER — SODIUM CHLORIDE 0.9% IV SOLUTION: Freq: Once | INTRAVENOUS | Status: AC

## 2023-03-30 MED ORDER — POTASSIUM CHLORIDE CRYS ER 20 MEQ PO TBCR
40.0000 meq | EXTENDED_RELEASE_TABLET | Freq: Once | ORAL | Status: AC
  Administered 2023-03-30: 40 meq via ORAL
  Filled 2023-03-30: qty 2

## 2023-03-30 MED ORDER — SENNA 8.6 MG PO TABS: 2.0000 | ORAL_TABLET | Freq: Two times a day (BID) | ORAL | Status: DC | PRN

## 2023-03-30 NOTE — Plan of Care (Signed)
  Problem: Fluid Volume: Goal: Hemodynamic stability will improve Outcome: Progressing   Problem: Clinical Measurements: Goal: Diagnostic test results will improve Outcome: Progressing Goal: Signs and symptoms of infection will decrease Outcome: Progressing   Problem: Respiratory: Goal: Ability to maintain adequate ventilation will improve Outcome: Progressing   Problem: Education: Goal: Knowledge of General Education information will improve Description: Including pain rating scale, medication(s)/side effects and non-pharmacologic comfort measures Outcome: Progressing   Problem: Health Behavior/Discharge Planning: Goal: Ability to manage health-related needs will improve Outcome: Progressing   Problem: Clinical Measurements: Goal: Ability to maintain clinical measurements within normal limits will improve Outcome: Progressing Goal: Will remain free from infection Outcome: Progressing Goal: Diagnostic test results will improve Outcome: Progressing Goal: Respiratory complications will improve Outcome: Progressing Goal: Cardiovascular complication will be avoided Outcome: Progressing   Problem: Activity: Goal: Risk for activity intolerance will decrease Outcome: Progressing   Problem: Nutrition: Goal: Adequate nutrition will be maintained Outcome: Progressing   Problem: Coping: Goal: Level of anxiety will decrease Outcome: Progressing   Problem: Elimination: Goal: Will not experience complications related to bowel motility Outcome: Progressing Goal: Will not experience complications related to urinary retention Outcome: Progressing   Problem: Pain Managment: Goal: General experience of comfort will improve Outcome: Progressing   Problem: Safety: Goal: Ability to remain free from injury will improve Outcome: Progressing   Problem: Skin Integrity: Goal: Risk for impaired skin integrity will decrease Outcome: Progressing   Problem: Education: Goal:  Knowledge of the prescribed therapeutic regimen will improve Outcome: Progressing Goal: Understanding of discharge needs will improve Outcome: Progressing Goal: Individualized Educational Video(s) Outcome: Progressing   Problem: Activity: Goal: Ability to avoid complications of mobility impairment will improve Outcome: Progressing Goal: Ability to tolerate increased activity will improve Outcome: Progressing   Problem: Clinical Measurements: Goal: Postoperative complications will be avoided or minimized Outcome: Progressing   Problem: Pain Management: Goal: Pain level will decrease with appropriate interventions Outcome: Progressing   Problem: Skin Integrity: Goal: Will show signs of wound healing Outcome: Progressing   Problem: Education: Goal: Knowledge of disease or condition will improve Outcome: Progressing Goal: Knowledge of secondary prevention will improve (MUST DOCUMENT ALL) Outcome: Progressing Goal: Knowledge of patient specific risk factors will improve [name] N/A or DELETE if not current risk factor) Outcome: Progressing   Problem: Ischemic Stroke/TIA Tissue Perfusion: Goal: Complications of ischemic stroke/TIA will be minimized Outcome: Progressing   Problem: Coping: Goal: Will verbalize positive feelings about self Outcome: Progressing Goal: Will identify appropriate support needs Outcome: Progressing   Problem: Health Behavior/Discharge Planning: Goal: Ability to manage health-related needs will improve Outcome: Progressing Goal: Goals will be collaboratively established with patient/family Outcome: Progressing   Problem: Self-Care: Goal: Ability to participate in self-care as condition permits will improve Outcome: Progressing Goal: Verbalization of feelings and concerns over difficulty with self-care will improve Outcome: Progressing Goal: Ability to communicate needs accurately will improve Outcome: Progressing   Problem: Nutrition: Goal:  Risk of aspiration will decrease Outcome: Progressing Goal: Dietary intake will improve Outcome: Progressing

## 2023-03-30 NOTE — Progress Notes (Signed)
Nutrition Follow-up  DOCUMENTATION CODES:   Obesity unspecified  INTERVENTION:   -Liberalize diet to regular for widest variety of meal selections -Continue MVI with minerals daily -Continue Ensure Enlive po TID, each supplement provides 350 kcal and 20 grams of protein.   NUTRITION DIAGNOSIS:   Increased nutrient needs related to post-op healing as evidenced by estimated needs.  Ongoing  GOAL:   Patient will meet greater than or equal to 90% of their needs  Progressing   MONITOR:   PO intake, Supplement acceptance  REASON FOR ASSESSMENT:   Consult Assessment of nutrition requirement/status, Poor PO  ASSESSMENT:   Pt with medical history significant for DM, HTN, CAD s/p PCI and stent, dCHF, and anxiety. Pt with recent hospitalization for acute emolic stroke in 01/2023 and total rt hip replacement on 03/09/23. Pt presents with a 3-day PTA history of fever, nausea, dizziness and diarrhea.  9/1- Procedure(s): RIGHT HIP IRRIGATION AND DEBRIDEMENT OF INFECTED TOTAL JOINT, EXCHANGE OF HEAD AND POLY COMPONENTS WITH PLACEMENT OF ANTIBIOTIC BEADS (Right) 9/8- wound vac applied to lt hip  Reviewed I/O's: -840 ml x 24 hours and -6.5 L since admission  UOP: 850 ml x 24 hours  Pt unavailable at time of visit.   Intake has improved. Noted meal completions 50-100%. Pt is also drinking Ensure.   Per TOC notes, plan to d/c to SNF once medically stable.   Medications reviewed and include demadex.   Labs reviewed: K: 3.3, CBGS: 111 (inpatient orders for glycemic control are none).    Diet Order:   Diet Order             Diet Heart Room service appropriate? Yes; Fluid consistency: Thin  Diet effective now                   EDUCATION NEEDS:   Education needs have been addressed  Skin:  Skin Assessment: Skin Integrity Issues: Skin Integrity Issues:: Incisions, Other (Comment) Incisions: closed rt hip Other: non-pressure wound to sacrum, rash to umbilicus  Last BM:   03/19/23  Height:   Ht Readings from Last 1 Encounters:  03/19/23 5\' 2"  (1.575 m)    Weight:   Wt Readings from Last 1 Encounters:  03/30/23 87.2 kg    Ideal Body Weight:  50 kg  BMI:  Body mass index is 35.16 kg/m.  Estimated Nutritional Needs:   Kcal:  1500-1700  Protein:  75-90 grams  Fluid:  > 1.5 L    Levada Schilling, RD, LDN, CDCES Registered Dietitian II Certified Diabetes Care and Education Specialist Please refer to [facility] for RD and/or RD on-call/weekend/after hours pager

## 2023-03-30 NOTE — Progress Notes (Signed)
Daily Progress Note   Patient Name: Brooke Davis       Date: 03/30/2023 DOB: [birth year]  Age: 82 y.o. MRN#: [id number] Attending Physician: Pennie Banter, DO Primary Care Physician: [name], MD Admit Date: 03/18/2023  Reason for Consultation/Follow-up: Establishing goals of care  Subjective: Notes and labs reviewed.  In to see patient.  Patient's 2 daughters are at bedside.  They discussed conversations with orthopedics and concerns that patient will need to return to the OR for another washout.   Patient states she is feeling better than she has been.  She discusses that she wants to continue life-prolonging care as she has grandchildren and great-grandchildren that she wants to spend time with.  She confirms DNR status.  Discussed importance of continuing to communicate  her family know her wishes on care moving forward.  Discussed importance of good p.o. intake and protein.     Length of Stay: 12  Current Medications: Scheduled Meds:   acetaminophen  1,000 mg Oral Q8H   allopurinol  300 mg Oral Daily   atorvastatin  80 mg Oral Daily   Chlorhexidine Gluconate Cloth  6 each Topical Daily   citalopram  20 mg Oral Daily   clopidogrel  75 mg Oral Daily   feeding supplement  237 mL Oral TID BM   liver oil-zinc oxide   Topical BID   metoprolol tartrate  12.5 mg Oral BID   multivitamin with minerals  1 tablet Oral Daily   sodium chloride flush  10-40 mL Intracatheter Q12H   torsemide  20 mg Oral Daily    Continuous Infusions:  cefTRIAXone (ROCEPHIN)  IV 2 g (03/30/23 1025)    PRN Meds: ALPRAZolam, melatonin, morphine injection, ondansetron **OR** ondansetron (ZOFRAN) IV, oxyCODONE, senna, sodium chloride flush  Physical Exam Pulmonary:     Effort: Pulmonary effort  is normal.  Skin:    General: Skin is warm and dry.  Neurological:     Mental Status: She is alert.             Vital Signs: BP (!) 132/91 (BP Location: Left Arm)   Pulse 74   Temp 98.6 F (37 C) (Oral)   Resp 18   Ht 5\' 2"  (1.575 m)   Wt 87.2 kg   SpO2 99%   BMI 35.16 kg/m  SpO2: SpO2: 99 % O2 Device: O2 Device: Room Air O2 Flow Rate:    Intake/output summary:  Intake/Output Summary (Last 24 hours) at 03/30/2023 1517 Last data filed at 03/30/2023 1512 Gross per 24 hour  Intake 844 ml  Output 850 ml  Net -6 ml   LBM: Last BM Date : 03/30/23 Baseline Weight: Weight: 81.2 kg Most recent weight: Weight: 87.2 kg     Patient Active Problem List   Diagnosis Date Noted   ABLA (acute blood loss anemia) 03/27/2023   Iron deficiency anemia 03/27/2023   Serratia infection 03/25/2023   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 02/2023 03/19/2023   CAD S/P percutaneous coronary angioplasty 03/19/2023   Generalized weakness 03/19/2023   Chronic anticoagulation 03/19/2023   Cellulitis of right hip 03/19/2023   Sepsis (HCC) 03/18/2023   Carotid stenosis, symptomatic, with infarction (HCC) 02/25/2023   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   Atrial fibrillation, chronic (HCC) 01/28/2023   Dyslipidemia 01/28/2023   Chronic diastolic CHF (congestive heart failure) (HCC) 01/28/2023   Gout 01/28/2023   Anxiety and depression 01/28/2023   Right arm weakness 01/28/2023   Acute CVA (cerebrovascular accident) (HCC) 01/28/2023   Bilateral carotid artery stenosis 01/28/2023   Hx of total hip arthroplasty, 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 01/18/2023   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   Slurred speech 09/23/2021   STEMI involving oth coronary artery of inferior wall (HCC) 01/11/2021   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)  01/11/2021   Controlled type 2 diabetes mellitus with hyperglycemia, without long-term current use of insulin (HCC) 01/11/2021   Pure hypercholesterolemia 01/11/2021   Hypercalcemia 01/11/2021   DDD (degenerative disc disease), lumbar 12/01/2013   Lumbar radiculitis 12/01/2013   Atrial fibrillation, transient (HCC) 11/10/2013   Sinoatrial node dysfunction (HCC) 11/10/2013   Primary hypertension 11/10/2013   Poorly-controlled hypertension 11/10/2013    Palliative Care Assessment & Plan    Recommendations/Plan: DNR, ventilator up to 1 week Continue current care  Code Status:    Code Status Orders  (From admission, onward)           Start     Ordered   03/22/23 1522  Do not attempt resuscitation (DNR) Pre-Arrest Interventions Desired  (Code Status)  Continuous       Question Answer Comment  If pulseless and not breathing No CPR or chest compressions.   In Pre-Arrest Conditions (Patient Has Pulse and Is Breathing) May intubate, use advanced airway interventions and cardioversion/ACLS medications if appropriate or indicated. May transfer to ICU.   Consent: Discussion documented in EHR or advanced directives reviewed      03/22/23 1521           Code Status History     Date Active Date Inactive Code Status Order ID Comments User Context   03/19/2023 0116 03/22/2023 1521 Full Code [id number]  Andris Baumann, MD ED   02/25/2023 1027 03/01/2023 2011 Full Code [id number]  Annice Needy, MD Inpatient   02/02/2023 1509 02/10/2023 1529 Full Code [id number]  Charlton Amor, PA-C Inpatient   02/02/2023 1509 02/02/2023 1509 Full Code [id number]  Charlton Amor, PA-C Inpatient   01/27/2023 2049 02/02/2023 1508 Full Code [id number]  Mansy, Vernetta Honey, MD ED   01/18/2023 1255 01/19/2023 1625 Full Code [id number]  Donato Heinz, MD Inpatient   01/12/2021 0013 01/13/2021 1754 Full Code  [id number]  Marlow Baars, MD Inpatient   01/11/2021 1301 01/12/2021 0013 Full Code [id number]  Marcina Millard, MD  Inpatient     Thank you for allowing the Palliative Medicine Team to assist in the care of this patient.  Morton Stall, NP  Please contact Palliative Medicine Team phone at [phone] for questions and concerns.

## 2023-03-30 NOTE — Progress Notes (Addendum)
Subjective: 9 Days Post-Op Procedure(s) (LRB): RIGHT HIP IRRIGATION AND DEBRIDEMENT OF INFECTED TOTAL JOINT, EXCHANGE OF HEAD AND POLY COMPONENTS WITH PLACEMENT OF ANTIBIOTIC BEADS (Right) Patient reports that she is doing much better today. States that the pain she has been experiencing over the weekend has decreased, still reports a 3/10 pain. Patient states that her bottom is been doing better as well.  Nursing staff has been very attentive and good about routine changes and application of creams. Denies any chest pain, shortness of breath, nausea, vomiting, fevers or chills.  States she has been able to move her bowels since stopping the MiraLAX We will continue with therapy today.  Has been able to get up and work with PT with ambulating about 65 feet in the hallway. Plan is to go Rehab after hospital stay.  Objective: Vital signs in last 24 hours: Temp:  [98.2 F (36.8 C)-98.8 F (37.1 C)] 98.3 F (36.8 C) (09/10 1200) Pulse Rate:  [58-78] 74 (09/10 1200) Resp:  [14-20] 18 (09/10 1200) BP: (120-138)/(47-64) 120/59 (09/10 1200) SpO2:  [98 %-100 %] 100 % (09/10 1200) Weight:  [87.2 kg] 87.2 kg (09/10 0500)  Intake/Output from previous day:  Intake/Output Summary (Last 24 hours) at 03/30/2023 1219 Last data filed at 03/30/2023 0900 Gross per 24 hour  Intake 250 ml  Output 850 ml  Net -600 ml    Intake/Output this shift: Total I/O In: 240 [P.O.:240] Out: -   Labs: Recent Labs    03/27/23 1400 03/28/23 0511 03/29/23 1015 03/30/23 0447  HGB 7.4* 8.1* 7.6* 7.3*   Recent Labs    03/29/23 1015 03/30/23 0447  WBC 16.1* 11.0*  RBC 2.39* 2.28*  HCT 23.1* 21.7*  PLT 278 286   Recent Labs    03/29/23 1015 03/30/23 0447  NA 134* 136  K 3.6 3.3*  CL 98 99  CO2 28 29  BUN 48* 49*  CREATININE 1.32* 1.34*  GLUCOSE 177* 148*  CALCIUM 8.5* 8.6*   No results for input(s): "LABPT", "INR" in the last 72 hours.  EXAM General - Patient is Alert, Appropriate, and  Oriented.  Patient is hard of hearing, hearing aids Extremity - Neurologically intact Neurovascular intact Sensation intact distally Intact pulses distally Dorsiflexion/Plantar flexion intact No cellulitis present Compartment soft Dressing -a wound VAC has been placed over the incision line which shows good drainage and appropriate seal.  No appreciable pus present. There was some mild redness appreciated at the front distal aspect of the incision, that seems to be improved from yesterday, not as tender to touch.  Blanchable Motor Function - intact, moving foot and toes well on exam.  Patient able to plantar and dorsiflex with good strength and range of motion.  Patient has some difficulty performing SLR, but is able to with mild assist from provider, does report some increased pain.  Patient is neurovascularly intact down her right lower extremity to all dermatomes, posterior tibial pulses appreciated 2+.   Past Medical History:  Diagnosis Date   Anxiety    a.) on BZO (alprazolam) PRN   Arthritis    fingers, right hip   Atrial fibrillation (HCC) 11/10/2013   a.) CHA2DS2VASc = 5 (age x2, sex, HTN, T2DM);  b.) rate/rhythm maintained on oral metoprolol tartrate; chronically anticoagulated with apixaban   Bronchitis    Cerebrovascular small vessel disease (chronic)    Coronary artery disease involving native coronary artery of native heart 01/11/2021   a.) LHC 01/11/2021: 40% oLM, 50% mLCx, 30% oLCx, 30%  pLAD, 30% mLAD, 99% pRCA (2.75 x 30 mm Resolute Onyx DES), 75% p-mRCA, 60% dRCA   DDD (degenerative disc disease), lumbar 12/01/2013   Diastolic dysfunction 01/12/2021   a.) TTE 01/12/2021 (s/p inf STEMI): EF 55-60%, mild-mod LAE, mild RAE, mod RVE, mild AR/MR, mod TR, G1DD   Diet-controlled type 2 diabetes mellitus (HCC)    Full dentures    Gout    History of bilateral cataract extraction 2021   Hyperlipidemia    Hypertension, essential    Long term current use of anticoagulant     a.) apixaban   PONV (postoperative nausea and vomiting)    Sinoatrial node dysfunction (HCC) 11/10/2013   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) 01/11/2021   a.) LHC/PCI 01/11/2021 --> culprit lesion 99% pRCA --> 2.75 x 30 mm Resolute Onyx DES x 1   Varicose veins of legs    Wears hearing aid in both ears     Assessment/Plan: 9 Days Post-Op Procedure(s) (LRB): RIGHT HIP IRRIGATION AND DEBRIDEMENT OF INFECTED TOTAL JOINT, EXCHANGE OF HEAD AND POLY COMPONENTS WITH PLACEMENT OF ANTIBIOTIC BEADS (Right) Principal Problem:   Sepsis (HCC) Active Problems:   Primary hypertension   Hx of total hip arthroplasty, 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   Weight as of this encounter: 87.2 kg.  Her white cell count is now trending back down from her previous values.  Trended from 16.1 yesterday to 11.0 today.  H&H has continued to fall from 7.6 to 7.3 today, plan to get 1 more PRBC unit today due to decreasing hemoglobin values.    UA ordered yesterday, results show yellow, hazy and few bacteria  Continue with continued perineal care and cream application  ID has been consulted, found to have pansensitive Serratia macrcescens.  Switched to ceftriaxone.  PICC in place, plan for 6 weeks abx per ID then transition to oral abx for at least 3 months.  Patient will continue to work with physical therapy to work on postoperative PT ambulation, gait, ROM  and strengthening of her right lower extremity.  Hip Preacutions discussed  Discussed with the patient continuing to utilize ice over the bandage  Woundvac intact with good seal and suction.  Vascular and hospitalist team has suggested the patient be placed on Plavix/Eliquis.  Given patient has still had issues with dropping H&H, Eliquis still remains held.  Continue on Plavix  Weight-Bearing as tolerated to right leg  Hold off on TOC placement and discharge until deemed medically stable  Patient will follow-up with [facility] orthopedics in 2 weeks from surgery for reevaluation  Danise Edge, PA-C [facility] 03/30/2023, 12:19 PM

## 2023-03-30 NOTE — Progress Notes (Signed)
Physical Therapy Treatment Patient Details Name: Brooke Davis MRN: [id number] DOB: 07/25/1940 Today's Date: 03/30/2023   History of Present Illness Pt is an 82 year old female s/p washout of R thigh collection with washout of total hip arthroplasty with poly exchange and head exchange; PMH significant for DM2, HTN, CAD s/p PCI and stent, dCHF A fib on Eliquis, hospitalized in July 2024 for elective hip replacement, again hospitalized in July 2024 with an acute embolic stroke while on Eliquis, with workup showing left carotid artery stenosis, s/p carotid stent placement on 02/25/23    PT Comments  Pt calling for assist to get back to bed.  She is able to stand and transfer with cga x 1 and repositioned for comfort.  Deferred further gait due to active blood transfusion.   If plan is discharge home, recommend the following: A little help with walking and/or transfers;A little help with bathing/dressing/bathroom;Direct supervision/assist for medications management;Direct supervision/assist for financial management;Assist for transportation;Help with stairs or ramp for entrance;Assistance with cooking/housework   Can travel by private vehicle        Equipment Recommendations       Recommendations for Other Services       Precautions / Restrictions Precautions Precautions: Posterior Hip;Fall Precaution Booklet Issued: Yes (comment) Restrictions Weight Bearing Restrictions: Yes RLE Weight Bearing: Weight bearing as tolerated LLE Weight Bearing: Weight bearing as tolerated Other Position/Activity Restrictions: RLE WBAT     Mobility  Bed Mobility Overal bed mobility: Needs Assistance Bed Mobility: Sit to Supine       Sit to supine: Contact guard assist, Min assist   General bed mobility comments: pt sitting EOB with OT upon arrival Patient Response: Cooperative  Transfers Overall transfer level: Needs assistance Equipment used: Rolling walker (2 wheels) Transfers: Bed to  chair/wheelchair/BSC Sit to Stand: Contact guard assist   Step pivot transfers: Min assist       General transfer comment: Pt was able to stand from EOB with CGA for safety    Ambulation/Gait Ambulation/Gait assistance: Contact guard assist Gait Distance (Feet): 3 Feet Assistive device: Rolling walker (2 wheels) Gait Pattern/deviations: Step-to pattern Gait velocity: decreased     General Gait Details: irregular step pattern but progressing to more step-through gait   Stairs             Wheelchair Mobility     Tilt Bed Tilt Bed Patient Response: Cooperative  Modified Rankin (Stroke Patients Only)       Balance Overall balance assessment: Needs assistance Sitting-balance support: Feet supported Sitting balance-Leahy Scale: Good Sitting balance - Comments: steady reaching within BOS   Standing balance support: Bilateral upper extremity supported, Reliant on assistive device for balance Standing balance-Leahy Scale: Fair Standing balance comment: no LOB today with toileting or gait.                            Cognition Arousal: Alert Behavior During Therapy: WFL for tasks assessed/performed Overall Cognitive Status: Within Functional Limits for tasks assessed                                          Exercises Other Exercises Other Exercises: BSC for BM    General Comments        Pertinent Vitals/Pain Pain Assessment Pain Assessment: No/denies pain Pain Intervention(s): Premedicated before session    Home  Living                          Prior Function            PT Goals (current goals can now be found in the care plan section) Progress towards PT goals: Progressing toward goals    Frequency    BID      PT Plan      Co-evaluation              AM-PAC PT "6 Clicks" Mobility   Outcome Measure  Help needed turning from your back to your side while in a flat bed without using bedrails?:  A Little Help needed moving from lying on your back to sitting on the side of a flat bed without using bedrails?: A Little Help needed moving to and from a bed to a chair (including a wheelchair)?: A Little Help needed standing up from a chair using your arms (e.g., wheelchair or bedside chair)?: A Little Help needed to walk in hospital room?: A Little Help needed climbing 3-5 steps with a railing? : A Lot 6 Click Score: 17    End of Session Equipment Utilized During Treatment: Gait belt Activity Tolerance: Patient tolerated treatment well Patient left: in chair;with call bell/phone within reach;with chair alarm set;with nursing/sitter in room Nurse Communication: Mobility status;Precautions;Weight bearing status PT Visit Diagnosis: Other abnormalities of gait and mobility (R26.89);Unsteadiness on feet (R26.81);Muscle weakness (generalized) (M62.81);Difficulty in walking, not elsewhere classified (R26.2);Pain Pain - Right/Left: Right Pain - part of body: Hip     Time: 0981-1914 PT Time Calculation (min) (ACUTE ONLY): 14 min  Charges:    $Gait Training: 23-37 mins $Therapeutic Activity: 8-22 mins PT General Charges $$ ACUTE PT VISIT: 1 Visit                   Danielle Dess, PTA 03/30/23, 1:50 PM

## 2023-03-30 NOTE — TOC Progression Note (Signed)
Transition of Care [facility]) - Progression Note    Patient Details  Name: Brooke Davis MRN: [id number] Date of Birth: Nov 20, 1940  Transition of Care [facility]) CM/SW Contact  Marlowe Sax, RN Phone Number: 03/30/2023, 8:47 AM  Clinical Narrative:     Auth approved through 9/10, Plan Berkley Harvey [id number], for [employer] , Will need to be restarted, not medically ready to DC, Notified Tiffany at [employer]  Expected Discharge Plan: Skilled Nursing Facility Barriers to Discharge: English as a second language teacher, Continued Medical Work up  [facility]   Discharge Planning Services: CM Consult   Living arrangements for the past 2 months: Single Family Home                                       Social Determinants of Health (SDOH) Interventions SDOH Screenings   Food Insecurity: No Food Insecurity (03/19/2023)  Housing: Low Risk  (03/19/2023)  Transportation Needs: No Transportation Needs (03/19/2023)  Utilities: Not At Risk (03/19/2023)  Financial Resource Strain: Patient Declined (11/05/2022)   [facility] from [facility], [facility]  Tobacco Use: Medium Risk (03/18/2023)    Readmission Risk Interventions    03/01/2023   10:20 AM  Readmission Risk Prevention Plan  Transportation Screening Complete  PCP or Specialist Appt within 3-5 Days Complete  HRI or Home Care Consult Complete  Social Work Consult for Recovery Care Planning/Counseling Complete  Palliative Care Screening Not Applicable  Medication Review Ocean[employer]) Complete

## 2023-03-30 NOTE — Progress Notes (Addendum)
1      PROGRESS NOTE    Brooke Davis  ZOX:[id number] DOB: 06-13-1941 DOA: 03/18/2023 PCP: [name], MD    Brief Narrative:   82 y.o. female with medical history significant for DM2, HTN, CAD s/p PCI and stent on Plavix, dCHF, chronic A fib on Eliquis, and chronic anxiety, hospitalized in July 2024 with an acute embolic stroke while on Eliquis, with workup showing left carotid artery stenosis s/p L carotid stent placement on 02/25/23 and undergoing total right hip replacement a couple weeks later on 03/09/23,  who presents to the ED with a 3-day history of fever, nausea, dizziness, diarrhea, R hip pain and swelling.    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  Status post right hip irrigation and debridement on 03/21/2023 by orthopedic surgery Dr. Clemencia Course.  Infectious disease consulted and following.  Joint fluid culture growing rare Serratia Marcences, IV antibiotics de-escalated from cefepime and IV vancomycin to Rocephin on 03/23/2023, under the guidance of infectious disease.  Further hospital course and management as outlined below.    03/30/23 - Pt seen this AM with daughters at bedside. Pain much better controlled. She was able to ambulate into the hall with PT earlier this AM.  Painful bottom feels a bit better today.  No fever/chills or other acute complaints.  Feels better than past few days.    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 Clemencia Course on 03/21/2023. Joint fluid culture growing rare Serratia Marcences, IV antibiotics de-escalated from cefepime and IV vancomycin to Rocephin on 03/23/2023, under the guidance of infectious disease. Continue as needed analgesics Appreciate infectious disease assistance. Will remain on IV Rocephin  until 10/14, OPAT orders placed Follow up with Infectious Disease in 4-6 weeks PICC line placed 9/4 Per Ortho: --WBAT on RLE --Hip precautions --Wound vac applied 9/8 --JP drain removed  --Follow up at [facility] in 2 weeks  Acute blood loss postsurgery  Iron defiicency Hemoglobin 7.5 from 9.2, 1 unit PRBCs transfused Maintain hemoglobin above 8.0 9/7 -- Hbg dropped 8.3 >> 7.1 with bleeding from hip incision site. Transfused 1 unit RBC's 9/8 -- Hbg improved 7.4 >> 8.1. No bleeding. 9/9 -- Hbg 7.6 no signs of bleeding 9/10 -- Hbg 7.3. Transfuse 1 unit RBC's --Repeat Hbg after transfusion --Repeat CBC in the morning --HOLDING ELQUIS --Continue Plavix given new carotid stent one month ago --Defer iron infusion with active infection --Holding initiation of PO iron for now to avoid dark stools while we monitor for bleeding  Leukocytosis -- WBC was 9.5 >> 10, then increased 9/7 and trended up 13.6 >> 13.9 >> 16.1.  Improved WBC 11.0 today Suspect reactive due to the bleeding she had, then uncontrolled pain and wound vac placement.   Pt has remained afebrile.   --Monitor fever curve, CBC and clinically for s/sx's of  infection --On IV antibiotics for PJI as outlined, per Infectious Disease --Ortho is following.   --Additional surgical washout could be required if signs of worsening infection or WBC continues to rise.   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 Ortho continues to follow.  Hypokalemia - probably from several loose stools from  bowel regimen.   --replace PO Kcl for K 3.3 this AM --monitor BMP  Hypomagnesemia -replaced & resolved. 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   Curb sided with vascular surgery, Dr. Wyn Quaker, recommended to restart Eliquis and Plavix within 48 hours of I&D done on 03/21/2023, which was done. Pt developed bleeding from hip surgical site, required blood transfusions this week. --Continue with Plavix --HOLD ELIQUIS (9/7), resume when anemia improves and bleeding resolves --Reached out to update vascular team by chat 9/10 -- Recommending Plavix monotherapy for now, holding Eliquis unless Hbg improves and stabilizes, at least until she returns to clinic to discuss.    Chronic diastolic CHF (congestive heart failure) (HCC) Euvolemic on exam. Continue torsemide  Hold Entresto - resume when BP's will tolerate (very soft diastolic BP's) Continue strict I's and O's and daily weight   Atrial fibrillation, chronic (HCC) Chronic anticoagulation Currently rate controlled HOLD Eliquis (9/7 - hip bleeding, Hbg dropped to 7.1) Metoprolol initially held, now resumed at lower dose 12.5 mg BID   CAD S/P percutaneous coronary angioplasty Continue atorvastatin and Plavix   Anxiety and depression Continue citalopram and alprazolam   Primary hypertension BP's overall stable with soft DBP's On metoprolol, reduced dose Resumed on home torsemide Continue to closely monitor vital signs.  CKD stage IIIa - renal function stable. Monitor BMP    DVT prophylaxis: on Plavix /  Eliquis held due to bleeding and progressive anemia requiring blood transfusion 9/7     Code Status: Currently DNR per the patient.  Short-term intubation okay.  No CPR / chest compressions.   See code status order for partial code limitations.  Family Communication: daughters at bedside on rounds, updated  Disposition Plan: SNF    Consultants:  Orthopedics Curb sided with vascular surgery, Dr. Wyn Quaker. Infectious disease.    Antimicrobials:  IV cefepime and vancomycin, stopped on 03/23/2023 Rocephin started on 03/23/2023.     Objective: Vitals:   03/30/23 1200 03/30/23 1230 03/30/23 1519 03/30/23 1549  BP: (!) 120/59 (!) 132/91 135/74 132/62  Pulse: 74 74 66 76  Resp: 18 18 16 14   Temp: 98.3 F (36.8 C) 98.6 F (37 C) 98.1 F (36.7 C) 98.4 F (36.9 C)  TempSrc: Oral Oral Oral   SpO2: 100% 99% 100% 98%  Weight:      Height:        Intake/Output Summary (Last 24 hours) at 03/30/2023 1622 Last data filed at 03/30/2023 1512 Gross per 24 hour  Intake 844 ml  Output 850 ml  Net -6 ml   Filed Weights   03/26/23 0500 03/29/23 0536 03/30/23 0500  Weight: 93 kg 88.9 kg 87.2 kg    Examination:  General exam: awake, alert, no acute distress HEENT: clear conjunctivae, moist mucus membranes, hearing grossly normal, small pupils Respiratory system: CTAB no wheezes, on room air, normal respiratory effort. Lungs clear Cardiovascular system: normal S1/S2, RRR Gastrointestinal system: soft,  NT, ND Central nervous system: A&O x 3. no gross focal neurologic deficits, normal speech Extremities: wound vac in place to right hip with serosanguinous fluid in canister Skin: dry, intact, normal temperature Psychiatry: normal mood, congruent affect, judgement and insight appear normal    Data Reviewed: I have personally reviewed following labs and imaging studies  CBC: Recent Labs  Lab 03/26/23 0530 03/27/23 0615 03/27/23 1400 03/28/23 0511 03/29/23 1015 03/30/23 0447  WBC 10.0 13.6*  --  13.9* 16.1* 11.0*  HGB 8.3* 7.1* 7.4* 8.1* 7.6* 7.3*  HCT 25.9* 21.0* 22.8* 23.9* 23.1* 21.7*  MCV 94.5 92.9  --  96.0 96.7 95.2  PLT 290 282  --  285 278 286   Basic Metabolic  Panel: Recent Labs  Lab 03/25/23 0437 03/26/23 0530 03/26/23 0531 03/27/23 0615 03/28/23 0511 03/29/23 1015 03/30/23 0447  NA 137 135  --  135 135 134* 136  K 4.2 3.6  --  3.3* 4.3 3.6 3.3*  CL 105 102  --  100 98 98 99  CO2 25 27  --  29 28 28 29   GLUCOSE 125* 151*  --  162* 160* 177* 148*  BUN 34* 39*  --  40* 43* 48* 49*  CREATININE 1.16* 1.10*  --  1.14* 1.37* 1.32* 1.34*  CALCIUM 8.9 9.1  --  8.6* 8.4* 8.5* 8.6*  MG 1.4*  --  2.1 1.7 1.5* 1.9  --    GFR: Estimated Creatinine Clearance: 34.3 mL/min (A) (by C-G formula based on SCr of 1.34 mg/dL (H)). Liver Function Tests: No results for input(s): "AST", "ALT", "ALKPHOS", "BILITOT", "PROT", "ALBUMIN" in the last 168 hours.  No results for input(s): "LIPASE", "AMYLASE" in the last 168 hours. No results for input(s): "AMMONIA" in the last 168 hours. Coagulation Profile: No results for input(s): "INR", "PROTIME" in the last 168 hours.  Sepsis Labs: No results for input(s): "PROCALCITON", "LATICACIDVEN" in the last 168 hours.   Recent Results (from the past 240 hour(s))  Aerobic Culture w Gram Stain (superficial specimen)     Status: None   Collection Time: 03/21/23 12:00 PM   Specimen: Wound  Result Value Ref Range Status   Specimen Description   Final    WOUND Performed at [facility], [address], [place], [place] [address]    Special Requests   Final    NONE Performed at [facility], [address] [place], [place] [address]    Gram Stain   Final    MODERATE WBC PRESENT,BOTH PMN AND MONONUCLEAR NO ORGANISMS SEEN    Culture   Final    RARE SERRATIA MARCESCENS SUSCEPTIBILITIES PERFORMED ON PREVIOUS CULTURE WITHIN THE LAST 5 DAYS. Performed at [facility], [address], [place], [place] [address]    Report Status 03/23/2023 FINAL  Final  Aerobic/Anaerobic Culture w Gram Stain (surgical/deep wound)     Status: None   Collection Time: 03/21/23 12:01 PM   Specimen: Wound; Body  Fluid  Result Value Ref Range Status   Specimen Description   Final    WOUND Performed at [facility], [address], [place], [place] [address]    Special Requests   Final    NONE Performed at [facility], [address] [place], [place] [address]    Gram Stain   Final    FEW WBC PRESENT,BOTH PMN AND MONONUCLEAR NO ORGANISMS SEEN    Culture   Final    RARE SERRATIA MARCESCENS NO ANAEROBES  ISOLATED Performed at [facility], [address], [place], [place] [address]    Report Status 03/26/2023 FINAL  Final   Organism ID, Bacteria SERRATIA MARCESCENS  Final      Susceptibility   Serratia marcescens - MIC*    CEFEPIME <=0.12 SENSITIVE Sensitive     CEFTAZIDIME <=1 SENSITIVE Sensitive     CEFTRIAXONE <=0.25 SENSITIVE Sensitive     CIPROFLOXACIN <=0.25 SENSITIVE Sensitive     GENTAMICIN <=1 SENSITIVE Sensitive     TRIMETH/SULFA <=20 SENSITIVE Sensitive     * RARE SERRATIA MARCESCENS  Aerobic Culture w Gram Stain (superficial specimen)     Status: None   Collection Time: 03/21/23 12:01 PM   Specimen: Wound  Result Value Ref Range Status   Specimen Description   Final    WOUND Performed at [facility], [address] [place], [place] [address]    Special Requests   Final    NONE Performed at [facility], [address] [place], [place] [address]    Gram Stain   Final    FEW WBC PRESENT,BOTH PMN AND MONONUCLEAR NO ORGANISMS SEEN Performed at [facility], [address] [place], [place] [address]    Culture RARE SERRATIA MARCESCENS  Final   Report Status 03/23/2023 FINAL  Final   Organism ID, Bacteria SERRATIA MARCESCENS  Final      Susceptibility   Serratia marcescens - MIC*    CEFEPIME <=0.12 SENSITIVE Sensitive     CEFTAZIDIME <=1 SENSITIVE Sensitive     CEFTRIAXONE <=0.25 SENSITIVE Sensitive     CIPROFLOXACIN <=0.25 SENSITIVE Sensitive     GENTAMICIN <=1 SENSITIVE Sensitive     TRIMETH/SULFA <=20  SENSITIVE Sensitive     * RARE SERRATIA MARCESCENS         Radiology Studies: No results found.      Scheduled Meds:  acetaminophen  1,000 mg Oral Q8H   allopurinol  300 mg Oral Daily   atorvastatin  80 mg Oral Daily   Chlorhexidine Gluconate Cloth  6 each Topical Daily   citalopram  20 mg Oral Daily   clopidogrel  75 mg Oral Daily   feeding supplement  237 mL Oral TID BM   liver oil-zinc oxide   Topical BID   metoprolol tartrate  12.5 mg Oral BID   multivitamin with minerals  1 tablet Oral Daily   sodium chloride flush  10-40 mL Intracatheter Q12H   torsemide  20 mg Oral Daily   Continuous Infusions:  cefTRIAXone (ROCEPHIN)  IV 2 g (03/30/23 1025)     LOS: 12 days    Time spent: 40 minutes    Pennie Banter, DO [facility] Pager [phone]  If 7PM-7AM, please contact night-coverage [email or web address]  03/30/2023, 4:22 PM

## 2023-03-30 NOTE — Progress Notes (Addendum)
PT Cancellation Note  Patient Details Name: Brooke Davis MRN: [id number] DOB: 1940/09/09   Cancelled Treatment:    Reason Eval/Treat Not Completed: Medical issues which prohibited therapy  Pt receiving blood this pm.  Will hold PM therapy session and continue tomorrow.   Danielle Dess 03/30/2023, 12:51 PM

## 2023-03-30 NOTE — Progress Notes (Signed)
Occupational Therapy Treatment Patient Details Name: Brooke Davis MRN: [id number] DOB: [birth year] Today's Date: 03/30/2023   History of present illness Pt is an 82 year old female s/p washout of R thigh collection with washout of total hip arthroplasty with poly exchange and head exchange; PMH significant for DM2, HTN, CAD s/p PCI and stent, dCHF A fib on Eliquis, hospitalized in July 2024 for elective hip replacement, again hospitalized in July 2024 with an acute embolic stroke while on Eliquis, with workup showing left carotid artery stenosis, s/p carotid stent placement on 02/25/23   OT comments  Upon entering the room, pt supine in bed and agreeable to OT intervention. Family present in room but they step out during session. Min A for bed mobility with supervision for static sitting balance. Pt needing assistance to thread brief onto B LEs and pt stands with min A and use of RW. Once standing, pt then reports urgent need for BM and BSC brought into room. Pt able to have BM and does stand to wipe herself but needing assistance for thoroughness and A to pull brief over B hips. Pt then transitions to PT session and therapist exits the room. All needs within reach.       If plan is discharge home, recommend the following:  A little help with walking and/or transfers;A lot of help with bathing/dressing/bathroom;Assistance with cooking/housework;Assist for transportation;Help with stairs or ramp for entrance   Equipment Recommendations  None recommended by OT       Precautions / Restrictions Precautions Precautions: Posterior Hip;Fall Precaution Booklet Issued: Yes (comment) Restrictions Weight Bearing Restrictions: Yes RLE Weight Bearing: Weight bearing as tolerated LLE Weight Bearing: Weight bearing as tolerated Other Position/Activity Restrictions: RLE WBAT       Mobility Bed Mobility Overal bed mobility: Needs Assistance Bed Mobility: Supine to Sit     Supine to sit: Min  assist          Transfers Overall transfer level: Needs assistance Equipment used: Rolling walker (2 wheels) Transfers: Bed to chair/wheelchair/BSC       Step pivot transfers: Min assist           Balance Overall balance assessment: Needs assistance Sitting-balance support: Feet supported Sitting balance-Leahy Scale: Good     Standing balance support: Bilateral upper extremity supported, Reliant on assistive device for balance Standing balance-Leahy Scale: Fair                             ADL either performed or assessed with clinical judgement   ADL Overall ADL's : Needs assistance/impaired                     Lower Body Dressing: Moderate assistance;Sit to/from stand   Toilet Transfer: Minimal assistance;Rolling walker (2 wheels);BSC/3in1   Toileting- Clothing Manipulation and Hygiene: Moderate assistance;Sit to/from stand              Extremity/Trunk Assessment Upper Extremity Assessment Upper Extremity Assessment: Overall WFL for tasks assessed            Vision Patient Visual Report: No change from baseline            Cognition Arousal: Alert Behavior During Therapy: WFL for tasks assessed/performed Overall Cognitive Status: Within Functional Limits for tasks assessed  Pertinent Vitals/ Pain       Pain Assessment Pain Assessment: No/denies pain         Frequency  Min 1X/week        Progress Toward Goals  OT Goals(current goals can now be found in the care plan section)  Progress towards OT goals: Progressing toward goals      AM-PAC OT "6 Clicks" Daily Activity     Outcome Measure   Help from another person eating meals?: None Help from another person taking care of personal grooming?: A Little Help from another person toileting, which includes using toliet, bedpan, or urinal?: A Lot Help from another person bathing (including  washing, rinsing, drying)?: A Lot Help from another person to put on and taking off regular upper body clothing?: A Little Help from another person to put on and taking off regular lower body clothing?: A Lot 6 Click Score: 16    End of Session Equipment Utilized During Treatment: Rolling walker (2 wheels)  OT Visit Diagnosis: Other abnormalities of gait and mobility (R26.89)   Activity Tolerance Patient tolerated treatment well   Patient Left in bed;with call bell/phone within reach;with bed alarm set   Nurse Communication Mobility status        Time: 2355-7322 OT Time Calculation (min): 11 min  Charges: OT General Charges $OT Visit: 1 Visit OT Treatments $Self Care/Home Management : 8-22 mins  Jackquline Denmark, MS, OTR/L , CBIS ascom [phone]  03/30/23, 12:58 PM

## 2023-03-30 NOTE — Progress Notes (Signed)
Physical Therapy Treatment Patient Details Name: Brooke Davis MRN: [id number] DOB: September 02, 1940 Today's Date: 03/30/2023   History of Present Illness Pt is an 82 year old female s/p washout of R thigh collection with washout of total hip arthroplasty with poly exchange and head exchange; PMH significant for DM2, HTN, CAD s/p PCI and stent, dCHF A fib on Eliquis, hospitalized in July 2024 for elective hip replacement, again hospitalized in July 2024 with an acute embolic stroke while on Eliquis, with workup showing left carotid artery stenosis, s/p carotid stent placement on 02/25/23    PT Comments  Pt with OT upon arrival.  Slight overlap/handoff during session as while she was standing with OT she needed BSC.  Obtained BSC and she is able to transfer with RW and CGA.  Is able to stand for care with improved balance today and no LOB noted today which she has had the past few days.  After care, she takes a short rest on bed before she is able to walk 65' in hallway with RW and cga x 1.  Overall improved gait and activity tolerance today.  Remains in chair after session.   If plan is discharge home, recommend the following: A little help with walking and/or transfers;A little help with bathing/dressing/bathroom;Direct supervision/assist for medications management;Direct supervision/assist for financial management;Assist for transportation;Help with stairs or ramp for entrance;Assistance with cooking/housework   Can travel by private vehicle        Equipment Recommendations       Recommendations for Other Services       Precautions / Restrictions Precautions Precautions: Posterior Hip;Fall Precaution Booklet Issued: Yes (comment) Restrictions Weight Bearing Restrictions: Yes RLE Weight Bearing: Weight bearing as tolerated LLE Weight Bearing: Weight bearing as tolerated Other Position/Activity Restrictions: RLE WBAT     Mobility  Bed Mobility               General bed mobility  comments: pt sitting EOB with OT upon arrival Patient Response: Cooperative  Transfers Overall transfer level: Needs assistance Equipment used: Rolling walker (2 wheels) Transfers: Sit to/from Stand Sit to Stand: Contact guard assist           General transfer comment: Pt was able to stand from EOB with CGA for safety    Ambulation/Gait Ambulation/Gait assistance: Contact guard assist Gait Distance (Feet): 65 Feet Assistive device: Rolling walker (2 wheels) Gait Pattern/deviations: Step-to pattern Gait velocity: decreased     General Gait Details: irregular step pattern but progressing to more step-through gait   Stairs             Wheelchair Mobility     Tilt Bed Tilt Bed Patient Response: Cooperative  Modified Rankin (Stroke Patients Only)       Balance Overall balance assessment: Needs assistance Sitting-balance support: Feet supported Sitting balance-Leahy Scale: Good     Standing balance support: Bilateral upper extremity supported, Reliant on assistive device for balance Standing balance-Leahy Scale: Fair Standing balance comment: no LOB today with toileting or gait.                            Cognition Arousal: Alert Behavior During Therapy: WFL for tasks assessed/performed Overall Cognitive Status: Within Functional Limits for tasks assessed  Exercises Other Exercises Other Exercises: BSC for BM    General Comments        Pertinent Vitals/Pain Pain Assessment Pain Assessment: No/denies pain Pain Intervention(s): Premedicated before session    Home Living                          Prior Function            PT Goals (current goals can now be found in the care plan section) Progress towards PT goals: Progressing toward goals    Frequency    BID      PT Plan      Co-evaluation              AM-PAC PT "6 Clicks" Mobility   Outcome  Measure  Help needed turning from your back to your side while in a flat bed without using bedrails?: A Little Help needed moving from lying on your back to sitting on the side of a flat bed without using bedrails?: A Little Help needed moving to and from a bed to a chair (including a wheelchair)?: A Little Help needed standing up from a chair using your arms (e.g., wheelchair or bedside chair)?: A Little Help needed to walk in hospital room?: A Little Help needed climbing 3-5 steps with a railing? : A Lot 6 Click Score: 17    End of Session Equipment Utilized During Treatment: Gait belt Activity Tolerance: Patient tolerated treatment well Patient left: in chair;with call bell/phone within reach;with chair alarm set;with nursing/sitter in room Nurse Communication: Mobility status;Precautions;Weight bearing status PT Visit Diagnosis: Other abnormalities of gait and mobility (R26.89);Unsteadiness on feet (R26.81);Muscle weakness (generalized) (M62.81);Difficulty in walking, not elsewhere classified (R26.2);Pain Pain - Right/Left: Right Pain - part of body: Hip     Time: 0937-1002 PT Time Calculation (min) (ACUTE ONLY): 25 min  Charges:    $Gait Training: 23-37 mins PT General Charges $$ ACUTE PT VISIT: 1 Visit                   Danielle Dess, PTA 03/30/23, 11:02 AM

## 2023-03-30 NOTE — Plan of Care (Signed)
  Problem: Fluid Volume: Goal: Hemodynamic stability will improve Outcome: Progressing   Problem: Clinical Measurements: Goal: Diagnostic test results will improve Outcome: Progressing Goal: Signs and symptoms of infection will decrease Outcome: Progressing   Problem: Respiratory: Goal: Ability to maintain adequate ventilation will improve Outcome: Progressing   Problem: Clinical Measurements: Goal: Ability to maintain clinical measurements within normal limits will improve Outcome: Progressing Goal: Will remain free from infection Outcome: Progressing Goal: Diagnostic test results will improve Outcome: Progressing Goal: Respiratory complications will improve Outcome: Progressing Goal: Cardiovascular complication will be avoided Outcome: Progressing   Problem: Activity: Goal: Risk for activity intolerance will decrease Outcome: Progressing   Problem: Nutrition: Goal: Adequate nutrition will be maintained Outcome: Progressing   Problem: Elimination: Goal: Will not experience complications related to bowel motility Outcome: Progressing Goal: Will not experience complications related to urinary retention Outcome: Progressing   Problem: Pain Managment: Goal: General experience of comfort will improve Outcome: Progressing   Problem: Safety: Goal: Ability to remain free from injury will improve Outcome: Progressing   Problem: Skin Integrity: Goal: Risk for impaired skin integrity will decrease Outcome: Progressing   Problem: Education: Goal: Knowledge of the prescribed therapeutic regimen will improve Outcome: Progressing Goal: Understanding of discharge needs will improve Outcome: Progressing Goal: Individualized Educational Video(s) Outcome: Progressing   Problem: Activity: Goal: Ability to avoid complications of mobility impairment will improve Outcome: Progressing Goal: Ability to tolerate increased activity will improve Outcome: Progressing   Problem:  Clinical Measurements: Goal: Postoperative complications will be avoided or minimized Outcome: Progressing   Problem: Pain Management: Goal: Pain level will decrease with appropriate interventions Outcome: Progressing   Problem: Skin Integrity: Goal: Will show signs of wound healing Outcome: Progressing   Problem: Education: Goal: Knowledge of disease or condition will improve Outcome: Progressing Goal: Knowledge of secondary prevention will improve (MUST DOCUMENT ALL) Outcome: Progressing Goal: Knowledge of patient specific risk factors will improve [name] N/A or DELETE if not current risk factor) Outcome: Progressing   Problem: Ischemic Stroke/TIA Tissue Perfusion: Goal: Complications of ischemic stroke/TIA will be minimized Outcome: Progressing   Problem: Coping: Goal: Will verbalize positive feelings about self Outcome: Progressing Goal: Will identify appropriate support needs Outcome: Progressing   Problem: Health Behavior/Discharge Planning: Goal: Ability to manage health-related needs will improve Outcome: Progressing Goal: Goals will be collaboratively established with patient/family Outcome: Progressing   Problem: Self-Care: Goal: Ability to participate in self-care as condition permits will improve Outcome: Progressing Goal: Verbalization of feelings and concerns over difficulty with self-care will improve Outcome: Progressing Goal: Ability to communicate needs accurately will improve Outcome: Progressing   Problem: Nutrition: Goal: Risk of aspiration will decrease Outcome: Progressing Goal: Dietary intake will improve Outcome: Progressing

## 2023-03-30 NOTE — Plan of Care (Signed)

## 2023-03-31 ENCOUNTER — Encounter (INDEPENDENT_AMBULATORY_CARE_PROVIDER_SITE_OTHER): Payer: Self-pay | Admitting: Vascular Surgery

## 2023-03-31 DIAGNOSIS — A419 Sepsis, unspecified organism: Secondary | ICD-10-CM | POA: Diagnosis not present

## 2023-03-31 DIAGNOSIS — Z7189 Other specified counseling: Secondary | ICD-10-CM | POA: Diagnosis not present

## 2023-03-31 LAB — BPAM RBC
Blood Product Expiration Date: 202409202359
Blood Product Expiration Date: 202409272359
ISSUE DATE / TIME: 202409071445
ISSUE DATE / TIME: 202409101205
Unit Type and Rh: 600
Unit Type and Rh: 600

## 2023-03-31 LAB — CBC
HCT: 25.6 % — ABNORMAL LOW (ref 36.0–46.0)
Hemoglobin: 8.5 g/dL — ABNORMAL LOW (ref 12.0–15.0)
MCH: 31.6 pg (ref 26.0–34.0)
MCHC: 33.2 g/dL (ref 30.0–36.0)
MCV: 95.2 fL (ref 80.0–100.0)
Platelets: 299 10*3/uL (ref 150–400)
RBC: 2.69 MIL/uL — ABNORMAL LOW (ref 3.87–5.11)
RDW: 19.9 % — ABNORMAL HIGH (ref 11.5–15.5)
WBC: 9.7 10*3/uL (ref 4.0–10.5)
nRBC: 0 % (ref 0.0–0.2)

## 2023-03-31 LAB — TYPE AND SCREEN
ABO/RH(D): A NEG
Antibody Screen: NEGATIVE
Unit division: 0
Unit division: 0

## 2023-03-31 LAB — BASIC METABOLIC PANEL
Anion gap: 6 (ref 5–15)
BUN: 53 mg/dL — ABNORMAL HIGH (ref 8–23)
CO2: 28 mmol/L (ref 22–32)
Calcium: 8.7 mg/dL — ABNORMAL LOW (ref 8.9–10.3)
Chloride: 103 mmol/L (ref 98–111)
Creatinine, Ser: 1.18 mg/dL — ABNORMAL HIGH (ref 0.44–1.00)
GFR, Estimated: 47 mL/min — ABNORMAL LOW (ref >60)
Glucose, Bld: 133 mg/dL — ABNORMAL HIGH (ref 70–99)
Potassium: 3.5 mmol/L (ref 3.5–5.1)
Sodium: 137 mmol/L (ref 135–145)

## 2023-03-31 LAB — MAGNESIUM: Magnesium: 1.8 mg/dL (ref 1.7–2.4)

## 2023-03-31 NOTE — Progress Notes (Signed)
Daily Progress Note   Patient Name: Brooke Davis       Date: 03/31/2023 DOB: 10/17/1940  Age: 82 y.o. MRN#: [id number] Attending Physician: Loyce Dys, MD Primary Care Physician: [name], MD Admit Date: 03/18/2023  Reason for Consultation/Follow-up: Establishing goals of care  Subjective: Notes and labs reviewed.  In to see patient.  She is currently resting with eyes closed and opens them intermittently during my visit.  Patient's daughters are at bedside.  They discussed concerns for discharge to rehab and patient's readiness for discharge.  We discussed rehab and daughter states that patient has a lot of pain with trying to move, but does not have pain with laying perfectly still on her back.  Discussed importance of a functional pain assessment, and importance of her ability to be mobile as her goal is to return home after rehab.  PT/OT into work with patient.  Patient fully awoke with PT/OT entry.  Patient denies questions or concerns.  Length of Stay: 13  Current Medications: Scheduled Meds:   acetaminophen  1,000 mg Oral Q8H   allopurinol  300 mg Oral Daily   atorvastatin  80 mg Oral Daily   Chlorhexidine Gluconate Cloth  6 each Topical Daily   citalopram  20 mg Oral Daily   clopidogrel  75 mg Oral Daily   feeding supplement  237 mL Oral TID BM   liver oil-zinc oxide   Topical BID   metoprolol tartrate  12.5 mg Oral BID   multivitamin with minerals  1 tablet Oral Daily   sodium chloride flush  10-40 mL Intracatheter Q12H   torsemide  20 mg Oral Daily    Continuous Infusions:  cefTRIAXone (ROCEPHIN)  IV 2 g (03/31/23 1002)    PRN Meds: ALPRAZolam, melatonin, morphine injection, ondansetron **OR** ondansetron (ZOFRAN) IV, oxyCODONE, senna, sodium chloride  flush  Physical Exam Pulmonary:     Effort: Pulmonary effort is normal.  Neurological:     Mental Status: She is alert.             Vital Signs: BP (!) 149/60   Pulse 68   Temp 97.9 F (36.6 C)   Resp 16   Ht 5\' 2"  (1.575 m)   Wt 85.2 kg   SpO2 100%   BMI 34.35 kg/m  SpO2: SpO2: 100 %  O2 Device: O2 Device: Room Air O2 Flow Rate:    Intake/output summary:  Intake/Output Summary (Last 24 hours) at 03/31/2023 1239 Last data filed at 03/31/2023 0604 Gross per 24 hour  Intake 834 ml  Output 650 ml  Net 184 ml   LBM: Last BM Date : 03/30/23 Baseline Weight: Weight: 81.2 kg Most recent weight: Weight: 85.2 kg   Patient Active Problem List   Diagnosis Date Noted   ABLA (acute blood loss anemia) 03/27/2023   Iron deficiency anemia 03/27/2023   Serratia infection 03/25/2023   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 02/2023 03/19/2023   CAD S/P percutaneous coronary angioplasty 03/19/2023   Generalized weakness 03/19/2023   Chronic anticoagulation 03/19/2023   Cellulitis of right hip 03/19/2023   Sepsis (HCC) 03/18/2023   Carotid stenosis, symptomatic, with infarction (HCC) 02/25/2023   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   Atrial fibrillation, chronic (HCC) 01/28/2023   Dyslipidemia 01/28/2023   Chronic diastolic CHF (congestive heart failure) (HCC) 01/28/2023   Gout 01/28/2023   Anxiety and depression 01/28/2023   Right arm weakness 01/28/2023   Acute CVA (cerebrovascular accident) (HCC) 01/28/2023   Bilateral carotid artery stenosis 01/28/2023   Hx of total hip arthroplasty, 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 01/18/2023   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   Slurred speech 09/23/2021   STEMI involving oth coronary artery of inferior wall (HCC) 01/11/2021   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) 01/11/2021    Controlled type 2 diabetes mellitus with hyperglycemia, without long-term current use of insulin (HCC) 01/11/2021   Pure hypercholesterolemia 01/11/2021   Hypercalcemia 01/11/2021   DDD (degenerative disc disease), lumbar 12/01/2013   Lumbar radiculitis 12/01/2013   Atrial fibrillation, transient (HCC) 11/10/2013   Sinoatrial node dysfunction (HCC) 11/10/2013   Primary hypertension 11/10/2013   Poorly-controlled hypertension 11/10/2013    Palliative Care Assessment & Plan     Recommendations/Plan: Patient working towards rehab placement.  Discussed importance of functional pain assessment as patient will need to be able to work with PT/OT.  Code Status:    Code Status Orders  (From admission, onward)           Start     Ordered   03/22/23 1522  Do not attempt resuscitation (DNR) Pre-Arrest Interventions Desired  (Code Status)  Continuous       Question Answer Comment  If pulseless and not breathing No CPR or chest compressions.   In Pre-Arrest Conditions (Patient Has Pulse and Is Breathing) May intubate, use advanced airway interventions and cardioversion/ACLS medications if appropriate or indicated. May transfer to ICU.   Consent: Discussion documented in EHR or advanced directives reviewed      03/22/23 1521           Code Status History     Date Active Date Inactive Code Status Order ID Comments User Context   03/19/2023 0116 03/22/2023 1521 Full Code [id number]  Andris Baumann, MD ED   02/25/2023 1027 03/01/2023 2011 Full Code [id number]  Annice Needy, MD Inpatient   02/02/2023 1509 02/10/2023 1529 Full Code [id number]  Charlton Amor, PA-C Inpatient   02/02/2023 1509 02/02/2023 1509 Full Code [id number]  Charlton Amor, PA-C Inpatient   01/27/2023 2049 02/02/2023 1508 Full Code [id number]  Mansy, Vernetta Honey, MD ED   01/18/2023 1255 01/19/2023 1625 Full Code [id number]  Hooten, Illene Labrador, MD  Inpatient   01/12/2021 0013 01/13/2021 1754 Full Code [id number]  Marlow Baars, MD  Inpatient   01/11/2021 1301 01/12/2021 0013 Full Code [id number]  Marcina Millard, MD Inpatient       Care plan was discussed with PT/OT at bedside  Thank you for allowing the Palliative Medicine Team to assist in the care of this patient.  Morton Stall, NP  Please contact Palliative Medicine Team phone at [phone] for questions and concerns.

## 2023-03-31 NOTE — Progress Notes (Signed)
Physical Therapy Treatment Patient Details Name: Brooke Davis MRN: [id number] DOB: 13-Apr-1941 Today's Date: 03/31/2023   History of Present Illness Pt is an 82 year old female s/p washout of R thigh collection with washout of total hip arthroplasty with poly exchange and head exchange; PMH significant for DM2, HTN, CAD s/p PCI and stent, dCHF A fib on Eliquis, hospitalized in July 2024 for elective hip replacement, again hospitalized in July 2024 with an acute embolic stroke while on Eliquis, with workup showing left carotid artery stenosis, s/p carotid stent placement on 02/25/23    PT Comments  Progressive increase in gait distance (75' x2), with improved affect, pain control and overall activity tolerance this date.  Increased cuing for gait mechanics as fatigue increased; responds well to cuing and encouragement  Daughters x2 present and supportive, encouraging to patient throughout session.    If plan is discharge home, recommend the following: A little help with walking and/or transfers;A little help with bathing/dressing/bathroom;Direct supervision/assist for medications management;Direct supervision/assist for financial management;Assist for transportation;Help with stairs or ramp for entrance;Assistance with cooking/housework   Can travel by private vehicle     Yes  Equipment Recommendations       Recommendations for Other Services       Precautions / Restrictions Precautions Precautions: Posterior Hip;Fall Precaution Comments: R hip wound vac Restrictions Weight Bearing Restrictions: Yes RLE Weight Bearing: Weight bearing as tolerated     Mobility  Bed Mobility Overal bed mobility: Needs Assistance Bed Mobility: Supine to Sit, Sit to Supine     Supine to sit: Supervision Sit to supine: Supervision, Contact guard assist   General bed mobility comments: prefers to exit from L side of bed for pain control    Transfers Overall transfer level: Needs  assistance Equipment used: Rolling walker (2 wheels) Transfers: Sit to/from Stand, Bed to chair/wheelchair/BSC Sit to Stand: Contact guard assist                Ambulation/Gait Ambulation/Gait assistance: Contact guard assist Gait Distance (Feet):  (75' x2)           General Gait Details: partially reciprocal stepping pattern; min cuing for R LE step height/length and overall foot clearance as fatigue increases; min cuing for walker position and postural extension   Stairs             Wheelchair Mobility     Tilt Bed    Modified Rankin (Stroke Patients Only)       Balance Overall balance assessment: Needs assistance Sitting-balance support: No upper extremity supported, Feet supported Sitting balance-Leahy Scale: Good     Standing balance support: Bilateral upper extremity supported Standing balance-Leahy Scale: Fair                              Cognition Arousal: Alert Behavior During Therapy: WFL for tasks assessed/performed Overall Cognitive Status: Within Functional Limits for tasks assessed                                          Exercises      General Comments        Pertinent Vitals/Pain Pain Assessment Pain Assessment: 0-10 Pain Score: 2  Pain Location: R hip Pain Descriptors / Indicators: Aching, Grimacing, Guarding Pain Intervention(s): Monitored during session, Limited activity within patient's tolerance, Premedicated before session, Repositioned  Home Living                          Prior Function            PT Goals (current goals can now be found in the care plan section) Acute Rehab PT Goals Patient Stated Goal: get better so I can safely go home PT Goal Formulation: With patient/family Time For Goal Achievement: 04/05/23 Potential to Achieve Goals: Good Progress towards PT goals: Progressing toward goals    Frequency    BID      PT Plan      Co-evaluation               AM-PAC PT "6 Clicks" Mobility   Outcome Measure  Help needed turning from your back to your side while in a flat bed without using bedrails?: None Help needed moving from lying on your back to sitting on the side of a flat bed without using bedrails?: None Help needed moving to and from a bed to a chair (including a wheelchair)?: A Little Help needed standing up from a chair using your arms (e.g., wheelchair or bedside chair)?: A Little Help needed to walk in hospital room?: A Little Help needed climbing 3-5 steps with a railing? : A Little 6 Click Score: 20    End of Session Equipment Utilized During Treatment: Gait belt Activity Tolerance: Patient tolerated treatment well Patient left: in bed;with bed alarm set;with call bell/phone within reach   PT Visit Diagnosis: Other abnormalities of gait and mobility (R26.89);Unsteadiness on feet (R26.81);Muscle weakness (generalized) (M62.81);Difficulty in walking, not elsewhere classified (R26.2);Pain Pain - Right/Left: Right Pain - part of body: Hip     Time: 1610-9604 PT Time Calculation (min) (ACUTE ONLY): 23 min  Charges:    $Gait Training: 8-22 mins $Therapeutic Activity: 8-22 mins PT General Charges $$ ACUTE PT VISIT: 1 Visit                     Marcell Chavarin H. Manson Passey, PT, DPT, NCS 03/31/23, 10:07 PM [phone]

## 2023-03-31 NOTE — TOC Progression Note (Addendum)
Transition of Care [facility]) - Progression Note    Patient Details  Name: Brooke Davis MRN: [id number] Date of Birth: 07-10-1941  [facility] of [facility]) CM/SW Contact  Marlowe Sax, RN Phone Number: 03/31/2023, 11:41 AM  Clinical Narrative:     Patient is now medically ready and Ins Berkley Harvey is pending  Expected Discharge Plan: Skilled Nursing Facility Barriers to Discharge: English as a second language teacher, Continued Medical Work up  Expected Discharge Plan and Services   Discharge Planning Services: CM Consult   Living arrangements for the past 2 months: Single Family Home                                       Social Determinants of Health (SDOH) Interventions SDOH Screenings   Food Insecurity: No Food Insecurity (03/19/2023)  Housing: Low Risk  (03/19/2023)  Transportation Needs: No Transportation Needs (03/19/2023)  Utilities: Not At Risk (03/19/2023)  Financial Resource Strain: Patient Declined (11/05/2022)   Received from [facility]  Tobacco Use: Medium Risk (03/18/2023)    Readmission Risk Interventions    03/01/2023   10:20 AM  Readmission Risk Prevention Plan  Transportation Screening Complete  PCP or Specialist Appt within 3-5 Days Complete  HRI or Home Care Consult Complete  Social Work Consult for Recovery Care Planning/Counseling Complete  Palliative Care Screening Not Applicable  Medication Review Oceanographer) Complete

## 2023-03-31 NOTE — Progress Notes (Addendum)
OT Cancellation Note  Patient Details Name: Brooke Davis MRN: [id number] DOB: 1940-09-22   Cancelled Treatment:    Reason Eval/Treat Not Completed: Other (comment) (Pt sleeping and family present, who report they would like for pt to sleep as pt has not been feeling well this morning.)  Will attempt later as time allows.  Danelle Earthly, MS, OTR/L  Otis Dials 03/31/2023, 2:53 PM

## 2023-03-31 NOTE — Progress Notes (Signed)
Progress Note   Patient: Brooke Davis OZD:[id number] DOB: 22-Dec-1940 DOA: 03/18/2023     13 DOS: the patient was seen and examined on 03/31/2023   Subjective: Patient seen and examined at bedside this morning in the presence of daughters Denies nausea vomiting abdominal pain or chest pain Continues to await placement to skilled nursing facility after stabilization of hemoglobin  Brief Hospital course: 82 y.o. female with medical history significant for DM2, HTN, CAD s/p PCI and stent on Plavix, dCHF, chronic A fib on Eliquis, and chronic anxiety, hospitalized in July 2024 with an acute embolic stroke while on Eliquis, with workup showing left carotid artery stenosis s/p L carotid stent placement on 02/25/23 and undergoing total right hip replacement a couple weeks later on 03/09/23,  who presents to the ED with a 3-day history of fever, nausea, dizziness, diarrhea, R hip pain and swelling.     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  Status post right hip irrigation and debridement on 03/21/2023 by orthopedic surgery Dr. Clemencia Course.  Infectious disease consulted and following.   Joint fluid culture growing rare Serratia Marcences, IV antibiotics de-escalated from cefepime and IV vancomycin to Rocephin on 03/23/2023, under the guidance of infectious disease.   Further hospital course and management as outlined below.      Assessment & Plan:   Principal Problem:   Sepsis (HCC) Active Problems:   Hx of total hip arthroplasty, 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   Generalized weakness   Chronic anticoagulation   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 02/2023   Atrial fibrillation, chronic (HCC)   Chronic diastolic CHF (congestive heart failure) (HCC)   Primary hypertension   Anxiety and depression   CAD S/P percutaneous coronary angioplasty   Cellulitis of right hip   Prosthetic joint infection (HCC)   Serratia infection   ABLA (acute blood loss anemia)   Iron  deficiency anemia   * Sepsis (HCC) secondary to right total hip arthroplasty infection, status post I&D by orthopedic surgery Dr. Clemencia Course on 03/21/2023. Joint fluid culture growing rare Serratia Marcences, IV antibiotics de-escalated from cefepime and IV vancomycin to Rocephin on 03/23/2023, under the guidance of infectious disease. Continue current analgesics Appreciate infectious disease assistance. Will remain on IV Rocephin  until 10/14, OPAT orders placed Follow up with Infectious Disease in 4-6 weeks PICC line placed 9/4 Follow orthopedics recommendation below: --WBAT on RLE --Hip precautions --Wound vac applied 9/8 --JP drain removed  --Follow up at [facility] Ortho in 2 weeks   Acute blood loss postsurgery  Iron defiicency Hemoglobin 7.5 from 9.2, 1 unit PRBCs transfused Maintain hemoglobin above 8.0 9/7 -- Hbg dropped 8.3 >> 7.1 with bleeding from hip incision site. Transfused 1 unit RBC's 9/8 -- Hbg improved 7.4 >> 8.1. No bleeding. 9/9 -- Hbg 7.6 no signs of bleeding 9/10 -- Hbg 7.3. Transfuse 1 unit RBC's --Repeat Hbg after transfusion --Repeat CBC in the morning -- Continue to hold Eliquis -- Continue Plavix given new carotid stent one month ago --Defer iron infusion with active infection -Continue-Holding initiation of PO iron for now to avoid dark stools while we monitor for bleeding   Leukocytosis -- WBC was 9.5 >> 10, then increased 9/7 and trended up 13.6 >> 13.9 >> 16.1.  Improved WBC 11.0 today Suspect reactive due to the bleeding she had, then uncontrolled pain and wound vac placement.   Pt has remained afebrile.   --Monitor fever curve, CBC and clinically for s/sx's of infection -- Continue  on IV antibiotics for PJI as outlined, per Infectious Disease -Orthopedics following we appreciate input --Additional surgical washout could be required if signs of worsening infection or WBC continues to rise.   Hx of total hip arthroplasty, 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 Management stated  above. Ortho continues to follow.   Hypokalemia - probably from several loose stools from bowel regimen.   --replace PO Kcl for K 3.3 yesterday Continue to monitor potassium levels closely   Hypomagnesemia -replaced & resolved. 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   Curb sided with vascular surgery, Dr. Wyn Quaker, recommended to restart Eliquis and Plavix within 48 hours of I&D done on 03/21/2023, which was done. Pt developed bleeding from hip surgical site, required blood transfusions this week. --Continue with Plavix --HOLD ELIQUIS (9/7), resume when anemia improves and bleeding resolves --Reached out to update vascular team by chat 9/10 -- Recommending Plavix monotherapy for now, holding Eliquis unless Hbg improves and stabilizes, at least until she returns to clinic to discuss.    Chronic diastolic CHF (congestive heart failure) (HCC) Euvolemic on exam. Continue torsemide Hold Entresto - resume when BP's will tolerate (very soft diastolic BP's) Continue strict I's and O's and daily weight   Atrial fibrillation, chronic (HCC) Chronic anticoagulation Currently rate controlled HOLD Eliquis (9/7 - hip bleeding, Hbg dropped to 7.1) Metoprolol initially held, now resumed at lower dose 12.5 mg BID   CAD S/P percutaneous coronary angioplasty Continue atorvastatin and Plavix   Anxiety and depression Continue citalopram and alprazolam   Primary hypertension BP's overall stable with soft DBP's On metoprolol, reduced dose Resumed on home torsemide Continue to closely monitor vital signs.   CKD stage IIIa - renal function stable. Monitor BMP       DVT prophylaxis: on Plavix /  Eliquis held due to bleeding and progressive anemia requiring  blood transfusion 9/7      Code Status: Currently DNR per the patient.  Short-term intubation okay.  No CPR / chest compressions.  See code status order for partial code limitations.   Family Communication: daughters at bedside on rounds, updated   Disposition Plan: SNF      Consultants:  Orthopedics Curb sided with vascular surgery, Dr. Wyn Quaker. Infectious disease.        Examination:   General exam: awake, alert, no acute distress HEENT: clear conjunctivae, moist mucus membranes, hearing grossly normal, small pupils Respiratory system: CTAB no wheezes, on room air, normal respiratory effort. Lungs clear Cardiovascular system: normal S1/S2, RRR Gastrointestinal system: soft, NT, ND Central nervous system: A&O x 3. no gross focal neurologic deficits, normal speech Extremities: wound vac in place to right hip with serosanguinous fluid in canister Skin: dry, intact, normal temperature Psychiatry: normal mood, congruent affect, judgement and insight appear normal       Data Reviewed: I have reviewed patient's vitals including below mentioned labs     Latest Ref Rng & Units 03/31/2023    5:23 AM 03/30/2023    4:46 PM 03/30/2023    4:47 AM  CBC  WBC 4.0 - 10.5 K/uL 9.7   11.0   Hemoglobin 12.0 - 15.0 g/dL 8.5  8.2  7.3   Hematocrit 36.0 - 46.0 % 25.6   21.7   Platelets 150 - 400 K/uL  299   286     Vitals:   03/30/23 2335 03/31/23 0705 03/31/23 0839 03/31/23 1539  BP: (!) 109/48  (!) 149/60 (!) 119/39  Pulse: 68  68 70  Resp: 20  16 16   Temp: 98.2 F (36.8 C)  97.9 F (36.6 C) 98 F (36.7 C)  TempSrc: Oral     SpO2: 96%  100% 100%  Weight:  85.2 kg    Height:         Author: Loyce Dys, MD 03/31/2023 4:14 PM  For on call review [email or web address].

## 2023-03-31 NOTE — Plan of Care (Signed)

## 2023-03-31 NOTE — Progress Notes (Signed)
Physical Therapy Treatment Patient Details Name: Brooke Davis MRN: [id number] DOB: [birth year] Today's Date: 03/31/2023   History of Present Illness Pt is an 82 year old female s/p washout of R thigh collection with washout of total hip arthroplasty with poly exchange and head exchange; PMH significant for DM2, HTN, CAD s/p PCI and stent, dCHF A fib on Eliquis, hospitalized in July 2024 for elective hip replacement, again hospitalized in July 2024 with an acute embolic stroke while on Eliquis, with workup showing left carotid artery stenosis, s/p carotid stent placement on 02/25/23    PT Comments  Patient with good ability to mobilize R LE with functional activities; tolerating WBing without significant difficulty or increase in pain.  Treatment focus on functional transfers and in-room activities, all performed with RW, cga for safety.  Will plan to progress gait distance this PM as appropriate.    If plan is discharge home, recommend the following: A little help with walking and/or transfers;A little help with bathing/dressing/bathroom;Direct supervision/assist for medications management;Direct supervision/assist for financial management;Assist for transportation;Help with stairs or ramp for entrance;Assistance with cooking/housework   Can travel by private vehicle     No  Equipment Recommendations       Recommendations for Other Services       Precautions / Restrictions Precautions Precautions: Posterior Hip;Fall Precaution Comments: R hip wound vac Restrictions Weight Bearing Restrictions: Yes RLE Weight Bearing: Weight bearing as tolerated     Mobility  Bed Mobility Overal bed mobility: Needs Assistance Bed Mobility: Supine to Sit     Supine to sit: Supervision          Transfers Overall transfer level: Needs assistance Equipment used: Rolling walker (2 wheels) Transfers: Sit to/from Stand, Bed to chair/wheelchair/BSC Sit to Stand: Contact guard assist Stand pivot  transfers: Contact guard assist              Ambulation/Gait Ambulation/Gait assistance: Contact guard assist Gait Distance (Feet): 20 Feet Assistive device: Rolling walker (2 wheels)         General Gait Details: partially reciprocal stepping pattern with fair step height/length; generally cautious and guarded, but no buckling or LOB   Stairs             Wheelchair Mobility     Tilt Bed    Modified Rankin (Stroke Patients Only)       Balance Overall balance assessment: Needs assistance Sitting-balance support: No upper extremity supported, Feet supported Sitting balance-Leahy Scale: Good     Standing balance support: Bilateral upper extremity supported Standing balance-Leahy Scale: Fair                              Cognition Arousal: Alert Behavior During Therapy: WFL for tasks assessed/performed Overall Cognitive Status: Within Functional Limits for tasks assessed                                          Exercises Other Exercises Other Exercises: Toilet transfer, SPT with RW, cga; sit/stand from [facility] with RW, cga; standing balance for hygiene, clothing management, cga.  Good WBing R LE with functional activities    General Comments        Pertinent Vitals/Pain Pain Assessment Pain Assessment: 0-10 Pain Score: 6  Pain Location: R hip Pain Descriptors / Indicators: Aching, Grimacing, Guarding Pain Intervention(s): Limited activity within  patient's tolerance, Monitored during session, Repositioned, Patient requesting pain meds-RN notified, RN gave pain meds during session    Home Living                          Prior Function            PT Goals (current goals can now be found in the care plan section) Acute Rehab PT Goals Patient Stated Goal: get better so I can safely go home PT Goal Formulation: With patient/family Time For Goal Achievement: 04/05/23 Potential to Achieve Goals: Good Progress  towards PT goals: Progressing toward goals    Frequency    BID      PT Plan      Co-evaluation              AM-PAC PT "6 Clicks" Mobility   Outcome Measure  Help needed turning from your back to your side while in a flat bed without using bedrails?: None Help needed moving from lying on your back to sitting on the side of a flat bed without using bedrails?: None Help needed moving to and from a bed to a chair (including a wheelchair)?: A Little Help needed standing up from a chair using your arms (e.g., wheelchair or bedside chair)?: A Little Help needed to walk in hospital room?: A Little Help needed climbing 3-5 steps with a railing? : A Little 6 Click Score: 20    End of Session Equipment Utilized During Treatment: Gait belt Activity Tolerance: Patient tolerated treatment well Patient left: in chair;with call bell/phone within reach;with chair alarm set;with nursing/sitter in room Nurse Communication: Mobility status;Precautions;Weight bearing status PT Visit Diagnosis: Other abnormalities of gait and mobility (R26.89);Unsteadiness on feet (R26.81);Muscle weakness (generalized) (M62.81);Difficulty in walking, not elsewhere classified (R26.2);Pain Pain - Right/Left: Right Pain - part of body: Shoulder     Time: 3474-2595 PT Time Calculation (min) (ACUTE ONLY): 31 min  Charges:    $Gait Training: 8-22 mins $Therapeutic Activity: 8-22 mins PT General Charges $$ ACUTE PT VISIT: 1 Visit                    Argentina Kosch H. Manson Passey, PT, DPT, NCS 03/31/23, 1:57 PM [phone]

## 2023-03-31 NOTE — Progress Notes (Signed)
Subjective: 10 Days Post-Op Procedure(s) (LRB): RIGHT HIP IRRIGATION AND DEBRIDEMENT OF INFECTED TOTAL JOINT, EXCHANGE OF HEAD AND POLY COMPONENTS WITH PLACEMENT OF ANTIBIOTIC BEADS (Right) Patient reports that she is doing  very well today. States that her pain is well controlled with the oral pain meds she is receiving, still reports a 3/10 pain. Patient states that her bottom is been doing better as well.  Nursing staff has been very attentive and good about routine changes and application of creams. Denies any chest pain, shortness of breath, nausea, vomiting, fevers or chills.  We will continue with therapy today. Plan is to go Rehab after hospital stay. Patient is stable for D/C and TOC is looking for placement.  Objective: Vital signs in last 24 hours: Temp:  [97.9 F (36.6 C)-98.2 F (36.8 C)] 98 F (36.7 C) (09/11 1539) Pulse Rate:  [65-70] 70 (09/11 1539) Resp:  [16-20] 16 (09/11 1539) BP: (109-149)/(39-60) 119/39 (09/11 1539) SpO2:  [96 %-100 %] 100 % (09/11 1539) Weight:  [85.2 kg] 85.2 kg (09/11 0705)  Intake/Output from previous day:  Intake/Output Summary (Last 24 hours) at 03/31/2023 1852 Last data filed at 03/31/2023 1613 Gross per 24 hour  Intake 540 ml  Output 950 ml  Net -410 ml    Intake/Output this shift: Total I/O In: 300 [P.O.:300] Out: 300 [Urine:300]  Labs: Recent Labs    03/29/23 1015 03/30/23 0447 03/30/23 1646 03/31/23 0523  HGB 7.6* 7.3* 8.2* 8.5*   Recent Labs    03/30/23 0447 03/31/23 0523  WBC 11.0* 9.7  RBC 2.28* 2.69*  HCT 21.7* 25.6*  PLT 286 299   Recent Labs    03/30/23 0447 03/31/23 0523  NA 136 137  K 3.3* 3.5  CL 99 103  CO2 29 28  BUN 49* 53*  CREATININE 1.34* 1.18*  GLUCOSE 148* 133*  CALCIUM 8.6* 8.7*   No results for input(s): "LABPT", "INR" in the last 72 hours.  EXAM General - Patient is Alert, Appropriate, and Oriented.  Patient is hard of hearing, hearing aids Extremity - Neurologically  intact Neurovascular intact Sensation intact distally Intact pulses distally Dorsiflexion/Plantar flexion intact No cellulitis present Compartment soft Dressing -a wound VAC has been placed over the incision line which shows good drainage and appropriate seal.  No appreciable pus present. There was some mild redness appreciated at the front distal aspect of the incision, that seems to be improved from yesterday, not as tender to touch.  Blanchable Motor Function - intact, moving foot and toes well on exam.  Patient able to plantar and dorsiflex with good strength and range of motion.  Patient has some difficulty performing SLR, but is able to with mild assist from provider, does report some increased pain.  Patient is neurovascularly intact down her right lower extremity to all dermatomes, posterior tibial pulses appreciated 2+.   Past Medical History:  Diagnosis Date   Anxiety    a.) on BZO (alprazolam) PRN   Arthritis    fingers, right hip   Atrial fibrillation (HCC) 11/10/2013   a.) CHA2DS2VASc = 5 (age x2, sex, HTN, T2DM);  b.) rate/rhythm maintained on oral metoprolol tartrate; chronically anticoagulated with apixaban   Bronchitis    Cerebrovascular small vessel disease (chronic)    Coronary artery disease involving native coronary artery of native heart 01/11/2021   a.) LHC 01/11/2021: 40% oLM, 50% mLCx, 30% oLCx, 30% pLAD, 30% mLAD, 99% pRCA (2.75 x 30 mm Resolute Onyx DES), 75% p-mRCA, 60% dRCA  DDD (degenerative disc disease), lumbar 12/01/2013   Diastolic dysfunction 01/12/2021   a.) TTE 01/12/2021 (s/p inf STEMI): EF 55-60%, mild-mod LAE, mild RAE, mod RVE, mild AR/MR, mod TR, G1DD   Diet-controlled type 2 diabetes mellitus (HCC)    Full dentures    Gout    History of bilateral cataract extraction 2021   Hyperlipidemia    Hypertension, essential    Long term current use of anticoagulant    a.) apixaban   PONV (postoperative nausea and vomiting)    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) 01/11/2021   a.) LHC/PCI 01/11/2021 --> culprit lesion 99% pRCA --> 2.75 x 30 mm Resolute Onyx DES x 1   Varicose veins of legs    Wears hearing aid in both ears     Assessment/Plan: 10 Days Post-Op Procedure(s) (LRB): RIGHT HIP IRRIGATION AND DEBRIDEMENT OF INFECTED TOTAL JOINT, EXCHANGE OF HEAD AND POLY COMPONENTS WITH PLACEMENT OF ANTIBIOTIC BEADS (Right) Principal Problem:   Sepsis (HCC) Active Problems:   Primary hypertension   Hx of total hip arthroplasty, 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   Weight as of this encounter: 85.2 kg.  Her white cell count is now trending back down from her previous values.  Trended from 11 yesterday to 9.7 today.  H&H has increased after 1 unit of PRBC. At 8.6 today. 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  Switched to ceftriaxone.  PICC in place, plan for 6 weeks abx per ID then transition to oral abx for at least 3 months.  Patient will continue to work with physical therapy to work on postoperative PT ambulation, gait, ROM and strengthening of her right lower extremity.  Hip Preacutions discussed  Discussed with the patient continuing to utilize ice over the bandage  Woundvac intact with good seal and  suction.  Vascular and hospitalist team has suggested the patient be placed on Plavix/Eliquis.  Given patient has still had issues with dropping H&H, Eliquis still remains held.  Continue on Plavix  Weight-Bearing as tolerated to right leg  Resume TOC placement search for rehab placement  Patient will follow-up with [facility] orthopedics in 2 weeks from surgery for reevaluation  Danise Edge, PA-C [facility] 03/31/2023, 6:52 PM

## 2023-04-01 DIAGNOSIS — A419 Sepsis, unspecified organism: Secondary | ICD-10-CM | POA: Diagnosis not present

## 2023-04-01 DIAGNOSIS — Z7189 Other specified counseling: Secondary | ICD-10-CM | POA: Diagnosis not present

## 2023-04-01 LAB — BASIC METABOLIC PANEL
Anion gap: 10 (ref 5–15)
BUN: 48 mg/dL — ABNORMAL HIGH (ref 8–23)
CO2: 28 mmol/L (ref 22–32)
Calcium: 8.8 mg/dL — ABNORMAL LOW (ref 8.9–10.3)
Chloride: 99 mmol/L (ref 98–111)
Creatinine, Ser: 1.05 mg/dL — ABNORMAL HIGH (ref 0.44–1.00)
GFR, Estimated: 54 mL/min — ABNORMAL LOW (ref >60)
Glucose, Bld: 127 mg/dL — ABNORMAL HIGH (ref 70–99)
Potassium: 3.2 mmol/L — ABNORMAL LOW (ref 3.5–5.1)
Sodium: 137 mmol/L (ref 135–145)

## 2023-04-01 LAB — CBC WITH DIFFERENTIAL/PLATELET
Abs Immature Granulocytes: 0.05 10*3/uL (ref 0.00–0.07)
Basophils Absolute: 0 10*3/uL (ref 0.0–0.1)
Basophils Relative: 0 %
Eosinophils Absolute: 0.3 10*3/uL (ref 0.0–0.5)
Eosinophils Relative: 3 %
HCT: 24.9 % — ABNORMAL LOW (ref 36.0–46.0)
Hemoglobin: 8.1 g/dL — ABNORMAL LOW (ref 12.0–15.0)
Immature Granulocytes: 1 %
Lymphocytes Relative: 14 %
Lymphs Abs: 1.3 10*3/uL (ref 0.7–4.0)
MCH: 30.6 pg (ref 26.0–34.0)
MCHC: 32.5 g/dL (ref 30.0–36.0)
MCV: 94 fL (ref 80.0–100.0)
Monocytes Absolute: 0.6 10*3/uL (ref 0.1–1.0)
Monocytes Relative: 6 %
Neutro Abs: 7.7 10*3/uL (ref 1.7–7.7)
Neutrophils Relative %: 76 %
Platelets: 306 10*3/uL (ref 150–400)
RBC: 2.65 MIL/uL — ABNORMAL LOW (ref 3.87–5.11)
RDW: 19.5 % — ABNORMAL HIGH (ref 11.5–15.5)
WBC: 10 10*3/uL (ref 4.0–10.5)
nRBC: 0 % (ref 0.0–0.2)

## 2023-04-01 MED ORDER — OXYCODONE HCL 5 MG PO TABS: 5.0000 mg | ORAL_TABLET | Freq: Four times a day (QID) | ORAL | 0 refills | Status: DC | PRN

## 2023-04-01 MED ORDER — ALPRAZOLAM 0.25 MG PO TABS: 0.2500 mg | ORAL_TABLET | Freq: Two times a day (BID) | ORAL | 0 refills | Status: AC | PRN

## 2023-04-01 MED ORDER — CEFTRIAXONE IV (FOR PTA / DISCHARGE USE ONLY): 2.0000 g | INTRAVENOUS | 0 refills | Status: AC

## 2023-04-01 MED ORDER — POTASSIUM CHLORIDE CRYS ER 20 MEQ PO TBCR
40.0000 meq | EXTENDED_RELEASE_TABLET | ORAL | Status: AC
  Administered 2023-04-01 (×2): 40 meq via ORAL
  Filled 2023-04-01 (×2): qty 2

## 2023-04-01 NOTE — Progress Notes (Signed)
Occupational Therapy Treatment Patient Details Name: Brooke Davis MRN: [id number] DOB: Nov 08, 1940 Today's Date: 04/01/2023   History of present illness Pt is an 82 year old female s/p washout of R thigh collection with washout of total hip arthroplasty with poly exchange and head exchange; PMH significant for DM2, HTN, CAD s/p PCI and stent, dCHF A fib on Eliquis, hospitalized in July 2024 for elective hip replacement, again hospitalized in July 2024 with an acute embolic stroke while on Eliquis, with workup showing left carotid artery stenosis, s/p carotid stent placement on 02/25/23   OT comments  Pt seen for OT tx. Pt required CGA for ADL transfers from recliner and from [facility] over toilet with RW. Ambulates with SBA to/from the bathroom and able to complete pericare in standing with setup and supv and MIN A for initiating donning over feet. Educated in sequencing to improve LB dressing techniques. Pt progressing well towards goals. Continues to benefit from skilled OT Services.       If plan is discharge home, recommend the following:  A little help with walking and/or transfers;A lot of help with bathing/dressing/bathroom;Assistance with cooking/housework;Assist for transportation;Help with stairs or ramp for entrance   Equipment Recommendations  None recommended by OT    Recommendations for Other Services      Precautions / Restrictions Precautions Precautions: Posterior Hip;Fall Precaution Comments: R hip wound vac Restrictions Weight Bearing Restrictions: Yes RLE Weight Bearing: Weight bearing as tolerated LLE Weight Bearing: Weight bearing as tolerated Other Position/Activity Restrictions: RLE WBAT       Mobility Bed Mobility Overal bed mobility: Needs Assistance Bed Mobility: Sit to Supine       Sit to supine: Min assist   General bed mobility comments: BLE mgt    Transfers Overall transfer level: Needs assistance Equipment used: Rolling walker (2  wheels) Transfers: Sit to/from Stand Sit to Stand: Contact guard assist                 Balance Overall balance assessment: Needs assistance Sitting-balance support: No upper extremity supported, Feet supported Sitting balance-Leahy Scale: Good     Standing balance support: No upper extremity supported, During functional activity Standing balance-Leahy Scale: Fair Standing balance comment: able to stand without UE support to complete clothing mgt during toileting task                           ADL either performed or assessed with clinical judgement   ADL Overall ADL's : Needs assistance/impaired                     Lower Body Dressing: Sit to/from stand;Minimal assistance Lower Body Dressing Details (indicate cue type and reason): MIN A to complete doffing and initiate donning over feet of brief Toilet Transfer: Rolling walker (2 wheels);BSC/3in1;Regular Toilet;Supervision/safety Toilet Transfer Details (indicate cue type and reason): BSC over toilet Toileting- Clothing Manipulation and Hygiene: Set up;Sit to/from stand;Supervision/safety       Functional mobility during ADLs: Contact guard assist;Rolling walker (2 wheels)      Extremity/Trunk Assessment              Vision       Perception     Praxis      Cognition Arousal: Alert Behavior During Therapy: WFL for tasks assessed/performed Overall Cognitive Status: Within Functional Limits for tasks assessed  Exercises      Shoulder Instructions       General Comments      Pertinent Vitals/ Pain       Pain Assessment Pain Assessment: 0-10 Pain Score: 5  Pain Location: R hip Pain Descriptors / Indicators: Aching, Grimacing, Guarding Pain Intervention(s): Limited activity within patient's tolerance, Monitored during session, Premedicated before session, Repositioned, RN gave pain meds during session  Home Living                                           Prior Functioning/Environment              Frequency  Min 1X/week        Progress Toward Goals  OT Goals(current goals can now be found in the care plan section)  Progress towards OT goals: Progressing toward goals  Acute Rehab OT Goals Patient Stated Goal: go home OT Goal Formulation: With patient Time For Goal Achievement: 04/06/23 Potential to Achieve Goals: Good  Plan      Co-evaluation                 AM-PAC OT "6 Clicks" Daily Activity     Outcome Measure   Help from another person eating meals?: None Help from another person taking care of personal grooming?: None Help from another person toileting, which includes using toliet, bedpan, or urinal?: A Little Help from another person bathing (including washing, rinsing, drying)?: A Lot Help from another person to put on and taking off regular upper body clothing?: A Little Help from another person to put on and taking off regular lower body clothing?: A Little 6 Click Score: 19    End of Session Equipment Utilized During Treatment: Rolling walker (2 wheels);Gait belt  OT Visit Diagnosis: Other abnormalities of gait and mobility (R26.89)   Activity Tolerance Patient tolerated treatment well   Patient Left in bed;with call bell/phone within reach;with bed alarm set;with nursing/sitter in room;with family/visitor present;Other (comment) (wound vac in place)   Nurse Communication Mobility status        Time: 1610-9604 OT Time Calculation (min): 24 min  Charges: OT General Charges $OT Visit: 1 Visit OT Treatments $Self Care/Home Management : 23-37 mins  Arman Filter., MPH, MS, OTR/L ascom [phone] 04/01/23, 1:38 PM

## 2023-04-01 NOTE — Discharge Summary (Addendum)
Physician Discharge Summary   Patient: Brooke Davis MRN: [id number] DOB: 30-Sep-1940  Admit date:     03/18/2023  Discharge date: 04/01/23  Discharge Physician: Loyce Dys   PCP: [name], MD   Recommendations at discharge:  {Lab with orthopedics, vascular surgery Follow-up with infectious disease Dr. Judyann Munson  Discharge Diagnoses: Sepsis [facility]) secondary to right total hip arthroplasty infection, status post I&D by orthopedic surgery Dr. Clemencia Course on 03/21/2023. 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   Hypomagnesemia -replaced & resolved. 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 female with medical history significant for DM2, HTN, CAD s/p PCI and stent on Plavix, dCHF, chronic A fib on Eliquis, and chronic anxiety, hospitalized in July 2024 with an acute embolic stroke while on Eliquis, with workup showing left carotid artery stenosis s/p L carotid stent placement on 02/25/23 and undergoing total right hip replacement a couple weeks later on 03/09/23,  who presents to the ED with a 3-day history of fever, nausea, dizziness, diarrhea, R hip pain and swelling.     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  Status post right hip irrigation and debridement on 03/21/2023 by orthopedic surgery Dr. Clemencia Course.  Infectious disease consulted and following.  Joint fluid culture growing rare Serratia Marcences, IV antibiotics de-escalated from cefepime and IV vancomycin to Rocephin on 03/23/2023, under the  guidance of infectious disease.  Infectious disease has recommended for patient to be discharged on IV ceftriaxone daily with end date of October 14.  Patient will have to follow-up with orthopedic surgeon, infectious disease as well as vascular surgery  Consultants: Infectious disease, orthopedics, vascular Procedures performed: As shown above Disposition: Skilled nursing facility Diet recommendation:  Cardiac diet DISCHARGE MEDICATION: Allergies as of 04/01/2023       Reactions   Celebrex [celecoxib] Diarrhea   Penicillins Itching   IgE = 154 (WNL) on 01/05/2023   Relafen [nabumetone]    Palpitation   Lovastatin Diarrhea        Medication List     STOP taking these medications    apixaban 5 MG Tabs tablet Commonly known as: ELIQUIS   aspirin EC 81 MG tablet   clonazePAM 0.5 MG tablet Commonly known as: KLONOPIN   Entresto 24-26 MG Generic drug: sacubitril-valsartan   traMADol 50 MG tablet Commonly known as: ULTRAM       TAKE these medications    acetaminophen 325 MG tablet Commonly known as: TYLENOL Take 2 tablets (650 mg total) by mouth every 6 (six) hours as needed for mild pain (or Fever >/= 101).   allopurinol 300 MG tablet Commonly known as: ZYLOPRIM Take 1 tablet (300 mg total) by mouth daily.   ALPRAZolam 0.25 MG tablet Commonly known as: XANAX Take 1 tablet (0.25 mg total) by mouth 2 (two) times daily as needed for anxiety.   atorvastatin 80 MG tablet Commonly known as: LIPITOR Take 1 tablet (80 mg total) by mouth daily.   cefTRIAXone IVPB Commonly known as: ROCEPHIN Inject 2 g into the vein daily. Indication: Serratia hip prosthetic joint infection Last Day of Therapy: 05/03/2023 Labs - Once weekly:  CBC/D and BMP Labs - Once weekly: ESR and CRP Fax weekly labs to [phone] Please pull PIC at completion of IV antibiotics Method of administration: IV Push Method of administration may be changed at the discretion of  facility/pharmacy.   citalopram 20 MG tablet Commonly known as: CELEXA Take 1 tablet (20 mg total) by mouth daily.   clopidogrel 75 MG tablet Commonly known as: PLAVIX Take 1 tablet (75 mg total) by mouth daily.   melatonin 5 MG Tabs Take 1 tablet (5 mg total) by mouth at bedtime.   metoprolol tartrate 25 MG tablet Commonly known as: LOPRESSOR Take 1 tablet (25 mg total) by mouth 2 (two) times daily.   oxyCODONE 5 MG immediate release tablet Commonly known as: Oxy IR/ROXICODONE Take 1 tablet (5 mg total) by mouth every 6 (six) hours as needed for moderate pain (pain score 4-6). What changed: when to take this   potassium chloride 10 MEQ tablet Commonly known as: KLOR-CON Take 1 tablet (10 mEq total) by mouth daily.   torsemide 20 MG tablet Commonly known as: DEMADEX Take 1 tablet (20 mg total) by mouth daily.               Home Infusion Instuctions  (From admission, onward)           Start     Ordered   04/01/23 0000  Home infusion instructions       Question:  Instructions  Answer:  Flushing of vascular access device: 0.9% NaCl pre/post medication administration and prn patency; Heparin 100 u/ml, 5ml for implanted ports and Heparin 10u/ml, 5ml for all other central venous catheters.   04/01/23 1325            Follow-up Information     Dew, Marlow Baars, MD. Schedule an appointment as soon as possible for a visit.   Specialties: Vascular Surgery, Radiology, Interventional Cardiology Contact information: [address] [place] [address] [phone]         Rayburn Go, [place]. Schedule an appointment as soon as possible for a visit.   Specialty: Orthopedic Surgery Contact information: [address] [place] [address] [phone]                Discharge Exam: Ceasar Mons Weights   03/30/23 0500 03/31/23 0705 04/01/23 0500  Weight: 87.2 kg 85.2 kg 85.3 kg   General exam: awake, alert, no acute distress HEENT:  clear conjunctivae, moist mucus membranes, hearing grossly normal, small pupils Respiratory system: CTAB no wheezes, on room air, normal respiratory effort. Lungs clear Cardiovascular system: normal S1/S2, RRR Gastrointestinal system: soft, NT, ND Central nervous system: A&O x 3. no gross focal neurologic deficits, normal speech Extremities: Dressing clean and dry Skin: dry, intact, normal temperature Psychiatry: normal mood, congruent affect, judgement and insight appear normal    Condition at discharge: good    Discharge time spent:  40 minutes.  Signed: Loyce Dys, MD [facility] 04/01/2023   Patient's discharge was withheld yesterday until today as patient and her family made decision to go home with home health today.

## 2023-04-01 NOTE — TOC Progression Note (Signed)
Transition of Care [facility]) - Progression Note    Patient Details  Name: Brooke Davis MRN: [id number] Date of Birth: [birth year]  Transition of Care [facility]) CM/SW Contact  Garret Reddish, RN Phone Number: 04/01/2023, 3:11 PM  Clinical Narrative:     Chart reviewed.  SNF authorization has been approved.  Plan Auth ID- [id number], Berkley Harvey [id number].  Approved from 04-01-2023- 04-05-23.  Next review date is 09/16.  I have spoken with patient's daughter [name].  I have informed her that Mrs. Cuadras has been approved for SNF.  [name] reports that their has been some discussion about her mother going home with home health to assist with home infusion and PT/OT services.  [name] has spoken with her Mrs. Villafuerte and they would like to proceed with SNF placement at this time.  They have chosen [facility].    I have informed Tiffany, Admission Coordinator at [facility] that patient and her family has accepted bed offer. Tiffany informed me that Mrs. Jaffe Passur has expired and will need a update Passur prior to coming to the facility.  I have sent updated 30 day noted and H and P to [facility] as requested.    I have informed Dr. Meriam Sprague and patient's family that facility cannot accept patient today and will have to awaiting [place] Passr number prior to discharging to the facility.    Will plan a tentative discharge for SNF on 04-02-2023 pending Passur Number.    TOC will continue to follow for discharge planning.    Expected Discharge Plan: Skilled Nursing Facility Barriers to Discharge: English as a second language teacher, Continued Medical Work up  Expected Discharge Plan and Services   Discharge Planning Services: CM Consult   Living arrangements for the past 2 months: Single Family Home                                       Social Determinants of Health (SDOH) Interventions SDOH Screenings   Food Insecurity: No Food Insecurity (03/19/2023)  Housing: Low Risk  (03/19/2023)   Transportation Needs: No Transportation Needs (03/19/2023)  Utilities: Not At Risk (03/19/2023)  Financial Resource Strain: Patient Declined (11/05/2022)   Received from [facility], [facility]  Tobacco Use: Medium Risk (03/18/2023)    Readmission Risk Interventions    03/01/2023   10:20 AM  Readmission Risk Prevention Plan  Transportation Screening Complete  PCP or Specialist Appt within 3-5 Days Complete  HRI or Home Care Consult Complete  Social Work Consult for Recovery Care Planning/Counseling Complete  Palliative Care Screening Not Applicable  Medication Review Oceanographer) Complete

## 2023-04-01 NOTE — Progress Notes (Signed)
RE: Brooke Davis. Bergevin Date of Birth: January 16, 1941 Date: 04/01/2023     To Whom It May Concern:   Please be advised that the above-named patient will require a short-term nursing home stay - anticipated 30 days or less for rehabilitation and strengthening.  The plan is for return home

## 2023-04-01 NOTE — Progress Notes (Signed)
Subjective: 11 Days Post-Op Procedure(s) (LRB): RIGHT HIP IRRIGATION AND DEBRIDEMENT OF INFECTED TOTAL JOINT, EXCHANGE OF HEAD AND POLY COMPONENTS WITH PLACEMENT OF ANTIBIOTIC BEADS (Right) Patient reports that she is doing very well today. States that her pain is well controlled with the oral pain meds she is receiving, reports a 2/10 pain. Patient states that her bottom is been doing better as well.  Nursing staff has been very attentive and good about routine changes and application of creams. Denies any chest pain, shortness of breath, nausea, vomiting, fevers or chills.  We will continue with therapy today. Provider talked with PT who states that she is doing very well and could be good to go home. Plan is to go home or to rehab after hospital stay. Patient is stable for D/C, discussed the benefits of going home vs rehab stay with family.  Objective: Vital signs in last 24 hours: Temp:  [98 F (36.7 C)-98.6 F (37 C)] 98.3 F (36.8 C) (09/12 0808) Pulse Rate:  [70-74] 73 (09/12 0808) Resp:  [16-17] 16 (09/12 0808) BP: (118-138)/(39-54) 118/54 (09/12 0808) SpO2:  [96 %-100 %] 96 % (09/12 0808) Weight:  [85.3 kg] 85.3 kg (09/12 0500)  Intake/Output from previous day:  Intake/Output Summary (Last 24 hours) at 04/01/2023 1206 Last data filed at 04/01/2023 0545 Gross per 24 hour  Intake 300 ml  Output 1850 ml  Net -1550 ml    Intake/Output this shift: No intake/output data recorded.  Labs: Recent Labs    03/30/23 0447 03/30/23 1646 03/31/23 0523 04/01/23 0435  HGB 7.3* 8.2* 8.5* 8.1*   Recent Labs    03/31/23 0523 04/01/23 0435  WBC 9.7 10.0  RBC 2.69* 2.65*  HCT 25.6* 24.9*  PLT 299 306   Recent Labs    03/31/23 0523 04/01/23 0435  NA 137 137  K 3.5 3.2*  CL 103 99  CO2 28 28  BUN 53* 48*  CREATININE 1.18* 1.05*  GLUCOSE 133* 127*  CALCIUM 8.7* 8.8*   No results for input(s): "LABPT", "INR" in the last 72 hours.  EXAM General - Patient is Alert,  Appropriate, and Oriented.  Patient is hard of hearing, hearing aids Extremity - Neurologically intact Neurovascular intact Sensation intact distally Intact pulses distally Dorsiflexion/Plantar flexion intact No cellulitis present Compartment soft Dressing - Wound vac has been taken down. Incision looks good without any appreciable drainage or surrounding redness or swelling.  The incision was cleaned with alcohol and a new honeycomb dressing was applied. Motor Function - intact, moving foot and toes well on exam.  Patient able to plantar and dorsiflex with good strength and range of motion.  Patient has some difficulty performing SLR, but is able to with mild assist from provider, does report some increased pain.  Patient is neurovascularly intact down her right lower extremity to all dermatomes, posterior tibial pulses appreciated 2+.   Past Medical History:  Diagnosis Date   Anxiety    a.) on BZO (alprazolam) PRN   Arthritis    fingers, right hip   Atrial fibrillation (HCC) 11/10/2013   a.) CHA2DS2VASc = 5 (age x2, sex, HTN, T2DM);  b.) rate/rhythm maintained on oral metoprolol tartrate; chronically anticoagulated with apixaban   Bronchitis    Cerebrovascular small vessel disease (chronic)    Coronary artery disease involving native coronary artery of native heart 01/11/2021   a.) LHC 01/11/2021: 40% oLM, 50% mLCx, 30% oLCx, 30% pLAD, 30% mLAD, 99% pRCA (2.75 x 30 mm Resolute Onyx DES), 75% p-mRCA,  60% dRCA   DDD (degenerative disc disease), lumbar 12/01/2013   Diastolic dysfunction 01/12/2021   a.) TTE 01/12/2021 (s/p inf STEMI): EF 55-60%, mild-mod LAE, mild RAE, mod RVE, mild AR/MR, mod TR, G1DD   Diet-controlled type 2 diabetes mellitus (HCC)    Full dentures    Gout    History of bilateral cataract extraction 2021   Hyperlipidemia    Hypertension, essential    Long term current use of anticoagulant    a.) apixaban   PONV (postoperative nausea and vomiting)    Sinoatrial  node dysfunction (HCC) 11/10/2013   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) 01/11/2021   a.) LHC/PCI 01/11/2021 --> culprit lesion 99% pRCA --> 2.75 x 30 mm Resolute Onyx DES x 1   Varicose veins of legs    Wears hearing aid in both ears     Assessment/Plan: 11 Days Post-Op Procedure(s) (LRB): RIGHT HIP IRRIGATION AND DEBRIDEMENT OF INFECTED TOTAL JOINT, EXCHANGE OF HEAD AND POLY COMPONENTS WITH PLACEMENT OF ANTIBIOTIC BEADS (Right) Principal Problem:   Sepsis (HCC) Active Problems:   Primary hypertension   Hx of total hip arthroplasty, 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   Weight as of this encounter: 85.3 kg.  Her white cell count appears to have stabilized, at 10.0 today.  H&H has stabilized. At 8.1 today (slight decrease from 8.5 yesterday). 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  Switched to ceftriaxone.  PICC in place, plan for 6 weeks abx per ID then transition to oral abx for at least 3 months.  Patient will continue to work with physical therapy to work on postoperative PT ambulation, gait, ROM and strengthening of her right lower extremity.  Hip Preacutions discussed  Discussed with the patient continuing to utilize ice over the bandage  Woundvac taken down and a new honeycomb dressing was  applied.  Vascular and hospitalist team has suggested the patient be placed on Plavix/Eliquis. Eliquis still remains held per vascular surgery.  Continue on Plavix until follow-up outpatient  Weight-Bearing as tolerated to right leg  TOC placement has found rehab placement.  Discussed with PT, states that she may do well with at home care as she has good family support.  Patient is cleared to be discharged either home or to rehab, whatever family decides with inpatient medicine.  Patient will follow-up with [facility] clinic orthopedics in 2 weeks from surgery for reevaluation and staple removal  Danise Edge, PA-C [facility] 04/01/2023, 12:06 PM

## 2023-04-01 NOTE — Progress Notes (Signed)
Physical Therapy Treatment Patient Details Name: Brooke Davis MRN: [id number] DOB: 05/04/1941 Today's Date: 04/01/2023   History of Present Illness Pt is an 82 year old female s/p washout of R thigh collection with washout of total hip arthroplasty with poly exchange and head exchange; PMH significant for DM2, HTN, CAD s/p PCI and stent, dCHF A fib on Eliquis, hospitalized in July 2024 for elective hip replacement, again hospitalized in July 2024 with an acute embolic stroke while on Eliquis, with workup showing left carotid artery stenosis, s/p carotid stent placement on 02/25/23    PT Comments  Pt was long sitting in bed upon arrival. She is A and O. Agrees to session with a little encouragement. Overall demonstrating improving abilities and safety with OOB activity. She tolerated ambulation ~ 21ft with RW. Pt limited distance however vitals were stable. Encouraged pt to continue to perform HEP routinely. Acute PT will continue to follow and progress as able per current POC.    If plan is discharge home, recommend the following: A little help with walking and/or transfers;A little help with bathing/dressing/bathroom;Direct supervision/assist for medications management;Direct supervision/assist for financial management;Assist for transportation;Help with stairs or ramp for entrance;Assistance with cooking/housework     Equipment Recommendations  Other (comment) (Defer to next level of care)       Precautions / Restrictions Precautions Precautions: Posterior Hip;Fall Precaution Booklet Issued: Yes (comment) Precaution Comments: R hip wound vac Restrictions Weight Bearing Restrictions: Yes RLE Weight Bearing: Weight bearing as tolerated LLE Weight Bearing: Weight bearing as tolerated Other Position/Activity Restrictions: RLE WBAT     Mobility  Bed Mobility Overal bed mobility: Needs Assistance Bed Mobility: Supine to Sit  Supine to sit: Supervision, Used rails  General bed mobility  comments: increased time to perform    Transfers Overall transfer level: Needs assistance Equipment used: Rolling walker (2 wheels) Transfers: Sit to/from Stand Sit to Stand: Contact guard assist   Ambulation/Gait Ambulation/Gait assistance: Contact guard assist Gait Distance (Feet): 75 Feet Assistive device: Rolling walker (2 wheels) Gait Pattern/deviations: Step-to pattern Gait velocity: decreased        Balance Overall balance assessment: Needs assistance Sitting-balance support: No upper extremity supported, Feet supported Sitting balance-Leahy Scale: Good     Standing balance support: Bilateral upper extremity supported Standing balance-Leahy Scale: Fair Standing balance comment: reliant on RW during dynamic standing activity       Cognition Arousal: Alert Behavior During Therapy: WFL for tasks assessed/performed Overall Cognitive Status: Within Functional Limits for tasks assessed    General Comments: Pt was A and O x 4. able to recall 2/3 hip precautions           General Comments General comments (skin integrity, edema, etc.): reviewed wound vac instructions and need to continue to do HEP.      Pertinent Vitals/Pain Pain Assessment Pain Assessment: 0-10 Pain Score: 2  Pain Location: R hip Pain Descriptors / Indicators: Aching, Grimacing, Guarding Pain Intervention(s): Limited activity within patient's tolerance, Monitored during session, Premedicated before session, Repositioned     PT Goals (current goals can now be found in the care plan section) Acute Rehab PT Goals Patient Stated Goal: go home Progress towards PT goals: Progressing toward goals    Frequency    BID       AM-PAC PT "6 Clicks" Mobility   Outcome Measure  Help needed turning from your back to your side while in a flat bed without using bedrails?: None Help needed moving from lying on your  back to sitting on the side of a flat bed without using bedrails?: None Help needed  moving to and from a bed to a chair (including a wheelchair)?: A Little Help needed standing up from a chair using your arms (e.g., wheelchair or bedside chair)?: A Little Help needed to walk in hospital room?: A Little Help needed climbing 3-5 steps with a railing? : A Little 6 Click Score: 20    End of Session   Activity Tolerance: Patient tolerated treatment well Patient left: in chair;with call bell/phone within reach;with chair alarm set;with family/visitor present Nurse Communication: Mobility status;Precautions;Weight bearing status PT Visit Diagnosis: Other abnormalities of gait and mobility (R26.89);Unsteadiness on feet (R26.81);Muscle weakness (generalized) (M62.81);Difficulty in walking, not elsewhere classified (R26.2);Pain Pain - Right/Left: Right Pain - part of body: Hip     Time: 0840-0901 PT Time Calculation (min) (ACUTE ONLY): 21 min  Charges:    $Gait Training: 8-22 mins PT General Charges $$ ACUTE PT VISIT: 1 Visit                     Jetta Lout PTA 04/01/23, 9:16 AM

## 2023-04-01 NOTE — Progress Notes (Signed)
Daily Progress Note   Patient Name: Brooke Davis       Date: 04/01/2023 DOB: Jul 30, 1940  Age: 82 y.o. MRN#: [id number] Attending Physician: Loyce Dys, MD Primary Care Physician: [name], MD Admit Date: 03/18/2023  Reason for Consultation/Follow-up: Establishing goals of care  Subjective: Notes and labs reviewed.  In to see patient.  She is currently sitting in a bedside chair with daughters at bedside.  They states she has been able to walk with physical therapy and is feeling much better.  Currently plans are in place for patient to discharge to rehab.  Length of Stay: 14  Current Medications: Scheduled Meds:   acetaminophen  1,000 mg Oral Q8H   allopurinol  300 mg Oral Daily   atorvastatin  80 mg Oral Daily   Chlorhexidine Gluconate Cloth  6 each Topical Daily   citalopram  20 mg Oral Daily   clopidogrel  75 mg Oral Daily   feeding supplement  237 mL Oral TID BM   liver oil-zinc oxide   Topical BID   metoprolol tartrate  12.5 mg Oral BID   multivitamin with minerals  1 tablet Oral Daily   potassium chloride  40 mEq Oral Q4H   sodium chloride flush  10-40 mL Intracatheter Q12H   torsemide  20 mg Oral Daily    Continuous Infusions:  cefTRIAXone (ROCEPHIN)  IV 2 g (04/01/23 0924)    PRN Meds: ALPRAZolam, melatonin, morphine injection, ondansetron **OR** ondansetron (ZOFRAN) IV, oxyCODONE, senna, sodium chloride flush  Physical Exam Pulmonary:     Effort: Pulmonary effort is normal.  Neurological:     Mental Status: She is alert.             Vital Signs: BP (!) 118/54   Pulse 73   Temp 98.3 F (36.8 C)   Resp 16   Ht 5\' 2"  (1.575 m)   Wt 85.3 kg   SpO2 96%   BMI 34.40 kg/m  SpO2: SpO2: 96 % O2 Device: O2 Device: Room Air O2 Flow Rate:     Intake/output summary:  Intake/Output Summary (Last 24 hours) at 04/01/2023 1214 Last data filed at 04/01/2023 0545 Gross per 24 hour  Intake 300 ml  Output 1850 ml  Net -1550 ml   LBM: Last BM Date : 03/31/23 Baseline Weight: Weight:  81.2 kg Most recent weight: Weight: 85.3 kg    Patient Active Problem List   Diagnosis Date Noted   ABLA (acute blood loss anemia) 03/27/2023   Iron deficiency anemia 03/27/2023   Serratia infection 03/25/2023   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 02/2023 03/19/2023   CAD S/P percutaneous coronary angioplasty 03/19/2023   Generalized weakness 03/19/2023   Chronic anticoagulation 03/19/2023   Cellulitis of right hip 03/19/2023   Sepsis (HCC) 03/18/2023   Carotid stenosis, symptomatic, with infarction (HCC) 02/25/2023   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   Atrial fibrillation, chronic (HCC) 01/28/2023   Dyslipidemia 01/28/2023   Chronic diastolic CHF (congestive heart failure) (HCC) 01/28/2023   Gout 01/28/2023   Anxiety and depression 01/28/2023   Right arm weakness 01/28/2023   Acute CVA (cerebrovascular accident) (HCC) 01/28/2023   Bilateral carotid artery stenosis 01/28/2023   Hx of total hip arthroplasty, 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 01/18/2023   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   Slurred speech 09/23/2021   STEMI involving oth coronary artery of inferior wall (HCC) 01/11/2021   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) 01/11/2021   Controlled type 2 diabetes mellitus with hyperglycemia, without long-term current use of insulin (HCC) 01/11/2021   Pure hypercholesterolemia 01/11/2021   Hypercalcemia 01/11/2021   DDD (degenerative disc disease), lumbar 12/01/2013   Lumbar radiculitis 12/01/2013   Atrial fibrillation, transient (HCC) 11/10/2013   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   Primary hypertension 11/10/2013   Poorly-controlled hypertension 11/10/2013    Palliative Care Assessment & Plan    Recommendations/Plan: Patient working towards rehab placement. Goals set for DNR and up to 1 week of intubation if needed.  MOST form has been scanned into the ACP tab.  PMT will sign off at this time.  Please reconsult if needs arise.  Code Status:    Code Status Orders  (From admission, onward)           Start     Ordered   03/22/23 1522  Do not attempt resuscitation (DNR) Pre-Arrest Interventions Desired  (Code Status)  Continuous       Question Answer Comment  If pulseless and not breathing No CPR or chest compressions.   In Pre-Arrest Conditions (Patient Has Pulse and Is Breathing) May intubate, use advanced airway interventions and cardioversion/ACLS medications if appropriate or indicated. May transfer to ICU.   Consent: Discussion documented in EHR or advanced directives reviewed      03/22/23 1521           Code Status History     Date Active Date Inactive Code Status Order ID Comments User Context   03/19/2023 0116 03/22/2023 1521 Full Code [id number]  Andris Baumann, MD ED   02/25/2023 1027 03/01/2023 2011 Full Code [id number]  Annice Needy, MD Inpatient   02/02/2023 1509 02/10/2023 1529 Full Code [id number]  Charlton Amor, PA-C Inpatient   02/02/2023 1509 02/02/2023 1509 Full Code [id number]  Charlton Amor, PA-C Inpatient   01/27/2023 2049 02/02/2023 1508 Full Code [id number]  Mansy, Vernetta Honey, MD ED   01/18/2023 1255 01/19/2023 1625 Full Code [id number]  Donato Heinz, MD Inpatient   01/12/2021 0013 01/13/2021 1754 Full Code [id number]  Marlow Baars, MD Inpatient   01/11/2021 1301 01/12/2021 0013 Full Code [id number]  Marcina Millard, MD Inpatient       Thank you  for allowing the Palliative Medicine Team to assist in the care of this patient.    Morton Stall, NP  Please contact Palliative Medicine Team phone  at [phone] for questions and concerns.

## 2023-04-01 NOTE — Plan of Care (Signed)
  Problem: Fluid Volume: Goal: Hemodynamic stability will improve Outcome: Progressing   Problem: Clinical Measurements: Goal: Diagnostic test results will improve Outcome: Progressing Goal: Signs and symptoms of infection will decrease Outcome: Progressing   Problem: Respiratory: Goal: Ability to maintain adequate ventilation will improve Outcome: Progressing   Problem: Education: Goal: Knowledge of General Education information will improve Description: Including pain rating scale, medication(s)/side effects and non-pharmacologic comfort measures Outcome: Progressing   Problem: Health Behavior/Discharge Planning: Goal: Ability to manage health-related needs will improve Outcome: Progressing   Problem: Clinical Measurements: Goal: Ability to maintain clinical measurements within normal limits will improve Outcome: Progressing Goal: Will remain free from infection Outcome: Progressing Goal: Diagnostic test results will improve Outcome: Progressing Goal: Respiratory complications will improve Outcome: Progressing Goal: Cardiovascular complication will be avoided Outcome: Progressing   Problem: Activity: Goal: Risk for activity intolerance will decrease Outcome: Progressing   Problem: Nutrition: Goal: Adequate nutrition will be maintained Outcome: Progressing   Problem: Coping: Goal: Level of anxiety will decrease Outcome: Progressing   Problem: Elimination: Goal: Will not experience complications related to bowel motility Outcome: Progressing Goal: Will not experience complications related to urinary retention Outcome: Progressing   Problem: Pain Managment: Goal: General experience of comfort will improve Outcome: Progressing   Problem: Safety: Goal: Ability to remain free from injury will improve Outcome: Progressing   Problem: Skin Integrity: Goal: Risk for impaired skin integrity will decrease Outcome: Progressing   Problem: Education: Goal:  Knowledge of the prescribed therapeutic regimen will improve Outcome: Progressing Goal: Understanding of discharge needs will improve Outcome: Progressing Goal: Individualized Educational Video(s) Outcome: Progressing   Problem: Activity: Goal: Ability to avoid complications of mobility impairment will improve Outcome: Progressing Goal: Ability to tolerate increased activity will improve Outcome: Progressing   Problem: Clinical Measurements: Goal: Postoperative complications will be avoided or minimized Outcome: Progressing   Problem: Pain Management: Goal: Pain level will decrease with appropriate interventions Outcome: Progressing   Problem: Skin Integrity: Goal: Will show signs of wound healing Outcome: Progressing   Problem: Education: Goal: Knowledge of disease or condition will improve Outcome: Progressing Goal: Knowledge of secondary prevention will improve (MUST DOCUMENT ALL) Outcome: Progressing Goal: Knowledge of patient specific risk factors will improve [name] N/A or DELETE if not current risk factor) Outcome: Progressing   Problem: Ischemic Stroke/TIA Tissue Perfusion: Goal: Complications of ischemic stroke/TIA will be minimized Outcome: Progressing   Problem: Coping: Goal: Will verbalize positive feelings about self Outcome: Progressing Goal: Will identify appropriate support needs Outcome: Progressing   Problem: Health Behavior/Discharge Planning: Goal: Ability to manage health-related needs will improve Outcome: Progressing Goal: Goals will be collaboratively established with patient/family Outcome: Progressing   Problem: Self-Care: Goal: Ability to participate in self-care as condition permits will improve Outcome: Progressing Goal: Verbalization of feelings and concerns over difficulty with self-care will improve Outcome: Progressing Goal: Ability to communicate needs accurately will improve Outcome: Progressing   Problem: Nutrition: Goal:  Risk of aspiration will decrease Outcome: Progressing Goal: Dietary intake will improve Outcome: Progressing

## 2023-04-02 DIAGNOSIS — A419 Sepsis, unspecified organism: Secondary | ICD-10-CM | POA: Diagnosis not present

## 2023-04-02 DIAGNOSIS — T8450XA Infection and inflammatory reaction due to unspecified internal joint prosthesis, initial encounter: Secondary | ICD-10-CM | POA: Diagnosis not present

## 2023-04-02 DIAGNOSIS — I6523 Occlusion and stenosis of bilateral carotid arteries: Secondary | ICD-10-CM | POA: Diagnosis not present

## 2023-04-02 LAB — BASIC METABOLIC PANEL
Anion gap: 8 (ref 5–15)
BUN: 39 mg/dL — ABNORMAL HIGH (ref 8–23)
CO2: 29 mmol/L (ref 22–32)
Calcium: 8.8 mg/dL — ABNORMAL LOW (ref 8.9–10.3)
Chloride: 101 mmol/L (ref 98–111)
Creatinine, Ser: 1.06 mg/dL — ABNORMAL HIGH (ref 0.44–1.00)
GFR, Estimated: 53 mL/min — ABNORMAL LOW (ref >60)
Glucose, Bld: 117 mg/dL — ABNORMAL HIGH (ref 70–99)
Potassium: 3.6 mmol/L (ref 3.5–5.1)
Sodium: 138 mmol/L (ref 135–145)

## 2023-04-02 LAB — CBC WITH DIFFERENTIAL/PLATELET
Abs Immature Granulocytes: 0.03 10*3/uL (ref 0.00–0.07)
Basophils Absolute: 0.1 10*3/uL (ref 0.0–0.1)
Basophils Relative: 1 %
Eosinophils Absolute: 0.3 10*3/uL (ref 0.0–0.5)
Eosinophils Relative: 3 %
HCT: 25.2 % — ABNORMAL LOW (ref 36.0–46.0)
Hemoglobin: 8.3 g/dL — ABNORMAL LOW (ref 12.0–15.0)
Immature Granulocytes: 0 %
Lymphocytes Relative: 14 %
Lymphs Abs: 1.4 10*3/uL (ref 0.7–4.0)
MCH: 31.3 pg (ref 26.0–34.0)
MCHC: 32.9 g/dL (ref 30.0–36.0)
MCV: 95.1 fL (ref 80.0–100.0)
Monocytes Absolute: 0.6 10*3/uL (ref 0.1–1.0)
Monocytes Relative: 6 %
Neutro Abs: 7.4 10*3/uL (ref 1.7–7.7)
Neutrophils Relative %: 76 %
Platelets: 297 10*3/uL (ref 150–400)
RBC: 2.65 MIL/uL — ABNORMAL LOW (ref 3.87–5.11)
RDW: 19.7 % — ABNORMAL HIGH (ref 11.5–15.5)
WBC: 9.6 10*3/uL (ref 4.0–10.5)
nRBC: 0 % (ref 0.0–0.2)

## 2023-04-02 NOTE — TOC Transition Note (Signed)
Transition of Care [facility]) - CM/SW Discharge Note   Patient Details  Name: Brooke Davis MRN: [id number] Date of Birth: 02-05-1941  Transition of Care [facility]) CM/SW Contact:  Garret Reddish, RN Phone Number: 04/02/2023, 2:23 PM   Clinical Narrative:    Chart reviewed.  Spoke with patient's daughter [name] and she informs me that she has spoken with [name] and she will be going home today. She has declined to go to SNF at this time.  I have obtained a Passur number for [name] today.  Passur number is [phone].    I have spoken with [name] about home infusion services and home health services.  [name] did not have a preference. I have informed [name] that [facility] will provide home antibiotics for the patient.  I have also informed [name] that [name] with [facility] will come and teach patient and family how to complete home infusion at the bedside today. I have informed [name] that [facility] will provide home health services for home health RN for home infusion and PT and OT services.  I have informed [name] that [name] would start care on Monday September 16 th 2024.   Patient will receive IV Rocephin daily for 6 weeks at home.   PT reports that patient has all needed DME and no additional DME recommendations.  Patient's daughter will transport patient home today.    I have informed staff nurse of the above information.      Final next level of care: Home w Home Health Services Barriers to Discharge: No Barriers Identified   Patient Goals and CMS Choice CMS Medicare.gov Compare Post Acute Care list provided to:: Patient Choice offered to / list presented to : Patient, Adult Children  Discharge Placement                    Name of family member notified: [name] Patient and family notified of of transfer: 04/02/23  Discharge Plan and Services Additional resources added to the After Visit Summary for     Discharge Planning Services: CM Consult                       HH Arranged: RN, PT, OT [facility] Agency: [facility] ([facility] for home infusion.) Date HH Agency Contacted: 04/02/23 Time HH Agency Contacted: 1200 Representative spoke with at [facility]: [name] with [name] and [name] with [facility]  Social Determinants of Health (SDOH) Interventions SDOH Screenings   Food Insecurity: No Food Insecurity (03/19/2023)  Housing: Low Risk  (03/19/2023)  Transportation Needs: No Transportation Needs (03/19/2023)  Utilities: Not At Risk (03/19/2023)  Financial Resource Strain: Patient Declined (11/05/2022)   Received from [facility], [facility]  Tobacco Use: Medium Risk (03/18/2023)     Readmission Risk Interventions    03/01/2023   10:20 AM  Readmission Risk Prevention Plan  Transportation Screening Complete  PCP or Specialist Appt within 3-5 Days Complete  HRI or Home Care Consult Complete  Social Work Consult for Recovery Care Planning/Counseling Complete  Palliative Care Screening Not Applicable  Medication Review Oceanographer) Complete

## 2023-04-02 NOTE — Progress Notes (Signed)
Physical Therapy Treatment Patient Details Name: Brooke Davis MRN: [id number] DOB: August 19, 1940 Today's Date: 04/02/2023   History of Present Illness Pt is an 82 year old female s/p washout of R thigh collection with washout of total hip arthroplasty with poly exchange and head exchange; PMH significant for DM2, HTN, CAD s/p PCI and stent, dCHF A fib on Eliquis, hospitalized in July 2024 for elective hip replacement, again hospitalized in July 2024 with an acute embolic stroke while on Eliquis, with workup showing left carotid artery stenosis, s/p carotid stent placement on 02/25/23    PT Comments  Pt was seen 2x this date for PT in prep for Dcing home. PT ambulated around RN station ~ 200 ft in first session and then demonstrated safe performance of ascending/descending 1 step to simulate pt getting in/out of care and elevated bd home setup. Pt is doing well overall and progressing towards all PT goals. Highly recommend continued skilled PT at DC to maximize her independence and safety with all ADLs. Pt will have 24/7 assistance at DC. Both pt and pt's daughter state confidence in safe DC home later this date. All equipment needs have been met.   If plan is discharge home, recommend the following: A little help with walking and/or transfers;A little help with bathing/dressing/bathroom;Direct supervision/assist for medications management;Direct supervision/assist for financial management;Assist for transportation;Help with stairs or ramp for entrance;Assistance with cooking/housework     Equipment Recommendations  None recommended by PT       Precautions / Restrictions Precautions Precautions: Posterior Hip;Fall Precaution Booklet Issued: Yes (comment) Restrictions Weight Bearing Restrictions: Yes RLE Weight Bearing: Weight bearing as tolerated LLE Weight Bearing: Weight bearing as tolerated Other Position/Activity Restrictions: RLE WBAT     Mobility  Bed Mobility Overal bed mobility:  Needs Assistance Bed Mobility: Supine to Sit, Sit to Supine  Supine to sit: Supervision Sit to supine: Min assist   Transfers Overall transfer level: Needs assistance Equipment used: Rolling walker (2 wheels) Transfers: Sit to/from Stand Sit to Stand: Supervision    General transfer comment: no physical assistance required to stand or sit    Ambulation/Gait Ambulation/Gait assistance: Supervision Gait Distance (Feet): 50 Feet Assistive device: Rolling walker (2 wheels) Gait Pattern/deviations: Step-through pattern Gait velocity: decreased     General Gait Details: pt was able to safely ambulate lap round RN station without LOB   Stairs Stairs: Yes Stairs assistance: Contact guard assist Stair Management: No rails, Step to pattern, Forwards, With walker Number of Stairs: 1 General stair comments: pt performed ascending/descending 1 step to simulate getting in/out of bed and in/out of care via    Balance Overall balance assessment: Needs assistance Sitting-balance support: No upper extremity supported, Feet supported Sitting balance-Leahy Scale: Good     Standing balance support: Bilateral upper extremity supported, During functional activity, Reliant on assistive device for balance Standing balance-Leahy Scale: Fair       Cognition Arousal: Alert Behavior During Therapy: WFL for tasks assessed/performed Overall Cognitive Status: Within Functional Limits for tasks assessed         General Comments General comments (skin integrity, edema, etc.): Reviewed DC instructions and expectation of post acute care. encouraged routine mobility and continued performance of HEP. Pt is cleared form an acute PT standpoint for safe DC hoem with 24/7 support form family      Pertinent Vitals/Pain Pain Assessment Pain Assessment: No/denies pain Pain Score: 0-No pain           PT Goals (current goals can  now be found in the care plan section) Acute Rehab PT Goals Patient  Stated Goal: go home Progress towards PT goals: Progressing toward goals    Frequency    BID       AM-PAC PT "6 Clicks" Mobility   Outcome Measure  Help needed turning from your back to your side while in a flat bed without using bedrails?: None Help needed moving from lying on your back to sitting on the side of a flat bed without using bedrails?: None Help needed moving to and from a bed to a chair (including a wheelchair)?: A Little Help needed standing up from a chair using your arms (e.g., wheelchair or bedside chair)?: A Little Help needed to walk in hospital room?: A Little Help needed climbing 3-5 steps with a railing? : A Little 6 Click Score: 20    End of Session Equipment Utilized During Treatment: Gait belt (issued gait belt for home use) Activity Tolerance: Patient tolerated treatment well Patient left: in bed;with call bell/phone within reach;with bed alarm set;with family/visitor present Nurse Communication: Mobility status;Precautions;Weight bearing status PT Visit Diagnosis: Other abnormalities of gait and mobility (R26.89);Unsteadiness on feet (R26.81);Muscle weakness (generalized) (M62.81);Difficulty in walking, not elsewhere classified (R26.2);Pain Pain - Right/Left: Right Pain - part of body: Hip     Time: 8469-6295 PT Time Calculation (min) (ACUTE ONLY): 18 min  Charges:    $Gait Training: 8-22 mins PT General Charges $$ ACUTE PT VISIT: 1 Visit                     Jetta Lout PTA 04/02/23, 12:05 PM

## 2023-04-02 NOTE — Progress Notes (Signed)
Progress Note   Patient: Brooke Davis:[id number] DOB: 20-Oct-1940 DOA: 03/18/2023     15 DOS: the patient was seen and examined on 04/02/2023      Subjective: I had extensive discussion with patient's as well as the family They have agreed for discharge home with home health Physical therapist working with patient and doing much better Case manager is working on arranging home IV infusion as well as home health   Brief Hospital course: 82 y.o. female with medical history significant for DM2, HTN, CAD s/p PCI and stent on Plavix, dCHF, chronic A fib on Eliquis, and chronic anxiety, hospitalized in July 2024 with an acute embolic stroke while on Eliquis, with workup showing left carotid artery stenosis s/p L carotid stent placement on 02/25/23 and undergoing total right hip replacement a couple weeks later on 03/09/23,  who presents to the ED with a 3-day history of fever, nausea, dizziness, diarrhea, R hip pain and swelling.     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  Status post right hip irrigation and debridement on 03/21/2023 by orthopedic surgery Dr. Clemencia Course.  Infectious disease consulted and following.   Joint fluid culture growing rare Serratia Marcences, IV antibiotics de-escalated from cefepime and IV vancomycin to Rocephin on 03/23/2023, under the guidance of infectious disease.   Further hospital course and management as outlined below.      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 Clemencia Course on 03/21/2023. Joint fluid culture growing rare Serratia Marcences, IV antibiotics de-escalated from cefepime and IV vancomycin to Rocephin on 03/23/2023, under the guidance of infectious disease. Continue current analgesics Appreciate infectious disease assistance. Will remain on IV Rocephin  until 10/14, OPAT orders placed Follow up with Infectious Disease in 4-6 weeks PICC line placed 9/4 Follow orthopedics recommendation below: --WBAT on RLE --Hip precautions --Wound vac applied 9/8 --JP drain removed  --Follow up at [facility] in 2 weeks   Acute blood loss postsurgery  Iron defiicency Hemoglobin 7.5 from 9.2, 1 unit PRBCs transfused Maintain hemoglobin above 8.0 9/7 -- Hbg dropped 8.3 >> 7.1 with bleeding from hip incision site. Transfused 1 unit RBC's 9/8 -- Hbg improved 7.4 >> 8.1. No bleeding. 9/9 -- Hbg 7.6 no signs of bleeding 9/10 -- Hbg 7.3. Transfuse 1 unit RBC's --Repeat Hbg after transfusion -- Continue to hold Eliquis -- Continue Plavix given new carotid stent one month ago --Defer iron infusion with active infection -Continue-Holding initiation of PO iron for now to avoid dark stools while we monitor for bleeding   Leukocytosis -- WBC was 9.5 >> 10, then increased 9/7 and trended up 13.6 >> 13.9 >> 16.1.  Improved WBC 11.0 today Suspect reactive due to the bleeding she had, then uncontrolled pain and wound vac placement.   Pt has remained afebrile.   --Monitor fever curve,  CBC and clinically for s/sx's of infection -- Continue on IV antibiotics for PJI as outlined, per Infectious Disease -Orthopedics following we appreciate input --Additional surgical washout could be required if signs of worsening infection or WBC continues to rise.   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 Ortho continues to follow.   Hypokalemia - probably from several loose stools from bowel regimen.   --replace PO Kcl for K 3.3 yesterday Continue to monitor potassium levels closely   Hypomagnesemia -replaced & resolved. 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   Curb sided with vascular surgery, Dr. Wyn Quaker, recommended to restart Eliquis and Plavix within 48 hours of I&D done on 03/21/2023, which was done. Pt developed bleeding from hip surgical site, required blood transfusions this week. --Continue with Plavix --HOLD ELIQUIS (9/7), resume when anemia improves and bleeding resolves -- I have discussed the case with Dr. Wyn Quaker vascular surgeon who recommended Plavix monotherapy until patient follows up with him at the clinic for consideration of reinitiating Eliquis at that time.   Chronic diastolic CHF (congestive heart failure) (HCC) Euvolemic on exam. Continue torsemide Hold Entresto - resume when BP's will tolerate (very soft diastolic BP's) Continue strict I's and O's and daily weight   Atrial fibrillation, chronic (HCC) Chronic anticoagulation Currently rate controlled HOLD Eliquis (9/7 - hip bleeding, Hbg dropped to 7.1) Metoprolol initially held, now resumed at lower dose 12.5 mg BID   CAD S/P percutaneous coronary angioplasty Continue atorvastatin and Plavix   Anxiety and depression Continue citalopram and alprazolam   Primary hypertension BP's overall stable with soft DBP's On metoprolol, reduced dose Resumed on home torsemide Continue to closely monitor vital signs.   CKD stage IIIa - renal function stable. Monitor BMP       DVT prophylaxis: on Plavix /  Eliquis held due to bleeding and progressive anemia  requiring blood transfusion 9/7      Code Status: Currently DNR per the patient.  Short-term intubation okay.  No CPR / chest compressions.  See code status order for partial code limitations.   Family Communication: daughters at bedside on rounds, updated   Disposition Plan: Home health     Consultants:  Orthopedics Curb sided with vascular surgery, Dr. Wyn Quaker. Infectious disease.         Examination:   General exam: awake, alert, no acute distress HEENT: clear conjunctivae, moist mucus membranes, hearing grossly normal, small pupils Respiratory system: CTAB no wheezes, on room air, normal respiratory effort. Lungs clear Cardiovascular system: normal S1/S2, RRR Gastrointestinal system: soft, NT, ND Central nervous system: A&O x 3. no gross focal neurologic deficits, normal speech Extremities: wound vac in place to right hip with serosanguinous fluid in canister Skin: dry, intact, normal temperature Psychiatry: normal mood, congruent affect, judgement and insight appear normal       Data Reviewed I have reviewed patient's labs as well as orthopedics documentation as well as vitals below    Latest Ref Rng & Units 04/02/2023    5:17 AM 04/01/2023    4:35 AM 03/31/2023    5:23 AM  CBC  WBC 4.0 - 10.5 K/uL 9.6  10.0  9.7   Hemoglobin 12.0 - 15.0 g/dL 8.3  8.1  8.5  Hematocrit 36.0 - 46.0 % 25.2  24.9  25.6   Platelets 150 - 400 K/uL 297  306  299     basic metabolic panel  Vitals:   04/01/23 2141 04/02/23 0034 04/02/23 0500 04/02/23 0804  BP: (!) 134/52 (!) 116/52  (!) 154/73  Pulse: 72 66  82  Resp:  18  17  Temp:  98.4 F (36.9 C)  98.5 F (36.9 C)  TempSrc:      SpO2:  97%  100%  Weight:   87.5 kg   Height:         Author: Loyce Dys, MD 04/02/2023 1:13 PM  For on call review [email or web address].

## 2023-04-02 NOTE — Progress Notes (Signed)
Subjective: 12 Days Post-Op Procedure(s) (LRB): RIGHT HIP IRRIGATION AND DEBRIDEMENT OF INFECTED TOTAL JOINT, EXCHANGE OF HEAD AND POLY COMPONENTS WITH PLACEMENT OF ANTIBIOTIC BEADS (Right) Patient is initially asleep during provider visit.  Patient reports that she is doing very well today. States that her pain is well controlled with the oral pain meds she is receiving, reports a 2/10 pain. Patient states that her bottom is been doing better as well.  Nursing staff has been very attentive and good about routine changes and application of creams. Denies any chest pain, shortness of breath, nausea, vomiting, fevers or chills.  We will continue with therapy today. Plan is to go to rehab after her hospital stay.  TOC had a conversation with family yesterday about either going home or rehab.  Family and patient decided to go with rehab.  Look to discharge to [facility] today  Objective: Vital signs in last 24 hours: Temp:  [98.3 F (36.8 C)-98.4 F (36.9 C)] 98.4 F (36.9 C) (09/13 0034) Pulse Rate:  [66-73] 66 (09/13 0034) Resp:  [16-18] 18 (09/13 0034) BP: (116-134)/(51-54) 116/52 (09/13 0034) SpO2:  [96 %-100 %] 97 % (09/13 0034) Weight:  [87.5 kg] 87.5 kg (09/13 0500)  Intake/Output from previous day:  Intake/Output Summary (Last 24 hours) at 04/02/2023 0750 Last data filed at 04/01/2023 1900 Gross per 24 hour  Intake 240 ml  Output --  Net 240 ml    Intake/Output this shift: No intake/output data recorded.  Labs: Recent Labs    03/30/23 1646 03/31/23 0523 04/01/23 0435 04/02/23 0517  HGB 8.2* 8.5* 8.1* 8.3*   Recent Labs    04/01/23 0435 04/02/23 0517  WBC 10.0 9.6  RBC 2.65* 2.65*  HCT 24.9* 25.2*  PLT 306 297   Recent Labs    04/01/23 0435 04/02/23 0517  NA 137 138  K 3.2* 3.6  CL 99 101  CO2 28 29  BUN 48* 39*  CREATININE 1.05* 1.06*  GLUCOSE 127* 117*  CALCIUM 8.8* 8.8*   No results for input(s): "LABPT", "INR" in the last 72  hours.  EXAM General - Patient is Alert, Appropriate, and Oriented.  Patient is hard of hearing, hearing aids Extremity - Neurologically intact Neurovascular intact Sensation intact distally Intact pulses distally Dorsiflexion/Plantar flexion intact No cellulitis present Compartment soft Dressing -honeycomb dressing intact without any appreciable drainage.  Incision looks good without surrounding redness or swelling. Motor Function - intact, moving foot and toes well on exam.  Patient able to plantar and dorsiflex with good strength and range of motion.  Patient has some difficulty performing SLR, but is able to with mild assist from provider, does report some increased pain.  Patient is neurovascularly intact down her right lower extremity to all dermatomes, posterior tibial pulses appreciated 2+.   Past Medical History:  Diagnosis Date   Anxiety    a.) on BZO (alprazolam) PRN   Arthritis    fingers, right hip   Atrial fibrillation (HCC) 11/10/2013   a.) CHA2DS2VASc = 5 (age x2, sex, HTN, T2DM);  b.) rate/rhythm maintained on oral metoprolol tartrate; chronically anticoagulated with apixaban   Bronchitis    Cerebrovascular small vessel disease (chronic)    Coronary artery disease involving native coronary artery of native heart 01/11/2021   a.) LHC 01/11/2021: 40% oLM, 50% mLCx, 30% oLCx, 30% pLAD, 30% mLAD, 99% pRCA (2.75 x 30 mm Resolute Onyx DES), 75% p-mRCA, 60% dRCA   DDD (degenerative disc disease), lumbar 12/01/2013   Diastolic dysfunction 01/12/2021  a.) TTE 01/12/2021 (s/p inf STEMI): EF 55-60%, mild-mod LAE, mild RAE, mod RVE, mild AR/MR, mod TR, G1DD   Diet-controlled type 2 diabetes mellitus (HCC)    Full dentures    Gout    History of bilateral cataract extraction 2021   Hyperlipidemia    Hypertension, essential    Long term current use of anticoagulant    a.) apixaban   PONV (postoperative nausea and vomiting)    Sinoatrial node dysfunction (HCC) 11/10/2013    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) 01/11/2021   a.) LHC/PCI 01/11/2021 --> culprit lesion 99% pRCA --> 2.75 x 30 mm Resolute Onyx DES x 1   Varicose veins of legs    Wears hearing aid in both ears     Assessment/Plan: 12 Days Post-Op Procedure(s) (LRB): RIGHT HIP IRRIGATION AND DEBRIDEMENT OF INFECTED TOTAL JOINT, EXCHANGE OF HEAD AND POLY COMPONENTS WITH PLACEMENT OF ANTIBIOTIC BEADS (Right) Principal Problem:   Sepsis (HCC) Active Problems:   Primary hypertension   Hx of total hip arthroplasty, 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   Weight as of this encounter: 87.5 kg.  Her white cell count appears to have stabilized, at 9.6 today.  H&H has stabilized. At 8.3 today  Continue with continued perineal care and cream application  ID has been consulted, found to have pansensitive Serratia macrcescens.  Switched to ceftriaxone.  PICC in place, plan for 6 weeks abx per ID then transition to oral abx for at least 3 months.  Patient will continue to work with physical therapy to work on postoperative PT ambulation, gait, ROM and strengthening of her right lower extremity.  Hip Preacutions discussed  Discussed with the patient continuing to utilize ice over the bandage  Vascular and hospitalist team has suggested the patient be placed on Plavix/Eliquis. Eliquis still remains held per vascular surgery.  Continue on Plavix until  follow-up outpatient  Weight-Bearing as tolerated to right leg  TOC placement has found rehab placement.  Patient is clear for DC to [facility].  Patient will follow-up with [facility] orthopedics on 04/08/2023 for reevaluation and staple removal  Danise Edge, PA-C [facility] 04/02/2023, 7:50 AM

## 2023-04-02 NOTE — Care Management Important Message (Signed)
Important Message  Patient Details  Name: Brooke Davis MRN: [id number] Date of Birth: 04-13-1941   Medicare Important Message Given:  Yes     Olegario Messier A Raschelle Wisenbaker 04/02/2023, 10:56 AM

## 2023-04-06 ENCOUNTER — Other Ambulatory Visit (INDEPENDENT_AMBULATORY_CARE_PROVIDER_SITE_OTHER): Payer: Self-pay | Admitting: Vascular Surgery

## 2023-04-06 DIAGNOSIS — I779 Disorder of arteries and arterioles, unspecified: Secondary | ICD-10-CM

## 2023-04-07 ENCOUNTER — Ambulatory Visit (INDEPENDENT_AMBULATORY_CARE_PROVIDER_SITE_OTHER): Payer: Medicare Other

## 2023-04-07 ENCOUNTER — Ambulatory Visit (INDEPENDENT_AMBULATORY_CARE_PROVIDER_SITE_OTHER): Payer: Medicare Other | Admitting: Nurse Practitioner

## 2023-04-07 ENCOUNTER — Encounter (INDEPENDENT_AMBULATORY_CARE_PROVIDER_SITE_OTHER): Payer: Self-pay | Admitting: Nurse Practitioner

## 2023-04-07 VITALS — BP 140/57 | HR 65 | Resp 16

## 2023-04-07 DIAGNOSIS — I6523 Occlusion and stenosis of bilateral carotid arteries: Secondary | ICD-10-CM

## 2023-04-07 DIAGNOSIS — I779 Disorder of arteries and arterioles, unspecified: Secondary | ICD-10-CM

## 2023-04-07 DIAGNOSIS — I10 Essential (primary) hypertension: Secondary | ICD-10-CM

## 2023-04-07 NOTE — Progress Notes (Signed)
Subjective:    Patient ID: Brooke Davis, female    DOB: [birth year], 82 y.o.   MRN: [id number] Chief Complaint  Patient presents with   Follow-up    [facility] 4 week with carotid    The patient is seen for follow up evaluation of carotid stenosis status post left carotid stent on 02/25/2023.  There were no post operative problems or complications related to the surgery.  The patient denies neck or incisional pain.  Unfortunately shortly after her carotid stent placement the patient became ill and was found to be septic due to a right hip replacement.  She subsequently was readmitted to the hospital for debridement of the hip joint.  During that hospitalization she became anemic and she is continued on Plavix but her Eliquis is being held until her hemoglobin stabilizes  The patient denies headache.  The patient is taking enteric-coated aspirin 81 mg daily.  No recent shortening of the patient's walking distance or new symptoms consistent with claudication.  No history of rest pain symptoms. No new ulcers or wounds of the lower extremities have occurred.  There is no history of DVT, PE or superficial thrombophlebitis. No recent episodes of angina or shortness of breath documented.   Today noninvasive studies show 1 to 39% stenosis of the bilateral internal carotid arteries.     Review of Systems  Cardiovascular:  Positive for leg swelling.  Musculoskeletal:  Positive for arthralgias and gait problem.  Neurological:  Positive for weakness.  All other systems reviewed and are negative.      Objective:   Physical Exam Vitals reviewed.  HENT:     Head: Normocephalic.  Neck:     Vascular: No carotid bruit.  Cardiovascular:     Rate and Rhythm: Normal rate.  Pulmonary:     Effort: Pulmonary effort is normal.  Skin:    General: Skin is warm and dry.  Neurological:     Mental Status: She is alert and oriented to person, place, and time.  Psychiatric:        Mood and Affect:  Mood normal.        Behavior: Behavior normal.        Thought Content: Thought content normal.        Judgment: Judgment normal.     BP (!) 140/57 (BP Location: Left Arm)   Pulse 65   Resp 16   Past Medical History:  Diagnosis Date   Anxiety    a.) on BZO (alprazolam) PRN   Arthritis    fingers, right hip   Atrial fibrillation (HCC) 11/10/2013   a.) CHA2DS2VASc = 5 (age x2, sex, HTN, T2DM);  b.) rate/rhythm maintained on oral metoprolol tartrate; chronically anticoagulated with apixaban   Bronchitis    Cerebrovascular small vessel disease (chronic)    Coronary artery disease involving native coronary artery of native heart 01/11/2021   a.) LHC 01/11/2021: 40% oLM, 50% mLCx, 30% oLCx, 30% pLAD, 30% mLAD, 99% pRCA (2.75 x 30 mm Resolute Onyx DES), 75% p-mRCA, 60% dRCA   DDD (degenerative disc disease), lumbar 12/01/2013   Diastolic dysfunction 01/12/2021   a.) TTE 01/12/2021 (s/p inf STEMI): EF 55-60%, mild-mod LAE, mild RAE, mod RVE, mild AR/MR, mod TR, G1DD   Diet-controlled type 2 diabetes mellitus (HCC)    Full dentures    Gout    History of bilateral cataract extraction 2021   Hyperlipidemia    Hypertension, essential    Long term current use of anticoagulant  a.) apixaban   PONV (postoperative nausea and vomiting)    Sinoatrial node dysfunction (HCC) 11/10/2013   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) 01/11/2021   a.) LHC/PCI 01/11/2021 --> culprit lesion 99% pRCA --> 2.75 x 30 mm Resolute Onyx DES x 1   Varicose veins of legs    Wears hearing aid in both ears     Social History   Socioeconomic History   Marital status: Widowed    Spouse name: Not on file   Number of children: 4   Years of education: Not on file   Highest education level: Not on file  Occupational History   Not on file  Tobacco Use   Smoking status: Former    Current packs/day: 0.00    Types: Cigarettes    Quit date: 1980    Years since quitting: 44.7    Smokeless tobacco: Never  Vaping Use   Vaping status: Never Used  Substance and Sexual Activity   Alcohol use: Not Currently   Drug use: Never   Sexual activity: Not on file  Other Topics Concern   Not on file  Social History Narrative   Not on file   Social Determinants of Health   Financial Resource Strain: Patient Declined (11/05/2022)   Received from [facility], [facility]   Overall Financial Resource Strain (CARDIA)    Difficulty of Paying Living Expenses: Patient declined  Food Insecurity: No Food Insecurity (03/19/2023)   Hunger Vital Sign    Worried About Running Out of Food in the Last Year: Never true    Ran Out of Food in the Last Year: Never true  Transportation Needs: No Transportation Needs (03/19/2023)   PRAPARE - Administrator, Civil Service (Medical): No    Lack of Transportation (Non-Medical): No  Physical Activity: Not on file  Stress: Not on file  Social Connections: Not on file  Intimate Partner Violence: Not At Risk (03/19/2023)   Humiliation, Afraid, Rape, and Kick questionnaire    Fear of Current or Ex-Partner: No    Emotionally Abused: No    Physically Abused: No    Sexually Abused: No    Past Surgical History:  Procedure Laterality Date   BREAST BIOPSY Left 2011   NEG   CARDIAC CATHETERIZATION     over 20 yrs ago.  All OK.   CAROTID PTA/STENT INTERVENTION Left 02/25/2023   Procedure: CAROTID PTA/STENT INTERVENTION;  Surgeon: Annice Needy, MD;  Location: [facility] INVASIVE CV [facility];  Service: Cardiovascular;  Laterality: Left;   CATARACT EXTRACTION W/PHACO Left 03/27/2020   Procedure: CATARACT EXTRACTION PHACO AND INTRAOCULAR LENS PLACEMENT (IOC) LEFT 7.49  00:56.4  13.3%;  Surgeon: Lockie Mola, MD;  Location: [facility];  Service: Ophthalmology;  Laterality: Left;   CATARACT EXTRACTION W/PHACO Right 04/24/2020   Procedure: CATARACT EXTRACTION PHACO AND INTRAOCULAR LENS PLACEMENT (IOC)  RIGHT;  Surgeon: Lockie Mola, MD;  Location: [facility];  Service: Ophthalmology;  Laterality: Right;  8.34 0:50.2 16.6%   CORONARY/GRAFT ACUTE MI REVASCULARIZATION N/A 01/11/2021   Procedure: Coronary/Graft Acute MI Revascularization;  Surgeon: Marcina Millard, MD;  Location: [facility] INVASIVE CV [facility];  Service: Cardiovascular;  Laterality: N/A;   INCISION AND DRAINAGE Right 03/21/2023   Procedure: RIGHT HIP IRRIGATION AND DEBRIDEMENT OF INFECTED TOTAL JOINT, EXCHANGE OF HEAD AND POLY COMPONENTS WITH PLACEMENT OF ANTIBIOTIC BEADS;  Surgeon: Campbell Stall, MD;  Location: [facility] ORS;  Service: Orthopedics;  Laterality: Right;  LEFT HEART CATH AND CORONARY ANGIOGRAPHY N/A 01/11/2021   Procedure: LEFT HEART CATH AND CORONARY ANGIOGRAPHY;  Surgeon: Marcina Millard, MD;  Location: [facility] INV[facility] CV [facility];  Service: Cardiovascular;  Laterality: N/A;   PARTIAL HYSTERECTOMY     TOE SURGERY Left    great toe; rod in place   TOTAL HIP ARTHROPLASTY Right 01/18/2023   Procedure: TOTAL HIP ARTHROPLASTY;  Surgeon: Donato Heinz, MD;  Location: [facility] ORS;  Service: Orthopedics;  Laterality: Right;   TOTAL KNEE ARTHROPLASTY Right 2007    Family History  Problem Relation Age of Onset   Heart attack Mother 95       required open heart surgery. Onset age is unknown.   Stroke Father    Diabetes Father    Alcoholism Brother    Breast cancer Neg Hx     Allergies  Allergen Reactions   Celebrex [Celecoxib] Diarrhea   Penicillins Itching    IgE = 154 (WNL) on 01/05/2023   Relafen [Nabumetone]     Palpitation    Lovastatin Diarrhea       Latest Ref Rng & Units 04/02/2023    5:17 AM 04/01/2023    4:35 AM 03/31/2023    5:23 AM  CBC  WBC 4.0 - 10.5 K/uL 9.6  10.0  9.7   Hemoglobin 12.0 - 15.0 g/dL 8.3  8.1  8.5   Hematocrit 36.0 - 46.0 % 25.2  24.9  25.6   Platelets 150 - 400 K/uL 297  306  299       CMP     Component Value Date/Time   NA 138 04/02/2023 0517   K 3.6  04/02/2023 0517   CL 101 04/02/2023 0517   CO2 29 04/02/2023 0517   GLUCOSE 117 (H) 04/02/2023 0517   BUN 39 (H) 04/02/2023 0517   CREATININE 1.06 (H) 04/02/2023 0517   CALCIUM 8.8 (L) 04/02/2023 0517   PROT 6.5 03/18/2023 2216   ALBUMIN 2.9 (L) 03/18/2023 2216   AST 19 03/18/2023 2216   ALT 12 03/18/2023 2216   ALKPHOS 85 03/18/2023 2216   BILITOT 0.9 03/18/2023 2216   GFRNONAA 53 (L) 04/02/2023 0517     No results found.     Assessment & Plan:   1. Bilateral carotid artery stenosis Recommend:  The patient is s/p successful 1-39% carotid stent  Duplex ultrasound  shows 1-39%  stenosis.  Continue dual antiplatelet therapy as prescribed for a minimum of 6 months Continue management of CAD, HTN and Hyperlipidemia Healthy heart diet,  encouraged exercise at least 4 times per week  The patient's NIHSS score is as follows: 3 Mild: 1 - 5 Mild to Moderately Severe: 5 - 14 Severe: 15 - 24 Very Severe: >25  Follow up in 3 months with duplex ultrasound and physical exam based on the patient's carotid surgery  Patient continue with Plavix for now prior to restarting the Eliquis.  We will plan on restarting Eliquis when her hemoglobin improves and stabilizes somewhat.  She notes that she had blood work drawn recently by home health and we will monitor for the results but if they receive them they are asked to contact our office.  2. Primary hypertension Continue antihypertensive medications as already ordered, these medications have been reviewed and there are no changes at this time.     Current Outpatient Medications on File Prior to Visit  Medication Sig Dispense Refill   acetaminophen (TYLENOL) 325 MG tablet Take 2 tablets (650 mg total) by mouth every  6 (six) hours as needed for mild pain (or Fever >/= 101).     allopurinol (ZYLOPRIM) 300 MG tablet Take 1 tablet (300 mg total) by mouth daily. 30 tablet 0   ALPRAZolam (XANAX) 0.25 MG tablet Take 1 tablet (0.25 mg total)  by mouth 2 (two) times daily as needed for anxiety. 5 tablet 0   atorvastatin (LIPITOR) 80 MG tablet Take 1 tablet (80 mg total) by mouth daily. 30 tablet 11   cefTRIAXone (ROCEPHIN) IVPB Inject 2 g into the vein daily. Indication: Serratia hip prosthetic joint infection Last Day of Therapy: 05/03/2023 Labs - Once weekly:  CBC/D and BMP Labs - Once weekly: ESR and CRP Fax weekly labs to [phone] Please pull PIC at completion of IV antibiotics Method of administration: IV Push Method of administration may be changed at the discretion of facility/pharmacy. 39 Units 0   citalopram (CELEXA) 20 MG tablet Take 1 tablet (20 mg total) by mouth daily. 30 tablet 0   clopidogrel (PLAVIX) 75 MG tablet Take 1 tablet (75 mg total) by mouth daily. 30 tablet 6   metoprolol tartrate (LOPRESSOR) 25 MG tablet Take 1 tablet (25 mg total) by mouth 2 (two) times daily. 60 tablet 0   oxyCODONE (OXY IR/ROXICODONE) 5 MG immediate release tablet Take 1 tablet (5 mg total) by mouth every 6 (six) hours as needed for moderate pain (pain score 4-6). 10 tablet 0   potassium chloride (KLOR-CON) 10 MEQ tablet Take 1 tablet (10 mEq total) by mouth daily. 30 tablet 0   torsemide (DEMADEX) 20 MG tablet Take 1 tablet (20 mg total) by mouth daily. 30 tablet 0   melatonin 5 MG TABS Take 1 tablet (5 mg total) by mouth at bedtime. (Patient not taking: Reported on 02/25/2023) 30 tablet 0   No current facility-administered medications on file prior to visit.    There are no Patient Instructions on file for this visit. No follow-ups on file.   Georgiana Spinner, NP

## 2023-04-26 ENCOUNTER — Encounter: Payer: Self-pay | Admitting: Infectious Diseases

## 2023-04-26 ENCOUNTER — Telehealth: Payer: Self-pay

## 2023-04-26 NOTE — Telephone Encounter (Signed)
Received call from [facility] nurse regarding picc line concerns.states picc insertion site is red and draining. RN believes it is a reaction to Gel dressing. Would like to know if they are able to switch dressings.  If so will need to call [employer] with orders. Was originally told that they could not send supplies until seen by provider. Spoke with [employer] team who states they spoke with patient, daughter, and Charity fundraiser. Is scheduled to have supplies sent out with new dressing.  Per Dr. Rivka Safer would like pictures of site sent on mychart. Spoke with patient's daughter who will try to send pictures today. Juanita Laster, RMA

## 2023-04-26 NOTE — Progress Notes (Signed)
 Chief Complaint: Chief Complaint  Patient presents with  . Hospital Follow Up    S/p right hip replacement, sepsis- still undergoing IV antibiotic therapy    Subjective: Brooke Davis is a 82 y.o. female in today for: Hospital follow-up Patient is brought in today by her daughters for hospital follow-up.  She did well and came home but then had a CVA and was found to have left carotid stenosis with stent placed.  Patient then developed sepsis in the hip and has been undergoing 6 weeks of IV antibiotics.  Continues to improve.  Will need another 6 weeks of oral antibiotics per ID afterwards.  Her hemoglobin has improved with the last value up to 10 on October 7.  She continues to follow with ID.  Past Medical History:  Diagnosis Date  . Arthritis   . Carotid stenosis, symptomatic, with infarction (CMS/HHS-HCC) 02/25/2023  . Chronic diastolic CHF (congestive heart failure) (CMS/HHS-HCC) 01/28/2023   Last Assessment & Plan:   Formatting of this note might be different from the original.  - We will continue Demadex , Entresto  and Lopressor .    . Coronary artery disease involving native coronary artery of native heart without angina pectoris 11/17/2021  . Diabetes mellitus type 2, uncomplicated (CMS/HHS-HCC)    DIET CONTROLLED  . Embolic cerebral infarction (CMS/HHS-HCC) 02/02/2023  . Gout, joint   . Hypertension    Past Surgical History:  Procedure Laterality Date  . HYSTERECTOMY  07/20/1982  . INTRO CATH TO MAIN PULMONARY ARTERY/RIGHT HEART  11/18/1999  . Right total hip arthroplasty  01/18/2023   Dr Mardee  . Left carotid stent  02/25/2023   Dr. Marea   Family History  Problem Relation Name Age of Onset  . Diabetes type II Other ##Other1   . Rheum arthritis Maternal Grandmother     Current Outpatient Medications on File Prior to Visit  Medication Sig Dispense Refill  . acetaminophen  (TYLENOL ) 325 MG tablet Take 650 mg by mouth every 6 (six) hours as needed    . allopurinoL   (ZYLOPRIM ) 300 MG tablet TAKE 1 TABLET(300 MG) BY MOUTH EVERY DAY 90 tablet 3  . ALPRAZolam  (XANAX ) 0.25 MG tablet TAKE 1 TABLET(0.25 MG) BY MOUTH TWICE DAILY AS NEEDED FOR SLEEP, STRESS, ANXIETY 180 tablet 1  . atorvastatin  (LIPITOR ) 80 MG tablet Take 1 tablet (80 mg total) by mouth once daily 90 tablet 2  . citalopram  (CELEXA ) 20 MG tablet Take 20 mg by mouth once daily    . clopidogreL  (PLAVIX ) 75 mg tablet Take 75 mg by mouth once daily    . metoprolol  tartrate (LOPRESSOR ) 25 MG tablet Take 1 tablet (25 mg total) by mouth 2 (two) times daily 60 tablet 11  . pantoprazole  (PROTONIX ) 20 MG DR tablet Take 1 tablet (20 mg total) by mouth once daily 30 tablet 3  . TORsemide  (DEMADEX ) 20 MG tablet Take 1 tablet (20 mg total) by mouth once daily 30 tablet 11   No current facility-administered medications on file prior to visit.    Review of Systems - 10 systems negative except as in HPI Objective: Vitals:   04/28/23 0955  BP: 136/74  Pulse: 67   Body mass index is 33.76 kg/m.  In general she is in no acute distress, sitting in wheelchair, alert and oriented Lungs are clear to auscultation bilaterally Heart is atrial fibrillation without murmurs rubs or gallops Extremities without cyanosis clubbing or significant edema  EKG shows atrial fibrillation with slow ventricular response, low voltage QRS, rate  of 57, QRS 0.086, QT 0.48, axis V, no significant change from previous Assessment & Plan: Diagnoses and all orders for this visit:  Receiving intravenous antibiotic treatment at home -     ECG 12-lead Patient hopes to have the PICC line removed at her appointment on Friday, continues to improve Essential hypertension, benign Moderate control, continue current medication, continue to monitor Multiple joint pain -     traMADoL  (ULTRAM ) 50 mg tablet; Take 2 tablets (100 mg total) by mouth every 8 (eight) hours as needed for Pain Will try changing her back to her tramadol  and off the  oxycodone  at this point and see how she does Chronic pain of multiple joints -     traMADoL  (ULTRAM ) 50 mg tablet; Take 2 tablets (100 mg total) by mouth every 8 (eight) hours as needed for Pain  Anemia, unspecified type -     CBC w/auto Differential (3 Part); Future Recheck a CBC on the 22nd per ID Other orders -     Follow up in Primary Care; Future        Follow-up     Future Labs/Procedures Expected by Expires   Follow up in Primary Care [[id number] Custom]  07/29/2023 09/26/2023   Questions:     Does this order need to be coordinated with another visit or should it be hidden from the patient portal? If yes to either, the patient will need to stop by the front desk or call to schedule.: No   Who is this follow-up with?: Me   Can this appointment be overbooked by a scheduler?: No   What type of follow up is needed?: Office Visit   What's the reason for follow up?: Chronic Medical Problems   Allow telemedicine?: In Person Only

## 2023-04-27 ENCOUNTER — Encounter: Payer: Self-pay | Admitting: Infectious Diseases

## 2023-04-27 ENCOUNTER — Ambulatory Visit: Payer: Medicare Other | Attending: Infectious Diseases | Admitting: Infectious Diseases

## 2023-04-27 VITALS — BP 144/73 | HR 46 | Temp 96.6°F

## 2023-04-27 DIAGNOSIS — I4891 Unspecified atrial fibrillation: Secondary | ICD-10-CM | POA: Insufficient documentation

## 2023-04-27 DIAGNOSIS — T8450XA Infection and inflammatory reaction due to unspecified internal joint prosthesis, initial encounter: Secondary | ICD-10-CM

## 2023-04-27 DIAGNOSIS — Z95820 Peripheral vascular angioplasty status with implants and grafts: Secondary | ICD-10-CM | POA: Diagnosis not present

## 2023-04-27 DIAGNOSIS — Z96651 Presence of right artificial knee joint: Secondary | ICD-10-CM | POA: Insufficient documentation

## 2023-04-27 DIAGNOSIS — B9689 Other specified bacterial agents as the cause of diseases classified elsewhere: Secondary | ICD-10-CM | POA: Insufficient documentation

## 2023-04-27 DIAGNOSIS — Z7902 Long term (current) use of antithrombotics/antiplatelets: Secondary | ICD-10-CM | POA: Insufficient documentation

## 2023-04-27 DIAGNOSIS — Z7901 Long term (current) use of anticoagulants: Secondary | ICD-10-CM | POA: Diagnosis not present

## 2023-04-27 DIAGNOSIS — N189 Chronic kidney disease, unspecified: Secondary | ICD-10-CM | POA: Diagnosis not present

## 2023-04-27 DIAGNOSIS — Z79899 Other long term (current) drug therapy: Secondary | ICD-10-CM | POA: Diagnosis not present

## 2023-04-27 DIAGNOSIS — I10 Essential (primary) hypertension: Secondary | ICD-10-CM | POA: Diagnosis present

## 2023-04-27 DIAGNOSIS — T8451XA Infection and inflammatory reaction due to internal right hip prosthesis, initial encounter: Secondary | ICD-10-CM | POA: Diagnosis present

## 2023-04-27 MED ORDER — CIPROFLOXACIN HCL 500 MG PO TABS: 500.0000 mg | ORAL_TABLET | Freq: Two times a day (BID) | ORAL | 1 refills | Status: DC

## 2023-04-27 NOTE — Progress Notes (Unsigned)
NAME: Brooke Davis  DOB: 23-Jan-1941  MRN: [id number]  Date/Time: 04/27/2023 11:30 AM  REQUESTING PROVIDER Subjective:  REASON FOR CONSULT:  ? SHAUNI MCCORKELL is a 82 y.o. with a history of   ID   Steroid/immune suppressants/splenectomy/Hardware Recent Procedure Surgery Injections Trauma Sick contacts Travel Antibiotic use Food- raw/exotic Animal bites Tick exposure Water sports Fishing/hunting/animal bird exposure Past Medical History:  Diagnosis Date   Anxiety    a.) on BZO (alprazolam) PRN   Arthritis    fingers, right hip   Atrial fibrillation (HCC) 11/10/2013   a.) CHA2DS2VASc = 5 (age x2, sex, HTN, T2DM);  b.) rate/rhythm maintained on oral metoprolol tartrate; chronically anticoagulated with apixaban   Bronchitis    Cerebrovascular small vessel disease (chronic)    Coronary artery disease involving native coronary artery of native heart 01/11/2021   a.) LHC 01/11/2021: 40% oLM, 50% mLCx, 30% oLCx, 30% pLAD, 30% mLAD, 99% pRCA (2.75 x 30 mm Resolute Onyx DES), 75% p-mRCA, 60% dRCA   DDD (degenerative disc disease), lumbar 12/01/2013   Diastolic dysfunction 01/12/2021   a.) TTE 01/12/2021 (s/p inf STEMI): EF 55-60%, mild-mod LAE, mild RAE, mod RVE, mild AR/MR, mod TR, G1DD   Diet-controlled type 2 diabetes mellitus (HCC)    Full dentures    Gout    History of bilateral cataract extraction 2021   Hyperlipidemia    Hypertension, essential    Long term current use of anticoagulant    a.) apixaban   PONV (postoperative nausea and vomiting)    Sinoatrial node dysfunction (HCC) 11/10/2013   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) 01/11/2021   a.) LHC/PCI 01/11/2021 --> culprit lesion 99% pRCA --> 2.75 x 30 mm Resolute Onyx DES x 1   Varicose veins of legs    Wears hearing aid in both ears     Past Surgical History:  Procedure Laterality Date   BREAST BIOPSY Left 2011   NEG   CARDIAC CATHETERIZATION     over 20 yrs ago.  All OK.    CAROTID PTA/STENT INTERVENTION Left 02/25/2023   Procedure: CAROTID PTA/STENT INTERVENTION;  Surgeon: Annice Needy, MD;  Location: [facility];  Service: Cardiovascular;  Laterality: Left;   CATARACT EXTRACTION W/PHACO Left 03/27/2020   Procedure: CATARACT EXTRACTION PHACO AND INTRAOCULAR LENS PLACEMENT (IOC) LEFT 7.49  00:56.4  13.3%;  Surgeon: Lockie Mola, MD;  Location: [facility];  Service: Ophthalmology;  Laterality: Left;   CATARACT EXTRACTION W/PHACO Right 04/24/2020   Procedure: CATARACT EXTRACTION PHACO AND INTRAOCULAR LENS PLACEMENT (IOC) RIGHT;  Surgeon: Lockie Mola, MD;  Location: [facility];  Service: Ophthalmology;  Laterality: Right;  8.34 0:50.2 16.6%   CORONARY/GRAFT ACUTE MI REVASCULARIZATION N/A 01/11/2021   Procedure: Coronary/Graft Acute MI Revascularization;  Surgeon: Marcina Millard, MD;  Location: [facility];  Service: Cardiovascular;  Laterality: N/A;   INCISION AND DRAINAGE Right 03/21/2023   Procedure: RIGHT HIP IRRIGATION AND DEBRIDEMENT OF INFECTED TOTAL JOINT, EXCHANGE OF HEAD AND POLY COMPONENTS WITH PLACEMENT OF ANTIBIOTIC BEADS;  Surgeon: Campbell Stall, MD;  Location: [facility];  Service: Orthopedics;  Laterality: Right;   LEFT HEART CATH AND CORONARY ANGIOGRAPHY N/A 01/11/2021   Procedure: LEFT HEART CATH AND CORONARY ANGIOGRAPHY;  Surgeon: Marcina Millard, MD;  Location: [facility];  Service: Cardiovascular;  Laterality: N/A;   PARTIAL HYSTERECTOMY     TOE SURGERY Left    great toe; rod in place   TOTAL HIP ARTHROPLASTY  Right 01/18/2023   Procedure: TOTAL HIP ARTHROPLASTY;  Surgeon: Donato Heinz, MD;  Location: [facility];  Service: Orthopedics;  Laterality: Right;   TOTAL KNEE ARTHROPLASTY Right 2007    Social History   Socioeconomic History   Marital status: Widowed    Spouse name: Not on file   Number of children: 4   Years of education: Not on file   Highest education  level: Not on file  Occupational History   Not on file  Tobacco Use   Smoking status: Former    Current packs/day: 0.00    Types: Cigarettes    Quit date: 1980    Years since quitting: 44.8   Smokeless tobacco: Never  Vaping Use   Vaping status: Never Used  Substance and Sexual Activity   Alcohol use: Not Currently   Drug use: Never   Sexual activity: Not on file  Other Topics Concern   Not on file  Social History Narrative   Not on file   Social Determinants of Health   Financial Resource Strain: Patient Declined (11/05/2022)   Received from [facility]   Overall Financial Resource Strain (CARDIA)    Difficulty of Paying Living Expenses: Patient declined  Food Insecurity: No Food Insecurity (03/19/2023)   Hunger Vital Sign    Worried About Running Out of Food in the Last Year: Never true    Ran Out of Food in the Last Year: Never true  Transportation Needs: No Transportation Needs (03/19/2023)   PRAPARE - Administrator, Civil Service (Medical): No    Lack of Transportation (Non-Medical): No  Physical Activity: Not on file  Stress: Not on file  Social Connections: Not on file  Intimate Partner Violence: Not At Risk (03/19/2023)   Humiliation, Afraid, Rape, and Kick questionnaire    Fear of Current or Ex-Partner: No    Emotionally Abused: No    Physically Abused: No    Sexually Abused: No    Family History  Problem Relation Age of Onset   Heart attack Mother 2       required open heart surgery. Onset age is unknown.   Stroke Father    Diabetes Father    Alcoholism Brother    Breast cancer Neg Hx    Allergies  Allergen Reactions   Celebrex [Celecoxib] Diarrhea   Penicillins Itching    IgE = 154 (WNL) on 01/05/2023   Relafen [Nabumetone]     Palpitation    Chlorhexidine Rash   Lovastatin Diarrhea   I? Current Outpatient Medications  Medication Sig Dispense Refill   acetaminophen (TYLENOL) 325 MG  tablet Take 2 tablets (650 mg total) by mouth every 6 (six) hours as needed for mild pain (or Fever >/= 101).     allopurinol (ZYLOPRIM) 300 MG tablet Take 1 tablet (300 mg total) by mouth daily. 30 tablet 0   ALPRAZolam (XANAX) 0.25 MG tablet Take 1 tablet (0.25 mg total) by mouth 2 (two) times daily as needed for anxiety. 5 tablet 0   atorvastatin (LIPITOR) 80 MG tablet Take 1 tablet (80 mg total) by mouth daily. 30 tablet 11   cefTRIAXone (ROCEPHIN) IVPB Inject 2 g into the vein daily. Indication: Serratia hip prosthetic joint infection Last Day of Therapy: 05/03/2023 Labs - Once weekly:  CBC/D and BMP Labs - Once weekly: ESR and CRP Fax weekly labs to [phone] Please pull PIC at completion of IV antibiotics Method of administration: IV Push  Method of administration may be changed at the discretion of facility/pharmacy. 39 Units 0   citalopram (CELEXA) 20 MG tablet Take 1 tablet (20 mg total) by mouth daily. 30 tablet 0   clopidogrel (PLAVIX) 75 MG tablet Take 1 tablet (75 mg total) by mouth daily. 30 tablet 6   melatonin 5 MG TABS Take 1 tablet (5 mg total) by mouth at bedtime. 30 tablet 0   metoprolol tartrate (LOPRESSOR) 25 MG tablet Take 1 tablet (25 mg total) by mouth 2 (two) times daily. 60 tablet 0   oxyCODONE (OXY IR/ROXICODONE) 5 MG immediate release tablet Take 1 tablet (5 mg total) by mouth every 6 (six) hours as needed for moderate pain (pain score 4-6). 10 tablet 0   potassium chloride (KLOR-CON) 10 MEQ tablet Take 1 tablet (10 mEq total) by mouth daily. 30 tablet 0   torsemide (DEMADEX) 20 MG tablet Take 1 tablet (20 mg total) by mouth daily. 30 tablet 0   No current facility-administered medications for this visit.     Abtx:  Anti-infectives (From admission, onward)    None       REVIEW OF SYSTEMS:  Const: negative fever, negative chills, negative weight loss Eyes: negative diplopia or visual changes, negative eye pain ENT: negative coryza, negative sore  throat Resp: negative cough, hemoptysis, dyspnea Cards: negative for chest pain, palpitations, lower extremity edema GU: negative for frequency, dysuria and hematuria GI: Negative for abdominal pain, diarrhea, bleeding, constipation Skin: negative for rash and pruritus Heme: negative for easy bruising and gum/nose bleeding MS: negative for myalgias, arthralgias, back pain and muscle weakness Neurolo:negative for headaches, dizziness, vertigo, memory problems  Psych: negative for feelings of anxiety, depression  Endocrine: negative for thyroid, diabetes Allergy/Immunology- negative for any medication or food allergies ? Pertinent Positives include : Objective:  VITALS:  BP (!) 144/73   Pulse (!) 46   Temp (!) 96.6 F (35.9 C) (Temporal)   SpO2 95%  LDA Foley Central line Other drainage tubes PHYSICAL EXAM:  General: Alert, cooperative, no distress, appears stated age.  Head: Normocephalic, without obvious abnormality, atraumatic. Eyes: Conjunctivae clear, anicteric sclerae. Pupils are equal ENT Nares normal. No drainage or sinus tenderness. Lips, mucosa, and tongue normal. No Thrush Neck: Supple, symmetrical, no adenopathy, thyroid: non tender no carotid bruit and no JVD. Back: No CVA tenderness. Lungs: Clear to auscultation bilaterally. No Wheezing or Rhonchi. No rales. Heart: Regular rate and rhythm, no murmur, rub or gallop. Abdomen: Soft, non-tender,not distended. Bowel sounds normal. No masses Extremities: atraumatic, no cyanosis. No edema. No clubbing Skin: No rashes or lesions. Or bruising Lymph: Cervical, supraclavicular normal. Neurologic: Grossly non-focal Pertinent Labs Lab Results CBC    Component Value Date/Time   WBC 9.6 04/02/2023 0517   RBC 2.65 (L) 04/02/2023 0517   HGB 8.3 (L) 04/02/2023 0517   HCT 25.2 (L) 04/02/2023 0517   PLT 297 04/02/2023 0517   MCV 95.1 04/02/2023 0517   MCH 31.3 04/02/2023 0517   MCHC 32.9 04/02/2023 0517   RDW 19.7 (H)  04/02/2023 0517   LYMPHSABS 1.4 04/02/2023 0517   MONOABS 0.6 04/02/2023 0517   EOSABS 0.3 04/02/2023 0517   BASOSABS 0.1 04/02/2023 0517       Latest Ref Rng & Units 04/02/2023    5:17 AM 04/01/2023    4:35 AM 03/31/2023    5:23 AM  CMP  Glucose 70 - 99 mg/dL 884  166  063   BUN 8 - 23 mg/dL 39  48  53   Creatinine 0.44 - 1.00 mg/dL 3.32  9.51  8.84   Sodium 135 - 145 mmol/L 138  137  137   Potassium 3.5 - 5.1 mmol/L 3.6  3.2  3.5   Chloride 98 - 111 mmol/L 101  99  103   CO2 22 - 32 mmol/L 29  28  28    Calcium 8.9 - 10.3 mg/dL 8.8  8.8  8.7       Microbiology: No results found for this or any previous visit (from the past 240 hour(s)).  IMAGING RESULTS: I have personally reviewed the films ? Impression/Recommendation ? ? ? ___I have personally spent  ---minutes involved in face-to-face and non-face-to-face activities for this patient on the day of the visit. Professional time spent includes the following activities: Preparing to see the patient (review of tests), Obtaining and/or reviewing separately obtained history (admission/discharge record), Performing a medically appropriate examination and/or evaluation , Ordering medications/tests/procedures, referring and communicating with other health care professionals, Documenting clinical information in the EMR, Independently interpreting results (not separately reported), Communicating results to the patient/family/caregiver, Counseling and educating the patient/family/caregiver and Care coordination (not separately reported).    ________________________________________________ Discussed with patient, requesting provider Note:  This document was prepared using Dragon voice recognition software and may include unintentional dictation errors.

## 2023-04-27 NOTE — Patient Instructions (Signed)
You are here for follow up- you will finsih IV on 05/03/23- from 10/15 you will start Ciprofloxacin 500mg  twice a day Do not take Vitamins, dairy, with ciprofloxacin Need labs around 10/23-10/24- Cbc with diff and CMP  Need EKG to look for QT interval- you can ask Dr.hedrick Please fax a copy of EKG to Dr.Cristobal Advani [phone]

## 2023-05-04 ENCOUNTER — Telehealth: Payer: Self-pay

## 2023-05-04 NOTE — Telephone Encounter (Signed)
Received call from patient's home health nurse, Lulu with [name], stating she pulled patient's PICC line today without any complications.   She is requesting that PRN fluconazole be sent in as patient has frequent yeast infections and is starting oral cipro.   Sandie Ano, RN

## 2023-05-07 ENCOUNTER — Other Ambulatory Visit: Payer: Self-pay | Admitting: Infectious Diseases

## 2023-05-07 MED ORDER — NYSTATIN 100000 UNIT/GM EX POWD: 1.0000 | Freq: Three times a day (TID) | CUTANEOUS | 0 refills | Status: AC

## 2023-05-07 NOTE — Progress Notes (Unsigned)
Nystatin powder e prescription sent to pharmacy

## 2023-05-07 NOTE — Telephone Encounter (Signed)
Spoke with [name] (DPR), she states they are not concerned about vaginal yeast infection, but the folds of her skin. They are wondering if some PRN nystatin powder could be sent in.   Sandie Ano, RN

## 2023-05-10 NOTE — Telephone Encounter (Signed)
Spoke with [name] and notified her that nystatin powder has been sent in. [name] reports that Cleva has also been experiencing nausea and is wondering if they can have something sent in for that as well.   Sandie Ano, RN

## 2023-05-10 NOTE — Telephone Encounter (Signed)
Called to speak with Renea Ee or [name] and let them know that nystatin was sent in, patient's son in law answered and states they are not available at the moment. He will request that they give our office a call back.  Sandie Ano, RN

## 2023-05-13 NOTE — Telephone Encounter (Signed)
Spoke with [name], she says Denylah had some left over antiemetics from a hospital stay and took those. She doesn't believe she needs any sent in as the nausea seems to have subsided. Asked her to call back if anything changes.  Sandie Ano, RN

## 2023-06-22 ENCOUNTER — Encounter: Payer: Self-pay | Admitting: Infectious Diseases

## 2023-06-22 ENCOUNTER — Ambulatory Visit: Payer: Medicare Other | Attending: Infectious Diseases | Admitting: Infectious Diseases

## 2023-06-22 ENCOUNTER — Other Ambulatory Visit
Admission: RE | Admit: 2023-06-22 | Discharge: 2023-06-22 | Disposition: A | Discharge status: Home / Self Care | Payer: Medicare Other | Source: Ambulatory Visit | Attending: Infectious Diseases | Admitting: Infectious Diseases

## 2023-06-22 VITALS — BP 153/75 | HR 73 | Temp 97.5°F

## 2023-06-22 DIAGNOSIS — T8451XD Infection and inflammatory reaction due to internal right hip prosthesis, subsequent encounter: Secondary | ICD-10-CM | POA: Insufficient documentation

## 2023-06-22 DIAGNOSIS — I4891 Unspecified atrial fibrillation: Secondary | ICD-10-CM | POA: Diagnosis not present

## 2023-06-22 DIAGNOSIS — Z95820 Peripheral vascular angioplasty status with implants and grafts: Secondary | ICD-10-CM | POA: Insufficient documentation

## 2023-06-22 DIAGNOSIS — N189 Chronic kidney disease, unspecified: Secondary | ICD-10-CM | POA: Diagnosis not present

## 2023-06-22 DIAGNOSIS — M1712 Unilateral primary osteoarthritis, left knee: Secondary | ICD-10-CM | POA: Diagnosis not present

## 2023-06-22 DIAGNOSIS — Z79899 Other long term (current) drug therapy: Secondary | ICD-10-CM | POA: Diagnosis not present

## 2023-06-22 DIAGNOSIS — B9689 Other specified bacterial agents as the cause of diseases classified elsewhere: Secondary | ICD-10-CM | POA: Diagnosis not present

## 2023-06-22 DIAGNOSIS — I129 Hypertensive chronic kidney disease with stage 1 through stage 4 chronic kidney disease, or unspecified chronic kidney disease: Secondary | ICD-10-CM | POA: Diagnosis not present

## 2023-06-22 DIAGNOSIS — Z96651 Presence of right artificial knee joint: Secondary | ICD-10-CM | POA: Diagnosis not present

## 2023-06-22 DIAGNOSIS — T8450XD Infection and inflammatory reaction due to unspecified internal joint prosthesis, subsequent encounter: Secondary | ICD-10-CM | POA: Diagnosis present

## 2023-06-22 DIAGNOSIS — E1122 Type 2 diabetes mellitus with diabetic chronic kidney disease: Secondary | ICD-10-CM | POA: Diagnosis not present

## 2023-06-22 DIAGNOSIS — Z7902 Long term (current) use of antithrombotics/antiplatelets: Secondary | ICD-10-CM | POA: Diagnosis not present

## 2023-06-22 LAB — CBC WITH DIFFERENTIAL/PLATELET
Abs Immature Granulocytes: 0.01 K/uL (ref 0.00–0.07)
Basophils Absolute: 0 K/uL (ref 0.0–0.1)
Basophils Relative: 0 %
Eosinophils Absolute: 0.3 K/uL (ref 0.0–0.5)
Eosinophils Relative: 5 %
HCT: 34.8 % — ABNORMAL LOW (ref 36.0–46.0)
Hemoglobin: 11.5 g/dL — ABNORMAL LOW (ref 12.0–15.0)
Immature Granulocytes: 0 %
Lymphocytes Relative: 21 %
Lymphs Abs: 1.1 K/uL (ref 0.7–4.0)
MCH: 33 pg (ref 26.0–34.0)
MCHC: 33 g/dL (ref 30.0–36.0)
MCV: 100 fL (ref 80.0–100.0)
Monocytes Absolute: 0.3 K/uL (ref 0.1–1.0)
Monocytes Relative: 7 %
Neutro Abs: 3.4 K/uL (ref 1.7–7.7)
Neutrophils Relative %: 67 %
Platelets: 168 K/uL (ref 150–400)
RBC: 3.48 MIL/uL — ABNORMAL LOW (ref 3.87–5.11)
RDW: 14.6 % (ref 11.5–15.5)
WBC: 5.2 K/uL (ref 4.0–10.5)
nRBC: 0 % (ref 0.0–0.2)

## 2023-06-22 LAB — COMPREHENSIVE METABOLIC PANEL WITH GFR
ALT: 36 U/L (ref 0–44)
AST: 47 U/L — ABNORMAL HIGH (ref 15–41)
Albumin: 3.8 g/dL (ref 3.5–5.0)
Alkaline Phosphatase: 91 U/L (ref 38–126)
Anion gap: 10 (ref 5–15)
BUN: 24 mg/dL — ABNORMAL HIGH (ref 8–23)
CO2: 27 mmol/L (ref 22–32)
Calcium: 9.3 mg/dL (ref 8.9–10.3)
Chloride: 100 mmol/L (ref 98–111)
Creatinine, Ser: 1.03 mg/dL — ABNORMAL HIGH (ref 0.44–1.00)
GFR, Estimated: 55 mL/min — ABNORMAL LOW
Glucose, Bld: 91 mg/dL (ref 70–99)
Potassium: 3.6 mmol/L (ref 3.5–5.1)
Sodium: 137 mmol/L (ref 135–145)
Total Bilirubin: 0.6 mg/dL
Total Protein: 7 g/dL (ref 6.5–8.1)

## 2023-06-22 LAB — SEDIMENTATION RATE: Sed Rate: 33 mm/h — ABNORMAL HIGH (ref 0–30)

## 2023-06-22 LAB — C-REACTIVE PROTEIN: CRP: <0.5 mg/dL (ref <1.0)

## 2023-06-22 NOTE — Progress Notes (Unsigned)
NAME: Brooke Davis  DOB: 16-Jul-1941  MRN: [id number]  Date/Time: 06/22/2023 11:58 AM   Subjective:  She is here with her daughters ? Brooke Davis is a 82 y.o. female with h/o Afib, HTN   , Rt carotid stenting  Rt hip joint replacement on 01/18/23 followed by infection of the rt hip leading to I/D and capsule change on 03/21/23, culture posiitve for serratia, was seen by ID when she ws in the hospital in Set 2024 and was discharged on 6 weeks on IV ceftriaxone She is doing better Says pain rt hip much better Had a reaction to gel patch at the PICC site but now has a biopatch and is better She will finish 6 weeks on 10/14 and then PICC will be removed and plan is to give PO antibiotic cipro for 6 more weeks    Past Medical History:  Diagnosis Date   Anxiety    a.) on BZO (alprazolam) PRN   Arthritis    fingers, right hip   Atrial fibrillation (HCC) 11/10/2013   a.) CHA2DS2VASc = 5 (age x2, sex, HTN, T2DM);  b.) rate/rhythm maintained on oral metoprolol tartrate; chronically anticoagulated with apixaban   Bronchitis    Cerebrovascular small vessel disease (chronic)    Coronary artery disease involving native coronary artery of native heart 01/11/2021   a.) LHC 01/11/2021: 40% oLM, 50% mLCx, 30% oLCx, 30% pLAD, 30% mLAD, 99% pRCA (2.75 x 30 mm Resolute Onyx DES), 75% p-mRCA, 60% dRCA   DDD (degenerative disc disease), lumbar 12/01/2013   Diastolic dysfunction 01/12/2021   a.) TTE 01/12/2021 (s/p inf STEMI): EF 55-60%, mild-mod LAE, mild RAE, mod RVE, mild AR/MR, mod TR, G1DD   Diet-controlled type 2 diabetes mellitus (HCC)    Full dentures    Gout    History of bilateral cataract extraction 2021   Hyperlipidemia    Hypertension, essential    Long term current use of anticoagulant    a.) apixaban   PONV (postoperative nausea and vomiting)    Sinoatrial node dysfunction (HCC) 11/10/2013   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) 01/11/2021   a.) LHC/PCI  01/11/2021 --> culprit lesion 99% pRCA --> 2.75 x 30 mm Resolute Onyx DES x 1   Varicose veins of legs    Wears hearing aid in both ears     Past Surgical History:  Procedure Laterality Date   BREAST BIOPSY Left 2011   NEG   CARDIAC CATHETERIZATION     over 20 yrs ago.  All OK.   CAROTID PTA/STENT INTERVENTION Left 02/25/2023   Procedure: CAROTID PTA/STENT INTERVENTION;  Surgeon: Annice Needy, MD;  Location: [facility];  Service: Cardiovascular;  Laterality: Left;   CATARACT EXTRACTION W/PHACO Left 03/27/2020   Procedure: CATARACT EXTRACTION PHACO AND INTRAOCULAR LENS PLACEMENT (IOC) LEFT 7.49  00:56.4  13.3%;  Surgeon: Lockie Mola, MD;  Location: [facility];  Service: Ophthalmology;  Laterality: Left;   CATARACT EXTRACTION W/PHACO Right 04/24/2020   Procedure: CATARACT EXTRACTION PHACO AND INTRAOCULAR LENS PLACEMENT (IOC) RIGHT;  Surgeon: Lockie Mola, MD;  Location: [facility];  Service: Ophthalmology;  Laterality: Right;  8.34 0:50.2 16.6%   CORONARY/GRAFT ACUTE MI REVASCULARIZATION N/A 01/11/2021   Procedure: Coronary/Graft Acute MI Revascularization;  Surgeon: Marcina Millard, MD;  Location: [facility];  Service: Cardiovascular;  Laterality: N/A;   INCISION AND DRAINAGE Right 03/21/2023   Procedure: RIGHT HIP IRRIGATION AND DEBRIDEMENT OF INFECTED TOTAL JOINT, EXCHANGE OF HEAD  AND POLY COMPONENTS WITH PLACEMENT OF ANTIBIOTIC BEADS;  Surgeon: Campbell Stall, MD;  Location: [facility];  Service: Orthopedics;  Laterality: Right;   LEFT HEART CATH AND CORONARY ANGIOGRAPHY N/A 01/11/2021   Procedure: LEFT HEART CATH AND CORONARY ANGIOGRAPHY;  Surgeon: Marcina Millard, MD;  Location: [facility];  Service: Cardiovascular;  Laterality: N/A;   PARTIAL HYSTERECTOMY     TOE SURGERY Left    great toe; rod in place   TOTAL HIP ARTHROPLASTY Right 01/18/2023   Procedure: TOTAL HIP ARTHROPLASTY;  Surgeon: Donato Heinz, MD;   Location: [facility];  Service: Orthopedics;  Laterality: Right;   TOTAL KNEE ARTHROPLASTY Right 2007    Social History   Socioeconomic History   Marital status: Widowed    Spouse name: Not on file   Number of children: 4   Years of education: Not on file   Highest education level: Not on file  Occupational History   Not on file  Tobacco Use   Smoking status: Former    Current packs/day: 0.00    Types: Cigarettes    Quit date: 1980    Years since quitting: 44.9   Smokeless tobacco: Never  Vaping Use   Vaping status: Never Used  Substance and Sexual Activity   Alcohol use: Not Currently   Drug use: Never   Sexual activity: Not on file  Other Topics Concern   Not on file  Social History Narrative   Not on file   Social Determinants of Health   Financial Resource Strain: Patient Declined (11/05/2022)   [facility]   Overall Financial Resource Strain (CARDIA)    Difficulty of Paying Living Expenses: Patient declined  Food Insecurity: No Food Insecurity (03/19/2023)   Hunger Vital Sign    Worried About Running Out of Food in the Last Year: Never true    Ran Out of Food in the Last Year: Never true  Transportation Needs: No Transportation Needs (03/19/2023)   PRAPARE - Administrator, Civil Service (Medical): No    Lack of Transportation (Non-Medical): No  Physical Activity: Not on file  Stress: Not on file  Social Connections: Not on file  Intimate Partner Violence: Not At Risk (03/19/2023)   Humiliation, Afraid, Rape, and Kick questionnaire    Fear of Current or Ex-Partner: No    Emotionally Abused: No    Physically Abused: No    Sexually Abused: No    Family History  Problem Relation Age of Onset   Heart attack Mother 29       required open heart surgery. Onset age is unknown.   Stroke Father    Diabetes Father    Alcoholism Brother    Breast cancer Neg Hx    Allergies  Allergen Reactions    Celebrex [Celecoxib] Diarrhea   Penicillins Itching    IgE = 154 (WNL) on 01/05/2023   Relafen [Nabumetone]     Palpitation    Sulfa Antibiotics    Chlorhexidine Rash   Lovastatin Diarrhea   I? Current Outpatient Medications  Medication Sig Dispense Refill   acetaminophen (TYLENOL) 325 MG tablet Take 2 tablets (650 mg total) by mouth every 6 (six) hours as needed for mild pain (or Fever >/= 101).     allopurinol (ZYLOPRIM) 300 MG tablet Take 1 tablet (300 mg total) by mouth daily. 30 tablet 0   ALPRAZolam (XANAX) 0.25 MG tablet Take 1 tablet (0.25 mg total)  by mouth 2 (two) times daily as needed for anxiety. 5 tablet 0   atorvastatin (LIPITOR) 80 MG tablet Take 1 tablet (80 mg total) by mouth daily. 30 tablet 11   ciprofloxacin (CIPRO) 500 MG tablet Take 1 tablet (500 mg total) by mouth 2 (two) times daily. 60 tablet 1   citalopram (CELEXA) 20 MG tablet Take 1 tablet (20 mg total) by mouth daily. 30 tablet 0   clopidogrel (PLAVIX) 75 MG tablet Take 1 tablet (75 mg total) by mouth daily. 30 tablet 6   melatonin 5 MG TABS Take 1 tablet (5 mg total) by mouth at bedtime. 30 tablet 0   metoprolol tartrate (LOPRESSOR) 25 MG tablet Take 1 tablet (25 mg total) by mouth 2 (two) times daily. 60 tablet 0   nystatin (MYCOSTATIN/NYSTOP) powder Apply 1 Application topically 3 (three) times daily. 15 g 0   potassium chloride (KLOR-CON) 10 MEQ tablet Take 1 tablet (10 mEq total) by mouth daily. 30 tablet 0   torsemide (DEMADEX) 20 MG tablet Take 1 tablet (20 mg total) by mouth daily. 30 tablet 0   traMADol (ULTRAM) 50 MG tablet Take 50 mg by mouth every 4 (four) hours as needed.     No current facility-administered medications for this visit.     Abtx:  Anti-infectives (From admission, onward)    None       REVIEW OF SYSTEMS:  Const: negative fever, negative chills, negative weight loss Eyes: negative diplopia or visual changes, negative eye pain ENT: negative coryza, negative sore  throat Resp: negative cough, hemoptysis, dyspnea Cards: negative for chest pain, palpitations, lower extremity edema GU: negative for frequency, dysuria and hematuria GI: Negative for abdominal pain, diarrhea, bleeding, constipation Skin: negative for rash and pruritus Heme: negative for easy bruising and gum/nose bleeding MS: rt hip pain Some  muscle weakness Neurolo:negative for headaches, dizziness, vertigo, memory problems  Psych: anxiety Endocrine: negative for thyroid, diabetes Allergy/Immunology- negative for any medication or food allergies ? Pertinent Positives include : Objective:  VITALS:  BP (!) 153/75   Pulse 73   Temp (!) 97.5 F (36.4 C) (Temporal)   PHYSICAL EXAM:  General: Alert, cooperative, no distress, appears stated age. In wheel chair Head: Normocephalic, without obvious abnormality, atraumatic. Eyes: Conjunctivae clear, anicteric sclerae. Pupils are equal ENT Nares normal. No drainage or sinus tenderness. Lips, mucosa, and tongue normal. No Thrush Neck: Supple, symmetrical, no adenopathy, thyroid: non tender no carotid bruit and no JVD. Back: No CVA tenderness. Lungs: Clear to auscultation bilaterally. No Wheezing or Rhonchi. No rales. Heart: systolic murmur- irregular Abdomen: Soft, non-tender,not distended. Bowel sounds normal. No masses Extremities: rt hip surgical scar healing- no erythema or discharge Skin: No rashes or lesions. Or bruising Lymph: Cervical, supraclavicular normal. Neurologic: Grossly non-focal Pertinent Labs Lab Results 04/26/23 CRP 18 ESR 22 Hb 10 Cr N  ? 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 Receiving ceftriaxone and will complete 6 weeks on 10/14. PICC can be removed after and then he will go on ciprofloxacin 500mg  PO BID for 6 more weeks Discussed side effects of cipro Will get labs 7-10 days after starting cipro Will ask his PCP to check EKG to look for QT interval. She is  seeing him tomorrow Looked at DDI - none of significant concern  CKD  Afib on metprolol  Left carotid atherosclerosis s/p Stent -aug 2024 On plavix  Rt TKA  Follow up 2 months ? ?____________________________ Discussed with patient,her daughters  Note:  This document was prepared using Dragon voice recognition software and may include unintentional dictation errors.

## 2023-06-22 NOTE — Patient Instructions (Signed)
  During today's visit, we discussed your ongoing joint pain and stiffness, particularly in your left  knee and back, as well as your progress with the 12-week antibiotic course for an infection.  YOUR PLAN: infection OF RIGHT HIP REPLACEMENT: You are healing well from your right hip replacement with no signs of infection. Please complete your antibiotic course and watch for any signs of recurrent infection such as pain, swelling, redness, or discharge. We will also order labs to check your inflammatory markers and organ function after you finish the antibiotics.  -BACK PAIN: Your chronic back pain may be worsened by changes in your walking pattern due to joint issues. We may refer you to a chiropractor or physical therapist for further evaluation and treatment.  -GENERAL HEALTH MAINTENANCE: Please continue using your rollator for mobility and safety. To minimize nausea, take your antibiotic, Capryl, with food.  INSTRUCTIONS:  Complete your antibiotic course as prescribed. Watch for any signs of recurrent infection such as pain, swelling, redness, or discharge, and contact us if these occur. We will order labs to check your inflammatory markers and organ function after you finish the antibiotics.

## 2023-07-06 ENCOUNTER — Other Ambulatory Visit (INDEPENDENT_AMBULATORY_CARE_PROVIDER_SITE_OTHER): Payer: Self-pay | Admitting: Nurse Practitioner

## 2023-07-06 DIAGNOSIS — I6523 Occlusion and stenosis of bilateral carotid arteries: Secondary | ICD-10-CM

## 2023-07-08 ENCOUNTER — Ambulatory Visit (INDEPENDENT_AMBULATORY_CARE_PROVIDER_SITE_OTHER): Payer: Medicare Other | Admitting: Nurse Practitioner

## 2023-07-08 ENCOUNTER — Ambulatory Visit (INDEPENDENT_AMBULATORY_CARE_PROVIDER_SITE_OTHER): Payer: Medicare Other

## 2023-07-08 ENCOUNTER — Encounter (INDEPENDENT_AMBULATORY_CARE_PROVIDER_SITE_OTHER): Payer: Self-pay | Admitting: Nurse Practitioner

## 2023-07-08 VITALS — BP 152/82 | HR 69 | Resp 18 | Ht 62.0 in | Wt 192.0 lb

## 2023-07-08 DIAGNOSIS — I10 Essential (primary) hypertension: Secondary | ICD-10-CM | POA: Diagnosis not present

## 2023-07-08 DIAGNOSIS — I6523 Occlusion and stenosis of bilateral carotid arteries: Secondary | ICD-10-CM | POA: Diagnosis not present

## 2023-07-19 NOTE — Progress Notes (Signed)
Subjective:    Patient ID: Brooke Davis, female    DOB: September 05, 1940, 82 y.o.   MRN: [id number] Chief Complaint  Patient presents with   Follow-up    3 month follow up with carotid    The patient is seen for follow up evaluation of carotid stenosis status post left carotid stent on 02/25/2023.  There were no post operative problems or complications related to the surgery.  The patient denies neck or incisional pain.   The patient denies headache.  The patient is taking enteric-coated aspirin 81 mg daily.  No recent shortening of the patient's walking distance or new symptoms consistent with claudication.  No history of rest pain symptoms. No new ulcers or wounds of the lower extremities have occurred.  There is no history of DVT, PE or superficial thrombophlebitis. No recent episodes of angina or shortness of breath documented.   Today noninvasive studies show 1 to 39% stenosis right ICA with minimal stenosis of the left.  Widely patent left stent placement.     Review of Systems  Cardiovascular:  Positive for leg swelling.  Musculoskeletal:  Positive for arthralgias and gait problem.  Neurological:  Positive for weakness.  All other systems reviewed and are negative.      Objective:   Physical Exam Vitals reviewed.  HENT:     Head: Normocephalic.  Neck:     Vascular: No carotid bruit.  Cardiovascular:     Rate and Rhythm: Normal rate.  Pulmonary:     Effort: Pulmonary effort is normal.  Skin:    General: Skin is warm and dry.  Neurological:     Mental Status: She is alert and oriented to person, place, and time.  Psychiatric:        Mood and Affect: Mood normal.        Behavior: Behavior normal.        Thought Content: Thought content normal.        Judgment: Judgment normal.     BP (!) 152/82   Pulse 69   Resp 18   Ht 5\' 2"  (1.575 m)   Wt 192 lb (87.1 kg)   BMI 35.12 kg/m   Past Medical History:  Diagnosis Date   Anxiety    a.) on BZO  (alprazolam) PRN   Arthritis    fingers, right hip   Atrial fibrillation (HCC) 11/10/2013   a.) CHA2DS2VASc = 5 (age x2, sex, HTN, T2DM);  b.) rate/rhythm maintained on oral metoprolol tartrate; chronically anticoagulated with apixaban   Bronchitis    Cerebrovascular small vessel disease (chronic)    Coronary artery disease involving native coronary artery of native heart 01/11/2021   a.) LHC 01/11/2021: 40% oLM, 50% mLCx, 30% oLCx, 30% pLAD, 30% mLAD, 99% pRCA (2.75 x 30 mm Resolute Onyx DES), 75% p-mRCA, 60% dRCA   DDD (degenerative disc disease), lumbar 12/01/2013   Diastolic dysfunction 01/12/2021   a.) TTE 01/12/2021 (s/p inf STEMI): EF 55-60%, mild-mod LAE, mild RAE, mod RVE, mild AR/MR, mod TR, G1DD   Diet-controlled type 2 diabetes mellitus (HCC)    Full dentures    Gout    History of bilateral cataract extraction 2021   Hyperlipidemia    Hypertension, essential    Long term current use of anticoagulant    a.) apixaban   PONV (postoperative nausea and vomiting)    Sinoatrial node dysfunction (HCC) 11/10/2013   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) 01/11/2021   a.) LHC/PCI 01/11/2021 -->  culprit lesion 99% pRCA --> 2.75 x 30 mm Resolute Onyx DES x 1   Varicose veins of legs    Wears hearing aid in both ears     Social History   Socioeconomic History   Marital status: Widowed    Spouse name: Not on file   Number of children: 4   Years of education: Not on file   Highest education level: Not on file  Occupational History   Not on file  Tobacco Use   Smoking status: Former    Current packs/day: 0.00    Types: Cigarettes    Quit date: 1980    Years since quitting: 45.0   Smokeless tobacco: Never  Vaping Use   Vaping status: Never Used  Substance and Sexual Activity   Alcohol use: Not Currently   Drug use: Never   Sexual activity: Not on file  Other Topics Concern   Not on file  Social History Narrative   Not on file   Social  Drivers of Health   Financial Resource Strain: Patient Declined (11/05/2022)   Received from [facility], [facility]   Overall Financial Resource Strain (CARDIA)    Difficulty of Paying Living Expenses: Patient declined  Food Insecurity: No Food Insecurity (03/19/2023)   Hunger Vital Sign    Worried About Running Out of Food in the Last Year: Never true    Ran Out of Food in the Last Year: Never true  Transportation Needs: No Transportation Needs (03/19/2023)   PRAPARE - Administrator, Civil Service (Medical): No    Lack of Transportation (Non-Medical): No  Physical Activity: Not on file  Stress: Not on file  Social Connections: Not on file  Intimate Partner Violence: Not At Risk (03/19/2023)   Humiliation, Afraid, Rape, and Kick questionnaire    Fear of Current or Ex-Partner: No    Emotionally Abused: No    Physically Abused: No    Sexually Abused: No    Past Surgical History:  Procedure Laterality Date   BREAST BIOPSY Left 2011   NEG   CARDIAC CATHETERIZATION     over 20 yrs ago.  All OK.   CAROTID PTA/STENT INTERVENTION Left 02/25/2023   Procedure: CAROTID PTA/STENT INTERVENTION;  Surgeon: Annice Needy, MD;  Location: [facility] IN[facility]IVE [facility];  Service: Cardiovascular;  Laterality: Left;   CATARACT EXTRACTION W/PHACO Left 03/27/2020   Procedure: CATARACT EXTRACTION PHACO AND INTRAOCULAR LENS PLACEMENT (IOC) LEFT 7.49  00:56.4  13.3%;  Surgeon: Lockie Mola, MD;  Location: [facility];  Service: Ophthalmology;  Laterality: Left;   CATARACT EXTRACTION W/PHACO Right 04/24/2020   Procedure: CATARACT EXTRACTION PHACO AND INTRAOCULAR LENS PLACEMENT (IOC) RIGHT;  Surgeon: Lockie Mola, MD;  Location: [facility];  Service: Ophthalmology;  Laterality: Right;  8.34 0:50.2 16.6%   CORONARY/GRAFT ACUTE MI REVASCULARIZATION N/A 01/11/2021   Procedure: Coronary/Graft Acute MI Revascularization;  Surgeon: Marcina Millard, MD;  Location: [facility] IN[facility] CV [facility];  Service: Cardiovascular;  Laterality: N/A;   INCISION AND DRAINAGE Right 03/21/2023   Procedure: RIGHT HIP IRRIGATION AND DEBRIDEMENT OF INFECTED TOTAL JOINT, EXCHANGE OF HEAD AND POLY COMPONENTS WITH PLACEMENT OF ANTIBIOTIC BEADS;  Surgeon: Campbell Stall, MD;  Location: [facility] OR[facility];  Service: Orthopedics;  Laterality: Right;   LEFT HEART CATH AND CORONARY ANGIOGRAPHY N/A 01/11/2021   Procedure: LEFT HEART CATH AND CORONARY ANGIOGRAPHY;  Surgeon: Marcina Millard, MD;  Location: [facility];  Service: Cardiovascular;  Laterality: N/A;  PARTIAL HYSTERECTOMY     TOE SURGERY Left    great toe; rod in place   TOTAL HIP ARTHROPLASTY Right 01/18/2023   Procedure: TOTAL HIP ARTHROPLASTY;  Surgeon: Donato Heinz, MD;  Location: [facility] OR[facility];  Service: Orthopedics;  Laterality: Right;   TOTAL KNEE ARTHROPLASTY Right 2007    Family History  Problem Relation Age of Onset   Heart attack Mother 75       required open heart surgery. Onset age is unknown.   Stroke Father    Diabetes Father    Alcoholism Brother    Breast cancer Neg Hx     Allergies  Allergen Reactions   Celebrex [Celecoxib] Diarrhea   Penicillins Itching    IgE = 154 (WNL) on 01/05/2023   Relafen [Nabumetone]     Palpitation    Sulfa Antibiotics    Chlorhexidine Rash   Lovastatin Diarrhea       Latest Ref Rng & Units 06/22/2023   12:28 PM 04/02/2023    5:17 AM 04/01/2023    4:35 AM  CBC  WBC 4.0 - 10.5 K/uL 5.2  9.6  10.0   Hemoglobin 12.0 - 15.0 g/dL 69.6  8.3  8.1   Hematocrit 36.0 - 46.0 % 34.8  25.2  24.9   Platelets 150 - 400 K/uL 168  297  306       CMP     Component Value Date/Time   NA 137 06/22/2023 1228   K 3.6 06/22/2023 1228   CL 100 06/22/2023 1228   CO2 27 06/22/2023 1228   GLUCOSE 91 06/22/2023 1228   BUN 24 (H) 06/22/2023 1228   CREATININE 1.03 (H) 06/22/2023 1228   CALCIUM 9.3 06/22/2023 1228   PROT 7.0 06/22/2023 1228   ALBUMIN  3.8 06/22/2023 1228   AST 47 (H) 06/22/2023 1228   ALT 36 06/22/2023 1228   ALKPHOS 91 06/22/2023 1228   BILITOT 0.6 06/22/2023 1228   GFRNONAA 55 (L) 06/22/2023 1228     No results found.     Assessment & Plan:   1. Bilateral carotid artery stenosis Recommend:  Given the patient's asymptomatic subcritical stenosis no further invasive testing or surgery at this time.  Duplex ultrasound shows 1-39% stenosis of the right and a widely patent left ica stent.  Continue antiplatelet therapy as prescribed Continue management of CAD, HTN and Hyperlipidemia Healthy heart diet,  encouraged exercise at least 4 times per week  Follow up in 6 months with duplex ultrasound and physical exam   2. Primary hypertension Continue antihypertensive medications as already ordered, these medications have been reviewed and there are no changes at this time.     Current Outpatient Medications on File Prior to Visit  Medication Sig Dispense Refill   acetaminophen (TYLENOL) 325 MG tablet Take 2 tablets (650 mg total) by mouth every 6 (six) hours as needed for mild pain (or Fever >/= 101).     allopurinol (ZYLOPRIM) 300 MG tablet Take 1 tablet (300 mg total) by mouth daily. 30 tablet 0   ALPRAZolam (XANAX) 0.25 MG tablet Take 1 tablet (0.25 mg total) by mouth 2 (two) times daily as needed for anxiety. 5 tablet 0   atorvastatin (LIPITOR) 80 MG tablet Take 1 tablet (80 mg total) by mouth daily. 30 tablet 11   ciprofloxacin (CIPRO) 500 MG tablet Take 1 tablet (500 mg total) by mouth 2 (two) times daily. 60 tablet 1   citalopram (CELEXA) 20 MG tablet Take 1 tablet (20 mg  total) by mouth daily. 30 tablet 0   clopidogrel (PLAVIX) 75 MG tablet Take 1 tablet (75 mg total) by mouth daily. 30 tablet 6   melatonin 5 MG TABS Take 1 tablet (5 mg total) by mouth at bedtime. 30 tablet 0   metoprolol tartrate (LOPRESSOR) 25 MG tablet Take 1 tablet (25 mg total) by mouth 2 (two) times daily. 60 tablet 0   nystatin  (MYCOSTATIN/NYSTOP) powder Apply 1 Application topically 3 (three) times daily. 15 g 0   potassium chloride (KLOR-CON) 10 MEQ tablet Take 1 tablet (10 mEq total) by mouth daily. 30 tablet 0   torsemide (DEMADEX) 20 MG tablet Take 1 tablet (20 mg total) by mouth daily. 30 tablet 0   traMADol (ULTRAM) 50 MG tablet Take 50 mg by mouth every 4 (four) hours as needed.     No current facility-administered medications on file prior to visit.    There are no Patient Instructions on file for this visit. No follow-ups on file.   Georgiana Spinner, NP

## 2023-08-30 ENCOUNTER — Other Ambulatory Visit (INDEPENDENT_AMBULATORY_CARE_PROVIDER_SITE_OTHER): Payer: Self-pay | Admitting: Vascular Surgery

## 2023-09-19 NOTE — Progress Notes (Signed)
 Chief Complaint: Chief Complaint  Patient presents with  . Follow-up    Subjective: Brooke Davis is a 83 y.o. female in today for: Follow-up Patient is brought in today by her daughter for follow-up.  Doing reasonably well.  Still having a lot of pain with her left knee.  Not ready to have this replaced as of yet.  Did have some complications after her hip surgery.  Taking her medications as directed.  Past Medical History:  Diagnosis Date  . Arthritis   . Carotid stenosis, symptomatic, with infarction (CMS/HHS-HCC) 02/25/2023  . Chronic diastolic CHF (congestive heart failure) (CMS/HHS-HCC) 01/28/2023   Last Assessment & Plan:   Formatting of this note might be different from the original.  - We will continue Demadex , Entresto  and Lopressor .    . Coronary artery disease involving native coronary artery of native heart without angina pectoris 11/17/2021  . Diabetes mellitus type 2, uncomplicated (CMS/HHS-HCC)    DIET CONTROLLED  . Embolic cerebral infarction (CMS/HHS-HCC) 02/02/2023  . Gout, joint   . Hypertension    Past Surgical History:  Procedure Laterality Date  . HYSTERECTOMY  07/20/1982  . INTRO CATH TO MAIN PULMONARY ARTERY/RIGHT HEART  11/18/1999  . Right total hip arthroplasty  01/18/2023   Dr Mardee  . Left carotid stent  02/25/2023   Dr. Marea  . RIGHT HIP IRRIGATION AND DEBRIDEMENT OF INFECTED TOTAL JOINT, EXCHANGE OF HEAD AND POLY COMPONENTS WITH PLACEMENT OF ANTIBIOTIC BEADS  03/21/2023   Dr Zalfonte   Family History  Problem Relation Name Age of Onset  . Diabetes type II Other ##Other1   . Rheum arthritis Maternal Grandmother     Current Outpatient Medications on File Prior to Visit  Medication Sig Dispense Refill  . acetaminophen  (TYLENOL ) 325 MG tablet Take 650 mg by mouth every 6 (six) hours as needed    . allopurinoL  (ZYLOPRIM ) 300 MG tablet TAKE 1 TABLET(300 MG) BY MOUTH EVERY DAY 90 tablet 3  . ALPRAZolam  (XANAX ) 0.25 MG tablet TAKE 1 TABLET(0.25  MG) BY MOUTH TWICE DAILY AS NEEDED FOR SLEEP OR STRESS OR ANXIETY 180 tablet 1  . atorvastatin  (LIPITOR ) 80 MG tablet Take 1 tablet (80 mg total) by mouth once daily 90 tablet 2  . citalopram  (CELEXA ) 20 MG tablet Take 1 tablet (20 mg total) by mouth once daily 30 tablet 11  . clopidogreL  (PLAVIX ) 75 mg tablet Take 75 mg by mouth once daily    . metoprolol  tartrate (LOPRESSOR ) 25 MG tablet Take 1 tablet (25 mg total) by mouth 2 (two) times daily 60 tablet 11  . potassium chloride  (KLOR-CON ) 10 MEQ ER tablet Take 1 tablet (10 mEq total) by mouth once daily 30 tablet 11  . TORsemide  (DEMADEX ) 20 MG tablet TAKE 1 TABLET(20 MG) BY MOUTH EVERY DAY 30 tablet 11   No current facility-administered medications on file prior to visit.    Review of Systems - 10 systems negative except as in HPI Objective: Vitals:   09/20/23 1424  BP: 120/78  Pulse: 61   Body mass index is 34.39 kg/m.  In general she is in no acute distress, alert and oriented x 3 Lungs are clear to auscultation bilaterally Heart is irregularly irregular no murmurs rubs or gallops Extremities without cyanosis clubbing, does have mild swelling of the left knee Assessment & Plan: Diagnoses and all orders for this visit:  Chronic pain of left knee Encouraged her to talk with orthopedics when she was ready, patient would like to know  if could be done with epidural versus general anesthesia Multiple joint pain -     traMADoL  (ULTRAM ) 50 mg tablet; Take 2 tablets (100 mg total) by mouth every 8 (eight) hours as needed for Pain Encouraged her to take the tramadol  as needed for pain to be able to be more mobile Chronic pain of multiple joints -     traMADoL  (ULTRAM ) 50 mg tablet; Take 2 tablets (100 mg total) by mouth every 8 (eight) hours as needed for Pain  Essential hypertension, benign Good control, continue current medication, continue to monitor Coronary artery disease involving native coronary artery of native heart without  angina pectoris Asymptomatic, continue to follow with cardiology Paroxysmal atrial fibrillation (CMS/HHS-HCC) Currently in an irregular rhythm, continue to follow with cardiology, asymptomatic Controlled type 2 diabetes mellitus with hyperglycemia, without long-term current use of insulin  (CMS/HHS-HCC) Good control, continue to monitor diet for sweets and carbohydrates Chronic diastolic CHF (congestive heart failure) (CMS/HHS-HCC) Stable, continue to follow with cardiology Type 2 diabetes mellitus with stage 3 chronic kidney disease, without long-term current use of insulin , unspecified whether stage 3a or 3b CKD (CMS/HHS-HCC)  Stage 3a chronic kidney disease (CMS/HHS-HCC) Stable, continue adequate fluids Other orders -     nystatin  (MYCOSTATIN ) 100,000 unit/gram powder; Apply 1 Application topically 3 (three) times daily -     Follow up in Primary Care; Future        Follow-up     Future Labs/Procedures Expected by Expires   Follow up in Primary Care [[id number] Custom]  03/22/2024 11/19/2024   Questions:     Does this order need to be coordinated with another visit or should it be hidden from the patient portal? If yes to either, the patient will need to stop by the front desk or call to schedule.: No   Who is this follow-up with?: PCP   What type of follow up is needed?: Physical   What's the reason for follow up?:

## 2023-09-29 ENCOUNTER — Emergency Department
Admission: EM | Admit: 2023-09-29 | Discharge: 2023-09-29 | Discharge status: Against Medical Advice | Attending: Emergency Medicine | Admitting: Emergency Medicine

## 2023-09-29 ENCOUNTER — Emergency Department

## 2023-09-29 ENCOUNTER — Encounter: Payer: Self-pay | Admitting: Emergency Medicine

## 2023-09-29 ENCOUNTER — Other Ambulatory Visit: Payer: Self-pay

## 2023-09-29 ENCOUNTER — Encounter (INDEPENDENT_AMBULATORY_CARE_PROVIDER_SITE_OTHER): Payer: Self-pay

## 2023-09-29 ENCOUNTER — Other Ambulatory Visit (INDEPENDENT_AMBULATORY_CARE_PROVIDER_SITE_OTHER): Payer: Self-pay | Admitting: Vascular Surgery

## 2023-09-29 ENCOUNTER — Encounter (INDEPENDENT_AMBULATORY_CARE_PROVIDER_SITE_OTHER): Payer: Self-pay | Admitting: Vascular Surgery

## 2023-09-29 DIAGNOSIS — Z5321 Procedure and treatment not carried out due to patient leaving prior to being seen by health care provider: Secondary | ICD-10-CM | POA: Insufficient documentation

## 2023-09-29 DIAGNOSIS — R531 Weakness: Secondary | ICD-10-CM | POA: Insufficient documentation

## 2023-09-29 DIAGNOSIS — R443 Hallucinations, unspecified: Secondary | ICD-10-CM | POA: Insufficient documentation

## 2023-09-29 LAB — CBC
HCT: 35.2 % — ABNORMAL LOW (ref 36.0–46.0)
Hemoglobin: 11.8 g/dL — ABNORMAL LOW (ref 12.0–15.0)
MCH: 34.5 pg — ABNORMAL HIGH (ref 26.0–34.0)
MCHC: 33.5 g/dL (ref 30.0–36.0)
MCV: 102.9 fL — ABNORMAL HIGH (ref 80.0–100.0)
Platelets: 220 10*3/uL (ref 150–400)
RBC: 3.42 MIL/uL — ABNORMAL LOW (ref 3.87–5.11)
RDW: 13.8 % (ref 11.5–15.5)
WBC: 7.2 10*3/uL (ref 4.0–10.5)
nRBC: 0 % (ref 0.0–0.2)

## 2023-09-29 LAB — BASIC METABOLIC PANEL
Anion gap: 12 (ref 5–15)
BUN: 25 mg/dL — ABNORMAL HIGH (ref 8–23)
CO2: 28 mmol/L (ref 22–32)
Calcium: 9.5 mg/dL (ref 8.9–10.3)
Chloride: 98 mmol/L (ref 98–111)
Creatinine, Ser: 1.25 mg/dL — ABNORMAL HIGH (ref 0.44–1.00)
GFR, Estimated: 44 mL/min — ABNORMAL LOW (ref >60)
Glucose, Bld: 140 mg/dL — ABNORMAL HIGH (ref 70–99)
Potassium: 3.5 mmol/L (ref 3.5–5.1)
Sodium: 138 mmol/L (ref 135–145)

## 2023-09-29 NOTE — Telephone Encounter (Signed)
 I called patient's daughter regarding her concerns.  I also advised her that in cases like this where there may be urgent concerns it may be best to call the office versus relying on MyChart as this can be delayed in transmission.  However given the patient's symptoms that she is describing, I have advised her to present to the emergency room for evaluation.  I am concerned that her symptoms are something more acute that would require more significant workup in the carotid duplex that we would be able to do in the office.  She notes that she will discuss with her mother and attempt to have her evaluated at the emergency room.

## 2023-09-29 NOTE — ED Triage Notes (Signed)
 Patient to ED via POV for generalized weakness. PT reports jerking and hallucinations- daughter reports pt having hallucinations. Symptoms ongoing since Saturday. Pt's daughter states pt's text messages don't make sense where as they normally would.

## 2023-10-08 ENCOUNTER — Telehealth (INDEPENDENT_AMBULATORY_CARE_PROVIDER_SITE_OTHER): Payer: Self-pay

## 2023-10-08 NOTE — Telephone Encounter (Signed)
 Brooke Davis called stating that we had rejected Brooke Davis Plavix. It shows we sent in 90 qty in with 1 refill on 09/29/23. Brooke Davis's stated she will go and pick it up.

## 2023-10-12 ENCOUNTER — Other Ambulatory Visit (INDEPENDENT_AMBULATORY_CARE_PROVIDER_SITE_OTHER): Payer: Self-pay | Admitting: Vascular Surgery

## 2023-10-13 ENCOUNTER — Other Ambulatory Visit (INDEPENDENT_AMBULATORY_CARE_PROVIDER_SITE_OTHER): Payer: Self-pay | Admitting: Vascular Surgery

## 2023-12-07 ENCOUNTER — Encounter (INDEPENDENT_AMBULATORY_CARE_PROVIDER_SITE_OTHER): Payer: Self-pay

## 2024-01-05 ENCOUNTER — Other Ambulatory Visit (INDEPENDENT_AMBULATORY_CARE_PROVIDER_SITE_OTHER): Payer: Self-pay | Admitting: Vascular Surgery

## 2024-01-05 DIAGNOSIS — I6523 Occlusion and stenosis of bilateral carotid arteries: Secondary | ICD-10-CM

## 2024-01-06 ENCOUNTER — Ambulatory Visit (INDEPENDENT_AMBULATORY_CARE_PROVIDER_SITE_OTHER): Payer: Medicare Other | Admitting: Nurse Practitioner

## 2024-01-06 ENCOUNTER — Encounter (INDEPENDENT_AMBULATORY_CARE_PROVIDER_SITE_OTHER): Payer: Medicare Other

## 2024-01-13 ENCOUNTER — Other Ambulatory Visit (INDEPENDENT_AMBULATORY_CARE_PROVIDER_SITE_OTHER): Payer: Self-pay | Admitting: Vascular Surgery

## 2024-01-24 ENCOUNTER — Emergency Department

## 2024-01-24 ENCOUNTER — Inpatient Hospital Stay
Admission: EM | Admit: 2024-01-24 | Discharge: 2024-01-27 | DRG: 061 | Disposition: A | Discharge status: Home Health | Attending: Family Medicine | Admitting: Family Medicine

## 2024-01-24 DIAGNOSIS — R451 Restlessness and agitation: Secondary | ICD-10-CM | POA: Diagnosis present

## 2024-01-24 DIAGNOSIS — R32 Unspecified urinary incontinence: Secondary | ICD-10-CM | POA: Diagnosis present

## 2024-01-24 DIAGNOSIS — Z8673 Personal history of transient ischemic attack (TIA), and cerebral infarction without residual deficits: Secondary | ICD-10-CM

## 2024-01-24 DIAGNOSIS — E119 Type 2 diabetes mellitus without complications: Secondary | ICD-10-CM | POA: Diagnosis present

## 2024-01-24 DIAGNOSIS — I6381 Other cerebral infarction due to occlusion or stenosis of small artery: Principal | ICD-10-CM | POA: Diagnosis present

## 2024-01-24 DIAGNOSIS — Z7189 Other specified counseling: Secondary | ICD-10-CM | POA: Diagnosis not present

## 2024-01-24 DIAGNOSIS — G928 Other toxic encephalopathy: Secondary | ICD-10-CM | POA: Diagnosis present

## 2024-01-24 DIAGNOSIS — I2510 Atherosclerotic heart disease of native coronary artery without angina pectoris: Secondary | ICD-10-CM | POA: Diagnosis present

## 2024-01-24 DIAGNOSIS — R29705 NIHSS score 5: Secondary | ICD-10-CM | POA: Diagnosis present

## 2024-01-24 DIAGNOSIS — I639 Cerebral infarction, unspecified: Secondary | ICD-10-CM | POA: Diagnosis not present

## 2024-01-24 DIAGNOSIS — N820 Vesicovaginal fistula: Secondary | ICD-10-CM | POA: Diagnosis present

## 2024-01-24 DIAGNOSIS — I6523 Occlusion and stenosis of bilateral carotid arteries: Secondary | ICD-10-CM | POA: Diagnosis not present

## 2024-01-24 DIAGNOSIS — I252 Old myocardial infarction: Secondary | ICD-10-CM

## 2024-01-24 DIAGNOSIS — F32A Depression, unspecified: Secondary | ICD-10-CM | POA: Diagnosis present

## 2024-01-24 DIAGNOSIS — E785 Hyperlipidemia, unspecified: Secondary | ICD-10-CM | POA: Diagnosis present

## 2024-01-24 DIAGNOSIS — J90 Pleural effusion, not elsewhere classified: Secondary | ICD-10-CM | POA: Diagnosis present

## 2024-01-24 DIAGNOSIS — I482 Chronic atrial fibrillation, unspecified: Secondary | ICD-10-CM | POA: Diagnosis present

## 2024-01-24 DIAGNOSIS — E860 Dehydration: Secondary | ICD-10-CM | POA: Diagnosis present

## 2024-01-24 DIAGNOSIS — R531 Weakness: Secondary | ICD-10-CM | POA: Diagnosis present

## 2024-01-24 DIAGNOSIS — I6521 Occlusion and stenosis of right carotid artery: Secondary | ICD-10-CM | POA: Diagnosis present

## 2024-01-24 DIAGNOSIS — R9082 White matter disease, unspecified: Secondary | ICD-10-CM | POA: Diagnosis present

## 2024-01-24 DIAGNOSIS — I7090 Unspecified atherosclerosis: Secondary | ICD-10-CM | POA: Diagnosis not present

## 2024-01-24 DIAGNOSIS — Z96641 Presence of right artificial hip joint: Secondary | ICD-10-CM | POA: Diagnosis present

## 2024-01-24 DIAGNOSIS — R29810 Facial weakness: Secondary | ICD-10-CM | POA: Diagnosis present

## 2024-01-24 DIAGNOSIS — M069 Rheumatoid arthritis, unspecified: Secondary | ICD-10-CM | POA: Diagnosis present

## 2024-01-24 DIAGNOSIS — F419 Anxiety disorder, unspecified: Secondary | ICD-10-CM | POA: Diagnosis present

## 2024-01-24 DIAGNOSIS — N390 Urinary tract infection, site not specified: Secondary | ICD-10-CM | POA: Diagnosis present

## 2024-01-24 DIAGNOSIS — R197 Diarrhea, unspecified: Secondary | ICD-10-CM | POA: Diagnosis present

## 2024-01-24 DIAGNOSIS — I6389 Other cerebral infarction: Secondary | ICD-10-CM | POA: Diagnosis not present

## 2024-01-24 DIAGNOSIS — R1110 Vomiting, unspecified: Secondary | ICD-10-CM | POA: Diagnosis present

## 2024-01-24 DIAGNOSIS — R4701 Aphasia: Principal | ICD-10-CM

## 2024-01-24 DIAGNOSIS — I10 Essential (primary) hypertension: Secondary | ICD-10-CM | POA: Diagnosis present

## 2024-01-24 LAB — COMPREHENSIVE METABOLIC PANEL WITH GFR
ALT: 22 U/L (ref 0–44)
AST: 33 U/L (ref 15–41)
Albumin: 4 g/dL (ref 3.5–5.0)
Alkaline Phosphatase: 85 U/L (ref 38–126)
Anion gap: 13 (ref 5–15)
BUN: 24 mg/dL — ABNORMAL HIGH (ref 8–23)
CO2: 28 mmol/L (ref 22–32)
Calcium: 10 mg/dL (ref 8.9–10.3)
Chloride: 98 mmol/L (ref 98–111)
Creatinine, Ser: 0.97 mg/dL (ref 0.44–1.00)
GFR, Estimated: 59 mL/min — ABNORMAL LOW (ref >60)
Glucose, Bld: 148 mg/dL — ABNORMAL HIGH (ref 70–99)
Potassium: 3.8 mmol/L (ref 3.5–5.1)
Sodium: 139 mmol/L (ref 135–145)
Total Bilirubin: 0.6 mg/dL (ref 0.0–1.2)
Total Protein: 7.5 g/dL (ref 6.5–8.1)

## 2024-01-24 LAB — CBC
HCT: 37.6 % (ref 36.0–46.0)
Hemoglobin: 12.5 g/dL (ref 12.0–15.0)
MCH: 34.9 pg — ABNORMAL HIGH (ref 26.0–34.0)
MCHC: 33.2 g/dL (ref 30.0–36.0)
MCV: 105 fL — ABNORMAL HIGH (ref 80.0–100.0)
Platelets: 206 K/uL (ref 150–400)
RBC: 3.58 MIL/uL — ABNORMAL LOW (ref 3.87–5.11)
RDW: 13.4 % (ref 11.5–15.5)
WBC: 7.5 K/uL (ref 4.0–10.5)
nRBC: 0 % (ref 0.0–0.2)

## 2024-01-24 LAB — DIFFERENTIAL
Abs Immature Granulocytes: 0.02 K/uL (ref 0.00–0.07)
Basophils Absolute: 0 K/uL (ref 0.0–0.1)
Basophils Relative: 0 %
Eosinophils Absolute: 0.2 K/uL (ref 0.0–0.5)
Eosinophils Relative: 2 %
Immature Granulocytes: 0 %
Lymphocytes Relative: 28 %
Lymphs Abs: 2.1 K/uL (ref 0.7–4.0)
Monocytes Absolute: 0.5 K/uL (ref 0.1–1.0)
Monocytes Relative: 6 %
Neutro Abs: 4.8 K/uL (ref 1.7–7.7)
Neutrophils Relative %: 64 %

## 2024-01-24 LAB — APTT: aPTT: 34 s (ref 24–36)

## 2024-01-24 LAB — ETHANOL: Alcohol, Ethyl (B): <15 mg/dL (ref <15)

## 2024-01-24 LAB — PROTIME-INR
INR: 1.1 (ref 0.8–1.2)
Prothrombin Time: 14.8 s (ref 11.4–15.2)

## 2024-01-24 MED ORDER — ACETAMINOPHEN 650 MG RE SUPP: 650.0000 mg | RECTAL | Status: DC | PRN

## 2024-01-24 MED ORDER — PROCHLORPERAZINE EDISYLATE 10 MG/2ML IJ SOLN
INTRAMUSCULAR | Status: AC
  Administered 2024-01-24: 10 mg via INTRAVENOUS
  Filled 2024-01-24: qty 2

## 2024-01-24 MED ORDER — TENECTEPLASE FOR STROKE
0.2500 mg/kg | PACK | Freq: Once | INTRAVENOUS | Status: AC
  Administered 2024-01-24: 23 mg via INTRAVENOUS

## 2024-01-24 MED ORDER — ACETAMINOPHEN 160 MG/5ML PO SOLN: 650.0000 mg | ORAL | Status: DC | PRN

## 2024-01-24 MED ORDER — ONDANSETRON HCL 4 MG/2ML IJ SOLN
INTRAMUSCULAR | Status: AC
  Administered 2024-01-24: 4 mg
  Filled 2024-01-24: qty 2

## 2024-01-24 MED ORDER — CLEVIDIPINE BUTYRATE 0.5 MG/ML IV EMUL: 0.0000 mg/h | INTRAVENOUS | Status: DC

## 2024-01-24 MED ORDER — SODIUM CHLORIDE 0.9 % IV SOLN
12.5000 mg | Freq: Once | INTRAVENOUS | Status: AC
  Administered 2024-01-24: 12.5 mg via INTRAVENOUS
  Filled 2024-01-24: qty 12.5

## 2024-01-24 MED ORDER — DEXMEDETOMIDINE HCL IN NACL 400 MCG/100ML IV SOLN
INTRAVENOUS | Status: AC
  Administered 2024-01-24: 0.4 ug/kg/h via INTRAVENOUS
  Filled 2024-01-24: qty 100

## 2024-01-24 MED ORDER — ACETAMINOPHEN 325 MG PO TABS
650.0000 mg | ORAL_TABLET | ORAL | Status: DC | PRN
  Administered 2024-01-26 (×2): 650 mg via ORAL
  Filled 2024-01-24 (×2): qty 2

## 2024-01-24 MED ORDER — SODIUM CHLORIDE 0.9% FLUSH: 3.0000 mL | Freq: Once | INTRAVENOUS | Status: DC

## 2024-01-24 MED ORDER — CLEVIDIPINE BUTYRATE 0.5 MG/ML IV EMUL
INTRAVENOUS | Status: AC
  Filled 2024-01-24: qty 100

## 2024-01-24 MED ORDER — DEXMEDETOMIDINE HCL IN NACL 400 MCG/100ML IV SOLN: 0.0000 ug/kg/h | INTRAVENOUS | Status: DC

## 2024-01-24 MED ORDER — MORPHINE SULFATE (PF) 2 MG/ML IV SOLN: 1.0000 mg | INTRAVENOUS | Status: DC | PRN

## 2024-01-24 MED ORDER — LABETALOL HCL 5 MG/ML IV SOLN
INTRAVENOUS | Status: AC
  Administered 2024-01-24: 20 mg
  Filled 2024-01-24: qty 4

## 2024-01-24 MED ORDER — STROKE: EARLY STAGES OF RECOVERY BOOK: Freq: Once | Status: AC

## 2024-01-24 MED ORDER — SENNOSIDES-DOCUSATE SODIUM 8.6-50 MG PO TABS: 1.0000 | ORAL_TABLET | Freq: Every evening | ORAL | Status: DC | PRN

## 2024-01-24 MED ORDER — SODIUM CHLORIDE 0.9 % IV SOLN: INTRAVENOUS | Status: AC

## 2024-01-24 MED ORDER — METOPROLOL TARTRATE 5 MG/5ML IV SOLN
INTRAVENOUS | Status: AC
  Filled 2024-01-24: qty 5

## 2024-01-24 MED ORDER — PROCHLORPERAZINE EDISYLATE 10 MG/2ML IJ SOLN: 10.0000 mg | Freq: Once | INTRAMUSCULAR | Status: AC

## 2024-01-24 MED ORDER — PANTOPRAZOLE SODIUM 40 MG IV SOLR
40.0000 mg | Freq: Every day | INTRAVENOUS | Status: DC
  Administered 2024-01-25: 40 mg via INTRAVENOUS
  Filled 2024-01-24: qty 10

## 2024-01-24 MED ORDER — IOHEXOL 350 MG/ML SOLN
100.0000 mL | Freq: Once | INTRAVENOUS | Status: AC | PRN
  Administered 2024-01-24: 100 mL via INTRAVENOUS

## 2024-01-24 NOTE — ED Provider Notes (Signed)
 [facility] Provider Note    Event Date/Time   First MD Initiated Contact with Patient 01/24/24 2057     (approximate)   History   Code Stroke   HPI  Brooke Davis is a 83 year old female with history of hypertension, diabetes, multiple prior strokes, A-fib not on anticoagulation presenting to the emergency department for evaluation of aphasia as a code stroke.  Last known well 1930.  Symptoms noted at 1950.  Noted to have onset of aphasia, family concern for left-sided facial droop.      Physical Exam   Triage Vital Signs: ED Triage Vitals  Encounter Vitals Group     BP 01/24/24 2126 (!) 168/94     Girls Systolic BP Percentile --      Girls Diastolic BP Percentile --      Boys Systolic BP Percentile --      Boys Diastolic BP Percentile --      Pulse Rate 01/24/24 2126 (!) 108     Resp 01/24/24 2126 18     Temp 01/24/24 2153 98.2 F (36.8 C)     Temp Source 01/24/24 2153 Oral     SpO2 01/24/24 2121 97 %     Weight 01/24/24 2129 203 lb 6.4 oz (92.3 kg)     Height --      Head Circumference --      Peak Flow --      Pain Score --      Pain Loc --      Pain Education --      Exclude from Growth Chart --     Most recent vital signs: Vitals:   01/24/24 2219 01/24/24 2220  BP:    Pulse: (!) 139 (!) 101  Resp: 17 20  Temp:    SpO2: 99% 100%     General: Awake, interactive  CV:  Intermittent tachycardia, good peripheral perfusion.  Resp:  Unlabored respirations.  Abd:  Nondistended.  Neuro:  Keenly aware, unable to answer month or age, able to blink eyes and squeeze hands, intact extraocular movements, no appreciable field cut, no appreciable facial asymmetry on my evaluation, no arm or leg drift, no ataxia, sensation appears intact, intermittent aphasia noted without dysarthria, no inattention   ED Results / Procedures / Treatments   Labs (all labs ordered are listed, but only abnormal results are displayed) Labs Reviewed  CBC -  Abnormal; Notable for the following components:      Result Value   RBC 3.58 (*)    MCV 105.0 (*)    MCH 34.9 (*)    All other components within normal limits  COMPREHENSIVE METABOLIC PANEL WITH GFR - Abnormal; Notable for the following components:   Glucose, Bld 148 (*)    BUN 24 (*)    GFR, Estimated 59 (*)    All other components within normal limits  PROTIME-INR  APTT  DIFFERENTIAL  ETHANOL  LIPID PANEL  HEMOGLOBIN A1C  CBG MONITORING, ED     EKG EKG independently reviewed and interpreted by myself demonstrates:  EKG demonstrates A-fib at a rate of 121, QRS 98, QTc 386, no acute ST changes  RADIOLOGY Imaging independently reviewed and interpreted by myself demonstrates:  Noncontrast head CT without appreciable bleed CTA without LVO  Formal Radiology Read:  CT ANGIO HEAD NECK W WO CM W PERF (CODE STROKE) Result Date: 01/24/2024 CLINICAL DATA:  Neuro deficit, acute, stroke suspected EXAM: CT ANGIOGRAPHY HEAD AND NECK CT PERFUSION BRAIN TECHNIQUE: Multidetector  CT imaging of the head and neck was performed using the standard protocol during bolus administration of intravenous contrast. Multiplanar CT image reconstructions and MIPs were obtained to evaluate the vascular anatomy. Carotid stenosis measurements (when applicable) are obtained utilizing NASCET criteria, using the distal internal carotid diameter as the denominator. Multiphase CT imaging of the brain was performed following IV bolus contrast injection. Subsequent parametric perfusion maps were calculated using RAPID software. RADIATION DOSE REDUCTION: This exam was performed according to the departmental dose-optimization program which includes automated exposure control, adjustment of the mA and/or kV according to patient size and/or use of iterative reconstruction technique. CONTRAST:  OMNIPAQUE  IOHEXOL  350 MG/ML SOLN COMPARISON:  CTA head neck 01/29/2023. FINDINGS: CTA NECK FINDINGS Limited evaluation due to poor  contrast bolus timing. Within this limitation: Aortic arch: Great vessel origins are patent without significant stenosis Right carotid system: Atherosclerosis at carotid bifurcation with approximately 80% stenosis. Left carotid system: Carotid stent in place which traverses the carotid bifurcation and is patent. Vertebral arteries: Left dominant. Similar occlusion of the distal right vertebral artery with poor opacification proximally. Left vertebral artery is patent with similar moderate origin stenosis. Skeleton: No acute abnormality on limited assessment. Other neck: No acute abnormality on limited assessment. Upper chest: No acute abnormality on limited assessment. Review of the MIP images confirms the above findings CTA HEAD FINDINGS Limited evaluation due to poor contrast bolus timing. Within this limitation: Anterior circulation: Atherosclerosis of bilateral intracranial ICAs. The intracranial ICAs and proximal MCAs are patent. The proximal ACAs are patent bilaterally. Limited distal MCA and ACA evaluation due to poor contrast bolus timing and motion. Posterior circulation: Similar severe atherosclerotic narrowing of the left intradural vertebral artery proximally. Poorly opacified right intradural vertebral artery, chronic. The basilar artery and bilateral posterior arteries are patent. Similar small right P1 PCA with bilateral P2 PCA stenoses. Mid to distal PCAs are poorly evaluated due to contrast bolus timing. Venous sinuses: Not well evaluated. Review of the MIP images confirms the above findings CT Brain Perfusion Findings: ASPECTS: 10. CBF (<30%) Volume: 0mL Perfusion (Tmax>6.0s) volume: 37mL Mismatch Volume: 37mL Infarction Location:No core infarct identified. IMPRESSION: CTA: 1. Limited study without evidence of proximal large vessel occlusion. 2. 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 3. Interval stenting of the left carotid bifurcation/proximal ICA with patent  stent. 4. Similar bilateral P2 PCA stenosis. 5. 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 6. Similar moderate left vertebral artery origin stenosis. CT perfusion: 1. Approximately 37 mL of reported penumbra, predominantly in the right cerebral hemisphere/ICA territory and potentially due to underlying right ICA stenosis and/or artifact. The degree of penumbra may be overestimated given likely artifactual mismatch in the inferior cerebellum. 2. No evidence of core infarct. An MRI could provide more sensitive evaluation for acute infarct if clinically warranted. Electronically Signed   By: Gilmore GORMAN Molt M.D.   On: 01/24/2024 21:42   CT HEAD CODE STROKE WO CONTRAST Result Date: 01/24/2024 CLINICAL DATA:  Code stroke.  Neuro deficit, acute, stroke suspected EXAM: CT HEAD WITHOUT CONTRAST TECHNIQUE: Contiguous axial images were obtained from the base of the skull through the vertex without intravenous contrast. RADIATION DOSE REDUCTION: This exam was performed according to the departmental dose-optimization program which includes automated exposure control, adjustment of the mA and/or kV according to patient size and/or use of iterative reconstruction technique. COMPARISON:  None Available. FINDINGS: Motion limited study.  Within this limitation Brain: No evidence of acute large vascular territory infarct acute  hemorrhage mass lesion, midline shift or hydrocephalus. Advanced patchy and white matter hypodensities compatible with microvascular ischemic change. Vascular: No hyperdense vessel or unexpected calcification. Skull: Normal. Negative for fracture or focal lesion. Sinuses/Orbits: No acute finding. ASPECTS [facility] Stroke Program Early CT Score) Total score (0-10 with 10 being normal): 10. IMPRESSION: 1. Motion limited study without evidence of acute large vascular territory infarct or acute hemorrhage. 2. Advanced microvascular ischemic disease. MRI could provide more sensitive  evaluation for acute infarct if clinically warranted. Electronically Signed   By: Gilmore GORMAN Molt M.D.   On: 01/24/2024 21:13    PROCEDURES:  Critical Care performed: Yes, see critical care procedure note(s)  CRITICAL CARE Performed by: Nilsa Dade   Total critical care time: 32 minutes  Critical care time was exclusive of separately billable procedures and treating other patients.  Critical care was necessary to treat or prevent imminent or life-threatening deterioration.  Critical care was time spent personally by me on the following activities: development of treatment plan with patient and/or surrogate as well as nursing, discussions with consultants, evaluation of patient's response to treatment, examination of patient, obtaining history from patient or surrogate, ordering and performing treatments and interventions, ordering and review of laboratory studies, ordering and review of radiographic studies, pulse oximetry and re-evaluation of patient's condition.   Procedures   MEDICATIONS ORDERED IN ED: Medications  sodium chloride  flush (NS) 0.9 % injection 3 mL (0 mLs Intravenous Hold 01/24/24 2147)  metoprolol  tartrate (LOPRESSOR ) 5 MG/5ML injection (0 mg  Hold 01/24/24 2147)  promethazine  (PHENERGAN ) 12.5 mg in sodium chloride  0.9 % 50 mL IVPB (12.5 mg Intravenous New Bag/Given 01/24/24 2226)   stroke: early stages of recovery book (has no administration in time range)  0.9 %  sodium chloride  infusion (has no administration in time range)  acetaminophen  (TYLENOL ) tablet 650 mg (has no administration in time range)    Or  acetaminophen  (TYLENOL ) 160 MG/5ML solution 650 mg (has no administration in time range)    Or  acetaminophen  (TYLENOL ) suppository 650 mg (has no administration in time range)  senna-docusate (Senokot-S) tablet 1 tablet (has no administration in time range)  pantoprazole  (PROTONIX ) injection 40 mg (has no administration in time range)  iohexol  (OMNIPAQUE ) 350  MG/ML injection 100 mL (100 mLs Intravenous Contrast Given 01/24/24 2103)  tenecteplase  (TNKASE ) injection for Stroke 23 mg (23 mg Intravenous Given 01/24/24 2150)  labetalol  (NORMODYNE ) 5 MG/ML injection (20 mg  Given 01/24/24 2144)  ondansetron  (ZOFRAN ) 4 MG/2ML injection (4 mg  Given 01/24/24 2144)     IMPRESSION / MDM / ASSESSMENT AND PLAN / ED COURSE  I reviewed the triage vital signs and the nursing notes.  Differential diagnosis includes, but is not limited to, CVA, TIA, electrolyte abnormality, toxic metabolic encephalopathy  Patient's presentation is most consistent with acute presentation with potential threat to life or bodily function.  83 year old female presenting to the emergency department for evaluation of aphasia as a code stroke.  Imaging without bleed or LVO.  Evaluated by telespecialist team as below who did administer TNK.  Will reach out to ICU team.  Clinical Course as of 01/24/24 2232  [facility] Jan 24, 2024  2117 CT HEAD CODE STROKE WO CONTRAST Contacted by radiology, no obvious bleed or infarct on noncontrast head CT but limited by white matter disease, absence of priors, motion artifact. [NR]  2158 Contacted by Dr. Lorre with telemetry specialist team.  He did give the patient TNK.  She was given 20 labetalol  prior  to this to get blood pressures within range.  He does note that patient did have some vomiting post CT scan, but prior to receiving the TNK.  He does recommend admission for further stroke workup. Patient assessed. Continues to have frequent vomiting, ordered for phenergan .  [NR]  2220 Case discussed with NP Ouma with ICU team, she will evaluate for anticipated admission.  [NR]    Clinical Course User Index [NR] Levander Slate, MD     FINAL CLINICAL IMPRESSION(S) / ED DIAGNOSES   Final diagnoses:  Aphasia  Facial droop     Rx / DC Orders   ED Discharge Orders     None        Note:  This document was prepared using Dragon voice recognition software and  may include unintentional dictation errors.   Levander Slate, MD 01/24/24 2232

## 2024-01-24 NOTE — Consult Note (Signed)
 [facility] Consult Services   Patient Name:   Brooke Davis, Heater Date of Birth:   [birth year] Identification Number:   MRN - [id number] Date of Service:   01/24/2024 20:51:33  Diagnosis:       R47.01 - Aphasia  Impression:      This is an 83 year old woman left-handed with a history of hypertension, diabetes, multiple prior strokes including left internal carotid stent presenting with acute aphasia and slight facial weakness. I discussed risk and benefit of tenecteplase  with her daughter at bedside including risk of symptomatic intracerebral hemorrhage, life-threatening hemorrhage, allergy versus potential functional benefit and she agreed to proceed. There was notable delay getting her blood pressure under control and treating her vomiting before we could treat. No historical contraindications for lysis. AFIB documented in chart by PCP recently but no OAC listed in last note or medication list - unclear if there is a perceived contraindication.    Recommend:  1. CT angiogram shows known right internal carotid artery stenosis but no large vessel occlusion  2. Post TNK care blood pressure goals less than 180/105 mmHg  3. No blood thinners at least for 24 hours  4. MRI of the brain without contrast, transthoracic echo, lipids, A1c and telemetry  5. Bedside swallow evaluation and speech therapy for clearance as needed    CTA:    1. Limited study without evidence of proximal large vessel  occlusion.  2. 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  3. Interval stenting of the left carotid bifurcation/proximal ICA  with patent stent.  4. Similar bilateral P2 PCA stenosis.  5. 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  6. Similar moderate left vertebral artery origin stenosis.    Our recommendations are outlined below. Recommendations: IV Tenecteplase  recommended.  I confirmed the following. (Patient  name, DOB, MRN, Blood Pressure, dose of Thrombolytic and waste, weight completed by stretcher/scale not stated weight, have ED staff inform ED MD of thrombolytic decision) Thrombolytic bolus given Without Complication.   IV Tenecteplase  Total Dose - 23.1 mg (Dose Rounding Per Facility Protocol)   Routine post Thrombolytic monitoring including neuro checks and blood pressure control during/after treatment Monitor blood pressure Check blood pressure and neuro assessment every 15 min for 2 h, then every 30 min for 6 h, and finally every hour for 16 h.  Manage Blood Pressure per post Thrombolytic protocol.        Follow designated hospital protocol for admission and post thrombolytic care       CT brain 24 hours post Thrombolytic       NPO until swallowing screen performed and passed       No antiplatelet agents or anticoagulants (including heparin for DVT prophylaxis) in first 24 hours       No Foley catheter, nasogastric tube, arterial catheter or central venous catheter for 24 hr, unless absolutely necessary       Telemetry       Bedside swallow evaluation       HOB less than 30 degrees       Euglycemia       Avoid hyperthermia, PRN acetaminophen        DVT prophylaxis       Inpatient Neurology Consultation       Stroke evaluation as per inpatient neurology recommendations  Discussed with ED physician    ------------------------------------------------------------------------------  Advanced Imaging: CTA Head and Neck Completed.  LVO:No  Patient is not a candidate for NIR   Metrics:  Last Known Well: 01/24/2024 20:30:00 Dispatch Time: 01/24/2024 20:51:33 Arrival Time: 01/24/2024 20:47:00 Initial Response Time: 01/24/2024 21:03:19 Symptoms: aphasia . Initial patient interaction: 01/24/2024 21:24:38 NIHSS Assessment Completed: 01/24/2024 21:28:52 Patient is a candidate for Thrombolytic. Thrombolytic Medical Decision: 01/24/2024 21:34:12 Needle Time: 01/24/2024  21:51:44 Weight Noted by Staff: 92.3 kg  CT Head: I personally reviewed all the CT images that were available to me and it showed: no clear acute pathology, distal LICA a bit dense, far left frontal hypodensity vs artifact  Primary Provider Notified of Diagnostic Impression and Management Plan on: 01/24/2024 21:59:03    ------------------------------------------------------------------------------  Extended thrombolytic management related to:     More extensive discussion on the management decision was requested by patient or family     Delays related to blood pressure management  Thrombolytic Contraindications:  Last Known Well > 4.5 hours: No CT Head showing hemorrhage: No Ischemic stroke within 3 months: No Severe head trauma within 3 months: No Intracranial/intraspinal surgery within 3 months: No History of intracranial hemorrhage: No Symptoms and signs consistent with an SAH: No GI malignancy or GI bleed within 21 days: No Coagulopathy: Platelets <100 000 /mm3, INR >1.7, aPTT>40 s, or PT >15 s: No Treatment dose of LMWH within the previous 24 hrs: No Use of NOACs in past 48 hours: No Glycoprotein IIb/IIIa receptor inhibitors use: No Symptoms consistent with infective endocarditis: No Suspected aortic arch dissection: No Intra-axial intracranial neoplasm: No  Thrombolytic Decision and Management Plan: Management with thrombolytic treatment was explained to the Family as was risks and benefits and alternatives to the treatment. Patient agrees with the decision to proceed with thrombolytic treatment. . All questions were answered and the Family expressed understanding of the treatment plan.   History of Present Illness: Patient is a 83 year old Female.  Patient was brought by private transportation with symptoms of aphasia . This is an 83 year old left handed woman history of hypertension, AFIB no OAC heart failure, LICA stent multiple strokes, ambulates with walker at  baseline, rheumatoid arthritis, diabetes who was found by a family member with trouble speaking, possible transient facial weakness. Daughter provides history. She defecated herself. She vomited in the ED. She has AFIB but no OAC per chart.   MRI 8/24 1. Expected evolution of the left corona radiata infarct and left parietal lobe infarct. 2. New punctate acute infarcts of the left temporal lobe and lateral left temporal lobe 3. Extensive white matter disease likely reflects the sequela of chronic microvascular ischemia.     Past Medical History:      Hypertension      Diabetes Mellitus      Hyperlipidemia      Atrial Fibrillation      Coronary Artery Disease      Stroke Other PMH:  rheumatoid arthritis , LICA carotid, heard of hearing  Medications:  No Anticoagulant use  Antiplatelet use: Yes plavix  Reviewed EMR for current medications  Allergies:  Reviewed  Social History: Drug Use: No  Family History:  There is no family history of premature cerebrovascular disease pertinent to this consultation  ROS : 14 Points Review of Systems was performed and was negative except mentioned in HPI.  Past Surgical History: There Is No Surgical History Contributory To Today's Visit    Examination: BP(141/90), Pulse(88), Blood Glucose(148) 1A: Level of Consciousness - Alert; keenly responsive + 0 1B: Ask Month and Age - Could Not Answer Either Question Correctly + 2 1C: Blink Eyes & Squeeze Hands - Performs  Both Tasks + 0 2: Test Horizontal Extraocular Movements - Normal + 0 3: Test Visual Fields - No Visual Loss + 0 4: Test Facial Palsy (Use Grimace if Obtunded) - Minor paralysis (flat nasolabial fold, smile asymmetry) + 1 5A: Test Left Arm Motor Drift - No Drift for 10 Seconds + 0 5B: Test Right Arm Motor Drift - No Drift for 10 Seconds + 0 6A: Test Left Leg Motor Drift - No Drift for 5 Seconds + 0 6B: Test Right Leg Motor Drift - No Drift for 5 Seconds + 0 7: Test Limb  Ataxia (FNF/Heel-Shin) - No Ataxia + 0 8: Test Sensation - Normal; No sensory loss + 0 9: Test Language/Aphasia - Severe Aphasia: Fragmentary Expression, Inference Needed, Cannot Identify Materials + 2 10: Test Dysarthria - Normal + 0 11: Test Extinction/Inattention - No abnormality + 0  NIHSS Score: 5 NIHSS Free Text : some agitation    Pre-Morbid Modified Rankin Scale: 2 Points = Slight disability; unable to carry out all previous activities, but able to look after own affairs without assistance  Spoke with : Dr Levander I reviewed the available imaging via Rapid and initiated discussion with the primary provider  This consult was conducted in real time using interactive audio and Immunologist. Patient was informed of the technology being used for this visit and agreed to proceed. Patient located in hospital and provider located at home/office setting.   Patient is being evaluated for possible acute neurologic impairment and high probability of imminent or life-threatening deterioration. I spent total of 45 minutes providing care to this patient, including time for face to face visit via telemedicine, review of medical records, imaging studies and discussion of findings with providers, the patient and/or family.   Dr Vicenta Hunter   [facility] For Inpatient follow-up with [facility] physician please call [facility] at [phone]. As we are not an outpatient service for any post hospital discharge needs please contact the hospital for assistance.  If you have any questions for the [facility] physicians or need to reconsult for clinical or diagnostic changes please contact us  via [facility] at [phone].

## 2024-01-24 NOTE — ED Triage Notes (Signed)
 CODE STROKE   Pt arrived via [facility] from home where she lives alone with frequent family check-ins. Pt last seen normal at 1930, onset of symptoms recognized by family at 22. Symptoms included aphasia and left sided facial droop. Hx/o 4 CVA's.   LVO 3  Agitated on EMS arrival and in route  Pt on Plavix 

## 2024-01-24 NOTE — H&P (Signed)
 NAME:  Brooke Davis, MRN:  [id number], DOB:  07-25-1940, LOS: 0 ADMISSION DATE:  01/24/2024, CONSULTATION DATE: 07/27/2023 REFERRING MD: Nilsa Dade, CHIEF COMPLAINT: Aphasia  HPI  83 y.o  female with significant PMH of HTN, Bilateral Carotid stenosis s/p stenting, Right Hip Joint replacement on 01/18/23 followed by infection of the rt hip leading to I/D and capsule change on 03/21/23, culture positive for Serratia, Chronic Atrial Fibrillation off Apixaban since 02/25/2023, T2DM, CVA, CAD s/p LHC 01/11/2021, Inferior wall STEMI, Anxiety and Depression who presented to the ED as a code stroke with acute onset aphasia and slight left sided facial droop/weakness.   ED Course: Initial vital signs showed HR of 108  beats/minute, BP 168/94 mm Hg, the RR 30 breaths/minute, and the oxygen saturation 100 % on RA and a temperature of 98.14F (36.8C).  Code stroke initiated and patient evaluated by teleneurologist.  Stat CT head CTA angio obtained as below. CXR> negative CTH> CTA Head and Neck> No acute intracranial abnormality Pertinent Labs/Diagnostics Findings: Unremarkable CMP and WBC LKNW:1930 Initial NIHSS:5 Patient deemed candidate for thrombolytics and TNK administered 21:51. PCCM consulted for admission post thrombolytics.  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 followed by infection of the rt hip leading to I/D and capsule change on 03/21/23, culture positive for Serratia, Chronic Atrial Fibrillation off Apixaban since 02/25/2023, T2DM, CVA, CAD s/p LHC 01/11/2021, Inferior wall STEMI, Anxiety and Depression   Significant Hospital Events   7/7: Admitted to the ICU for acute CVA s/p TNK @2151   Consults:  Neurology  Procedures:  None  Interim History / Subjective:      Micro Data:  None  Antimicrobials:  None  OBJECTIVE  Blood pressure (!) 185/125, pulse (!) 101, temperature 98.2 F (36.8 C), temperature source Oral, resp.  rate 20, weight 92.3 kg, SpO2 100%.       No intake or output data in the 24 hours ending 01/24/24 2250 Filed Weights   01/24/24 2129  Weight: 92.3 kg   Physical Examination  GENERAL:asleep in no acute distress EYES: PEERLA. No scleral icterus. Extraocular muscles intact.  HEENT: Head atraumatic, normocephalic. Oropharynx and nasopharynx clear.  NECK:  No JVD, supple  LUNGS: Normal breath sounds bilaterally.  No use of accessory muscles of respiration.  CARDIOVASCULAR: S1, S2 normal. Irregular .No murmurs, rubs, or gallops.  ABDOMEN: Soft, NTND EXTREMITIES:No swelling or erythema. +Pulses distally. NEUROLOGIC: The patient is aphasic. No focal neurological deficit appreciated. Cranial nerves are intact.  SKIN: No obvious rash, lesion, or ulcer. Warm to touch Labs/imaging that I havepersonally reviewed  (right click and Reselect all SmartList Selections daily)   CT ANGIO HEAD NECK W WO CM W PERF (CODE STROKE) Result Date: 01/24/2024 CLINICAL DATA:  Neuro deficit, acute, stroke suspected EXAM: CT ANGIOGRAPHY HEAD AND NECK CT PERFUSION BRAIN TECHNIQUE: Multidetector CT imaging of the head and neck was performed using the standard protocol during bolus administration of intravenous contrast. Multiplanar CT image reconstructions and MIPs were obtained to evaluate the vascular anatomy. Carotid stenosis measurements (when applicable) are obtained utilizing NASCET criteria, using the distal internal carotid diameter as the denominator. Multiphase CT imaging of the brain was performed following IV bolus contrast injection. Subsequent parametric perfusion maps were calculated using RAPID software. RADIATION DOSE REDUCTION: This exam was performed according to the departmental dose-optimization program which includes automated exposure control, adjustment of the mA and/or kV according to patient size and/or use of iterative reconstruction technique.  CONTRAST:  OMNIPAQUE  IOHEXOL  350 MG/ML SOLN  COMPARISON:  CTA head neck 01/29/2023. FINDINGS: CTA NECK FINDINGS Limited evaluation due to poor contrast bolus timing. Within this limitation: Aortic arch: Great vessel origins are patent without significant stenosis Right carotid system: Atherosclerosis at carotid bifurcation with approximately 80% stenosis. Left carotid system: Carotid stent in place which traverses the carotid bifurcation and is patent. Vertebral arteries: Left dominant. Similar occlusion of the distal right vertebral artery with poor opacification proximally. Left vertebral artery is patent with similar moderate origin stenosis. Skeleton: No acute abnormality on limited assessment. Other neck: No acute abnormality on limited assessment. Upper chest: No acute abnormality on limited assessment. Review of the MIP images confirms the above findings CTA HEAD FINDINGS Limited evaluation due to poor contrast bolus timing. Within this limitation: Anterior circulation: Atherosclerosis of bilateral intracranial ICAs. The intracranial ICAs and proximal MCAs are patent. The proximal ACAs are patent bilaterally. Limited distal MCA and ACA evaluation due to poor contrast bolus timing and motion. Posterior circulation: Similar severe atherosclerotic narrowing of the left intradural vertebral artery proximally. Poorly opacified right intradural vertebral artery, chronic. The basilar artery and bilateral posterior arteries are patent. Similar small right P1 PCA with bilateral P2 PCA stenoses. Mid to distal PCAs are poorly evaluated due to contrast bolus timing. Venous sinuses: Not well evaluated. Review of the MIP images confirms the above findings CT Brain Perfusion Findings: ASPECTS: 10. CBF (<30%) Volume: 0mL Perfusion (Tmax>6.0s) volume: 37mL Mismatch Volume: 37mL Infarction Location:No core infarct identified. IMPRESSION: CTA: 1. Limited study without evidence of proximal large vessel occlusion. 2. 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 3. Interval stenting of the left carotid bifurcation/proximal ICA with patent stent. 4. Similar bilateral P2 PCA stenosis. 5. 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 6. Similar moderate left vertebral artery origin stenosis. CT perfusion: 1. Approximately 37 mL of reported penumbra, predominantly in the right cerebral hemisphere/ICA territory and potentially due to underlying right ICA stenosis and/or artifact. The degree of penumbra may be overestimated given likely artifactual mismatch in the inferior cerebellum. 2. No evidence of core infarct. An MRI could provide more sensitive evaluation for acute infarct if clinically warranted. Electronically Signed   By: Gilmore GORMAN Molt M.D.   On: 01/24/2024 21:42   CT HEAD CODE STROKE WO CONTRAST Result Date: 01/24/2024 CLINICAL DATA:  Code stroke.  Neuro deficit, acute, stroke suspected EXAM: CT HEAD WITHOUT CONTRAST TECHNIQUE: Contiguous axial images were obtained from the base of the skull through the vertex without intravenous contrast. RADIATION DOSE REDUCTION: This exam was performed according to the departmental dose-optimization program which includes automated exposure control, adjustment of the mA and/or kV according to patient size and/or use of iterative reconstruction technique. COMPARISON:  None Available. FINDINGS: Motion limited study.  Within this limitation Brain: No evidence of acute large vascular territory infarct acute hemorrhage mass lesion, midline shift or hydrocephalus. Advanced patchy and white matter hypodensities compatible with microvascular ischemic change. Vascular: No hyperdense vessel or unexpected calcification. Skull: Normal. Negative for fracture or focal lesion. Sinuses/Orbits: No acute finding. ASPECTS [facility] Stroke Program Early CT Score) Total score (0-10 with 10 being normal): 10. IMPRESSION: 1. Motion limited study without evidence of acute large vascular territory infarct or acute  hemorrhage. 2. Advanced microvascular ischemic disease. MRI could provide more sensitive evaluation for acute infarct if clinically warranted. Electronically Signed   By: Gilmore GORMAN Molt M.D.   On: 01/24/2024 21:13    Labs   CBC:  Recent Labs  Lab 01/24/24 2107  WBC 7.5  NEUTROABS 4.8  HGB 12.5  HCT 37.6  MCV 105.0*  PLT 206   Basic Metabolic Panel: Recent Labs  Lab 01/24/24 2107  NA 139  K 3.8  CL 98  CO2 28  GLUCOSE 148*  BUN 24*  CREATININE 0.97  CALCIUM  10.0   GFR: CrCl cannot be calculated (Unknown ideal weight.). Recent Labs  Lab 01/24/24 2107  WBC 7.5   Liver Function Tests: Recent Labs  Lab 01/24/24 2107  AST 33  ALT 22  ALKPHOS 85  BILITOT 0.6  PROT 7.5  ALBUMIN 4.0   No results for input(s): LIPASE, AMYLASE in the last 168 hours. No results for input(s): AMMONIA in the last 168 hours.  ABG No results found for: PHART, PCO2ART, PO2ART, HCO3, TCO2, ACIDBASEDEF, O2SAT   Coagulation Profile: Recent Labs  Lab 01/24/24 2107  INR 1.1   Cardiac Enzymes: No results for input(s): CKTOTAL, CKMB, CKMBINDEX, TROPONINI in the last 168 hours.  HbA1C: No results found for: HGBA1C  CBG: No results for input(s): GLUCAP in the last 168 hours.  Review of Systems:   Unable to be obtained secondary to the patient's altered mental status and aphasia   Past Medical History  She,  has no past medical history on file.   Surgical History      Social History     Family History   Her family history is not on file.   Allergies Not on File  Home Medications  Prior to Admission medications   Not on File  Scheduled Meds:   stroke: early stages of recovery book   Does not apply Once   Chlorhexidine  Gluconate Cloth  6 each Topical Daily   metoprolol  tartrate       pantoprazole  (PROTONIX ) IV  40 mg Intravenous QHS   sodium chloride  flush  3 mL Intravenous Once   Continuous Infusions:  sodium chloride  40 mL/hr at  01/25/24 0400   acetaminophen  400 mL/hr at 01/25/24 0400   clevidipine  Stopped (01/24/24 2254)   dexmedetomidine  (PRECEDEX ) IV infusion 0.3 mcg/kg/hr (01/25/24 0400)   PRN Meds:.acetaminophen  **OR** acetaminophen  (TYLENOL ) oral liquid 160 mg/5 mL **OR** acetaminophen , metoprolol  tartrate, morphine  injection, mouth rinse, senna-docusate  Active Hospital Problem list   See systems below  Assessment & Plan:  #Acute  CVA presenting with Aphasia and left side facial droop S/p TNK@ 21:51 pm for suspected Acute stroke Subsequent CTA Head and Neck with no LVO: no thrombectomy was indicated.  -vs and neuro checks/NIHSS per protocol for the first 24 hours  -No lines, catheters, antiplatelet or anticoagulants x24 hr after IV tPA unless required  -keep BP <185/100 with short-acting antihypertensives. Start Cleviprex  gtt  -TTE to r/o cardioembolic source if appropriate -check fasting Lipid Panel with Direct LDL in AM.  -check HgA1c, TSH  -Will repeat head CT/MRI Brain in 24 hours s/p thrombolytics per protocol. -SCDs for DVT prevention. -Failed swallow screen will keep NPO pending Speech eval  -PT/OT/, Speech consult -Aspiration & Bleeding precautions -Obtain STAT head CT for any new acute headache or new neurological deficits -Neurology Consult   #T2DM Hx of diet controlled DM.  Recent A1c  -Keep NPO pending speech as above -Hold long acting while NPO  -Q4 Blood glucose checks with SSI -ICU hypo/hyperglycemic protocol  #Acute Diarrhea -Unclear etiology -Obtain CDiff and GI Panel -IVFs   #HTN #HLD #Chronic Atrial Fibrillation not anticoagulated since 02/2023 post Carotid stent placement PMHx: CAD s/p LHC 01/11/2021,  Inferior wall STEMI, Diastolic dysfunction,Bilateral Carotid stenosis s/p stenting -Hold Torsemide -Hold Plavix  and Aspirin -Resume Atorvastin once able to tolerate po -Start Cleviprex  gtt for BP control -Obtain repeat Echo  #Anxiety and Depression -Hold Alprazolam  and Citolopram while on Precedex  for agitation  #Altered Mental Status -Start precedex  for agitation and restlessness    Best practice:  Diet:  NPO Pain/Anxiety/Delirium protocol (if indicated): No VAP protocol (if indicated): Not indicated DVT prophylaxis: Contraindicated GI prophylaxis: PPI Glucose control:  SSI No Central venous access:  N/A Arterial line:  N/A Foley:  N/A Mobility:  bed rest  PT consulted: Yes Last date of multidisciplinary goals of care discussion [updated daughters at the bedside] Code Status:  full code Disposition: ICU   = Goals of Care = Code Status Order:FULL   Wishes to pursue full aggressive treatment and intervention options, including CPR and intubation, but goals of care will be addressed on going with family if that should become necessary.   Critical care time: 45 minutes        Almarie Nose DNP, CCRN, FNP-C, AGACNP-BC Acute Care & Family Nurse Practitioner [facility] PCCM on call pager [phone]

## 2024-01-24 NOTE — ED Notes (Signed)
 Pt is now no longer speaking, she will follow commands but makes no attempts at talking.

## 2024-01-25 ENCOUNTER — Inpatient Hospital Stay

## 2024-01-25 ENCOUNTER — Encounter: Payer: Self-pay | Admitting: Internal Medicine

## 2024-01-25 DIAGNOSIS — I639 Cerebral infarction, unspecified: Secondary | ICD-10-CM

## 2024-01-25 DIAGNOSIS — I7090 Unspecified atherosclerosis: Secondary | ICD-10-CM | POA: Diagnosis not present

## 2024-01-25 DIAGNOSIS — I6523 Occlusion and stenosis of bilateral carotid arteries: Secondary | ICD-10-CM | POA: Diagnosis not present

## 2024-01-25 DIAGNOSIS — G928 Other toxic encephalopathy: Secondary | ICD-10-CM

## 2024-01-25 LAB — LIPID PANEL
Cholesterol: 134 mg/dL (ref 0–200)
HDL: 60 mg/dL (ref >40)
LDL Cholesterol: 61 mg/dL (ref 0–99)
Total CHOL/HDL Ratio: 2.2 ratio
Triglycerides: 67 mg/dL (ref <150)
VLDL: 13 mg/dL (ref 0–40)

## 2024-01-25 LAB — PROCALCITONIN: Procalcitonin: <0.1 ng/mL

## 2024-01-25 LAB — HEMOGLOBIN A1C
Hgb A1c MFr Bld: 5.5 % (ref 4.8–5.6)
Mean Plasma Glucose: 111.15 mg/dL

## 2024-01-25 LAB — URINALYSIS, COMPLETE (UACMP) WITH MICROSCOPIC
Bilirubin Urine: NEGATIVE
Glucose, UA: NEGATIVE mg/dL
Ketones, ur: NEGATIVE mg/dL
Nitrite: NEGATIVE
Protein, ur: 30 mg/dL — AB
Specific Gravity, Urine: 1.034 — ABNORMAL HIGH (ref 1.005–1.030)
pH: 5 (ref 5.0–8.0)

## 2024-01-25 LAB — TSH: TSH: 2.671 u[IU]/mL (ref 0.350–4.500)

## 2024-01-25 LAB — GLUCOSE, CAPILLARY: Glucose-Capillary: 208 mg/dL — ABNORMAL HIGH (ref 70–99)

## 2024-01-25 LAB — MRSA NEXT GEN BY PCR, NASAL: MRSA by PCR Next Gen: NOT DETECTED

## 2024-01-25 LAB — T4, FREE: Free T4: 0.78 ng/dL (ref 0.61–1.12)

## 2024-01-25 MED ORDER — MORPHINE SULFATE (PF) 2 MG/ML IV SOLN
2.0000 mg | INTRAVENOUS | Status: DC | PRN
  Administered 2024-01-25: 2 mg via INTRAVENOUS
  Filled 2024-01-25: qty 1

## 2024-01-25 MED ORDER — FENTANYL CITRATE PF 50 MCG/ML IJ SOSY
25.0000 ug | PREFILLED_SYRINGE | Freq: Once | INTRAMUSCULAR | Status: AC
  Administered 2024-01-25: 25 ug via INTRAVENOUS
  Filled 2024-01-25: qty 2

## 2024-01-25 MED ORDER — CHLORHEXIDINE GLUCONATE CLOTH 2 % EX PADS
6.0000 | MEDICATED_PAD | Freq: Every day | CUTANEOUS | Status: DC
  Administered 2024-01-25 – 2024-01-27 (×3): 6 via TOPICAL

## 2024-01-25 MED ORDER — ORAL CARE MOUTH RINSE: 15.0000 mL | OROMUCOSAL | Status: DC | PRN

## 2024-01-25 MED ORDER — SODIUM CHLORIDE 0.9 % IV BOLUS
500.0000 mL | Freq: Once | INTRAVENOUS | Status: AC
  Administered 2024-01-25: 500 mL via INTRAVENOUS

## 2024-01-25 MED ORDER — ACETAMINOPHEN 10 MG/ML IV SOLN
1000.0000 mg | Freq: Four times a day (QID) | INTRAVENOUS | Status: AC
  Administered 2024-01-25 (×3): 1000 mg via INTRAVENOUS
  Filled 2024-01-25 (×3): qty 100

## 2024-01-25 MED ORDER — ACETAMINOPHEN 10 MG/ML IV SOLN
1000.0000 mg | Freq: Four times a day (QID) | INTRAVENOUS | Status: DC
  Filled 2024-01-25: qty 100

## 2024-01-25 NOTE — Plan of Care (Signed)
  Problem: Education: Goal: Knowledge of secondary prevention will improve (MUST DOCUMENT ALL) Outcome: Progressing   Problem: Education: Goal: Knowledge of disease or condition will improve Outcome: Progressing   Problem: Education: Goal: Knowledge of disease or condition will improve Outcome: Progressing Goal: Knowledge of secondary prevention will improve (MUST DOCUMENT ALL) Outcome: Progressing Goal: Knowledge of patient specific risk factors will improve (DELETE if not current risk factor) Outcome: Progressing   Problem: Ischemic Stroke/TIA Tissue Perfusion: Goal: Complications of ischemic stroke/TIA will be minimized Outcome: Progressing   Problem: Coping: Goal: Will verbalize positive feelings about self Outcome: Progressing Goal: Will identify appropriate support needs Outcome: Progressing

## 2024-01-25 NOTE — Progress Notes (Signed)
 PT Cancellation Note  Patient Details Name: Brooke Davis MRN: [id number] DOB: 03-16-1941   Cancelled Treatment:    Reason Eval/Treat Not Completed: Active bedrest order;Patient not medically ready. Consult received and chart reviewed.  Patient admitted for CVA work up with TNK administration on 01/24/24 at 2150. Per guidelines, to be on strict bedrest x24 hours post infusion and follow up imaging required to be completed prior to initiation of therapy.  Will continue to follow and initiate as medically appropriate.   Doyal Shams PT, DPT 9:34 AM,01/25/24

## 2024-01-25 NOTE — Progress Notes (Addendum)
 eLink Physician-Brief Progress Note Patient Name: Brooke Davis DOB: 1940/07/24 MRN: [id number]   Date of Service  01/25/2024  HPI/Events of Note  eICU Brief new admit note: 83 year old female with history of hypertension, diabetes, multiple prior strokes, A-fib not on anticoagulation presenting to the emergency department for evaluation of aphasia as a code stroke. S/p tnk, no LVO.  Seen by neurologist.    Data: Reviewed CMP, cbc is ok. Normal.  EKG a fib, qtc < 400 TSH nrmal. Pro cal normal  Camera: On nasal o2, VS stable. Sinus tachy 116. Sats 100%. SBP 167. On precedex  at 0.7   eICU Interventions  Follow neuro recommendations. Keep SBP under 180 and DBP < 105. MRI in AM. Asp and sz precautions CBG goals < 180. 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 Consider OBGYN opinion. Not in sepsis . SCD, aspiration, sz precautions ordered.      Intervention Category Major Interventions: Change in mental status - evaluation and management;Other: Evaluation Type: New Patient Evaluation  Brooke Davis 01/25/2024, 2:14 AM

## 2024-01-25 NOTE — Progress Notes (Signed)
 NEUROLOGY CONSULT FOLLOW UP NOTE   Date of service: January 25, 2024 Patient Name: Brooke Davis MRN:  [id number] DOB:  [birth year]  Interval Hx/subjective  Seen and examined Spoken with daughters in the waiting room  Received TNK for aphasia and slight right facial drooping.  Past history of A-fib-was on Eliquis up until August 2024 when it was discontinued-unclear reason. Has a history of bilateral carotid stenosis status post stenting.  Currently on Plavix .  Family reports that the grandson who lives with her found her altered and unable to talk when she tried to call him.  Family reports that she has been unwell over the last few days with what seemed like a viral infection.  She has also been getting more agitated and unwell appearing over the last 1 month. Last known well documented in the teleconsult is 8:30 PM yesterday but on further digging, last known well is difficult to ascertain as none of the daughters was with her and the grandson, got a call around 8:00 PM from the patient with her not being able to talk.  Last seen well probably was around 730 and not 8:30 PM but nonetheless within the window.  Overnight she also spiked fevers up to 102 Fahrenheit  Vitals   Vitals:   01/25/24 0600 01/25/24 0630 01/25/24 0700 01/25/24 0800  BP: (!) 167/23 (!) 169/77 (!) 141/60 (!) 135/51  Pulse: (!) 117 (!) 125 76 65  Resp: (!) 21 13 15 16   Temp: (!) 102.2 F (39 C) (!) 102 F (38.9 C)  99.9 F (37.7 C)  TempSrc:    Axillary  SpO2: 100% 97% 97% 99%  Weight:      Height:         Body mass index is 30.93 kg/m.  Physical Exam  General: Drowsy, opens eyes to voice. HEENT: Normocephalic dermatic Lungs:Clear Cardiovascular: Regular rhythm Abdomen nondistended nontender Neurological exam Drowsy appearing, opens eyes to voice. Is able to tell me her first name only. Unable to tell me her age correctly Garbled speech which is incomprehensible Unable to name objects Follows  simple commands Cranial nerves II to XII grossly appear intact Motor examination with generalized weakness with no focality with drift in all 4 extremities Sensation intact light touch Coordination difficult to assess given her mentation  Medications  Current Facility-Administered Medications:     stroke: early stages of recovery book, , Does not apply, Once, Ouma, Almarie Bake, NP   0.9 %  sodium chloride  infusion, , Intravenous, Continuous, Ouma, Almarie Bake, NP, Last Rate: 40 mL/hr at 01/25/24 0400, Infusion Verify at 01/25/24 0400   acetaminophen  (OFIRMEV ) IV 1,000 mg, 1,000 mg, Intravenous, Q6H, Belue, Rankin RAMAN, RPH, Last Rate: 400 mL/hr at 01/25/24 0400, Infusion Verify at 01/25/24 0400   acetaminophen  (TYLENOL ) tablet 650 mg, 650 mg, Oral, Q4H PRN **OR** acetaminophen  (TYLENOL ) 160 MG/5ML solution 650 mg, 650 mg, Per Tube, Q4H PRN **OR** acetaminophen  (TYLENOL ) suppository 650 mg, 650 mg, Rectal, Q4H PRN, Ouma, Almarie Bake, NP   Chlorhexidine  Gluconate Cloth 2 % PADS 6 each, 6 each, Topical, Daily, Kasa, Kurian, MD   clevidipine  (CLEVIPREX ) infusion 0.5 mg/mL, 0-21 mg/hr, Intravenous, Continuous, Ouma, Almarie Bake, NP, Held at 01/24/24 2254   dexmedetomidine  (PRECEDEX ) 400 MCG/100ML (4 mcg/mL) infusion, 0-1.2 mcg/kg/hr, Intravenous, Titrated, Ouma, Almarie Bake, NP, Last Rate: 6.92 mL/hr at 01/25/24 0400, 0.3 mcg/kg/hr at 01/25/24 0400   metoprolol  tartrate (LOPRESSOR ) 5 MG/5ML injection, , , ,    morphine  (PF) 2 MG/ML injection 2 mg, 2  mg, Intravenous, Q4H PRN, Ouma, Elizabeth Achieng, NP, 2 mg at 01/25/24 0035   Oral care mouth rinse, 15 mL, Mouth Rinse, PRN, Ouma, Almarie Bake, NP   pantoprazole  (PROTONIX ) injection 40 mg, 40 mg, Intravenous, QHS, Ouma, Almarie Bake, NP   senna-docusate (Senokot-S) tablet 1 tablet, 1 tablet, Oral, QHS PRN, Ouma, Almarie Bake, NP   sodium chloride  flush (NS) 0.9 % injection 3 mL, 3 mL, Intravenous, Once, Levander Slate, MD  Labs and Diagnostic Imaging   CBC:  Recent Labs  Lab 01/24/24 2107  WBC 7.5  NEUTROABS 4.8  HGB 12.5  HCT 37.6  MCV 105.0*  PLT 206    Basic Metabolic Panel:  Lab Results  Component Value Date   NA 139 01/24/2024   K 3.8 01/24/2024   CO2 28 01/24/2024   GLUCOSE 148 (H) 01/24/2024   BUN 24 (H) 01/24/2024   CREATININE 0.97 01/24/2024   CALCIUM  10.0 01/24/2024   GFRNONAA 59 (L) 01/24/2024   Lipid Panel:  Lab Results  Component Value Date   LDLCALC 61 01/25/2024   Alcohol Level     Component Value Date/Time   [facility] <15 01/24/2024 2107   INR  Lab Results  Component Value Date   INR 1.1 01/24/2024   APTT  Lab Results  Component Value Date   APTT 34 01/24/2024   CT Head without contrast(Personally reviewed): Motion limited-aspects 10.  No bleed.  CT angio Head and Neck with contrast(Personally reviewed): No ELVO.  Unchanged right ICA origin 80% stenosis from previous scan.  Interval stenting of left carotid bifurcation/proximal ICA with patent stent.  Bilateral P2 PCA stenosis.  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 CT perfusion study with 37 cc penumbra in the right cerebral hemisphere/ICA territory potentially due to underlying right ACA stenosis or artifact.  Degree of penumbra may be overestimated given likely artifactual mismatch in the inferior cerebellum.  Assessment   Brooke Davis is a 83 y.o. female history of bilateral carotid stenosis status post stenting of the left carotid last year, history of atrial fibrillation not on anticoagulation, heart failure, type II DM, prior stroke, anxiety, depression presented as a code stroke with acute onset of aphasia and left facial droop. Evaluated by telestroke-TNK given by telestroke. Remains somewhat encephalopathic and aphasic at this time. Fevers overnight-unclear etiology  Impression: Acute ischemic stroke status post thrombolytic administration-etiology likely large  vessel atherosclerosis versus cardioembolic Toxic metabolic encephalopathy Prior history of strokes Right carotid stenosis Left carotid stenosis status post stent  Recommendations  Continue frequent neurochecks and vitals per the post TNK protocol in the ICU Appreciate ICU care of the patient Telemetry No antiplatelets or an coagulants for 24 hours from TNK 24-hour imaging around 9 PM Mark today-MRI brain without contrast ordered Infectious workup for fevers-appreciate ICU with medical management 2D echo A1c Lipid panel reveals LDL 61.  This is at goal.  She is on home atorvastatin  80 mg-continue that for now. PT OT Speech therapy Management of toxic metabolic derangements per primary team  Plan discussed with Dr. Isaiah PCCM. Plan also discussed with daughters  Will follow  ______________________________________________________________________   Signed, Eligio Lav, MD [facility]  CRITICAL CARE ATTESTATION Performed by: Eligio Lav, MD Total critical care time: 50 minutes Critical care time was exclusive of separately billable procedures and treating other patients and/or supervising APPs/Residents/Students Critical care was necessary to treat or prevent imminent or life-threatening deterioration. This patient is critically ill and at significant risk for neurological worsening and/or death and  care requires constant monitoring. Critical care was time spent personally by me on the following activities: development of treatment plan with patient and/or surrogate as well as nursing, discussions with consultants, evaluation of patient's response to treatment, examination of patient, obtaining history from patient or surrogate, ordering and performing treatments and interventions, ordering and review of laboratory studies, ordering and review of radiographic studies, pulse oximetry, re-evaluation of patient's condition, participation in multidisciplinary rounds and  medical decision making of high complexity in the care of this patient.

## 2024-01-25 NOTE — Progress Notes (Signed)
 Assisted patient with bedside swallow eval. Patient was able to swallow a spoonfull of water, drink from a straw, and eat pudding with no difficulty. Patient did not choke or cough after eating/drinking. Contacted attending (Dr. Isaiah) for diet order.

## 2024-01-25 NOTE — Progress Notes (Signed)
 SLP Cancellation Note  Patient Details Name: Brooke Davis MRN: [id number] DOB: [birth year]   Cancelled treatment:       Reason Eval/Treat Not Completed: Medical issues which prohibited therapy;Patient not medically ready (Chart reviewed. Spoke with RN. Per RN, pt sedated and not appropriate for SLP services at this time. Will continue efforts.)  [name], M.S., CCC-SLP Speech-Language Pathologist [facility] [phone] [place])  Delon CHRISTELLA Bangs 01/25/2024, 8:55 AM

## 2024-01-25 NOTE — Progress Notes (Signed)
 OT Cancellation Note  Patient Details Name: Brooke Davis MRN: [id number] DOB: 15-Nov-1940   Cancelled Treatment:    Reason Eval/Treat Not Completed: Active bedrest order;Medical issues which prohibited therapy. Consult received and chart reviewed.  Patient admitted for CVA work up with TNK administration on 01/24/24 at 2150. Per guidelines, to be on strict bedrest x24 hours post infusion and follow up imaging required to be completed prior to initiation of therapy.  Will continue to follow and initiate as medically appropriate.   Elston Slot, M.S. OTR/L  01/25/24, 8:53 AM  ascom [phone]

## 2024-01-25 NOTE — Progress Notes (Signed)
 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   ED Course: Initial vital signs showed HR of 108  beats/minute, BP 168/94 mm Hg, the RR 30 breaths/minute, and the oxygen saturation 100 % on RA and a temperature of 98.25F (36.8C).  Code stroke initiated and patient evaluated by teleneurologist.  Stat CT head CTA angio obtained as below. CXR> negative CTH> CTA Head and Neck> No acute intracranial abnormality Pertinent Labs/Diagnostics Findings: Unremarkable CMP and WBC LKNW:1930 Initial NIHSS:5 Patient deemed candidate for thrombolytics and TNK administered 21:51. PCCM consulted for admission post thrombolytics.  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 followed by infection of the rt hip leading to I/D and capsule change on 03/21/23, culture positive for Serratia, Chronic Atrial Fibrillation off Apixaban since 02/25/2023, T2DM, CVA, CAD s/p LHC 01/11/2021, Inferior wall STEMI, Anxiety and Depression   Significant Hospital Events   7/7: Admitted to the ICU for acute CVA s/p TNK @2151  7/8 NIH scale improving  Consults:  Neurology  Procedures:  None  Interim History / Subjective:    Alert and awake  Micro Data:  None  Antimicrobials:  None  OBJECTIVE  Blood pressure (!) 119/43, pulse 72, temperature 99.2 F (37.3  C), temperature source Axillary, resp. rate 16, height 5' 5 (1.651 m), weight 84.3 kg, SpO2 98%.        Intake/Output Summary (Last 24 hours) at 01/25/2024 1537 Last data filed at 01/25/2024 1427 Gross per 24 hour  Intake 276.02 ml  Output 550 ml  Net -273.98 ml   Filed Weights   01/24/24 2129 01/25/24 0139  Weight: 92.3 kg 84.3 kg   Physical Examination  GENERAL: no acute distress EYES: PEERLA. No scleral icterus. Extraocular muscles intact.  HEENT: Head atraumatic, normocephalic. Oropharynx and nasopharynx clear.  LUNGS: Normal breath sounds bilaterally.  No use of accessory muscles of respiration.  CARDIOVASCULAR: S1, S2 normal. Irregular .No murmurs, rubs, or gallops.  NEUROLOGIC: The patient is aphasic. No focal neurological deficit appreciated. Cranial nerves are intact.   Labs/imaging that I havepersonally reviewed  (right click and Reselect all SmartList Selections daily)   DG Chest Port 1 View Result Date: 01/25/2024 CLINICAL DATA:  Pneumonia.  Atrial fibrillation. EXAM: PORTABLE CHEST 1 VIEW COMPARISON:  03/18/2023 FINDINGS: Cardiomegaly remains stable. Small sub pulmonic left pleural effusion is seen with mild atelectasis or infiltrate in left lung base. Right lung is grossly clear. IMPRESSION: Small sub pulmonic left pleural effusion, with mild left basilar atelectasis versus infiltrate. Recommend continued chest radiographic follow-up to confirm resolution. Stable cardiomegaly. Electronically Signed   By: Norleen DELENA Kil M.D.   On: 01/25/2024 10:13   CT ABDOMEN PELVIS WO CONTRAST Result Date: 01/25/2024 CLINICAL DATA:  Sepsis EXAM: CT ABDOMEN AND PELVIS WITHOUT CONTRAST TECHNIQUE: Multidetector CT imaging of the abdomen and pelvis was performed following the standard protocol without IV contrast. RADIATION DOSE REDUCTION: This  exam was performed according to the departmental dose-optimization program which includes automated exposure control, adjustment of the mA and/or kV  according to patient size and/or use of iterative reconstruction technique. COMPARISON:  None Available. FINDINGS: Lower chest: Bibasilar atelectasis. Mild cardiomegaly and coronary artery disease. Hepatobiliary: 2 cm gallstone layering within the gallbladder. No evidence of acute cholecystitis or biliary ductal dilatation. No focal hepatic abnormality. Pancreas: No focal abnormality or ductal dilatation. Spleen: No focal abnormality.  Normal size. Adrenals/Urinary Tract: Adrenal glands are normal. Contrast fills the collecting systems, ureters and bladder from earlier CT angio head. No hydronephrosis. No suspicious renal abnormality. Urinary bladder unremarkable. Stomach/Bowel: Normal appendix. Stomach, large and small bowel grossly unremarkable. Vascular/Lymphatic: No evidence of aneurysm or adenopathy. Aortic atherosclerosis. Reproductive: Prior hysterectomy. No adnexal masses. 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 A part of the fistula may be seen along the inferior bladder on sagittal image 68, series 6. Other: No free fluid or free air. Musculoskeletal: No acute bony abnormality. Prior right hip replacement. IMPRESSION: Contrast seen in the vagina. 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 Contrast fills the urinary bladder from earlier CTA head. Cholelithiasis.  No CT evidence of acute cholecystitis. Cardiomegaly, coronary artery disease.  Aortic atherosclerosis. Electronically Signed   By: Franky Crease M.D.   On: 01/25/2024 02:15   CT ANGIO HEAD NECK W WO CM W PERF (CODE STROKE) Result Date: 01/24/2024 CLINICAL DATA:  Neuro deficit, acute, stroke suspected EXAM: CT ANGIOGRAPHY HEAD AND NECK CT PERFUSION BRAIN TECHNIQUE: Multidetector CT imaging of the head and neck was performed using the standard protocol during bolus administration of intravenous contrast. Multiplanar CT image reconstructions and MIPs were obtained to evaluate the vascular  anatomy. Carotid stenosis measurements (when applicable) are obtained utilizing NASCET criteria, using the distal internal carotid diameter as the denominator. Multiphase CT imaging of the brain was performed following IV bolus contrast injection. Subsequent parametric perfusion maps were calculated using RAPID software. RADIATION DOSE REDUCTION: This exam was performed according to the departmental dose-optimization program which includes automated exposure control, adjustment of the mA and/or kV according to patient size and/or use of iterative reconstruction technique. CONTRAST:  OMNIPAQUE  IOHEXOL  350 MG/ML SOLN COMPARISON:  CTA head neck 01/29/2023. FINDINGS: CTA NECK FINDINGS Limited evaluation due to poor contrast bolus timing. Within this limitation: Aortic arch: Great vessel origins are patent without significant stenosis Right carotid system: Atherosclerosis at carotid bifurcation with approximately 80% stenosis. Left carotid system: Carotid stent in place which traverses the carotid bifurcation and is patent. Vertebral arteries: Left dominant. Similar occlusion of the distal right vertebral artery with poor opacification proximally. Left vertebral artery is patent with similar moderate origin stenosis. Skeleton: No acute abnormality on limited assessment. Other neck: No acute abnormality on limited assessment. Upper chest: No acute abnormality on limited assessment. Review of the MIP images confirms the above findings CTA HEAD FINDINGS Limited evaluation due to poor contrast bolus timing. Within this limitation: Anterior circulation: Atherosclerosis of bilateral intracranial ICAs. The intracranial ICAs and proximal MCAs are patent. The proximal ACAs are patent bilaterally. Limited distal MCA and ACA evaluation due to poor contrast bolus timing and motion. Posterior circulation: Similar severe atherosclerotic narrowing of the left intradural vertebral artery proximally. Poorly opacified right  intradural vertebral artery, chronic. The basilar artery and bilateral posterior arteries are patent. Similar small right P1 PCA with bilateral P2 PCA stenoses. Mid to distal PCAs are poorly evaluated due to contrast bolus timing.  Venous sinuses: Not well evaluated. Review of the MIP images confirms the above findings CT Brain Perfusion Findings: ASPECTS: 10. CBF (<30%) Volume: 0mL Perfusion (Tmax>6.0s) volume: 37mL Mismatch Volume: 37mL Infarction Location:No core infarct identified. IMPRESSION: CTA: 1. Limited study without evidence of proximal large vessel occlusion. 2. 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 3. Interval stenting of the left carotid bifurcation/proximal ICA with patent stent. 4. Similar bilateral P2 PCA stenosis. 5. 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 6. Similar moderate left vertebral artery origin stenosis. CT perfusion: 1. Approximately 37 mL of reported penumbra, predominantly in the right cerebral hemisphere/ICA territory and potentially due to underlying right ICA stenosis and/or artifact. The degree of penumbra may be overestimated given likely artifactual mismatch in the inferior cerebellum. 2. No evidence of core infarct. An MRI could provide more sensitive evaluation for acute infarct if clinically warranted. Electronically Signed   By: Gilmore GORMAN Molt M.D.   On: 01/24/2024 21:42   CT HEAD CODE STROKE WO CONTRAST Result Date: 01/24/2024 CLINICAL DATA:  Code stroke.  Neuro deficit, acute, stroke suspected EXAM: CT HEAD WITHOUT CONTRAST TECHNIQUE: Contiguous axial images were obtained from the base of the skull through the vertex without intravenous contrast. RADIATION DOSE REDUCTION: This exam was performed according to the departmental dose-optimization program which includes automated exposure control, adjustment of the mA and/or kV according to patient size and/or use of iterative reconstruction technique. COMPARISON:   None Available. FINDINGS: Motion limited study.  Within this limitation Brain: No evidence of acute large vascular territory infarct acute hemorrhage mass lesion, midline shift or hydrocephalus. Advanced patchy and white matter hypodensities compatible with microvascular ischemic change. Vascular: No hyperdense vessel or unexpected calcification. Skull: Normal. Negative for fracture or focal lesion. Sinuses/Orbits: No acute finding. ASPECTS [facility] Stroke Program Early CT Score) Total score (0-10 with 10 being normal): 10. IMPRESSION: 1. Motion limited study without evidence of acute large vascular territory infarct or acute hemorrhage. 2. Advanced microvascular ischemic disease. MRI could provide more sensitive evaluation for acute infarct if clinically warranted. Electronically Signed   By: Gilmore GORMAN Molt M.D.   On: 01/24/2024 21:13    Labs   CBC: Recent Labs  Lab 01/24/24 2107  WBC 7.5  NEUTROABS 4.8  HGB 12.5  HCT 37.6  MCV 105.0*  PLT 206   Basic Metabolic Panel: Recent Labs  Lab 01/24/24 2107  NA 139  K 3.8  CL 98  CO2 28  GLUCOSE 148*  BUN 24*  CREATININE 0.97  CALCIUM  10.0   GFR: Estimated Creatinine Clearance: 49.6 mL/min (by C-G formula based on SCr of 0.97 mg/dL). Recent Labs  Lab 01/24/24 2107  PROCALCITON <0.10  WBC 7.5   Liver Function Tests: Recent Labs  Lab 01/24/24 2107  AST 33  ALT 22  ALKPHOS 85  BILITOT 0.6  PROT 7.5  ALBUMIN 4.0   No results for input(s): LIPASE, AMYLASE in the last 168 hours. No results for input(s): AMMONIA in the last 168 hours.  ABG No results found for: PHART, PCO2ART, PO2ART, HCO3, TCO2, ACIDBASEDEF, O2SAT   Coagulation Profile: Recent Labs  Lab 01/24/24 2107  INR 1.1   Cardiac Enzymes: No results for input(s): CKTOTAL, CKMB, CKMBINDEX, TROPONINI in the last 168 hours.  HbA1C: Hgb A1c MFr Bld  Date/Time Value Ref Range Status  01/24/2024 09:07 PM 5.5 4.8 - 5.6 % Final     Comment:    (NOTE) Diagnosis of Diabetes The following HbA1c ranges recommended by the American  Diabetes Association (ADA) may be used as an aid in the diagnosis of diabetes mellitus.  Hemoglobin             Suggested A1C NGSP%              Diagnosis  <5.7                   Non Diabetic  5.7-6.4                Pre-Diabetic  >6.4                   Diabetic  <7.0                   Glycemic control for                       adults with diabetes.      CBG: Recent Labs  Lab 01/25/24 0121  GLUCAP 208*   Allergies No Known Allergies  Home Medications  Prior to Admission medications   Not on File  Scheduled Meds:  Chlorhexidine  Gluconate Cloth  6 each Topical Daily   pantoprazole  (PROTONIX ) IV  40 mg Intravenous QHS   sodium chloride  flush  3 mL Intravenous Once   Continuous Infusions:  sodium chloride  40 mL/hr at 01/25/24 0400   acetaminophen  1,000 mg (01/25/24 1221)   PRN Meds:.acetaminophen  **OR** acetaminophen  (TYLENOL ) oral liquid 160 mg/5 mL **OR** acetaminophen , morphine  injection, mouth rinse, senna-docusate  Active Hospital Problem list   See systems below  Assessment & Plan:  Acute  CVA presenting with Aphasia and left side facial droop S/p TNK@ 21:51 pm for suspected Acute stroke Subsequent CTA Head and Neck with no LVO: no thrombectomy was indicated.  -vs and neuro checks/NIHSS per protocol for the first 24 hours  -No lines, catheters, antiplatelet or anticoagulants x24 hr after IV tPA unless required  -keep BP <185/100 with short-acting antihypertensives. Start Cleviprex  gtt  -TTE to r/o cardioembolic source if appropriate -check fasting Lipid Panel with Direct LDL in AM.  -check HgA1c, TSH  -Will repeat head CT/MRI Brain in 24 hours s/p thrombolytics per protocol. -SCDs for DVT prevention. -Failed swallow screen will keep NPO pending Speech eval  -PT/OT/, Speech consult -Aspiration & Bleeding precautions -Obtain STAT head CT for any new acute  headache or new neurological deficits -Neurology Consult  Chronic Atrial Fibrillation not anticoagulated since 02/2023 post Carotid stent placement PMHx: CAD s/p LHC 01/11/2021, Inferior wall STEMI, Diastolic dysfunction,Bilateral Carotid stenosis s/p stenting -Hold Torsemide -Hold Plavix  and Aspirin -Resume Atorvastin once able to tolerate po -Start Cleviprex  gtt for BP control -Obtain repeat Echo     Best practice:  Diet:  NPO Pain/Anxiety/Delirium protocol (if indicated): No VAP protocol (if indicated): Not indicated DVT prophylaxis: Contraindicated GI prophylaxis: PPI Glucose control:  SSI No Central venous access:  N/A Arterial line:  N/A Foley:  N/A Mobility:  bed rest  PT consulted: Yes Last date of multidisciplinary goals of care discussion [updated daughters at the bedside] Code Status:  full code Disposition: ICU    DVT/GI PRX  assessed I Assessed the need for Labs I Assessed the need for Foley I Assessed the need for Central Venous Line Family Discussion when available I Assessed the need for Mobilization I made an Assessment of medications to be adjusted accordingly Safety Risk assessment completed  CASE DISCUSSED IN MULTIDISCIPLINARY ROUNDS WITH ICU TEAM   POOR PROGNOSIS RECOMMEND DNR/DNI STATUS PALLIATIVE  CARE TEAM CONSULTED  Gerhard Rappaport Alm Cellar, M.D.  [facility]  Medical Director [facility] Medical Director [facility] Cardio-Pulmonary Department

## 2024-01-25 NOTE — Progress Notes (Signed)
 SLP Cancellation Note  Patient Details Name: Brooke Davis MRN: [id number] DOB: [birth year]   Cancelled treatment:       Reason Eval/Treat Not Completed: Patient's level of consciousness (Per chart review, pt continues to sub-optimial LOA for participation in SLP evaluation. Palliative Care Consult pending.  Will continue efforts.)  [name], M.S., CCC-SLP Speech-Language Pathologist [facility] [phone] [place])  Delon CHRISTELLA Bangs 01/25/2024, 1:09 PM

## 2024-01-26 ENCOUNTER — Inpatient Hospital Stay (HOSPITAL_COMMUNITY)
Admit: 2024-01-26 | Discharge: 2024-01-26 | Disposition: A | Discharge status: Home / Self Care | Attending: Nurse Practitioner | Admitting: Nurse Practitioner

## 2024-01-26 DIAGNOSIS — I6389 Other cerebral infarction: Secondary | ICD-10-CM

## 2024-01-26 DIAGNOSIS — N390 Urinary tract infection, site not specified: Secondary | ICD-10-CM | POA: Diagnosis not present

## 2024-01-26 DIAGNOSIS — I7090 Unspecified atherosclerosis: Secondary | ICD-10-CM | POA: Diagnosis not present

## 2024-01-26 DIAGNOSIS — I639 Cerebral infarction, unspecified: Secondary | ICD-10-CM | POA: Diagnosis not present

## 2024-01-26 DIAGNOSIS — G928 Other toxic encephalopathy: Secondary | ICD-10-CM | POA: Diagnosis not present

## 2024-01-26 LAB — BASIC METABOLIC PANEL WITH GFR
Anion gap: 13 (ref 5–15)
BUN: 24 mg/dL — ABNORMAL HIGH (ref 8–23)
CO2: 27 mmol/L (ref 22–32)
Calcium: 10.1 mg/dL (ref 8.9–10.3)
Chloride: 101 mmol/L (ref 98–111)
Creatinine, Ser: 1.17 mg/dL — ABNORMAL HIGH (ref 0.44–1.00)
GFR, Estimated: 47 mL/min — ABNORMAL LOW
Glucose, Bld: 120 mg/dL — ABNORMAL HIGH (ref 70–99)
Potassium: 4.3 mmol/L (ref 3.5–5.1)
Sodium: 141 mmol/L (ref 135–145)

## 2024-01-26 LAB — MAGNESIUM: Magnesium: 1.9 mg/dL (ref 1.7–2.4)

## 2024-01-26 LAB — CBC
HCT: 35.7 % — ABNORMAL LOW (ref 36.0–46.0)
Hemoglobin: 11.8 g/dL — ABNORMAL LOW (ref 12.0–15.0)
MCH: 35 pg — ABNORMAL HIGH (ref 26.0–34.0)
MCHC: 33.1 g/dL (ref 30.0–36.0)
MCV: 105.9 fL — ABNORMAL HIGH (ref 80.0–100.0)
Platelets: 168 K/uL (ref 150–400)
RBC: 3.37 MIL/uL — ABNORMAL LOW (ref 3.87–5.11)
RDW: 13.8 % (ref 11.5–15.5)
WBC: 9.4 K/uL (ref 4.0–10.5)
nRBC: 0 % (ref 0.0–0.2)

## 2024-01-26 LAB — ECHOCARDIOGRAM COMPLETE
AR max vel: 1.41 cm2
AV Area VTI: 1.53 cm2
AV Area mean vel: 1.47 cm2
AV Mean grad: 11.3 mmHg
AV Peak grad: 19.8 mmHg
Ao pk vel: 2.22 m/s
Area-P 1/2: 3.85 cm2
Height: 65 in
S' Lateral: 2.9 cm
Weight: 2973.56 [oz_av]

## 2024-01-26 LAB — PHOSPHORUS: Phosphorus: 3.3 mg/dL (ref 2.5–4.6)

## 2024-01-26 MED ORDER — CITALOPRAM HYDROBROMIDE 20 MG PO TABS
20.0000 mg | ORAL_TABLET | Freq: Every day | ORAL | Status: DC
  Administered 2024-01-26 – 2024-01-27 (×2): 20 mg via ORAL
  Filled 2024-01-26 (×2): qty 1

## 2024-01-26 MED ORDER — PANTOPRAZOLE SODIUM 40 MG PO TBEC
40.0000 mg | DELAYED_RELEASE_TABLET | Freq: Every day | ORAL | Status: DC
  Administered 2024-01-26: 40 mg via ORAL
  Filled 2024-01-26: qty 1

## 2024-01-26 MED ORDER — ATORVASTATIN CALCIUM 20 MG PO TABS
40.0000 mg | ORAL_TABLET | Freq: Every day | ORAL | Status: DC
  Administered 2024-01-26: 40 mg via ORAL
  Filled 2024-01-26: qty 2

## 2024-01-26 MED ORDER — CLOPIDOGREL BISULFATE 75 MG PO TABS
75.0000 mg | ORAL_TABLET | Freq: Every day | ORAL | Status: DC
  Administered 2024-01-26: 75 mg via ORAL
  Filled 2024-01-26: qty 1

## 2024-01-26 MED ORDER — ALLOPURINOL 300 MG PO TABS
300.0000 mg | ORAL_TABLET | Freq: Every day | ORAL | Status: DC
  Administered 2024-01-26 – 2024-01-27 (×2): 300 mg via ORAL
  Filled 2024-01-26 (×2): qty 1

## 2024-01-26 MED ORDER — SALINE SPRAY 0.65 % NA SOLN: 1.0000 | NASAL | Status: DC | PRN

## 2024-01-26 NOTE — Progress Notes (Signed)
*  PRELIMINARY RESULTS* Echocardiogram 2D Echocardiogram has been performed.  Brooke Davis 01/26/2024, 10:57 AM

## 2024-01-26 NOTE — Progress Notes (Signed)
 NEUROLOGY CONSULT FOLLOW UP NOTE   Date of service: January 26, 2024 Patient Name: Brooke Davis MRN:  [id number] DOB:  1941-04-12  Interval Hx/subjective  Seen and examined Awake alert  Vitals   Vitals:   01/26/24 0600 01/26/24 0609 01/26/24 0615 01/26/24 0700  BP: (!) 179/80 (!) 170/63 (!) 155/74 (!) 146/61  Pulse: 67 70 79 72  Resp: 20 14 13 17   Temp:      TempSrc:      SpO2: 90%  97% 95%  Weight:      Height:         Body mass index is 30.93 kg/m.  Physical Exam  General: Awake alert in no distress HEENT: Normocephalic dermatic Lungs: Clear Cardiovascular: Regular rhythm Neurological exam Awake alert oriented x 3 No dysarthria No aphasia Cranial nerves II to XII intact Motor examination with no drift in the upper extremities although she has a lot of pain in the left upper extremity.  Both lower extremities are antigravity for 5 seconds with coaching. Sensation intact to light touch No gross dysmetria  Medications  Current Facility-Administered Medications:    acetaminophen  (TYLENOL ) tablet 650 mg, 650 mg, Oral, Q4H PRN **OR** acetaminophen  (TYLENOL ) 160 MG/5ML solution 650 mg, 650 mg, Per Tube, Q4H PRN **OR** acetaminophen  (TYLENOL ) suppository 650 mg, 650 mg, Rectal, Q4H PRN, Ouma, Almarie Bake, NP   Chlorhexidine  Gluconate Cloth 2 % PADS 6 each, 6 each, Topical, Daily, Kasa, Kurian, MD, 6 each at 01/25/24 0907   morphine  (PF) 2 MG/ML injection 2 mg, 2 mg, Intravenous, Q4H PRN, Ouma, Elizabeth Achieng, NP, 2 mg at 01/25/24 0035   Oral care mouth rinse, 15 mL, Mouth Rinse, PRN, Ouma, Almarie Bake, NP   pantoprazole  (PROTONIX ) injection 40 mg, 40 mg, Intravenous, QHS, Ouma, Almarie Bake, NP, 40 mg at 01/25/24 2146   senna-docusate (Senokot-S) tablet 1 tablet, 1 tablet, Oral, QHS PRN, Ouma, Elizabeth Achieng, NP   sodium chloride  flush (NS) 0.9 % injection 3 mL, 3 mL, Intravenous, Once, Levander Slate, MD  Labs and Diagnostic Imaging   CBC:  Recent Labs   Lab 01/24/24 2107 01/26/24 0530  WBC 7.5 9.4  NEUTROABS 4.8  --   HGB 12.5 11.8*  HCT 37.6 35.7*  MCV 105.0* 105.9*  PLT 206 168    Basic Metabolic Panel:  Lab Results  Component Value Date   NA 141 01/26/2024   K 4.3 01/26/2024   CO2 27 01/26/2024   GLUCOSE 120 (H) 01/26/2024   BUN 24 (H) 01/26/2024   CREATININE 1.17 (H) 01/26/2024   CALCIUM  10.1 01/26/2024   GFRNONAA 47 (L) 01/26/2024   Lipid Panel:  Lab Results  Component Value Date   LDLCALC 61 01/25/2024   Alcohol Level     Component Value Date/Time   [facility] <15 01/24/2024 2107   INR  Lab Results  Component Value Date   INR 1.1 01/24/2024   APTT  Lab Results  Component Value Date   APTT 34 01/24/2024  Urinalysis concerning for moderate leukocyte esterase, rare bacteria, 21-50 white cells.  CT Head without contrast(Personally reviewed): Motion limited-aspects 10.  No bleed.  CT angio Head and Neck with contrast(Personally reviewed): No ELVO.  Unchanged right ICA origin 80% stenosis from previous scan.  Interval stenting of left carotid bifurcation/proximal ICA with patent stent.  Bilateral P2 PCA stenosis.  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 CT perfusion study with 37 cc penumbra in the right cerebral hemisphere/ICA territory potentially due to underlying right ACA stenosis or  artifact.  Degree of penumbra may be overestimated given likely artifactual mismatch in the inferior cerebellum.  MRI brain overnight with no acute findings  Assessment   Tehillah Cipriani is a 83 y.o. female history of bilateral carotid stenosis status post stenting of the left carotid last year, history of atrial fibrillation not on anticoagulation, heart failure, type II DM, prior stroke, anxiety, depression presented as a code stroke with acute onset of aphasia and left facial droop. Evaluated by telestroke-TNK given by telestroke. Remained encephalopathic yesterday but today has no focal deficits.  Had  fevers which probably were related to her Precedex . MRI negative for acute stroke.  Current presentation likely secondary to either a stroke aborted by thrombolytic or a stroke mimic of toxic metabolic encephalopathy in the setting of UTI.  Impression: Acute ischemic stroke aborted by thrombolytic versus stroke mimic Toxic metabolic encephalopathy-question UTI as the etiology Prior history of strokes Right carotid stenosis Left carotid stenosis status post stent  Recommendations  Resume home Plavix  2D echo pending Continue home statin Management of UTI per primary team Therapy assessments Will follow  Plan discussed with Dr Dezii  ______________________________________________________________________   Signed, Eligio Lav, MD [facility]

## 2024-01-26 NOTE — Evaluation (Signed)
 Clinical/Bedside Swallow Evaluation Patient Details  Name: Brooke Davis MRN: [id number] Date of Birth: 11/18/1940  Today's Date: 01/26/2024 Time: SLP Start Time (ACUTE ONLY): 0755 SLP Stop Time (ACUTE ONLY): 0805 SLP Time Calculation (min) (ACUTE ONLY): 10 min  Past Medical History: History reviewed. No pertinent past medical history. Past Surgical History: History reviewed. No pertinent surgical history. HPI:  Per HPI 83 year old female with history of hypertension, diabetes, multiple prior strokes, A-fib not on anticoagulation presenting to the emergency department for evaluation of aphasia as a code stroke. S/p tnk, no LVO.    Assessment / Plan / Recommendation  Clinical Impression  Pt seen for clinical swallowing evaluation. Pt presents with s/sx mild oral dysphagia c/b mildly prolonged mastication of solids in setting of missing lower dentition. Pt also with trace-mild oral residual which adhered to upper plate and cleared with liquid wash. Pharyngeal swallow appears WFL. Recommend diet downgrade to mech soft and thin liquids with safe swallowing strategies as outlined below. SLP to f/u x1 for tolerance. SLP Visit Diagnosis: Dysphagia, oral phase (R13.11)    Aspiration Risk  Mild aspiration risk    Diet Recommendation Dysphagia 3 (Mech soft);No liquids    Liquid Administration via: Spoon;Cup;Straw Medication Administration:  (as tolerated) Supervision: Patient able to self feed Compensations: Minimize environmental distractions;Slow rate;Small sips/bites;Follow solids with liquid    Other  Recommendations Oral Care Recommendations: Oral care QID;Staff/trained caregiver to provide oral care        Functional Status Assessment Patient has had a recent decline in their functional status and demonstrates the ability to make significant improvements in function in a reasonable and predictable amount of time.  Frequency and Duration min 2x/week  2 weeks       Prognosis  Prognosis for improved oropharyngeal function: Fair Barriers to Reach Goals:  (dental status)      Swallow Study   General Date of Onset: 01/24/24 HPI: Per HPI 83 year old female with history of hypertension, diabetes, multiple prior strokes, A-fib not on anticoagulation presenting to the emergency department for evaluation of aphasia as a code stroke. S/p tnk, no LVO. Type of Study: Bedside Swallow Evaluation Diet Prior to this Study: Regular;Thin liquids (Level 0) Temperature Spikes Noted: Yes Respiratory Status: Room air History of Recent Intubation: No Behavior/Cognition: Alert;Cooperative;Pleasant mood;Confused Oral Cavity Assessment: Within Functional Limits (sores lower arch) Oral Care Completed by SLP: Yes Oral Cavity - Dentition: Dentures, top;Missing dentition Vision: Functional for self-feeding Self-Feeding Abilities: Able to feed self Patient Positioning: Upright in bed Baseline Vocal Quality: Normal Volitional Cough: Strong Volitional Swallow: Able to elicit    Oral/Motor/Sensory Function Overall Oral Motor/Sensory Function: Mild impairment Facial ROM: Reduced left Facial Symmetry: Abnormal symmetry right   Ice Chips Ice chips: Within functional limits   Thin Liquid Thin Liquid: Within functional limits Presentation: Straw    Nectar Thick Nectar Thick Liquid: Not tested   Honey Thick Honey Thick Liquid: Not tested   Puree Puree: Within functional limits Presentation: Spoon   Solid     Solid: Impaired Oral Phase Impairments: Impaired mastication Oral Phase Functional Implications: Impaired mastication Pharyngeal Phase Impairments:  (WFL)     Brooke Davis, M.S., CCC-SLP Speech-Language Pathologist [facility] [phone] (ASCOM)  Brooke Davis 01/26/2024,8:56 AM

## 2024-01-26 NOTE — Progress Notes (Signed)
 PROGRESS NOTE    Brooke Davis  FMW:[id number] DOB: 04/03/1941 DOA: 01/24/2024 PCP: Valora Agent, MD  Chief Complaint  Patient presents with   Code Stroke    Hospital Course:  Brooke Davis is a 83 year old female with bilateral carotid stenosis status post stenting of the left carotid last year, history of A-fib not on anticoagulation, heart failure, type 2 diabetes, prior CVA, anxiety, depression, who presented as a code stroke with acute onset aphasia and left facial droop.  She was evaluated by telestroke.  TNK was administered.  Brain MRI negative for acute stroke.  She was monitored in the ICU and transitioned out this morning 7/9.  Subjective: No acute events overnight. On evaluation today patient has no complaints. She reports feeling back to normal  Objective: Vitals:   01/26/24 1300 01/26/24 1400 01/26/24 1500 01/26/24 1600  BP:  (!) 144/116 (!) 115/98 (!) 140/89  Pulse: 83 74 82 78  Resp: 18 (!) 24    Temp:      TempSrc:      SpO2: 95% (!) 69% 96% 93%  Weight:      Height:        Intake/Output Summary (Last 24 hours) at 01/26/2024 1658 Last data filed at 01/26/2024 0200 Gross per 24 hour  Intake 1169.49 ml  Output 350 ml  Net 819.49 ml   Filed Weights   01/24/24 2129 01/25/24 0139  Weight: 92.3 kg 84.3 kg    Examination: General exam: Appears calm and comfortable, NAD  Respiratory system: No work of breathing, symmetric chest wall expansion Cardiovascular system: S1 & S2 heard, RRR.  Gastrointestinal system: Abdomen is nondistended, soft and nontender.  Neuro: Alert and oriented. No focal neurological deficits. Extremities: Symmetric, expected ROM Skin: No rashes, lesions Psychiatry: Demonstrates appropriate judgement and insight. Mood & affect appropriate for situation.   Assessment & Plan:  Principal Problem:   Acute CVA (cerebrovascular accident) (HCC)   Acute CVA - Patient presented as a code stroke.  Received TNK on 7/7.  Initially managed in  ICU - TRH assumed care 7/9. - Neurology consulted - Presumed Acute ischemic stroke aborted by thrombolytics - Given history of prior CVA this is likely - Resume home dose Plavix  - Echocardiogram: EF 60 to 65%, no regional wall motion abnormality, mild left ventricular hypertrophy, diastolic parameters indeterminate.  Mildly dilated right and left atrium.  Mitral valve regurg. - Continue statin.  LDL at goal. - PT/OT recommending home health.  Home health services have been ordered  Elevation in kidney function - Baseline creatinine was 0.97, has risen to 1.17 today with subsequent rise in BUN.  Have encouraged p.o. rehydration -Repeat CMP in a.m. - Creatinine clearance 41 if needed  UTI ruled out - UA with leukocytes, no nitrites.  Dehydrated specimen - Patient did have fever earlier this admission though that was thought to be secondary to Precedex . - Questionable vesicovaginal fistula on CT.  No LUTS  Left-sided pleural effusion - Patient is on room air and comfortable - Repeat CXR to ensure clearance. Pt does take lasix daily at home.   Anxiety Depression - Resume home meds  Type 2 diabetes --Hemoglobin A1c 5.5%. - No indication for sliding scale insulin at this time  Atrial fibrillation, not on anticoagulation Carotid stent placement, on Plavix  CAD status post [facility] 6/25 2022. History of inferior wall STEMI - Have resumed home statin and Plavix  now.  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  DVT prophylaxis: s/p TNK  this admission. SCDs for now   Code Status: Full Code Disposition:  Home health PT at DC, likely tomorrow if stable.  Consultants:  Treatment Team:  Consulting Physician: Voncile Isles, MD  Procedures:    Antimicrobials:  Anti-infectives (From admission, onward)    None       Data Reviewed: I have personally reviewed following labs and imaging studies CBC: Recent Labs  Lab 01/24/24 2107  01/26/24 0530  WBC 7.5 9.4  NEUTROABS 4.8  --   HGB 12.5 11.8*  HCT 37.6 35.7*  MCV 105.0* 105.9*  PLT 206 168   Basic Metabolic Panel: Recent Labs  Lab 01/24/24 2107 01/26/24 0530  NA 139 141  K 3.8 4.3  CL 98 101  CO2 28 27  GLUCOSE 148* 120*  BUN 24* 24*  CREATININE 0.97 1.17*  CALCIUM  10.0 10.1  MG  --  1.9  PHOS  --  3.3   GFR: Estimated Creatinine Clearance: 41.1 mL/min (A) (by C-G formula based on SCr of 1.17 mg/dL (H)). Liver Function Tests: Recent Labs  Lab 01/24/24 2107  AST 33  ALT 22  ALKPHOS 85  BILITOT 0.6  PROT 7.5  ALBUMIN 4.0   CBG: Recent Labs  Lab 01/25/24 0121  GLUCAP 208*    Recent Results (from the past 240 hours)  MRSA Next Gen by PCR, Nasal     Status: None   Collection Time: 01/25/24  1:36 AM   Specimen: Nasal Mucosa; Nasal Swab  Result Value Ref Range Status   MRSA by PCR Next Gen NOT DETECTED NOT DETECTED Final    Comment: (NOTE) The GeneXpert MRSA Assay (FDA approved for NASAL specimens only), is one component of a comprehensive MRSA colonization surveillance program. It is not intended to diagnose MRSA infection nor to guide or monitor treatment for MRSA infections. Test performance is not FDA approved in patients less than 47 years old. Performed at [facility], [address] [place], [place] [address]      Radiology Studies: ECHOCARDIOGRAM COMPLETE Result Date: 01/26/2024    ECHOCARDIOGRAM REPORT   Patient Name:   Brooke Davis Date of Exam: 01/26/2024 Medical Rec #:  [id number]      Height:       65.0 in Accession #:    [id number]     Weight:       185.8 lb Date of Birth:  Nov 25, 1940      BSA:          1.917 m Patient Age:    82 years       BP:           143/68 mmHg Patient Gender: F              HR:           72 bpm. Exam Location:  [facility] Procedure: 2D Echo, Cardiac Doppler, Color Doppler and Saline Contrast Bubble            Study (Both Spectral and Color Flow Doppler were utilized during            procedure).  Indications:     Stroke I63.9  History:         Patient has no prior history of Echocardiogram examinations. No                  medical histories on file.  Sonographer:     Christopher Furnace Referring Phys:  [id number] [facility] Diagnosing Phys: Evalene Lunger MD IMPRESSIONS  1. Left ventricular ejection fraction, by estimation, is 60 to 65%. The left ventricle has normal function. The left ventricle has no regional wall motion abnormalities. There is mild left ventricular hypertrophy. Left ventricular diastolic parameters are indeterminate.  2. Right ventricular systolic function is normal. The right ventricular size is normal.  3. Left atrial size was moderately dilated.  4. Right atrial size was moderately dilated.  5. The mitral valve is normal in structure. Mild mitral valve regurgitation. No evidence of mitral stenosis.  6. The aortic valve is normal in structure. Aortic valve regurgitation is not visualized. Aortic valve sclerosis is present, with no evidence of aortic valve stenosis.  7. The inferior vena cava is normal in size with greater than 50% respiratory variability, suggesting right atrial pressure of 3 mmHg.  8. Agitated saline contrast bubble study was negative, with no evidence of any interatrial shunt. FINDINGS  Left Ventricle: Left ventricular ejection fraction, by estimation, is 60 to 65%. The left ventricle has normal function. The left ventricle has no regional wall motion abnormalities. Strain was performed and the global longitudinal strain is indeterminate. The left ventricular internal cavity size was normal in size. There is mild left ventricular hypertrophy. Left ventricular diastolic parameters are indeterminate. Right Ventricle: The right ventricular size is normal. No increase in right ventricular wall thickness. Right ventricular systolic function is normal. Left Atrium: Left atrial size was moderately dilated. Right Atrium: Right atrial size was moderately dilated. Pericardium:  There is no evidence of pericardial effusion. Mitral Valve: The mitral valve is normal in structure. Mild mitral annular calcification. Mild mitral valve regurgitation. No evidence of mitral valve stenosis. Tricuspid Valve: The tricuspid valve is normal in structure. Tricuspid valve regurgitation is not demonstrated. No evidence of tricuspid stenosis. Aortic Valve: The aortic valve is normal in structure. Aortic valve regurgitation is not visualized. Aortic valve sclerosis is present, with no evidence of aortic valve stenosis. Aortic valve mean gradient measures 11.3 mmHg. Aortic valve peak gradient measures 19.8 mmHg. Aortic valve area, by VTI measures 1.53 cm. Pulmonic Valve: The pulmonic valve was normal in structure. Pulmonic valve regurgitation is not visualized. No evidence of pulmonic stenosis. Aorta: The aortic root is normal in size and structure. Venous: The inferior vena cava is normal in size with greater than 50% respiratory variability, suggesting right atrial pressure of 3 mmHg. IAS/Shunts: No atrial level shunt detected by color flow Doppler. Agitated saline contrast was given intravenously to evaluate for intracardiac shunting. Agitated saline contrast bubble study was negative, with no evidence of any interatrial shunt. There  is no evidence of a patent foramen ovale. There is no evidence of an atrial septal defect. Additional Comments: 3D was performed not requiring image post processing on an independent workstation and was indeterminate.  LEFT VENTRICLE PLAX 2D LVIDd:         4.40 cm LVIDs:         2.90 cm LV PW:         1.20 cm LV IVS:        1.10 cm LVOT diam:     2.00 cm LV SV:         66 LV SV Index:   34 LVOT Area:     3.14 cm  RIGHT VENTRICLE RV Basal diam:  4.90 cm RV Mid diam:    3.30 cm LEFT ATRIUM             Index        RIGHT ATRIUM  Index LA diam:        4.80 cm 2.50 cm/m   RA Area:     31.00 cm LA Vol (A2C):   88.7 ml 46.26 ml/m  RA Volume:   117.00 ml 61.02 ml/m LA  Vol (A4C):   83.8 ml 43.71 ml/m LA Biplane Vol: 87.1 ml 45.43 ml/m  AORTIC VALVE AV Area (Vmax):    1.41 cm AV Area (Vmean):   1.47 cm AV Area (VTI):     1.53 cm AV Vmax:           222.33 cm/s AV Vmean:          154.000 cm/s AV VTI:            0.432 m AV Peak Grad:      19.8 mmHg AV Mean Grad:      11.3 mmHg LVOT Vmax:         99.90 cm/s LVOT Vmean:        72.300 cm/s LVOT VTI:          0.210 m LVOT/AV VTI ratio: 0.49  AORTA Ao Root diam: 3.20 cm MITRAL VALVE                TRICUSPID VALVE MV Area (PHT): 3.85 cm     TR Peak grad:   30.0 mmHg MV Decel Time: 197 msec     TR Vmax:        274.00 cm/s MV E velocity: 116.00 cm/s                             SHUNTS                             Systemic VTI:  0.21 m                             Systemic Diam: 2.00 cm Evalene Lunger MD Electronically signed by Evalene Lunger MD Signature Date/Time: 01/26/2024/3:05:22 PM    Final    MR BRAIN WO CONTRAST Result Date: 01/26/2024 CLINICAL DATA:  Initial evaluation for acute neuro deficit, stroke suspected. EXAM: MRI HEAD WITHOUT CONTRAST TECHNIQUE: Multiplanar, multiecho pulse sequences of the brain and surrounding structures were obtained without intravenous contrast. COMPARISON:  Comparison made with prior CTs from 01/24/2024 FINDINGS: Brain: Examination degraded by motion artifact. Cerebral volume within normal limits for age. Patchy and confluent T2/FLAIR hyperintensity involving the periventricular and deep white matter both cerebral hemispheres as well as the pons, consistent with chronic small vessel ischemic disease, moderate to advanced in nature. Few scattered superimposed remote lacunar infarcts present about the hemispheric cerebral white matter, more pronounced within the left cerebral hemisphere as compared to the right. Probable small remote cortical infarct at the high posterior left parietal lobe (series 11, image 45). No evidence for acute or subacute infarct. Gray-white matter differentiation maintained. No  acute or chronic intracranial blood products. No mass lesion, midline shift or mass effect. Mild ventricular prominence related to global parenchymal volume loss of hydrocephalus. No extra-axial fluid collection. Pituitary gland within normal limits. Vascular: Loss of normal flow void within the hypoplastic intradural right V4 segment, consistent with chronic occlusion as seen on prior CTA. Major intracranial vascular flow voids are otherwise maintained. Skull and upper cervical spine: Craniocervical junction within normal limits. Bone marrow signal intensity within normal limits. No scalp  soft tissue abnormality. Sinuses/Orbits: Prior bilateral ocular lens replacement. Paranasal sinuses are largely clear. No mastoid effusion. Other: None. IMPRESSION: 1. No acute intracranial abnormality. 2. Moderate to advanced chronic microvascular ischemic disease with a few scattered remote lacunar infarcts involving the hemispheric cerebral white matter. Electronically Signed   By: Morene Hoard M.D.   On: 01/26/2024 04:27   DG Chest Port 1 View Result Date: 01/25/2024 CLINICAL DATA:  Pneumonia.  Atrial fibrillation. EXAM: PORTABLE CHEST 1 VIEW COMPARISON:  03/18/2023 FINDINGS: Cardiomegaly remains stable. Small sub pulmonic left pleural effusion is seen with mild atelectasis or infiltrate in left lung base. Right lung is grossly clear. IMPRESSION: Small sub pulmonic left pleural effusion, with mild left basilar atelectasis versus infiltrate. Recommend continued chest radiographic follow-up to confirm resolution. Stable cardiomegaly. Electronically Signed   By: Norleen DELENA Kil M.D.   On: 01/25/2024 10:13   CT ABDOMEN PELVIS WO CONTRAST Result Date: 01/25/2024 CLINICAL DATA:  Sepsis EXAM: CT ABDOMEN AND PELVIS WITHOUT CONTRAST TECHNIQUE: Multidetector CT imaging of the abdomen and pelvis was performed following the standard protocol without IV contrast. RADIATION DOSE REDUCTION: This exam was performed according to the  departmental dose-optimization program which includes automated exposure control, adjustment of the mA and/or kV according to patient size and/or use of iterative reconstruction technique. COMPARISON:  None Available. FINDINGS: Lower chest: Bibasilar atelectasis. Mild cardiomegaly and coronary artery disease. Hepatobiliary: 2 cm gallstone layering within the gallbladder. No evidence of acute cholecystitis or biliary ductal dilatation. No focal hepatic abnormality. Pancreas: No focal abnormality or ductal dilatation. Spleen: No focal abnormality.  Normal size. Adrenals/Urinary Tract: Adrenal glands are normal. Contrast fills the collecting systems, ureters and bladder from earlier CT angio head. No hydronephrosis. No suspicious renal abnormality. Urinary bladder unremarkable. Stomach/Bowel: Normal appendix. Stomach, large and small bowel grossly unremarkable. Vascular/Lymphatic: No evidence of aneurysm or adenopathy. Aortic atherosclerosis. Reproductive: Prior hysterectomy. No adnexal masses. 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 A part of the fistula may be seen along the inferior bladder on sagittal image 68, series 6. Other: No free fluid or free air. Musculoskeletal: No acute bony abnormality. Prior right hip replacement. IMPRESSION: Contrast seen in the vagina. 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 Contrast fills the urinary bladder from earlier CTA head. Cholelithiasis.  No CT evidence of acute cholecystitis. Cardiomegaly, coronary artery disease.  Aortic atherosclerosis. Electronically Signed   By: Franky Crease M.D.   On: 01/25/2024 02:15   CT ANGIO HEAD NECK W WO CM W PERF (CODE STROKE) Result Date: 01/24/2024 CLINICAL DATA:  Neuro deficit, acute, stroke suspected EXAM: CT ANGIOGRAPHY HEAD AND NECK CT PERFUSION BRAIN TECHNIQUE: Multidetector CT imaging of the head and neck was performed using the standard protocol during bolus administration  of intravenous contrast. Multiplanar CT image reconstructions and MIPs were obtained to evaluate the vascular anatomy. Carotid stenosis measurements (when applicable) are obtained utilizing NASCET criteria, using the distal internal carotid diameter as the denominator. Multiphase CT imaging of the brain was performed following IV bolus contrast injection. Subsequent parametric perfusion maps were calculated using RAPID software. RADIATION DOSE REDUCTION: This exam was performed according to the departmental dose-optimization program which includes automated exposure control, adjustment of the mA and/or kV according to patient size and/or use of iterative reconstruction technique. CONTRAST:  OMNIPAQUE  IOHEXOL  350 MG/ML SOLN COMPARISON:  CTA head neck 01/29/2023. FINDINGS: CTA NECK FINDINGS Limited evaluation due to poor contrast bolus timing. Within this limitation: Aortic arch: [place]  vessel origins are patent without significant stenosis Right carotid system: Atherosclerosis at carotid bifurcation with approximately 80% stenosis. Left carotid system: Carotid stent in place which traverses the carotid bifurcation and is patent. Vertebral arteries: Left dominant. Similar occlusion of the distal right vertebral artery with poor opacification proximally. Left vertebral artery is patent with similar moderate origin stenosis. Skeleton: No acute abnormality on limited assessment. Other neck: No acute abnormality on limited assessment. Upper chest: No acute abnormality on limited assessment. Review of the MIP images confirms the above findings CTA HEAD FINDINGS Limited evaluation due to poor contrast bolus timing. Within this limitation: Anterior circulation: Atherosclerosis of bilateral intracranial ICAs. The intracranial ICAs and proximal MCAs are patent. The proximal ACAs are patent bilaterally. Limited distal MCA and ACA evaluation due to poor contrast bolus timing and motion. Posterior circulation: Similar severe  atherosclerotic narrowing of the left intradural vertebral artery proximally. Poorly opacified right intradural vertebral artery, chronic. The basilar artery and bilateral posterior arteries are patent. Similar small right P1 PCA with bilateral P2 PCA stenoses. Mid to distal PCAs are poorly evaluated due to contrast bolus timing. Venous sinuses: Not well evaluated. Review of the MIP images confirms the above findings CT Brain Perfusion Findings: ASPECTS: 10. CBF (<30%) Volume: 0mL Perfusion (Tmax>6.0s) volume: 37mL Mismatch Volume: 37mL Infarction Location:No core infarct identified. IMPRESSION: CTA: 1. Limited study without evidence of proximal large vessel occlusion. 2. 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 3. Interval stenting of the left carotid bifurcation/proximal ICA with patent stent. 4. Similar bilateral P2 PCA stenosis. 5. 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 6. Similar moderate left vertebral artery origin stenosis. CT perfusion: 1. Approximately 37 mL of reported penumbra, predominantly in the right cerebral hemisphere/ICA territory and potentially due to underlying right ICA stenosis and/or artifact. The degree of penumbra may be overestimated given likely artifactual mismatch in the inferior cerebellum. 2. No evidence of core infarct. An MRI could provide more sensitive evaluation for acute infarct if clinically warranted. Electronically Signed   By: Gilmore GORMAN Molt M.D.   On: 01/24/2024 21:42   CT HEAD CODE STROKE WO CONTRAST Result Date: 01/24/2024 CLINICAL DATA:  Code stroke.  Neuro deficit, acute, stroke suspected EXAM: CT HEAD WITHOUT CONTRAST TECHNIQUE: Contiguous axial images were obtained from the base of the skull through the vertex without intravenous contrast. RADIATION DOSE REDUCTION: This exam was performed according to the departmental dose-optimization program which includes automated exposure control, adjustment of the mA  and/or kV according to patient size and/or use of iterative reconstruction technique. COMPARISON:  None Available. FINDINGS: Motion limited study.  Within this limitation Brain: No evidence of acute large vascular territory infarct acute hemorrhage mass lesion, midline shift or hydrocephalus. Advanced patchy and white matter hypodensities compatible with microvascular ischemic change. Vascular: No hyperdense vessel or unexpected calcification. Skull: Normal. Negative for fracture or focal lesion. Sinuses/Orbits: No acute finding. ASPECTS [facility] Stroke Program Early CT Score) Total score (0-10 with 10 being normal): 10. IMPRESSION: 1. Motion limited study without evidence of acute large vascular territory infarct or acute hemorrhage. 2. Advanced microvascular ischemic disease. MRI could provide more sensitive evaluation for acute infarct if clinically warranted. Electronically Signed   By: Gilmore GORMAN Molt M.D.   On: 01/24/2024 21:13    Scheduled Meds:  Chlorhexidine  Gluconate Cloth  6 each Topical Daily   clopidogrel   75 mg Oral Daily   pantoprazole  (PROTONIX ) IV  40 mg Intravenous QHS   sodium chloride  flush  3 mL  Intravenous Once   Continuous Infusions:   LOS: 2 days  MDM: Patient is high risk for one or more organ failure.  They necessitate ongoing hospitalization for continued IV therapies and subsequent lab monitoring. Total time spent interpreting labs and vitals, reviewing the medical record, coordinating care amongst consultants and care team members, directly assessing and discussing care with the patient and/or family: 55 min  Maleni Seyer, DO [facility]  To contact the attending physician between 7A-7P please use Epic Chat. To contact the covering physician during after hours 7P-7A, please review Amion.  01/26/2024, 4:58 PM   *This document has been created with the assistance of dictation software. Please excuse typographical errors. *

## 2024-01-26 NOTE — Progress Notes (Signed)
 Occupational Therapy Evaluation Patient Details Name: Brooke Davis MRN: [id number] DOB: January 20, 1941 Today's Date: 01/26/2024   History of Present Illness   Pt is an 83 y.o. female presenting to the emergency department for evaluation of aphasia as a code stroke. MRI: Motion limited study without evidence of acute large vascular  territory infarct or acute hemorrhage.  PMH: hypertension, diabetes, multiple prior strokes, A-fib not on anticoagulation     Clinical Impressions Brooke Davis was seen for OT evaluation this date. Prior to hospital admission, pt was IND w/ ADLs. Pt lives w/ family. Pt presents to acute OT demonstrating impaired ADL performance and functional mobility 2/2 generalized weakness and decreased activity tolerance (See OT problem list for additional functional deficits). Pt currently requires SUPERVISION for bed mobility; MIN A for UB dressing. Pt requires CGA + RW to ambulate ~50 ft, HR increased to 172, pt denies symptoms of elevated HR. Pt returned to bed, HR in the 80's while supine, SpO2 100% on RA. RN notified.  Pt would benefit from skilled OT services to address noted impairments and functional limitations (see below for any additional details) in order to maximize safety and independence while minimizing falls risk and caregiver burden. Do not anticipate the need for follow up OT services upon acute hospital DC.      If plan is discharge home, recommend the following:   A little help with walking and/or transfers;A little help with bathing/dressing/bathroom;Assistance with cooking/housework;Assist for transportation     Functional Status Assessment   Patient has had a recent decline in their functional status and demonstrates the ability to make significant improvements in function in a reasonable and predictable amount of time.     Equipment Recommendations   BSC/3in1     Recommendations for Other Services         Precautions/Restrictions    Precautions Precautions: Fall Recall of Precautions/Restrictions: Intact Restrictions Weight Bearing Restrictions Per Provider Order: No     Mobility Bed Mobility Overal bed mobility: Needs Assistance Bed Mobility: Supine to Sit, Sit to Supine     Supine to sit: Supervision Sit to supine: Supervision        Transfers Overall transfer level: Needs assistance Equipment used: Rolling walker (2 wheels) Transfers: Sit to/from Stand Sit to Stand: Contact guard assist                  Balance Overall balance assessment: Needs assistance Sitting-balance support: No upper extremity supported, Feet supported Sitting balance-Leahy Scale: Good     Standing balance support: Bilateral upper extremity supported, Reliant on assistive device for balance Standing balance-Leahy Scale: Fair                             ADL either performed or assessed with clinical judgement   ADL Overall ADL's : Needs assistance/impaired                                       General ADL Comments: MIN A for UB dressing, SBA-CGA ADL t/f     Vision         Perception         Praxis         Pertinent Vitals/Pain Pain Assessment Pain Assessment: No/denies pain     Extremity/Trunk Assessment Upper Extremity Assessment Upper Extremity Assessment: Overall WFL for tasks assessed   Lower  Extremity Assessment Lower Extremity Assessment: Generalized weakness   Cervical / Trunk Assessment Cervical / Trunk Assessment: Kyphotic   Communication Communication Communication: No apparent difficulties Factors Affecting Communication: Reduced clarity of speech   Cognition Arousal: Alert Behavior During Therapy: WFL for tasks assessed/performed Cognition: No family/caregiver present to determine baseline             OT - Cognition Comments: appropriate conversation, assess orientation next session                 Following commands: Intact        Cueing  General Comments   Cueing Techniques: Verbal cues;Gestural cues  HR 172 w/ ambulation, 80 at rest   Exercises     Shoulder Instructions      Home Living Family/patient expects to be discharged to:: Private residence Living Arrangements: Other relatives (grandson) Available Help at Discharge: Family Type of Home: House Home Access: Level entry     Home Layout: One level     Bathroom Shower/Tub: Tub only   Firefighter: Handicapped height Bathroom Accessibility: No   Home Equipment: Agricultural consultant (2 wheels);BSC/3in1;Shower seat;Grab bars - toilet;Grab bars - tub/shower;Toilet riser      Lives With: Family    Prior Functioning/Environment Prior Level of Function : Independent/Modified Independent             Mobility Comments: uses RW to ambulate ADLs Comments: ind with ADLs    OT Problem List: Decreased activity tolerance;Impaired balance (sitting and/or standing)   OT Treatment/Interventions: Self-care/ADL training;Patient/family education      OT Goals(Current goals can be found in the care plan section)   Acute Rehab OT Goals Patient Stated Goal: to go home OT Goal Formulation: With patient Time For Goal Achievement: 02/09/24 Potential to Achieve Goals: Good ADL Goals Pt Will Perform Grooming: with modified independence;sitting;standing Pt Will Perform Lower Body Dressing: with supervision;sitting/lateral leans;sit to/from stand Pt Will Transfer to Toilet: with modified independence;ambulating;regular height toilet   OT Frequency:  Min 1X/week    Co-evaluation PT/OT/SLP Co-Evaluation/Treatment: Yes Reason for Co-Treatment: For patient/therapist safety;To address functional/ADL transfers PT goals addressed during session: Mobility/safety with mobility;Balance;Proper use of DME;Strengthening/ROM OT goals addressed during session: ADL's and self-care      AM-PAC OT 6 Clicks Daily Activity     Outcome Measure Help from another  person eating meals?: None Help from another person taking care of personal grooming?: None Help from another person toileting, which includes using toliet, bedpan, or urinal?: A Little Help from another person bathing (including washing, rinsing, drying)?: A Little Help from another person to put on and taking off regular upper body clothing?: A Little Help from another person to put on and taking off regular lower body clothing?: A Little 6 Click Score: 20   End of Session Equipment Utilized During Treatment: Gait belt  Activity Tolerance: Patient tolerated treatment well Patient left: in bed;with call bell/phone within reach;with bed alarm set  OT Visit Diagnosis: Unsteadiness on feet (R26.81);Other abnormalities of gait and mobility (R26.89)                Time: 8940-8875 OT Time Calculation (min): 25 min Charges:  OT General Charges $OT Visit: 1 Visit OT Evaluation $OT Eval Moderate Complexity: 1 Mod OT Treatments $Self Care/Home Management : 8-22 mins  [place]ston Shropshire, Student OT   [employer] 01/26/2024, 1:40 PM

## 2024-01-26 NOTE — Evaluation (Signed)
 Physical Therapy Evaluation Patient Details Name: Brooke Davis MRN: [id number] DOB: 04-Apr-1941 Today's Date: 01/26/2024  History of Present Illness  Pt is an 83 y.o. female presenting to the emergency department for evaluation of aphasia as a code stroke. MRI: Motion limited study without evidence of acute large vascular  territory infarct or acute hemorrhage.  PMH: hypertension, diabetes, multiple prior strokes, A-fib not on anticoagulation  Clinical Impression  Co-treat performed this date.Pt is a pleasant 83 year old female who was admitted for suspected acute CVA. Pt's vitals monitored throughout session. Pt performs bed mobility with CGA to supervision assist, minimal verbal cueing needed to initiate and complete movement, no concerns of pain. STS transfer performed with CGA, use of RW, no LOB experienced. Ambulation with RW performed, pt with no LOB but HR spiked to 172 bpm after 25', pt remaining asymptomatic, returned to bed. Pt vitals at end of session stable at 80 bpm and 99% SpO2. Pt demonstrates deficits with activity tolerance, strength, and balance and would benefit from skilled PT interventions to meet therapy needs. PT to follow acutely as appropriate.          If plan is discharge home, recommend the following: A little help with walking and/or transfers;A little help with bathing/dressing/bathroom;Assistance with cooking/housework;Assist for transportation;Help with stairs or ramp for entrance   Can travel by private vehicle        Equipment Recommendations None recommended by PT  Recommendations for Other Services       Functional Status Assessment Patient has had a recent decline in their functional status and demonstrates the ability to make significant improvements in function in a reasonable and predictable amount of time.     Precautions / Restrictions Precautions Precautions: Fall Recall of Precautions/Restrictions: Intact Restrictions Weight Bearing  Restrictions Per Provider Order: No      Mobility  Bed Mobility Overal bed mobility: Needs Assistance Bed Mobility: Supine to Sit, Sit to Supine     Supine to sit: Contact guard, Supervision Sit to supine: Contact guard assist   General bed mobility comments: CGA to sup a with bed mobility, inital verbal cueing for initiation of movement, no LOB experienced    Transfers Overall transfer level: Needs assistance Equipment used: Rolling walker (2 wheels) Transfers: Sit to/from Stand Sit to Stand: Contact guard assist           General transfer comment: CGA for STS, no LOB experienced    Ambulation/Gait Ambulation/Gait assistance: Contact guard assist Gait Distance (Feet): 50 Feet Assistive device: Rolling walker (2 wheels) Gait Pattern/deviations: Step-through pattern, Decreased step length - right, Decreased step length - left       General Gait Details: step through pattern, decreased step length and width, HR spike to 172 bpm, pt asymptomatic, no chest pain or SOB, returned to room  Stairs            Wheelchair Mobility     Tilt Bed    Modified Rankin (Stroke Patients Only)       Balance Overall balance assessment: Needs assistance Sitting-balance support: No upper extremity supported, Feet supported Sitting balance-Leahy Scale: Good Sitting balance - Comments: sitting EOB   Standing balance support: Bilateral upper extremity supported Standing balance-Leahy Scale: Fair Standing balance comment: use of RW                             Pertinent Vitals/Pain Pain Assessment Pain Assessment: No/denies pain    Home  Living Family/patient expects to be discharged to:: Private residence Living Arrangements: Other relatives (grandson) Available Help at Discharge: Family Type of Home: House Home Access: Level entry       Home Layout: One level Home Equipment: Agricultural consultant (2 wheels);BSC/3in1;Shower seat;Grab bars - toilet;Grab bars -  tub/shower;Toilet riser      Prior Function Prior Level of Function : Independent/Modified Independent             Mobility Comments: uses RW to ambulate ADLs Comments: ind with ADLs     Extremity/Trunk Assessment   Upper Extremity Assessment Upper Extremity Assessment: Generalized weakness    Lower Extremity Assessment Lower Extremity Assessment: Generalized weakness    Cervical / Trunk Assessment Cervical / Trunk Assessment: Kyphotic  Communication   Communication Communication: Impaired Factors Affecting Communication: Reduced clarity of speech    Cognition Arousal: Alert Behavior During Therapy: WFL for tasks assessed/performed   PT - Cognitive impairments: No apparent impairments                         Following commands: Intact       Cueing Cueing Techniques: Verbal cues, Tactile cues     General Comments      Exercises     Assessment/Plan    PT Assessment Patient needs continued PT services  PT Problem List Decreased strength;Decreased activity tolerance;Decreased balance;Decreased knowledge of use of DME;Decreased safety awareness       PT Treatment Interventions DME instruction;Gait training;Stair training;Functional mobility training;Therapeutic activities;Therapeutic exercise;Balance training;Patient/family education    PT Goals (Current goals can be found in the Care Plan section)  Acute Rehab PT Goals Patient Stated Goal: to return home PT Goal Formulation: With patient Time For Goal Achievement: 02/09/24 Potential to Achieve Goals: Good    Frequency Min 2X/week     Co-evaluation PT/OT/SLP Co-Evaluation/Treatment: Yes Reason for Co-Treatment: For patient/therapist safety;To address functional/ADL transfers PT goals addressed during session: Mobility/safety with mobility;Balance;Proper use of DME;Strengthening/ROM OT goals addressed during session: ADL's and self-care       AM-PAC PT 6 Clicks Mobility  Outcome  Measure Help needed turning from your back to your side while in a flat bed without using bedrails?: A Little Help needed moving from lying on your back to sitting on the side of a flat bed without using bedrails?: A Little Help needed moving to and from a bed to a chair (including a wheelchair)?: A Little Help needed standing up from a chair using your arms (e.g., wheelchair or bedside chair)?: A Little Help needed to walk in hospital room?: A Little Help needed climbing 3-5 steps with a railing? : A Lot 6 Click Score: 17    End of Session   Activity Tolerance: Treatment limited secondary to medical complications (Comment) (spiked HR) Patient left: in bed;with call bell/phone within reach;with bed alarm set Nurse Communication: Mobility status PT Visit Diagnosis: Muscle weakness (generalized) (M62.81);Unsteadiness on feet (R26.81)    Time: 8899-8876 PT Time Calculation (min) (ACUTE ONLY): 23 min   Charges:                   Omer Monter Romero-Perozo, SPT  01/26/2024, 1:02 PM

## 2024-01-26 NOTE — Care Management Important Message (Signed)
 Important Message  Patient Details  Name: Brooke Davis MRN: [id number] Date of Birth: 1940/10/25   Important Message Given:  Yes - Medicare IM     [name] 01/26/2024, 5:17 PM

## 2024-01-26 NOTE — Evaluation (Signed)
 Speech Language Pathology Evaluation Patient Details Name: Brooke Davis MRN: [id number] DOB: 04-02-1941 Today's Date: 01/26/2024 Time: 0805-0820 SLP Time Calculation (min) (ACUTE ONLY): 15 min  Problem List:  Patient Active Problem List   Diagnosis Date Noted   Acute CVA (cerebrovascular accident) (HCC) 01/24/2024   Past Medical History: History reviewed. No pertinent past medical history. Past Surgical History: History reviewed. No pertinent surgical history. HPI:  Per HPI 83 year old female with history of hypertension, diabetes, multiple prior strokes, A-fib not on anticoagulation presenting to the emergency department for evaluation of aphasia as a code stroke. S/p tnk, no LVO. MRI 1. No acute intracranial abnormality. 2. Moderate to advanced chronic microvascular ischemic disease with a few scattered remote lacunar infarcts involving the hemispheric cerebral white matter.  Assessment / Plan / Recommendation Clinical Impression  Pt seen for cognitive-linguistic evaluation. Evaluation completed via informal means. Pt presents with cognitive-linguistic deficits affecting temporal orientation, attention, memory, executive functioning, insight, complex auditory comprehension. Speech is largely fluent with a few instances of paucity as well as minimal articulatory imprecision. Noted pt with cognitive-linguistic deficits identified on previous stroke. ?if deficits are new vs residual. No family present ot provide collateral. SLP to f/u to monitor progress. Recommend post-acute SLP services for cognitive-linguistic evaluaiton/tx at next level of care.    SLP Assessment  SLP Recommendation/Assessment: Patient needs continued Speech Language Pathology Services SLP Visit Diagnosis: Dysphagia, oral phase (R13.11);Cognitive communication deficit (R41.841)     Assistance Recommended at Discharge  Frequent or constant Supervision/Assistance  Functional Status Assessment Patient has had a  recent decline in their functional status and demonstrates the ability to make significant improvements in function in a reasonable and predictable amount of time.  Frequency and Duration min 2x/week  2 weeks      SLP Evaluation Cognition  Overall Cognitive Status: No family/caregiver present to determine baseline cognitive functioning Arousal/Alertness: Awake/alert Orientation Level: Oriented to person;Oriented to place;Disoriented to time Year:  (2002) Attention: Selective Selective Attention: Impaired Selective Attention Impairment: Verbal basic Memory: Impaired Memory Impairment: Storage deficit;Retrieval deficit Awareness: Impaired Awareness Impairment: Emergent impairment Problem Solving:  (able to recall and demo use of call bell; able to recall and give x2 reasons to utilize 911) Executive Function: Self Monitoring;Self Correcting Self Monitoring: Impaired Self Monitoring Impairment: Verbal basic Self Correcting: Impaired Self Correcting Impairment: Verbal basic Safety/Judgment: Impaired (reduced insight into CLOF)       Comprehension  Auditory Comprehension Overall Auditory Comprehension: Impaired Yes/No Questions: Within Functional Limits Commands: Impaired Two Step Basic Commands:  (inconsistent) Complex Commands:  (inconsistent) Conversation: Simple Interfering Components: Hearing EffectiveTechniques: Extra processing time;Repetition Visual Recognition/Discrimination Discrimination: Not tested Reading Comprehension Reading Status: Not tested    Expression Expression Primary Mode of Expression: Verbal Verbal Expression Overall Verbal Expression: Impaired Initiation: No impairment Automatic Speech: Name;Social Response (WFL) Repetition: No impairment Naming: Impairment Confrontation: Impaired Pragmatics: No impairment Written Expression Dominant Hand: Right Written Expression: Not tested   Oral / Motor  Oral Motor/Sensory Function Overall Oral  Motor/Sensory Function: Mild impairment Facial ROM: Reduced left Facial Symmetry: Abnormal symmetry right Motor Speech Overall Motor Speech: Impaired Respiration: Within functional limits Phonation: Normal Resonance: Within functional limits Articulation: Impaired Level of Impairment:  (all levels; minimal) Motor Planning: Within functional limits Interfering Components: Inadequate dentition;Hearing loss           [name], M.S., CCC-SLP Speech-Language Pathologist [facility] [phone] [place])  Delon CHRISTELLA Bangs 01/26/2024, 9:06 AM

## 2024-01-27 ENCOUNTER — Telehealth (HOSPITAL_COMMUNITY): Payer: Self-pay | Admitting: Pharmacy Technician

## 2024-01-27 ENCOUNTER — Encounter: Payer: Self-pay | Admitting: Emergency Medicine

## 2024-01-27 ENCOUNTER — Other Ambulatory Visit (HOSPITAL_COMMUNITY): Payer: Self-pay

## 2024-01-27 DIAGNOSIS — Z7189 Other specified counseling: Secondary | ICD-10-CM

## 2024-01-27 DIAGNOSIS — I639 Cerebral infarction, unspecified: Secondary | ICD-10-CM | POA: Diagnosis not present

## 2024-01-27 LAB — CBC
HCT: 33.3 % — ABNORMAL LOW (ref 36.0–46.0)
Hemoglobin: 11.1 g/dL — ABNORMAL LOW (ref 12.0–15.0)
MCH: 34.8 pg — ABNORMAL HIGH (ref 26.0–34.0)
MCHC: 33.3 g/dL (ref 30.0–36.0)
MCV: 104.4 fL — ABNORMAL HIGH (ref 80.0–100.0)
Platelets: 164 K/uL (ref 150–400)
RBC: 3.19 MIL/uL — ABNORMAL LOW (ref 3.87–5.11)
RDW: 13.5 % (ref 11.5–15.5)
WBC: 9 K/uL (ref 4.0–10.5)
nRBC: 0 % (ref 0.0–0.2)

## 2024-01-27 LAB — BASIC METABOLIC PANEL WITH GFR
Anion gap: 10 (ref 5–15)
BUN: 20 mg/dL (ref 8–23)
CO2: 27 mmol/L (ref 22–32)
Calcium: 9.5 mg/dL (ref 8.9–10.3)
Chloride: 102 mmol/L (ref 98–111)
Creatinine, Ser: 0.85 mg/dL (ref 0.44–1.00)
GFR, Estimated: >60 mL/min (ref >60)
Glucose, Bld: 125 mg/dL — ABNORMAL HIGH (ref 70–99)
Potassium: 3.6 mmol/L (ref 3.5–5.1)
Sodium: 139 mmol/L (ref 135–145)

## 2024-01-27 LAB — MAGNESIUM: Magnesium: 1.7 mg/dL (ref 1.7–2.4)

## 2024-01-27 LAB — PHOSPHORUS: Phosphorus: 2.7 mg/dL (ref 2.5–4.6)

## 2024-01-27 MED ORDER — HYDRALAZINE HCL 20 MG/ML IJ SOLN: 10.0000 mg | Freq: Four times a day (QID) | INTRAMUSCULAR | Status: DC | PRN

## 2024-01-27 MED ORDER — LABETALOL HCL 5 MG/ML IV SOLN: 10.0000 mg | INTRAVENOUS | Status: DC | PRN

## 2024-01-27 MED ORDER — ASPIRIN 81 MG PO CHEW: 81.0000 mg | CHEWABLE_TABLET | Freq: Every day | ORAL | Status: DC

## 2024-01-27 MED ORDER — LOSARTAN POTASSIUM 50 MG PO TABS: 50.0000 mg | ORAL_TABLET | Freq: Every day | ORAL | 0 refills | Status: AC

## 2024-01-27 MED ORDER — APIXABAN 5 MG PO TABS
5.0000 mg | ORAL_TABLET | Freq: Two times a day (BID) | ORAL | Status: DC
  Administered 2024-01-27: 5 mg via ORAL
  Filled 2024-01-27: qty 1

## 2024-01-27 MED ORDER — APIXABAN 5 MG PO TABS: 5.0000 mg | ORAL_TABLET | Freq: Two times a day (BID) | ORAL | 0 refills | Status: AC

## 2024-01-27 MED ORDER — METOPROLOL TARTRATE 25 MG PO TABS
25.0000 mg | ORAL_TABLET | Freq: Two times a day (BID) | ORAL | Status: DC
  Administered 2024-01-27: 25 mg via ORAL
  Filled 2024-01-27: qty 1

## 2024-01-27 MED ORDER — LOSARTAN POTASSIUM 50 MG PO TABS
50.0000 mg | ORAL_TABLET | Freq: Every day | ORAL | Status: DC
  Administered 2024-01-27: 50 mg via ORAL
  Filled 2024-01-27: qty 1

## 2024-01-27 MED ORDER — ASPIRIN 81 MG PO CHEW: 81.0000 mg | CHEWABLE_TABLET | Freq: Every day | ORAL | 0 refills | Status: DC

## 2024-01-27 NOTE — Progress Notes (Signed)
 Chart review progress note  Patient's 2D echocardiogram with no unexpected findings.  From a stroke prevention standpoint, she needs to be on Eliquis  given her atrial fibrillation-this was stopped for some unclear reason last year.  I checked with vascular surgery-they are okay with Eliquis  and also would like her to be on aspirin  83 along with the Eliquis .  She can be started on Eliquis  and aspirin .  Follow-up with outpatient neurology in 6 to 8 weeks  Follow-up with vascular surgery as planned  Plan discussed with Dr. Leesa and Dr. Marea Eligio Lav, MD Neurology

## 2024-01-27 NOTE — Discharge Summary (Signed)
 Physician Discharge Summary  Brooke Davis FMW:[id number] DOB: January 27, 1941 DOA: 01/24/2024  PCP: Valora Agent, MD  Admit date: 01/24/2024 Discharge date: 01/27/2024   Recommendations for Outpatient Follow-up:  Follow up with PCP in 1-2 weeks to review blood pressure log and make necessary changes to medications. Losartan  added at discharge. 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  Hospital Course: Brooke Davis is a 83 year old female with bilateral carotid stenosis status post stenting of the left carotid last year, history of A-fib not on anticoagulation, heart failure, type 2 diabetes, prior CVA, anxiety, depression, who presented as a code stroke with acute onset aphasia and left facial droop.  She was evaluated by telestroke.  TNK was administered.  Brain MRI negative for acute stroke.  She was monitored in the ICU and transitioned to [facility] service 7/9.  She completed stroke workup with echocardiogram which revealed mildly dilated right and left atrium and indeterminate diastolic parameters. Given patient history of atrial fibrillation she was previously on Eliquis  but reports that this was discontinued after carotid stent.  After discussion with neurology, and vascular surgery, Dr. Marea, during this admission all are in agreement that patient should resume Eliquis  and aspirin  to better reduce stroke risk.  Plavix  has been discontinued Stay was briefly complicated by fever which was thought to be secondary to Precedex  (given for agitation and restlessness while receiving TNK).  Fever resolved when medication was discontinued with no further fever, signs or symptoms of infection, or leukocytosis. No antibiotics administered.   On day of discharge I discussed care plan extensively with the patient directly as well as her daughter, Brooke Davis on the phone.  All questions and concerns were answered to the best my ability    Acute CVA - Patient presented as a code stroke.  Received TNK on 7/7.  Initially managed in ICU - [facility] assumed care 7/9. - Neurology consulted - Presumed Acute ischemic stroke aborted by thrombolytics, given history of prior CVA this is likely - Restart Eliquis  and aspirin .  Discontinue Plavix  - Echocardiogram: EF 60 to 65%, no regional wall motion abnormality, mild left ventricular hypertrophy, diastolic parameters indeterminate.  Mildly dilated right and left atrium.  Mitral valve regurg. - Continue statin.  LDL at goal. - PT/OT recommending home health.  Home health services have been ordered   Elevation in kidney function - Baseline creatinine was 0.97, peaked at 1.17, now resolving to normal  UTI ruled out - UA with leukocytes, no nitrites.  Dehydrated specimen - Patient did have fever earlier this admission though that was thought to be secondary to Precedex . - Questionable vesicovaginal fistula on CT.  No LUTS   Left-sided pleural effusion - Patient is on room air and comfortable at 99% - Resume daily Lasix  at discharge   Anxiety Depression - Resume home meds   Type 2 diabetes, well-controlled --Hemoglobin A1c 5.5%.   Atrial fibrillation, not on anticoagulation Carotid stent placement, on Plavix  CAD status post [facility] 6/25 2022. History of inferior wall STEMI - Have resumed home statin - Discontinue Plavix .  Proceed with aspirin  and Eliquis  - Was previously on Entresto , since discontinued.  Blood pressure elevated this admission.  Resume home dose metoprolol , add low-dose losartan .  Continue daily low-dose Lasix .  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  Patient does endorse difficulty with incontinence - Outpatient referral to urogyn.  Discharge  Instructions  Discharge Instructions     Ambulatory referral to Neurology   Complete by: As directed    Recurrent CVA 6-8wk  hospital follow up   Ambulatory referral to Urogynecology   Complete by: As directed    Vesiculovaginal fistula   Call MD for:  difficulty breathing, headache or visual disturbances   Complete by: As directed    Call MD for:  persistant dizziness or light-headedness   Complete by: As directed    Call MD for:  persistant nausea and vomiting   Complete by: As directed    Call MD for:  severe uncontrolled pain   Complete by: As directed    Call MD for:  temperature >100.4   Complete by: As directed    Diet general   Complete by: As directed    Discharge instructions   Complete by: As directed    We have added a new blood pressure medication during this admission called losartan .  Please keep a blood pressure log.  Take your blood pressure once daily and follow-up with your primary care physician next week to review the blood pressure log.  You may require a higher dose or lower dose of this losartan  in the future.     We have added a medication called Eliquis  which is a blood thinner.  You are taking this medication to reduce your stroke risk because you have atrial fibrillation.  We are also recommending that you take aspirin  daily which will also help to reduce your future stroke risk.  Both of these medications can thin your blood.  You may notice that you have easy bruising.  If you fall, or hit your head, you are very high risk of a brain bleed.  Please report to the emergency department immediately if this occurs. Please stop taking Plavix .   Follow up with your primary care physician to discuss the medication changes during this admission   Increase activity slowly   Complete by: As directed       Allergies as of 01/27/2024   No Known Allergies      Medication List     STOP taking these medications    clopidogrel  75 MG tablet Commonly known as: PLAVIX        TAKE these medications    acetaminophen  325 MG tablet Commonly known as: TYLENOL  Take 650 mg by mouth every  6 (six) hours as needed for mild pain (pain score 1-3), fever or headache.   allopurinol  300 MG tablet Commonly known as: ZYLOPRIM  Take 300 mg by mouth daily.   ALPRAZolam  0.25 MG tablet Commonly known as: XANAX  Take 0.25 mg by mouth 2 (two) times daily as needed for anxiety.   apixaban  5 MG Tabs tablet Commonly known as: ELIQUIS  Take 1 tablet (5 mg total) by mouth 2 (two) times daily.   aspirin  81 MG chewable tablet Chew 1 tablet (81 mg total) by mouth daily. Start taking on: January 28, 2024   atorvastatin  80 MG tablet Commonly known as: LIPITOR  Take 80 mg by mouth daily.   citalopram  20 MG tablet Commonly known as: CELEXA  Take 20 mg by mouth daily.   losartan  50 MG tablet Commonly known as: COZAAR  Take 1 tablet (50 mg total) by mouth daily for 25 days. Start taking on: January 28, 2024   metoprolol  tartrate 25 MG tablet Commonly known as: LOPRESSOR  Take 25 mg by mouth 2 (two) times daily.   potassium chloride  10 MEQ tablet Commonly known as: KLOR-CON  Take 10  mEq by mouth daily.   torsemide  20 MG tablet Commonly known as: DEMADEX  Take 20 mg by mouth daily.   traMADol  50 MG tablet Commonly known as: ULTRAM  Take 50 mg by mouth every 4 (four) hours as needed for moderate pain (pain score 4-6).        No Known Allergies  Consultations:    Procedures/Studies: ECHOCARDIOGRAM COMPLETE Result Date: 01/26/2024    ECHOCARDIOGRAM REPORT   Patient Name:   DERIONA ALTEMOSE Date of Exam: 01/26/2024 Medical Rec #:  [id number]      Height:       65.0 in Accession #:    [id number]     Weight:       185.8 lb Date of Birth:  18-Aug-1940      BSA:          1.917 m Patient Age:    82 years       BP:           143/68 mmHg Patient Gender: F              HR:           72 bpm. Exam Location:  [facility] Procedure: 2D Echo, Cardiac Doppler, Color Doppler and Saline Contrast Bubble            Study (Both Spectral and Color Flow Doppler were utilized during            procedure). Indications:      Stroke I63.9  History:         Patient has no prior history of Echocardiogram examinations. No                  medical histories on file.  Sonographer:     Christopher Furnace Referring Phys:  [id number] ALMARIE BAKE OUMA Diagnosing Phys: Evalene Lunger MD IMPRESSIONS  1. Left ventricular ejection fraction, by estimation, is 60 to 65%. The left ventricle has normal function. The left ventricle has no regional wall motion abnormalities. There is mild left ventricular hypertrophy. Left ventricular diastolic parameters are indeterminate.  2. Right ventricular systolic function is normal. The right ventricular size is normal.  3. Left atrial size was moderately dilated.  4. Right atrial size was moderately dilated.  5. The mitral valve is normal in structure. Mild mitral valve regurgitation. No evidence of mitral stenosis.  6. The aortic valve is normal in structure. Aortic valve regurgitation is not visualized. Aortic valve sclerosis is present, with no evidence of aortic valve stenosis.  7. The inferior vena cava is normal in size with greater than 50% respiratory variability, suggesting right atrial pressure of 3 mmHg.  8. Agitated saline contrast bubble study was negative, with no evidence of any interatrial shunt. FINDINGS  Left Ventricle: Left ventricular ejection fraction, by estimation, is 60 to 65%. The left ventricle has normal function. The left ventricle has no regional wall motion abnormalities. Strain was performed and the global longitudinal strain is indeterminate. The left ventricular internal cavity size was normal in size. There is mild left ventricular hypertrophy. Left ventricular diastolic parameters are indeterminate. Right Ventricle: The right ventricular size is normal. No increase in right ventricular wall thickness. Right ventricular systolic function is normal. Left Atrium: Left atrial size was moderately dilated. Right Atrium: Right atrial size was moderately dilated. Pericardium: There is no  evidence of pericardial effusion. Mitral Valve: The mitral valve is normal in structure. Mild mitral annular calcification. Mild mitral valve regurgitation. No evidence of mitral valve stenosis.  Tricuspid Valve: The tricuspid valve is normal in structure. Tricuspid valve regurgitation is not demonstrated. No evidence of tricuspid stenosis. Aortic Valve: The aortic valve is normal in structure. Aortic valve regurgitation is not visualized. Aortic valve sclerosis is present, with no evidence of aortic valve stenosis. Aortic valve mean gradient measures 11.3 mmHg. Aortic valve peak gradient measures 19.8 mmHg. Aortic valve area, by VTI measures 1.53 cm. Pulmonic Valve: The pulmonic valve was normal in structure. Pulmonic valve regurgitation is not visualized. No evidence of pulmonic stenosis. Aorta: The aortic root is normal in size and structure. Venous: The inferior vena cava is normal in size with greater than 50% respiratory variability, suggesting right atrial pressure of 3 mmHg. IAS/Shunts: No atrial level shunt detected by color flow Doppler. Agitated saline contrast was given intravenously to evaluate for intracardiac shunting. Agitated saline contrast bubble study was negative, with no evidence of any interatrial shunt. There  is no evidence of a patent foramen ovale. There is no evidence of an atrial septal defect. Additional Comments: 3D was performed not requiring image post processing on an independent workstation and was indeterminate.  LEFT VENTRICLE PLAX 2D LVIDd:         4.40 cm LVIDs:         2.90 cm LV PW:         1.20 cm LV IVS:        1.10 cm LVOT diam:     2.00 cm LV SV:         66 LV SV Index:   34 LVOT Area:     3.14 cm  RIGHT VENTRICLE RV Basal diam:  4.90 cm RV Mid diam:    3.30 cm LEFT ATRIUM             Index        RIGHT ATRIUM           Index LA diam:        4.80 cm 2.50 cm/m   RA Area:     31.00 cm LA Vol (A2C):   88.7 ml 46.26 ml/m  RA Volume:   117.00 ml 61.02 ml/m LA Vol (A4C):    83.8 ml 43.71 ml/m LA Biplane Vol: 87.1 ml 45.43 ml/m  AORTIC VALVE AV Area (Vmax):    1.41 cm AV Area (Vmean):   1.47 cm AV Area (VTI):     1.53 cm AV Vmax:           222.33 cm/s AV Vmean:          154.000 cm/s AV VTI:            0.432 m AV Peak Grad:      19.8 mmHg AV Mean Grad:      11.3 mmHg LVOT Vmax:         99.90 cm/s LVOT Vmean:        72.300 cm/s LVOT VTI:          0.210 m LVOT/AV VTI ratio: 0.49  AORTA Ao Root diam: 3.20 cm MITRAL VALVE                TRICUSPID VALVE MV Area (PHT): 3.85 cm     TR Peak grad:   30.0 mmHg MV Decel Time: 197 msec     TR Vmax:        274.00 cm/s MV E velocity: 116.00 cm/s  SHUNTS                             Systemic VTI:  0.21 m                             Systemic Diam: 2.00 cm Evalene Lunger MD Electronically signed by Evalene Lunger MD Signature Date/Time: 01/26/2024/3:05:22 PM    Final    MR BRAIN WO CONTRAST Result Date: 01/26/2024 CLINICAL DATA:  Initial evaluation for acute neuro deficit, stroke suspected. EXAM: MRI HEAD WITHOUT CONTRAST TECHNIQUE: Multiplanar, multiecho pulse sequences of the brain and surrounding structures were obtained without intravenous contrast. COMPARISON:  Comparison made with prior CTs from 01/24/2024 FINDINGS: Brain: Examination degraded by motion artifact. Cerebral volume within normal limits for age. Patchy and confluent T2/FLAIR hyperintensity involving the periventricular and deep white matter both cerebral hemispheres as well as the pons, consistent with chronic small vessel ischemic disease, moderate to advanced in nature. Few scattered superimposed remote lacunar infarcts present about the hemispheric cerebral white matter, more pronounced within the left cerebral hemisphere as compared to the right. Probable small remote cortical infarct at the high posterior left parietal lobe (series 11, image 45). No evidence for acute or subacute infarct. Gray-white matter differentiation maintained. No acute or  chronic intracranial blood products. No mass lesion, midline shift or mass effect. Mild ventricular prominence related to global parenchymal volume loss of hydrocephalus. No extra-axial fluid collection. Pituitary gland within normal limits. Vascular: Loss of normal flow void within the hypoplastic intradural right V4 segment, consistent with chronic occlusion as seen on prior CTA. Major intracranial vascular flow voids are otherwise maintained. Skull and upper cervical spine: Craniocervical junction within normal limits. Bone marrow signal intensity within normal limits. No scalp soft tissue abnormality. Sinuses/Orbits: Prior bilateral ocular lens replacement. Paranasal sinuses are largely clear. No mastoid effusion. Other: None. IMPRESSION: 1. No acute intracranial abnormality. 2. Moderate to advanced chronic microvascular ischemic disease with a few scattered remote lacunar infarcts involving the hemispheric cerebral white matter. Electronically Signed   By: Morene Hoard M.D.   On: 01/26/2024 04:27   DG Chest Port 1 View Result Date: 01/25/2024 CLINICAL DATA:  Pneumonia.  Atrial fibrillation. EXAM: PORTABLE CHEST 1 VIEW COMPARISON:  03/18/2023 FINDINGS: Cardiomegaly remains stable. Small sub pulmonic left pleural effusion is seen with mild atelectasis or infiltrate in left lung base. Right lung is grossly clear. IMPRESSION: Small sub pulmonic left pleural effusion, with mild left basilar atelectasis versus infiltrate. Recommend continued chest radiographic follow-up to confirm resolution. Stable cardiomegaly. Electronically Signed   By: Norleen DELENA Kil M.D.   On: 01/25/2024 10:13   CT ABDOMEN PELVIS WO CONTRAST Result Date: 01/25/2024 CLINICAL DATA:  Sepsis EXAM: CT ABDOMEN AND PELVIS WITHOUT CONTRAST TECHNIQUE: Multidetector CT imaging of the abdomen and pelvis was performed following the standard protocol without IV contrast. RADIATION DOSE REDUCTION: This exam was performed according to the  departmental dose-optimization program which includes automated exposure control, adjustment of the mA and/or kV according to patient size and/or use of iterative reconstruction technique. COMPARISON:  None Available. FINDINGS: Lower chest: Bibasilar atelectasis. Mild cardiomegaly and coronary artery disease. Hepatobiliary: 2 cm gallstone layering within the gallbladder. No evidence of acute cholecystitis or biliary ductal dilatation. No focal hepatic abnormality. Pancreas: No focal abnormality or ductal dilatation. Spleen: No focal abnormality.  Normal size. Adrenals/Urinary Tract: Adrenal glands are normal. Contrast fills the collecting  systems, ureters and bladder from earlier CT angio head. No hydronephrosis. No suspicious renal abnormality. Urinary bladder unremarkable. Stomach/Bowel: Normal appendix. Stomach, large and small bowel grossly unremarkable. Vascular/Lymphatic: No evidence of aneurysm or adenopathy. Aortic atherosclerosis. Reproductive: Prior hysterectomy. No adnexal masses. 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 A part of the fistula may be seen along the inferior bladder on sagittal image 68, series 6. Other: No free fluid or free air. Musculoskeletal: No acute bony abnormality. Prior right hip replacement. IMPRESSION: Contrast seen in the vagina. 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 Contrast fills the urinary bladder from earlier CTA head. Cholelithiasis.  No CT evidence of acute cholecystitis. Cardiomegaly, coronary artery disease.  Aortic atherosclerosis. Electronically Signed   By: Franky Crease M.D.   On: 01/25/2024 02:15   CT ANGIO HEAD NECK W WO CM W PERF (CODE STROKE) Result Date: 01/24/2024 CLINICAL DATA:  Neuro deficit, acute, stroke suspected EXAM: CT ANGIOGRAPHY HEAD AND NECK CT PERFUSION BRAIN TECHNIQUE: Multidetector CT imaging of the head and neck was performed using the standard protocol during bolus administration  of intravenous contrast. Multiplanar CT image reconstructions and MIPs were obtained to evaluate the vascular anatomy. Carotid stenosis measurements (when applicable) are obtained utilizing NASCET criteria, using the distal internal carotid diameter as the denominator. Multiphase CT imaging of the brain was performed following IV bolus contrast injection. Subsequent parametric perfusion maps were calculated using RAPID software. RADIATION DOSE REDUCTION: This exam was performed according to the departmental dose-optimization program which includes automated exposure control, adjustment of the mA and/or kV according to patient size and/or use of iterative reconstruction technique. CONTRAST:  OMNIPAQUE  IOHEXOL  350 MG/ML SOLN COMPARISON:  CTA head neck 01/29/2023. FINDINGS: CTA NECK FINDINGS Limited evaluation due to poor contrast bolus timing. Within this limitation: Aortic arch: Great vessel origins are patent without significant stenosis Right carotid system: Atherosclerosis at carotid bifurcation with approximately 80% stenosis. Left carotid system: Carotid stent in place which traverses the carotid bifurcation and is patent. Vertebral arteries: Left dominant. Similar occlusion of the distal right vertebral artery with poor opacification proximally. Left vertebral artery is patent with similar moderate origin stenosis. Skeleton: No acute abnormality on limited assessment. Other neck: No acute abnormality on limited assessment. Upper chest: No acute abnormality on limited assessment. Review of the MIP images confirms the above findings CTA HEAD FINDINGS Limited evaluation due to poor contrast bolus timing. Within this limitation: Anterior circulation: Atherosclerosis of bilateral intracranial ICAs. The intracranial ICAs and proximal MCAs are patent. The proximal ACAs are patent bilaterally. Limited distal MCA and ACA evaluation due to poor contrast bolus timing and motion. Posterior circulation: Similar severe  atherosclerotic narrowing of the left intradural vertebral artery proximally. Poorly opacified right intradural vertebral artery, chronic. The basilar artery and bilateral posterior arteries are patent. Similar small right P1 PCA with bilateral P2 PCA stenoses. Mid to distal PCAs are poorly evaluated due to contrast bolus timing. Venous sinuses: Not well evaluated. Review of the MIP images confirms the above findings CT Brain Perfusion Findings: ASPECTS: 10. CBF (<30%) Volume: 0mL Perfusion (Tmax>6.0s) volume: 37mL Mismatch Volume: 37mL Infarction Location:No core infarct identified. IMPRESSION: CTA: 1. Limited study without evidence of proximal large vessel occlusion. 2. 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 3. Interval stenting of the left carotid bifurcation/proximal ICA with patent stent. 4. Similar bilateral P2 PCA stenosis. 5. 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 6. Similar moderate left vertebral artery  origin stenosis. CT perfusion: 1. Approximately 37 mL of reported penumbra, predominantly in the right cerebral hemisphere/ICA territory and potentially due to underlying right ICA stenosis and/or artifact. The degree of penumbra may be overestimated given likely artifactual mismatch in the inferior cerebellum. 2. No evidence of core infarct. An MRI could provide more sensitive evaluation for acute infarct if clinically warranted. Electronically Signed   By: Gilmore GORMAN Molt M.D.   On: 01/24/2024 21:42   CT HEAD CODE STROKE WO CONTRAST Result Date: 01/24/2024 CLINICAL DATA:  Code stroke.  Neuro deficit, acute, stroke suspected EXAM: CT HEAD WITHOUT CONTRAST TECHNIQUE: Contiguous axial images were obtained from the base of the skull through the vertex without intravenous contrast. RADIATION DOSE REDUCTION: This exam was performed according to the departmental dose-optimization program which includes automated exposure control, adjustment of the mA  and/or kV according to patient size and/or use of iterative reconstruction technique. COMPARISON:  None Available. FINDINGS: Motion limited study.  Within this limitation Brain: No evidence of acute large vascular territory infarct acute hemorrhage mass lesion, midline shift or hydrocephalus. Advanced patchy and white matter hypodensities compatible with microvascular ischemic change. Vascular: No hyperdense vessel or unexpected calcification. Skull: Normal. Negative for fracture or focal lesion. Sinuses/Orbits: No acute finding. ASPECTS [facility] Stroke Program Early CT Score) Total score (0-10 with 10 being normal): 10. IMPRESSION: 1. Motion limited study without evidence of acute large vascular territory infarct or acute hemorrhage. 2. Advanced microvascular ischemic disease. MRI could provide more sensitive evaluation for acute infarct if clinically warranted. Electronically Signed   By: Gilmore GORMAN Molt M.D.   On: 01/24/2024 21:13      Discharge Exam: Vitals:   01/27/24 1200 01/27/24 1300  BP: (!) 151/50 (!) 154/113  Pulse: 76 74  Resp: 19 (!) 21  Temp:    SpO2: 99% 98%   Vitals:   01/27/24 1000 01/27/24 1030 01/27/24 1200 01/27/24 1300  BP: (!) 172/95  (!) 151/50 (!) 154/113  Pulse: 79 81 76 74  Resp: 18 (!) 25 19 (!) 21  Temp:      TempSrc:      SpO2: 97% 97% 99% 98%  Weight:      Height:        Constitutional:  Normal appearance. Non toxic-appearing.  HENT: Head Normocephalic and atraumatic.  Mucous membranes are moist.  Eyes:  Extraocular intact. Conjunctivae normal.  Cardiovascular: Rate and Rhythm: Normal rate and regular rhythm.  Pulmonary: Non labored, symmetric rise of chest wall.  Skin: warm and dry. not jaundiced.  Neurological: No focal deficit present. alert. Oriented.  Psychiatric: Mood and Affect congruent.    The results of significant diagnostics from this hospitalization (including imaging, microbiology, ancillary and laboratory) are listed below for  reference.     Microbiology: Recent Results (from the past 240 hours)  MRSA Next Gen by PCR, Nasal     Status: None   Collection Time: 01/25/24  1:36 AM   Specimen: Nasal Mucosa; Nasal Swab  Result Value Ref Range Status   MRSA by PCR Next Gen NOT DETECTED NOT DETECTED Final    Comment: (NOTE) The GeneXpert MRSA Assay (FDA approved for NASAL specimens only), is one component of a comprehensive MRSA colonization surveillance program. It is not intended to diagnose MRSA infection nor to guide or monitor treatment for MRSA infections. Test performance is not FDA approved in patients less than 8 years old. Performed at [facility], [address] [place], [place] [address]  Labs: BNP (last 3 results) No results for input(s): BNP in the last 8760 hours. Basic Metabolic Panel: Recent Labs  Lab 01/24/24 2107 01/26/24 0530 01/27/24 0357  NA 139 141 139  K 3.8 4.3 3.6  CL 98 101 102  CO2 28 27 27   GLUCOSE 148* 120* 125*  BUN 24* 24* 20  CREATININE 0.97 1.17* 0.85  CALCIUM  10.0 10.1 9.5  MG  --  1.9 1.7  PHOS  --  3.3 2.7   Liver Function Tests: Recent Labs  Lab 01/24/24 2107  AST 33  ALT 22  ALKPHOS 85  BILITOT 0.6  PROT 7.5  ALBUMIN 4.0   No results for input(s): LIPASE, AMYLASE in the last 168 hours. No results for input(s): AMMONIA in the last 168 hours. CBC: Recent Labs  Lab 01/24/24 2107 01/26/24 0530 01/27/24 0357  WBC 7.5 9.4 9.0  NEUTROABS 4.8  --   --   HGB 12.5 11.8* 11.1*  HCT 37.6 35.7* 33.3*  MCV 105.0* 105.9* 104.4*  PLT 206 168 164   Cardiac Enzymes: No results for input(s): CKTOTAL, CKMB, CKMBINDEX, TROPONINI in the last 168 hours. BNP: Invalid input(s): POCBNP CBG: Recent Labs  Lab 01/25/24 0121  GLUCAP 208*   D-Dimer No results for input(s): DDIMER in the last 72 hours. Hgb A1c Recent Labs    01/24/24 2107  HGBA1C 5.5   Lipid Profile Recent Labs    01/25/24 0556  CHOL 134  HDL 60   LDLCALC 61  TRIG 67  CHOLHDL 2.2   Thyroid function studies Recent Labs    01/24/24 2107  TSH 2.671   Anemia work up No results for input(s): VITAMINB12, FOLATE, FERRITIN, TIBC, IRON, RETICCTPCT in the last 72 hours. Urinalysis    Component Value Date/Time   COLORURINE YELLOW (A) 01/25/2024 1436   APPEARANCEUR CLEAR (A) 01/25/2024 1436   LABSPEC 1.034 (H) 01/25/2024 1436   PHURINE 5.0 01/25/2024 1436   GLUCOSEU NEGATIVE 01/25/2024 1436   HGBUR SMALL (A) 01/25/2024 1436   BILIRUBINUR NEGATIVE 01/25/2024 1436   KETONESUR NEGATIVE 01/25/2024 1436   PROTEINUR 30 (A) 01/25/2024 1436   NITRITE NEGATIVE 01/25/2024 1436   LEUKOCYTESUR MODERATE (A) 01/25/2024 1436   Sepsis Labs Recent Labs  Lab 01/24/24 2107 01/26/24 0530 01/27/24 0357  WBC 7.5 9.4 9.0   Microbiology Recent Results (from the past 240 hours)  MRSA Next Gen by PCR, Nasal     Status: None   Collection Time: 01/25/24  1:36 AM   Specimen: Nasal Mucosa; Nasal Swab  Result Value Ref Range Status   MRSA by PCR Next Gen NOT DETECTED NOT DETECTED Final    Comment: (NOTE) The GeneXpert MRSA Assay (FDA approved for NASAL specimens only), is one component of a comprehensive MRSA colonization surveillance program. It is not intended to diagnose MRSA infection nor to guide or monitor treatment for MRSA infections. Test performance is not FDA approved in patients less than 53 years old. Performed at [facility], [address], [place], [place] [address]      Time coordinating discharge: 32 min   SIGNED: Lorane Poland, MD  [facility] 01/27/2024, 1:11 PM Pager   If 7PM-7AM, please contact night-coverage

## 2024-01-27 NOTE — Discharge Instructions (Signed)
 HH arranged with [facility].  They will start services within 48 hours of discharge.  They will call you to set up a time to make the first visit.

## 2024-01-27 NOTE — Progress Notes (Signed)
 Speech Language Pathology Treatment: Dysphagia;Cognitive-Linguistic  Patient Details Name: Brooke Davis MRN: [id number] DOB: 1941/04/27 Today's Date: 01/27/2024 Time: 8997-8975 SLP Time Calculation (min) (ACUTE ONLY): 22 min  Assessment / Plan / Recommendation Clinical Impression  Pt seen for f/u tx for dysphagia and cognitive-communication deficits. Daughters at bedside. Pt alert, pleasant, and cooperative. Joking with family. Much brighter affect this date.   Per pt and daughters, pt tolerating mech soft diet without overt s/sx pharyngeal dysphagia. Pt reports improved comfort with mastication with softer consistencies. Reviewed diet recommendations/rationale with pt and daughters.   Pt continues with s/sx mild cognitive-communication deficits. ?full extent of baseline level of functioning. Daughters noted pt does not drive and has assistance with medication management from daughters. Pt resides with grandson with daughters checking in daily. Today, pt without s/sx dysarthria during informal conversational exchanges. Daughters note intermittently articulatory imprecision which was not appreciated by SLP this date. Pt continues with some mild confusion. Suspect pt will perform better cognitively in her own environment.   Recommend post-acute SLP services for cognitive-linguistic evaluation and tx, as indicated, in a home/functional environment. SLP to sign off at this level of care.     HPI HPI: Per HPI 83 year old female with history of hypertension, diabetes, multiple prior strokes, A-fib not on anticoagulation presenting to the emergency department for evaluation of aphasia as a code stroke. S/p tnk, no LVO.      SLP Plan  Discharge SLP treatment due to (comment) (no acute ST needs; needs can be met at next level of care)          Recommendations  Diet recommendations: Dysphagia 3 (mechanical soft);Thin liquid Liquids provided via: Teaspoon;Cup;Straw Medication  Administration:  (as tolerated) Supervision: Patient able to self feed Compensations: Minimize environmental distractions;Slow rate;Small sips/bites;Follow solids with liquid Postural Changes and/or Swallow Maneuvers: Seated upright 90 degrees;Upright 30-60 min after meal                  Oral care QID;Staff/trained caregiver to provide oral care   Frequent or constant Supervision/Assistance Dysphagia, oral phase (R13.11);Cognitive communication deficit (R41.841)     Discharge SLP treatment due to (comment) (no acute ST needs; needs can be met at next level of care)   [name], M.S., CCC-SLP Speech-Language Pathologist [facility] [phone] [place])   Delon CHRISTELLA Bangs  01/27/2024, 10:39 AM

## 2024-01-27 NOTE — TOC Initial Note (Signed)
 Transition of Care [facility]) - Initial/Assessment Note    Patient Details  Name: Brooke Davis MRN: [id number] Date of Birth: 04-09-1941  Transition of Care [facility]) CM/SW Contact:    Brooke Davis A Brooke Romanek, RN Phone Number: 01/27/2024, 12:55 PM  Clinical Narrative:                 Chart reviewed.  Noted that patient was admitted with Acute CVA. Patient received TNK and was admitted to ICU.  Patient has a previous history of Davis CVA.    I have meet with patient at bedside.  She informs me that Davis to admission she lived at home with her grandson and his children.  She reports that Davis to admission she was able bath and dress herself.  She was able to get around with a walker.  Brooke Davis informs me that her daughter Brooke Davis takes her to medical appointments.  She informs me that medications are affordable. She uses [employer].    I have informed Brooke Davis that PT has recommended home health PT on discharge.  I have provided Brooke Davis a home health listing.  She did not have a preference.  I have asked  Brooke Davis with [facility] to accept home care referral for home health PT/OT.    TOC will continue to follow for discharge planning.    Expected Discharge Plan: Home w Home Health Services Barriers to Discharge: Continued Medical Work up   Patient Goals and CMS Choice   CMS Medicare.gov Compare Post Acute Care list provided to:: Patient Choice offered to / list presented to : Patient      Expected Discharge Plan and Services   Discharge Planning Services: CM Consult Post Acute Care Choice: Home Health Living arrangements for the past 2 months: Single Family Home                           HH Arranged: PT, OT HH Agency: [facility] ([facility]) Date HH Agency Contacted: 01/27/24   Representative spoke with at [facility] Agency: Brooke Davis  Davis Living Arrangements/Services Living arrangements for the past 2 months: Single Family Home Lives with::  (Lives with grandson  and his children) Patient language and need for interpreter reviewed:: Yes Do you feel safe going back to the place where you live?: Yes        Care giver support system in place?: Yes (comment) (Patient has a supportive daughter) Current home services: DME (Patient has a rolling walker and seat in the shower at home.)    Activities of Daily Living   ADL Screening (condition at time of admission) Independently performs ADLs?: Yes (appropriate for developmental age) Is the patient deaf or have difficulty hearing?: No Does the patient have difficulty seeing, even when wearing glasses/contacts?: No Does the patient have difficulty concentrating, remembering, or making decisions?: Yes  Permission Sought/Granted                  Emotional Assessment Appearance:: Appears stated age Attitude/Demeanor/Rapport: Engaged Affect (typically observed): Appropriate Orientation: : Oriented to Self, Oriented to Place, Oriented to  Time, Oriented to Situation      Admission diagnosis:  Aphasia [R47.01] Facial droop [R29.810] Acute CVA (cerebrovascular accident) [facility]) [I63.9] Patient Active Problem List   Diagnosis Date Noted   Acute CVA (cerebrovascular accident) (HCC) 01/24/2024   PCP:  Valora Agent, MD Pharmacy:   [facility] [phone] - [place], [place] - [address] AT  [address] [place] [address] Phone: [phone] Fax: [phone]  [facility] [id number] [place], [place] - [address] [place] [address] Phone: [phone] Fax: [phone]     Social Drivers of Health (SDOH) Social History: SDOH Screenings   Food Insecurity: Patient Unable To Answer (01/25/2024)  Housing: Unknown (01/25/2024)  Transportation Needs: Patient Unable To Answer (01/25/2024)  Utilities: Patient Unable To Answer (01/25/2024)  Social Connections: Patient Unable To Answer (01/25/2024)   SDOH Interventions:      Readmission Risk Interventions     No data to display

## 2024-01-27 NOTE — Consult Note (Signed)
 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   Clinical Assessment and Goals of Care: Notes, diagnostics and labs reviewed.  And to see patient.  She is currently resting in bed at this time with daughters at bedside.  Daughters are healthcare power of attorney if patient becomes unable to make decisions for herself.  They discuss the events that led to calling 911, the symptoms the patient was having, and are grateful for the TNK treatment.  Patient voices being grateful for her daughter making the decision to provide this medication in spite of risks.  She is clear that she wants eating care that would be life-prolonging at this time.  She discusses a cough prior to this admission.  She discusses fever early in this admission.  We discussed labs, diagnostics, and care that has been provided.  She discusses concern for pneumonia.  Attending messaged upon departure from room.  I-S ordered.    SUMMARY OF RECOMMENDATIONS   Continue current care.  Patient hopeful for discharge soon.       Primary Diagnoses: Present on Admission:  Acute CVA (cerebrovascular accident) (HCC)   I have reviewed the medical record,  interviewed the patient and family, and examined the patient. The following aspects are pertinent.  History reviewed. No pertinent past medical history. Social History   Socioeconomic History   Marital status: Single    Spouse name: Not on file   Number of children: Not on file   Years of education: Not on file   Highest education level: Not on file  Occupational History   Not on file  Tobacco Use   Smoking status: Unknown   Smokeless tobacco: Not on file  Substance and Sexual Activity   Alcohol use: Not on file   Drug use: Not on file   Sexual activity: Not on file  Other Topics Concern   Not on file  Social History Narrative   Not on file   Social Drivers of Health   Financial Resource Strain: Not on file  Food Insecurity: Patient Unable To Answer (01/25/2024)   Hunger Vital Sign    Worried About Running Out of Food in the Last Year: Patient unable to answer    Ran Out of Food in the Last Year: Patient unable to answer  Transportation Needs: Patient Unable To Answer (01/25/2024)   PRAPARE - Transportation  Lack of Transportation (Medical): Patient unable to answer    Lack of Transportation (Non-Medical): Patient unable to answer  Physical Activity: Not on file  Stress: Not on file  Social Connections: Patient Unable To Answer (01/25/2024)   Social Connection and Isolation Panel    Frequency of Communication with Friends and Family: Patient unable to answer    Frequency of Social Gatherings with Friends and Family: Patient unable to answer    Attends Religious Services: Patient unable to answer    Active Member of Clubs or Organizations: Patient unable to answer    Attends Banker Meetings: Patient unable to answer    Marital Status: Patient unable to answer   History reviewed. No pertinent family history. Scheduled Meds:  allopurinol   300 mg Oral Daily   apixaban   5 mg Oral BID   [START ON 01/28/2024] aspirin   81 mg Oral Daily   atorvastatin   40 mg Oral  QHS   Chlorhexidine  Gluconate Cloth  6 each Topical Daily   citalopram   20 mg Oral Daily   losartan   50 mg Oral Daily   metoprolol  tartrate  25 mg Oral BID   pantoprazole   40 mg Oral QHS   sodium chloride  flush  3 mL Intravenous Once   Continuous Infusions: PRN Meds:.acetaminophen  **OR** acetaminophen  (TYLENOL ) oral liquid 160 mg/5 mL **OR** acetaminophen , hydrALAZINE , labetalol , morphine  injection, mouth rinse, senna-docusate, sodium chloride  Medications Prior to Admission:  Prior to Admission medications   Not on File   No Known Allergies Review of Systems  All other systems reviewed and are negative.   Physical Exam Pulmonary:     Effort: Pulmonary effort is normal.  Neurological:     Mental Status: She is alert.     Vital Signs: BP (!) 172/95   Pulse 81   Temp 98.4 F (36.9 C) (Oral)   Resp (!) 25   Ht 5' 5 (1.651 m)   Wt 84.3 kg   SpO2 97%   BMI 30.93 kg/m  Pain Scale: 0-10   Pain Score: Asleep   SpO2: SpO2: 97 % O2 Device:SpO2: 97 % O2 Flow Rate: .O2 Flow Rate (L/min): 2 L/min  IO: Intake/output summary:  Intake/Output Summary (Last 24 hours) at 01/27/2024 1233 Last data filed at 01/27/2024 9470 Gross per 24 hour  Intake 120 ml  Output 1125 ml  Net -1005 ml    LBM: Last BM Date : 01/25/24 Baseline Weight: Weight: 92.3 kg Most recent weight: Weight: 84.3 kg     Palliative Assessment/Data:  Signed by: Camelia Lewis, NP   Please contact Palliative Medicine Team phone at [phone] for questions and concerns.  For individual provider: See Tracey

## 2024-01-27 NOTE — Telephone Encounter (Signed)
 Patient Product/process development scientist completed.    The patient is insured through [facility]. Patient has Medicare and is not eligible for a copay card, but may be able to apply for patient assistance or Medicare RX Payment Plan (Patient Must reach out to their plan, if eligible for payment plan), if available.    Ran test claim for Eliquis 5 mg and the current 30 day co-pay is $12.15.   This test claim was processed through [facility]- copay amounts may vary at other pharmacies due to pharmacy/plan contracts, or as the patient moves through the different stages of their insurance plan.     Roland Earl, CPHT Pharmacy Technician III Certified Patient Advocate [facility] Patient Advocate Team Direct Number: [phone]  Fax: [phone]

## 2024-02-09 ENCOUNTER — Ambulatory Visit (INDEPENDENT_AMBULATORY_CARE_PROVIDER_SITE_OTHER)

## 2024-02-09 DIAGNOSIS — I6523 Occlusion and stenosis of bilateral carotid arteries: Secondary | ICD-10-CM | POA: Diagnosis not present

## 2024-02-16 ENCOUNTER — Ambulatory Visit (INDEPENDENT_AMBULATORY_CARE_PROVIDER_SITE_OTHER): Admitting: Nurse Practitioner

## 2024-02-16 ENCOUNTER — Encounter (INDEPENDENT_AMBULATORY_CARE_PROVIDER_SITE_OTHER)

## 2024-02-22 ENCOUNTER — Ambulatory Visit (INDEPENDENT_AMBULATORY_CARE_PROVIDER_SITE_OTHER): Admitting: Vascular Surgery

## 2024-02-22 ENCOUNTER — Encounter (INDEPENDENT_AMBULATORY_CARE_PROVIDER_SITE_OTHER): Payer: Self-pay | Admitting: Vascular Surgery

## 2024-02-22 VITALS — BP 129/78 | HR 71 | Resp 16 | Ht 62.0 in | Wt 189.0 lb

## 2024-02-22 DIAGNOSIS — E1165 Type 2 diabetes mellitus with hyperglycemia: Secondary | ICD-10-CM

## 2024-02-22 DIAGNOSIS — I6523 Occlusion and stenosis of bilateral carotid arteries: Secondary | ICD-10-CM | POA: Diagnosis not present

## 2024-02-22 DIAGNOSIS — I482 Chronic atrial fibrillation, unspecified: Secondary | ICD-10-CM | POA: Diagnosis not present

## 2024-02-22 DIAGNOSIS — I10 Essential (primary) hypertension: Secondary | ICD-10-CM

## 2024-02-22 DIAGNOSIS — I63412 Cerebral infarction due to embolism of left middle cerebral artery: Secondary | ICD-10-CM

## 2024-02-22 DIAGNOSIS — E785 Hyperlipidemia, unspecified: Secondary | ICD-10-CM

## 2024-02-22 NOTE — H&P (View-Only) (Signed)
 MRN : [id number]  Brooke Davis is a 83 y.o. (04-Aug-1940) female who presents with chief complaint of  Chief Complaint  Patient presents with   Follow-up    6 month follow up Carotid   .  History of Present Illness: Patient returns in follow-up of her carotid disease.  About a month ago, she had a stroke requiring thrombolytic therapy.  This presented with left arm weakness and numbness as well as speech difficulty.  She is left-hand dominant so the speech could certainly be related to a right hemispheric stroke.  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  She did well from this.  She had a CT angiogram as part of her workup for this recent stroke that I have independently reviewed.  This was interpreted as an 80% right ICA stenosis in the cervical portion.  A recent duplex did not show significant elevation in velocities in the right ICA that would be concerning for high-grade stenosis, but calcification may have impaired visualization of this area.  Her left carotid stent was widely patent both on CT angiogram and duplex.  Since her stroke, she has had near complete recovery.  She feels very close to her baseline.  She is here today to discuss ongoing evaluation and treatment.  She is on Lipitor , Plavix , and aspirin .  Current Outpatient Medications  Medication Sig Dispense Refill   acetaminophen  (TYLENOL ) 325 MG tablet Take 2 tablets (650 mg total) by mouth every 6 (six) hours as needed for mild pain (or Fever >/= 101).     acetaminophen  (TYLENOL ) 325 MG tablet Take 650 mg by mouth every 6 (six) hours as needed for mild pain (pain score 1-3), fever or headache.     allopurinol  (ZYLOPRIM ) 300 MG tablet Take 1 tablet (300 mg total) by mouth daily. 30 tablet 0   allopurinol  (ZYLOPRIM ) 300 MG tablet Take 300 mg by mouth daily.     ALPRAZolam  (XANAX ) 0.25 MG tablet Take 1 tablet (0.25 mg total)  by mouth 2 (two) times daily as needed for anxiety. 5 tablet 0   ALPRAZolam  (XANAX ) 0.25 MG tablet Take 0.25 mg by mouth 2 (two) times daily as needed for anxiety.     apixaban  (ELIQUIS ) 5 MG TABS tablet Take 1 tablet (5 mg total) by mouth 2 (two) times daily. 180 tablet 0   aspirin  81 MG chewable tablet Chew 1 tablet (81 mg total) by mouth daily. 90 tablet 0   atorvastatin  (LIPITOR ) 80 MG tablet Take 1 tablet (80 mg total) by mouth daily. 30 tablet 11   atorvastatin  (LIPITOR ) 80 MG tablet Take 80 mg by mouth daily.     ciprofloxacin  (CIPRO ) 500 MG tablet Take 1 tablet (500 mg total) by mouth 2 (two) times daily. 60 tablet 1   citalopram  (CELEXA ) 20 MG tablet Take 1 tablet (20 mg total) by mouth daily. 30 tablet 0   citalopram  (CELEXA ) 20 MG tablet Take 20 mg by mouth daily.     clopidogrel  (PLAVIX ) 75 MG tablet TAKE 1 TABLET(75 MG) BY MOUTH DAILY 90 tablet 1   losartan  (COZAAR ) 50 MG tablet Take 1 tablet (50 mg total) by mouth daily for 25 days. 30 tablet 0   melatonin 5 MG TABS Take 1 tablet (5 mg total) by mouth at bedtime. 30 tablet 0   metoprolol  tartrate (LOPRESSOR ) 25 MG tablet Take 1 tablet (25 mg total) by mouth  2 (two) times daily. 60 tablet 0   metoprolol  tartrate (LOPRESSOR ) 25 MG tablet Take 25 mg by mouth 2 (two) times daily.     nystatin  (MYCOSTATIN /NYSTOP ) powder Apply 1 Application topically 3 (three) times daily. 15 g 0   potassium chloride  (KLOR-CON ) 10 MEQ tablet Take 1 tablet (10 mEq total) by mouth daily. 30 tablet 0   potassium chloride  (KLOR-CON ) 10 MEQ tablet Take 10 mEq by mouth daily.     torsemide  (DEMADEX ) 20 MG tablet Take 1 tablet (20 mg total) by mouth daily. 30 tablet 0   torsemide  (DEMADEX ) 20 MG tablet Take 20 mg by mouth daily.     traMADol  (ULTRAM ) 50 MG tablet Take 50 mg by mouth every 4 (four) hours as needed.     traMADol  (ULTRAM ) 50 MG tablet Take 50 mg by mouth every 4 (four) hours as needed for moderate pain (pain score 4-6).     No current  facility-administered medications for this visit.    Past Medical History:  Diagnosis Date   Anxiety    a.) on BZO (alprazolam ) PRN   Arthritis    fingers, right hip   Atrial fibrillation (HCC) 11/10/2013   a.) CHA2DS2VASc = 5 (age x2, sex, HTN, T2DM);  b.) rate/rhythm maintained on oral metoprolol  tartrate; chronically anticoagulated with apixaban    Bronchitis    Cerebrovascular small vessel disease (chronic)    Coronary artery disease involving native coronary artery of native heart 01/11/2021   a.) LHC 01/11/2021: 40% oLM, 50% mLCx, 30% oLCx, 30% pLAD, 30% mLAD, 99% pRCA (2.75 x 30 mm Resolute Onyx DES), 75% p-mRCA, 60% dRCA   DDD (degenerative disc disease), lumbar 12/01/2013   Diastolic dysfunction 01/12/2021   a.) TTE 01/12/2021 (s/p inf STEMI): EF 55-60%, mild-mod LAE, mild RAE, mod RVE, mild AR/MR, mod TR, G1DD   Diet-controlled type 2 diabetes mellitus (HCC)    Full dentures    Gout    History of bilateral cataract extraction 2021   Hyperlipidemia    Hypertension, essential    Long term current use of anticoagulant    a.) apixaban    PONV (postoperative nausea and vomiting)    Sinoatrial node dysfunction (HCC) 11/10/2013   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) 01/11/2021   a.) LHC/PCI 01/11/2021 --> culprit lesion 99% pRCA --> 2.75 x 30 mm Resolute Onyx DES x 1   Varicose veins of legs    Wears hearing aid in both ears     Past Surgical History:  Procedure Laterality Date   BREAST BIOPSY Left 2011   NEG   CARDIAC CATHETERIZATION     over 20 yrs ago.  All OK.   CAROTID PTA/STENT INTERVENTION Left 02/25/2023   Procedure: CAROTID PTA/STENT INTERVENTION;  Surgeon: Marea Selinda RAMAN, MD;  Location: [facility];  Service: Cardiovascular;  Laterality: Left;   CATARACT EXTRACTION W/PHACO Left 03/27/2020   Procedure: CATARACT EXTRACTION PHACO AND INTRAOCULAR LENS PLACEMENT (IOC) LEFT 7.49  00:56.4  13.3%;  Surgeon: Mittie Gaskin, MD;   Location: [facility];  Service: Ophthalmology;  Laterality: Left;   CATARACT EXTRACTION W/PHACO Right 04/24/2020   Procedure: CATARACT EXTRACTION PHACO AND INTRAOCULAR LENS PLACEMENT (IOC) RIGHT;  Surgeon: Mittie Gaskin, MD;  Location: [facility];  Service: Ophthalmology;  Laterality: Right;  8.34 0:50.2 16.6%   CORONARY/GRAFT ACUTE MI REVASCULARIZATION N/A 01/11/2021   Procedure: Coronary/Graft Acute MI Revascularization;  Surgeon: Ammon Blunt, MD;  Location: [facility];  Service: Cardiovascular;  Laterality:  N/A;   INCISION AND DRAINAGE Right 03/21/2023   Procedure: RIGHT HIP IRRIGATION AND DEBRIDEMENT OF INFECTED TOTAL JOINT, EXCHANGE OF HEAD AND POLY COMPONENTS WITH PLACEMENT OF ANTIBIOTIC BEADS;  Surgeon: Zafonte, Brian Thomas, MD;  Location: [facility];  Service: Orthopedics;  Laterality: Right;   LEFT HEART CATH AND CORONARY ANGIOGRAPHY N/A 01/11/2021   Procedure: LEFT HEART CATH AND CORONARY ANGIOGRAPHY;  Surgeon: Ammon Blunt, MD;  Location: [facility];  Service: Cardiovascular;  Laterality: N/A;   PARTIAL HYSTERECTOMY     TOE SURGERY Left    great toe; rod in place   TOTAL HIP ARTHROPLASTY Right 01/18/2023   Procedure: TOTAL HIP ARTHROPLASTY;  Surgeon: Mardee Lynwood SQUIBB, MD;  Location: [facility];  Service: Orthopedics;  Laterality: Right;   TOTAL KNEE ARTHROPLASTY Right 2007     Social History   Tobacco Use   Smoking status: Former   Smokeless tobacco: Never  Vaping Use   Vaping status: Never Used  Substance Use Topics   Alcohol use: Not Currently   Drug use: Never       Family History  Problem Relation Age of Onset   Heart attack Mother 57       required open heart surgery. Onset age is unknown.   Stroke Father    Diabetes Father    Alcoholism Brother    Breast cancer Neg Hx      Allergies  Allergen Reactions   Celebrex [Celecoxib] Diarrhea   Penicillins Itching    IgE = 154 (WNL) on 01/05/2023   Relafen  [Nabumetone]     Palpitation    Sulfa Antibiotics    Chlorhexidine  Rash   Lovastatin Diarrhea   Tape Rash     REVIEW OF SYSTEMS (Negative unless checked)   Constitutional: [] Weight loss  [] Fever  [] Chills Cardiac: [] Chest pain   [] Chest pressure   [x] Palpitations   [] Shortness of breath when laying flat   [] Shortness of breath at rest   [x] Shortness of breath with exertion. Vascular:  [] Pain in legs with walking   [] Pain in legs at rest   [] Pain in legs when laying flat   [] Claudication   [] Pain in feet when walking  [] Pain in feet at rest  [] Pain in feet when laying flat   [] History of DVT   [] Phlebitis   [] Swelling in legs   [] Varicose veins   [] Non-healing ulcers Pulmonary:   [] Uses home oxygen   [] Productive cough   [] Hemoptysis   [] Wheeze  [] COPD   [] Asthma Neurologic:  [] Dizziness  [] Blackouts   [] Seizures   [x] History of stroke   [] History of TIA  [] Aphasia   [] Temporary blindness   [] Dysphagia   [] Weakness or numbness in arms   [] Weakness or numbness in legs Musculoskeletal:  [x] Arthritis   [] Joint swelling   [x] Joint pain   [] Low back pain Hematologic:  [] Easy bruising  [] Easy bleeding   [] Hypercoagulable state   [] Anemic   Gastrointestinal:  [] Blood in stool   [] Vomiting blood  [x] Gastroesophageal reflux/heartburn   [] Abdominal pain Genitourinary:  [] Chronic kidney disease   [] Difficult urination  [] Frequent urination  [] Burning with urination   [] Hematuria Skin:  [] Rashes   [] Ulcers   [] Wounds Psychological:  [x] History of anxiety   []  History of major depression.  Physical Examination  Vitals:   02/22/24 1149  BP: 129/78  Pulse: 71  Resp: 16  Weight: 189 lb (85.7 kg)  Height: 5' 2 (1.575 m)   Body mass index is 34.57 kg/m. Gen:  WD/WN,  NAD Head: [place]/AT, No temporalis wasting. Ear/Nose/Throat: Hearing grossly intact, nares w/o erythema or drainage, trachea midline Eyes: Conjunctiva clear. Sclera non-icteric Neck: Supple.  Right carotid bruit  Pulmonary:  Good air  movement, equal and clear to auscultation bilaterally.  Cardiac: RRR, No JVD Vascular:  Vessel Right Left  Radial Palpable Palpable       Musculoskeletal: M/S 5/5 throughout.  No deformity or atrophy. Mild LE edema. Neurologic: CN 2-12 intact. Sensation grossly intact in extremities.  Symmetrical.  Speech is fluent. Motor exam as listed above. Psychiatric: Judgment intact, Mood & affect appropriate for pt's clinical situation. Dermatologic: No rashes or ulcers noted.  No cellulitis or open wounds.     CBC Lab Results  Component Value Date   WBC 9.0 01/27/2024   HGB 11.1 (L) 01/27/2024   HCT 33.3 (L) 01/27/2024   MCV 104.4 (H) 01/27/2024   PLT 164 01/27/2024    BMET    Component Value Date/Time   NA 139 01/27/2024 0357   K 3.6 01/27/2024 0357   CL 102 01/27/2024 0357   CO2 27 01/27/2024 0357   GLUCOSE 125 (H) 01/27/2024 0357   BUN 20 01/27/2024 0357   CREATININE 0.85 01/27/2024 0357   CALCIUM  9.5 01/27/2024 0357   GFRNONAA >60 01/27/2024 0357   CrCl cannot be calculated (Patient's most recent lab result is older than the maximum 21 days allowed.).  COAG Lab Results  Component Value Date   INR 1.1 01/24/2024   INR 2.2 (H) 03/18/2023   INR 1.4 (H) 07/14/2021    Radiology VAS US  CAROTID Result Date: 02/14/2024 Carotid Arterial Duplex Study Patient Name:  CHONTE RICKE [facility]  Date of Exam:   02/09/2024 Medical Rec #: [id number]         Accession #:    [id number] Date of Birth: 1940/07/22         Patient Gender: F Patient Age:   85 years Exam Location:  [facility] Procedure:      VAS US  CAROTID Referring Phys: SELINDA GU --------------------------------------------------------------------------------  Indications:   CVA, Carotid artery disease and Left stent. Risk Factors:  Prior MI. Other Factors: 02/25/2023: Placement of a 9 mm proximal, 7 mm distal 4 cm long                Exact stent with the use of the NAV-6 embolic protection device                in the Left  Carotid Artery. Performing Technologist: Donnice Charnley RVT  Examination Guidelines: A complete evaluation includes B-mode imaging, spectral Doppler, color Doppler, and power Doppler as needed of all accessible portions of each vessel. Bilateral testing is considered an integral part of a complete examination. Limited examinations for reoccurring indications may be performed as noted.  Right Carotid Findings: +----------+--------+--------+--------+----------------------+--------+           PSV cm/sEDV cm/sStenosisPlaque Description    Comments +----------+--------+--------+--------+----------------------+--------+ CCA Prox  111     18                                             +----------+--------+--------+--------+----------------------+--------+ CCA Mid   93      16      <50%    irregular and calcific         +----------+--------+--------+--------+----------------------+--------+ CCA Distal81      16                                             +----------+--------+--------+--------+----------------------+--------+  ICA Prox  129     35      1-39%   irregular and calcific         +----------+--------+--------+--------+----------------------+--------+ ICA Mid   99      22                                             +----------+--------+--------+--------+----------------------+--------+ ICA Distal115     26                                             +----------+--------+--------+--------+----------------------+--------+ ECA       132     10              irregular and calcific         +----------+--------+--------+--------+----------------------+--------+ +----------+--------+-------+----------------+-------------------+           PSV cm/sEDV cmsDescribe        Arm Pressure (mmHG) +----------+--------+-------+----------------+-------------------+ [id number]     0      Multiphasic, WNL                     +----------+--------+-------+----------------+-------------------+ +---------+--------+--+--------+---------+ VertebralPSV cm/s22EDV cm/sAntegrade +---------+--------+--+--------+---------+  Left Carotid Findings: +----------+--------+--------+--------+----------------------+-------------+           PSV cm/sEDV cm/sStenosisPlaque Description    Comments      +----------+--------+--------+--------+----------------------+-------------+ CCA Prox  76      15      <50%    irregular and calcific              +----------+--------+--------+--------+----------------------+-------------+ CCA Mid   78      18                                                  +----------+--------+--------+--------+----------------------+-------------+ CCA Distal63      13                                    stent         +----------+--------+--------+--------+----------------------+-------------+ ICA Prox  95      25      1-39%                         stent         +----------+--------+--------+--------+----------------------+-------------+ ICA Mid   81      20                                                  +----------+--------+--------+--------+----------------------+-------------+ ICA Distal84      23                                                  +----------+--------+--------+--------+----------------------+-------------+ ECA       270     0       >  50%                          through stent +----------+--------+--------+--------+----------------------+-------------+ +----------+--------+--------+----------------+-------------------+           PSV cm/sEDV cm/sDescribe        Arm Pressure (mmHG) +----------+--------+--------+----------------+-------------------+ [id number]      0       Multiphasic, WNL                    +----------+--------+--------+----------------+-------------------+ +---------+--------+--+--------+--+---------+ VertebralPSV cm/s47EDV  cm/s14Antegrade +---------+--------+--+--------+--+---------+   Summary: Right Carotid: Velocities in the right ICA are consistent with a 1-39% stenosis.                Non-hemodynamically significant plaque <50% noted in the CCA. Left Carotid: Velocities in the left ICA are consistent with a 1-39% stenosis.               Non-hemodynamically significant plaque <50% noted in the CCA. Vertebrals:  Bilateral vertebral arteries demonstrate antegrade flow. Subclavians: Normal flow hemodynamics were seen in bilateral subclavian              arteries. *See table(s) above for measurements and observations.  Electronically signed by Selinda Gu MD on 02/14/2024 at 8:40:22 AM.    Final    ECHOCARDIOGRAM COMPLETE Result Date: 01/26/2024    ECHOCARDIOGRAM REPORT   Patient Name:   MYRA WENG Date of Exam: 01/26/2024 Medical Rec #:  [id number]      Height:       65.0 in Accession #:    [id number]     Weight:       185.8 lb Date of Birth:  [birth year]      BSA:          1.917 m Patient Age:    82 years       BP:           143/68 mmHg Patient Gender: F              HR:           72 bpm. Exam Location:  [facility] Procedure: 2D Echo, Cardiac Doppler, Color Doppler and Saline Contrast Bubble            Study (Both Spectral and Color Flow Doppler were utilized during            procedure). Indications:     Stroke I63.9  History:         Patient has no prior history of Echocardiogram examinations. No                  medical histories on file.  Sonographer:     Christopher Furnace Referring Phys:  [id number] ALMARIE BAKE OUMA Diagnosing Phys: Evalene Lunger MD IMPRESSIONS  1. Left ventricular ejection fraction, by estimation, is 60 to 65%. The left ventricle has normal function. The left ventricle has no regional wall motion abnormalities. There is mild left ventricular hypertrophy. Left ventricular diastolic parameters are indeterminate.  2. Right ventricular systolic function is normal. The right ventricular size is normal.  3. Left atrial size  was moderately dilated.  4. Right atrial size was moderately dilated.  5. The mitral valve is normal in structure. Mild mitral valve regurgitation. No evidence of mitral stenosis.  6. The aortic valve is normal in structure. Aortic valve regurgitation is not visualized. Aortic valve sclerosis is present, with no evidence of aortic valve stenosis.  7. The inferior vena cava  is normal in size with greater than 50% respiratory variability, suggesting right atrial pressure of 3 mmHg.  8. Agitated saline contrast bubble study was negative, with no evidence of any interatrial shunt. FINDINGS  Left Ventricle: Left ventricular ejection fraction, by estimation, is 60 to 65%. The left ventricle has normal function. The left ventricle has no regional wall motion abnormalities. Strain was performed and the global longitudinal strain is indeterminate. The left ventricular internal cavity size was normal in size. There is mild left ventricular hypertrophy. Left ventricular diastolic parameters are indeterminate. Right Ventricle: The right ventricular size is normal. No increase in right ventricular wall thickness. Right ventricular systolic function is normal. Left Atrium: Left atrial size was moderately dilated. Right Atrium: Right atrial size was moderately dilated. Pericardium: There is no evidence of pericardial effusion. Mitral Valve: The mitral valve is normal in structure. Mild mitral annular calcification. Mild mitral valve regurgitation. No evidence of mitral valve stenosis. Tricuspid Valve: The tricuspid valve is normal in structure. Tricuspid valve regurgitation is not demonstrated. No evidence of tricuspid stenosis. Aortic Valve: The aortic valve is normal in structure. Aortic valve regurgitation is not visualized. Aortic valve sclerosis is present, with no evidence of aortic valve stenosis. Aortic valve mean gradient measures 11.3 mmHg. Aortic valve peak gradient measures 19.8 mmHg. Aortic valve area, by VTI measures  1.53 cm. Pulmonic Valve: The pulmonic valve was normal in structure. Pulmonic valve regurgitation is not visualized. No evidence of pulmonic stenosis. Aorta: The aortic root is normal in size and structure. Venous: The inferior vena cava is normal in size with greater than 50% respiratory variability, suggesting right atrial pressure of 3 mmHg. IAS/Shunts: No atrial level shunt detected by color flow Doppler. Agitated saline contrast was given intravenously to evaluate for intracardiac shunting. Agitated saline contrast bubble study was negative, with no evidence of any interatrial shunt. There  is no evidence of a patent foramen ovale. There is no evidence of an atrial septal defect. Additional Comments: 3D was performed not requiring image post processing on an independent workstation and was indeterminate.  LEFT VENTRICLE PLAX 2D LVIDd:         4.40 cm LVIDs:         2.90 cm LV PW:         1.20 cm LV IVS:        1.10 cm LVOT diam:     2.00 cm LV SV:         66 LV SV Index:   34 LVOT Area:     3.14 cm  RIGHT VENTRICLE RV Basal diam:  4.90 cm RV Mid diam:    3.30 cm LEFT ATRIUM             Index        RIGHT ATRIUM           Index LA diam:        4.80 cm 2.50 cm/m   RA Area:     31.00 cm LA Vol (A2C):   88.7 ml 46.26 ml/m  RA Volume:   117.00 ml 61.02 ml/m LA Vol (A4C):   83.8 ml 43.71 ml/m LA Biplane Vol: 87.1 ml 45.43 ml/m  AORTIC VALVE AV Area (Vmax):    1.41 cm AV Area (Vmean):   1.47 cm AV Area (VTI):     1.53 cm AV Vmax:           222.33 cm/s AV Vmean:          154.000  cm/s AV VTI:            0.432 m AV Peak Grad:      19.8 mmHg AV Mean Grad:      11.3 mmHg LVOT Vmax:         99.90 cm/s LVOT Vmean:        72.300 cm/s LVOT VTI:          0.210 m LVOT/AV VTI ratio: 0.49  AORTA Ao Root diam: 3.20 cm MITRAL VALVE                TRICUSPID VALVE MV Area (PHT): 3.85 cm     TR Peak grad:   30.0 mmHg MV Decel Time: 197 msec     TR Vmax:        274.00 cm/s MV E velocity: 116.00 cm/s                              SHUNTS                             Systemic VTI:  0.21 m                             Systemic Diam: 2.00 cm Evalene Lunger MD Electronically signed by Evalene Lunger MD Signature Date/Time: 01/26/2024/3:05:22 PM    Final    MR BRAIN WO CONTRAST Result Date: 01/26/2024 CLINICAL DATA:  Initial evaluation for acute neuro deficit, stroke suspected. EXAM: MRI HEAD WITHOUT CONTRAST TECHNIQUE: Multiplanar, multiecho pulse sequences of the brain and surrounding structures were obtained without intravenous contrast. COMPARISON:  Comparison made with prior CTs from 01/24/2024 FINDINGS: Brain: Examination degraded by motion artifact. Cerebral volume within normal limits for age. Patchy and confluent T2/FLAIR hyperintensity involving the periventricular and deep white matter both cerebral hemispheres as well as the pons, consistent with chronic small vessel ischemic disease, moderate to advanced in nature. Few scattered superimposed remote lacunar infarcts present about the hemispheric cerebral white matter, more pronounced within the left cerebral hemisphere as compared to the right. Probable small remote cortical infarct at the high posterior left parietal lobe (series 11, image 45). No evidence for acute or subacute infarct. Gray-white matter differentiation maintained. No acute or chronic intracranial blood products. No mass lesion, midline shift or mass effect. Mild ventricular prominence related to global parenchymal volume loss of hydrocephalus. No extra-axial fluid collection. Pituitary gland within normal limits. Vascular: Loss of normal flow void within the hypoplastic intradural right V4 segment, consistent with chronic occlusion as seen on prior CTA. Major intracranial vascular flow voids are otherwise maintained. Skull and upper cervical spine: Craniocervical junction within normal limits. Bone marrow signal intensity within normal limits. No scalp soft tissue abnormality. Sinuses/Orbits: Prior bilateral ocular  lens replacement. Paranasal sinuses are largely clear. No mastoid effusion. Other: None. IMPRESSION: 1. No acute intracranial abnormality. 2. Moderate to advanced chronic microvascular ischemic disease with a few scattered remote lacunar infarcts involving the hemispheric cerebral white matter. Electronically Signed   By: Morene Hoard M.D.   On: 01/26/2024 04:27   DG Chest Port 1 View Result Date: 01/25/2024 CLINICAL DATA:  Pneumonia.  Atrial fibrillation. EXAM: PORTABLE CHEST 1 VIEW COMPARISON:  03/18/2023 FINDINGS: Cardiomegaly remains stable. Small sub pulmonic left pleural effusion is seen with mild atelectasis or infiltrate in left lung base. Right lung is grossly clear.  IMPRESSION: Small sub pulmonic left pleural effusion, with mild left basilar atelectasis versus infiltrate. Recommend continued chest radiographic follow-up to confirm resolution. Stable cardiomegaly. Electronically Signed   By: Norleen DELENA Kil M.D.   On: 01/25/2024 10:13   CT ABDOMEN PELVIS WO CONTRAST Result Date: 01/25/2024 CLINICAL DATA:  Sepsis EXAM: CT ABDOMEN AND PELVIS WITHOUT CONTRAST TECHNIQUE: Multidetector CT imaging of the abdomen and pelvis was performed following the standard protocol without IV contrast. RADIATION DOSE REDUCTION: This exam was performed according to the departmental dose-optimization program which includes automated exposure control, adjustment of the mA and/or kV according to patient size and/or use of iterative reconstruction technique. COMPARISON:  None Available. FINDINGS: Lower chest: Bibasilar atelectasis. Mild cardiomegaly and coronary artery disease. Hepatobiliary: 2 cm gallstone layering within the gallbladder. No evidence of acute cholecystitis or biliary ductal dilatation. No focal hepatic abnormality. Pancreas: No focal abnormality or ductal dilatation. Spleen: No focal abnormality.  Normal size. Adrenals/Urinary Tract: Adrenal glands are normal. Contrast fills the collecting systems,  ureters and bladder from earlier CT angio head. No hydronephrosis. No suspicious renal abnormality. Urinary bladder unremarkable. Stomach/Bowel: Normal appendix. Stomach, large and small bowel grossly unremarkable. Vascular/Lymphatic: No evidence of aneurysm or adenopathy. Aortic atherosclerosis. Reproductive: Prior hysterectomy. No adnexal masses. 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 A part of the fistula may be seen along the inferior bladder on sagittal image 68, series 6. Other: No free fluid or free air. Musculoskeletal: No acute bony abnormality. Prior right hip replacement. IMPRESSION: Contrast seen in the vagina. 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 Contrast fills the urinary bladder from earlier CTA head. Cholelithiasis.  No CT evidence of acute cholecystitis. Cardiomegaly, coronary artery disease.  Aortic atherosclerosis. Electronically Signed   By: Franky Crease M.D.   On: 01/25/2024 02:15   CT ANGIO HEAD NECK W WO CM W PERF (CODE STROKE) Result Date: 01/24/2024 CLINICAL DATA:  Neuro deficit, acute, stroke suspected EXAM: CT ANGIOGRAPHY HEAD AND NECK CT PERFUSION BRAIN TECHNIQUE: Multidetector CT imaging of the head and neck was performed using the standard protocol during bolus administration of intravenous contrast. Multiplanar CT image reconstructions and MIPs were obtained to evaluate the vascular anatomy. Carotid stenosis measurements (when applicable) are obtained utilizing NASCET criteria, using the distal internal carotid diameter as the denominator. Multiphase CT imaging of the brain was performed following IV bolus contrast injection. Subsequent parametric perfusion maps were calculated using RAPID software. RADIATION DOSE REDUCTION: This exam was performed according to the departmental dose-optimization program which includes automated exposure control, adjustment of the mA and/or kV according to patient size and/or use of  iterative reconstruction technique. CONTRAST:  OMNIPAQUE  IOHEXOL  350 MG/ML SOLN COMPARISON:  CTA head neck 01/29/2023. FINDINGS: CTA NECK FINDINGS Limited evaluation due to poor contrast bolus timing. Within this limitation: Aortic arch: Great vessel origins are patent without significant stenosis Right carotid system: Atherosclerosis at carotid bifurcation with approximately 80% stenosis. Left carotid system: Carotid stent in place which traverses the carotid bifurcation and is patent. Vertebral arteries: Left dominant. Similar occlusion of the distal right vertebral artery with poor opacification proximally. Left vertebral artery is patent with similar moderate origin stenosis. Skeleton: No acute abnormality on limited assessment. Other neck: No acute abnormality on limited assessment. Upper chest: No acute abnormality on limited assessment. Review of the MIP images confirms the above findings CTA HEAD FINDINGS Limited evaluation due to poor contrast bolus timing. Within this limitation: Anterior circulation: Atherosclerosis of bilateral intracranial  ICAs. The intracranial ICAs and proximal MCAs are patent. The proximal ACAs are patent bilaterally. Limited distal MCA and ACA evaluation due to poor contrast bolus timing and motion. Posterior circulation: Similar severe atherosclerotic narrowing of the left intradural vertebral artery proximally. Poorly opacified right intradural vertebral artery, chronic. The basilar artery and bilateral posterior arteries are patent. Similar small right P1 PCA with bilateral P2 PCA stenoses. Mid to distal PCAs are poorly evaluated due to contrast bolus timing. Venous sinuses: Not well evaluated. Review of the MIP images confirms the above findings CT Brain Perfusion Findings: ASPECTS: 10. CBF (<30%) Volume: 0mL Perfusion (Tmax>6.0s) volume: 37mL Mismatch Volume: 37mL Infarction Location:No core infarct identified. IMPRESSION: CTA: 1. Limited study without evidence of  proximal large vessel occlusion. 2. 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 3. Interval stenting of the left carotid bifurcation/proximal ICA with patent stent. 4. Similar bilateral P2 PCA stenosis. 5. 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 6. Similar moderate left vertebral artery origin stenosis. CT perfusion: 1. Approximately 37 mL of reported penumbra, predominantly in the right cerebral hemisphere/ICA territory and potentially due to underlying right ICA stenosis and/or artifact. The degree of penumbra may be overestimated given likely artifactual mismatch in the inferior cerebellum. 2. No evidence of core infarct. An MRI could provide more sensitive evaluation for acute infarct if clinically warranted. Electronically Signed   By: Gilmore GORMAN Molt M.D.   On: 01/24/2024 21:42   CT HEAD CODE STROKE WO CONTRAST Result Date: 01/24/2024 CLINICAL DATA:  Code stroke.  Neuro deficit, acute, stroke suspected EXAM: CT HEAD WITHOUT CONTRAST TECHNIQUE: Contiguous axial images were obtained from the base of the skull through the vertex without intravenous contrast. RADIATION DOSE REDUCTION: This exam was performed according to the departmental dose-optimization program which includes automated exposure control, adjustment of the mA and/or kV according to patient size and/or use of iterative reconstruction technique. COMPARISON:  None Available. FINDINGS: Motion limited study.  Within this limitation Brain: No evidence of acute large vascular territory infarct acute hemorrhage mass lesion, midline shift or hydrocephalus. Advanced patchy and white matter hypodensities compatible with microvascular ischemic change. Vascular: No hyperdense vessel or unexpected calcification. Skull: Normal. Negative for fracture or focal lesion. Sinuses/Orbits: No acute finding. ASPECTS [facility] Stroke Program Early CT Score) Total score (0-10 with 10 being normal): 10. IMPRESSION: 1.  Motion limited study without evidence of acute large vascular territory infarct or acute hemorrhage. 2. Advanced microvascular ischemic disease. MRI could provide more sensitive evaluation for acute infarct if clinically warranted. Electronically Signed   By: Gilmore GORMAN Molt M.D.   On: 01/24/2024 21:13     Assessment/Plan Atrial fibrillation, chronic (HCC) Possible source of the stroke as well.  Anticoagulation going forward.  Bilateral carotid artery stenosis She had a CT angiogram as part of her workup for this recent stroke that I have independently reviewed.  This was interpreted as an 80% right ICA stenosis in the cervical portion.  A recent duplex did not show significant elevation in velocities in the right ICA that would be concerning for high-grade stenosis, but calcification may have impaired visualization of this area.  Her left carotid stent was widely patent both on CT angiogram and duplex. With a high-grade lesion ipsilateral to her recent right hemispheric stroke, stroke risk reduction would certainly warrant intervention for this area.  As she also has atrial fibrillation that could have been a cause of the lesion, but nonetheless the high-grade carotid stenosis should be addressed for stroke  risk reduction as well.  I have discussed the risks and benefits of both carotid endarterectomy and carotid stenting.  She has had carotid stenting previously and did reasonably well from this and she and her family do not want to have general anesthesia.  I think right carotid stenting would be a reasonable option to address the high-grade lesion in the right carotid artery going forward.  We would have her on Eliquis  and Plavix  as well as her statin agent for the procedure.  Primary hypertension blood pressure control important in reducing the progression of atherosclerotic disease. On appropriate oral medications.     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 Also, involved in wound healing. On appropriate medications.     Dyslipidemia lipid control important in reducing the progression of atherosclerotic disease. Continue statin therapy  Selinda Gu, MD  02/23/2024 6:10 PM    This note was created with Dragon medical transcription system.  Any errors from dictation are purely unintentional

## 2024-02-22 NOTE — Progress Notes (Unsigned)
 MRN : [id number]  Brooke Davis is a 83 y.o. (1940-11-23) female who presents with chief complaint of No chief complaint on file. SABRA  History of Present Illness: ***  Current Outpatient Medications  Medication Sig Dispense Refill   acetaminophen  (TYLENOL ) 325 MG tablet Take 2 tablets (650 mg total) by mouth every 6 (six) hours as needed for mild pain (or Fever >/= 101).     acetaminophen  (TYLENOL ) 325 MG tablet Take 650 mg by mouth every 6 (six) hours as needed for mild pain (pain score 1-3), fever or headache.     allopurinol  (ZYLOPRIM ) 300 MG tablet Take 1 tablet (300 mg total) by mouth daily. 30 tablet 0   allopurinol  (ZYLOPRIM ) 300 MG tablet Take 300 mg by mouth daily.     ALPRAZolam  (XANAX ) 0.25 MG tablet Take 1 tablet (0.25 mg total) by mouth 2 (two) times daily as needed for anxiety. 5 tablet 0   ALPRAZolam  (XANAX ) 0.25 MG tablet Take 0.25 mg by mouth 2 (two) times daily as needed for anxiety.     apixaban  (ELIQUIS ) 5 MG TABS tablet Take 1 tablet (5 mg total) by mouth 2 (two) times daily. 180 tablet 0   aspirin  81 MG chewable tablet Chew 1 tablet (81 mg total) by mouth daily. 90 tablet 0   atorvastatin  (LIPITOR ) 80 MG tablet Take 1 tablet (80 mg total) by mouth daily. 30 tablet 11   atorvastatin  (LIPITOR ) 80 MG tablet Take 80 mg by mouth daily.     ciprofloxacin  (CIPRO ) 500 MG tablet Take 1 tablet (500 mg total) by mouth 2 (two) times daily. 60 tablet 1   citalopram  (CELEXA ) 20 MG tablet Take 1 tablet (20 mg total) by mouth daily. 30 tablet 0   citalopram  (CELEXA ) 20 MG tablet Take 20 mg by mouth daily.     clopidogrel  (PLAVIX ) 75 MG tablet TAKE 1 TABLET(75 MG) BY MOUTH DAILY 90 tablet 1   losartan  (COZAAR ) 50 MG tablet Take 1 tablet (50 mg total) by mouth daily for 25 days. 30 tablet 0   melatonin 5 MG TABS Take 1 tablet (5 mg total) by mouth at bedtime. 30 tablet 0   metoprolol  tartrate (LOPRESSOR ) 25 MG tablet Take 1 tablet (25 mg total) by mouth 2 (two) times daily. 60  tablet 0   metoprolol  tartrate (LOPRESSOR ) 25 MG tablet Take 25 mg by mouth 2 (two) times daily.     nystatin  (MYCOSTATIN /NYSTOP ) powder Apply 1 Application topically 3 (three) times daily. 15 g 0   potassium chloride  (KLOR-CON ) 10 MEQ tablet Take 1 tablet (10 mEq total) by mouth daily. 30 tablet 0   potassium chloride  (KLOR-CON ) 10 MEQ tablet Take 10 mEq by mouth daily.     torsemide  (DEMADEX ) 20 MG tablet Take 1 tablet (20 mg total) by mouth daily. 30 tablet 0   torsemide  (DEMADEX ) 20 MG tablet Take 20 mg by mouth daily.     traMADol  (ULTRAM ) 50 MG tablet Take 50 mg by mouth every 4 (four) hours as needed.     traMADol  (ULTRAM ) 50 MG tablet Take 50 mg by mouth every 4 (four) hours as needed for moderate pain (pain score 4-6).     No current facility-administered medications for this visit.    Past Medical History:  Diagnosis Date   Anxiety    a.) on BZO (alprazolam ) PRN   Arthritis    fingers, right hip   Atrial fibrillation (HCC) 11/10/2013   a.) CHA2DS2VASc = 5 (age x2,  sex, HTN, T2DM);  b.) rate/rhythm maintained on oral metoprolol  tartrate; chronically anticoagulated with apixaban    Bronchitis    Cerebrovascular small vessel disease (chronic)    Coronary artery disease involving native coronary artery of native heart 01/11/2021   a.) LHC 01/11/2021: 40% oLM, 50% mLCx, 30% oLCx, 30% pLAD, 30% mLAD, 99% pRCA (2.75 x 30 mm Resolute Onyx DES), 75% p-mRCA, 60% dRCA   DDD (degenerative disc disease), lumbar 12/01/2013   Diastolic dysfunction 01/12/2021   a.) TTE 01/12/2021 (s/p inf STEMI): EF 55-60%, mild-mod LAE, mild RAE, mod RVE, mild AR/MR, mod TR, G1DD   Diet-controlled type 2 diabetes mellitus (HCC)    Full dentures    Gout    History of bilateral cataract extraction 2021   Hyperlipidemia    Hypertension, essential    Long term current use of anticoagulant    a.) apixaban    PONV (postoperative nausea and vomiting)    Sinoatrial node dysfunction (HCC) 11/10/2013   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  All OK.   CAROTID PTA/STENT INTERVENTION Left 02/25/2023   Procedure: CAROTID PTA/STENT INTERVENTION;  Surgeon: Marea Selinda RAMAN, MD;  Location: [facility];  Service: Cardiovascular;  Laterality: Left;   CATARACT EXTRACTION W/PHACO Left 03/27/2020   Procedure: CATARACT EXTRACTION PHACO AND INTRAOCULAR LENS PLACEMENT (IOC) LEFT 7.49  00:56.4  13.3%;  Surgeon: Mittie Gaskin, MD;  Location: [facility];  Service: Ophthalmology;  Laterality: Left;   CATARACT EXTRACTION W/PHACO Right 04/24/2020   Procedure: CATARACT EXTRACTION PHACO AND INTRAOCULAR LENS PLACEMENT (IOC) RIGHT;  Surgeon: Mittie Gaskin, MD;  Location: [facility];  Service: Ophthalmology;  Laterality: Right;  8.34 0:50.2 16.6%   CORONARY/GRAFT ACUTE MI REVASCULARIZATION N/A 01/11/2021   Procedure: Coronary/Graft Acute MI Revascularization;  Surgeon: Ammon Blunt, MD;  Location: [facility];  Service: Cardiovascular;  Laterality: N/A;   INCISION AND DRAINAGE Right 03/21/2023   Procedure: RIGHT HIP IRRIGATION AND DEBRIDEMENT OF INFECTED TOTAL JOINT, EXCHANGE OF HEAD AND POLY COMPONENTS WITH PLACEMENT OF ANTIBIOTIC BEADS;  Surgeon: Zafonte, Brian Thomas, MD;  Location: [facility];  Service: Orthopedics;  Laterality: Right;   LEFT HEART CATH AND CORONARY ANGIOGRAPHY N/A 01/11/2021   Procedure: LEFT HEART CATH AND CORONARY ANGIOGRAPHY;  Surgeon: Ammon Blunt, MD;  Location: [facility];  Service: Cardiovascular;  Laterality: N/A;   PARTIAL HYSTERECTOMY     TOE SURGERY Left    great toe; rod in place   TOTAL HIP  ARTHROPLASTY Right 01/18/2023   Procedure: TOTAL HIP ARTHROPLASTY;  Surgeon: Mardee Lynwood SQUIBB, MD;  Location: [facility];  Service: Orthopedics;  Laterality: Right;   TOTAL KNEE ARTHROPLASTY Right 2007     Social History   Tobacco Use   Smoking status: Former   Smokeless tobacco: Never  Vaping Use   Vaping status: Never Used  Substance Use Topics   Alcohol use: Not Currently   Drug use: Never   ***    Family History  Problem Relation Age of Onset   Heart attack Mother 43       required open  heart surgery. Onset age is unknown.   Stroke Father    Diabetes Father    Alcoholism Brother    Breast cancer Neg Hx    ***  Allergies  Allergen Reactions   Celebrex [Celecoxib] Diarrhea   Penicillins Itching    IgE = 154 (WNL) on 01/05/2023   Relafen [Nabumetone]     Palpitation    Sulfa Antibiotics    Chlorhexidine  Rash   Lovastatin Diarrhea   Tape Rash     REVIEW OF SYSTEMS (Negative unless checked)  Constitutional: [] Weight loss  [] Fever  [] Chills Cardiac: [] Chest pain   [] Chest pressure   [] Palpitations   [] Shortness of breath when laying flat   [] Shortness of breath at rest   [] Shortness of breath with exertion. Vascular:  [] Pain in legs with walking   [] Pain in legs at rest   [] Pain in legs when laying flat   [] Claudication   [] Pain in feet when walking  [] Pain in feet at rest  [] Pain in feet when laying flat   [] History of DVT   [] Phlebitis   [] Swelling in legs   [] Varicose veins   [] Non-healing ulcers Pulmonary:   [] Uses home oxygen   [] Productive cough   [] Hemoptysis   [] Wheeze  [] COPD   [] Asthma Neurologic:  [] Dizziness  [] Blackouts   [] Seizures   [] History of stroke   [] History of TIA  [] Aphasia   [] Temporary blindness   [] Dysphagia   [] Weakness or numbness in arms   [] Weakness or numbness in legs Musculoskeletal:  [] Arthritis   [] Joint swelling   [] Joint pain   [] Low back pain Hematologic:  [] Easy bruising  [] Easy bleeding   [] Hypercoagulable state   [] Anemic   [] Hepatitis Gastrointestinal:  [] Blood in stool   [] Vomiting blood  [] Gastroesophageal reflux/heartburn   [] Difficulty swallowing. Genitourinary:  [] Chronic kidney disease   [] Difficult urination  [] Frequent urination  [] Burning with urination   [] Blood in urine Skin:  [] Rashes   [] Ulcers   [] Wounds Psychological:  [] History of anxiety   []  History of major depression.  Physical Examination  There were no vitals filed for this visit. There is no height or weight on file to calculate BMI. Gen:  WD/WN, NAD Head: [place]/AT, No temporalis wasting. Ear/Nose/Throat: Hearing grossly intact, nares w/o erythema or drainage, trachea midline Eyes: Conjunctiva clear. Sclera non-icteric Neck: Supple.  *** bruit  Pulmonary:  Good air movement, equal and clear to auscultation bilaterally.  Cardiac: RRR, No JVD Vascular: *** Vessel Right Left  Radial Palpable Palpable                  Femoral Palpable Palpable  Popliteal Palpable Palpable  PT Palpable Palpable  DP Palpable Palpable   Gastrointestinal: soft, non-tender/non-distended. No guarding/reflex.  Musculoskeletal: M/S 5/5 throughout.  No deformity or atrophy. *** edema. Neurologic: CN 2-12 intact. Sensation grossly intact in extremities.  Symmetrical.  Speech is fluent. Motor exam as listed above. Psychiatric: Judgment intact, Mood & affect appropriate for pt's clinical situation. Dermatologic: No rashes or ulcers noted.  No cellulitis or open wounds. Lymph : No Cervical, Axillary, or Inguinal lymphadenopathy.    CBC Lab Results  Component Value Date   WBC 9.0 01/27/2024   HGB 11.1 (L) 01/27/2024   HCT 33.3 (L) 01/27/2024   MCV 104.4 (H) 01/27/2024   PLT 164 01/27/2024    BMET    Component Value Date/Time   NA 139 01/27/2024 0357   K 3.6 01/27/2024 0357   CL 102 01/27/2024 0357   CO2  27 01/27/2024 0357   GLUCOSE 125 (H) 01/27/2024 0357   BUN 20 01/27/2024 0357   CREATININE 0.85 01/27/2024 0357   CALCIUM  9.5 01/27/2024 0357    GFRNONAA >60 01/27/2024 0357   CrCl cannot be calculated (Patient's most recent lab result is older than the maximum 21 days allowed.).  COAG Lab Results  Component Value Date   INR 1.1 01/24/2024   INR 2.2 (H) 03/18/2023   INR 1.4 (H) 07/14/2021    Radiology VAS US  CAROTID Result Date: 02/14/2024 Carotid Arterial Duplex Study Patient Name:  Brooke Davis [facility]  Date of Exam:   02/09/2024 Medical Rec #: [id number]         Accession #:    [id number] Date of Birth: [birth year]         Patient Gender: F Patient Age:   51 years Exam Location:  [facility] Procedure:      VAS US  CAROTID Referring Phys: SELINDA GU --------------------------------------------------------------------------------  Indications:   CVA, Carotid artery disease and Left stent. Risk Factors:  Prior MI. Other Factors: 02/25/2023: Placement of a 9 mm proximal, 7 mm distal 4 cm long                Exact stent with the use of the NAV-6 embolic protection device                in the Left Carotid Artery. Performing Technologist: Donnice Charnley RVT  Examination Guidelines: A complete evaluation includes B-mode imaging, spectral Doppler, color Doppler, and power Doppler as needed of all accessible portions of each vessel. Bilateral testing is considered an integral part of a complete examination. Limited examinations for reoccurring indications may be performed as noted.  Right Carotid Findings: +----------+--------+--------+--------+----------------------+--------+           PSV cm/sEDV cm/sStenosisPlaque Description    Comments +----------+--------+--------+--------+----------------------+--------+ CCA Prox  111     18                                             +----------+--------+--------+--------+----------------------+--------+ CCA Mid   93      16      <50%    irregular and calcific         +----------+--------+--------+--------+----------------------+--------+ CCA Distal81      16                                              +----------+--------+--------+--------+----------------------+--------+ ICA Prox  129     35      1-39%   irregular and calcific         +----------+--------+--------+--------+----------------------+--------+ ICA Mid   99      22                                             +----------+--------+--------+--------+----------------------+--------+ ICA Distal115     26                                             +----------+--------+--------+--------+----------------------+--------+ ECA  132     10              irregular and calcific         +----------+--------+--------+--------+----------------------+--------+ +----------+--------+-------+----------------+-------------------+           PSV cm/sEDV cmsDescribe        Arm Pressure (mmHG) +----------+--------+-------+----------------+-------------------+ [id number]     0      Multiphasic, WNL                    +----------+--------+-------+----------------+-------------------+ +---------+--------+--+--------+---------+ VertebralPSV cm/s22EDV cm/sAntegrade +---------+--------+--+--------+---------+  Left Carotid Findings: +----------+--------+--------+--------+----------------------+-------------+           PSV cm/sEDV cm/sStenosisPlaque Description    Comments      +----------+--------+--------+--------+----------------------+-------------+ CCA Prox  76      15      <50%    irregular and calcific              +----------+--------+--------+--------+----------------------+-------------+ CCA Mid   78      18                                                  +----------+--------+--------+--------+----------------------+-------------+ CCA Distal63      13                                    stent         +----------+--------+--------+--------+----------------------+-------------+ ICA Prox  95      25      1-39%                         stent          +----------+--------+--------+--------+----------------------+-------------+ ICA Mid   81      20                                                  +----------+--------+--------+--------+----------------------+-------------+ ICA Distal84      23                                                  +----------+--------+--------+--------+----------------------+-------------+ ECA       270     0       >50%                          through stent +----------+--------+--------+--------+----------------------+-------------+ +----------+--------+--------+----------------+-------------------+           PSV cm/sEDV cm/sDescribe        Arm Pressure (mmHG) +----------+--------+--------+----------------+-------------------+ [id number]      0       Multiphasic, WNL                    +----------+--------+--------+----------------+-------------------+ +---------+--------+--+--------+--+---------+ VertebralPSV cm/s47EDV cm/s14Antegrade +---------+--------+--+--------+--+---------+   Summary: Right Carotid: Velocities in the right ICA are consistent with a 1-39% stenosis.                Non-hemodynamically significant plaque <50% noted in the CCA. Left Carotid: Velocities in the  left ICA are consistent with a 1-39% stenosis.               Non-hemodynamically significant plaque <50% noted in the CCA. Vertebrals:  Bilateral vertebral arteries demonstrate antegrade flow. Subclavians: Normal flow hemodynamics were seen in bilateral subclavian              arteries. *See table(s) above for measurements and observations.  Electronically signed by Selinda Gu MD on 02/14/2024 at 8:40:22 AM.    Final    ECHOCARDIOGRAM COMPLETE Result Date: 01/26/2024    ECHOCARDIOGRAM REPORT   Patient Name:   Brooke Davis Date of Exam: 01/26/2024 Medical Rec #:  [id number]      Height:       65.0 in Accession #:    [id number]     Weight:       185.8 lb Date of Birth:  Dec 13, 1940      BSA:          1.917 m Patient Age:     82 years       BP:           143/68 mmHg Patient Gender: F              HR:           72 bpm. Exam Location:  [facility] Procedure: 2D Echo, Cardiac Doppler, Color Doppler and Saline Contrast Bubble            Study (Both Spectral and Color Flow Doppler were utilized during            procedure). Indications:     Stroke I63.9  History:         Patient has no prior history of Echocardiogram examinations. No                  medical histories on file.  Sonographer:     Christopher Furnace Referring Phys:  [id number] ALMARIE BAKE OUMA Diagnosing Phys: Evalene Lunger MD IMPRESSIONS  1. Left ventricular ejection fraction, by estimation, is 60 to 65%. The left ventricle has normal function. The left ventricle has no regional wall motion abnormalities. There is mild left ventricular hypertrophy. Left ventricular diastolic parameters are indeterminate.  2. Right ventricular systolic function is normal. The right ventricular size is normal.  3. Left atrial size was moderately dilated.  4. Right atrial size was moderately dilated.  5. The mitral valve is normal in structure. Mild mitral valve regurgitation. No evidence of mitral stenosis.  6. The aortic valve is normal in structure. Aortic valve regurgitation is not visualized. Aortic valve sclerosis is present, with no evidence of aortic valve stenosis.  7. The inferior vena cava is normal in size with greater than 50% respiratory variability, suggesting right atrial pressure of 3 mmHg.  8. Agitated saline contrast bubble study was negative, with no evidence of any interatrial shunt. FINDINGS  Left Ventricle: Left ventricular ejection fraction, by estimation, is 60 to 65%. The left ventricle has normal function. The left ventricle has no regional wall motion abnormalities. Strain was performed and the global longitudinal strain is indeterminate. The left ventricular internal cavity size was normal in size. There is mild left ventricular hypertrophy. Left ventricular diastolic parameters  are indeterminate. Right Ventricle: The right ventricular size is normal. No increase in right ventricular wall thickness. Right ventricular systolic function is normal. Left Atrium: Left atrial size was moderately dilated. Right Atrium: Right atrial size was moderately dilated. Pericardium: There is no evidence of pericardial  effusion. Mitral Valve: The mitral valve is normal in structure. Mild mitral annular calcification. Mild mitral valve regurgitation. No evidence of mitral valve stenosis. Tricuspid Valve: The tricuspid valve is normal in structure. Tricuspid valve regurgitation is not demonstrated. No evidence of tricuspid stenosis. Aortic Valve: The aortic valve is normal in structure. Aortic valve regurgitation is not visualized. Aortic valve sclerosis is present, with no evidence of aortic valve stenosis. Aortic valve mean gradient measures 11.3 mmHg. Aortic valve peak gradient measures 19.8 mmHg. Aortic valve area, by VTI measures 1.53 cm. Pulmonic Valve: The pulmonic valve was normal in structure. Pulmonic valve regurgitation is not visualized. No evidence of pulmonic stenosis. Aorta: The aortic root is normal in size and structure. Venous: The inferior vena cava is normal in size with greater than 50% respiratory variability, suggesting right atrial pressure of 3 mmHg. IAS/Shunts: No atrial level shunt detected by color flow Doppler. Agitated saline contrast was given intravenously to evaluate for intracardiac shunting. Agitated saline contrast bubble study was negative, with no evidence of any interatrial shunt. There  is no evidence of a patent foramen ovale. There is no evidence of an atrial septal defect. Additional Comments: 3D was performed not requiring image post processing on an independent workstation and was indeterminate.  LEFT VENTRICLE PLAX 2D LVIDd:         4.40 cm LVIDs:         2.90 cm LV PW:         1.20 cm LV IVS:        1.10 cm LVOT diam:     2.00 cm LV SV:         66 LV SV Index:    34 LVOT Area:     3.14 cm  RIGHT VENTRICLE RV Basal diam:  4.90 cm RV Mid diam:    3.30 cm LEFT ATRIUM             Index        RIGHT ATRIUM           Index LA diam:        4.80 cm 2.50 cm/m   RA Area:     31.00 cm LA Vol (A2C):   88.7 ml 46.26 ml/m  RA Volume:   117.00 ml 61.02 ml/m LA Vol (A4C):   83.8 ml 43.71 ml/m LA Biplane Vol: 87.1 ml 45.43 ml/m  AORTIC VALVE AV Area (Vmax):    1.41 cm AV Area (Vmean):   1.47 cm AV Area (VTI):     1.53 cm AV Vmax:           222.33 cm/s AV Vmean:          154.000 cm/s AV VTI:            0.432 m AV Peak Grad:      19.8 mmHg AV Mean Grad:      11.3 mmHg LVOT Vmax:         99.90 cm/s LVOT Vmean:        72.300 cm/s LVOT VTI:          0.210 m LVOT/AV VTI ratio: 0.49  AORTA Ao Root diam: 3.20 cm MITRAL VALVE                TRICUSPID VALVE MV Area (PHT): 3.85 cm     TR Peak grad:   30.0 mmHg MV Decel Time: 197 msec     TR Vmax:        274.00 cm/s  MV E velocity: 116.00 cm/s                             SHUNTS                             Systemic VTI:  0.21 m                             Systemic Diam: 2.00 cm Evalene Lunger MD Electronically signed by Evalene Lunger MD Signature Date/Time: 01/26/2024/3:05:22 PM    Final    MR BRAIN WO CONTRAST Result Date: 01/26/2024 CLINICAL DATA:  Initial evaluation for acute neuro deficit, stroke suspected. EXAM: MRI HEAD WITHOUT CONTRAST TECHNIQUE: Multiplanar, multiecho pulse sequences of the brain and surrounding structures were obtained without intravenous contrast. COMPARISON:  Comparison made with prior CTs from 01/24/2024 FINDINGS: Brain: Examination degraded by motion artifact. Cerebral volume within normal limits for age. Patchy and confluent T2/FLAIR hyperintensity involving the periventricular and deep white matter both cerebral hemispheres as well as the pons, consistent with chronic small vessel ischemic disease, moderate to advanced in nature. Few scattered superimposed remote lacunar infarcts present about the hemispheric  cerebral white matter, more pronounced within the left cerebral hemisphere as compared to the right. Probable small remote cortical infarct at the high posterior left parietal lobe (series 11, image 45). No evidence for acute or subacute infarct. Gray-white matter differentiation maintained. No acute or chronic intracranial blood products. No mass lesion, midline shift or mass effect. Mild ventricular prominence related to global parenchymal volume loss of hydrocephalus. No extra-axial fluid collection. Pituitary gland within normal limits. Vascular: Loss of normal flow void within the hypoplastic intradural right V4 segment, consistent with chronic occlusion as seen on prior CTA. Major intracranial vascular flow voids are otherwise maintained. Skull and upper cervical spine: Craniocervical junction within normal limits. Bone marrow signal intensity within normal limits. No scalp soft tissue abnormality. Sinuses/Orbits: Prior bilateral ocular lens replacement. Paranasal sinuses are largely clear. No mastoid effusion. Other: None. IMPRESSION: 1. No acute intracranial abnormality. 2. Moderate to advanced chronic microvascular ischemic disease with a few scattered remote lacunar infarcts involving the hemispheric cerebral white matter. Electronically Signed   By: Morene Hoard M.D.   On: 01/26/2024 04:27   DG Chest Port 1 View Result Date: 01/25/2024 CLINICAL DATA:  Pneumonia.  Atrial fibrillation. EXAM: PORTABLE CHEST 1 VIEW COMPARISON:  03/18/2023 FINDINGS: Cardiomegaly remains stable. Small sub pulmonic left pleural effusion is seen with mild atelectasis or infiltrate in left lung base. Right lung is grossly clear. IMPRESSION: Small sub pulmonic left pleural effusion, with mild left basilar atelectasis versus infiltrate. Recommend continued chest radiographic follow-up to confirm resolution. Stable cardiomegaly. Electronically Signed   By: Norleen DELENA Kil M.D.   On: 01/25/2024 10:13   CT ABDOMEN PELVIS WO  CONTRAST Result Date: 01/25/2024 CLINICAL DATA:  Sepsis EXAM: CT ABDOMEN AND PELVIS WITHOUT CONTRAST TECHNIQUE: Multidetector CT imaging of the abdomen and pelvis was performed following the standard protocol without IV contrast. RADIATION DOSE REDUCTION: This exam was performed according to the departmental dose-optimization program which includes automated exposure control, adjustment of the mA and/or kV according to patient size and/or use of iterative reconstruction technique. COMPARISON:  None Available. FINDINGS: Lower chest: Bibasilar atelectasis. Mild cardiomegaly and coronary artery disease. Hepatobiliary: 2 cm gallstone layering within the gallbladder. No evidence of acute  cholecystitis or biliary ductal dilatation. No focal hepatic abnormality. Pancreas: No focal abnormality or ductal dilatation. Spleen: No focal abnormality.  Normal size. Adrenals/Urinary Tract: Adrenal glands are normal. Contrast fills the collecting systems, ureters and bladder from earlier CT angio head. No hydronephrosis. No suspicious renal abnormality. Urinary bladder unremarkable. Stomach/Bowel: Normal appendix. Stomach, large and small bowel grossly unremarkable. Vascular/Lymphatic: No evidence of aneurysm or adenopathy. Aortic atherosclerosis. Reproductive: Prior hysterectomy. No adnexal masses. 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 A part of the fistula may be seen along the inferior bladder on sagittal image 68, series 6. Other: No free fluid or free air. Musculoskeletal: No acute bony abnormality. Prior right hip replacement. IMPRESSION: Contrast seen in the vagina. 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 Contrast fills the urinary bladder from earlier CTA head. Cholelithiasis.  No CT evidence of acute cholecystitis. Cardiomegaly, coronary artery disease.  Aortic atherosclerosis. Electronically Signed   By: Franky Crease M.D.   On: 01/25/2024 02:15   CT ANGIO  HEAD NECK W WO CM W PERF (CODE STROKE) Result Date: 01/24/2024 CLINICAL DATA:  Neuro deficit, acute, stroke suspected EXAM: CT ANGIOGRAPHY HEAD AND NECK CT PERFUSION BRAIN TECHNIQUE: Multidetector CT imaging of the head and neck was performed using the standard protocol during bolus administration of intravenous contrast. Multiplanar CT image reconstructions and MIPs were obtained to evaluate the vascular anatomy. Carotid stenosis measurements (when applicable) are obtained utilizing NASCET criteria, using the distal internal carotid diameter as the denominator. Multiphase CT imaging of the brain was performed following IV bolus contrast injection. Subsequent parametric perfusion maps were calculated using RAPID software. RADIATION DOSE REDUCTION: This exam was performed according to the departmental dose-optimization program which includes automated exposure control, adjustment of the mA and/or kV according to patient size and/or use of iterative reconstruction technique. CONTRAST:  OMNIPAQUE  IOHEXOL  350 MG/ML SOLN COMPARISON:  CTA head neck 01/29/2023. FINDINGS: CTA NECK FINDINGS Limited evaluation due to poor contrast bolus timing. Within this limitation: Aortic arch: Great vessel origins are patent without significant stenosis Right carotid system: Atherosclerosis at carotid bifurcation with approximately 80% stenosis. Left carotid system: Carotid stent in place which traverses the carotid bifurcation and is patent. Vertebral arteries: Left dominant. Similar occlusion of the distal right vertebral artery with poor opacification proximally. Left vertebral artery is patent with similar moderate origin stenosis. Skeleton: No acute abnormality on limited assessment. Other neck: No acute abnormality on limited assessment. Upper chest: No acute abnormality on limited assessment. Review of the MIP images confirms the above findings CTA HEAD FINDINGS Limited evaluation due to poor contrast bolus timing. Within  this limitation: Anterior circulation: Atherosclerosis of bilateral intracranial ICAs. The intracranial ICAs and proximal MCAs are patent. The proximal ACAs are patent bilaterally. Limited distal MCA and ACA evaluation due to poor contrast bolus timing and motion. Posterior circulation: Similar severe atherosclerotic narrowing of the left intradural vertebral artery proximally. Poorly opacified right intradural vertebral artery, chronic. The basilar artery and bilateral posterior arteries are patent. Similar small right P1 PCA with bilateral P2 PCA stenoses. Mid to distal PCAs are poorly evaluated due to contrast bolus timing. Venous sinuses: Not well evaluated. Review of the MIP images confirms the above findings CT Brain Perfusion Findings: ASPECTS: 10. CBF (<30%) Volume: 0mL Perfusion (Tmax>6.0s) volume: 37mL Mismatch Volume: 37mL Infarction Location:No core infarct identified. IMPRESSION: CTA: 1. Limited study without evidence of proximal large vessel occlusion. 2. 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 3. Interval stenting of the left carotid bifurcation/proximal ICA with patent stent. 4. Similar bilateral P2 PCA stenosis. 5. 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 6. Similar moderate left vertebral artery origin stenosis. CT perfusion: 1. Approximately 37 mL of reported penumbra, predominantly in the right cerebral hemisphere/ICA territory and potentially due to underlying right ICA stenosis and/or artifact. The degree of penumbra may be overestimated given likely artifactual mismatch in the inferior cerebellum. 2. No evidence of core infarct. An MRI could provide more sensitive evaluation for acute infarct if clinically warranted. Electronically Signed   By: Gilmore GORMAN Molt M.D.   On: 01/24/2024 21:42   CT HEAD CODE STROKE WO CONTRAST Result Date: 01/24/2024 CLINICAL DATA:  Code stroke.  Neuro deficit, acute, stroke suspected EXAM: CT HEAD WITHOUT  CONTRAST TECHNIQUE: Contiguous axial images were obtained from the base of the skull through the vertex without intravenous contrast. RADIATION DOSE REDUCTION: This exam was performed according to the departmental dose-optimization program which includes automated exposure control, adjustment of the mA and/or kV according to patient size and/or use of iterative reconstruction technique. COMPARISON:  None Available. FINDINGS: Motion limited study.  Within this limitation Brain: No evidence of acute large vascular territory infarct acute hemorrhage mass lesion, midline shift or hydrocephalus. Advanced patchy and white matter hypodensities compatible with microvascular ischemic change. Vascular: No hyperdense vessel or unexpected calcification. Skull: Normal. Negative for fracture or focal lesion. Sinuses/Orbits: No acute finding. ASPECTS [facility] Stroke Program Early CT Score) Total score (0-10 with 10 being normal): 10. IMPRESSION: 1. Motion limited study without evidence of acute large vascular territory infarct or acute hemorrhage. 2. Advanced microvascular ischemic disease. MRI could provide more sensitive evaluation for acute infarct if clinically warranted. Electronically Signed   By: Gilmore GORMAN Molt M.D.   On: 01/24/2024 21:13     Assessment/Plan No problem-specific Assessment & Plan notes found for this encounter.    Selinda Gu, MD  02/22/2024 11:43 AM    This note was created with Dragon medical transcription system.  Any errors from dictation are purely unintentional

## 2024-02-23 NOTE — Assessment & Plan Note (Signed)
 Possible source of the stroke as well.  Anticoagulation going forward.

## 2024-02-23 NOTE — Assessment & Plan Note (Signed)
 She had a CT angiogram as part of her workup for this recent stroke that I have independently reviewed.  This was interpreted as an 80% right ICA stenosis in the cervical portion.  A recent duplex did not show significant elevation in velocities in the right ICA that would be concerning for high-grade stenosis, but calcification may have impaired visualization of this area.  Her left carotid stent was widely patent both on CT angiogram and duplex. With a high-grade lesion ipsilateral to her recent right hemispheric stroke, stroke risk reduction would certainly warrant intervention for this area.  As she also has atrial fibrillation that could have been a cause of the lesion, but nonetheless the high-grade carotid stenosis should be addressed for stroke risk reduction as well.  I have discussed the risks and benefits of both carotid endarterectomy and carotid stenting.  She has had carotid stenting previously and did reasonably well from this and she and her family do not want to have general anesthesia.  I think right carotid stenting would be a reasonable option to address the high-grade lesion in the right carotid artery going forward.  We would have her on Eliquis  and Plavix  as well as her statin agent for the procedure.

## 2024-02-28 ENCOUNTER — Telehealth (INDEPENDENT_AMBULATORY_CARE_PROVIDER_SITE_OTHER): Payer: Self-pay

## 2024-02-28 NOTE — Telephone Encounter (Signed)
 Spoke with the patient and her daughter to be scheduled for a right carotid stent placement with Dr. Marea at the [facility]. Pre-procedure instructions were discussed and will be sent to Mychart and mailed.

## 2024-03-02 NOTE — Telephone Encounter (Signed)
 Patient was called and asked if she could arrive to the [facility] at 9:30 am on 03/13/24. Patient stated she could do that.

## 2024-03-03 ENCOUNTER — Encounter (INDEPENDENT_AMBULATORY_CARE_PROVIDER_SITE_OTHER): Payer: Self-pay | Admitting: Vascular Surgery

## 2024-03-10 ENCOUNTER — Telehealth (INDEPENDENT_AMBULATORY_CARE_PROVIDER_SITE_OTHER): Payer: Self-pay

## 2024-03-10 NOTE — Telephone Encounter (Signed)
 Patient's daughter had left a message for a return call. Spoke with the daughter and her mother is having blood pressure readings in the high 200's over high 100's but has refused to go to the ED for evaluation. Patient's daughter stated these are the same readings as when she had a stroke. I asked if she had reached out to the PCP and was told that she had not done so. Patient is scheduled for a carotid stent placement on 03/13/24 with Dr. Marea. I advised the patient's daughter that she should likely be evaluated at the ED for the blood pressure as she has had a stroke before. Patient's daughter stated she was going to watch her blood pressure the rest of the day.

## 2024-03-11 ENCOUNTER — Emergency Department
Admission: EM | Admit: 2024-03-11 | Discharge: 2024-03-11 | Disposition: A | Discharge status: Home / Self Care | Source: Home / Self Care | Attending: Emergency Medicine | Admitting: Emergency Medicine

## 2024-03-11 ENCOUNTER — Other Ambulatory Visit: Payer: Self-pay

## 2024-03-11 DIAGNOSIS — I509 Heart failure, unspecified: Secondary | ICD-10-CM | POA: Insufficient documentation

## 2024-03-11 DIAGNOSIS — I6521 Occlusion and stenosis of right carotid artery: Secondary | ICD-10-CM | POA: Diagnosis not present

## 2024-03-11 DIAGNOSIS — E119 Type 2 diabetes mellitus without complications: Secondary | ICD-10-CM | POA: Insufficient documentation

## 2024-03-11 DIAGNOSIS — I110 Hypertensive heart disease with heart failure: Secondary | ICD-10-CM | POA: Insufficient documentation

## 2024-03-11 DIAGNOSIS — Z7901 Long term (current) use of anticoagulants: Secondary | ICD-10-CM | POA: Insufficient documentation

## 2024-03-11 DIAGNOSIS — I10 Essential (primary) hypertension: Secondary | ICD-10-CM

## 2024-03-11 HISTORY — DX: Cerebral infarction, unspecified: I63.9

## 2024-03-11 HISTORY — DX: Chronic obstructive pulmonary disease, unspecified: J44.9

## 2024-03-11 HISTORY — DX: Disorder of kidney and ureter, unspecified: N28.9

## 2024-03-11 HISTORY — DX: Heart failure, unspecified: I50.9

## 2024-03-11 HISTORY — DX: Occlusion and stenosis of unspecified carotid artery: I65.29

## 2024-03-11 LAB — BASIC METABOLIC PANEL WITH GFR
Anion gap: 12 (ref 5–15)
BUN: 32 mg/dL — ABNORMAL HIGH (ref 8–23)
CO2: 28 mmol/L (ref 22–32)
Calcium: 9.6 mg/dL (ref 8.9–10.3)
Chloride: 97 mmol/L — ABNORMAL LOW (ref 98–111)
Creatinine, Ser: 1.12 mg/dL — ABNORMAL HIGH (ref 0.44–1.00)
GFR, Estimated: 49 mL/min — ABNORMAL LOW (ref >60)
Glucose, Bld: 140 mg/dL — ABNORMAL HIGH (ref 70–99)
Potassium: 3.9 mmol/L (ref 3.5–5.1)
Sodium: 137 mmol/L (ref 135–145)

## 2024-03-11 LAB — CBC WITH DIFFERENTIAL/PLATELET
Abs Immature Granulocytes: 0.02 K/uL (ref 0.00–0.07)
Basophils Absolute: 0 K/uL (ref 0.0–0.1)
Basophils Relative: 1 %
Eosinophils Absolute: 0.2 K/uL (ref 0.0–0.5)
Eosinophils Relative: 2 %
HCT: 35 % — ABNORMAL LOW (ref 36.0–46.0)
Hemoglobin: 11.7 g/dL — ABNORMAL LOW (ref 12.0–15.0)
Immature Granulocytes: 0 %
Lymphocytes Relative: 25 %
Lymphs Abs: 1.6 K/uL (ref 0.7–4.0)
MCH: 35.1 pg — ABNORMAL HIGH (ref 26.0–34.0)
MCHC: 33.4 g/dL (ref 30.0–36.0)
MCV: 105.1 fL — ABNORMAL HIGH (ref 80.0–100.0)
Monocytes Absolute: 0.4 K/uL (ref 0.1–1.0)
Monocytes Relative: 7 %
Neutro Abs: 4 K/uL (ref 1.7–7.7)
Neutrophils Relative %: 65 %
Platelets: 221 K/uL (ref 150–400)
RBC: 3.33 MIL/uL — ABNORMAL LOW (ref 3.87–5.11)
RDW: 13.9 % (ref 11.5–15.5)
WBC: 6.2 K/uL (ref 4.0–10.5)
nRBC: 0 % (ref 0.0–0.2)

## 2024-03-11 NOTE — ED Provider Notes (Signed)
 [facility] Provider Note    Event Date/Time   First MD Initiated Contact with Patient 03/11/24 2024     (approximate)   History   Hypertension   HPI  Brooke Davis is a 83 y.o. female with a history of atrial fibrillation, heart failure, type 2 diabetes, CVA, carotid stenosis, anxiety, depression who presents with elevated blood pressure.  The patient is scheduled for a carotid stent on 8/25.  She has been monitoring her blood pressure and noted elevated blood pressure readings earlier in the week.  She was instructed by her primary care provider to double the doses of her blood pressure medications.  She started taking the double doses 2 days ago but was still getting intermittent elevated readings as high as 200 systolic.  The patient denies any associated symptoms.  She has no headache, chest pain, neck pain, weakness or numbness in her arms or legs, difficulty breathing or other acute symptoms.  I reviewed the past medical records.  The patient was admitted to the hospitalist service that month with an acute CVA.  She was given TNK.  She was previously not on anticoagulation but was restarted on Eliquis  and aspirin .    Physical Exam   Triage Vital Signs: ED Triage Vitals  Encounter Vitals Group     BP 03/11/24 1725 (!) 175/99     Girls Systolic BP Percentile --      Girls Diastolic BP Percentile --      Boys Systolic BP Percentile --      Boys Diastolic BP Percentile --      Pulse Rate 03/11/24 1725 81     Resp 03/11/24 1725 20     Temp 03/11/24 1725 98.2 F (36.8 C)     Temp Source 03/11/24 1725 Oral     SpO2 03/11/24 1725 100 %     Weight 03/11/24 1728 189 lb 9.5 oz (86 kg)     Height 03/11/24 1728 5' 2 (1.575 m)     Head Circumference --      Peak Flow --      Pain Score 03/11/24 1726 3     Pain Loc --      Pain Education --      Exclude from Growth Chart --     Most recent vital signs: Vitals:   03/11/24 2015 03/11/24 2030  BP:  (!) 181/51 (!) 161/55  Pulse: 60 (!) 58  Resp: 16 16  Temp:  98 F (36.7 C)  SpO2: 100% 98%     General: Awake, no distress.  CV:  Good peripheral perfusion.  Resp:  Normal effort.  Abd:  No distention.  Other:  No peripheral edema.  No midline cervical spinal tenderness.  EOMI.  PERRLA.  No photophobia.  Normal speech.  No facial droop.  Motor intact in all extremities.   ED Results / Procedures / Treatments   Labs (all labs ordered are listed, but only abnormal results are displayed) Labs Reviewed  CBC WITH DIFFERENTIAL/PLATELET - Abnormal; Notable for the following components:      Result Value   RBC 3.33 (*)    Hemoglobin 11.7 (*)    HCT 35.0 (*)    MCV 105.1 (*)    MCH 35.1 (*)    All other components within normal limits  BASIC METABOLIC PANEL WITH GFR - Abnormal; Notable for the following components:   Chloride 97 (*)    Glucose, Bld 140 (*)    BUN  32 (*)    Creatinine, Ser 1.12 (*)    GFR, Estimated 49 (*)    All other components within normal limits     EKG    RADIOLOGY    PROCEDURES:  Critical Care performed: No  Procedures   MEDICATIONS ORDERED IN ED: Medications - No data to display   IMPRESSION / MDM / ASSESSMENT AND PLAN / ED COURSE  I reviewed the triage vital signs and the nursing notes.  83 year old female with PMH as noted above presents with persistent elevated blood pressure readings at home despite being instructed to double her blood pressure medications over the last couple days.  However over the last several hours while she was waiting to be seen, the blood pressure has significantly improved, 140s systolic when I was in the room.  The patient has been asymptomatic throughout this time.  Differential diagnosis includes, but is not limited to, asymptomatic hypertension, less likely hypertensive emergency.  The patient has no signs or symptoms of end organ dysfunction.  BMP and CBC were obtained from triage and show no concerning  ccute findings.  Renal function is slightly worsened since her most recent labs a month ago, but generally stable from her recent admission.  There is no indication for cardiac workup.  At this time, given the reassuring labs, lack of any symptoms, and improved blood pressure, the patient stable for discharge home.  I gave strict return precautions, and she expressed understanding.  Patient's presentation is most consistent with acute complicated illness / injury requiring diagnostic workup.  The patient is on the cardiac monitor to evaluate for evidence of arrhythmia and/or significant heart rate changes.  FINAL CLINICAL IMPRESSION(S) / ED DIAGNOSES   Final diagnoses:  Hypertension, unspecified type     Rx / DC Orders   ED Discharge Orders     None        Note:  This document was prepared using Dragon voice recognition software and may include unintentional dictation errors.    Jacolyn Pae, MD 03/11/24 2116

## 2024-03-11 NOTE — Discharge Instructions (Signed)
 Continue to monitor blood pressure over the next day.  Take your medications as instructed by your primary care provider.  Return to the ER for new, worsening, or persistent severe elevated blood pressure readings especially over 200 on the top number or 120 on the bottom number, chest pain, severe headache, dizziness, or any other new or worsening symptoms that concern you.

## 2024-03-11 NOTE — ED Triage Notes (Signed)
 Pt to ED with family member for high BP. BP has been 180s/78 to 200/100 last couple days. PCP told them to double her Losartan  and metoprolol  doses.  Pt is scheduled for R carotid artery stent this coming Monday. Hx strokes.  Pt denies HA and blurry vision. Skin dry, respirations unlabored.  Pt did not take Eliquis  yesterday. Supposed to be off Eliquis  for 3 days before upcoming procedure.

## 2024-03-13 ENCOUNTER — Encounter: Payer: Self-pay | Admitting: Vascular Surgery

## 2024-03-13 ENCOUNTER — Other Ambulatory Visit: Payer: Self-pay

## 2024-03-13 ENCOUNTER — Encounter
Admission: RE | Disposition: A | Discharge status: Home / Self Care | Payer: Self-pay | Source: Home / Self Care | Attending: Vascular Surgery

## 2024-03-13 ENCOUNTER — Inpatient Hospital Stay
Admission: RE | Admit: 2024-03-13 | Discharge: 2024-03-17 | DRG: 034 | Disposition: A | Discharge status: Home Health | Attending: Internal Medicine | Admitting: Internal Medicine

## 2024-03-13 DIAGNOSIS — E1122 Type 2 diabetes mellitus with diabetic chronic kidney disease: Secondary | ICD-10-CM | POA: Diagnosis present

## 2024-03-13 DIAGNOSIS — E1151 Type 2 diabetes mellitus with diabetic peripheral angiopathy without gangrene: Secondary | ICD-10-CM | POA: Diagnosis not present

## 2024-03-13 DIAGNOSIS — Z96641 Presence of right artificial hip joint: Secondary | ICD-10-CM | POA: Diagnosis present

## 2024-03-13 DIAGNOSIS — I6389 Other cerebral infarction: Secondary | ICD-10-CM | POA: Diagnosis not present

## 2024-03-13 DIAGNOSIS — J449 Chronic obstructive pulmonary disease, unspecified: Secondary | ICD-10-CM | POA: Diagnosis present

## 2024-03-13 DIAGNOSIS — Z8673 Personal history of transient ischemic attack (TIA), and cerebral infarction without residual deficits: Secondary | ICD-10-CM

## 2024-03-13 DIAGNOSIS — G9340 Encephalopathy, unspecified: Secondary | ICD-10-CM | POA: Diagnosis not present

## 2024-03-13 DIAGNOSIS — Z79899 Other long term (current) drug therapy: Secondary | ICD-10-CM

## 2024-03-13 DIAGNOSIS — Z91048 Other nonmedicinal substance allergy status: Secondary | ICD-10-CM

## 2024-03-13 DIAGNOSIS — I5032 Chronic diastolic (congestive) heart failure: Secondary | ICD-10-CM | POA: Diagnosis present

## 2024-03-13 DIAGNOSIS — I6521 Occlusion and stenosis of right carotid artery: Secondary | ICD-10-CM | POA: Diagnosis present

## 2024-03-13 DIAGNOSIS — Z882 Allergy status to sulfonamides status: Secondary | ICD-10-CM

## 2024-03-13 DIAGNOSIS — Z9842 Cataract extraction status, left eye: Secondary | ICD-10-CM

## 2024-03-13 DIAGNOSIS — I2510 Atherosclerotic heart disease of native coronary artery without angina pectoris: Secondary | ICD-10-CM | POA: Diagnosis present

## 2024-03-13 DIAGNOSIS — I6789 Other cerebrovascular disease: Secondary | ICD-10-CM | POA: Diagnosis present

## 2024-03-13 DIAGNOSIS — Z87891 Personal history of nicotine dependence: Secondary | ICD-10-CM

## 2024-03-13 DIAGNOSIS — I252 Old myocardial infarction: Secondary | ICD-10-CM

## 2024-03-13 DIAGNOSIS — I6340 Cerebral infarction due to embolism of unspecified cerebral artery: Secondary | ICD-10-CM | POA: Diagnosis not present

## 2024-03-13 DIAGNOSIS — Q2549 Other congenital malformations of aorta: Secondary | ICD-10-CM

## 2024-03-13 DIAGNOSIS — E785 Hyperlipidemia, unspecified: Secondary | ICD-10-CM | POA: Diagnosis present

## 2024-03-13 DIAGNOSIS — Z8249 Family history of ischemic heart disease and other diseases of the circulatory system: Secondary | ICD-10-CM | POA: Diagnosis not present

## 2024-03-13 DIAGNOSIS — E119 Type 2 diabetes mellitus without complications: Secondary | ICD-10-CM | POA: Diagnosis not present

## 2024-03-13 DIAGNOSIS — I771 Stricture of artery: Secondary | ICD-10-CM | POA: Diagnosis present

## 2024-03-13 DIAGNOSIS — Z880 Allergy status to penicillin: Secondary | ICD-10-CM

## 2024-03-13 DIAGNOSIS — I482 Chronic atrial fibrillation, unspecified: Secondary | ICD-10-CM | POA: Diagnosis present

## 2024-03-13 DIAGNOSIS — N1831 Chronic kidney disease, stage 3a: Secondary | ICD-10-CM | POA: Diagnosis present

## 2024-03-13 DIAGNOSIS — R569 Unspecified convulsions: Secondary | ICD-10-CM | POA: Diagnosis not present

## 2024-03-13 DIAGNOSIS — Z811 Family history of alcohol abuse and dependence: Secondary | ICD-10-CM

## 2024-03-13 DIAGNOSIS — G9341 Metabolic encephalopathy: Secondary | ICD-10-CM | POA: Diagnosis not present

## 2024-03-13 DIAGNOSIS — Z888 Allergy status to other drugs, medicaments and biological substances status: Secondary | ICD-10-CM

## 2024-03-13 DIAGNOSIS — Z7902 Long term (current) use of antithrombotics/antiplatelets: Secondary | ICD-10-CM | POA: Diagnosis not present

## 2024-03-13 DIAGNOSIS — E1165 Type 2 diabetes mellitus with hyperglycemia: Secondary | ICD-10-CM | POA: Diagnosis present

## 2024-03-13 DIAGNOSIS — Z823 Family history of stroke: Secondary | ICD-10-CM

## 2024-03-13 DIAGNOSIS — I130 Hypertensive heart and chronic kidney disease with heart failure and stage 1 through stage 4 chronic kidney disease, or unspecified chronic kidney disease: Secondary | ICD-10-CM | POA: Diagnosis present

## 2024-03-13 DIAGNOSIS — I480 Paroxysmal atrial fibrillation: Secondary | ICD-10-CM | POA: Diagnosis present

## 2024-03-13 DIAGNOSIS — M19041 Primary osteoarthritis, right hand: Secondary | ICD-10-CM | POA: Diagnosis present

## 2024-03-13 DIAGNOSIS — Z6372 Alcoholism and drug addiction in family: Secondary | ICD-10-CM

## 2024-03-13 DIAGNOSIS — F419 Anxiety disorder, unspecified: Secondary | ICD-10-CM | POA: Diagnosis present

## 2024-03-13 DIAGNOSIS — D631 Anemia in chronic kidney disease: Secondary | ICD-10-CM | POA: Diagnosis present

## 2024-03-13 DIAGNOSIS — N179 Acute kidney failure, unspecified: Secondary | ICD-10-CM | POA: Diagnosis not present

## 2024-03-13 DIAGNOSIS — Z9889 Other specified postprocedural states: Secondary | ICD-10-CM | POA: Diagnosis not present

## 2024-03-13 DIAGNOSIS — Z90711 Acquired absence of uterus with remaining cervical stump: Secondary | ICD-10-CM

## 2024-03-13 DIAGNOSIS — Z96651 Presence of right artificial knee joint: Secondary | ICD-10-CM | POA: Diagnosis present

## 2024-03-13 DIAGNOSIS — Z7982 Long term (current) use of aspirin: Secondary | ICD-10-CM | POA: Diagnosis not present

## 2024-03-13 DIAGNOSIS — Z974 Presence of external hearing-aid: Secondary | ICD-10-CM

## 2024-03-13 DIAGNOSIS — R001 Bradycardia, unspecified: Secondary | ICD-10-CM | POA: Diagnosis not present

## 2024-03-13 DIAGNOSIS — Z833 Family history of diabetes mellitus: Secondary | ICD-10-CM

## 2024-03-13 DIAGNOSIS — E876 Hypokalemia: Secondary | ICD-10-CM | POA: Diagnosis not present

## 2024-03-13 DIAGNOSIS — Z9841 Cataract extraction status, right eye: Secondary | ICD-10-CM

## 2024-03-13 DIAGNOSIS — Z883 Allergy status to other anti-infective agents status: Secondary | ICD-10-CM

## 2024-03-13 DIAGNOSIS — M19042 Primary osteoarthritis, left hand: Secondary | ICD-10-CM | POA: Diagnosis present

## 2024-03-13 DIAGNOSIS — D649 Anemia, unspecified: Secondary | ICD-10-CM | POA: Diagnosis not present

## 2024-03-13 DIAGNOSIS — Z7901 Long term (current) use of anticoagulants: Secondary | ICD-10-CM | POA: Diagnosis not present

## 2024-03-13 DIAGNOSIS — M51369 Other intervertebral disc degeneration, lumbar region without mention of lumbar back pain or lower extremity pain: Secondary | ICD-10-CM | POA: Diagnosis present

## 2024-03-13 DIAGNOSIS — Z95828 Presence of other vascular implants and grafts: Secondary | ICD-10-CM | POA: Diagnosis not present

## 2024-03-13 DIAGNOSIS — H9193 Unspecified hearing loss, bilateral: Secondary | ICD-10-CM | POA: Diagnosis present

## 2024-03-13 HISTORY — PX: CAROTID PTA/STENT INTERVENTION: CATH118231

## 2024-03-13 LAB — GLUCOSE, CAPILLARY: Glucose-Capillary: 124 mg/dL — ABNORMAL HIGH (ref 70–99)

## 2024-03-13 LAB — MRSA NEXT GEN BY PCR, NASAL: MRSA by PCR Next Gen: NOT DETECTED

## 2024-03-13 LAB — BUN: BUN: 29 mg/dL — ABNORMAL HIGH (ref 8–23)

## 2024-03-13 LAB — CREATININE, SERUM
Creatinine, Ser: 1.1 mg/dL — ABNORMAL HIGH (ref 0.44–1.00)
GFR, Estimated: 50 mL/min — ABNORMAL LOW (ref >60)

## 2024-03-13 SURGERY — CAROTID PTA/STENT INTERVENTION
Anesthesia: Moderate Sedation | Laterality: Right

## 2024-03-13 MED ORDER — ASPIRIN 81 MG PO CHEW
81.0000 mg | CHEWABLE_TABLET | Freq: Every day | ORAL | Status: DC
  Administered 2024-03-14: 81 mg via ORAL
  Filled 2024-03-13: qty 1

## 2024-03-13 MED ORDER — METOPROLOL TARTRATE 50 MG PO TABS
25.0000 mg | ORAL_TABLET | Freq: Two times a day (BID) | ORAL | Status: DC
  Administered 2024-03-13: 25 mg via ORAL
  Filled 2024-03-13: qty 1

## 2024-03-13 MED ORDER — MIDAZOLAM HCL 2 MG/ML PO SYRP: 8.0000 mg | ORAL_SOLUTION | Freq: Once | ORAL | Status: DC | PRN

## 2024-03-13 MED ORDER — LOSARTAN POTASSIUM 50 MG PO TABS: 50.0000 mg | ORAL_TABLET | Freq: Every day | ORAL | Status: DC

## 2024-03-13 MED ORDER — POTASSIUM CHLORIDE CRYS ER 20 MEQ PO TBCR: 40.0000 meq | EXTENDED_RELEASE_TABLET | Freq: Every day | ORAL | Status: DC | PRN

## 2024-03-13 MED ORDER — ATROPINE SULFATE 1 MG/10ML IJ SOSY
PREFILLED_SYRINGE | INTRAMUSCULAR | Status: AC
  Filled 2024-03-13: qty 20

## 2024-03-13 MED ORDER — SODIUM CHLORIDE 0.9 % IV SOLN: INTRAVENOUS | Status: DC

## 2024-03-13 MED ORDER — HEPARIN (PORCINE) IN NACL 1000-0.9 UT/500ML-% IV SOLN
INTRAVENOUS | Status: DC | PRN
Start: 2024-03-13 — End: 2024-03-13
  Administered 2024-03-13: 2000 mL

## 2024-03-13 MED ORDER — HEPARIN SODIUM (PORCINE) 1000 UNIT/ML IJ SOLN
INTRAMUSCULAR | Status: AC
  Filled 2024-03-13: qty 10

## 2024-03-13 MED ORDER — ONDANSETRON HCL 4 MG/2ML IJ SOLN: 4.0000 mg | Freq: Four times a day (QID) | INTRAMUSCULAR | Status: DC | PRN

## 2024-03-13 MED ORDER — LIDOCAINE-EPINEPHRINE (PF) 1 %-1:200000 IJ SOLN
INTRAMUSCULAR | Status: DC | PRN
  Administered 2024-03-13: 10 mL

## 2024-03-13 MED ORDER — MIDAZOLAM HCL 2 MG/2ML IJ SOLN
INTRAMUSCULAR | Status: DC | PRN
  Administered 2024-03-13: 1 mg via INTRAVENOUS

## 2024-03-13 MED ORDER — TORSEMIDE 20 MG PO TABS
20.0000 mg | ORAL_TABLET | Freq: Every day | ORAL | Status: DC
  Administered 2024-03-14: 20 mg via ORAL
  Filled 2024-03-13: qty 1

## 2024-03-13 MED ORDER — DIPHENHYDRAMINE HCL 50 MG/ML IJ SOLN: 50.0000 mg | Freq: Once | INTRAMUSCULAR | Status: DC | PRN

## 2024-03-13 MED ORDER — ATROPINE SULFATE 1 MG/10ML IJ SOSY
PREFILLED_SYRINGE | INTRAMUSCULAR | Status: DC | PRN
Start: 2024-03-13 — End: 2024-03-13
  Administered 2024-03-13: 1 mg via INTRAVENOUS

## 2024-03-13 MED ORDER — CEFAZOLIN SODIUM-DEXTROSE 2-4 GM/100ML-% IV SOLN
2.0000 g | INTRAVENOUS | Status: AC
Start: 2024-03-13 — End: 2024-03-13
  Administered 2024-03-13: 2 g via INTRAVENOUS

## 2024-03-13 MED ORDER — ONDANSETRON HCL 4 MG/2ML IJ SOLN
4.0000 mg | Freq: Four times a day (QID) | INTRAMUSCULAR | Status: DC | PRN
  Filled 2024-03-13: qty 2

## 2024-03-13 MED ORDER — ACETAMINOPHEN 325 MG PO TABS
325.0000 mg | ORAL_TABLET | ORAL | Status: DC | PRN
  Administered 2024-03-13 – 2024-03-16 (×4): 650 mg via ORAL
  Filled 2024-03-13 (×4): qty 2

## 2024-03-13 MED ORDER — FAMOTIDINE 20 MG PO TABS: 40.0000 mg | ORAL_TABLET | Freq: Once | ORAL | Status: DC | PRN

## 2024-03-13 MED ORDER — PHENYLEPHRINE 80 MCG/ML (10ML) SYRINGE FOR IV PUSH (FOR BLOOD PRESSURE SUPPORT)
PREFILLED_SYRINGE | INTRAVENOUS | Status: AC
  Filled 2024-03-13: qty 10

## 2024-03-13 MED ORDER — METOPROLOL TARTRATE 5 MG/5ML IV SOLN: 2.5000 mg | INTRAVENOUS | Status: DC | PRN

## 2024-03-13 MED ORDER — FENTANYL CITRATE (PF) 100 MCG/2ML IJ SOLN
INTRAMUSCULAR | Status: DC | PRN
  Administered 2024-03-13: 25 ug via INTRAVENOUS

## 2024-03-13 MED ORDER — TRAMADOL HCL 50 MG PO TABS
50.0000 mg | ORAL_TABLET | ORAL | Status: DC | PRN
  Administered 2024-03-13: 50 mg via ORAL
  Filled 2024-03-13: qty 1

## 2024-03-13 MED ORDER — IODIXANOL 320 MG/ML IV SOLN
INTRAVENOUS | Status: DC | PRN
  Administered 2024-03-13: 55 mL

## 2024-03-13 MED ORDER — ALPRAZOLAM 0.25 MG PO TABS
0.2500 mg | ORAL_TABLET | Freq: Two times a day (BID) | ORAL | Status: DC | PRN
  Administered 2024-03-14 – 2024-03-16 (×2): 0.25 mg via ORAL
  Filled 2024-03-13 (×2): qty 1

## 2024-03-13 MED ORDER — METHYLPREDNISOLONE SODIUM SUCC 125 MG IJ SOLR: 125.0000 mg | Freq: Once | INTRAMUSCULAR | Status: DC | PRN

## 2024-03-13 MED ORDER — ACETAMINOPHEN 325 MG PO TABS: 650.0000 mg | ORAL_TABLET | Freq: Four times a day (QID) | ORAL | Status: DC | PRN

## 2024-03-13 MED ORDER — FENTANYL CITRATE (PF) 100 MCG/2ML IJ SOLN
INTRAMUSCULAR | Status: AC
  Filled 2024-03-13: qty 2

## 2024-03-13 MED ORDER — ATORVASTATIN CALCIUM 20 MG PO TABS
80.0000 mg | ORAL_TABLET | Freq: Every day | ORAL | Status: DC
  Administered 2024-03-13 – 2024-03-17 (×5): 80 mg via ORAL
  Filled 2024-03-13 (×2): qty 4
  Filled 2024-03-13 (×3): qty 1

## 2024-03-13 MED ORDER — HEPARIN SODIUM (PORCINE) 1000 UNIT/ML IJ SOLN
INTRAMUSCULAR | Status: DC | PRN
  Administered 2024-03-13: 7000 [IU] via INTRAVENOUS

## 2024-03-13 MED ORDER — ORAL CARE MOUTH RINSE: 15.0000 mL | OROMUCOSAL | Status: DC | PRN

## 2024-03-13 MED ORDER — POTASSIUM CHLORIDE CRYS ER 10 MEQ PO TBCR
10.0000 meq | EXTENDED_RELEASE_TABLET | Freq: Every day | ORAL | Status: DC
  Administered 2024-03-15 – 2024-03-17 (×3): 10 meq via ORAL
  Filled 2024-03-13 (×4): qty 1

## 2024-03-13 MED ORDER — CITALOPRAM HYDROBROMIDE 20 MG PO TABS
20.0000 mg | ORAL_TABLET | Freq: Every day | ORAL | Status: DC
  Administered 2024-03-14 – 2024-03-17 (×4): 20 mg via ORAL
  Filled 2024-03-13 (×4): qty 1

## 2024-03-13 MED ORDER — CLOPIDOGREL BISULFATE 75 MG PO TABS
75.0000 mg | ORAL_TABLET | Freq: Every day | ORAL | Status: DC
  Administered 2024-03-13 – 2024-03-17 (×5): 75 mg via ORAL
  Filled 2024-03-13 (×5): qty 1

## 2024-03-13 MED ORDER — CEFAZOLIN SODIUM-DEXTROSE 2-4 GM/100ML-% IV SOLN
2.0000 g | Freq: Three times a day (TID) | INTRAVENOUS | Status: AC
  Administered 2024-03-13 – 2024-03-14 (×2): 2 g via INTRAVENOUS
  Filled 2024-03-13 (×3): qty 100

## 2024-03-13 MED ORDER — MORPHINE SULFATE (PF) 2 MG/ML IV SOLN: 2.0000 mg | INTRAVENOUS | Status: DC | PRN

## 2024-03-13 MED ORDER — PHENYLEPHRINE HCL-NACL 20-0.9 MG/250ML-% IV SOLN
INTRAVENOUS | Status: AC
  Filled 2024-03-13: qty 250

## 2024-03-13 MED ORDER — MIDAZOLAM HCL 5 MG/5ML IJ SOLN
INTRAMUSCULAR | Status: AC
  Filled 2024-03-13: qty 5

## 2024-03-13 MED ORDER — ALLOPURINOL 100 MG PO TABS
300.0000 mg | ORAL_TABLET | Freq: Every day | ORAL | Status: DC
  Administered 2024-03-14 – 2024-03-17 (×4): 300 mg via ORAL
  Filled 2024-03-13: qty 1
  Filled 2024-03-13: qty 3
  Filled 2024-03-13 (×2): qty 1

## 2024-03-13 MED ORDER — HYDRALAZINE HCL 20 MG/ML IJ SOLN: 5.0000 mg | INTRAMUSCULAR | Status: DC | PRN

## 2024-03-13 MED ORDER — APIXABAN 5 MG PO TABS
5.0000 mg | ORAL_TABLET | Freq: Two times a day (BID) | ORAL | Status: DC
  Administered 2024-03-14: 5 mg via ORAL
  Filled 2024-03-13: qty 1

## 2024-03-13 MED ORDER — HYDROMORPHONE HCL 1 MG/ML IJ SOLN: 1.0000 mg | Freq: Once | INTRAMUSCULAR | Status: DC | PRN

## 2024-03-13 MED ORDER — MELATONIN 5 MG PO TABS
5.0000 mg | ORAL_TABLET | Freq: Every day | ORAL | Status: DC
  Administered 2024-03-13: 5 mg via ORAL
  Filled 2024-03-13: qty 1

## 2024-03-13 MED ORDER — ACETAMINOPHEN 325 MG RE SUPP: 325.0000 mg | RECTAL | Status: DC | PRN

## 2024-03-13 MED ORDER — SODIUM CHLORIDE 0.9 % IV SOLN: 500.0000 mL | Freq: Once | INTRAVENOUS | Status: DC | PRN

## 2024-03-13 MED ORDER — LABETALOL HCL 5 MG/ML IV SOLN: 10.0000 mg | INTRAVENOUS | Status: DC | PRN

## 2024-03-13 MED ORDER — PHENOL 1.4 % MT LIQD
1.0000 | OROMUCOSAL | Status: DC | PRN
Start: 2024-03-13 — End: 2024-03-14

## 2024-03-13 SURGICAL SUPPLY — 17 items
BALLOON VTRAC 4.5X30X135 (BALLOONS) IMPLANT
CATH ANGIO 5F 100CM .035 PIG (CATHETERS) IMPLANT
CATH BEACON 5 .035 100 H1 TIP (CATHETERS) IMPLANT
COVER DRAPE FLUORO 36X44 (DRAPES) IMPLANT
DEVICE EMBOSHIELD NAV6 4.0-7.0 (FILTER) IMPLANT
DEVICE PRESTO INFLATION (MISCELLANEOUS) IMPLANT
DEVICE STARCLOSE SE CLOSURE (Vascular Products) IMPLANT
GLIDEWIRE ANGLED SS 035X260CM (WIRE) IMPLANT
KIT CAROTID MANIFOLD (MISCELLANEOUS) IMPLANT
PACK ANGIOGRAPHY (CUSTOM PROCEDURE TRAY) ×1 IMPLANT
SHEATH BRITE TIP 6FRX11 (SHEATH) IMPLANT
SHEATH SHUTTLE 6FRX80 (SHEATH) IMPLANT
STENT XACT CAR 9-7X40X136 (Permanent Stent) IMPLANT
SUT MNCRL AB 4-0 PS2 18 (SUTURE) IMPLANT
SYR MEDRAD MARK 7 150ML (SYRINGE) IMPLANT
WIRE G VAS 035X260 STIFF (WIRE) IMPLANT
WIRE J 3MM .035X145CM (WIRE) IMPLANT

## 2024-03-13 NOTE — Op Note (Signed)
 OPERATIVE NOTE DATE: 03/13/2024  PROCEDURE:  Ultrasound guidance for vascular access right femoral artery  Placement of a 9 mm proximal, 7 mm distal, 4 cm long exact stent with the use of the NAV-6 embolic protection device in the right carotid artery  PRE-OPERATIVE DIAGNOSIS: 1.  Symptomatic right carotid artery stenosis. 2.  Previous left carotid stent  POST-OPERATIVE DIAGNOSIS:  Same as above  SURGEON: Selinda Gu, MD  ASSISTANT(S): None  ANESTHESIA: local/MCS  ESTIMATED BLOOD LOSS: 10 cc  CONTRAST: 55 cc  FLUORO TIME: 4 minutes  MODERATE CONSCIOUS SEDATION TIME:  Approximately 39 minutes using 1 mg of Versed  and 25 mcg of Fentanyl   FINDING(S): 1.   Ulcerated 70% right carotid artery stenosis  SPECIMEN(S):   none  INDICATIONS:   Patient is a 83 y.o. female who presents with multiple TIAs as well as a previous stroke about 2 months ago in the right hemisphere with moderate to high-grade right carotid artery stenosis.  The patient has previously had a left carotid stent and did well and has a litany of medical issues making her a poor candidate for general anesthesia and carotid artery stenting was felt to be preferred to endarterectomy for that reason. I have completed Share Decision Making with MIKHAYLA PHILLIS prior to surgery.  Conversations included: -Discussion of all treatment options including carotid endarterectomy (CEA), CAS (which includes transcarotid artery revascularization (TCAR)), and optimal medical therapy (OMT)). -Explanation of risks and benefits for each option specific to Hargis VEAR Lingafelter's clinical situation. -Integration of clinical guidelines as it relates to the patient's history and co-morbidities -Discussion and incorporation of Lori-Ann H Harnden and their personal preferences and priorities in choosing a treatment plan.  If patient was unable to participate in Shared Decision Making this process was done with both the patient and her daughters   Risks and benefits were discussed and informed consent was obtained.   DESCRIPTION: After obtaining full informed written consent, the patient was brought back to the vascular suite and placed supine upon the table.  The patient received IV antibiotics prior to induction. Moderate conscious sedation was administered during a face to face encounter with the patient throughout the procedure with my supervision of the RN administering medicines and monitoring the patients vital signs and mental status throughout from the start of the procedure until the patient was taken to the recovery room.  After obtaining adequate anesthesia, the patient was prepped and draped in the standard fashion.   The right femoral artery was visualized with ultrasound and found to be widely patent. It was then accessed under direct ultrasound guidance without difficulty with a Seldinger needle. A permanent image was recorded. A J-wire was placed and we then placed a 6 French sheath. The patient was then heparinized and a total of 7000 units of intravenous heparin  were given and an ACT was checked to confirm successful anticoagulation. A pigtail catheter was then placed into the ascending aorta. This showed a bovine type II aortic arch without proximal stenosis. I then selectively cannulated the right innominate artery without difficulty with a headhunter catheter and advanced into the mid right common carotid artery.  Cervical and cerebral carotid angiography was then performed. There were no obvious intracranial filling defects with decent flow in both the anterior and middle cerebral artery. The carotid bifurcation demonstrated an irregular ulcerative lesion with approximately 70% stenosis.  I then advanced into the external carotid artery with a Glidewire and the headhunter catheter and then exchanged for the  Amplatz Super Stiff wire. Over the Amplatz Super Stiff wire, a 6 [place] shuttle sheath was placed into the mid common carotid  artery. I then used the NAV-6  Embolic protection device and crossed the lesion and parked this in the distal internal carotid artery at the base of the skull.  I then selected a 9 mm proximal, 7 mm distal, 4 cm long exact stent. This was deployed across the lesion encompassing it in its entirety. A 4.5 mm diameter by 3 cm length balloon was used to post dilate the stent. Only about a 15-20% residual stenosis was present after angioplasty. Completion angiogram showed normal intracranial filling without new defects. At this point I elected to terminate the procedure. The sheath was removed and StarClose closure device was deployed in the right femoral artery with excellent hemostatic result. The patient was taken to the recovery room in stable condition having tolerated the procedure well.  COMPLICATIONS: none  CONDITION: stable  Selinda Gu 03/13/2024 12:27 PM   This note was created with Dragon Medical transcription system. Any errors in dictation are purely unintentional.

## 2024-03-13 NOTE — Plan of Care (Signed)

## 2024-03-13 NOTE — Plan of Care (Signed)
  Problem: Education: Goal: Knowledge of General Education information will improve Description: Including pain rating scale, medication(s)/side effects and non-pharmacologic comfort measures 03/13/2024 1627 by Eluterio Corean BIRCH, RN Outcome: Progressing 03/13/2024 1624 by Eluterio Corean BIRCH, RN Outcome: Progressing   Problem: Health Behavior/Discharge Planning: Goal: Ability to manage health-related needs will improve 03/13/2024 1627 by Eluterio Corean BIRCH, RN Outcome: Progressing 03/13/2024 1624 by Eluterio Corean BIRCH, RN Outcome: Progressing   Problem: Clinical Measurements: Goal: Ability to maintain clinical measurements within normal limits will improve 03/13/2024 1627 by Eluterio Corean BIRCH, RN Outcome: Progressing 03/13/2024 1624 by Eluterio Corean BIRCH, RN Outcome: Progressing Goal: Will remain free from infection 03/13/2024 1627 by Eluterio Corean BIRCH, RN Outcome: Progressing 03/13/2024 1624 by Eluterio Corean BIRCH, RN Outcome: Progressing Goal: Diagnostic test results will improve 03/13/2024 1627 by Eluterio Corean BIRCH, RN Outcome: Progressing 03/13/2024 1624 by Eluterio Corean BIRCH, RN Outcome: Progressing Goal: Respiratory complications will improve 03/13/2024 1627 by Eluterio Corean BIRCH, RN Outcome: Progressing 03/13/2024 1624 by Eluterio Corean BIRCH, RN Outcome: Progressing Goal: Cardiovascular complication will be avoided 03/13/2024 1627 by Eluterio Corean BIRCH, RN Outcome: Progressing 03/13/2024 1624 by Eluterio Corean BIRCH, RN Outcome: Progressing   Problem: Activity: Goal: Risk for activity intolerance will decrease 03/13/2024 1627 by Eluterio Corean BIRCH, RN Outcome: Progressing 03/13/2024 1624 by Eluterio Corean BIRCH, RN Outcome: Progressing   Problem: Nutrition: Goal: Adequate nutrition will be maintained 03/13/2024 1627 by Eluterio Corean BIRCH, RN Outcome: Progressing 03/13/2024 1624 by Eluterio Corean BIRCH, RN Outcome: Progressing   Problem:  Coping: Goal: Level of anxiety will decrease 03/13/2024 1627 by Eluterio Corean BIRCH, RN Outcome: Progressing 03/13/2024 1624 by Eluterio Corean BIRCH, RN Outcome: Progressing   Problem: Elimination: Goal: Will not experience complications related to bowel motility 03/13/2024 1627 by Eluterio Corean BIRCH, RN Outcome: Progressing 03/13/2024 1624 by Eluterio Corean BIRCH, RN Outcome: Progressing Goal: Will not experience complications related to urinary retention 03/13/2024 1627 by Eluterio Corean BIRCH, RN Outcome: Progressing 03/13/2024 1624 by Eluterio Corean BIRCH, RN Outcome: Progressing   Problem: Pain Managment: Goal: General experience of comfort will improve and/or be controlled 03/13/2024 1627 by Eluterio Corean BIRCH, RN Outcome: Progressing 03/13/2024 1624 by Eluterio Corean BIRCH, RN Outcome: Progressing   Problem: Safety: Goal: Ability to remain free from injury will improve 03/13/2024 1627 by Eluterio Corean BIRCH, RN Outcome: Progressing 03/13/2024 1624 by Eluterio Corean BIRCH, RN Outcome: Progressing   Problem: Skin Integrity: Goal: Risk for impaired skin integrity will decrease 03/13/2024 1627 by Eluterio Corean BIRCH, RN Outcome: Progressing 03/13/2024 1624 by Eluterio Corean BIRCH, RN Outcome: Progressing   Problem: Education: Goal: Knowledge of discharge needs will improve 03/13/2024 1627 by Eluterio Corean BIRCH, RN Outcome: Progressing 03/13/2024 1624 by Eluterio Corean BIRCH, RN Outcome: Progressing   Problem: Clinical Measurements: Goal: Postoperative complications will be avoided or minimized 03/13/2024 1627 by Eluterio Corean BIRCH, RN Outcome: Progressing 03/13/2024 1624 by Eluterio Corean BIRCH, RN Outcome: Progressing   Problem: Respiratory: Goal: Will achieve and/or maintain a regular respiratory rate, without signs or symptoms of dyspnea 03/13/2024 1627 by Eluterio Corean BIRCH, RN Outcome: Progressing 03/13/2024 1624 by Eluterio Corean BIRCH, RN Outcome:  Progressing   Problem: Skin Integrity: Goal: Demonstration of wound healing without infection will improve 03/13/2024 1627 by Eluterio Corean BIRCH, RN Outcome: Progressing 03/13/2024 1624 by Eluterio Corean BIRCH, RN Outcome: Progressing

## 2024-03-13 NOTE — Interval H&P Note (Signed)
 History and Physical Interval Note:  03/13/2024 9:59 AM  [name]  has presented today for surgery, with the diagnosis of R Carotid Stent   ABBOTT    Carotid artery stenosis.  The various methods of treatment have been discussed with the patient and family. After consideration of risks, benefits and other options for treatment, the patient has consented to  Procedure(s): CAROTID PTA/STENT INTERVENTION (Right) as a surgical intervention.  The patient's history has been reviewed, patient examined, no change in status, stable for surgery.  I have reviewed the patient's chart and labs.  Questions were answered to the patient's satisfaction.     Brooke Davis

## 2024-03-14 ENCOUNTER — Inpatient Hospital Stay

## 2024-03-14 DIAGNOSIS — E1151 Type 2 diabetes mellitus with diabetic peripheral angiopathy without gangrene: Secondary | ICD-10-CM | POA: Diagnosis not present

## 2024-03-14 DIAGNOSIS — R569 Unspecified convulsions: Secondary | ICD-10-CM | POA: Diagnosis not present

## 2024-03-14 DIAGNOSIS — D649 Anemia, unspecified: Secondary | ICD-10-CM

## 2024-03-14 DIAGNOSIS — I6521 Occlusion and stenosis of right carotid artery: Secondary | ICD-10-CM | POA: Diagnosis not present

## 2024-03-14 DIAGNOSIS — J449 Chronic obstructive pulmonary disease, unspecified: Secondary | ICD-10-CM | POA: Diagnosis not present

## 2024-03-14 DIAGNOSIS — N179 Acute kidney failure, unspecified: Secondary | ICD-10-CM

## 2024-03-14 DIAGNOSIS — I6340 Cerebral infarction due to embolism of unspecified cerebral artery: Secondary | ICD-10-CM

## 2024-03-14 DIAGNOSIS — Z7901 Long term (current) use of anticoagulants: Secondary | ICD-10-CM

## 2024-03-14 DIAGNOSIS — E119 Type 2 diabetes mellitus without complications: Secondary | ICD-10-CM

## 2024-03-14 DIAGNOSIS — G9340 Encephalopathy, unspecified: Secondary | ICD-10-CM | POA: Diagnosis not present

## 2024-03-14 DIAGNOSIS — E876 Hypokalemia: Secondary | ICD-10-CM

## 2024-03-14 LAB — COMPREHENSIVE METABOLIC PANEL WITH GFR
ALT: 10 U/L (ref 0–44)
AST: 38 U/L (ref 15–41)
Albumin: 3.6 g/dL (ref 3.5–5.0)
Alkaline Phosphatase: 107 U/L (ref 38–126)
Anion gap: 14 (ref 5–15)
BUN: 22 mg/dL (ref 8–23)
CO2: 25 mmol/L (ref 22–32)
Calcium: 9.3 mg/dL (ref 8.9–10.3)
Chloride: 97 mmol/L — ABNORMAL LOW (ref 98–111)
Creatinine, Ser: 1.29 mg/dL — ABNORMAL HIGH (ref 0.44–1.00)
GFR, Estimated: 42 mL/min — ABNORMAL LOW (ref >60)
Glucose, Bld: 170 mg/dL — ABNORMAL HIGH (ref 70–99)
Potassium: 3.9 mmol/L (ref 3.5–5.1)
Sodium: 136 mmol/L (ref 135–145)
Total Bilirubin: 1.3 mg/dL — ABNORMAL HIGH (ref 0.0–1.2)
Total Protein: 7.3 g/dL (ref 6.5–8.1)

## 2024-03-14 LAB — BASIC METABOLIC PANEL WITH GFR
Anion gap: 13 (ref 5–15)
BUN: 26 mg/dL — ABNORMAL HIGH (ref 8–23)
CO2: 27 mmol/L (ref 22–32)
Calcium: 9.1 mg/dL (ref 8.9–10.3)
Chloride: 99 mmol/L (ref 98–111)
Creatinine, Ser: 1.3 mg/dL — ABNORMAL HIGH (ref 0.44–1.00)
GFR, Estimated: 41 mL/min — ABNORMAL LOW (ref >60)
Glucose, Bld: 114 mg/dL — ABNORMAL HIGH (ref 70–99)
Potassium: 3.4 mmol/L — ABNORMAL LOW (ref 3.5–5.1)
Sodium: 139 mmol/L (ref 135–145)

## 2024-03-14 LAB — VITAMIN B12: Vitamin B-12: 410 pg/mL (ref 180–914)

## 2024-03-14 LAB — CBC WITH DIFFERENTIAL/PLATELET
Abs Immature Granulocytes: 0.04 K/uL (ref 0.00–0.07)
Basophils Absolute: 0 K/uL (ref 0.0–0.1)
Basophils Relative: 0 %
Eosinophils Absolute: 0 K/uL (ref 0.0–0.5)
Eosinophils Relative: 0 %
HCT: 34.7 % — ABNORMAL LOW (ref 36.0–46.0)
Hemoglobin: 11.7 g/dL — ABNORMAL LOW (ref 12.0–15.0)
Immature Granulocytes: 0 %
Lymphocytes Relative: 5 %
Lymphs Abs: 0.5 K/uL — ABNORMAL LOW (ref 0.7–4.0)
MCH: 35.5 pg — ABNORMAL HIGH (ref 26.0–34.0)
MCHC: 33.7 g/dL (ref 30.0–36.0)
MCV: 105.2 fL — ABNORMAL HIGH (ref 80.0–100.0)
Monocytes Absolute: 0.3 K/uL (ref 0.1–1.0)
Monocytes Relative: 2 %
Neutro Abs: 10.3 K/uL — ABNORMAL HIGH (ref 1.7–7.7)
Neutrophils Relative %: 93 %
Platelets: 214 K/uL (ref 150–400)
RBC: 3.3 MIL/uL — ABNORMAL LOW (ref 3.87–5.11)
RDW: 14 % (ref 11.5–15.5)
WBC: 11.1 K/uL — ABNORMAL HIGH (ref 4.0–10.5)
nRBC: 0 % (ref 0.0–0.2)

## 2024-03-14 LAB — CBC
HCT: 30.2 % — ABNORMAL LOW (ref 36.0–46.0)
Hemoglobin: 10.1 g/dL — ABNORMAL LOW (ref 12.0–15.0)
MCH: 34.7 pg — ABNORMAL HIGH (ref 26.0–34.0)
MCHC: 33.4 g/dL (ref 30.0–36.0)
MCV: 103.8 fL — ABNORMAL HIGH (ref 80.0–100.0)
Platelets: 190 K/uL (ref 150–400)
RBC: 2.91 MIL/uL — ABNORMAL LOW (ref 3.87–5.11)
RDW: 13.7 % (ref 11.5–15.5)
WBC: 5.5 K/uL (ref 4.0–10.5)
nRBC: 0 % (ref 0.0–0.2)

## 2024-03-14 LAB — BLOOD GAS, ARTERIAL
Acid-Base Excess: 4 mmol/L — ABNORMAL HIGH (ref 0.0–2.0)
Bicarbonate: 29.2 mmol/L — ABNORMAL HIGH (ref 20.0–28.0)
O2 Content: 1 L/min
O2 Saturation: 97.9 %
Patient temperature: 37
pCO2 arterial: 45 mmHg (ref 32–48)
pH, Arterial: 7.42 (ref 7.35–7.45)
pO2, Arterial: 86 mmHg (ref 83–108)

## 2024-03-14 LAB — PROTIME-INR
INR: 1.1 (ref 0.8–1.2)
Prothrombin Time: 15.1 s (ref 11.4–15.2)

## 2024-03-14 LAB — FOLATE: Folate: >46 ng/mL (ref >5.9)

## 2024-03-14 LAB — LACTIC ACID, PLASMA
Lactic Acid, Venous: 2.7 mmol/L (ref 0.5–1.9)
Lactic Acid, Venous: 2.3 mmol/L (ref 0.5–1.9)

## 2024-03-14 LAB — GLUCOSE, CAPILLARY
Glucose-Capillary: 109 mg/dL — ABNORMAL HIGH (ref 70–99)
Glucose-Capillary: 170 mg/dL — ABNORMAL HIGH (ref 70–99)
Glucose-Capillary: 180 mg/dL — ABNORMAL HIGH (ref 70–99)

## 2024-03-14 LAB — MAGNESIUM: Magnesium: 1.6 mg/dL — ABNORMAL LOW (ref 1.7–2.4)

## 2024-03-14 LAB — APTT: aPTT: 28 s (ref 24–36)

## 2024-03-14 LAB — TROPONIN I (HIGH SENSITIVITY): Troponin I (High Sensitivity): 17 ng/L (ref <18)

## 2024-03-14 LAB — PHOSPHORUS: Phosphorus: 4 mg/dL (ref 2.5–4.6)

## 2024-03-14 LAB — POCT ACTIVATED CLOTTING TIME: Activated Clotting Time: 279 s

## 2024-03-14 MED ORDER — MIDAZOLAM HCL 2 MG/2ML IJ SOLN: 2.0000 mg | Freq: Once | INTRAMUSCULAR | Status: AC

## 2024-03-14 MED ORDER — HALOPERIDOL LACTATE 5 MG/ML IJ SOLN: 2.5000 mg | INTRAMUSCULAR | Status: DC

## 2024-03-14 MED ORDER — ONDANSETRON HCL 4 MG/2ML IJ SOLN: 4.0000 mg | Freq: Once | INTRAMUSCULAR | Status: DC

## 2024-03-14 MED ORDER — MIDAZOLAM HCL 2 MG/2ML IJ SOLN
INTRAMUSCULAR | Status: AC
  Filled 2024-03-14: qty 2

## 2024-03-14 MED ORDER — MIDAZOLAM HCL 2 MG/2ML IJ SOLN: 1.0000 mg | INTRAMUSCULAR | Status: AC

## 2024-03-14 MED ORDER — FENTANYL CITRATE PF 50 MCG/ML IJ SOSY: 12.5000 ug | PREFILLED_SYRINGE | Freq: Once | INTRAMUSCULAR | Status: AC

## 2024-03-14 MED ORDER — MIDAZOLAM HCL 2 MG/2ML IJ SOLN
1.0000 mg | Freq: Once | INTRAMUSCULAR | Status: AC
  Administered 2024-03-14: 1 mg via INTRAVENOUS

## 2024-03-14 MED ORDER — POTASSIUM CHLORIDE CRYS ER 20 MEQ PO TBCR
60.0000 meq | EXTENDED_RELEASE_TABLET | Freq: Once | ORAL | Status: AC
  Administered 2024-03-14: 60 meq via ORAL
  Filled 2024-03-14: qty 3

## 2024-03-14 MED ORDER — LEVETIRACETAM (KEPPRA) 500 MG/5 ML ADULT IV PUSH
1000.0000 mg | INTRAVENOUS | Status: AC
  Administered 2024-03-14: 1000 mg via INTRAVENOUS
  Filled 2024-03-14: qty 10

## 2024-03-14 MED ORDER — MIDAZOLAM HCL 2 MG/2ML IJ SOLN
2.0000 mg | INTRAMUSCULAR | Status: AC
  Administered 2024-03-14: 2 mg via INTRAVENOUS
  Filled 2024-03-14: qty 2

## 2024-03-14 MED ORDER — LEVETIRACETAM (KEPPRA) 500 MG/5 ML ADULT IV PUSH
500.0000 mg | Freq: Two times a day (BID) | INTRAVENOUS | Status: DC
  Administered 2024-03-15 – 2024-03-17 (×5): 500 mg via INTRAVENOUS
  Filled 2024-03-14 (×7): qty 5

## 2024-03-14 MED ORDER — MIDAZOLAM HCL 2 MG/2ML IJ SOLN
INTRAMUSCULAR | Status: AC
  Administered 2024-03-14: 2 mg via INTRAVENOUS
  Filled 2024-03-14: qty 2

## 2024-03-14 MED ORDER — SODIUM CHLORIDE 0.9 % IV SOLN
3.0000 g | Freq: Four times a day (QID) | INTRAVENOUS | Status: DC
  Administered 2024-03-14 – 2024-03-15 (×3): 3 g via INTRAVENOUS
  Filled 2024-03-14 (×5): qty 8

## 2024-03-14 MED ORDER — FENTANYL CITRATE PF 50 MCG/ML IJ SOSY
12.5000 ug | PREFILLED_SYRINGE | Freq: Once | INTRAMUSCULAR | Status: AC
  Administered 2024-03-14: 12.5 ug via INTRAVENOUS
  Filled 2024-03-14: qty 1

## 2024-03-14 MED ORDER — MAGNESIUM SULFATE 4 GM/100ML IV SOLN
4.0000 g | Freq: Once | INTRAVENOUS | Status: AC
  Administered 2024-03-14: 4 g via INTRAVENOUS
  Filled 2024-03-14: qty 100

## 2024-03-14 MED ORDER — MIDAZOLAM HCL 2 MG/2ML IJ SOLN
2.0000 mg | Freq: Once | INTRAMUSCULAR | Status: AC | PRN
  Administered 2024-03-14: 2 mg via INTRAVENOUS
  Filled 2024-03-14: qty 2

## 2024-03-14 MED ORDER — HALOPERIDOL LACTATE 5 MG/ML IJ SOLN
INTRAMUSCULAR | Status: AC
  Filled 2024-03-14: qty 1

## 2024-03-14 MED ORDER — DEXMEDETOMIDINE HCL IN NACL 400 MCG/100ML IV SOLN
0.0000 ug/kg/h | INTRAVENOUS | Status: DC
  Administered 2024-03-14: 0.3 ug/kg/h via INTRAVENOUS
  Administered 2024-03-14: 0.4 ug/kg/h via INTRAVENOUS
  Filled 2024-03-14: qty 100
  Filled 2024-03-14: qty 200

## 2024-03-14 MED ORDER — FENTANYL CITRATE PF 50 MCG/ML IJ SOSY
PREFILLED_SYRINGE | INTRAMUSCULAR | Status: AC
  Administered 2024-03-14: 12.5 ug via INTRAVENOUS
  Filled 2024-03-14: qty 1

## 2024-03-14 MED ORDER — MIDAZOLAM HCL 2 MG/2ML IJ SOLN
2.0000 mg | Freq: Once | INTRAMUSCULAR | Status: AC
  Administered 2024-03-14: 2 mg via INTRAVENOUS

## 2024-03-14 MED ORDER — ONDANSETRON HCL 4 MG/2ML IJ SOLN
INTRAMUSCULAR | Status: AC
  Administered 2024-03-14: 4 mg via INTRAVENOUS
  Filled 2024-03-14: qty 2

## 2024-03-14 MED ORDER — MIDAZOLAM HCL 2 MG/2ML IJ SOLN
1.0000 mg | INTRAMUSCULAR | Status: AC
  Administered 2024-03-14: 1 mg via INTRAVENOUS

## 2024-03-14 NOTE — Progress Notes (Signed)
 Patient unable to follow commands during the NIH stroke scale assessment at 11:52. Patient obtunded requiring repeated stimulation , medication previously given (MAR). She is intermittently having bursts of agitation and becoming combative.

## 2024-03-14 NOTE — Progress Notes (Signed)
 ICU team aware this RN cannot get a blood pressure. Patient agitated, IV's have been partially pulled out. Will be placing new IV's.

## 2024-03-14 NOTE — Progress Notes (Signed)
 This RN went into patients room to ambulate her after breakfast. Patient was sitting on the edge of the bed and started to have what appeared to be a seizure. ICU MD notified and vascular page. Family is at bedside.

## 2024-03-14 NOTE — Progress Notes (Signed)
 Eeg done

## 2024-03-14 NOTE — Consult Note (Addendum)
 NEUROLOGY CONSULT NOTE   Date of service: March 14, 2024 Patient Name: Brooke Davis MRN:  [id number] DOB:  1941-05-03 Chief Complaint: Seizure-like activity Requesting Provider: Marea Selinda RAMAN, MD  History of Present Illness  Brooke Davis is a 83 y.o. 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 This morning she had a 2 minute episode of possible seizure activity. RN described the semiology as follows: entire body became tense; her teeth were clamped down; and her eyes were rolled back. Once her body relaxed she became postictal. She received 2 mg of IV Versed  and a 1000 mg loading dose of Keppra  was ordered. STAT CT head and EEG are pending. She was started back on apixaban  this morning after being off of this medication for 3 days prior to her procedure. She has no prior history of seizure-like episodes.   Home medications also include Lipitor , Plavix  and ASA. Family states that her benzodiazepine regimen consists only of a low nightly dose to help her sleep. She does not drink alcohol.     ROS  Unable to obtain due to AMS.  Past History   Past Medical History:  Diagnosis Date   Anxiety    a.) on BZO (alprazolam ) PRN   Arthritis    fingers, right hip   Atrial fibrillation (HCC) 11/10/2013   a.) CHA2DS2VASc = 5 (age x2, sex, HTN, T2DM);  b.) rate/rhythm maintained on oral metoprolol  tartrate; chronically anticoagulated with apixaban    Bronchitis    Carotid artery stenosis    R    Cerebrovascular small vessel disease (chronic)    CHF (congestive heart failure) (HCC)    COPD (chronic obstructive pulmonary disease) (HCC)    Coronary artery disease involving native coronary artery of native heart 01/11/2021   a.) LHC 01/11/2021: 40% oLM, 50% mLCx, 30% oLCx, 30% pLAD, 30% mLAD, 99% pRCA (2.75 x 30 mm Resolute Onyx DES), 75% p-mRCA, 60% dRCA   DDD (degenerative disc disease), lumbar 12/01/2013   Diastolic dysfunction 01/12/2021   a.) TTE 01/12/2021 (s/p inf STEMI): EF 55-60%, mild-mod LAE, mild RAE, mod RVE, mild AR/MR, mod TR, G1DD   Diet-controlled type 2 diabetes mellitus (HCC)    Full dentures    Gout    History of bilateral cataract extraction 2021   Hyperlipidemia    Hypertension, essential    Long term current use of anticoagulant    a.) apixaban    PONV (postoperative nausea and vomiting)    Renal disorder    CKD, unknown stage   Sinoatrial node dysfunction (HCC) 11/10/2013   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) 01/11/2021   a.) LHC/PCI 01/11/2021 --> culprit lesion 99% pRCA --> 2.75 x 30 mm Resolute Onyx DES x 1   Stroke [facility])    July 2025, no deficits   Varicose veins of legs    Wears hearing aid in both ears    Past Surgical History:  Procedure Laterality Date   BREAST BIOPSY Left 2011   NEG   CARDIAC CATHETERIZATION     over 20 yrs ago.  All OK.   CAROTID PTA/STENT INTERVENTION Left 02/25/2023   Procedure: CAROTID PTA/STENT INTERVENTION;  Surgeon: Marea Selinda RAMAN, MD;  Location: [facility] INVASIVE CV LAB;  Service: Cardiovascular;  Laterality: Left;   CAROTID PTA/STENT INTERVENTION Right 03/13/2024   Procedure: CAROTID PTA/STENT INTERVENTION;  Surgeon: Marea Selinda RAMAN, MD;  Location: [facility] INVASIVE CV LAB;  Service: Cardiovascular;  Laterality: Right;   CATARACT EXTRACTION W/PHACO Left 03/27/2020   Procedure: CATARACT EXTRACTION PHACO AND INTRAOCULAR LENS PLACEMENT (IOC) LEFT 7.49  00:56.4  13.3%;  Surgeon: Mittie Gaskin, MD;   Location: [facility];  Service: Ophthalmology;  Laterality: Left;   CATARACT EXTRACTION W/PHACO Right 04/24/2020   Procedure: CATARACT EXTRACTION PHACO AND INTRAOCULAR LENS PLACEMENT (IOC) RIGHT;  Surgeon: Mittie Gaskin, MD;  Location: [facility];  Service: Ophthalmology;  Laterality: Right;  8.34 0:50.2 16.6%   CORONARY/GRAFT ACUTE MI REVASCULARIZATION N/A 01/11/2021   Procedure: Coronary/Graft Acute MI Revascularization;  Surgeon: Ammon Blunt, MD;  Location: [facility] INVASIVE CV [facility];  Service: Cardiovascular;  Laterality: N/A;   INCISION AND DRAINAGE Right 03/21/2023   Procedure: RIGHT HIP IRRIGATION AND DEBRIDEMENT OF INFECTED TOTAL JOINT, EXCHANGE OF HEAD AND POLY COMPONENTS WITH PLACEMENT OF ANTIBIOTIC BEADS;  Surgeon: Zafonte, Brian Thomas, MD;  Location: [facility] ORS;  Service: Orthopedics;  Laterality: Right;   LEFT HEART CATH AND CORONARY ANGIOGRAPHY N/A 01/11/2021   Procedure: LEFT HEART CATH AND CORONARY ANGIOGRAPHY;  Surgeon: Ammon Blunt, MD;  Location: [facility] IN[facility] CV [facility];  Service: Cardiovascular;  Laterality: N/A;   PARTIAL HYSTERECTOMY     TOE SURGERY Left    great toe; rod in place   TOTAL HIP ARTHROPLASTY Right 01/18/2023   Procedure: TOTAL HIP ARTHROPLASTY;  Surgeon: Mardee Lynwood SQUIBB, MD;  Location: [facility] ORS;  Service: Orthopedics;  Laterality: Right;   TOTAL KNEE ARTHROPLASTY Right 2007    Family History: Family History  Problem Relation Age of Onset   Heart attack Mother 12       required open heart surgery. Onset age is unknown.   Stroke Father    Diabetes Father    Alcoholism Brother    Breast cancer Neg Hx     Social History  reports that she has quit smoking. She has never used smokeless tobacco. She reports that she does not currently use alcohol. She reports that she does not use drugs.  Allergies  Allergen Reactions   Celebrex [Celecoxib] Diarrhea   Penicillins Itching    IgE = 154 (WNL) on 01/05/2023   Relafen  [Nabumetone]     Palpitation    Sulfa Antibiotics    Chlorhexidine  Rash   Lovastatin Diarrhea   Tape Rash    Medications   Current Facility-Administered Medications:    0.9 %  sodium chloride  infusion, 500 mL, Intravenous, Once PRN, Marea, Jason S, MD   acetaminophen  (TYLENOL ) tablet 325-650 mg, 325-650 mg, Oral, Q4H PRN, 650 mg at 03/13/24 1556 **OR** acetaminophen  (TYLENOL ) suppository 325-650 mg, 325-650 mg, Rectal, Q4H PRN, Marea Selinda RAMAN, MD   allopurinol  (ZYLOPRIM ) tablet 300 mg, 300 mg, Oral, Daily, Dew, Jason S, MD, 300 mg at 03/14/24 9065   ALPRAZolam  (XANAX ) tablet 0.25 mg, 0.25 mg, Oral, BID PRN, Dew, Jason S, MD, 0.25 mg at 03/14/24 0409   apixaban  (ELIQUIS ) tablet 5 mg, 5 mg, Oral, BID, Dew, Jason S, MD, 5 mg at 03/14/24 9065   aspirin  chewable tablet 81 mg, 81 mg, Oral, Daily, Dew, Jason S, MD, 81 mg at 03/14/24 0935   atorvastatin  (LIPITOR ) tablet 80  mg, 80 mg, Oral, Daily, Dew, Jason S, MD, 80 mg at 03/14/24 0935   citalopram  (CELEXA ) tablet 20 mg, 20 mg, Oral, Daily, Dew, Jason S, MD, 20 mg at 03/14/24 9066   clopidogrel  (PLAVIX ) tablet 75 mg, 75 mg, Oral, Daily, Dew, Jason S, MD, 75 mg at 03/14/24 9065   hydrALAZINE  (APRESOLINE ) injection 5 mg, 5 mg, Intravenous, Q20 Min PRN, Dew, Selinda RAMAN, MD   labetalol  (NORMODYNE ) injection 10 mg, 10 mg, Intravenous, Q10 min PRN, Marea, Selinda RAMAN, MD   levETIRAcetam  (KEPPRA ) undiluted injection 1,000 mg, 1,000 mg, Intravenous, STAT, Nelson, Dana G, NP   losartan  (COZAAR ) tablet 50 mg, 50 mg, Oral, Daily, Dew, Jason S, MD   melatonin tablet 5 mg, 5 mg, Oral, QHS, Dew, Jason S, MD, 5 mg at 03/13/24 2213   metoprolol  tartrate (LOPRESSOR ) injection 2.5-5 mg, 2.5-5 mg, Intravenous, Q2H PRN, Dew, Jason S, MD   metoprolol  tartrate (LOPRESSOR ) tablet 25 mg, 25 mg, Oral, BID, Dew, Jason S, MD, 25 mg at 03/13/24 2213   midazolam  (VERSED ) injection 2 mg, 2 mg, Intravenous, STAT, Nelson, Dana G, NP   morphine  (PF) 2 MG/ML injection 2-5 mg, 2-5 mg,  Intravenous, Q1H PRN, Marea, Selinda RAMAN, MD   ondansetron  (ZOFRAN ) injection 4 mg, 4 mg, Intravenous, Q6H PRN, Dew, Jason S, MD   Oral care mouth rinse, 15 mL, Mouth Rinse, PRN, Dew, Jason S, MD   phenol (CHLORASEPTIC) mouth spray 1 spray, 1 spray, Mouth/Throat, PRN, Marea, Selinda RAMAN, MD   potassium chloride  (KLOR-CON  M) CR tablet 10 mEq, 10 mEq, Oral, Daily, Dew, Selinda RAMAN, MD   potassium chloride  SA (KLOR-CON  M) CR tablet 40-60 mEq, 40-60 mEq, Oral, Daily PRN, Marea, Selinda RAMAN, MD   torsemide  (DEMADEX ) tablet 20 mg, 20 mg, Oral, Daily, Dew, Selinda RAMAN, MD, 20 mg at 03/14/24 0935   traMADol  (ULTRAM ) tablet 50 mg, 50 mg, Oral, Q4H PRN, Marea Selinda RAMAN, MD, 50 mg at 03/13/24 1718  Vitals   Vitals:   03/14/24 0600 03/14/24 0700 03/14/24 0752 03/14/24 0800  BP: (!) 118/52 127/60 (!) 143/55 (!) 143/55  Pulse: (!) 58 65 (!) 52 (!) 58  Resp: 19 20 17 19   Temp:  98 F (36.7 C) 98.1 F (36.7 C)   TempSrc:      SpO2: 94% 93% 94% 93%  Weight:      Height:        Body mass index is 34.03 kg/m.   Physical Exam   Constitutional: Appears well-developed and well-nourished. She is agitated with intermittent writhing on the bed.  Eyes: No scleral injection.  HENT: No OP obstruction.  Head: Normocephalic. No neck stiffness.  Respiratory: Effort normal, non-labored breathing.    Neurologic Examination  Mental Status: Awake, with agitated delirium and severely impaired attention. Will vocalize a few short exclamations that are dysarthric and mostly unintelligible, but was able to give her first and last names after being asked several questions without any coherent response. Does not follow commands to track, move limbs against resistance or count fingers, but did briefly glance at examiner's face as well as his hand when she was asked to count fingers. Could not identify her daughter.  Cranial Nerves: II: Does not blink to threat OU. PERRL. III,IV, VI: No ptosis. EOMI as she glances in various directions  conjugately while agitated. No nystagmus. V: Reacts to eyelid stimulation bilaterally VII: Grimace is symmetric VIII: Hearing intact to voice IX,X: No hypophonia or hoarseness XI: Symmetric XII: Does not protrude  tongue to command Motor: Full and equal strength in all 4 extremities as she thrashes and writhes intermittently in bed while agitated.  Sensory: Recardo ghazi after a delay following application of noxious pinch stimulus to each extremity. Withdraws the stimulated limbs and did attempt to swat examiner once. Deep Tendon Reflexes: Unable to accurately assess due to frequent writhing movements.  Plantars: Right: downgoingLeft: downgoing Cerebellar/Gait: Unable to assess   Labs/Imaging/Neurodiagnostic studies   CBC:  Recent Labs  Lab 03-23-24 1731 03/14/24 0511  WBC 6.2 5.5  NEUTROABS 4.0  --   HGB 11.7* 10.1*  HCT 35.0* 30.2*  MCV 105.1* 103.8*  PLT 221 190   Basic Metabolic Panel:  Lab Results  Component Value Date   NA 139 03/14/2024   K 3.4 (L) 03/14/2024   CO2 27 03/14/2024   GLUCOSE 114 (H) 03/14/2024   BUN 26 (H) 03/14/2024   CREATININE 1.30 (H) 03/14/2024   CALCIUM  9.1 03/14/2024   GFRNONAA 41 (L) 03/14/2024   Lipid Panel:  Lab Results  Component Value Date   LDLCALC 61 01/25/2024   HgbA1c:  Lab Results  Component Value Date   HGBA1C 5.5 01/24/2024   Urine Drug Screen: No results found for: LABOPIA, COCAINSCRNUR, LABBENZ, AMPHETMU, THCU, LABBARB  Alcohol Level     Component Value Date/Time   [facility] <15 01/24/2024 2107   INR  Lab Results  Component Value Date   INR 1.1 01/24/2024   APTT  Lab Results  Component Value Date   APTT 34 01/24/2024   Recent CTA of head and neck (at time of code stroke in July):  1. Limited study without evidence of proximal large vessel occlusion. 2. 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 3. Interval stenting of the left carotid bifurcation/proximal ICA with patent  stent. 4. Similar bilateral P2 PCA stenosis. 5. 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 6. Similar moderate left vertebral artery origin stenosis.  ASSESSMENT  83 y.o. 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 This morning she had a 2 minute episode of possible seizure activity. RN described the semiology as follows: entire body became tense; her teeth were clamped down; and her eyes were rolled back. Once her body relaxed she became postictal. She received 2 mg of IV Versed  and a 1000 mg loading dose of Keppra  was ordered. STAT CT head and EEG are pending. She was started back on apixaban  this morning after being off of this medication for 3 days prior to her procedure. She has no prior history of seizure-like episodes.  Home medications also include Lipitor , Plavix  and ASA. Family states that her benzodiazepine regimen consists only of a low nightly dose to help her sleep. She does not drink alcohol.   - Exam findings are most consistent with an agitated delirium - CT head: No acute intracranial abnormality. Extensive chronic small vessel ischemic disease. - EEG: Continuous slow, generalized. This technically difficult study is suggestive of moderate to severe diffuse encephalopathy. No seizures or epileptiform discharges were seen throughout the recording. - Impression:  -  Acute delirium, most likely multifactorial. Benzodiazepine withdrawal may be playing a role despite her relatively  low night-time dosing of Xanax .  - Possible seizure. Tramadol  may be lowering her seizure threshold. Possible component of Xanax  withdrawal.  - Acute stroke is felt to be unlikely based on the semiology of the event described above, as well as the exam findings.   RECOMMENDATIONS  - Discontinue Tramadol  - Continue her home Xanax  - Inpatient seizure precautions.  - Frequent neuro checks - MRI brain - Has been loaded with Keppra . Continue Keppra  at 500 mg BID (ordered)   ______________________________________________________________________    Brooke Davis, Brooke Polasek, MD [facility]

## 2024-03-14 NOTE — Progress Notes (Signed)
 Patient resting in bed. Precedex  infusing and family at bedside.

## 2024-03-14 NOTE — Progress Notes (Signed)
 Unable to place IV, Dr ROEL will being using US  to place IV. Continue to assess.

## 2024-03-14 NOTE — Plan of Care (Signed)
  Problem: Clinical Measurements: Goal: Will remain free from infection Outcome: Progressing Goal: Cardiovascular complication will be avoided Outcome: Progressing   Problem: Pain Managment: Goal: General experience of comfort will improve and/or be controlled Outcome: Progressing   Problem: Safety: Goal: Ability to remain free from injury will improve Outcome: Progressing   Problem: Clinical Measurements: Goal: Postoperative complications will be avoided or minimized Outcome: Progressing   Problem: Respiratory: Goal: Will achieve and/or maintain a regular respiratory rate, without signs or symptoms of dyspnea Outcome: Progressing   Problem: Skin Integrity: Goal: Demonstration of wound healing without infection will improve Outcome: Progressing

## 2024-03-14 NOTE — Progress Notes (Signed)
 This RN went down to get a CT of the head per orders. Patient confused, combative and trying to climb out of bed once we were in the CT room and placed on CT table. MD called and charge nurse brought PRN medication. Once back in ICU patient continued to be incoherent, confused and trying to climb out of bed. Medications given per order, ICU NP, ICU MD, vascular NP and neurologist in room (examined). Keppra  given, and EEG ordered. Daughter at bedside and updated. Continue to assess.

## 2024-03-14 NOTE — Procedures (Signed)
 Patient Name: Brooke Davis  MRN: [id number]  Epilepsy Attending: Arlin MALVA Krebs  Referring Physician/Provider: Maranda Lonell MATSU, NP  Date: 03/14/2024 Duration: 28.48 mins  Patient history: 83 year old female with seizure-like activity.  EEG to evaluate for seizure.  Level of alertness: Awake/ lethargic  AEDs during EEG study: versed   Technical aspects: This EEG study was done with scalp electrodes positioned according to the 10-20 International system of electrode placement. Electrical activity was reviewed with band pass filter of 1-70Hz , sensitivity of 7 uV/mm, display speed of 3mm/sec with a 60Hz  notched filter applied as appropriate. EEG data were recorded continuously and digitally stored.  Video monitoring was available and reviewed as appropriate.  Description: EEG showed continuous generalized 2-3 Hz delta slowing admixed with 13 to 15 Hz beta activity. Hyperventilation and photic stimulation were not performed.     Of note, study is technically difficult due to significant myogenic artifact  ABNORMALITY - Continuous slow, generalized  IMPRESSION: This technically difficult study is suggestive of moderate to severe diffuse encephalopathy. No seizures or epileptiform discharges were seen throughout the recording.  Afrika Brick O Lucelia Lacey

## 2024-03-14 NOTE — TOC Initial Note (Signed)
 Transition of Care [facility]) - Initial/Assessment Note    Patient Details  Name: Brooke Davis MRN: [id number] Date of Birth: 24-Jun-1941  Transition of Care [facility]) CM/SW Contact:    Corrie JINNY Ruts, LCSW Phone Number: 03/14/2024, 3:24 PM  Clinical Narrative:                 Chart reviewed. The patient was admitted for Carotid Stenosis. I spoke with the patient daughters at bedside due to the patient being sedated. The daughters, Mliss Cheese and Maeola Dross. The patient daughter confirmed that the patient has a PCP. The patient daughter reports that the patient lives with her nephew, Ozell Friend and his 3 children. The patient daughters reports that the assisted the patient with daily living task. The patient daughter reports that they take turns everyday caring for the patient in the home. The patient daughter confirms that they assist with transportation to medical appointments and they will be assisting to discharge.   The patient daughter reports that the patient uses [facility] for her [facility] and copays are affordable. The patient daughter reports that they patient has [facility] and the last time they were in the home last week. The patient daughters report that the patient has been in [facility] in the past as well. The patient daughter reports that they patient has a Rolator, BSC, Shower tools, Ramp, Adjustable bed, and wheel chair.   The patient daughters did not have any questions or concerns at the end of the consult.   TOC will follow the patient until discharge.      Barriers to Discharge: Continued Medical Work up   Patient Goals and CMS Choice            Expected Discharge Plan and Services       Living arrangements for the past 2 months: Single Family Home                                      Prior Living Arrangements/Services Living arrangements for the past 2 months: Single Family Home Lives with:: Adult Children, Other  (Comment) (The patient lives with her nephew and his 3 children.) Patient language and need for interpreter reviewed:: Yes        Need for Family Participation in Patient Care: Yes (Comment)     Criminal Activity/Legal Involvement Pertinent to Current Situation/Hospitalization: No - Comment as needed  Activities of Daily Living   ADL Screening (condition at time of admission) Independently performs ADLs?: Yes (appropriate for developmental age) Is the patient deaf or have difficulty hearing?: Yes (wears hearing aids) Does the patient have difficulty seeing, even when wearing glasses/contacts?: No Does the patient have difficulty concentrating, remembering, or making decisions?: No  Permission Sought/Granted   Permission granted to share information with : Yes, Release of Information Signed        Permission granted to share info w Relationship: Daughers  Permission granted to share info w Contact Information: Mliss Cheese [phone] and Maeola Dross [phone]  Emotional Assessment Appearance:: Appears stated age Attitude/Demeanor/Rapport: Unable to Assess Affect (typically observed): Unable to Assess Orientation: :  (unable to asses the patient was sedated.) Alcohol / Substance Use: Not Applicable Psych Involvement: No (comment)  Admission diagnosis:  Carotid stenosis, right [I65.21] Patient Active Problem List   Diagnosis Date Noted   Carotid stenosis, right 03/13/2024   Acute CVA (cerebrovascular accident) (  HCC) 01/24/2024   ABLA (acute blood loss anemia) 03/27/2023   Iron deficiency anemia 03/27/2023   Serratia infection 03/25/2023   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 02/2023 03/19/2023   CAD S/P percutaneous coronary angioplasty 03/19/2023   Generalized weakness 03/19/2023   Chronic anticoagulation 03/19/2023   Cellulitis of right hip 03/19/2023   Sepsis (HCC) 03/18/2023   Carotid  stenosis, symptomatic, with infarction (HCC) 02/25/2023   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   Atrial fibrillation, chronic (HCC) 01/28/2023   Dyslipidemia 01/28/2023   Chronic diastolic CHF (congestive heart failure) (HCC) 01/28/2023   Gout 01/28/2023   Anxiety and depression 01/28/2023   Right arm weakness 01/28/2023   Acute CVA (cerebrovascular accident) (HCC) 01/28/2023   Bilateral carotid artery stenosis 01/28/2023   Hx of total hip arthroplasty, 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 01/18/2023   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   Slurred speech 09/23/2021   STEMI involving oth coronary artery of inferior wall (HCC) 01/11/2021   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) 01/11/2021   Controlled type 2 diabetes mellitus with hyperglycemia, without long-term current use of insulin  (HCC) 01/11/2021   Pure hypercholesterolemia 01/11/2021   Hypercalcemia 01/11/2021   DDD (degenerative disc disease), lumbar 12/01/2013   Lumbar radiculitis 12/01/2013   Atrial fibrillation, transient (HCC) 11/10/2013   Sinoatrial node dysfunction (HCC) 11/10/2013   Primary hypertension 11/10/2013   Poorly-controlled hypertension 11/10/2013   PCP:  Valora Agent, MD Pharmacy:   [id number] [place], [place] - [address] AT [facility] OF [address] [place] [address] Phone: [phone] Fax: [phone]  [facility] [id number] [place], [place] - [address] [place] [address] Phone: [phone] Fax: [phone]     Social Drivers of Health (SDOH) Social History: SDOH Screenings   Food Insecurity: Patient Unable To Answer (01/25/2024)  Housing: Unknown (01/25/2024)  Transportation Needs: Patient Unable To Answer (01/25/2024)  Utilities: Patient Unable To Answer (01/25/2024)  Depression (PHQ2-9): Low Risk  (06/22/2023)   Financial Resource Strain: Patient Declined (11/05/2022)   Received from [facility]  Social Connections: Patient Unable To Answer (01/25/2024)  Tobacco Use: Medium Risk (03/13/2024)   SDOH Interventions:     Readmission Risk Interventions    03/14/2024    3:19 PM 03/01/2023   10:20 AM  Readmission Risk Prevention Plan  Transportation Screening Complete Complete  PCP or Specialist Appt within 3-5 Days Complete Complete  HRI or Home Care Consult  Complete  Social Work Consult for Recovery Care Planning/Counseling  Complete  Palliative Care Screening Not Applicable Not Applicable  Medication Review Oceanographer) Complete Complete

## 2024-03-14 NOTE — Consult Note (Signed)
 NAME:  Brooke Davis, MRN:  [id number], DOB:  [birth year], LOS: 1 ADMISSION DATE:  03/13/2024, CONSULTATION DATE: 03/14/2024 REFERRING MD: Dr. Marea, CHIEF COMPLAINT: Seizure-like activity    History of Present Illness:  This is an 83 yo female with a PMH of atrial fibrillation on apixaban , right cartid artery stenosis, CHF, COPD, CAD, type II diabetes mellitus, HTN, HLD, and CKD.  Pt has a hx of symptomatic high grade right artery stenosis and presented to [facility] ER on 08/25 to undergo right carotid endarterectomy with stenting.  Post procedure pt stable and admitted to ICU per vascular surgery.  Pt previously presented to [facility] ER on 03/11/24 with hypertension sbp in the 200's despite following her PCP doubling the dose of her outpatient antihypertensives.  During that presentation while waiting to be seen pts sbp remained in the 140's, pt asymptomatic, and lab results were reassuring per EDP notes.  Pt did not requiring hospital admission and was given strict return precautions.    Hospital Course On 08/26 pt was started back on her apixaban  the morning of 08/26 after being off this medication for 3 days prior to procedure. pt initially alert and oriented sitting on the side of the bed, but suddenly her entire body became tense, her teeth were clamped down, her eyes were rolled back, and she was unable to speak or follow commands.  This episode lasted for 2 minutes and following the episode pt body relaxed and she became postictal.  PCCM team consulted.  Pt received 2 mg of iv versed  and 1000 mg loading dose of keppra .  Stat CT Head, EEG, and neurology consulted by PCCM team.  Pt has no known hx of seizure activity.  She recently requiring hospitalization at [facility] on 07/07 to 07/10 with stroke-like symptoms (aphasia and left facial droop) she received TNK.  MRI Brain negative for acute intracranial abnormality.  During that admission pt instructed to restart aspirin  and eliquis , but stop plavix .     Pertinent  Medical History  Paroxysmal atrial fibrillation on apixaban  Right Carotid Artery Stenosis CHF COPD CAD Type II diabetes mellitus  HTN HLD CKD  Significant Hospital Events: Including procedures, antibiotic start and stop dates in addition to other pertinent events   08/25: Admitted to ICU stable post carotid endarterectomy with stenting 08/26: Pt developed seizure-like symptoms and became postictal.  PCCM team           consulted to assist with management,  Stat neurology consulted ordered.  CT Head       revealed no acute intracranial abnormality, but showed extensive chronic small        vessel ischemic disease. Stat EEG pending   Interim History / Subjective:  Following seizure-like activity pt initially postictal but later became agitated with incomprehensible speech and flailing arms.  Unable to redirect requiring multiple doses of versed  and 25 mg total of fentanyl .  Agitation improved following medication administration.    Objective    Blood pressure (!) 142/61, pulse 70, temperature 98.1 F (36.7 C), resp. rate 16, height 5' 2 (1.575 m), weight 84.4 kg, SpO2 99%.        Intake/Output Summary (Last 24 hours) at 03/14/2024 1207 Last data filed at 03/14/2024 0900 Gross per 24 hour  Intake 440.67 ml  Output 1000 ml  Net -559.33 ml   Filed Weights   03/13/24 0957 03/13/24 1303  Weight: 84.6 kg 84.4 kg    Examination: General: Acutely-ill appearing female, intermittently agitated on 2L O2 via nasal  canula  HENT: Supple, no JVD  Lungs: Clear throughout, even, non labored  Cardiovascular: NSR, s1s2, no m/r/g, 2+ radial/1+ distal pulses, no edema  Abdomen: +BS x4, obese, soft, non tender, non distended  Extremities: Moves all extremities  Neuro: Moves all extremities, unable to follow commands, intermittent agitation, unable to redirect, bilateral pupils dilated but reactive/sluggish  GU: Purewick present   Resolved problem list   Assessment and Plan    #Acute encephalopathy  #Seizure-like activity #Possible acute CVA Hx: Anxiety  CT Head 08/26:  No acute intracranial abnormality. Extensive chronic small vessel ischemic disease. - Correct metabolic derangements  - Will start precedex  gtt for delirium/agitation  - EEG results pending  - Neurology consulted appreciate input  - If mentation does not improve in the next 48-72hrs will require MRI Brain  - Pts family states pt does not drink alcohol  - She does take 0.25 mg of po xanax  at night, however she received a dose of 0.25 mg of xanax  at 04:00 am, low suspicion for benzo withdrawal  - Seizure precautions   #Symptomatic high grade right artery stenosis s/p right carotid endarterectomy with stenting #HTN  #CAD  Hx: Paroxysmal atrial fibrillation, HLD, inferior wall STEMI, and CVA  - Continuous telemetry monitoring  - Will hold anticoagulation for now pt unable to tolerate po's and she received a dose of apixaban  this am  - Troponin pending  - Allow for permissive hypertension for now given concern for acute ischemic CVA   #COPD without exacerbation  - Supplemental O2 for dyspnea and/or hypoxia  - Maintain O2 sats 88% to 92% - Scheduled and prn bronchodilator therapy  - ABG within normal limits   #Acute kidney injury  #Hypokalemia  - Trend BMP  - Replace electrolytes as indicated  - Strict I&O's  - Avoid nephrotoxic agents as able   #Anemia without obvious signs of bleeding  - Trend CBC  - Monitor for s/sx of bleeding  - Transfuse for hgb <7 - Folate and vitamin B12 levels pending   #Type II diabetes mellitus  - CBG's q4hrs  - Follow hypo/hyperglycemic protocol  - Target CBG readings 140 to 180  Best Practice (right click and Reselect all SmartList Selections daily)   Diet/type: NPO DVT prophylaxis Apixaban   Pressure ulcer(s): N/A GI prophylaxis: N/A Lines: N/A Foley:  N/A Code Status:  full code Last date of multidisciplinary goals of care discussion  [N/A]  Updated pts 2 daughters extensively at bedside regarding pts condition and current plan of care.  Discussed code status, and they both stated pt would want short term mechanical intubation if necessary but WOULD NOT want a tracheostomy or PEG tube.  Pts daughters are still trying to make a decision regarding CPR.  For now pt to remain a FULL CODE  Labs   CBC: Recent Labs  Lab 03/11/24 1731 03/14/24 0511  WBC 6.2 5.5  NEUTROABS 4.0  --   HGB 11.7* 10.1*  HCT 35.0* 30.2*  MCV 105.1* 103.8*  PLT 221 190    Basic Metabolic Panel: Recent Labs  Lab 03/11/24 1731 03/13/24 1003 03/14/24 0511  NA 137  --  139  K 3.9  --  3.4*  CL 97*  --  99  CO2 28  --  27  GLUCOSE 140*  --  114*  BUN 32* 29* 26*  CREATININE 1.12* 1.10* 1.30*  CALCIUM  9.6  --  9.1   GFR: Estimated Creatinine Clearance: 34.2 mL/min (A) (by C-G formula based on  SCr of 1.3 mg/dL (H)). Recent Labs  Lab 03/11/24 1731 03/14/24 0511  WBC 6.2 5.5    Liver Function Tests: No results for input(s): AST, ALT, ALKPHOS, BILITOT, PROT, ALBUMIN in the last 168 hours. No results for input(s): LIPASE, AMYLASE in the last 168 hours. No results for input(s): AMMONIA in the last 168 hours.  ABG    Component Value Date/Time   TCO2 27 02/18/2021 1252     Coagulation Profile: No results for input(s): INR, PROTIME in the last 168 hours.  Cardiac Enzymes: No results for input(s): CKTOTAL, CKMB, CKMBINDEX, TROPONINI in the last 168 hours.  HbA1C: Hgb A1c MFr Bld  Date/Time Value Ref Range Status  01/24/2024 09:07 PM 5.5 4.8 - 5.6 % Final    Comment:    (NOTE) Diagnosis of Diabetes The following HbA1c ranges recommended by the American Diabetes Association (ADA) may be used as an aid in the diagnosis of diabetes mellitus.  Hemoglobin             Suggested A1C NGSP%              Diagnosis  <5.7                   Non Diabetic  5.7-6.4                Pre-Diabetic  >6.4                    Diabetic  <7.0                   Glycemic control for                       adults with diabetes.    01/05/2023 10:51 AM 6.1 (H) 4.8 - 5.6 % Final    Comment:    (NOTE) Pre diabetes:          5.7%-6.4%  Diabetes:              >6.4%  Glycemic control for   <7.0% adults with diabetes     CBG: Recent Labs  Lab 03/13/24 1306  GLUCAP 124*    Review of Systems:   Unable to assess due to acute encephalopathy   Past Medical History:  She,  has a past medical history of Anxiety, Arthritis, Atrial fibrillation (HCC) (11/10/2013), Bronchitis, Carotid artery stenosis, Cerebrovascular small vessel disease (chronic), CHF (congestive heart failure) (HCC), COPD (chronic obstructive pulmonary disease) (HCC), Coronary artery disease involving native coronary artery of native heart (01/11/2021), DDD (degenerative disc disease), lumbar (12/01/2013), Diastolic dysfunction (01/12/2021), Diet-controlled type 2 diabetes mellitus (HCC), Full dentures, Gout, History of bilateral cataract extraction (2021), Hyperlipidemia, Hypertension, essential, Long term current use of anticoagulant, PONV (postoperative nausea and vomiting), Renal disorder, Sinoatrial node dysfunction (HCC) (11/10/2013), 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) (01/11/2021), Stroke (HCC), Varicose veins of legs, and Wears hearing aid in both ears.   Surgical History:   Past Surgical History:  Procedure Laterality Date   BREAST BIOPSY Left 2011   NEG   CARDIAC CATHETERIZATION     over 20 yrs ago.  All OK.   CAROTID PTA/STENT INTERVENTION Left 02/25/2023   Procedure: CAROTID PTA/STENT INTERVENTION;  Surgeon: Marea Selinda RAMAN, MD;  Location: [facility] INVASIVE CV LAB;  Service: Cardiovascular;  Laterality: Left;   CAROTID PTA/STENT INTERVENTION Right 03/13/2024   Procedure: CAROTID PTA/STENT INTERVENTION;  Surgeon: Marea Selinda RAMAN, MD;  Location: [facility]  INVASIVE CV LAB;  Service: Cardiovascular;  Laterality: Right;    CATARACT EXTRACTION W/PHACO Left 03/27/2020   Procedure: CATARACT EXTRACTION PHACO AND INTRAOCULAR LENS PLACEMENT (IOC) LEFT 7.49  00:56.4  13.3%;  Surgeon: Mittie Gaskin, MD;  Location: [facility];  Service: Ophthalmology;  Laterality: Left;   CATARACT EXTRACTION W/PHACO Right 04/24/2020   Procedure: CATARACT EXTRACTION PHACO AND INTRAOCULAR LENS PLACEMENT (IOC) RIGHT;  Surgeon: Mittie Gaskin, MD;  Location: [facility];  Service: Ophthalmology;  Laterality: Right;  8.34 0:50.2 16.6%   CORONARY/GRAFT ACUTE MI REVASCULARIZATION N/A 01/11/2021   Procedure: Coronary/Graft Acute MI Revascularization;  Surgeon: Ammon Blunt, MD;  Location: [facility] INVASIVE CV LAB;  Service: Cardiovascular;  Laterality: N/A;   INCISION AND DRAINAGE Right 03/21/2023   Procedure: RIGHT HIP IRRIGATION AND DEBRIDEMENT OF INFECTED TOTAL JOINT, EXCHANGE OF HEAD AND POLY COMPONENTS WITH PLACEMENT OF ANTIBIOTIC BEADS;  Surgeon: Zafonte, Brian Thomas, MD;  Location: [facility] ORS;  Service: Orthopedics;  Laterality: Right;   LEFT HEART CATH AND CORONARY ANGIOGRAPHY N/A 01/11/2021   Procedure: LEFT HEART CATH AND CORONARY ANGIOGRAPHY;  Surgeon: Ammon Blunt, MD;  Location: [facility] INVASIVE CV LAB;  Service: Cardiovascular;  Laterality: N/A;   PARTIAL HYSTERECTOMY     TOE SURGERY Left    great toe; rod in place   TOTAL HIP ARTHROPLASTY Right 01/18/2023   Procedure: TOTAL HIP ARTHROPLASTY;  Surgeon: Mardee Lynwood SQUIBB, MD;  Location: [facility] ORS;  Service: Orthopedics;  Laterality: Right;   TOTAL KNEE ARTHROPLASTY Right 2007     Social History:   reports that she has quit smoking. She has never used smokeless tobacco. She reports that she does not currently use alcohol. She reports that she does not use drugs.   Family History:  Her family history includes Alcoholism in her brother; Diabetes in her father; Heart attack (age of onset: 20) in her mother; Stroke in her father. There is no history of  Breast cancer.   Allergies Allergies  Allergen Reactions   Celebrex [Celecoxib] Diarrhea   Penicillins Itching    IgE = 154 (WNL) on 01/05/2023   Relafen [Nabumetone]     Palpitation    Sulfa Antibiotics    Chlorhexidine  Rash   Lovastatin Diarrhea   Tape Rash     Home Medications  Prior to Admission medications   Medication Sig Start Date End Date Taking? Authorizing Provider  allopurinol  (ZYLOPRIM ) 300 MG tablet Take 1 tablet (300 mg total) by mouth daily. 02/10/23  Yes Angiulli, Toribio PARAS, PA-C  ALPRAZolam  (XANAX ) 0.25 MG tablet Take 1 tablet (0.25 mg total) by mouth 2 (two) times daily as needed for anxiety. 04/01/23  Yes Dorinda Drue DASEN, MD  aspirin  81 MG chewable tablet Chew 1 tablet (81 mg total) by mouth daily. 01/28/24 04/27/24 Yes Dezii, Alexandra, DO  citalopram  (CELEXA ) 20 MG tablet Take 1 tablet (20 mg total) by mouth daily. 02/10/23  Yes Angiulli, Toribio PARAS, PA-C  metoprolol  tartrate (LOPRESSOR ) 25 MG tablet Take 1 tablet (25 mg total) by mouth 2 (two) times daily. 02/10/23  Yes Angiulli, Toribio PARAS, PA-C  potassium chloride  (KLOR-CON ) 10 MEQ tablet Take 1 tablet (10 mEq total) by mouth daily. 02/10/23  Yes Angiulli, Toribio PARAS, PA-C  torsemide  (DEMADEX ) 20 MG tablet Take 1 tablet (20 mg total) by mouth daily. 02/10/23  Yes Angiulli, Toribio PARAS, PA-C  acetaminophen  (TYLENOL ) 325 MG tablet Take 2 tablets (650 mg total) by mouth every 6 (six) hours as needed for mild pain (or Fever >/= 101). 02/10/23  Angiulli, Toribio PARAS, PA-C  apixaban  (ELIQUIS ) 5 MG TABS tablet Take 1 tablet (5 mg total) by mouth 2 (two) times daily. 01/27/24 04/26/24  Dezii, Alexandra, DO  atorvastatin  (LIPITOR ) 80 MG tablet Take 1 tablet (80 mg total) by mouth daily. 03/01/23   Pace, Gwendlyn SAUNDERS, NP  ciprofloxacin  (CIPRO ) 500 MG tablet Take 1 tablet (500 mg total) by mouth 2 (two) times daily. 04/27/23   Fayette Bodily, MD  clopidogrel  (PLAVIX ) 75 MG tablet TAKE 1 TABLET(75 MG) BY MOUTH DAILY 01/14/24   Marea Selinda RAMAN, MD   losartan  (COZAAR ) 50 MG tablet Take 1 tablet (50 mg total) by mouth daily for 25 days. 01/28/24 02/22/24  Dezii, Alexandra, DO  melatonin 5 MG TABS Take 1 tablet (5 mg total) by mouth at bedtime. 02/10/23   Angiulli, Daniel J, PA-C  nystatin  (MYCOSTATIN /NYSTOP ) powder Apply 1 Application topically 3 (three) times daily. 05/07/23   Fayette Bodily, MD  traMADol  (ULTRAM ) 50 MG tablet Take 50 mg by mouth every 4 (four) hours as needed for moderate pain (pain score 4-6).    [provider]     Critical care time: 80 minutes      Lonell Moose, AGNP  Pulmonary/Critical Care Pager [phone] (please enter 7 digits) PCCM Consult Pager [phone] (please enter 7 digits)

## 2024-03-14 NOTE — Progress Notes (Signed)
 Called in to assess patient for nausea and vomitting and question of aspiration by Ms. Wysong's Nurse. On my arrival patient was saturating well and adequately protecting her airways. She was somewhat agitated and having dry heaves with coughing spells. Adequately protecting her airways at the time. She had received multiple doses of versed  and a dose of fentanyl . Labs this am reassuring. CT head without any acute intracranial abnormality.   I advised her nurse to give another dose of zofran  and advised her to page the vascular team who are Ms. Noa's primary team.   At this moment there are no critical care needs. Please do not hesitate to contact us  back with any concerns.  Darrin Barn, MD [facility] 03/14/2024 2:06 PM

## 2024-03-14 NOTE — Progress Notes (Signed)
 [facility]  Daily Progress Note   Subjective  -   Patient was resting quietly without any major issues and was awake and alert this morning.  However, abruptly at about 11:00 when she was being gotten out of bed to try to walk before discharge, she had acute confusion and collapse with what appeared to be seizure activity.  She became very combative.  She was taken to the CT scanner which I have reviewed and does not demonstrate any hemorrhage intracranially.  No acute intracranial processes identified, but understandably this would be too early to see any ischemic event on his CT scan.  She had originally done well after her carotid stent which was finished almost 24 hours prior to her event.  She had been having recurring TIA symptoms and a stroke within the past few months.  Neurology and critical care service have seen the patient and assisted with her management all afternoon and we appreciate this.  She is now on a Precedex  drip and is resting comfortably.  Objective Vitals:   03/14/24 1345 03/14/24 1545 03/14/24 1600 03/14/24 1615  BP: (!) 135/100 (!) 179/99 (!) 168/81 (!) 169/67  Pulse: (!) 131 (!) 52    Resp: 20     Temp:      TempSrc:      SpO2: 100% 95% 95%   Weight:      Height:        Intake/Output Summary (Last 24 hours) at 03/14/2024 1651 Last data filed at 03/14/2024 0900 Gross per 24 hour  Intake 440.67 ml  Output 1000 ml  Net -559.33 ml    PULM  CTAB CV  irregularly irregular VASC  access site is clean, dry, and intact.  Laboratory CBC    Component Value Date/Time   WBC 11.1 (H) 03/14/2024 1524   HGB 11.7 (L) 03/14/2024 1524   HCT 34.7 (L) 03/14/2024 1524   PLT 214 03/14/2024 1524    BMET    Component Value Date/Time   NA 139 03/14/2024 0511   K 3.4 (L) 03/14/2024 0511   CL 99 03/14/2024 0511   CO2 27 03/14/2024 0511   GLUCOSE 114 (H) 03/14/2024 0511   BUN 26 (H) 03/14/2024 0511   CREATININE 1.30 (H) 03/14/2024 0511    CALCIUM  9.1 03/14/2024 0511   GFRNONAA 41 (L) 03/14/2024 0511    Assessment/Planning: POD #1 s/p right carotid stent placement  Patient with an event this afternoon that is of unclear etiology.  Possible seizure activity.  Could be some sort of reperfusion syndrome.  Did not have a hemorrhage. Could also be an ischemic event after her stent although that is fairly uncommon to have acute thrombosis of the stent without profound hypotension. Appreciate input from both the neurology service and the critical care service. Critical care has now taken primary management for the patient which seems reasonable and we appreciate this. We will follow along. Would continue antiplatelet therapy.  She was previously on Eliquis  and if there is no bleeding Eliquis  and Plavix  would be preferred. Have discussed the case with her daughters and the critical care service has been updating them as well. Please contact with any questions.   Selinda Gu  03/14/2024, 4:51 PM

## 2024-03-14 NOTE — Progress Notes (Signed)
 Upon entering patients room, patient vomiting. Zofran  given earlier. JP called and per MD he is not consulted. Paged Brien NP.

## 2024-03-15 ENCOUNTER — Inpatient Hospital Stay (HOSPITAL_COMMUNITY)
Admission: RE | Admit: 2024-03-15 | Discharge: 2024-03-15 | Disposition: A | Discharge status: Home / Self Care | Source: Home / Self Care | Attending: Pulmonary Disease | Admitting: Vascular Surgery

## 2024-03-15 DIAGNOSIS — I6521 Occlusion and stenosis of right carotid artery: Secondary | ICD-10-CM | POA: Diagnosis not present

## 2024-03-15 DIAGNOSIS — G9340 Encephalopathy, unspecified: Secondary | ICD-10-CM | POA: Diagnosis not present

## 2024-03-15 DIAGNOSIS — R001 Bradycardia, unspecified: Secondary | ICD-10-CM | POA: Diagnosis not present

## 2024-03-15 DIAGNOSIS — N179 Acute kidney failure, unspecified: Secondary | ICD-10-CM | POA: Diagnosis not present

## 2024-03-15 DIAGNOSIS — I6389 Other cerebral infarction: Secondary | ICD-10-CM

## 2024-03-15 LAB — BASIC METABOLIC PANEL WITH GFR
Anion gap: 11 (ref 5–15)
BUN: 23 mg/dL (ref 8–23)
CO2: 29 mmol/L (ref 22–32)
Calcium: 9.4 mg/dL (ref 8.9–10.3)
Chloride: 100 mmol/L (ref 98–111)
Creatinine, Ser: 1.28 mg/dL — ABNORMAL HIGH (ref 0.44–1.00)
GFR, Estimated: 42 mL/min — ABNORMAL LOW (ref >60)
Glucose, Bld: 158 mg/dL — ABNORMAL HIGH (ref 70–99)
Potassium: 4 mmol/L (ref 3.5–5.1)
Sodium: 140 mmol/L (ref 135–145)

## 2024-03-15 LAB — MAGNESIUM: Magnesium: 3.1 mg/dL — ABNORMAL HIGH (ref 1.7–2.4)

## 2024-03-15 LAB — ECHOCARDIOGRAM COMPLETE
AR max vel: 1.19 cm2
AV Area VTI: 1.1 cm2
AV Area mean vel: 1.13 cm2
AV Mean grad: 16.5 mmHg
AV Peak grad: 21.9 mmHg
Ao pk vel: 2.34 m/s
Area-P 1/2: 2.88 cm2
Calc EF: 65.8 %
Height: 62 in
MV VTI: 1.8 cm2
S' Lateral: 2.15 cm
Single Plane A2C EF: 70.1 %
Single Plane A4C EF: 64.6 %
Weight: 2977.09 [oz_av]

## 2024-03-15 LAB — GLUCOSE, CAPILLARY
Glucose-Capillary: 109 mg/dL — ABNORMAL HIGH (ref 70–99)
Glucose-Capillary: 127 mg/dL — ABNORMAL HIGH (ref 70–99)
Glucose-Capillary: 130 mg/dL — ABNORMAL HIGH (ref 70–99)
Glucose-Capillary: 142 mg/dL — ABNORMAL HIGH (ref 70–99)
Glucose-Capillary: 143 mg/dL — ABNORMAL HIGH (ref 70–99)
Glucose-Capillary: 151 mg/dL — ABNORMAL HIGH (ref 70–99)
Glucose-Capillary: 173 mg/dL — ABNORMAL HIGH (ref 70–99)

## 2024-03-15 LAB — CBC
HCT: 33.4 % — ABNORMAL LOW (ref 36.0–46.0)
Hemoglobin: 10.9 g/dL — ABNORMAL LOW (ref 12.0–15.0)
MCH: 34 pg (ref 26.0–34.0)
MCHC: 32.6 g/dL (ref 30.0–36.0)
MCV: 104 fL — ABNORMAL HIGH (ref 80.0–100.0)
Platelets: 178 K/uL (ref 150–400)
RBC: 3.21 MIL/uL — ABNORMAL LOW (ref 3.87–5.11)
RDW: 13.8 % (ref 11.5–15.5)
WBC: 8 K/uL (ref 4.0–10.5)
nRBC: 0 % (ref 0.0–0.2)

## 2024-03-15 LAB — LACTIC ACID, PLASMA: Lactic Acid, Venous: 1.5 mmol/L (ref 0.5–1.9)

## 2024-03-15 LAB — TROPONIN I (HIGH SENSITIVITY): Troponin I (High Sensitivity): 25 ng/L — ABNORMAL HIGH (ref <18)

## 2024-03-15 LAB — PHOSPHORUS: Phosphorus: 4.1 mg/dL (ref 2.5–4.6)

## 2024-03-15 LAB — PROCALCITONIN: Procalcitonin: <0.1 ng/mL

## 2024-03-15 MED ORDER — METOPROLOL TARTRATE 25 MG PO TABS
12.5000 mg | ORAL_TABLET | Freq: Two times a day (BID) | ORAL | Status: DC
  Administered 2024-03-15 – 2024-03-17 (×4): 12.5 mg via ORAL
  Filled 2024-03-15 (×4): qty 1

## 2024-03-15 MED ORDER — APIXABAN 5 MG PO TABS
5.0000 mg | ORAL_TABLET | Freq: Once | ORAL | Status: AC
  Administered 2024-03-15: 5 mg via ORAL
  Filled 2024-03-15: qty 1

## 2024-03-15 MED ORDER — METOPROLOL TARTRATE 25 MG PO TABS: 12.5000 mg | ORAL_TABLET | Freq: Two times a day (BID) | ORAL | Status: DC

## 2024-03-15 MED ORDER — APIXABAN 5 MG PO TABS
5.0000 mg | ORAL_TABLET | Freq: Two times a day (BID) | ORAL | Status: DC
  Administered 2024-03-15 – 2024-03-17 (×5): 5 mg via ORAL
  Filled 2024-03-15 (×5): qty 1

## 2024-03-15 MED ORDER — SODIUM CHLORIDE 0.9 % IV SOLN
1.0000 g | INTRAVENOUS | Status: DC
  Administered 2024-03-15: 1 g via INTRAVENOUS
  Filled 2024-03-15: qty 10

## 2024-03-15 NOTE — Progress Notes (Signed)
 Per JP patients transitioning to floor care. Orders to change vitals to 2-4 hour checks. Patient up in chair with no pain or shortness of breath. Continue to assess.

## 2024-03-15 NOTE — Evaluation (Signed)
 Occupational Therapy Evaluation Patient Details Name: Brooke Davis MRN: [id number] DOB: 10/13/1940 Today's Date: 03/15/2024   History of Present Illness   Pt is an 83 year old female dmitted for elective right carotid stent placement on 03/13/24.   Course complicated by seizure like activity and Acute Metabolic Encephalopathy (likely postictal delirium) and multiple scattered acute small punctate right hemispheric ischemic strokes.         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 PTA pt is MOD I for ADL, has assist for IADL, amb with rollator. She [facility]'d from [facility] therapy following previous hospitalization. Pt presents with deficits in strength, endurance, activity tolerance, balance, LUE function, affecting safe and optimal ADL completion. Pt performs bed mobility with CGA, STS with supervision-CGA with RW, amb approx 40' with RW with CGA with close chair follow. SET UP for grooming tasks. Pt will have assist for ADL at home as needed. Pt is left in chair, all needs met. OT will follow acutely to facilitate optimal ADL/functional mobility performance.      If plan is discharge home, recommend the following:   A little help with walking and/or transfers;A little help with bathing/dressing/bathroom     Functional Status Assessment   Patient has had a recent decline in their functional status and demonstrates the ability to make significant improvements in function in a reasonable and predictable amount of time.      Equipment Recommendations   Other (comment) (pt has recommended equipment)     Recommendations for Other Services         Precautions/Restrictions   Precautions Precautions: Fall Recall of Precautions/Restrictions: Intact Restrictions Weight Bearing Restrictions Per Provider Order: No     Mobility Bed Mobility Overal bed mobility: Needs Assistance Bed Mobility: Supine to Sit, Sit to Supine                Transfers Overall transfer level: Needs assistance Equipment used: Rolling walker (2 wheels) Transfers: Sit to/from Stand Sit to Stand: Supervision, Contact guard assist                  Balance Overall balance assessment: Needs assistance Sitting-balance support: Feet supported Sitting balance-Leahy Scale: Good     Standing balance support: Bilateral upper extremity supported, During functional activity, Reliant on assistive device for balance Standing balance-Leahy Scale: Fair                             ADL either performed or assessed with clinical judgement   ADL Overall ADL's : Needs assistance/impaired     Grooming: Supervision/safety;Sitting               Lower Body Dressing: Moderate assistance Lower Body Dressing Details (indicate cue type and reason): anticipate Toilet Transfer: Contact guard assist;Rolling walker (2 wheels) (approx 40')           Functional mobility during ADLs: Contact guard assist;Rolling walker (2 wheels) (approx 40, +2 for chair follow)       Vision Patient Visual  Report: No change from baseline Vision Assessment?: Yes Ocular Range of Motion: Within Functional Limits Visual Fields: No apparent deficits   ?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, will continue to assess   Perception         Praxis         Pertinent Vitals/Pain Pain Assessment Pain Assessment: No/denies pain     Extremity/Trunk Assessment Upper Extremity Assessment Upper Extremity Assessment: Left hand dominant;LUE  deficits/detail LUE Coordination: decreased fine motor   Lower Extremity Assessment Lower Extremity Assessment: Defer to PT evaluation       Communication Communication Communication: No apparent difficulties   Cognition Arousal: Alert Behavior During Therapy: WFL for tasks assessed/performed Cognition: No apparent impairments                               Following commands: Intact       Cueing  General Comments   Cueing Techniques: Verbal cues  vss throughout   Exercises Other Exercises Other Exercises: edu re role of OT, role of rehab, discharge recommendations; family re: LUE FMC/dexterity HEP   Shoulder Instructions      Home Living Family/patient expects to be discharged to:: Private residence Living Arrangements: Other relatives (grandson and his three children) Available Help at Discharge: Family;Available 24 hours/day Type of Home: House Home Access: Level entry     Home Layout: One level     Bathroom Shower/Tub: Tub only   Bathroom Toilet: Handicapped height Bathroom Accessibility: No   Home Equipment: Rolling Walker (2 wheels);BSC/3in1;Shower seat;Grab bars - toilet;Grab bars - tub/shower;Toilet riser          Prior Functioning/Environment Prior Level of Function : Independent/Modified Independent             Mobility Comments: amb with rollator household/community distances ADLs Comments: MOD I-I with ADL, has assist with IADL    OT Problem List: Decreased strength;Decreased activity tolerance;Decreased knowledge of use of DME or AE;Decreased safety awareness;Impaired balance (sitting and/or standing)   OT Treatment/Interventions: Self-care/ADL training;DME and/or AE instruction;Therapeutic activities;Balance training;Therapeutic exercise;Patient/family education      OT Goals(Current goals can be found in the care plan section)   Acute Rehab OT Goals Patient Stated Goal: go home OT Goal Formulation: With  patient Time For Goal Achievement: 03/29/24 Potential to Achieve Goals: Good ADL Goals Pt Will Perform Grooming: with modified independence;sitting;standing Pt Will Perform Lower Body Dressing: with modified independence;sit to/from stand;sitting/lateral leans Pt Will Transfer to Toilet: with modified independence;ambulating Pt Will Perform Toileting - Clothing Manipulation and hygiene: with modified independence;sitting/lateral leans;sit to/from stand   OT Frequency:  Min 3X/week    Co-evaluation              AM-PAC OT 6 Clicks Daily Activity     Outcome Measure Help from another person eating meals?: None Help from another person taking care of personal grooming?: None Help from another person toileting, which includes using toliet, bedpan, or urinal?: A Little Help from another person bathing (including washing, rinsing, drying)?: A Little Help from another person to put on and taking off regular upper body clothing?: None Help from another person to put on and taking off regular lower body clothing?: A Little 6 Click Score: 21   End of Session Equipment Utilized During Treatment: Rolling walker (2 wheels) Nurse Communication: Mobility status  Activity Tolerance: Patient tolerated treatment well Patient left: in chair;with call bell/phone within reach;with chair alarm set (in care of  PT)  OT Visit Diagnosis: Other abnormalities of gait and mobility (R26.89);Muscle weakness (generalized) (M62.81);Other symptoms and signs involving cognitive function                Time: 8880-8848 OT Time Calculation (min): 32 min Charges:  OT General Charges $OT Visit: 1 Visit OT Evaluation $OT Eval High Complexity: 1 High Therisa Sheffield, OTD OTR/L  03/15/24, 12:43 PM

## 2024-03-15 NOTE — Evaluation (Signed)
 Physical Therapy Evaluation Patient Details Name: Brooke Davis MRN: [id number] DOB: December 20, 1940 Today's Date: 03/15/2024  History of Present Illness  Pt is an 83 year old female admitted for elective right carotid stent placement on 03/13/24.   Course complicated by seizure like activity and Acute Metabolic Encephalopathy (likely postictal delirium) and multiple scattered acute small punctate right hemispheric ischemic strokes.         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 Pt denied pain throughout session. At baseline per pt's daughters, pt is amb with rollator for household distances and requires some assistance for IADLs. Pt completed bed mobility with supervision and HOB elevated. Pt completed STS from EOB at elevated height with RW and CGA. Additional STS from recliner with minA to achieve full standing. Pt amb ~6ft with RW and CGA, no LOB or excessive path deviation throughout. Pt's SpO2 was monitored throughout session and remained within range of 93-96% with pt on RA, reported mild SOB after amb that improved with rest. HR also remained WNL throughout. Pt is currently displaying deficits in functional mobility and activity tolerance and would benefit from skilled PT intervention to address listed deficits and allow for safe return to PLOF.       If plan is discharge home, recommend the following: A little help with walking and/or transfers;A little help with bathing/dressing/bathroom;Assistance with cooking/housework;Assist for transportation   Can  travel by private vehicle        Equipment Recommendations None recommended by PT  Recommendations for Other Services       Functional Status Assessment Patient has had a recent decline in their functional status and demonstrates the ability to make significant improvements in function in a reasonable and predictable amount of time.     Precautions / Restrictions Precautions Precautions: Fall Recall of Precautions/Restrictions: Intact Restrictions Weight Bearing Restrictions Per Provider Order: No      Mobility  Bed Mobility Overal bed mobility: Needs Assistance Bed Mobility: Supine to Sit     Supine to sit: Supervision, HOB elevated, Used rails     General bed mobility comments: no physical assistance for supine > sit, HOB elevated, assist with lines/leads management    Transfers Overall transfer level: Needs assistance Equipment used: Rolling walker (2 wheels) Transfers: Sit to/from Stand Sit to Stand: Contact guard assist, Min assist           General transfer comment: 1 STS from EOB with RW and CGA, 1 STS from recliner with RW and minA to achieve standing    Ambulation/Gait Ambulation/Gait assistance: Contact guard assist Gait Distance (Feet): 40 Feet Assistive device: Rolling walker (2 wheels) Gait Pattern/deviations: Step-through pattern, Wide base of support       General Gait Details: decreased cadence, no LOB  Stairs            Wheelchair Mobility     Tilt Bed    Modified Rankin (Stroke Patients Only)       Balance Overall balance assessment: Needs assistance Sitting-balance support: Feet supported Sitting balance-Leahy Scale: Good     Standing balance support: Bilateral upper extremity supported, Reliant  on assistive device for balance Standing balance-Leahy Scale: Fair Standing balance comment: no LOB but reliant on BUE support                             Pertinent Vitals/Pain Pain Assessment Pain Assessment:  No/denies pain    Home Living Family/patient expects to be discharged to:: Private residence Living Arrangements: Other relatives (grandson and 3 great-grandchildren) Available Help at Discharge: Family;Available 24 hours/day Type of Home: House Home Access: Level entry       Home Layout: One level Home Equipment: Rolling Walker (2 wheels);BSC/3in1;Shower seat;Grab bars - toilet;Grab bars - tub/shower;Toilet riser      Prior Function Prior Level of Function : Independent/Modified Independent             Mobility Comments: pt is a household amb with rollator at baseline, no falls ADLs Comments: pt requires assist for some IADLs     Extremity/Trunk Assessment   Upper Extremity Assessment Upper Extremity Assessment: Defer to OT evaluation LUE Coordination: decreased fine motor    Lower Extremity Assessment Lower Extremity Assessment: RLE deficits/detail;LLE deficits/detail RLE Deficits / Details: hip flexion 4-/5, knee extension 5/5 LLE Deficits / Details: hip flexion and knee extension at least 3+/5, painful with applied resistance       Communication   Communication Communication: No apparent difficulties Factors Affecting Communication: Hearing impaired    Cognition Arousal: Alert Behavior During Therapy: WFL for tasks assessed/performed   PT - Cognitive impairments: No apparent impairments                       PT - Cognition Comments: A&Ox4 Following commands: Intact       Cueing Cueing Techniques: Verbal cues, Tactile cues     General Comments General comments (skin integrity, edema, etc.): dressing over R femoral artery access site clean and dry throughout    Exercises Other Exercises Other Exercises: SpO2 remained between 93-96% throughout session with pt on RA.   Assessment/Plan    PT Assessment Patient needs continued PT services  PT Problem List Decreased strength;Decreased activity tolerance;Decreased balance;Decreased  mobility       PT Treatment Interventions DME instruction;Gait training;Functional mobility training;Therapeutic activities;Therapeutic exercise;Balance training;Neuromuscular re-education;Patient/family education;Cognitive remediation    PT Goals (Current goals can be found in the Care Plan section)  Acute Rehab PT Goals Patient Stated Goal: to get better PT Goal Formulation: With patient Time For Goal Achievement: 03/29/24 Potential to Achieve Goals: Good    Frequency Min 3X/week     Co-evaluation PT/OT/SLP Co-Evaluation/Treatment: Yes Reason for Co-Treatment: For patient/therapist safety;To address functional/ADL transfers PT goals addressed during session: Mobility/safety with mobility         AM-PAC PT 6 Clicks Mobility  Outcome Measure Help needed turning from your back to your side while in a flat bed without using bedrails?: A Little Help needed moving from lying on your back to sitting on the side of a flat bed without using bedrails?: A Little Help needed moving to and from a bed to a chair (including a wheelchair)?: A Little Help needed standing up from a chair using your arms (e.g., wheelchair or bedside chair)?: A Little Help needed to walk in hospital room?: A Little Help needed climbing 3-5 steps with a railing? : A Little 6 Click Score: 18    End of Session   Activity Tolerance: Patient tolerated treatment well Patient left: in chair;with call bell/phone within  reach;with chair alarm set Nurse Communication: Mobility status PT Visit Diagnosis: Unsteadiness on feet (R26.81);Other abnormalities of gait and mobility (R26.89);Muscle weakness (generalized) (M62.81);Difficulty in walking, not elsewhere classified (R26.2)    Time: 8880-8846 PT Time Calculation (min) (ACUTE ONLY): 34 min   Charges:   PT Evaluation $PT Eval Low Complexity: 1 Low PT Treatments $Therapeutic Activity: 8-22 mins PT General Charges $$ ACUTE PT VISIT: 1 Visit          Janell Axe, SPT

## 2024-03-15 NOTE — Progress Notes (Signed)
 Updated pt's 2 daughters at bedside on plan of care.  All questions answered, they are appreciative of update.       Inge Lecher, AGACNP-BC [facility] Prefer epic messenger for cross cover needs If after hours, please call E-link

## 2024-03-15 NOTE — TOC Progression Note (Signed)
 Transition of Care [facility]) - Progression Note    Patient Details  Name: TEEGAN GUINTHER MRN: [id number] Date of Birth: [birth year]  Transition of Care [facility]) CM/SW Contact  K'La JINNY Ruts, LCSW Phone Number: 03/15/2024, 9:34 AM  Clinical Narrative:    Chart reviewed. I spoke with the patient daughters yesterday and they ha a concern that their older sister, Jon Rogue would come to the hospital and upset the patient. The daughters were inquiring about any precautions to be put into place so the older sister could not see the patient. I directed the patient daughters to the providing staff for assistance.      Barriers to Discharge: Continued Medical Work up               Expected Discharge Plan and Services       Living arrangements for the past 2 months: Single Family Home                                       Social Drivers of Health (SDOH) Interventions SDOH Screenings   Food Insecurity: Patient Unable To Answer (01/25/2024)  Housing: Unknown (01/25/2024)  Transportation Needs: Patient Unable To Answer (01/25/2024)  Utilities: Patient Unable To Answer (01/25/2024)  Depression (PHQ2-9): Low Risk  (06/22/2023)  Financial Resource Strain: Patient Declined (11/05/2022)   Received from [facility]  Social Connections: Patient Unable To Answer (01/25/2024)  Tobacco Use: Medium Risk (03/13/2024)    Readmission Risk Interventions    03/14/2024    3:19 PM 03/01/2023   10:20 AM  Readmission Risk Prevention Plan  Transportation Screening Complete Complete  PCP or Specialist Appt within 3-5 Days Complete Complete  HRI or Home Care Consult  Complete  Social Work Consult for Recovery Care Planning/Counseling  Complete  Palliative Care Screening Not Applicable Not Applicable  Medication Review Oceanographer) Complete Complete

## 2024-03-15 NOTE — Progress Notes (Signed)
 [facility]  Daily Progress Note   Subjective  -   Much more awake and alert this morning.  Seems to be at her baseline.  No obvious deficits noted.  Was on Precedex  yesterday evening and overnight and has been calm since.  MRI reviewed with tiny punctate right hemispheric areas of ischemia.  Objective Vitals:   03/15/24 0352 03/15/24 0500 03/15/24 0600 03/15/24 0700  BP: (!) 122/51 (!) 117/48 (!) 125/46 (!) 132/50  Pulse: (!) 39 (!) 53 (!) 50   Resp: 20 19 19 19   Temp: 98.8 F (37.1 C)   98.7 F (37.1 C)  TempSrc: Oral     SpO2: 98% 92% 96% 99%  Weight:      Height:        Intake/Output Summary (Last 24 hours) at 03/15/2024 0831 Last data filed at 03/15/2024 0600 Gross per 24 hour  Intake 596.8 ml  Output 900 ml  Net -303.2 ml    PULM  CTAB CV  RRR VASC  neuro exam seems to be at baseline.  Groin access site without hematoma.  Laboratory CBC    Component Value Date/Time   WBC 8.0 03/15/2024 0424   HGB 10.9 (L) 03/15/2024 0424   HCT 33.4 (L) 03/15/2024 0424   PLT 178 03/15/2024 0424    BMET    Component Value Date/Time   NA 140 03/15/2024 0424   K 4.0 03/15/2024 0424   CL 100 03/15/2024 0424   CO2 29 03/15/2024 0424   GLUCOSE 158 (H) 03/15/2024 0424   BUN 23 03/15/2024 0424   CREATININE 1.28 (H) 03/15/2024 0424   CALCIUM  9.4 03/15/2024 0424   GFRNONAA 42 (L) 03/15/2024 0424    Assessment/Planning: POD #2 s/p right carotid stent  Awake alert and much more to her baseline this morning Tiny punctate infarcts reported on the MRI.  I am not sure that really explains her clinical presentation yesterday, but given that and her overall situation I think aspirin  and Plavix  rather than Eliquis  and Plavix  may be a better option to avoid any hemorrhagic conversion. Would get PT and OT to see and assess today to try to improve status and determine home safety    Selinda Gu  03/15/2024, 8:31 AM

## 2024-03-15 NOTE — Care Management Important Message (Signed)
 Important Message  Patient Details  Name: Brooke Davis MRN: [id number] Date of Birth: Jan 02, 1941   Important Message Given:  Yes - Medicare IM     [name] 03/15/2024, 12:44 PM

## 2024-03-15 NOTE — TOC Progression Note (Addendum)
 Transition of Care [facility]) - Progression Note    Patient Details  Name: Brooke Davis MRN: [id number] Date of Birth: 02-14-1941  Transition of [facility]) CM/SW Contact  K'La JINNY Ruts, LCSW Phone Number: 03/15/2024, 3:06 PM  Clinical Narrative:    Chart reviewed. I spoke with the patient daughters and they wanted Medical life alert information though [place]. I provided the patient daughters with contact information for medical life alert with [place] and additional options.   I also spoke with Shaun with [facility] and he reports that they will accept the patient for PT/OT if needed.   Waiting for PT consult to be complete.   TOC will continue to monitor.      Barriers to Discharge: Continued Medical Work up               Expected Discharge Plan and Services       Living arrangements for the past 2 months: Single Family Home                                       Social Drivers of Health (SDOH) Interventions SDOH Screenings   Food Insecurity: Patient Unable To Answer (01/25/2024)  Housing: Unknown (01/25/2024)  Transportation Needs: Patient Unable To Answer (01/25/2024)  Utilities: Patient Unable To Answer (01/25/2024)  Depression (PHQ2-9): Low Risk  (06/22/2023)  Financial Resource Strain: Patient Declined (11/05/2022)   Received from [facility]  Social Connections: Patient Unable To Answer (01/25/2024)  Tobacco Use: Medium Risk (03/13/2024)    Readmission Risk Interventions    03/14/2024    3:19 PM 03/01/2023   10:20 AM  Readmission Risk Prevention Plan  Transportation Screening Complete Complete  PCP or Specialist Appt within 3-5 Days Complete Complete  HRI or Home Care Consult  Complete  Social Work Consult for Recovery Care Planning/Counseling  Complete  Palliative Care Screening Not Applicable Not Applicable  Medication Review Ocean[facility]) Complete Complete

## 2024-03-15 NOTE — Consult Note (Signed)
 PHARMACY CONSULT NOTE - ELECTROLYTES  Pharmacy Consult for Electrolyte Monitoring and Replacement   Recent Labs: Potassium (mmol/L)  Date Value  03/15/2024 4.0   Magnesium  (mg/dL)  Date Value  91/72/7974 3.1 (H)   Calcium  (mg/dL)  Date Value  91/72/7974 9.4   Albumin (g/dL)  Date Value  91/73/7974 3.6   Phosphorus (mg/dL)  Date Value  91/72/7974 4.1   Sodium (mmol/L)  Date Value  03/15/2024 140   Corrected Ca: 9.7 mg/dL  Height: 5' 2 (842.4 cm) Weight: 84.4 kg (186 lb 1.1 oz) IBW/kg (Calculated) : 50.1 Estimated Creatinine Clearance: 34.7 mL/min (A) (by C-G formula based on SCr of 1.28 mg/dL (H)).  Assessment  Brooke Davis is a 83 y.o. female presenting with seizure-like activity following placement of stent in R carotid artery. PMH significant for STEMI, HTN, T2DM, embolic stroke, Afib, and diastolic HF. Pharmacy has been consulted to monitor and replace electrolytes.  Diet: seizure precautions MIVF: N/A Pertinent medications: N/A  Goal of Therapy: Electrolytes within normal limits  Plan:  Continue potassium chloride  10 mEq PO daily (home med) No additional supplementation recommended at this time  Continue to monitor electrolytes daily   Thank you for allowing pharmacy to be a part of this patient's care.  Anndrea Mihelich [place], PharmD Candidate  03/15/2024 7:44 AM

## 2024-03-15 NOTE — Progress Notes (Addendum)
 NAME:  Brooke Davis, MRN:  [id number], DOB:  [birth year], LOS: 2 ADMISSION DATE:  03/13/2024, CONSULTATION DATE: 03/14/2024 REFERRING MD: Dr. Marea, CHIEF COMPLAINT: Seizure-like activity    Brief Pt Description / Synopsis:  83 y.o. 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   Course complicated by seizure like activity and Acute Metabolic Encephalopathy (likely postictal delirium) and multiple scattered acute small punctate right hemispheric ischemic strokes.  History of Present Illness:  This is an 83 yo female with a PMH of atrial fibrillation on apixaban , right cartid artery stenosis, CHF, COPD, CAD, type II diabetes mellitus, HTN, HLD, and CKD.  Pt has a hx of symptomatic high grade right artery stenosis and presented to [facility] ER on 08/25 to undergo right carotid endarterectomy with stenting.  Post procedure pt stable and admitted to ICU per vascular surgery.  Pt previously presented to [facility] ER on 03/11/24 with hypertension sbp in the 200's despite following her PCP doubling the dose of her outpatient antihypertensives.  During that presentation while waiting to be seen pts sbp remained in the 140's, pt asymptomatic, and lab results were reassuring per EDP notes.  Pt did not requiring hospital admission and was given strict return precautions.    Hospital Course On 08/26 pt was started back on her apixaban  the morning of 08/26 after being off this medication for 3 days prior to procedure. pt initially alert and oriented sitting on the  side of the bed, but suddenly her entire body became tense, her teeth were clamped down, her eyes were rolled back, and she was unable to speak or follow commands.  This episode lasted for 2 minutes and following the episode pt body relaxed and she became postictal.  PCCM team consulted.  Pt received 2 mg of iv versed  and 1000 mg loading dose of keppra .  Stat CT Head, EEG, and neurology consulted by PCCM team.  Pt has no known hx of seizure activity.  She recently requiring hospitalization at [facility] on 07/07 to 07/10 with stroke-like symptoms (aphasia and left facial droop) she received TNK.  MRI Brain negative for acute intracranial abnormality.  During that admission pt instructed to restart aspirin  and eliquis , but stop plavix .    Pertinent  Medical History  Paroxysmal atrial fibrillation on apixaban  Right Carotid Artery Stenosis CHF COPD CAD Type II diabetes mellitus  HTN HLD CKD  Micro Data:  8/25; MRSA PCR>> negative  Antimicrobials:   Anti-infectives (From admission, onward)    Start     Dose/Rate Route Frequency Ordered Stop   03/14/24 2000  Ampicillin -Sulbactam (UNASYN ) 3 g in sodium chloride  0.9 % 100 mL IVPB        3 g 200 mL/hr over 30 Minutes Intravenous Every 6 hours 03/14/24 1936     03/13/24 1930  ceFAZolin  (ANCEF ) IVPB 2g/100 mL premix        2 g 200 mL/hr over 30 Minutes Intravenous Every 8 hours 03/13/24 1251 03/14/24 0442   03/13/24 0942  ceFAZolin  (ANCEF ) IVPB 2g/100 mL premix        2 g 200 mL/hr over  30 Minutes Intravenous 30 min pre-op 03/13/24 9057 03/13/24 1209      Significant Hospital Events: Including procedures, antibiotic start and stop dates in addition to other pertinent events   08/25: Admitted to ICU stable post carotid endarterectomy with stenting 08/26: Pt developed seizure-like symptoms and became postictal.  PCCM team           consulted to assist with management,  Stat neurology consulted ordered.  CT Head       revealed no acute  intracranial abnormality, but showed extensive chronic small        vessel ischemic disease. Stat EEG without seizures, suggestive of moderate to diffuse encephalopathy. 08/27: MRI overnight showed multiple scattered punctate acute ischemic infarcts of the right cerebral hemisphere. Mentation much improved, off Precedex .  Hemodynamically stable, consult PT/OT.  Deescalate aspiration coverage to Ceftriaxone .   Interim History / Subjective:  As outlined above under Significant Hospital Events section  Objective    Blood pressure (!) 132/50, pulse (!) 50, temperature 98.7 F (37.1 C), resp. rate 19, height 5' 2 (1.575 m), weight 84.4 kg, SpO2 99%.        Intake/Output Summary (Last 24 hours) at 03/15/2024 0757 Last data filed at 03/15/2024 0600 Gross per 24 hour  Intake 596.8 ml  Output 900 ml  Net -303.2 ml   Filed Weights   03/13/24 0957 03/13/24 1303  Weight: 84.6 kg 84.4 kg    Examination: General: Acutely-ill appearing female, laying in bed, asleep, on 3L [place], in NAD HENT: Supple, no JVD  Lungs: Clear throughout, even, non labored  Cardiovascular: Bradycardia, regular rhythm, s1s2, no m/r/g, 2+ radial/1+ distal pulses, no edema  Abdomen: +BS x4, obese, soft, non tender, non distended  Extremities: Moves all extremities  Neuro: Sleeping, arouses easilty to voice, oriented x2, Moves all extremities to commands, no focal deficits noted, speech clear, bilateral pupils dilated but reactive/sluggish  GU: Purewick present   Resolved problem list   Assessment and Plan   #Acute encephalopathy  #Seizure-like activity #Acute CVA Hx: Anxiety  -CT Head 08/26:  No acute intracranial abnormality. Extensive chronic small vessel ischemic disease. -EEG 8/26: negative for seizures, suggestive of moderate to severe encephalopathy -MRI Brain 8/26: Multiple scattered punctate acute ischemic nonhemorrhagic infarcts involving the right cerebral hemisphere -Treatment of metabolic  derangements as outlined below -Provide supportive care -Promote normal sleep/wake cycle and family presence -Avoid sedating medications as able -Precedex  has been weaned off -Neurology following, appreciate input ~ currently on Eliquis  and Plavix  for A. Fib and carotid stent ~ felt that risk of hemorrhagic conversion to be low and benefit/risk favors continuing anticoagulation  -Repeat Echocardiogram  -She does take 0.25 mg of po xanax  at night, however she received a dose of 0.25 mg of xanax  at 04:00 am, low suspicion for benzo withdrawal  -Seizure precautions  -Continue Keppra  500 mg BID  #Symptomatic high grade right artery stenosis s/p right carotid endarterectomy with stenting #Asymptomatic Bradycardia ~ suspect due to stretching of carotid bulb baroreceptors with stenting, along with Precedex   #HTN  #CAD  Hx: Paroxysmal atrial fibrillation, HLD, inferior wall STEMI, and CVA  -Continuous cardiac monitoring -Maintain MAP >65 -IV fluids -Vasopressors as needed to maintain MAP goal ~ not requiring  -Lactic acid has normalized -Continue Eliquis  and Plavix   -Decrease home Metoprolol  to 12.5 mg BID  #COPD without exacerbation  - Supplemental O2 for dyspnea and/or hypoxia  - Maintain O2 sats 88% to 92% - Scheduled and prn bronchodilator therapy  - ABG  within normal limits   #Acute kidney injury  #Hypokalemia ~ RESOLVED -Monitor I&O's / urinary output -Follow BMP -Ensure adequate renal perfusion -Avoid nephrotoxic agents as able -Replace electrolytes as indicated ~ Pharmacy following for assistance with electrolyte replacement  #Anemia without obvious signs of bleeding  - Trend CBC  - Monitor for s/sx of bleeding  - Transfuse for hgb <7 - Folate and vitamin B12 levels pending   #Type II diabetes mellitus  - CBG's q4hrs  - Follow hypo/hyperglycemic protocol  - Target CBG readings 140 to 180    Best Practice (right click and Reselect all SmartList Selections daily)    Diet/type: Regular  DVT prophylaxis Apixaban   Pressure ulcer(s): N/A GI prophylaxis: N/A Lines: N/A Foley:  N/A Code Status:  full code Last date of multidisciplinary goals of care discussion [8/27]  8/27: Pt updated at bedside.  Will update pt's daughters when they arrive at bedside.   Labs   CBC: Recent Labs  Lab 03/11/24 1731 03/14/24 0511 03/14/24 1524 03/15/24 0424  WBC 6.2 5.5 11.1* 8.0  NEUTROABS 4.0  --  10.3*  --   HGB 11.7* 10.1* 11.7* 10.9*  HCT 35.0* 30.2* 34.7* 33.4*  MCV 105.1* 103.8* 105.2* 104.0*  PLT 221 190 214 178    Basic Metabolic Panel: Recent Labs  Lab 03/11/24 1731 03/13/24 1003 03/14/24 0511 03/14/24 1524 03/15/24 0424  NA 137  --  139 136 140  K 3.9  --  3.4* 3.9 4.0  CL 97*  --  99 97* 100  CO2 28  --  27 25 29   GLUCOSE 140*  --  114* 170* 158*  BUN 32* 29* 26* 22 23  CREATININE 1.12* 1.10* 1.30* 1.29* 1.28*  CALCIUM  9.6  --  9.1 9.3 9.4  MG  --   --   --  1.6* 3.1*  PHOS  --   --   --  4.0 4.1   GFR: Estimated Creatinine Clearance: 34.7 mL/min (A) (by C-G formula based on SCr of 1.28 mg/dL (H)). Recent Labs  Lab 03/11/24 1731 03/14/24 0511 03/14/24 1524 03/14/24 1611 03/14/24 1933 03/15/24 0042 03/15/24 0424  WBC 6.2 5.5 11.1*  --   --   --  8.0  LATICACIDVEN  --   --   --  2.7* 2.3* 1.5  --     Liver Function Tests: Recent Labs  Lab 03/14/24 1524  AST 38  ALT 10  ALKPHOS 107  BILITOT 1.3*  PROT 7.3  ALBUMIN 3.6   No results for input(s): LIPASE, AMYLASE in the last 168 hours. No results for input(s): AMMONIA in the last 168 hours.  ABG    Component Value Date/Time   PHART 7.42 03/14/2024 1359   PCO2ART 45 03/14/2024 1359   PO2ART 86 03/14/2024 1359   HCO3 29.2 (H) 03/14/2024 1359   TCO2 27 02/18/2021 1252   O2SAT 97.9 03/14/2024 1359     Coagulation Profile: Recent Labs  Lab 03/14/24 1611  INR 1.1    Cardiac Enzymes: No results for input(s): CKTOTAL, CKMB, CKMBINDEX, TROPONINI  in the last 168 hours.  HbA1C: Hgb A1c MFr Bld  Date/Time Value Ref Range Status  01/24/2024 09:07 PM 5.5 4.8 - 5.6 % Final    Comment:    (NOTE) Diagnosis of Diabetes The following HbA1c ranges recommended by the American Diabetes Association (ADA) may be used as an aid in the diagnosis of diabetes mellitus.  Hemoglobin  Suggested A1C NGSP%              Diagnosis  <5.7                   Non Diabetic  5.7-6.4                Pre-Diabetic  >6.4                   Diabetic  <7.0                   Glycemic control for                       adults with diabetes.    01/05/2023 10:51 AM 6.1 (H) 4.8 - 5.6 % Final    Comment:    (NOTE) Pre diabetes:          5.7%-6.4%  Diabetes:              >6.4%  Glycemic control for   <7.0% adults with diabetes     CBG: Recent Labs  Lab 03/14/24 1632 03/14/24 2034 03/15/24 0008 03/15/24 0345 03/15/24 0717  GLUCAP 170* 180* 173* 151* 130*    Review of Systems:   Unable to assess due to acute encephalopathy   Past Medical History:  She,  has a past medical history of Anxiety, Arthritis, Atrial fibrillation (HCC) (11/10/2013), Bronchitis, Carotid artery stenosis, Cerebrovascular small vessel disease (chronic), CHF (congestive heart failure) (HCC), COPD (chronic obstructive pulmonary disease) (HCC), Coronary artery disease involving native coronary artery of native heart (01/11/2021), DDD (degenerative disc disease), lumbar (12/01/2013), Diastolic dysfunction (01/12/2021), Diet-controlled type 2 diabetes mellitus (HCC), Full dentures, Gout, History of bilateral cataract extraction (2021), Hyperlipidemia, Hypertension, essential, Long term current use of anticoagulant, PONV (postoperative nausea and vomiting), Renal disorder, Sinoatrial node dysfunction (HCC) (11/10/2013), 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) (01/11/2021), Stroke (HCC), Varicose veins of legs, and Wears hearing aid in both ears.    Surgical History:   Past Surgical History:  Procedure Laterality Date   BREAST BIOPSY Left 2011   NEG   CARDIAC CATHETERIZATION     over 20 yrs ago.  All OK.   CAROTID PTA/STENT INTERVENTION Left 02/25/2023   Procedure: CAROTID PTA/STENT INTERVENTION;  Surgeon: Marea Selinda RAMAN, MD;  Location: [facility];  Service: Cardiovascular;  Laterality: Left;   CAROTID PTA/STENT INTERVENTION Right 03/13/2024   Procedure: CAROTID PTA/STENT INTERVENTION;  Surgeon: Marea Selinda RAMAN, MD;  Location: [facility];  Service: Cardiovascular;  Laterality: Right;   CATARACT EXTRACTION W/PHACO Left 03/27/2020   Procedure: CATARACT EXTRACTION PHACO AND INTRAOCULAR LENS PLACEMENT (IOC) LEFT 7.49  00:56.4  13.3%;  Surgeon: Mittie Gaskin, MD;  Location: [facility];  Service: Ophthalmology;  Laterality: Left;   CATARACT EXTRACTION W/PHACO Right 04/24/2020   Procedure: CATARACT EXTRACTION PHACO AND INTRAOCULAR LENS PLACEMENT (IOC) RIGHT;  Surgeon: Mittie Gaskin, MD;  Location: [facility];  Service: Ophthalmology;  Laterality: Right;  8.34 0:50.2 16.6%   CORONARY/GRAFT ACUTE MI REVASCULARIZATION N/A 01/11/2021   Procedure: Coronary/Graft Acute MI Revascularization;  Surgeon: Ammon Blunt, MD;  Location: [facility];  Service: Cardiovascular;  Laterality: N/A;   INCISION AND DRAINAGE Right 03/21/2023   Procedure: RIGHT HIP IRRIGATION AND DEBRIDEMENT OF INFECTED TOTAL JOINT, EXCHANGE OF HEAD AND POLY COMPONENTS WITH PLACEMENT OF ANTIBIOTIC BEADS;  Surgeon: Zafonte, Brian Thomas, MD;  Location: [facility];  Service: Orthopedics;  Laterality: Right;   LEFT HEART CATH AND CORONARY ANGIOGRAPHY N/A 01/11/2021   Procedure: LEFT HEART CATH AND CORONARY ANGIOGRAPHY;  Surgeon: Ammon Blunt, MD;  Location: [facility];  Service: Cardiovascular;  Laterality: N/A;   PARTIAL HYSTERECTOMY     TOE SURGERY Left    great toe; rod in place   TOTAL HIP ARTHROPLASTY  Right 01/18/2023   Procedure: TOTAL HIP ARTHROPLASTY;  Surgeon: Mardee Lynwood SQUIBB, MD;  Location: [facility];  Service: Orthopedics;  Laterality: Right;   TOTAL KNEE ARTHROPLASTY Right 2007     Social History:   reports that she has quit smoking. She has never used smokeless tobacco. She reports that she does not currently use alcohol. She reports that she does not use drugs.   Family History:  Her family history includes Alcoholism in her brother; Diabetes in her father; Heart attack (age of onset: 53) in her mother; Stroke in her father. There is no history of Breast cancer.   Allergies Allergies  Allergen Reactions   Celebrex [Celecoxib] Diarrhea   Penicillins Itching    IgE = 154 (WNL) on 01/05/2023   Relafen [Nabumetone]     Palpitation    Sulfa Antibiotics    Chlorhexidine  Rash   Lovastatin Diarrhea   Tape Rash     Home Medications  Prior to Admission medications   Medication Sig Start Date End Date Taking? Authorizing Provider  allopurinol  (ZYLOPRIM ) 300 MG tablet Take 1 tablet (300 mg total) by mouth daily. 02/10/23  Yes Angiulli, Toribio PARAS, PA-C  ALPRAZolam  (XANAX ) 0.25 MG tablet Take 1 tablet (0.25 mg total) by mouth 2 (two) times daily as needed for anxiety. 04/01/23  Yes Dorinda Drue DASEN, MD  aspirin  81 MG chewable tablet Chew 1 tablet (81 mg total) by mouth daily. 01/28/24 04/27/24 Yes Dezii, Alexandra, DO  citalopram  (CELEXA ) 20 MG tablet Take 1 tablet (20 mg total) by mouth daily. 02/10/23  Yes Angiulli, Toribio PARAS, PA-C  metoprolol  tartrate (LOPRESSOR ) 25 MG tablet Take 1 tablet (25 mg total) by mouth 2 (two) times daily. 02/10/23  Yes Angiulli, Toribio PARAS, PA-C  potassium chloride  (KLOR-CON ) 10 MEQ tablet Take 1 tablet (10 mEq total) by mouth daily. 02/10/23  Yes Angiulli, Toribio PARAS, PA-C  torsemide  (DEMADEX ) 20 MG tablet Take 1 tablet (20 mg total) by mouth daily. 02/10/23  Yes Angiulli, Toribio PARAS, PA-C  acetaminophen  (TYLENOL ) 325 MG tablet Take 2 tablets (650 mg total) by mouth every  6 (six) hours as needed for mild pain (or Fever >/= 101). 02/10/23   Angiulli, Toribio PARAS, PA-C  apixaban  (ELIQUIS ) 5 MG TABS tablet Take 1 tablet (5 mg total) by mouth 2 (two) times daily. 01/27/24 04/26/24  Dezii, Alexandra, DO  atorvastatin  (LIPITOR ) 80 MG tablet Take 1 tablet (80 mg total) by mouth daily. 03/01/23   Pace, Gwendlyn SAUNDERS, NP  ciprofloxacin  (CIPRO ) 500 MG tablet Take 1 tablet (500 mg total) by mouth 2 (two) times daily. 04/27/23   Fayette Bodily, MD  clopidogrel  (PLAVIX ) 75 MG tablet TAKE 1 TABLET(75 MG) BY MOUTH DAILY 01/14/24   Marea Selinda RAMAN, MD  losartan  (COZAAR ) 50 MG tablet Take 1 tablet (50 mg total) by mouth daily for 25 days. 01/28/24 02/22/24  Dezii, Alexandra, DO  melatonin 5 MG TABS Take 1 tablet (5 mg total) by mouth at bedtime. 02/10/23   Angiulli, Daniel J, PA-C  nystatin  (MYCOSTATIN /NYSTOP ) powder Apply 1 Application topically 3 (three) times daily. 05/07/23   Fayette Bodily, MD  traMADol  (ULTRAM )  50 MG tablet Take 50 mg by mouth every 4 (four) hours as needed for moderate pain (pain score 4-6).    [provider]     Critical care time: 40 minutes     Inge Lecher, AGACNP-BC [facility] Prefer epic messenger for cross cover needs If after hours, please call E-link

## 2024-03-16 DIAGNOSIS — Z95828 Presence of other vascular implants and grafts: Secondary | ICD-10-CM

## 2024-03-16 DIAGNOSIS — Z9889 Other specified postprocedural states: Secondary | ICD-10-CM

## 2024-03-16 DIAGNOSIS — I6521 Occlusion and stenosis of right carotid artery: Secondary | ICD-10-CM | POA: Diagnosis not present

## 2024-03-16 LAB — CBC
HCT: 30.3 % — ABNORMAL LOW (ref 36.0–46.0)
Hemoglobin: 10 g/dL — ABNORMAL LOW (ref 12.0–15.0)
MCH: 34.5 pg — ABNORMAL HIGH (ref 26.0–34.0)
MCHC: 33 g/dL (ref 30.0–36.0)
MCV: 104.5 fL — ABNORMAL HIGH (ref 80.0–100.0)
Platelets: 177 K/uL (ref 150–400)
RBC: 2.9 MIL/uL — ABNORMAL LOW (ref 3.87–5.11)
RDW: 13.9 % (ref 11.5–15.5)
WBC: 9.1 K/uL (ref 4.0–10.5)
nRBC: 0 % (ref 0.0–0.2)

## 2024-03-16 LAB — RENAL FUNCTION PANEL
Albumin: 3.1 g/dL — ABNORMAL LOW (ref 3.5–5.0)
Anion gap: 10 (ref 5–15)
BUN: 27 mg/dL — ABNORMAL HIGH (ref 8–23)
CO2: 26 mmol/L (ref 22–32)
Calcium: 9.1 mg/dL (ref 8.9–10.3)
Chloride: 102 mmol/L (ref 98–111)
Creatinine, Ser: 1.13 mg/dL — ABNORMAL HIGH (ref 0.44–1.00)
GFR, Estimated: 49 mL/min — ABNORMAL LOW (ref >60)
Glucose, Bld: 140 mg/dL — ABNORMAL HIGH (ref 70–99)
Phosphorus: 2.9 mg/dL (ref 2.5–4.6)
Potassium: 3.6 mmol/L (ref 3.5–5.1)
Sodium: 138 mmol/L (ref 135–145)

## 2024-03-16 LAB — MAGNESIUM: Magnesium: 2.6 mg/dL — ABNORMAL HIGH (ref 1.7–2.4)

## 2024-03-16 MED ORDER — HYDROCORTISONE 1 % EX CREA
TOPICAL_CREAM | Freq: Three times a day (TID) | CUTANEOUS | Status: DC | PRN
  Filled 2024-03-16: qty 28

## 2024-03-16 NOTE — Progress Notes (Signed)
 Physical Therapy Treatment Patient Details Name: Brooke Davis MRN: [id number] DOB: [birth year] Today's Date: 03/16/2024   History of Present Illness Pt is an 83 year old female admitted for elective right carotid stent placement on 03/13/24.   Course complicated by seizure like activity and Acute Metabolic Encephalopathy (likely postictal delirium) and multiple scattered acute small punctate right hemispheric ischemic strokes.         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 Pt was met seated in recliner, initial STS from recliner with RW and minA to achieve standing. Pt had c/o lightheadedness, vitals assessed in seated with HR in 70s, SpO2 95% on RA, BP 151/75. Additional STS x2 from recliner with RW and CGA. Pt reported that lightheadedness improved with movement, able to tolerate ~173ft of amb over 2 bouts with RW and CGA, brief seated rest break provided between bouts. HR remained within 70s and SpO2 improved to 98% at end of session, pt denied lightheadness. The patient would benefit from further skilled PT intervention to continue to progress towards goals.    If plan is discharge home, recommend the following: A little help with walking and/or transfers;A little help with bathing/dressing/bathroom;Assistance with cooking/housework;Assist for transportation   Can travel by private vehicle        Equipment Recommendations  None recommended by PT    Recommendations for Other Services       Precautions / Restrictions  Precautions Precautions: Fall Restrictions Weight Bearing Restrictions Per Provider Order: No     Mobility  Bed Mobility               General bed mobility comments:  (not assessed this session)    Transfers Overall transfer level: Needs assistance Equipment used: Rolling walker (2 wheels) Transfers: Sit to/from Stand Sit to Stand: Min assist, Contact guard assist           General transfer comment: x3 STS from recliner with RW. Initial attempt with minA, second and third attempts with CGA    Ambulation/Gait Ambulation/Gait assistance: Contact guard assist Gait Distance (Feet): 100 Feet Assistive device: Rolling walker (2 wheels) Gait Pattern/deviations: Step-through pattern, Wide base of support       General Gait Details: no LOB   Stairs             Wheelchair Mobility     Tilt Bed    Modified Rankin (Stroke Patients Only)       Balance Overall balance assessment: Needs assistance Sitting-balance support: Feet supported Sitting balance-Leahy Scale: Good     Standing balance support: Bilateral upper extremity supported Standing balance-Leahy Scale: Fair                              Hotel manager: No apparent difficulties Factors Affecting Communication: Hearing impaired  Cognition Arousal: Alert Behavior During Therapy: WFL for tasks assessed/performed, Impulsive   PT - Cognitive impairments: No apparent impairments  Following commands: Intact      Cueing Cueing Techniques: Verbal cues, Tactile cues, Visual cues  Exercises Other Exercises Other Exercises: HR remained within the 70s throughout session, SpO2 95-100% with pt on RA. BP initially 145/67 at rest, re-assessed after initial STS 2/2 pt c/o feeling lightheaded- BP 151/75.    General Comments General comments (skin integrity, edema, etc.): vss throughout      Pertinent Vitals/Pain Pain  Assessment Pain Assessment: No/denies pain    Home Living                          Prior Function            PT Goals (current goals can now be found in the care plan section) Progress towards PT goals: Progressing toward goals    Frequency    Min 3X/week      PT Plan      Co-evaluation              AM-PAC PT 6 Clicks Mobility   Outcome Measure  Help needed turning from your back to your side while in a flat bed without using bedrails?: A Little Help needed moving from lying on your back to sitting on the side of a flat bed without using bedrails?: A Little Help needed moving to and from a bed to a chair (including a wheelchair)?: A Little Help needed standing up from a chair using your arms (e.g., wheelchair or bedside chair)?: A Little Help needed to walk in hospital room?: A Little Help needed climbing 3-5 steps with a railing? : A Little 6 Click Score: 18    End of Session Equipment Utilized During Treatment: Gait belt Activity Tolerance: Patient tolerated treatment well Patient left: in chair;with call bell/phone within reach;with chair alarm set;with family/visitor present Nurse Communication: Mobility status PT Visit Diagnosis: Unsteadiness on feet (R26.81);Other abnormalities of gait and mobility (R26.89);Muscle weakness (generalized) (M62.81);Difficulty in walking, not elsewhere classified (R26.2)     Time: 8874-8851 PT Time Calculation (min) (ACUTE ONLY): 23 min  Charges:    $Therapeutic Activity: 23-37 mins PT General Charges $$ ACUTE PT VISIT: 1 Visit                     Juliocesar Blasius, SPT

## 2024-03-16 NOTE — Progress Notes (Signed)
 Patient stable and A+Ox4, transferred to room 125 in 1C with all belongings and accompanied by daughters. All care transferred.

## 2024-03-16 NOTE — Progress Notes (Signed)
 Progress Note    03/16/2024 10:03 AM 3 Days Post-Op  Subjective:  Brooke Davis is an 83 yo female now POD #3 from:  PROCEDURE:  Ultrasound guidance for vascular access right femoral artery  Placement of a 9 mm proximal, 7 mm distal, 4 cm long exact stent with the use of the NAV-6 embolic protection device in the right carotid artery  At this time the patient seems to be at her baseline.  There is no obvious deficits noted.  Patient remains off Precedex  and has been calm.  Her MRI was reviewed by Dr. Selinda Gu yesterday noting a tiny punctate right hemispheric area of ischemia.  No other complaints overnight vitals are stable.   Vitals:   03/16/24 0600 03/16/24 0800  BP: 138/61 (!) 140/58  Pulse:    Resp: 20 19  Temp:  98.7 F (37.1 C)  SpO2:  95%   Physical Exam: Cardiac:  RRR, normal S1 and S2.  No murmurs appreciated. Lungs: Clear throughout on auscultation.  Normal labored breathing.  No rales rhonchi or wheezing. Incisions: Right groin access without hematoma seroma or infection no. Extremities: All extremities warm to touch and with palpable pulses. Abdomen: Positive bowel sounds throughout, soft, nontender nondistended. Neurologic: Alert and oriented x 3, answers all questions and follows commands appropriately.  No focal deficits.  All extremities 5 out of 5 strength with good sensation.  CBC    Component Value Date/Time   WBC 9.1 03/16/2024 0320   RBC 2.90 (L) 03/16/2024 0320   HGB 10.0 (L) 03/16/2024 0320   HCT 30.3 (L) 03/16/2024 0320   PLT 177 03/16/2024 0320   MCV 104.5 (H) 03/16/2024 0320   MCH 34.5 (H) 03/16/2024 0320   MCHC 33.0 03/16/2024 0320   RDW 13.9 03/16/2024 0320   LYMPHSABS 0.5 (L) 03/14/2024 1524   MONOABS 0.3 03/14/2024 1524   EOSABS 0.0 03/14/2024 1524   BASOSABS 0.0 03/14/2024 1524    BMET    Component Value Date/Time   NA 138 03/16/2024 0320   K 3.6 03/16/2024 0320   CL 102 03/16/2024 0320   CO2 26 03/16/2024 0320   GLUCOSE  140 (H) 03/16/2024 0320   BUN 27 (H) 03/16/2024 0320   CREATININE 1.13 (H) 03/16/2024 0320   CALCIUM  9.1 03/16/2024 0320   GFRNONAA 49 (L) 03/16/2024 0320    INR    Component Value Date/Time   INR 1.1 03/14/2024 1611     Intake/Output Summary (Last 24 hours) at 03/16/2024 1003 Last data filed at 03/16/2024 0100 Gross per 24 hour  Intake 230 ml  Output 400 ml  Net -170 ml     Assessment/Plan:  83 y.o. female is s/p SEE ABOVE  3 Days Post-Op   PLAN Awake and alert this morning back to her baseline. MRI with noted punctate infarcts.  I do not believe this explains her presentation postprocedure but given her overall situation Dr. Selinda Gu MD has recommended aspirin  81 mg daily and Plavix  75 mg daily rather than the patient being on Eliquis  and Plavix  to avoid any hemorrhagic conversion. Continue physical therapy and Occupational Therapy. Transfer to the floor today for further assessment.  If the patient does well today vascular surgery is okay with discharge home tomorrow.  She will need to be on aspirin  81 mg daily and Plavix  75 mg daily at home. Patient to follow-up with vein and vascular services in 3 weeks postop for check.   DVT prophylaxis: Aspirin  81 mg daily Plavix  75 mg  daily.   [name] [facility] 03/16/2024 10:03 AM

## 2024-03-16 NOTE — Progress Notes (Signed)
 Occupational Therapy Treatment Patient Details Name: Brooke Davis MRN: [id number] DOB: 1941-02-16 Today's Date: 03/16/2024   History of present illness Pt is an 83 year old female admitted for elective right carotid stent placement on 03/13/24.   Course complicated by seizure like activity and Acute Metabolic Encephalopathy (likely postictal delirium) and multiple scattered acute small punctate right hemispheric ischemic strokes.         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 Pt is mildly impulsive on this date, but able to be redirected. MAX A required for peri care due to incontinent BM, but pt is able to perform peri care from the front. Pt amb in room with RW with supervision-CGA. Pt is making progress towards goals, discharge recommendation remains appropriate.       If plan is discharge home, recommend the following:  A little help with walking and/or transfers;A little help with bathing/dressing/bathroom   Equipment Recommendations  Other (comment) (pt has recommended equipment)    Recommendations for Other Services      Precautions / Restrictions Precautions Precautions: Fall Recall of Precautions/Restrictions: Intact (but impulsive) Restrictions Weight Bearing Restrictions Per Provider Order: No       Mobility Bed Mobility Overal bed mobility: Needs Assistance Bed Mobility: Supine to Sit     Supine to sit: Supervision, HOB elevated,  Used rails          Transfers Overall transfer level: Needs assistance Equipment used: Rolling walker (2 wheels) Transfers: Sit to/from Stand Sit to Stand: Contact guard assist           General transfer comment: multiple attempts from low toilet     Balance Overall balance assessment: Needs assistance Sitting-balance support: Feet supported Sitting balance-Leahy Scale: Good     Standing balance support: Bilateral upper extremity supported, Reliant on assistive device for balance Standing balance-Leahy Scale: Fair                             ADL either performed or assessed with clinical judgement   ADL Overall ADL's : Needs assistance/impaired     Grooming: Supervision/safety;Sitting       Lower Body Bathing: Maximal assistance;Sit to/from stand       Lower Body Dressing: Maximal assistance   Toilet Transfer: Contact guard assist;Rolling walker (2 wheels);Ambulation   Toileting- Clothing Manipulation and Hygiene: Maximal assistance;Sit to/from stand Toileting - Clothing Manipulation Details (indicate cue type and reason): for thoroughness after incontinent BM     Functional mobility during ADLs: Supervision/safety;Contact guard assist;Rolling walker (2 wheels) (approx 15' in room)      Extremity/Trunk Assessment              Vision       Perception     Praxis     Communication Communication Communication: No apparent difficulties Factors Affecting Communication: Hearing impaired   Cognition Arousal: Alert Behavior During Therapy: Impulsive, WFL for tasks assessed/performed (but redirected) Cognition: No apparent impairments  Following commands: Intact        Cueing   Cueing Techniques: Verbal cues, Tactile cues  Exercises Other Exercises Other Exercises: edu re role of OT, role of rehab    Shoulder Instructions       General Comments vss throughout    Pertinent Vitals/  Pain       Pain Assessment Pain Assessment: No/denies pain  Home Living                                          Prior Functioning/Environment              Frequency  Min 3X/week        Progress Toward Goals  OT Goals(current goals can now be found in the care plan section)  Progress towards OT goals: Progressing toward goals  Acute Rehab OT Goals Time For Goal Achievement: 03/29/24  Plan      Co-evaluation                 AM-PAC OT 6 Clicks Daily Activity     Outcome Measure   Help from another person eating meals?: None Help from another person taking care of personal grooming?: None Help from another person toileting, which includes using toliet, bedpan, or urinal?: A Little Help from another person bathing (including washing, rinsing, drying)?: A Lot Help from another person to put on and taking off regular upper body clothing?: None Help from another person to put on and taking off regular lower body clothing?: A Little 6 Click Score: 20    End of Session Equipment Utilized During Treatment: Rolling walker (2 wheels)  OT Visit Diagnosis: Other abnormalities of gait and mobility (R26.89);Muscle weakness (generalized) (M62.81);Other symptoms and signs involving cognitive function   Activity Tolerance Patient tolerated treatment well   Patient Left in chair;with call bell/phone within reach;with chair alarm set;with nursing/sitter in room   Nurse Communication Mobility status        Time: 9096-9066 OT Time Calculation (min): 30 min  Charges: OT General Charges $OT Visit: 1 Visit OT Treatments $Self Care/Home Management : 8-22 mins $Therapeutic Activity: 8-22 mins  Therisa Sheffield, OTD OTR/L  03/16/24, 10:21 AM

## 2024-03-16 NOTE — Progress Notes (Signed)
 Progress Note    Brooke Davis  FMW:[id number] DOB: 01-18-1941  DOA: 03/13/2024 PCP: Valora Agent, MD      Brief Narrative:    Medical records reviewed and are as summarized below:  Brooke Davis is a 83 y.o. 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   Course complicated by seizure like activity and Acute Metabolic Encephalopathy (likely postictal delirium) and multiple scattered acute small punctate right hemispheric ischemic strokes.     Significant Hospital Events: Including procedures, antibiotic start and stop dates in addition to other pertinent events   08/25: Admitted to ICU stable post carotid endarterectomy with stenting 08/26: Pt developed seizure-like symptoms and became postictal.  PCCM team           consulted to assist with management,  Stat neurology consulted ordered.  CT Head       revealed no acute intracranial abnormality, but showed extensive chronic small        vessel ischemic disease. Stat EEG without seizures, suggestive of moderate to diffuse encephalopathy. 08/27: MRI overnight showed multiple scattered punctate acute ischemic infarcts of the right cerebral hemisphere. Mentation much improved, off Precedex .  Hemodynamically stable, consult PT/OT.  Deescalate aspiration coverage to Ceftriaxone .   Assessment/Plan:   Principal Problem:   Carotid stenosis, right    Body mass index is 34.03 kg/m.    Seizure-like activity: Continue IV Keppra .  Follow-up with neurologist for  further recommendations.   Acute metabolic encephalopathy: Resolved   Acute stroke, recent right carotid stent placement on 03/13/2024 for symptomatic high grade right artery stenosis: Continue Plavix    Paroxysmal atrial fibrillation: Continue Eliquis    AKI: Resolved Hypokalemia: Resolved Bradycardia: Resolved   Comorbidities include COPD, hyperlipidemia, CAD, hypertension, chronic anemia, type II DM   Transferred from progressive cardiac unit level of care due to MedSurg level of care   Diet Order             Diet heart healthy/carb modified Fluid consistency: Thin  Diet effective now                                  Consultants: Vascular surgeon Intensivist Neurologist  Procedures: Right carotid artery stent placement on 03/13/2024    Medications:    allopurinol   300 mg Oral Daily   apixaban   5 mg Oral BID   atorvastatin   80 mg Oral Daily   citalopram   20 mg Oral Daily   clopidogrel   75 mg Oral Daily   levETIRAcetam   500 mg Intravenous Q12H   metoprolol  tartrate  12.5 mg Oral BID   potassium chloride   10 mEq Oral Daily   Continuous Infusions:  sodium chloride        Anti-infectives (From admission, onward)    Start     Dose/Rate Route Frequency Ordered Stop   03/15/24 1400  cefTRIAXone  (ROCEPHIN ) 1 g in sodium chloride  0.9 % 100 mL IVPB  Status:  Discontinued        1 g 200 mL/hr over 30 Minutes  Intravenous Every 24 hours 03/15/24 1013 03/15/24 1831   03/14/24 2000  Ampicillin -Sulbactam (UNASYN ) 3 g in sodium chloride  0.9 % 100 mL IVPB  Status:  Discontinued        3 g 200 mL/hr over 30 Minutes Intravenous Every 6 hours 03/14/24 1936 03/15/24 1013   03/13/24 1930  ceFAZolin  (ANCEF ) IVPB 2g/100 mL premix        2 g 200 mL/hr over 30 Minutes Intravenous Every 8 hours 03/13/24 1251 03/14/24 0442   03/13/24 0942  ceFAZolin  (ANCEF ) IVPB 2g/100 mL premix        2 g 200 mL/hr over 30 Minutes Intravenous 30 min pre-op 03/13/24 9057  03/13/24 1209              Family Communication/Anticipated D/C date and plan/Code Status   DVT prophylaxis: SCD's Start: 03/13/24 1252 apixaban  (ELIQUIS ) tablet 5 mg     Code Status: Full Code  Family Communication: None Disposition Plan: Plan to discharge home tomorrow   Status is: Inpatient Remains inpatient appropriate because: Acute stroke, seizure       Subjective:   Interval events noted.  She has no complaints.  She feels better.  Corean, RN, was at the bedside  Objective:    Vitals:   03/16/24 0352 03/16/24 0500 03/16/24 0600 03/16/24 0800  BP: (!) 159/69  138/61 (!) 140/58  Pulse: 68     Resp: 15  20 19   Temp: 98.7 F (37.1 C)   98.7 F (37.1 C)  TempSrc: Oral   Axillary  SpO2: 96% 96%  95%  Weight:      Height:       No data found.   Intake/Output Summary (Last 24 hours) at 03/16/2024 0941 Last data filed at 03/16/2024 0100 Gross per 24 hour  Intake 230 ml  Output 400 ml  Net -170 ml   Filed Weights   03/13/24 0957 03/13/24 1303  Weight: 84.6 kg 84.4 kg    Exam:  GEN: NAD, sitting up in the chair SKIN: Warm and dry EYES: No pallor or icterus ENT: MMM CV: RRR PULM: CTA B ABD: soft, ND, NT, +BS CNS: AAO x 3, non focal EXT: No edema or tenderness        Data Reviewed:   I have personally reviewed following labs and imaging studies:  Labs: Labs show the following:   Basic Metabolic Panel: Recent Labs  Lab 03/11/24 1731 03/13/24 1003 03/14/24 0511 03/14/24 1524 03/15/24 0424 03/16/24 0320  NA 137  --  139 136 140 138  K 3.9  --  3.4* 3.9 4.0 3.6  CL 97*  --  99 97* 100 102  CO2 28  --  27 25 29 26   GLUCOSE 140*  --  114* 170* 158* 140*  BUN 32* 29* 26* 22 23 27*  CREATININE 1.12* 1.10* 1.30* 1.29* 1.28* 1.13*  CALCIUM  9.6  --  9.1 9.3 9.4 9.1  MG  --   --   --  1.6* 3.1* 2.6*  PHOS  --   --   --  4.0 4.1 2.9   GFR Estimated Creatinine Clearance: 39.3 mL/min (A) (by C-G formula based on SCr of 1.13  mg/dL (H)). Liver Function Tests: Recent Labs  Lab 03/14/24 1524 03/16/24 0320  AST 38  --   ALT 10  --   ALKPHOS 107  --   BILITOT 1.3*  --   PROT 7.3  --   ALBUMIN 3.6 3.1*   No results for  input(s): LIPASE, AMYLASE in the last 168 hours. No results for input(s): AMMONIA in the last 168 hours. Coagulation profile Recent Labs  Lab 03/14/24 1611  INR 1.1    CBC: Recent Labs  Lab 03/11/24 1731 03/14/24 0511 03/14/24 1524 03/15/24 0424 03/16/24 0320  WBC 6.2 5.5 11.1* 8.0 9.1  NEUTROABS 4.0  --  10.3*  --   --   HGB 11.7* 10.1* 11.7* 10.9* 10.0*  HCT 35.0* 30.2* 34.7* 33.4* 30.3*  MCV 105.1* 103.8* 105.2* 104.0* 104.5*  PLT 221 190 214 178 177   Cardiac Enzymes: No results for input(s): CKTOTAL, CKMB, CKMBINDEX, TROPONINI in the last 168 hours. BNP (last 3 results) No results for input(s): PROBNP in the last 8760 hours. CBG: Recent Labs  Lab 03/15/24 0717 03/15/24 1124 03/15/24 1633 03/15/24 2044 03/15/24 2332  GLUCAP 130* 109* 127* 142* 143*   D-Dimer: No results for input(s): DDIMER in the last 72 hours. Hgb A1c: No results for input(s): HGBA1C in the last 72 hours. Lipid Profile: No results for input(s): CHOL, HDL, LDLCALC, TRIG, CHOLHDL, LDLDIRECT in the last 72 hours. Thyroid function studies: No results for input(s): TSH, T4TOTAL, T3FREE, THYROIDAB in the last 72 hours.  Invalid input(s): FREET3 Anemia work up: Recent Labs    03/14/24 1524  VITAMINB12 410  FOLATE >46.0   Sepsis Labs: Recent Labs  Lab 03/14/24 0511 03/14/24 1524 03/14/24 1611 03/14/24 1933 03/15/24 0042 03/15/24 0424 03/15/24 1611 03/16/24 0320  PROCALCITON  --   --   --   --   --   --  <0.10  --   WBC 5.5 11.1*  --   --   --  8.0  --  9.1  LATICACIDVEN  --   --  2.7* 2.3* 1.5  --   --   --     Microbiology Recent Results (from the past 240 hours)  MRSA Next Gen by PCR, Nasal     Status: None   Collection Time: 03/13/24   1:07 PM   Specimen: Nasal Mucosa; Nasal Swab  Result Value Ref Range Status   MRSA by PCR Next Gen NOT DETECTED NOT DETECTED Final    Comment: (NOTE) The GeneXpert MRSA Assay (FDA approved for NASAL specimens only), is one component of a comprehensive MRSA colonization surveillance program. It is not intended to diagnose MRSA infection nor to guide or monitor treatment for MRSA infections. Test performance is not FDA approved in patients less than 59 years old. Performed at [facility], [address] [place], [place] [address]     Procedures and diagnostic studies:  ECHOCARDIOGRAM COMPLETE Result Date: 03/15/2024    ECHOCARDIOGRAM REPORT   Patient Name:   PRISCILA BEAN [facility] Date of Exam: 03/15/2024 Medical Rec #:  [id number]        Height:       62.0 in Accession #:    [id number]       Weight:       186.1 lb Date of Birth:  03-16-1941        BSA:          1.854 m Patient Age:    83 years         BP:           126/57 mmHg Patient Gender: F                HR:           52 bpm. Exam Location:  [facility] Procedure: 2D  Echo, Cardiac Doppler and Color Doppler (Both Spectral and Color            Flow Doppler were utilized during procedure). Indications:     Stroke I63.9  History:         Patient has prior history of Echocardiogram examinations, most                  recent 01/26/2024. Stroke; Arrythmias:Atrial Fibrillation.  Sonographer:     Ashley McNeely-Sloane Referring Phys:  [id number] INGE JONETTA LECHER Diagnosing Phys: Timothy Gollan MD IMPRESSIONS  1. Left ventricular ejection fraction, by estimation, is 60 to 65%. The left ventricle has normal function. The left ventricle has no regional wall motion abnormalities. Left ventricular diastolic parameters are indeterminate.  2. Right ventricular systolic function is normal. The right ventricular size is normal.  3. Left atrial size was moderately dilated.  4. The mitral valve is normal in structure. Mild mitral valve regurgitation. No evidence of  mitral stenosis.  5. The aortic valve is calcified. Aortic valve regurgitation is not visualized. Mild to moderate aortic valve stenosis. Aortic valve area, by VTI measures 1.10 cm. Aortic valve mean gradient measures 16.5 mmHg. Aortic valve Vmax measures 2.34 m/s.  6. The inferior vena cava is normal in size with greater than 50% respiratory variability, suggesting right atrial pressure of 3 mmHg. FINDINGS  Left Ventricle: Left ventricular ejection fraction, by estimation, is 60 to 65%. The left ventricle has normal function. The left ventricle has no regional wall motion abnormalities. Strain was performed and the global longitudinal strain is indeterminate. The left ventricular internal cavity size was normal in size. There is borderline left ventricular hypertrophy. Left ventricular diastolic parameters are indeterminate. Right Ventricle: The right ventricular size is normal. No increase in right ventricular wall thickness. Right ventricular systolic function is normal. Left Atrium: Left atrial size was moderately dilated. Right Atrium: Right atrial size was normal in size. Pericardium: There is no evidence of pericardial effusion. Mitral Valve: The mitral valve is normal in structure. Mild mitral annular calcification. Mild mitral valve regurgitation. No evidence of mitral valve stenosis. MV peak gradient, 6.5 mmHg. The mean mitral valve gradient is 3.0 mmHg. Tricuspid Valve: The tricuspid valve is normal in structure. Tricuspid valve regurgitation is mild . No evidence of tricuspid stenosis. Aortic Valve: The aortic valve is calcified. Aortic valve regurgitation is not visualized. Mild to moderate aortic stenosis is present. Aortic valve mean gradient measures 16.5 mmHg. Aortic valve peak gradient measures 21.9 mmHg. Aortic valve area, by VTI measures 1.10 cm. Pulmonic Valve: The pulmonic valve was normal in structure. Pulmonic valve regurgitation is not visualized. No evidence of pulmonic stenosis. Aorta:  The aortic root is normal in size and structure. Venous: The inferior vena cava is normal in size with greater than 50% respiratory variability, suggesting right atrial pressure of 3 mmHg. IAS/Shunts: No atrial level shunt detected by color flow Doppler. Additional Comments: 3D was performed not requiring image post processing on an independent workstation and was indeterminate.  LEFT VENTRICLE PLAX 2D LVIDd:         4.65 cm     Diastology LVIDs:         2.15 cm     LV e' medial:    7.07 cm/s LV PW:         1.10 cm     LV E/e' medial:  16.5 LV IVS:        1.00 cm     LV e' lateral:  8.59 cm/s LVOT diam:     1.90 cm     LV E/e' lateral: 13.6 LV SV:         62 LV SV Index:   33 LVOT Area:     2.84 cm  LV Volumes (MOD) LV vol d, MOD A2C: 69.3 ml LV vol d, MOD A4C: 45.5 ml LV vol s, MOD A2C: 20.7 ml LV vol s, MOD A4C: 16.1 ml LV SV MOD A2C:     48.6 ml LV SV MOD A4C:     45.5 ml LV SV MOD BP:      37.8 ml RIGHT VENTRICLE RV Basal diam:  4.70 cm RV Mid diam:    2.90 cm RV S prime:     7.51 cm/s TAPSE (M-mode): 1.9 cm LEFT ATRIUM              Index        RIGHT ATRIUM           Index LA diam:        4.15 cm  2.24 cm/m   RA Area:     27.90 cm LA Vol (A2C):   126.0 ml 67.96 ml/m  RA Volume:   103.00 ml 55.56 ml/m LA Vol (A4C):   71.4 ml  38.51 ml/m LA Biplane Vol: 101.0 ml 54.48 ml/m  AORTIC VALVE                     PULMONIC VALVE AV Area (Vmax):    1.19 cm      PV Vmax:        1.05 m/s AV Area (Vmean):   1.13 cm      PV Vmean:       72.200 cm/s AV Area (VTI):     1.10 cm      PV VTI:         0.255 m AV Vmax:           233.75 cm/s   PV Peak grad:   4.4 mmHg AV Vmean:          168.500 cm/s  PV Mean grad:   2.0 mmHg AV VTI:            0.564 m       RVOT Peak grad: 3 mmHg AV Peak Grad:      21.9 mmHg AV Mean Grad:      16.5 mmHg LVOT Vmax:         97.90 cm/s LVOT Vmean:        67.400 cm/s LVOT VTI:          0.219 m LVOT/AV VTI ratio: 0.39  AORTA Ao Root diam: 3.20 cm Ao Asc diam:  3.10 cm MITRAL VALVE                 TRICUSPID VALVE MV Area (PHT): 2.88 cm     TR Peak grad:   23.2 mmHg MV Area VTI:   1.80 cm     TR Mean grad:   19.0 mmHg MV Peak grad:  6.5 mmHg     TR Vmax:        241.00 cm/s MV Mean grad:  3.0 mmHg     TR Vmean:       214.0 cm/s MV Vmax:       1.27 m/s MV Vmean:      88.5 cm/s    SHUNTS MV Decel Time: 263 msec     Systemic VTI:  0.22 m MV E velocity: 117.00 cm/s  Systemic Diam: 1.90 cm                             Pulmonic VTI:  0.224 m Evalene Lunger MD Electronically signed by Evalene Lunger MD Signature Date/Time: 03/15/2024/5:17:51 PM    Final    MR BRAIN WO CONTRAST Result Date: 03/15/2024 CLINICAL DATA:  Initial evaluation for acute neuro deficit, stroke suspected. EXAM: MRI HEAD WITHOUT CONTRAST TECHNIQUE: Multiplanar, multiecho pulse sequences of the brain and surrounding structures were obtained without intravenous contrast. COMPARISON:  Comparison made with prior CT from earlier the same day. FINDINGS: Brain: Cerebral volume within normal limits. Patchy T2/FLAIR hyperintensity involving the periventricular and deep white matter both cerebral hemispheres as well as the pons, consistent with chronic small vessel ischemic disease, moderate to advanced in nature. Few scattered superimposed remote lacunar infarcts present about the hemispheric cerebral white matter. Small remote cortical infarct involving the left parietal lobe noted. Scattered punctate foci of restricted diffusion are seen involving the right cerebral hemisphere, consistent with small acute ischemic infarcts (series 5, images 41-16). No associated hemorrhage or mass effect. No other acute or subacute ischemic changes. Gray-white matter differentiation otherwise maintained. No acute intracranial hemorrhage. Few scattered chronic micro hemorrhages noted, likely hypertensive/small vessel related. No mass lesion, midline shift or mass effect. No hydrocephalus or extra-axial fluid collection. Pituitary gland within normal limits. No other  intrinsic temporal lobe abnormality. Vascular: Right vertebral artery markedly hypoplastic and not well seen. Major intracranial vascular flow voids are otherwise maintained. Skull and upper cervical spine: Craniocervical junction within normal limits. Bone marrow signal intensity overall within normal limits. No scalp soft tissue abnormality. Sinuses/Orbits: Prior bilateral ocular lens replacement. Paranasal sinuses are largely clear. No significant mastoid effusion. Other: None. IMPRESSION: 1. Multiple scattered punctate acute ischemic nonhemorrhagic infarcts involving the right cerebral hemisphere. 2. Underlying moderate to advanced chronic microvascular ischemic disease with a few scattered remote infarcts involving the hemispheric cerebral white matter and left parietal lobe. Electronically Signed   By: Morene Hoard M.D.   On: 03/15/2024 00:00   DG Chest Port 1 View Result Date: 03/14/2024 CLINICAL DATA:  Acute respiratory failure. EXAM: PORTABLE CHEST 1 VIEW COMPARISON:  March 18, 2023. FINDINGS: Stable cardiomediastinal silhouette. No acute pulmonary disease is noted. Bony thorax is unremarkable. IMPRESSION: No active disease. Electronically Signed   By: Lynwood Landy Raddle M.D.   On: 03/14/2024 15:24   EEG adult Result Date: 03/14/2024 Shelton Arlin KIDD, MD     03/14/2024  1:15 PM Patient Name: AMINTA SAKURAI MRN: [id number] Epilepsy Attending: Arlin KIDD Shelton Referring Physician/Provider: Maranda Lonell MATSU, NP Date: 03/14/2024 Duration: 28.48 mins Patient history: 83 year old female with seizure-like activity.  EEG to evaluate for seizure. Level of alertness: Awake/ lethargic AEDs during EEG study: versed  Technical aspects: This EEG study was done with scalp electrodes positioned according to the 10-20 International system of electrode placement. Electrical activity was reviewed with band pass filter of 1-70Hz , sensitivity of 7 uV/mm, display speed of 73mm/sec with a 60Hz  notched filter applied as  appropriate. EEG data were recorded continuously and digitally stored.  Video monitoring was available and reviewed as appropriate. Description: EEG showed continuous generalized 2-3 Hz delta slowing admixed with 13 to 15 Hz beta activity. Hyperventilation and photic stimulation were not performed.   Of note, study is technically difficult due to significant myogenic artifact ABNORMALITY - Continuous slow, generalized IMPRESSION: This  technically difficult study is suggestive of moderate to severe diffuse encephalopathy. No seizures or epileptiform discharges were seen throughout the recording. Priyanka MALVA Krebs   CT HEAD WO CONTRAST ( ) Result Date: 03/14/2024 EXAM: CT HEAD WITHOUT CONTRAST 03/14/2024 10:51:14 AM TECHNIQUE: CT of the head was performed without the administration of intravenous contrast. Automated exposure control, iterative reconstruction, and/or weight based adjustment of the mA/kV was utilized to reduce the radiation dose to as low as reasonably achievable. COMPARISON: MRI head 01/25/2024. CT head 01/24/2024. CLINICAL HISTORY: Neuro deficit, acute, stroke suspected. FINDINGS: BRAIN AND VENTRICLES: No acute hemorrhage. Gray-white differentiation is preserved. No hydrocephalus. No extra-axial collection. No mass effect or midline shift. Confluent hypodensities in the cerebral white matter bilaterally, similar to the prior CT and nonspecific but compatible with extensive chronic small vessel ischemic disease. Mild cerebral atrophy. ORBITS: Bilateral cataract extraction. SINUSES: No acute abnormality. SOFT TISSUES AND SKULL: No acute soft tissue abnormality. No skull fracture. Calcified atherosclerosis at the skull base. No hyperdense vessel. IMPRESSION: 1. No acute intracranial abnormality. 2. Extensive chronic small vessel ischemic disease. Electronically signed by: Dasie Hamburg MD 03/14/2024 11:09 AM EDT RP Workstation: [id number]               LOS: 3 days   Kentravious [place]  [facility]   Pager on [email or web address]. If 7PM-7AM, please contact night-coverage at [email or web address]     03/16/2024, 9:41 AM

## 2024-03-16 NOTE — Progress Notes (Signed)
 Patient transferred. All patient belongings kept at bedside transferred to new room with patient's daughters. AxOx4. VSS.

## 2024-03-17 DIAGNOSIS — I6521 Occlusion and stenosis of right carotid artery: Secondary | ICD-10-CM | POA: Diagnosis not present

## 2024-03-17 LAB — RENAL FUNCTION PANEL
Albumin: 3 g/dL — ABNORMAL LOW (ref 3.5–5.0)
Anion gap: 11 (ref 5–15)
BUN: 21 mg/dL (ref 8–23)
CO2: 27 mmol/L (ref 22–32)
Calcium: 9.3 mg/dL (ref 8.9–10.3)
Chloride: 103 mmol/L (ref 98–111)
Creatinine, Ser: 0.98 mg/dL (ref 0.44–1.00)
GFR, Estimated: 58 mL/min — ABNORMAL LOW (ref >60)
Glucose, Bld: 118 mg/dL — ABNORMAL HIGH (ref 70–99)
Phosphorus: 2.9 mg/dL (ref 2.5–4.6)
Potassium: 4.1 mmol/L (ref 3.5–5.1)
Sodium: 141 mmol/L (ref 135–145)

## 2024-03-17 LAB — CBC
HCT: 29.6 % — ABNORMAL LOW (ref 36.0–46.0)
Hemoglobin: 9.7 g/dL — ABNORMAL LOW (ref 12.0–15.0)
MCH: 34.4 pg — ABNORMAL HIGH (ref 26.0–34.0)
MCHC: 32.8 g/dL (ref 30.0–36.0)
MCV: 105 fL — ABNORMAL HIGH (ref 80.0–100.0)
Platelets: 171 K/uL (ref 150–400)
RBC: 2.82 MIL/uL — ABNORMAL LOW (ref 3.87–5.11)
RDW: 13.9 % (ref 11.5–15.5)
WBC: 8.1 K/uL (ref 4.0–10.5)
nRBC: 0 % (ref 0.0–0.2)

## 2024-03-17 MED ORDER — FLUTICASONE PROPIONATE 50 MCG/ACT NA SUSP
1.0000 | Freq: Every day | NASAL | Status: DC
  Filled 2024-03-17: qty 16

## 2024-03-17 MED ORDER — LEVETIRACETAM 500 MG PO TABS
500.0000 mg | ORAL_TABLET | Freq: Two times a day (BID) | ORAL | Status: DC
  Administered 2024-03-17: 500 mg via ORAL
  Filled 2024-03-17: qty 1

## 2024-03-17 MED ORDER — MELATONIN 5 MG PO TABS
5.0000 mg | ORAL_TABLET | Freq: Once | ORAL | Status: AC
  Administered 2024-03-17: 5 mg via ORAL
  Filled 2024-03-17 (×2): qty 1

## 2024-03-17 MED ORDER — METOPROLOL TARTRATE 25 MG PO TABS: 12.5000 mg | ORAL_TABLET | Freq: Two times a day (BID) | ORAL | Status: AC

## 2024-03-17 MED ORDER — LEVETIRACETAM 500 MG PO TABS: 500.0000 mg | ORAL_TABLET | Freq: Two times a day (BID) | ORAL | 0 refills | Status: AC

## 2024-03-17 NOTE — TOC Initial Note (Addendum)
 Transition of Care [facility]) - Initial/Assessment Note    Patient Details  Name: Brooke Davis MRN: [id number] Date of Birth: October 16, 1940  Transition of Care [facility]) CM/SW Contact:    [place] L Azka Steger, LCSW Phone Number: 03/17/2024, 2:36 PM  Clinical Narrative:                  Home Health orders in place. According to [facility], patient is already receiving services from [facility]. CSW contacted Shaun to confirm. Shaun was advised of new orders.      Barriers to Discharge: Continued Medical Work up   Patient Goals and CMS Choice            Expected Discharge Plan and Services       Living arrangements for the past 2 months: Single Family Home Expected Discharge Date: 03/17/24                                    Prior Living Arrangements/Services Living arrangements for the past 2 months: Single Family Home Lives with:: Adult Children, Other (Comment) (The patient lives with her nephew and his 3 children.) Patient language and need for interpreter reviewed:: Yes        Need for Family Participation in Patient Care: Yes (Comment)     Criminal Activity/Legal Involvement Pertinent to Current Situation/Hospitalization: No - Comment as needed  Activities of Daily Living   ADL Screening (condition at time of admission) Independently performs ADLs?: Yes (appropriate for developmental age) Is the patient deaf or have difficulty hearing?: Yes (wears hearing aids) Does the patient have difficulty seeing, even when wearing glasses/contacts?: No Does the patient have difficulty concentrating, remembering, or making decisions?: No  Permission Sought/Granted   Permission granted to share information with : Yes, Release of Information Signed        Permission granted to share info w Relationship: Daughers  Permission granted to share info w Contact Information: Mliss Cheese [phone] and Maeola Dross [phone]  Emotional  Assessment Appearance:: Appears stated age Attitude/Demeanor/Rapport: Unable to Assess Affect (typically observed): Unable to Assess Orientation: :  (unable to asses the patient was sedated.) Alcohol / Substance Use: Not Applicable Psych Involvement: No (comment)  Admission diagnosis:  Carotid stenosis, right [I65.21] Patient Active Problem List   Diagnosis Date Noted   Carotid stenosis, right 03/13/2024   Acute CVA (cerebrovascular accident) (HCC) 01/24/2024   ABLA (acute blood loss anemia) 03/27/2023   Iron deficiency anemia 03/27/2023   Serratia infection 03/25/2023   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 02/2023 03/19/2023   CAD S/P percutaneous coronary angioplasty 03/19/2023   Generalized weakness 03/19/2023   Chronic anticoagulation 03/19/2023   Cellulitis of right hip 03/19/2023   Sepsis (HCC) 03/18/2023   Carotid stenosis, symptomatic, with infarction (HCC) 02/25/2023   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   Atrial fibrillation, chronic (HCC) 01/28/2023   Dyslipidemia 01/28/2023   Chronic diastolic CHF (congestive heart failure) (HCC) 01/28/2023   Gout 01/28/2023   Anxiety and depression 01/28/2023   Right arm weakness 01/28/2023   Acute CVA (cerebrovascular accident) (HCC) 01/28/2023   Bilateral carotid artery stenosis 01/28/2023   Hx of total hip arthroplasty, 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 01/18/2023   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   Slurred speech 09/23/2021   STEMI involving oth coronary artery of inferior wall (  HCC) 01/11/2021   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) 01/11/2021   Controlled type 2 diabetes mellitus with hyperglycemia, without long-term current use of insulin  (HCC) 01/11/2021   Pure hypercholesterolemia 01/11/2021   Hypercalcemia 01/11/2021   DDD (degenerative disc disease),  lumbar 12/01/2013   Lumbar radiculitis 12/01/2013   Atrial fibrillation, transient (HCC) 11/10/2013   Sinoatrial node dysfunction (HCC) 11/10/2013   Primary hypertension 11/10/2013   Poorly-controlled hypertension 11/10/2013   PCP:  Valora Agent, MD Pharmacy:   [id number] [place], [place] - [address] AT [facility] [address] [place] [address] Phone: [phone] Fax: [phone]  [facility] [id number] [place], [place] - [address] [place] [address] Phone: [phone] Fax: [phone]     Social Drivers of Health (SDOH) Social History: SDOH Screenings   Food Insecurity: Patient Unable To Answer (01/25/2024)  Housing: Unknown (01/25/2024)  Transportation Needs: Patient Unable To Answer (01/25/2024)  Utilities: Patient Unable To Answer (01/25/2024)  Depression (PHQ2-9): Low Risk  (06/22/2023)  Financial Resource Strain: Patient Declined (11/05/2022)   Received from [facility]  Social Connections: Patient Unable To Answer (01/25/2024)  Tobacco Use: Medium Risk (03/13/2024)   SDOH Interventions:     Readmission Risk Interventions    03/14/2024    3:19 PM 03/01/2023   10:20 AM  Readmission Risk Prevention Plan  Transportation Screening Complete Complete  PCP or Specialist Appt within 3-5 Days Complete Complete  HRI or Home Care Consult  Complete  Social Work Consult for Recovery Care Planning/Counseling  Complete  Palliative Care Screening Not Applicable Not Applicable  Medication Review Oceanographer) Complete Complete

## 2024-03-17 NOTE — Plan of Care (Signed)

## 2024-03-17 NOTE — Progress Notes (Signed)
  Progress Note    03/17/2024 8:49 AM 4 Days Post-Op  Subjective:  Brooke Davis is an 83 yo female now POD #3 from:   PROCEDURE:  Ultrasound guidance for vascular access right femoral artery  Placement of a 9 mm proximal, 7 mm distal, 4 cm long exact stent with the use of the NAV-6 embolic protection device in the right carotid artery   At this time the patient seems to be at her baseline.  There is no obvious deficits noted.  Patient remains calm.  Her MRI was reviewed by Dr. Selinda Davis yesterday noting a tiny punctate right hemispheric area of ischemia.  No other complaints overnight vitals are stable.   Vitals:   03/17/24 0023 03/17/24 0351  BP: (!) 155/69 (!) 166/61  Pulse: 78 68  Resp: 16 15  Temp: 98.8 F (37.1 C) 98 F (36.7 C)  SpO2: 95% 95%   Physical Exam: Cardiac:  RRR, normal S1 and S2.  No murmurs appreciated. Lungs: Clear throughout on auscultation.  Normal labored breathing.  No rales rhonchi or wheezing. Incisions: Right groin access without hematoma seroma or infection no. Extremities: All extremities warm to touch and with palpable pulses. Abdomen: Positive bowel sounds throughout, soft, nontender nondistended. Neurologic: Alert and oriented x 3, answers all questions and follows commands appropriately.  No focal deficits.  All extremities 5 out of 5 strength with good sensation.  CBC    Component Value Date/Time   WBC 8.1 03/17/2024 0429   RBC 2.82 (L) 03/17/2024 0429   HGB 9.7 (L) 03/17/2024 0429   HCT 29.6 (L) 03/17/2024 0429   PLT 171 03/17/2024 0429   MCV 105.0 (H) 03/17/2024 0429   MCH 34.4 (H) 03/17/2024 0429   MCHC 32.8 03/17/2024 0429   RDW 13.9 03/17/2024 0429   LYMPHSABS 0.5 (L) 03/14/2024 1524   MONOABS 0.3 03/14/2024 1524   EOSABS 0.0 03/14/2024 1524   BASOSABS 0.0 03/14/2024 1524    BMET    Component Value Date/Time   NA 141 03/17/2024 0429   K 4.1 03/17/2024 0429   CL 103 03/17/2024 0429   CO2 27 03/17/2024 0429   GLUCOSE  118 (H) 03/17/2024 0429   BUN 21 03/17/2024 0429   CREATININE 0.98 03/17/2024 0429   CALCIUM  9.3 03/17/2024 0429   GFRNONAA 58 (L) 03/17/2024 0429    INR    Component Value Date/Time   INR 1.1 03/14/2024 1611     Intake/Output Summary (Last 24 hours) at 03/17/2024 0849 Last data filed at 03/16/2024 0900 Gross per 24 hour  Intake 0 ml  Output --  Net 0 ml     Assessment/Plan:  83 y.o. female is s/p SEE ABOVE 4 Days Post-Op   PLAN Awake and alert this morning back to her baseline. MRI with noted punctate infarcts.  I do not believe this explains her presentation postprocedure but given her overall situation Dr. Selinda Gu MD has recommended Eliquis  5 mg BID and Plavix  75 mg daily. Continue physical therapy and Occupational Therapy. Per vascular surgery okay to discharge to home today if medically stable. Patient okay to be on Eliquis  5 mg daily and Plavix  75 mg daily at home. Patient to follow-up with vein and vascular services in 3 weeks postop for check.     DVT prophylaxis: Eliquis  5 mg twice daily, Plavix  75 mg daily.   [name] [facility] 03/17/2024 8:49 AM

## 2024-03-17 NOTE — Discharge Summary (Signed)
 Physician Discharge Summary   Patient: Brooke Davis MRN: [id number] DOB: 11-27-1940  Admit date:     03/13/2024  Discharge date: 03/17/24  Discharge Physician: AIDA CHO   PCP: Valora Agent, MD   Recommendations at discharge:    Follow up with PCP in 1 week Follow-up with Dr. Marea, vascular surgeon, in 3 weeks  Discharge Diagnoses: Principal Problem:   Carotid stenosis, right  Resolved Problems:   * No resolved hospital problems. *  Hospital Course:  Brooke Davis is a 83 y.o. 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   Course complicated by seizure like activity and Acute Metabolic Encephalopathy (likely postictal delirium) and multiple scattered acute small punctate right hemispheric ischemic strokes.        Significant Hospital Events: Including procedures, antibiotic start and stop dates in addition to other pertinent events   08/25: Admitted to ICU stable post carotid endarterectomy with stenting 08/26: Pt developed seizure-like symptoms and became postictal.  PCCM team           consulted to assist with management,  Stat neurology consulted ordered.  CT Head       revealed no acute intracranial abnormality, but showed extensive chronic small        vessel ischemic disease. Stat EEG without seizures, suggestive of moderate to diffuse encephalopathy. 08/27: MRI overnight showed multiple scattered punctate acute ischemic infarcts of the right cerebral hemisphere. Mentation much improved, off Precedex .   Hemodynamically stable, consult PT/OT.  Deescalate aspiration coverage to Ceftriaxone .  Assessment and Plan:   Seizure-like activity: She will be discharged on oral Keppra  500 mg twice daily.  Follow-up with neurologist as an outpatient.     Acute metabolic encephalopathy: Resolved     Acute stroke, recent right carotid stent placement on 03/13/2024 for symptomatic high grade right artery stenosis: Continue Plavix      Paroxysmal atrial fibrillation: Continue Eliquis      AKI: Resolved Hypokalemia: Resolved Bradycardia: Resolved     Comorbidities include COPD, hyperlipidemia, CAD, hypertension, chronic anemia, type II DM     Her condition has improved and she is deemed stable for discharge to home today.  Discharge plan was discussed with 2 daughters at the bedside [place] and [place]).             Consultants: Vascular surgeon, intensivist, neurologist Procedures performed: Right carotid artery stent placement on 03/13/2024   Disposition: Home health Diet recommendation:  Discharge Diet Orders (From admission, onward)     Start     Ordered   03/17/24 0000  Diet - low sodium heart healthy        03/17/24 1411           Cardiac diet DISCHARGE MEDICATION: Allergies as of 03/17/2024       Reactions   Celebrex [celecoxib] Diarrhea   Penicillins Itching   IgE = 154 (WNL) on 01/05/2023   Relafen [nabumetone]    Palpitation   Sulfa Antibiotics    Chlorhexidine  Rash   Lovastatin Diarrhea   Tape Rash        Medication List  STOP taking these medications    aspirin  81 MG chewable tablet   ciprofloxacin  500 MG tablet Commonly known as: CIPRO    traMADol  50 MG tablet Commonly known as: ULTRAM        TAKE these medications    acetaminophen  325 MG tablet Commonly known as: TYLENOL  Take 2 tablets (650 mg total) by mouth every 6 (six) hours as needed for mild pain (or Fever >/= 101).   allopurinol  300 MG tablet Commonly known as: ZYLOPRIM  Take 1  tablet (300 mg total) by mouth daily.   ALPRAZolam  0.25 MG tablet Commonly known as: XANAX  Take 1 tablet (0.25 mg total) by mouth 2 (two) times daily as needed for anxiety.   apixaban  5 MG Tabs tablet Commonly known as: ELIQUIS  Take 1 tablet (5 mg total) by mouth 2 (two) times daily.   atorvastatin  80 MG tablet Commonly known as: LIPITOR  Take 1 tablet (80 mg total) by mouth daily.   citalopram  20 MG tablet Commonly known as: CELEXA  Take 1 tablet (20 mg total) by mouth daily.   clopidogrel  75 MG tablet Commonly known as: PLAVIX  TAKE 1 TABLET(75 MG) BY MOUTH DAILY   levETIRAcetam  500 MG tablet Commonly known as: KEPPRA  Take 1 tablet (500 mg total) by mouth 2 (two) times daily.   losartan  50 MG tablet Commonly known as: COZAAR  Take 1 tablet (50 mg total) by mouth daily for 25 days.   melatonin 5 MG Tabs Take 1 tablet (5 mg total) by mouth at bedtime.   metoprolol  tartrate 25 MG tablet Commonly known as: LOPRESSOR  Take 0.5 tablets (12.5 mg total) by mouth 2 (two) times daily. What changed: how much to take   nystatin  powder Commonly known as: MYCOSTATIN /NYSTOP  Apply 1 Application topically 3 (three) times daily.   potassium chloride  10 MEQ tablet Commonly known as: KLOR-CON  Take 1 tablet (10 mEq total) by mouth daily.   torsemide  20 MG tablet Commonly known as: DEMADEX  Take 1 tablet (20 mg total) by mouth daily.        Follow-up Information     Dew, Selinda RAMAN, MD. Schedule an appointment as soon as possible for a visit in 3 week(s).   Specialties: Vascular Surgery, Radiology, Interventional Cardiology Contact information: [address] [place] [address] [phone]                Discharge Exam: Fredricka Weights   03/13/24 0957 03/13/24 1303  Weight: 84.6 kg 84.4 kg   GEN: NAD SKIN: Warm and dry EYES: No pallor or icterus ENT: MMM CV: RRR PULM: CTA B ABD: soft, obese, NT, +BS CNS: AAO x 3, non focal EXT: No edema or  tenderness   Condition at discharge: good  The results of significant diagnostics from this hospitalization (including imaging, microbiology, ancillary and laboratory) are listed below for reference.   Imaging Studies: ECHOCARDIOGRAM COMPLETE Result Date: 03/15/2024    ECHOCARDIOGRAM REPORT   Patient Name:   Brooke Davis Date of Exam: 03/15/2024 Medical Rec #:  [id number]        Height:       62.0 in Accession #:    [id number]       Weight:       186.1 lb Date of Birth:  [birth year]        BSA:          1.854 m Patient Age:    83 years         BP:  126/57 mmHg Patient Gender: F                HR:           52 bpm. Exam Location:  [facility] Procedure: 2D Echo, Cardiac Doppler and Color Doppler (Both Spectral and Color            Flow Doppler were utilized during procedure). Indications:     Stroke I63.9  History:         Patient has prior history of Echocardiogram examinations, most                  recent 01/26/2024. Stroke; Arrythmias:Atrial Fibrillation.  Sonographer:     Ashley McNeely-Sloane Referring Phys:  [id number] INGE JONETTA LECHER Diagnosing Phys: Timothy Gollan MD IMPRESSIONS  1. Left ventricular ejection fraction, by estimation, is 60 to 65%. The left ventricle has normal function. The left ventricle has no regional wall motion abnormalities. Left ventricular diastolic parameters are indeterminate.  2. Right ventricular systolic function is normal. The right ventricular size is normal.  3. Left atrial size was moderately dilated.  4. The mitral valve is normal in structure. Mild mitral valve regurgitation. No evidence of mitral stenosis.  5. The aortic valve is calcified. Aortic valve regurgitation is not visualized. Mild to moderate aortic valve stenosis. Aortic valve area, by VTI measures 1.10 cm. Aortic valve mean gradient measures 16.5 mmHg. Aortic valve Vmax measures 2.34 m/s.  6. The inferior vena cava is normal in size with greater than 50% respiratory variability, suggesting right atrial  pressure of 3 mmHg. FINDINGS  Left Ventricle: Left ventricular ejection fraction, by estimation, is 60 to 65%. The left ventricle has normal function. The left ventricle has no regional wall motion abnormalities. Strain was performed and the global longitudinal strain is indeterminate. The left ventricular internal cavity size was normal in size. There is borderline left ventricular hypertrophy. Left ventricular diastolic parameters are indeterminate. Right Ventricle: The right ventricular size is normal. No increase in right ventricular wall thickness. Right ventricular systolic function is normal. Left Atrium: Left atrial size was moderately dilated. Right Atrium: Right atrial size was normal in size. Pericardium: There is no evidence of pericardial effusion. Mitral Valve: The mitral valve is normal in structure. Mild mitral annular calcification. Mild mitral valve regurgitation. No evidence of mitral valve stenosis. MV peak gradient, 6.5 mmHg. The mean mitral valve gradient is 3.0 mmHg. Tricuspid Valve: The tricuspid valve is normal in structure. Tricuspid valve regurgitation is mild . No evidence of tricuspid stenosis. Aortic Valve: The aortic valve is calcified. Aortic valve regurgitation is not visualized. Mild to moderate aortic stenosis is present. Aortic valve mean gradient measures 16.5 mmHg. Aortic valve peak gradient measures 21.9 mmHg. Aortic valve area, by VTI measures 1.10 cm. Pulmonic Valve: The pulmonic valve was normal in structure. Pulmonic valve regurgitation is not visualized. No evidence of pulmonic stenosis. Aorta: The aortic root is normal in size and structure. Venous: The inferior vena cava is normal in size with greater than 50% respiratory variability, suggesting right atrial pressure of 3 mmHg. IAS/Shunts: No atrial level shunt detected by color flow Doppler. Additional Comments: 3D was performed not requiring image post processing on an independent workstation and was indeterminate.   LEFT VENTRICLE PLAX 2D LVIDd:         4.65 cm     Diastology LVIDs:         2.15 cm     LV e' medial:    7.07 cm/s  LV PW:         1.10 cm     LV E/e' medial:  16.5 LV IVS:        1.00 cm     LV e' lateral:   8.59 cm/s LVOT diam:     1.90 cm     LV E/e' lateral: 13.6 LV SV:         62 LV SV Index:   33 LVOT Area:     2.84 cm  LV Volumes (MOD) LV vol d, MOD A2C: 69.3 ml LV vol d, MOD A4C: 45.5 ml LV vol s, MOD A2C: 20.7 ml LV vol s, MOD A4C: 16.1 ml LV SV MOD A2C:     48.6 ml LV SV MOD A4C:     45.5 ml LV SV MOD BP:      37.8 ml RIGHT VENTRICLE RV Basal diam:  4.70 cm RV Mid diam:    2.90 cm RV S prime:     7.51 cm/s TAPSE (M-mode): 1.9 cm LEFT ATRIUM              Index        RIGHT ATRIUM           Index LA diam:        4.15 cm  2.24 cm/m   RA Area:     27.90 cm LA Vol (A2C):   126.0 ml 67.96 ml/m  RA Volume:   103.00 ml 55.56 ml/m LA Vol (A4C):   71.4 ml  38.51 ml/m LA Biplane Vol: 101.0 ml 54.48 ml/m  AORTIC VALVE                     PULMONIC VALVE AV Area (Vmax):    1.19 cm      PV Vmax:        1.05 m/s AV Area (Vmean):   1.13 cm      PV Vmean:       72.200 cm/s AV Area (VTI):     1.10 cm      PV VTI:         0.255 m AV Vmax:           233.75 cm/s   PV Peak grad:   4.4 mmHg AV Vmean:          168.500 cm/s  PV Mean grad:   2.0 mmHg AV VTI:            0.564 m       RVOT Peak grad: 3 mmHg AV Peak Grad:      21.9 mmHg AV Mean Grad:      16.5 mmHg LVOT Vmax:         97.90 cm/s LVOT Vmean:        67.400 cm/s LVOT VTI:          0.219 m LVOT/AV VTI ratio: 0.39  AORTA Ao Root diam: 3.20 cm Ao Asc diam:  3.10 cm MITRAL VALVE                TRICUSPID VALVE MV Area (PHT): 2.88 cm     TR Peak grad:   23.2 mmHg MV Area VTI:   1.80 cm     TR Mean grad:   19.0 mmHg MV Peak grad:  6.5 mmHg     TR Vmax:        241.00 cm/s MV Mean grad:  3.0 mmHg     TR Vmean:  214.0 cm/s MV Vmax:       1.27 m/s MV Vmean:      88.5 cm/s    SHUNTS MV Decel Time: 263 msec     Systemic VTI:  0.22 m MV E velocity: 117.00 cm/s  Systemic  Diam: 1.90 cm                             Pulmonic VTI:  0.224 m Evalene Lunger MD Electronically signed by Evalene Lunger MD Signature Date/Time: 03/15/2024/5:17:51 PM    Final    MR BRAIN WO CONTRAST Result Date: 03/15/2024 CLINICAL DATA:  Initial evaluation for acute neuro deficit, stroke suspected. EXAM: MRI HEAD WITHOUT CONTRAST TECHNIQUE: Multiplanar, multiecho pulse sequences of the brain and surrounding structures were obtained without intravenous contrast. COMPARISON:  Comparison made with prior CT from earlier the same day. FINDINGS: Brain: Cerebral volume within normal limits. Patchy T2/FLAIR hyperintensity involving the periventricular and deep white matter both cerebral hemispheres as well as the pons, consistent with chronic small vessel ischemic disease, moderate to advanced in nature. Few scattered superimposed remote lacunar infarcts present about the hemispheric cerebral white matter. Small remote cortical infarct involving the left parietal lobe noted. Scattered punctate foci of restricted diffusion are seen involving the right cerebral hemisphere, consistent with small acute ischemic infarcts (series 5, images 41-16). No associated hemorrhage or mass effect. No other acute or subacute ischemic changes. Gray-white matter differentiation otherwise maintained. No acute intracranial hemorrhage. Few scattered chronic micro hemorrhages noted, likely hypertensive/small vessel related. No mass lesion, midline shift or mass effect. No hydrocephalus or extra-axial fluid collection. Pituitary gland within normal limits. No other intrinsic temporal lobe abnormality. Vascular: Right vertebral artery markedly hypoplastic and not well seen. Major intracranial vascular flow voids are otherwise maintained. Skull and upper cervical spine: Craniocervical junction within normal limits. Bone marrow signal intensity overall within normal limits. No scalp soft tissue abnormality. Sinuses/Orbits: Prior bilateral  ocular lens replacement. Paranasal sinuses are largely clear. No significant mastoid effusion. Other: None. IMPRESSION: 1. Multiple scattered punctate acute ischemic nonhemorrhagic infarcts involving the right cerebral hemisphere. 2. Underlying moderate to advanced chronic microvascular ischemic disease with a few scattered remote infarcts involving the hemispheric cerebral white matter and left parietal lobe. Electronically Signed   By: Morene Hoard M.D.   On: 03/15/2024 00:00   DG Chest Port 1 View Result Date: 03/14/2024 CLINICAL DATA:  Acute respiratory failure. EXAM: PORTABLE CHEST 1 VIEW COMPARISON:  March 18, 2023. FINDINGS: Stable cardiomediastinal silhouette. No acute pulmonary disease is noted. Bony thorax is unremarkable. IMPRESSION: No active disease. Electronically Signed   By: Lynwood Landy Raddle M.D.   On: 03/14/2024 15:24   EEG adult Result Date: 03/14/2024 Shelton Arlin KIDD, MD     03/14/2024  1:15 PM Patient Name: Brooke Davis MRN: [id number] Epilepsy Attending: Arlin KIDD Shelton Referring Physician/Provider: Maranda Lonell MATSU, NP Date: 03/14/2024 Duration: 28.48 mins Patient history: 83 year old female with seizure-like activity.  EEG to evaluate for seizure. Level of alertness: Awake/ lethargic AEDs during EEG study: versed  Technical aspects: This EEG study was done with scalp electrodes positioned according to the 10-20 International system of electrode placement. Electrical activity was reviewed with band pass filter of 1-70Hz , sensitivity of 7 uV/mm, display speed of 21mm/sec with a 60Hz  notched filter applied as appropriate. EEG data were recorded continuously and digitally stored.  Video monitoring was available and reviewed as appropriate. Description: EEG showed continuous generalized 2-3 Hz  delta slowing admixed with 13 to 15 Hz beta activity. Hyperventilation and photic stimulation were not performed.   Of note, study is technically difficult due to significant myogenic artifact  ABNORMALITY - Continuous slow, generalized IMPRESSION: This technically difficult study is suggestive of moderate to severe diffuse encephalopathy. No seizures or epileptiform discharges were seen throughout the recording. Priyanka MALVA Krebs   CT HEAD WO CONTRAST ( ) Result Date: 03/14/2024 EXAM: CT HEAD WITHOUT CONTRAST 03/14/2024 10:51:14 AM TECHNIQUE: CT of the head was performed without the administration of intravenous contrast. Automated exposure control, iterative reconstruction, and/or weight based adjustment of the mA/kV was utilized to reduce the radiation dose to as low as reasonably achievable. COMPARISON: MRI head 01/25/2024. CT head 01/24/2024. CLINICAL HISTORY: Neuro deficit, acute, stroke suspected. FINDINGS: BRAIN AND VENTRICLES: No acute hemorrhage. Gray-white differentiation is preserved. No hydrocephalus. No extra-axial collection. No mass effect or midline shift. Confluent hypodensities in the cerebral white matter bilaterally, similar to the prior CT and nonspecific but compatible with extensive chronic small vessel ischemic disease. Mild cerebral atrophy. ORBITS: Bilateral cataract extraction. SINUSES: No acute abnormality. SOFT TISSUES AND SKULL: No acute soft tissue abnormality. No skull fracture. Calcified atherosclerosis at the skull base. No hyperdense vessel. IMPRESSION: 1. No acute intracranial abnormality. 2. Extensive chronic small vessel ischemic disease. Electronically signed by: Dasie Hamburg MD 03/14/2024 11:09 AM EDT RP Workstation: [id number]   PERIPHERAL VASCULAR CATHETERIZATION Result Date: 03/13/2024 See surgical note for result.   Microbiology: Results for orders placed or performed during the hospital encounter of 03/13/24  MRSA Next Gen by PCR, Nasal     Status: None   Collection Time: 03/13/24  1:07 PM   Specimen: Nasal Mucosa; Nasal Swab  Result Value Ref Range Status   MRSA by PCR Next Gen NOT DETECTED NOT DETECTED Final    Comment: (NOTE) The GeneXpert  MRSA Assay (FDA approved for NASAL specimens only), is one component of a comprehensive MRSA colonization surveillance program. It is not intended to diagnose MRSA infection nor to guide or monitor treatment for MRSA infections. Test performance is not FDA approved in patients less than 46 years old. Performed at [facility], [address] [place], [place] [address]     Labs: CBC: Recent Labs  Lab 03/11/24 1731 03/14/24 0511 03/14/24 1524 03/15/24 0424 03/16/24 0320 03/17/24 0429  WBC 6.2 5.5 11.1* 8.0 9.1 8.1  NEUTROABS 4.0  --  10.3*  --   --   --   HGB 11.7* 10.1* 11.7* 10.9* 10.0* 9.7*  HCT 35.0* 30.2* 34.7* 33.4* 30.3* 29.6*  MCV 105.1* 103.8* 105.2* 104.0* 104.5* 105.0*  PLT 221 190 214 178 177 171   Basic Metabolic Panel: Recent Labs  Lab 03/14/24 0511 03/14/24 1524 03/15/24 0424 03/16/24 0320 03/17/24 0429  NA 139 136 140 138 141  K 3.4* 3.9 4.0 3.6 4.1  CL 99 97* 100 102 103  CO2 27 25 29 26 27   GLUCOSE 114* 170* 158* 140* 118*  BUN 26* 22 23 27* 21  CREATININE 1.30* 1.29* 1.28* 1.13* 0.98  CALCIUM  9.1 9.3 9.4 9.1 9.3  MG  --  1.6* 3.1* 2.6*  --   PHOS  --  4.0 4.1 2.9 2.9   Liver Function Tests: Recent Labs  Lab 03/14/24 1524 03/16/24 0320 03/17/24 0429  AST 38  --   --   ALT 10  --   --   ALKPHOS 107  --   --   BILITOT 1.3*  --   --  PROT 7.3  --   --   ALBUMIN 3.6 3.1* 3.0*   CBG: Recent Labs  Lab 03/15/24 0717 03/15/24 1124 03/15/24 1633 03/15/24 2044 03/15/24 2332  GLUCAP 130* 109* 127* 142* 143*    Discharge time spent: greater than 30 minutes.  Signed: AIDA CHO, MD [facility] 03/17/2024

## 2024-04-11 ENCOUNTER — Other Ambulatory Visit (INDEPENDENT_AMBULATORY_CARE_PROVIDER_SITE_OTHER): Payer: Self-pay | Admitting: Vascular Surgery

## 2024-04-11 DIAGNOSIS — I6523 Occlusion and stenosis of bilateral carotid arteries: Secondary | ICD-10-CM

## 2024-04-13 ENCOUNTER — Ambulatory Visit (INDEPENDENT_AMBULATORY_CARE_PROVIDER_SITE_OTHER): Admitting: Nurse Practitioner

## 2024-04-13 ENCOUNTER — Encounter (INDEPENDENT_AMBULATORY_CARE_PROVIDER_SITE_OTHER): Payer: Self-pay | Admitting: Nurse Practitioner

## 2024-04-13 ENCOUNTER — Ambulatory Visit (INDEPENDENT_AMBULATORY_CARE_PROVIDER_SITE_OTHER)

## 2024-04-13 VITALS — BP 134/79 | HR 64 | Ht 62.0 in

## 2024-04-13 DIAGNOSIS — E1165 Type 2 diabetes mellitus with hyperglycemia: Secondary | ICD-10-CM

## 2024-04-13 DIAGNOSIS — I6523 Occlusion and stenosis of bilateral carotid arteries: Secondary | ICD-10-CM | POA: Diagnosis not present

## 2024-04-13 DIAGNOSIS — I10 Essential (primary) hypertension: Secondary | ICD-10-CM

## 2024-04-16 ENCOUNTER — Other Ambulatory Visit (INDEPENDENT_AMBULATORY_CARE_PROVIDER_SITE_OTHER): Payer: Self-pay | Admitting: Vascular Surgery

## 2024-05-01 NOTE — Progress Notes (Signed)
 Subjective:    Patient ID: Brooke Davis, female    DOB: 01-Mar-1941, 83 y.o.   MRN: [id number] Chief Complaint  Patient presents with   Follow-up    Wanting to know if Keppra  needed long term as causes fatigue and concerns on it affecting her heart rate also wanting to know what a good baseline Bp is for her to wtach for at home    The patient is seen for follow up evaluation of carotid stenosis status post Right carotid stent on 03/13/2024.  There were no post operative problems or complications related to the surgery.  The patient denies neck or incisional pain.  The patient denies interval amaurosis fugax. There is no recent history of TIA symptoms or focal motor deficits.  Immediately following her stent placement she had what was felt to be a seizure but there is suspicion this is likely a manifestation of a stroke.  She has since been placed on Keppra .  The patient denies headache.  The patient is taking enteric-coated aspirin  81 mg daily.  No recent shortening of the patient's walking distance or new symptoms consistent with claudication.  No history of rest pain symptoms. No new ulcers or wounds of the lower extremities have occurred.  There is no history of DVT, PE or superficial thrombophlebitis. No recent episodes of angina or shortness of breath documented.      Review of Systems  All other systems reviewed and are negative.      Objective:   Physical Exam Vitals reviewed.  HENT:     Head: Normocephalic.  Cardiovascular:     Rate and Rhythm: Normal rate.     Pulses: Normal pulses.  Pulmonary:     Effort: Pulmonary effort is normal.  Skin:    General: Skin is warm and dry.  Neurological:     General: No focal deficit present.     Mental Status: She is alert and oriented to person, place, and time.  Psychiatric:        Mood and Affect: Mood normal.        Behavior: Behavior normal.        Thought Content: Thought content normal.        Judgment:  Judgment normal.     BP 134/79   Pulse 64   Ht 5' 2 (1.575 m)   BMI 34.03 kg/m   Past Medical History:  Diagnosis Date   Anxiety    a.) on BZO (alprazolam ) PRN   Arthritis    fingers, right hip   Atrial fibrillation (HCC) 11/10/2013   a.) CHA2DS2VASc = 5 (age x2, sex, HTN, T2DM);  b.) rate/rhythm maintained on oral metoprolol  tartrate; chronically anticoagulated with apixaban    Bronchitis    Carotid artery stenosis    R   Cerebrovascular small vessel disease (chronic)    CHF (congestive heart failure) (HCC)    COPD (chronic obstructive pulmonary disease) (HCC)    Coronary artery disease involving native coronary artery of native heart 01/11/2021   a.) LHC 01/11/2021: 40% oLM, 50% mLCx, 30% oLCx, 30% pLAD, 30% mLAD, 99% pRCA (2.75 x 30 mm Resolute Onyx DES), 75% p-mRCA, 60% dRCA   DDD (degenerative disc disease), lumbar 12/01/2013   Diastolic dysfunction 01/12/2021   a.) TTE 01/12/2021 (s/p inf STEMI): EF 55-60%, mild-mod LAE, mild RAE, mod RVE, mild AR/MR, mod TR, G1DD   Diet-controlled type 2 diabetes mellitus (HCC)    Full dentures    Gout    History of bilateral  cataract extraction 2021   Hyperlipidemia    Hypertension, essential    Long term current use of anticoagulant    a.) apixaban    PONV (postoperative nausea and vomiting)    Renal disorder    CKD, unknown stage   Sinoatrial node dysfunction (HCC) 11/10/2013   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) 01/11/2021   a.) LHC/PCI 01/11/2021 --> culprit lesion 99% pRCA --> 2.75 x 30 mm Resolute Onyx DES x 1   Stroke [facility])    July 2025, no deficits   Varicose veins of legs    Wears hearing aid in both ears     Social History   Socioeconomic History   Marital status: Widowed    Spouse name: Not on file   Number of children: 4   Years of education: Not on file   Highest education level: Not on file  Occupational History   Not on file  Tobacco Use   Smoking status: Former    Smokeless tobacco: Never  Vaping Use   Vaping status: Never Used  Substance and Sexual Activity   Alcohol use: Not Currently   Drug use: Never   Sexual activity: Not on file  Other Topics Concern   Not on file  Social History Narrative   ** Merged History Encounter **       Social Drivers of Health   Financial Resource Strain: Patient Declined (11/05/2022)   Received from [facility]   Overall Financial Resource Strain (CARDIA)    Difficulty of Paying Living Expenses: Patient declined  Food Insecurity: Patient Unable To Answer (01/25/2024)   Hunger Vital Sign    Worried About Running Out of Food in the Last Year: Patient unable to answer    Ran Out of Food in the Last Year: Patient unable to answer  Transportation Needs: Patient Unable To Answer (01/25/2024)   PRAPARE - Transportation    Lack of Transportation (Medical): Patient unable to answer    Lack of Transportation (Non-Medical): Patient unable to answer  Physical Activity: Not on file  Stress: Not on file  Social Connections: Patient Unable To Answer (01/25/2024)   Social Connection and Isolation Panel    Frequency of Communication with Friends and Family: Patient unable to answer    Frequency of Social Gatherings with Friends and Family: Patient unable to answer    Attends Religious Services: Patient unable to answer    Active Member of Clubs or Organizations: Patient unable to answer    Attends Banker Meetings: Patient unable to answer    Marital Status: Patient unable to answer  Intimate Partner Violence: Patient Unable To Answer (01/25/2024)   Humiliation, Afraid, Rape, and Kick questionnaire    Fear of Current or Ex-Partner: Patient unable to answer    Emotionally Abused: Patient unable to answer    Physically Abused: Patient unable to answer    Sexually Abused: Patient unable to answer    Past Surgical History:  Procedure Laterality Date   BREAST BIOPSY Left 2011   NEG   CARDIAC  CATHETERIZATION     over 20 yrs ago.  All OK.   CAROTID PTA/STENT INTERVENTION Left 02/25/2023   Procedure: CAROTID PTA/STENT INTERVENTION;  Surgeon: Marea Selinda RAMAN, MD;  Location: [facility];  Service: Cardiovascular;  Laterality: Left;   CAROTID PTA/STENT INTERVENTION Right 03/13/2024   Procedure: CAROTID PTA/STENT INTERVENTION;  Surgeon: Marea Selinda RAMAN, MD;  Location: [facility];  Service: Cardiovascular;  Laterality: Right;   CATARACT EXTRACTION W/PHACO Left 03/27/2020   Procedure: CATARACT EXTRACTION PHACO AND INTRAOCULAR LENS PLACEMENT (IOC) LEFT 7.49  00:56.4  13.3%;  Surgeon: Mittie Gaskin, MD;  Location: [facility];  Service: Ophthalmology;  Laterality: Left;   CATARACT EXTRACTION W/PHACO Right 04/24/2020   Procedure: CATARACT EXTRACTION PHACO AND INTRAOCULAR LENS PLACEMENT (IOC) RIGHT;  Surgeon: Mittie Gaskin, MD;  Location: [facility];  Service: Ophthalmology;  Laterality: Right;  8.34 0:50.2 16.6%   CORONARY/GRAFT ACUTE MI REVASCULARIZATION N/A 01/11/2021   Procedure: Coronary/Graft Acute MI Revascularization;  Surgeon: Ammon Blunt, MD;  Location: [facility];  Service: Cardiovascular;  Laterality: N/A;   INCISION AND DRAINAGE Right 03/21/2023   Procedure: RIGHT HIP IRRIGATION AND DEBRIDEMENT OF INFECTED TOTAL JOINT, EXCHANGE OF HEAD AND POLY COMPONENTS WITH PLACEMENT OF ANTIBIOTIC BEADS;  Surgeon: Zafonte, Brian Thomas, MD;  Location: [facility];  Service: Orthopedics;  Laterality: Right;   LEFT HEART CATH AND CORONARY ANGIOGRAPHY N/A 01/11/2021   Procedure: LEFT HEART CATH AND CORONARY ANGIOGRAPHY;  Surgeon: Ammon Blunt, MD;  Location: [facility];  Service: Cardiovascular;  Laterality: N/A;   PARTIAL HYSTERECTOMY     TOE SURGERY Left    great toe; rod in place   TOTAL HIP ARTHROPLASTY Right 01/18/2023   Procedure: TOTAL HIP ARTHROPLASTY;  Surgeon: Mardee Lynwood SQUIBB, MD;  Location: [facility];  Service:  Orthopedics;  Laterality: Right;   TOTAL KNEE ARTHROPLASTY Right 2007    Family History  Problem Relation Age of Onset   Heart attack Mother 85       required open heart surgery. Onset age is unknown.   Stroke Father    Diabetes Father    Alcoholism Brother    Breast cancer Neg Hx     Allergies  Allergen Reactions   Celebrex [Celecoxib] Diarrhea   Penicillins Itching    IgE = 154 (WNL) on 01/05/2023   Relafen [Nabumetone]     Palpitation    Sulfa Antibiotics    Chlorhexidine  Rash   Lovastatin Diarrhea   Tape Rash       Latest Ref Rng & Units 03/17/2024    4:29 AM 03/16/2024    3:20 AM 03/15/2024    4:24 AM  CBC  WBC 4.0 - 10.5 K/uL 8.1  9.1  8.0   Hemoglobin 12.0 - 15.0 g/dL 9.7  89.9  89.0   Hematocrit 36.0 - 46.0 % 29.6  30.3  33.4   Platelets 150 - 400 K/uL 171  177  178       CMP     Component Value Date/Time   NA 141 03/17/2024 0429   K 4.1 03/17/2024 0429   CL 103 03/17/2024 0429   CO2 27 03/17/2024 0429   GLUCOSE 118 (H) 03/17/2024 0429   BUN 21 03/17/2024 0429   CREATININE 0.98 03/17/2024 0429   CALCIUM  9.3 03/17/2024 0429   PROT 7.3 03/14/2024 1524   ALBUMIN 3.0 (L) 03/17/2024 0429   AST 38 03/14/2024 1524   ALT 10 03/14/2024 1524   ALKPHOS 107 03/14/2024 1524   BILITOT 1.3 (H) 03/14/2024 1524   GFRNONAA 58 (L) 03/17/2024 0429     No results found.     Assessment & Plan:   1. Bilateral carotid artery stenosis (Primary) Recommend:  The patient is s/p successful right carotid stent  Duplex ultrasound  shows <40%  stenosis.  Continue dual antiplatelet therapy as prescribed for a minimum of 6 months Continue management of CAD, HTN  and Hyperlipidemia Healthy heart diet,  encouraged exercise at least 4 times per week  The patient's NIHSS score is as follows: 0 Mild: 1 - 5 Mild to Moderately Severe: 5 - 14 Severe: 15 - 24 Very Severe: >25  Follow up in 3 months with duplex ultrasound and physical exam based on the patient's carotid  surgery   Will defer Management to the Patient's Neurologist 2. Primary hypertension Continue antihypertensive medications as already ordered, these medications have been reviewed and there are no changes at this time.  3. Controlled type 2 diabetes mellitus with hyperglycemia, without long-term current use of insulin  (HCC) Continue hypoglycemic medications as already ordered, these medications have been reviewed and there are no changes at this time.  Hgb A1C to be monitored as already arranged by primary service   Current Outpatient Medications on File Prior to Visit  Medication Sig Dispense Refill   acetaminophen  (TYLENOL ) 325 MG tablet Take 2 tablets (650 mg total) by mouth every 6 (six) hours as needed for mild pain (or Fever >/= 101).     allopurinol  (ZYLOPRIM ) 300 MG tablet Take 1 tablet (300 mg total) by mouth daily. 30 tablet 0   ALPRAZolam  (XANAX ) 0.25 MG tablet Take 1 tablet (0.25 mg total) by mouth 2 (two) times daily as needed for anxiety. 5 tablet 0   apixaban  (ELIQUIS ) 5 MG TABS tablet Take 1 tablet (5 mg total) by mouth 2 (two) times daily. 180 tablet 0   atorvastatin  (LIPITOR ) 80 MG tablet Take 1 tablet (80 mg total) by mouth daily. 30 tablet 11   citalopram  (CELEXA ) 20 MG tablet Take 1 tablet (20 mg total) by mouth daily. 30 tablet 0   levETIRAcetam  (KEPPRA ) 500 MG tablet Take 1 tablet (500 mg total) by mouth 2 (two) times daily. 60 tablet 0   losartan  (COZAAR ) 50 MG tablet Take 1 tablet (50 mg total) by mouth daily for 25 days. 30 tablet 0   metoprolol  tartrate (LOPRESSOR ) 25 MG tablet Take 0.5 tablets (12.5 mg total) by mouth 2 (two) times daily.     nystatin  (MYCOSTATIN /NYSTOP ) powder Apply 1 Application topically 3 (three) times daily. 15 g 0   potassium chloride  (KLOR-CON ) 10 MEQ tablet Take 1 tablet (10 mEq total) by mouth daily. 30 tablet 0   torsemide  (DEMADEX ) 20 MG tablet Take 1 tablet (20 mg total) by mouth daily. 30 tablet 0   traMADol  (ULTRAM ) 50 MG tablet  Take 100 mg by mouth.     melatonin 5 MG TABS Take 1 tablet (5 mg total) by mouth at bedtime. (Patient not taking: Reported on 04/13/2024) 30 tablet 0   No current facility-administered medications on file prior to visit.    There are no Patient Instructions on file for this visit. No follow-ups on file.   Maragret Vanacker E Jaclyn Carew, NP

## 2024-05-02 ENCOUNTER — Encounter (INDEPENDENT_AMBULATORY_CARE_PROVIDER_SITE_OTHER): Payer: Self-pay | Admitting: Nurse Practitioner

## 2024-07-17 ENCOUNTER — Encounter (INDEPENDENT_AMBULATORY_CARE_PROVIDER_SITE_OTHER)

## 2024-07-17 ENCOUNTER — Ambulatory Visit (INDEPENDENT_AMBULATORY_CARE_PROVIDER_SITE_OTHER): Admitting: Nurse Practitioner

## 2024-07-26 ENCOUNTER — Other Ambulatory Visit (INDEPENDENT_AMBULATORY_CARE_PROVIDER_SITE_OTHER): Payer: Self-pay | Admitting: Vascular Surgery

## 2024-07-26 DIAGNOSIS — I6523 Occlusion and stenosis of bilateral carotid arteries: Secondary | ICD-10-CM

## 2024-08-02 ENCOUNTER — Encounter (INDEPENDENT_AMBULATORY_CARE_PROVIDER_SITE_OTHER): Payer: Self-pay | Admitting: Nurse Practitioner

## 2024-08-02 ENCOUNTER — Ambulatory Visit (INDEPENDENT_AMBULATORY_CARE_PROVIDER_SITE_OTHER): Admitting: Nurse Practitioner

## 2024-08-02 ENCOUNTER — Ambulatory Visit (INDEPENDENT_AMBULATORY_CARE_PROVIDER_SITE_OTHER)

## 2024-08-02 VITALS — BP 130/74 | HR 64 | Resp 18 | Wt 194.6 lb

## 2024-08-02 DIAGNOSIS — I10 Essential (primary) hypertension: Secondary | ICD-10-CM | POA: Diagnosis not present

## 2024-08-02 DIAGNOSIS — E1165 Type 2 diabetes mellitus with hyperglycemia: Secondary | ICD-10-CM

## 2024-08-02 DIAGNOSIS — I6523 Occlusion and stenosis of bilateral carotid arteries: Secondary | ICD-10-CM | POA: Diagnosis not present

## 2024-08-06 ENCOUNTER — Encounter (INDEPENDENT_AMBULATORY_CARE_PROVIDER_SITE_OTHER): Payer: Self-pay | Admitting: Nurse Practitioner

## 2024-08-06 NOTE — Progress Notes (Signed)
 "  Subjective:    Patient ID: Brooke Davis, female    DOB: 1941-06-05, 84 y.o.   MRN: [id number] Chief Complaint  Patient presents with   Follow-up    3 month follow up + carotid    HPI  Discussed the use of AI scribe software for clinical note transcription with the patient, who gave verbal consent to proceed.  History of Present Illness Brooke Davis is an 84 year old female with bilateral carotid artery stents and chronic right vertebral artery hypoplasia who presents for vascular surgery follow-up.  She reports a persistent, severe cough for several weeks, which preceded a COVID-19 infection two weeks before Christmas. The cough is associated with episodes of choking and significant dyspnea. She experiences marked weakness and fatigue with minimal exertion, such as ambulating to the bathroom. She denies dizziness or lightheadedness.  Her weight has increased from 189 lbs in August to 194 lbs at today's visit, with home measurements typically stable around 185 lbs over the past year. She has poor appetite and consumes small amounts of food.  She has undergone bilateral carotid artery stenting. Recent imaging showed patent stents and 1-39% residual stenosis, which is unchanged from prior studies. Recent imaging showed that the subclavian arteries have normal flow. Imaging showed that the right vertebral artery is either chronically hypoplastic or occluded, and the left vertebral artery is dominant with antegrade flow. She denies dizziness or lightheadedness.    Results Radiology CT angiography head and neck arteries: Right vertebral artery chronically small or occluded, unchanged from prior imaging. Left vertebral artery dominant.  Diagnostic Carotid artery duplex ultrasound: Bilateral internal carotid artery stents patent with 1-39% residual stenosis, no significant interval change. Subclavian arteries with normal hemodynamic flow. Left vertebral artery with antegrade flow.  Right vertebral artery with high resistance flow, consistent with chronic hypoplasia or occlusion, unchanged from prior imaging.   Review of Systems  Neurological:  Positive for weakness.  All other systems reviewed and are negative.      Objective:   Physical Exam Vitals reviewed.  HENT:     Head: Normocephalic.  Cardiovascular:     Rate and Rhythm: Normal rate.     Pulses:          Radial pulses are 2+ on the right side and 2+ on the left side.  Pulmonary:     Effort: Pulmonary effort is normal.  Skin:    General: Skin is warm and dry.  Neurological:     Mental Status: She is alert and oriented to person, place, and time.  Psychiatric:        Mood and Affect: Mood normal.        Behavior: Behavior normal.        Thought Content: Thought content normal.        Judgment: Judgment normal.     Physical Exam MEASUREMENTS: Weight- 194.  BP 130/74 (BP Location: Left Arm, Patient Position: Sitting, Cuff Size: Normal)   Pulse 64   Resp 18   Wt 194 lb 9.6 oz (88.3 kg)   BMI 35.59 kg/m   Past Medical History:  Diagnosis Date   Anxiety    a.) on BZO (alprazolam ) PRN   Arthritis    fingers, right hip   Atrial fibrillation (HCC) 11/10/2013   a.) CHA2DS2VASc = 5 (age x2, sex, HTN, T2DM);  b.) rate/rhythm maintained on oral metoprolol  tartrate; chronically anticoagulated with apixaban    Bronchitis    Carotid artery stenosis    R  Cerebrovascular small vessel disease (chronic)    CHF (congestive heart failure) (HCC)    COPD (chronic obstructive pulmonary disease) (HCC)    Coronary artery disease involving native coronary artery of native heart 01/11/2021   a.) LHC 01/11/2021: 40% oLM, 50% mLCx, 30% oLCx, 30% pLAD, 30% mLAD, 99% pRCA (2.75 x 30 mm Resolute Onyx DES), 75% p-mRCA, 60% dRCA   DDD (degenerative disc disease), lumbar 12/01/2013   Diastolic dysfunction 01/12/2021   a.) TTE 01/12/2021 (s/p inf STEMI): EF 55-60%, mild-mod LAE, mild RAE, mod RVE, mild AR/MR, mod  TR, G1DD   Diet-controlled type 2 diabetes mellitus (HCC)    Full dentures    Gout    History of bilateral cataract extraction 2021   Hyperlipidemia    Hypertension, essential    Long term current use of anticoagulant    a.) apixaban    PONV (postoperative nausea and vomiting)    Renal disorder    CKD, unknown stage   Sinoatrial node dysfunction (HCC) 11/10/2013   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) 01/11/2021   a.) LHC/PCI 01/11/2021 --> culprit lesion 99% pRCA --> 2.75 x 30 mm Resolute Onyx DES x 1   Stroke [facility])    July 2025, no deficits   Varicose veins of legs    Wears hearing aid in both ears     Social History   Socioeconomic History   Marital status: Widowed    Spouse name: Not on file   Number of children: 4   Years of education: Not on file   Highest education level: Not on file  Occupational History   Not on file  Tobacco Use   Smoking status: Former   Smokeless tobacco: Never  Vaping Use   Vaping status: Never Used  Substance and Sexual Activity   Alcohol use: Not Currently   Drug use: Never   Sexual activity: Not on file  Other Topics Concern   Not on file  Social History Narrative   ** Merged History Encounter **       Social Drivers of Health   Tobacco Use: Medium Risk (08/06/2024)   Patient History    Smoking Tobacco Use: Former    Smokeless Tobacco Use: Never    Passive Exposure: Not on file  Financial Resource Strain: Patient Declined (11/05/2022)   Received from [facility]   Overall Financial Resource Strain (CARDIA)    Difficulty of Paying Living Expenses: Patient declined  Food Insecurity: Patient Unable To Answer (01/25/2024)   Epic    Worried About Programme Researcher, Broadcasting/film/video in the Last Year: Patient unable to answer    Ran Out of Food in the Last Year: Patient unable to answer  Transportation Needs: Patient Unable To Answer (01/25/2024)   Epic    Lack of Transportation (Medical): Patient unable  to answer    Lack of Transportation (Non-Medical): Patient unable to answer  Physical Activity: Not on file  Stress: Not on file  Social Connections: Patient Unable To Answer (01/25/2024)   Social Connection and Isolation Panel    Frequency of Communication with Friends and Family: Patient unable to answer    Frequency of Social Gatherings with Friends and Family: Patient unable to answer    Attends Religious Services: Patient unable to answer    Active Member of Clubs or Organizations: Patient unable to answer    Attends Banker Meetings: Patient unable to answer    Marital Status: Patient unable to answer  Intimate Partner Violence: Patient Unable To Answer (01/25/2024)   Epic    Fear of Current or Ex-Partner: Patient unable to answer    Emotionally Abused: Patient unable to answer    Physically Abused: Patient unable to answer    Sexually Abused: Patient unable to answer  Depression (PHQ2-9): Low Risk (06/22/2023)   Depression (PHQ2-9)    PHQ-2 Score: 0  Alcohol Screen: Not on file  Housing: Unknown (01/25/2024)   Epic    Unable to Pay for Housing in the Last Year: Patient unable to answer    Number of Times Moved in the Last Year: 0    Homeless in the Last Year: Patient unable to answer  Utilities: Patient Unable To Answer (01/25/2024)   Epic    Threatened with loss of utilities: Patient unable to answer  Health Literacy: Not on file    Past Surgical History:  Procedure Laterality Date   BREAST BIOPSY Left 2011   NEG   CARDIAC CATHETERIZATION     over 20 yrs ago.  All OK.   CAROTID PTA/STENT INTERVENTION Left 02/25/2023   Procedure: CAROTID PTA/STENT INTERVENTION;  Surgeon: Marea Selinda RAMAN, MD;  Location: [facility];  Service: Cardiovascular;  Laterality: Left;   CAROTID PTA/STENT INTERVENTION Right 03/13/2024   Procedure: CAROTID PTA/STENT INTERVENTION;  Surgeon: Marea Selinda RAMAN, MD;  Location: [facility];  Service: Cardiovascular;  Laterality: Right;    CATARACT EXTRACTION W/PHACO Left 03/27/2020   Procedure: CATARACT EXTRACTION PHACO AND INTRAOCULAR LENS PLACEMENT (IOC) LEFT 7.49  00:56.4  13.3%;  Surgeon: Mittie Gaskin, MD;  Location: [facility];  Service: Ophthalmology;  Laterality: Left;   CATARACT EXTRACTION W/PHACO Right 04/24/2020   Procedure: CATARACT EXTRACTION PHACO AND INTRAOCULAR LENS PLACEMENT (IOC) RIGHT;  Surgeon: Mittie Gaskin, MD;  Location: [facility];  Service: Ophthalmology;  Laterality: Right;  8.34 0:50.2 16.6%   CORONARY/GRAFT ACUTE MI REVASCULARIZATION N/A 01/11/2021   Procedure: Coronary/Graft Acute MI Revascularization;  Surgeon: Ammon Blunt, MD;  Location: [facility];  Service: Cardiovascular;  Laterality: N/A;   INCISION AND DRAINAGE Right 03/21/2023   Procedure: RIGHT HIP IRRIGATION AND DEBRIDEMENT OF INFECTED TOTAL JOINT, EXCHANGE OF HEAD AND POLY COMPONENTS WITH PLACEMENT OF ANTIBIOTIC BEADS;  Surgeon: Zafonte, Brian Thomas, MD;  Location: [facility];  Service: Orthopedics;  Laterality: Right;   LEFT HEART CATH AND CORONARY ANGIOGRAPHY N/A 01/11/2021   Procedure: LEFT HEART CATH AND CORONARY ANGIOGRAPHY;  Surgeon: Ammon Blunt, MD;  Location: [facility];  Service: Cardiovascular;  Laterality: N/A;   PARTIAL HYSTERECTOMY     TOE SURGERY Left    great toe; rod in place   TOTAL HIP ARTHROPLASTY Right 01/18/2023   Procedure: TOTAL HIP ARTHROPLASTY;  Surgeon: Mardee Lynwood SQUIBB, MD;  Location: [facility];  Service: Orthopedics;  Laterality: Right;   TOTAL KNEE ARTHROPLASTY Right 2007    Family History  Problem Relation Age of Onset   Heart attack Mother 38       required open heart surgery. Onset age is unknown.   Stroke Father    Diabetes Father    Alcoholism Brother    Breast cancer Neg Hx     Allergies[1]     Latest Ref Rng & Units 03/17/2024    4:29 AM 03/16/2024    3:20 AM 03/15/2024    4:24 AM  CBC  WBC 4.0 - 10.5 K/uL 8.1  9.1  8.0    Hemoglobin 12.0 - 15.0 g/dL 9.7  89.9  10.9   Hematocrit 36.0 - 46.0 % 29.6  30.3  33.4   Platelets 150 - 400 K/uL 171  177  178       CMP     Component Value Date/Time   NA 141 03/17/2024 0429   K 4.1 03/17/2024 0429   CL 103 03/17/2024 0429   CO2 27 03/17/2024 0429   GLUCOSE 118 (H) 03/17/2024 0429   BUN 21 03/17/2024 0429   CREATININE 0.98 03/17/2024 0429   CALCIUM  9.3 03/17/2024 0429   PROT 7.3 03/14/2024 1524   ALBUMIN 3.0 (L) 03/17/2024 0429   AST 38 03/14/2024 1524   ALT 10 03/14/2024 1524   ALKPHOS 107 03/14/2024 1524   BILITOT 1.3 (H) 03/14/2024 1524   GFRNONAA 58 (L) 03/17/2024 0429     No results found.     Assessment & Plan:   1. Bilateral carotid artery stenosis (Primary) Bilateral carotid artery stenosis status post stenting Status post bilateral carotid artery stenting with stable stents and 1-39% residual stenosis. No significant progression. Normal subclavian flow. Dominant left vertebral artery with antegrade flow; right vertebral artery with high resistance flow due to chronic hypoplasia or occlusion. Asymptomatic with stable vascular status. - Extended vascular follow-up interval to six months. - Plan for annual follow-up if stable. - Provided reassurance regarding vertebral artery findings and lack of need for intervention. - Advised continued home monitoring of weight and symptoms. - Recommended earlier follow-up with primary care for evaluation of cough and dyspnea. - VAS US  CAROTID; Future 2. Primary hypertension Continue antihypertensive medications as already ordered, these medications have been reviewed and there are no changes at this time.  3. Controlled type 2 diabetes mellitus with hyperglycemia, without long-term current use of insulin  (HCC) Continue hypoglycemic medications as already ordered, these medications have been reviewed and there are no changes at this time.  Hgb A1C to be monitored as already arranged by primary service    Assessment and Plan Assessment & Plan      Medications Ordered Prior to Encounter[2]  There are no Patient Instructions on file for this visit. Return in about 6 months (around 01/30/2025) for Carotid with JD/FB.   Orvin FORBES Daring, NP      [1]  Allergies Allergen Reactions   Celebrex [Celecoxib] Diarrhea   Penicillins Itching    IgE = 154 (WNL) on 01/05/2023   Relafen [Nabumetone]     Palpitation    Sulfa Antibiotics    Chlorhexidine  Rash   Lovastatin Diarrhea   Tape Rash  [2]  Current Outpatient Medications on File Prior to Visit  Medication Sig Dispense Refill   acetaminophen  (TYLENOL ) 325 MG tablet Take 2 tablets (650 mg total) by mouth every 6 (six) hours as needed for mild pain (or Fever >/= 101).     allopurinol  (ZYLOPRIM ) 300 MG tablet Take 1 tablet (300 mg total) by mouth daily. 30 tablet 0   ALPRAZolam  (XANAX ) 0.25 MG tablet Take 1 tablet (0.25 mg total) by mouth 2 (two) times daily as needed for anxiety. 5 tablet 0   apixaban  (ELIQUIS ) 5 MG TABS tablet Take 1 tablet (5 mg total) by mouth 2 (two) times daily. 180 tablet 0   atorvastatin  (LIPITOR ) 80 MG tablet Take 1 tablet (80 mg total) by mouth daily. 30 tablet 11   citalopram  (CELEXA ) 20 MG tablet Take 1 tablet (20 mg total) by mouth daily. 30 tablet 0   clopidogrel  (PLAVIX ) 75 MG tablet TAKE 1 TABLET BY MOUTH DAILY 90 tablet 3  levETIRAcetam  (KEPPRA ) 500 MG tablet Take 1 tablet (500 mg total) by mouth 2 (two) times daily. 60 tablet 0   losartan  (COZAAR ) 50 MG tablet Take 1 tablet (50 mg total) by mouth daily for 25 days. 30 tablet 0   metoprolol  tartrate (LOPRESSOR ) 25 MG tablet Take 0.5 tablets (12.5 mg total) by mouth 2 (two) times daily.     nystatin  (MYCOSTATIN /NYSTOP ) powder Apply 1 Application topically 3 (three) times daily. 15 g 0   potassium chloride  (KLOR-CON ) 10 MEQ tablet Take 1 tablet (10 mEq total) by mouth daily. 30 tablet 0   torsemide  (DEMADEX ) 20 MG tablet Take 1 tablet (20 mg total) by  mouth daily. 30 tablet 0   traMADol  (ULTRAM ) 50 MG tablet Take 100 mg by mouth.     melatonin 5 MG TABS Take 1 tablet (5 mg total) by mouth at bedtime. (Patient not taking: Reported on 08/02/2024) 30 tablet 0   No current facility-administered medications on file prior to visit.   "

## 2025-01-30 ENCOUNTER — Encounter (INDEPENDENT_AMBULATORY_CARE_PROVIDER_SITE_OTHER)

## 2025-01-30 ENCOUNTER — Ambulatory Visit (INDEPENDENT_AMBULATORY_CARE_PROVIDER_SITE_OTHER): Admitting: Nurse Practitioner
